# Patient Record
Sex: Female | Born: 1935 | Race: White | Hispanic: No | Smoking: Never smoker
Health system: Southern US, Community
[De-identification: ages and names within clinical notes are randomized; demographics above are authoritative.]

## PROBLEM LIST (undated history)

## (undated) DIAGNOSIS — F329 Major depressive disorder, single episode, unspecified: Secondary | ICD-10-CM

## (undated) DIAGNOSIS — I5032 Chronic diastolic (congestive) heart failure: Secondary | ICD-10-CM

## (undated) DIAGNOSIS — F32A Depression, unspecified: Secondary | ICD-10-CM

## (undated) DIAGNOSIS — K8020 Calculus of gallbladder without cholecystitis without obstruction: Secondary | ICD-10-CM

## (undated) DIAGNOSIS — G039 Meningitis, unspecified: Secondary | ICD-10-CM

## (undated) DIAGNOSIS — E785 Hyperlipidemia, unspecified: Secondary | ICD-10-CM

## (undated) DIAGNOSIS — I482 Chronic atrial fibrillation, unspecified: Secondary | ICD-10-CM

## (undated) DIAGNOSIS — G4733 Obstructive sleep apnea (adult) (pediatric): Secondary | ICD-10-CM

## (undated) DIAGNOSIS — I10 Essential (primary) hypertension: Secondary | ICD-10-CM

## (undated) DIAGNOSIS — K219 Gastro-esophageal reflux disease without esophagitis: Secondary | ICD-10-CM

## (undated) DIAGNOSIS — D509 Iron deficiency anemia, unspecified: Secondary | ICD-10-CM

## (undated) DIAGNOSIS — I2510 Atherosclerotic heart disease of native coronary artery without angina pectoris: Secondary | ICD-10-CM

## (undated) DIAGNOSIS — Z9289 Personal history of other medical treatment: Secondary | ICD-10-CM

## (undated) DIAGNOSIS — J449 Chronic obstructive pulmonary disease, unspecified: Secondary | ICD-10-CM

## (undated) DIAGNOSIS — R0902 Hypoxemia: Secondary | ICD-10-CM

## (undated) DIAGNOSIS — M109 Gout, unspecified: Secondary | ICD-10-CM

## (undated) DIAGNOSIS — B029 Zoster without complications: Secondary | ICD-10-CM

## (undated) DIAGNOSIS — Z9981 Dependence on supplemental oxygen: Secondary | ICD-10-CM

## (undated) DIAGNOSIS — I714 Abdominal aortic aneurysm, without rupture, unspecified: Secondary | ICD-10-CM

## (undated) DIAGNOSIS — Z86718 Personal history of other venous thrombosis and embolism: Secondary | ICD-10-CM

## (undated) DIAGNOSIS — N182 Chronic kidney disease, stage 2 (mild): Secondary | ICD-10-CM

## (undated) DIAGNOSIS — I2699 Other pulmonary embolism without acute cor pulmonale: Secondary | ICD-10-CM

## (undated) DIAGNOSIS — J849 Interstitial pulmonary disease, unspecified: Secondary | ICD-10-CM

## (undated) HISTORY — DX: Personal history of other venous thrombosis and embolism: Z86.718

## (undated) HISTORY — DX: Meningitis, unspecified: G03.9

## (undated) HISTORY — DX: Other pulmonary embolism without acute cor pulmonale: I26.99

## (undated) HISTORY — DX: Abdominal aortic aneurysm, without rupture: I71.4

## (undated) HISTORY — DX: Calculus of gallbladder without cholecystitis without obstruction: K80.20

## (undated) HISTORY — DX: Chronic atrial fibrillation, unspecified: I48.20

## (undated) HISTORY — DX: Gastro-esophageal reflux disease without esophagitis: K21.9

## (undated) HISTORY — DX: Essential (primary) hypertension: I10

## (undated) HISTORY — DX: Abdominal aortic aneurysm, without rupture, unspecified: I71.40

## (undated) HISTORY — DX: Morbid (severe) obesity due to excess calories: E66.01

## (undated) HISTORY — DX: Atherosclerotic heart disease of native coronary artery without angina pectoris: I25.10

## (undated) HISTORY — DX: Zoster without complications: B02.9

## (undated) HISTORY — DX: Hyperlipidemia, unspecified: E78.5

## (undated) HISTORY — PX: CATARACT EXTRACTION W/ INTRAOCULAR LENS  IMPLANT, BILATERAL: SHX1307

---
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
## 2006-05-13 DIAGNOSIS — Z86718 Personal history of other venous thrombosis and embolism: Secondary | ICD-10-CM

## 2006-05-13 DIAGNOSIS — I2699 Other pulmonary embolism without acute cor pulmonale: Secondary | ICD-10-CM

## 2006-05-13 HISTORY — DX: Other pulmonary embolism without acute cor pulmonale: I26.99

## 2006-05-13 HISTORY — DX: Personal history of other venous thrombosis and embolism: Z86.718

## 2006-07-28 ENCOUNTER — Ambulatory Visit: Payer: Self-pay | Admitting: Internal Medicine

## 2006-08-07 ENCOUNTER — Encounter: Payer: Self-pay | Admitting: Pulmonary Disease

## 2006-08-07 ENCOUNTER — Ambulatory Visit: Payer: Self-pay | Admitting: Internal Medicine

## 2006-11-01 ENCOUNTER — Ambulatory Visit: Payer: Self-pay | Admitting: *Deleted

## 2006-11-01 ENCOUNTER — Emergency Department (HOSPITAL_COMMUNITY): Admission: EM | Admit: 2006-11-01 | Discharge: 2006-11-01 | Payer: Self-pay | Admitting: Emergency Medicine

## 2006-11-02 ENCOUNTER — Ambulatory Visit: Payer: Self-pay | Admitting: Internal Medicine

## 2006-11-02 ENCOUNTER — Ambulatory Visit: Payer: Self-pay | Admitting: Cardiology

## 2006-11-02 ENCOUNTER — Inpatient Hospital Stay (HOSPITAL_COMMUNITY): Admission: EM | Admit: 2006-11-02 | Discharge: 2006-11-13 | Payer: Self-pay | Admitting: Emergency Medicine

## 2006-11-03 ENCOUNTER — Ambulatory Visit: Payer: Self-pay | Admitting: Vascular Surgery

## 2006-11-03 ENCOUNTER — Encounter (INDEPENDENT_AMBULATORY_CARE_PROVIDER_SITE_OTHER): Payer: Self-pay | Admitting: Internal Medicine

## 2006-11-04 ENCOUNTER — Encounter: Payer: Self-pay | Admitting: Cardiology

## 2006-11-17 ENCOUNTER — Ambulatory Visit: Payer: Self-pay | Admitting: Cardiovascular Disease

## 2006-11-21 ENCOUNTER — Telehealth (INDEPENDENT_AMBULATORY_CARE_PROVIDER_SITE_OTHER): Payer: Self-pay | Admitting: *Deleted

## 2006-11-21 ENCOUNTER — Ambulatory Visit: Payer: Self-pay | Admitting: Cardiology

## 2006-11-25 ENCOUNTER — Ambulatory Visit: Payer: Self-pay | Admitting: Internal Medicine

## 2006-11-25 DIAGNOSIS — G4733 Obstructive sleep apnea (adult) (pediatric): Secondary | ICD-10-CM

## 2006-11-25 DIAGNOSIS — I2699 Other pulmonary embolism without acute cor pulmonale: Secondary | ICD-10-CM

## 2006-11-25 DIAGNOSIS — I10 Essential (primary) hypertension: Secondary | ICD-10-CM

## 2006-11-25 DIAGNOSIS — I480 Paroxysmal atrial fibrillation: Secondary | ICD-10-CM

## 2006-11-25 DIAGNOSIS — T81718A Complication of other artery following a procedure, not elsewhere classified, initial encounter: Secondary | ICD-10-CM

## 2006-11-25 DIAGNOSIS — I749 Embolism and thrombosis of unspecified artery: Secondary | ICD-10-CM | POA: Insufficient documentation

## 2006-11-25 LAB — CONVERTED CEMR LAB: Prothrombin Time: 21.1 s

## 2006-11-26 DIAGNOSIS — Z87898 Personal history of other specified conditions: Secondary | ICD-10-CM

## 2006-11-26 DIAGNOSIS — Z8669 Personal history of other diseases of the nervous system and sense organs: Secondary | ICD-10-CM | POA: Insufficient documentation

## 2006-11-27 ENCOUNTER — Ambulatory Visit: Payer: Self-pay | Admitting: Cardiology

## 2006-12-03 ENCOUNTER — Ambulatory Visit: Payer: Self-pay | Admitting: Cardiology

## 2006-12-03 ENCOUNTER — Ambulatory Visit: Payer: Self-pay | Admitting: *Deleted

## 2006-12-03 LAB — CONVERTED CEMR LAB: INR: 2.2

## 2006-12-09 ENCOUNTER — Ambulatory Visit: Payer: Self-pay | Admitting: *Deleted

## 2006-12-09 LAB — CONVERTED CEMR LAB
INR: 1.8
Prothrombin Time: 16.7 s

## 2006-12-16 ENCOUNTER — Ambulatory Visit: Payer: Self-pay | Admitting: Internal Medicine

## 2006-12-16 LAB — CONVERTED CEMR LAB: Prothrombin Time: 15.1 s

## 2006-12-22 ENCOUNTER — Ambulatory Visit: Payer: Self-pay | Admitting: Cardiology

## 2006-12-23 ENCOUNTER — Ambulatory Visit: Payer: Self-pay | Admitting: Internal Medicine

## 2006-12-24 LAB — CONVERTED CEMR LAB: INR: 2.8

## 2006-12-26 LAB — CONVERTED CEMR LAB
Bilirubin Urine: NEGATIVE
Creatinine, Ser: 1.1 mg/dL (ref 0.4–1.2)
Crystals: NEGATIVE
GFR calc Af Amer: 63 mL/min
GFR calc non Af Amer: 52 mL/min
Hemoglobin, Urine: NEGATIVE
Ketones, ur: NEGATIVE mg/dL
pH: 6 (ref 5.0–8.0)

## 2006-12-30 ENCOUNTER — Ambulatory Visit: Payer: Self-pay | Admitting: *Deleted

## 2006-12-30 ENCOUNTER — Ambulatory Visit: Payer: Self-pay | Admitting: Family Medicine

## 2006-12-30 LAB — CONVERTED CEMR LAB: Prothrombin Time: 19.9 s

## 2007-01-06 ENCOUNTER — Ambulatory Visit: Payer: Self-pay | Admitting: *Deleted

## 2007-01-06 LAB — CONVERTED CEMR LAB: Prothrombin Time: 20.4 s

## 2007-01-07 ENCOUNTER — Telehealth (INDEPENDENT_AMBULATORY_CARE_PROVIDER_SITE_OTHER): Payer: Self-pay | Admitting: *Deleted

## 2007-01-12 HISTORY — PX: CARDIOVERSION: SHX1299

## 2007-01-19 ENCOUNTER — Ambulatory Visit: Payer: Self-pay | Admitting: *Deleted

## 2007-01-19 LAB — CONVERTED CEMR LAB
INR: 2.3
Prothrombin Time: 18.5 s

## 2007-01-26 ENCOUNTER — Ambulatory Visit: Payer: Self-pay | Admitting: *Deleted

## 2007-01-26 LAB — CONVERTED CEMR LAB: Prothrombin Time: 20.9 s

## 2007-02-02 ENCOUNTER — Ambulatory Visit: Payer: Self-pay | Admitting: *Deleted

## 2007-02-02 LAB — CONVERTED CEMR LAB
INR: 2.4
Prothrombin Time: 18.7 s

## 2007-02-04 ENCOUNTER — Ambulatory Visit (HOSPITAL_COMMUNITY): Admission: RE | Admit: 2007-02-04 | Discharge: 2007-02-04 | Payer: Self-pay | Admitting: Cardiology

## 2007-02-04 ENCOUNTER — Ambulatory Visit: Payer: Self-pay | Admitting: Cardiology

## 2007-02-09 ENCOUNTER — Ambulatory Visit: Payer: Self-pay | Admitting: *Deleted

## 2007-02-09 LAB — CONVERTED CEMR LAB: INR: 2.4

## 2007-02-20 ENCOUNTER — Ambulatory Visit: Payer: Self-pay | Admitting: Cardiology

## 2007-02-23 ENCOUNTER — Ambulatory Visit: Payer: Self-pay | Admitting: Internal Medicine

## 2007-03-03 ENCOUNTER — Ambulatory Visit: Payer: Self-pay | Admitting: Internal Medicine

## 2007-03-23 ENCOUNTER — Ambulatory Visit: Payer: Self-pay | Admitting: *Deleted

## 2007-03-23 LAB — CONVERTED CEMR LAB: Prothrombin Time: 17.5 s

## 2007-04-06 ENCOUNTER — Ambulatory Visit: Payer: Self-pay | Admitting: *Deleted

## 2007-04-06 LAB — CONVERTED CEMR LAB: INR: 3.9

## 2007-04-20 ENCOUNTER — Ambulatory Visit: Payer: Self-pay | Admitting: *Deleted

## 2007-04-20 LAB — CONVERTED CEMR LAB
INR: 2.5
Prothrombin Time: 19.3 s

## 2007-04-29 ENCOUNTER — Ambulatory Visit: Payer: Self-pay | Admitting: Internal Medicine

## 2007-05-18 ENCOUNTER — Ambulatory Visit: Payer: Self-pay | Admitting: *Deleted

## 2007-05-18 LAB — CONVERTED CEMR LAB
INR: 4.3
Prothrombin Time: 25 s

## 2007-05-27 ENCOUNTER — Ambulatory Visit: Payer: Self-pay | Admitting: Internal Medicine

## 2007-05-28 LAB — CONVERTED CEMR LAB: Prothrombin Time: 18.7 s — ABNORMAL HIGH (ref 10.9–13.3)

## 2007-06-01 ENCOUNTER — Telehealth (INDEPENDENT_AMBULATORY_CARE_PROVIDER_SITE_OTHER): Payer: Self-pay | Admitting: *Deleted

## 2007-06-01 ENCOUNTER — Ambulatory Visit: Payer: Self-pay | Admitting: Cardiology

## 2007-06-03 ENCOUNTER — Ambulatory Visit: Payer: Self-pay | Admitting: Cardiology

## 2007-06-03 ENCOUNTER — Ambulatory Visit: Payer: Self-pay | Admitting: Internal Medicine

## 2007-06-03 ENCOUNTER — Ambulatory Visit: Payer: Self-pay

## 2007-06-03 DIAGNOSIS — F322 Major depressive disorder, single episode, severe without psychotic features: Secondary | ICD-10-CM | POA: Insufficient documentation

## 2007-06-11 ENCOUNTER — Ambulatory Visit: Payer: Self-pay | Admitting: Internal Medicine

## 2007-06-11 LAB — CONVERTED CEMR LAB: INR: 5.1

## 2007-06-15 ENCOUNTER — Ambulatory Visit: Payer: Self-pay | Admitting: Ophthalmology

## 2007-06-24 ENCOUNTER — Ambulatory Visit: Payer: Self-pay | Admitting: Internal Medicine

## 2007-06-26 ENCOUNTER — Ambulatory Visit: Payer: Self-pay | Admitting: Internal Medicine

## 2007-06-26 LAB — CONVERTED CEMR LAB: Prothrombin Time: 31.4 s

## 2007-06-29 ENCOUNTER — Ambulatory Visit: Payer: Self-pay | Admitting: Internal Medicine

## 2007-06-29 LAB — CONVERTED CEMR LAB
INR: 4.6 — ABNORMAL HIGH (ref 0.8–1.0)
Prothrombin Time: 27.5 s — ABNORMAL HIGH (ref 10.9–13.3)
Prothrombin Time: 17.5 s

## 2007-06-30 ENCOUNTER — Ambulatory Visit: Payer: Self-pay | Admitting: Ophthalmology

## 2007-07-06 ENCOUNTER — Ambulatory Visit: Payer: Self-pay | Admitting: Internal Medicine

## 2007-07-06 LAB — CONVERTED CEMR LAB
INR: 2.5
Prothrombin Time: 19.2 s

## 2007-07-17 ENCOUNTER — Encounter (INDEPENDENT_AMBULATORY_CARE_PROVIDER_SITE_OTHER): Payer: Self-pay | Admitting: *Deleted

## 2007-07-29 ENCOUNTER — Ambulatory Visit: Payer: Self-pay | Admitting: *Deleted

## 2007-08-05 ENCOUNTER — Ambulatory Visit: Payer: Self-pay | Admitting: Internal Medicine

## 2007-08-12 ENCOUNTER — Ambulatory Visit: Payer: Self-pay | Admitting: Internal Medicine

## 2007-08-12 LAB — CONVERTED CEMR LAB: Prothrombin Time: 24.1 s

## 2007-08-19 ENCOUNTER — Ambulatory Visit: Payer: Self-pay | Admitting: Internal Medicine

## 2007-08-19 LAB — CONVERTED CEMR LAB: INR: 3.3

## 2007-08-27 ENCOUNTER — Ambulatory Visit: Payer: Self-pay | Admitting: Cardiology

## 2007-09-02 ENCOUNTER — Ambulatory Visit: Payer: Self-pay | Admitting: Internal Medicine

## 2007-09-02 LAB — CONVERTED CEMR LAB: INR: 2.9

## 2007-10-01 ENCOUNTER — Ambulatory Visit: Payer: Self-pay | Admitting: Internal Medicine

## 2007-10-01 LAB — CONVERTED CEMR LAB
INR: 4.5
Prothrombin Time: 25.6 s

## 2007-10-07 ENCOUNTER — Ambulatory Visit: Payer: Self-pay | Admitting: Internal Medicine

## 2007-10-07 LAB — CONVERTED CEMR LAB: Prothrombin Time: 21.6 s

## 2007-10-21 ENCOUNTER — Ambulatory Visit: Payer: Self-pay | Admitting: *Deleted

## 2007-10-28 ENCOUNTER — Ambulatory Visit: Payer: Self-pay | Admitting: Internal Medicine

## 2007-10-30 LAB — CONVERTED CEMR LAB

## 2007-11-04 ENCOUNTER — Ambulatory Visit: Payer: Self-pay | Admitting: Internal Medicine

## 2007-11-04 LAB — CONVERTED CEMR LAB
INR: 3.1
Prothrombin Time: 21.3 s

## 2007-12-02 ENCOUNTER — Ambulatory Visit: Payer: Self-pay | Admitting: Family Medicine

## 2007-12-02 DIAGNOSIS — J309 Allergic rhinitis, unspecified: Secondary | ICD-10-CM

## 2007-12-04 ENCOUNTER — Ambulatory Visit: Payer: Self-pay | Admitting: Family Medicine

## 2007-12-04 LAB — CONVERTED CEMR LAB: Prothrombin Time: 24 s

## 2007-12-18 ENCOUNTER — Ambulatory Visit: Payer: Self-pay | Admitting: Family Medicine

## 2007-12-18 LAB — CONVERTED CEMR LAB
INR: 2.5
Prothrombin Time: 19.3 s

## 2008-01-11 ENCOUNTER — Encounter: Payer: Self-pay | Admitting: Family Medicine

## 2008-01-13 ENCOUNTER — Ambulatory Visit: Payer: Self-pay | Admitting: Family Medicine

## 2008-02-08 ENCOUNTER — Telehealth: Payer: Self-pay | Admitting: Internal Medicine

## 2008-02-10 ENCOUNTER — Ambulatory Visit: Payer: Self-pay | Admitting: Family Medicine

## 2008-02-10 LAB — CONVERTED CEMR LAB
INR: 1.8
Prothrombin Time: 16.6 s

## 2008-02-17 ENCOUNTER — Telehealth: Payer: Self-pay | Admitting: Internal Medicine

## 2008-03-02 ENCOUNTER — Ambulatory Visit: Payer: Self-pay | Admitting: Family Medicine

## 2008-03-02 LAB — CONVERTED CEMR LAB: INR: 3.2

## 2008-03-09 ENCOUNTER — Ambulatory Visit: Payer: Self-pay | Admitting: Family Medicine

## 2008-03-09 DIAGNOSIS — B0229 Other postherpetic nervous system involvement: Secondary | ICD-10-CM

## 2008-03-09 LAB — CONVERTED CEMR LAB
ALT: 32 units/L (ref 0–35)
AST: 26 units/L (ref 0–37)
Albumin: 3.5 g/dL (ref 3.5–5.2)
Alkaline Phosphatase: 80 units/L (ref 39–117)
BUN: 19 mg/dL (ref 6–23)
Chloride: 104 meq/L (ref 96–112)
Cholesterol: 137 mg/dL (ref 0–200)
Glucose, Bld: 90 mg/dL (ref 70–99)
Potassium: 4.3 meq/L (ref 3.5–5.1)
Total Protein: 7.1 g/dL (ref 6.0–8.3)
Triglycerides: 95 mg/dL (ref 0–149)
VLDL: 19 mg/dL (ref 0–40)

## 2008-03-22 ENCOUNTER — Ambulatory Visit: Payer: Self-pay | Admitting: Family Medicine

## 2008-03-25 ENCOUNTER — Encounter (INDEPENDENT_AMBULATORY_CARE_PROVIDER_SITE_OTHER): Payer: Self-pay | Admitting: *Deleted

## 2008-04-01 ENCOUNTER — Ambulatory Visit: Payer: Self-pay | Admitting: Family Medicine

## 2008-04-04 ENCOUNTER — Encounter: Payer: Self-pay | Admitting: Internal Medicine

## 2008-04-14 ENCOUNTER — Ambulatory Visit: Payer: Self-pay | Admitting: Family Medicine

## 2008-04-29 ENCOUNTER — Ambulatory Visit: Payer: Self-pay | Admitting: Family Medicine

## 2008-05-04 ENCOUNTER — Ambulatory Visit: Payer: Self-pay | Admitting: Family Medicine

## 2008-05-09 ENCOUNTER — Telehealth: Payer: Self-pay | Admitting: Family Medicine

## 2008-05-10 ENCOUNTER — Telehealth (INDEPENDENT_AMBULATORY_CARE_PROVIDER_SITE_OTHER): Payer: Self-pay | Admitting: Internal Medicine

## 2008-05-11 ENCOUNTER — Telehealth: Payer: Self-pay | Admitting: Family Medicine

## 2008-05-17 ENCOUNTER — Encounter: Payer: Self-pay | Admitting: Family Medicine

## 2008-05-18 ENCOUNTER — Ambulatory Visit: Payer: Self-pay | Admitting: Family Medicine

## 2008-05-27 ENCOUNTER — Ambulatory Visit: Payer: Self-pay | Admitting: Family Medicine

## 2008-05-27 LAB — CONVERTED CEMR LAB
INR: 4.3
Prothrombin Time: 25.1 s

## 2008-06-10 ENCOUNTER — Ambulatory Visit: Payer: Self-pay | Admitting: Family Medicine

## 2008-06-10 LAB — CONVERTED CEMR LAB
INR: 3.6
Prothrombin Time: 23 s

## 2008-06-24 ENCOUNTER — Ambulatory Visit: Payer: Self-pay | Admitting: Family Medicine

## 2008-06-24 LAB — CONVERTED CEMR LAB: Prothrombin Time: 22.8 s

## 2008-07-04 ENCOUNTER — Ambulatory Visit: Payer: Self-pay | Admitting: Family Medicine

## 2008-07-20 ENCOUNTER — Ambulatory Visit: Payer: Self-pay | Admitting: Internal Medicine

## 2008-07-22 ENCOUNTER — Encounter: Payer: Self-pay | Admitting: Internal Medicine

## 2008-07-22 ENCOUNTER — Ambulatory Visit: Payer: Self-pay | Admitting: Cardiology

## 2008-07-22 LAB — CONVERTED CEMR LAB
BUN: 20 mg/dL (ref 6–23)
Calcium: 9.2 mg/dL (ref 8.4–10.5)
Chloride: 103 meq/L (ref 96–112)
Creatinine, Ser: 1.07 mg/dL (ref 0.40–1.20)
Pro B Natriuretic peptide (BNP): 120.9 pg/mL — ABNORMAL HIGH (ref 0.0–100.0)

## 2008-07-25 ENCOUNTER — Telehealth: Payer: Self-pay | Admitting: Family Medicine

## 2008-07-27 ENCOUNTER — Ambulatory Visit: Payer: Self-pay | Admitting: Internal Medicine

## 2008-07-27 LAB — CONVERTED CEMR LAB
Calcium: 9.3 mg/dL (ref 8.4–10.5)
Glucose, Bld: 98 mg/dL (ref 70–99)
Pro B Natriuretic peptide (BNP): 89.6 pg/mL (ref 0.0–100.0)
Sodium: 141 meq/L (ref 135–145)

## 2008-07-28 ENCOUNTER — Ambulatory Visit: Payer: Self-pay | Admitting: Internal Medicine

## 2008-07-28 ENCOUNTER — Encounter: Payer: Self-pay | Admitting: Internal Medicine

## 2008-08-03 ENCOUNTER — Ambulatory Visit: Payer: Self-pay | Admitting: Cardiology

## 2008-08-03 LAB — CONVERTED CEMR LAB
Chloride: 101 meq/L (ref 96–112)
Creatinine, Ser: 1.22 mg/dL — ABNORMAL HIGH (ref 0.40–1.20)

## 2008-08-05 ENCOUNTER — Ambulatory Visit: Payer: Self-pay | Admitting: Family Medicine

## 2008-08-05 ENCOUNTER — Telehealth: Payer: Self-pay | Admitting: Family Medicine

## 2008-08-05 LAB — CONVERTED CEMR LAB
INR: 3.1
Prothrombin Time: 21.3 s

## 2008-08-24 ENCOUNTER — Ambulatory Visit: Payer: Self-pay | Admitting: Internal Medicine

## 2008-08-24 ENCOUNTER — Encounter: Payer: Self-pay | Admitting: Internal Medicine

## 2008-09-02 ENCOUNTER — Ambulatory Visit: Payer: Self-pay | Admitting: Family Medicine

## 2008-09-02 LAB — CONVERTED CEMR LAB
INR: 4.6
Prothrombin Time: 26.1 s

## 2008-09-08 ENCOUNTER — Encounter: Payer: Self-pay | Admitting: Internal Medicine

## 2008-09-08 ENCOUNTER — Ambulatory Visit: Payer: Self-pay | Admitting: Cardiology

## 2008-09-08 LAB — CONVERTED CEMR LAB
BUN: 18 mg/dL (ref 6–23)
Calcium: 9.2 mg/dL (ref 8.4–10.5)
Glucose, Bld: 93 mg/dL (ref 70–99)
Pro B Natriuretic peptide (BNP): 155 pg/mL — ABNORMAL HIGH (ref 0.0–100.0)
Sodium: 141 meq/L (ref 135–145)

## 2008-09-16 ENCOUNTER — Ambulatory Visit: Payer: Self-pay | Admitting: Family Medicine

## 2008-09-30 ENCOUNTER — Ambulatory Visit: Payer: Self-pay | Admitting: Family Medicine

## 2008-10-03 LAB — CONVERTED CEMR LAB: Prothrombin Time: 22.8 s

## 2008-10-14 ENCOUNTER — Ambulatory Visit: Payer: Self-pay | Admitting: Family Medicine

## 2008-11-18 ENCOUNTER — Ambulatory Visit: Payer: Self-pay | Admitting: Family Medicine

## 2008-11-18 LAB — CONVERTED CEMR LAB
INR: 2.1
Prothrombin Time: 17.8 s

## 2008-12-01 ENCOUNTER — Ambulatory Visit: Payer: Self-pay | Admitting: Internal Medicine

## 2008-12-01 ENCOUNTER — Encounter (INDEPENDENT_AMBULATORY_CARE_PROVIDER_SITE_OTHER): Payer: Self-pay | Admitting: *Deleted

## 2008-12-05 ENCOUNTER — Ambulatory Visit: Payer: Self-pay | Admitting: Internal Medicine

## 2008-12-05 ENCOUNTER — Inpatient Hospital Stay (HOSPITAL_BASED_OUTPATIENT_CLINIC_OR_DEPARTMENT_OTHER): Admission: RE | Admit: 2008-12-05 | Discharge: 2008-12-05 | Payer: Self-pay | Admitting: Internal Medicine

## 2008-12-07 LAB — CONVERTED CEMR LAB
BUN: 20 mg/dL (ref 6–23)
Chloride: 103 meq/L (ref 96–112)
Glucose, Bld: 90 mg/dL (ref 70–99)
Hemoglobin: 15.9 g/dL — ABNORMAL HIGH (ref 12.0–15.0)
Potassium: 4.7 meq/L (ref 3.5–5.3)
RBC: 4.75 M/uL (ref 3.87–5.11)
WBC: 12.3 10*3/uL — ABNORMAL HIGH (ref 4.0–10.5)

## 2008-12-23 ENCOUNTER — Ambulatory Visit: Payer: Self-pay | Admitting: Internal Medicine

## 2008-12-23 ENCOUNTER — Telehealth (INDEPENDENT_AMBULATORY_CARE_PROVIDER_SITE_OTHER): Payer: Self-pay | Admitting: *Deleted

## 2008-12-23 DIAGNOSIS — I714 Abdominal aortic aneurysm, without rupture: Secondary | ICD-10-CM

## 2008-12-28 ENCOUNTER — Ambulatory Visit: Payer: Self-pay | Admitting: Family Medicine

## 2009-01-04 ENCOUNTER — Ambulatory Visit: Payer: Self-pay | Admitting: Family Medicine

## 2009-01-04 ENCOUNTER — Telehealth: Payer: Self-pay | Admitting: Internal Medicine

## 2009-01-04 LAB — CONVERTED CEMR LAB
INR: 3.9
Prothrombin Time: 24 s

## 2009-01-10 ENCOUNTER — Encounter: Payer: Self-pay | Admitting: Internal Medicine

## 2009-01-10 ENCOUNTER — Ambulatory Visit: Payer: Self-pay

## 2009-01-10 ENCOUNTER — Ambulatory Visit: Payer: Self-pay | Admitting: Cardiology

## 2009-01-11 ENCOUNTER — Ambulatory Visit: Payer: Self-pay | Admitting: Family Medicine

## 2009-01-11 LAB — CONVERTED CEMR LAB: INR: 2.6

## 2009-01-18 ENCOUNTER — Ambulatory Visit: Payer: Self-pay | Admitting: Family Medicine

## 2009-01-18 LAB — CONVERTED CEMR LAB: Prothrombin Time: 18.5 s

## 2009-01-25 ENCOUNTER — Ambulatory Visit: Payer: Self-pay | Admitting: Family Medicine

## 2009-01-25 LAB — CONVERTED CEMR LAB: INR: 2.6

## 2009-02-02 ENCOUNTER — Ambulatory Visit: Payer: Self-pay | Admitting: Internal Medicine

## 2009-02-02 ENCOUNTER — Encounter: Payer: Self-pay | Admitting: Internal Medicine

## 2009-02-02 DIAGNOSIS — I5032 Chronic diastolic (congestive) heart failure: Secondary | ICD-10-CM

## 2009-02-06 LAB — CONVERTED CEMR LAB
CO2: 22 meq/L (ref 19–32)
Chloride: 106 meq/L (ref 96–112)
Creatinine, Ser: 0.97 mg/dL (ref 0.40–1.20)
HCT: 45.7 % (ref 36.0–46.0)
Hemoglobin: 15.3 g/dL — ABNORMAL HIGH (ref 12.0–15.0)
MCHC: 33.5 g/dL (ref 30.0–36.0)
Potassium: 4.5 meq/L (ref 3.5–5.3)
RBC: 4.53 M/uL (ref 3.87–5.11)
RDW: 14.8 % (ref 11.5–15.5)
aPTT: 38 s — ABNORMAL HIGH (ref 24–37)

## 2009-02-08 ENCOUNTER — Encounter: Payer: Self-pay | Admitting: Internal Medicine

## 2009-02-08 ENCOUNTER — Telehealth: Payer: Self-pay | Admitting: Internal Medicine

## 2009-02-08 ENCOUNTER — Ambulatory Visit: Payer: Self-pay | Admitting: Internal Medicine

## 2009-02-10 ENCOUNTER — Ambulatory Visit: Payer: Self-pay | Admitting: Internal Medicine

## 2009-02-10 ENCOUNTER — Inpatient Hospital Stay (HOSPITAL_COMMUNITY): Admission: AD | Admit: 2009-02-10 | Discharge: 2009-02-15 | Payer: Self-pay | Admitting: Internal Medicine

## 2009-02-10 ENCOUNTER — Encounter: Payer: Self-pay | Admitting: Internal Medicine

## 2009-02-14 ENCOUNTER — Encounter: Payer: Self-pay | Admitting: Internal Medicine

## 2009-02-17 ENCOUNTER — Ambulatory Visit: Payer: Self-pay | Admitting: Cardiovascular Disease

## 2009-02-17 ENCOUNTER — Encounter: Payer: Self-pay | Admitting: Internal Medicine

## 2009-02-24 ENCOUNTER — Ambulatory Visit: Payer: Self-pay | Admitting: Internal Medicine

## 2009-02-24 ENCOUNTER — Telehealth: Payer: Self-pay | Admitting: Family Medicine

## 2009-03-01 LAB — CONVERTED CEMR LAB
CO2: 20 meq/L (ref 19–32)
Calcium: 9.5 mg/dL (ref 8.4–10.5)
Chloride: 105 meq/L (ref 96–112)
Glucose, Bld: 107 mg/dL — ABNORMAL HIGH (ref 70–99)
Sodium: 141 meq/L (ref 135–145)

## 2009-03-02 ENCOUNTER — Telehealth: Payer: Self-pay | Admitting: Family Medicine

## 2009-03-02 ENCOUNTER — Ambulatory Visit: Payer: Self-pay | Admitting: Internal Medicine

## 2009-03-10 ENCOUNTER — Ambulatory Visit: Payer: Self-pay | Admitting: Family Medicine

## 2009-03-10 LAB — CONVERTED CEMR LAB: INR: 1.8

## 2009-03-13 ENCOUNTER — Encounter (INDEPENDENT_AMBULATORY_CARE_PROVIDER_SITE_OTHER): Payer: Self-pay | Admitting: *Deleted

## 2009-03-13 LAB — CONVERTED CEMR LAB
CO2: 29 meq/L (ref 19–32)
Calcium: 9.4 mg/dL (ref 8.4–10.5)
Chloride: 99 meq/L (ref 96–112)
Glucose, Bld: 77 mg/dL (ref 70–99)
Potassium: 5.2 meq/L — ABNORMAL HIGH (ref 3.5–5.1)
Sodium: 141 meq/L (ref 135–145)

## 2009-03-24 ENCOUNTER — Ambulatory Visit: Payer: Self-pay | Admitting: Family Medicine

## 2009-03-24 LAB — CONVERTED CEMR LAB
INR: 2.4
Prothrombin Time: 18.8 s

## 2009-03-27 LAB — CONVERTED CEMR LAB
CO2: 32 meq/L (ref 19–32)
Chloride: 103 meq/L (ref 96–112)
Potassium: 5.1 meq/L (ref 3.5–5.1)
Sodium: 144 meq/L (ref 135–145)

## 2009-04-04 ENCOUNTER — Ambulatory Visit: Payer: Self-pay | Admitting: Internal Medicine

## 2009-04-10 LAB — CONVERTED CEMR LAB
Calcium: 9.6 mg/dL (ref 8.4–10.5)
Chloride: 94 meq/L — ABNORMAL LOW (ref 96–112)
Creatinine, Ser: 1.3 mg/dL — ABNORMAL HIGH (ref 0.40–1.20)
Potassium: 3.3 meq/L — ABNORMAL LOW (ref 3.5–5.3)
Sodium: 139 meq/L (ref 135–145)

## 2009-04-21 ENCOUNTER — Ambulatory Visit: Payer: Self-pay | Admitting: Family Medicine

## 2009-04-21 LAB — CONVERTED CEMR LAB
INR: 2.2
Prothrombin Time: 18.2 s

## 2009-04-24 LAB — CONVERTED CEMR LAB
CO2: 32 meq/L (ref 19–32)
Calcium: 9.3 mg/dL (ref 8.4–10.5)
Creatinine, Ser: 1.1 mg/dL (ref 0.4–1.2)

## 2009-05-19 ENCOUNTER — Ambulatory Visit: Payer: Self-pay | Admitting: Family Medicine

## 2009-06-16 ENCOUNTER — Ambulatory Visit: Payer: Self-pay | Admitting: Family Medicine

## 2009-06-16 LAB — CONVERTED CEMR LAB

## 2009-06-28 ENCOUNTER — Observation Stay (HOSPITAL_COMMUNITY): Admission: EM | Admit: 2009-06-28 | Discharge: 2009-06-29 | Payer: Self-pay | Admitting: Emergency Medicine

## 2009-06-28 ENCOUNTER — Telehealth: Payer: Self-pay | Admitting: Family Medicine

## 2009-06-28 ENCOUNTER — Ambulatory Visit: Payer: Self-pay | Admitting: Internal Medicine

## 2009-07-21 ENCOUNTER — Ambulatory Visit: Payer: Self-pay | Admitting: Internal Medicine

## 2009-07-21 ENCOUNTER — Encounter: Payer: Self-pay | Admitting: Internal Medicine

## 2009-08-18 ENCOUNTER — Ambulatory Visit: Payer: Self-pay | Admitting: Family Medicine

## 2009-08-18 LAB — CONVERTED CEMR LAB: INR: 3.5

## 2009-08-31 ENCOUNTER — Ambulatory Visit: Payer: Self-pay | Admitting: Family Medicine

## 2009-08-31 LAB — CONVERTED CEMR LAB
INR: 3.3
Prothrombin Time: 39.6 s

## 2009-09-27 ENCOUNTER — Ambulatory Visit: Payer: Self-pay | Admitting: Family Medicine

## 2009-09-27 LAB — CONVERTED CEMR LAB
INR: 6.2
Prothrombin Time: 74.7 s

## 2009-10-03 ENCOUNTER — Encounter: Payer: Self-pay | Admitting: Family Medicine

## 2009-10-17 ENCOUNTER — Encounter: Payer: Self-pay | Admitting: Family Medicine

## 2009-10-31 ENCOUNTER — Encounter: Payer: Self-pay | Admitting: Family Medicine

## 2009-10-31 ENCOUNTER — Encounter (INDEPENDENT_AMBULATORY_CARE_PROVIDER_SITE_OTHER): Payer: Self-pay | Admitting: *Deleted

## 2009-11-15 ENCOUNTER — Encounter: Payer: Self-pay | Admitting: Family Medicine

## 2009-11-15 ENCOUNTER — Encounter (INDEPENDENT_AMBULATORY_CARE_PROVIDER_SITE_OTHER): Payer: Self-pay | Admitting: *Deleted

## 2009-11-29 ENCOUNTER — Encounter (INDEPENDENT_AMBULATORY_CARE_PROVIDER_SITE_OTHER): Payer: Self-pay | Admitting: *Deleted

## 2009-11-30 ENCOUNTER — Encounter: Payer: Self-pay | Admitting: Family Medicine

## 2010-01-05 ENCOUNTER — Encounter: Payer: Self-pay | Admitting: Cardiovascular Disease

## 2010-01-05 ENCOUNTER — Ambulatory Visit: Payer: Self-pay | Admitting: Cardiovascular Disease

## 2010-01-19 ENCOUNTER — Ambulatory Visit: Payer: Self-pay | Admitting: Family Medicine

## 2010-01-22 LAB — CONVERTED CEMR LAB
BUN: 33 mg/dL — ABNORMAL HIGH (ref 6–23)
Calcium: 9.5 mg/dL (ref 8.4–10.5)
Creatinine, Ser: 1.37 mg/dL — ABNORMAL HIGH (ref 0.40–1.20)
Potassium: 3.6 meq/L (ref 3.5–5.3)

## 2010-01-24 ENCOUNTER — Encounter: Payer: Self-pay | Admitting: Family Medicine

## 2010-01-24 ENCOUNTER — Telehealth: Payer: Self-pay | Admitting: Family Medicine

## 2010-01-24 ENCOUNTER — Inpatient Hospital Stay: Payer: Self-pay | Admitting: Surgery

## 2010-01-24 ENCOUNTER — Encounter: Payer: Self-pay | Admitting: Internal Medicine

## 2010-01-30 ENCOUNTER — Encounter: Payer: Self-pay | Admitting: Family Medicine

## 2010-01-30 ENCOUNTER — Encounter: Payer: Self-pay | Admitting: Cardiovascular Disease

## 2010-02-01 ENCOUNTER — Encounter: Payer: Self-pay | Admitting: Family Medicine

## 2010-02-01 ENCOUNTER — Observation Stay: Payer: Self-pay | Admitting: Internal Medicine

## 2010-02-02 ENCOUNTER — Encounter: Payer: Self-pay | Admitting: Family Medicine

## 2010-02-03 ENCOUNTER — Encounter: Payer: Self-pay | Admitting: Family Medicine

## 2010-02-05 ENCOUNTER — Encounter: Payer: Self-pay | Admitting: Family Medicine

## 2010-02-05 ENCOUNTER — Encounter: Payer: Self-pay | Admitting: Internal Medicine

## 2010-02-06 ENCOUNTER — Ambulatory Visit: Payer: Self-pay | Admitting: Internal Medicine

## 2010-02-06 ENCOUNTER — Encounter: Payer: Self-pay | Admitting: Family Medicine

## 2010-02-10 ENCOUNTER — Encounter: Payer: Self-pay | Admitting: Internal Medicine

## 2010-02-13 ENCOUNTER — Ambulatory Visit: Payer: Self-pay | Admitting: Internal Medicine

## 2010-02-16 ENCOUNTER — Telehealth: Payer: Self-pay | Admitting: Family Medicine

## 2010-02-20 ENCOUNTER — Telehealth: Payer: Self-pay | Admitting: Family Medicine

## 2010-02-23 ENCOUNTER — Telehealth: Payer: Self-pay | Admitting: Family Medicine

## 2010-02-23 ENCOUNTER — Encounter: Payer: Self-pay | Admitting: Family Medicine

## 2010-02-23 ENCOUNTER — Ambulatory Visit: Payer: Self-pay | Admitting: Surgery

## 2010-03-02 ENCOUNTER — Ambulatory Visit: Payer: Self-pay | Admitting: Family Medicine

## 2010-03-02 LAB — CONVERTED CEMR LAB
INR: 1.2
Prothrombin Time: 14.6 s

## 2010-03-13 ENCOUNTER — Ambulatory Visit: Payer: Self-pay | Admitting: Family Medicine

## 2010-03-13 ENCOUNTER — Ambulatory Visit: Payer: Self-pay | Admitting: Internal Medicine

## 2010-03-13 LAB — CONVERTED CEMR LAB: Prothrombin Time: 31.2 s

## 2010-03-16 ENCOUNTER — Encounter: Payer: Self-pay | Admitting: Family Medicine

## 2010-03-26 ENCOUNTER — Encounter: Payer: Self-pay | Admitting: Family Medicine

## 2010-03-26 ENCOUNTER — Telehealth (INDEPENDENT_AMBULATORY_CARE_PROVIDER_SITE_OTHER): Payer: Self-pay | Admitting: *Deleted

## 2010-03-29 ENCOUNTER — Ambulatory Visit: Payer: Self-pay | Admitting: Surgery

## 2010-04-04 ENCOUNTER — Ambulatory Visit: Payer: Self-pay | Admitting: Surgery

## 2010-04-06 ENCOUNTER — Ambulatory Visit: Payer: Self-pay | Admitting: Cardiology

## 2010-04-06 ENCOUNTER — Inpatient Hospital Stay: Payer: Self-pay | Admitting: Internal Medicine

## 2010-04-07 ENCOUNTER — Encounter: Payer: Self-pay | Admitting: Cardiovascular Disease

## 2010-04-12 ENCOUNTER — Ambulatory Visit: Payer: Self-pay | Admitting: Internal Medicine

## 2010-04-12 ENCOUNTER — Encounter: Payer: Self-pay | Admitting: Family Medicine

## 2010-04-17 ENCOUNTER — Encounter: Payer: Self-pay | Admitting: Family Medicine

## 2010-04-18 ENCOUNTER — Encounter: Payer: Self-pay | Admitting: Family Medicine

## 2010-04-18 ENCOUNTER — Telehealth: Payer: Self-pay | Admitting: Family Medicine

## 2010-04-22 ENCOUNTER — Encounter: Payer: Self-pay | Admitting: Family Medicine

## 2010-04-23 ENCOUNTER — Telehealth: Payer: Self-pay | Admitting: Family Medicine

## 2010-04-30 ENCOUNTER — Encounter: Payer: Self-pay | Admitting: Family Medicine

## 2010-04-30 ENCOUNTER — Telehealth (INDEPENDENT_AMBULATORY_CARE_PROVIDER_SITE_OTHER): Payer: Self-pay | Admitting: *Deleted

## 2010-05-02 ENCOUNTER — Encounter: Payer: Self-pay | Admitting: Family Medicine

## 2010-06-08 ENCOUNTER — Telehealth: Payer: Self-pay | Admitting: Family Medicine

## 2010-06-13 NOTE — Letter (Signed)
Summary: Generic Letter  [facility]  [address] [place], [place] [address]   Phone: [phone]  Fax: [phone]    11/15/2009  [facility] [place] [address] [place], [place]  [address]  Dear Ms. Guiffre,      Please draw Protime on patient today 11/15/09 (dx code 427.31 Atrial Fib) Fax results to [phone].     Thank You,    Kerby Nora MD    Liane Comber CMA (AAMA)

## 2010-06-13 NOTE — Progress Notes (Signed)
Summary: PT results  Phone Note From Other Clinic   Caller: Larita Fife with [facility] [phone] Summary of Call: Nurse called with INR/PT results from yesterday.  INR 1.1, PT 13.8. Initial call taken by: Lowella Petties CMA,  February 23, 2010 9:42 AM  Follow-up for Phone Call        Please ask pt below questions and put answers beside questions.  Also verify below regimen that this is what she is taking.  Coumadin dose missed/changed:  Patient was off for operation Abnormal Bleeding Symptoms: no Any diet changes including alcohol intake, vegetables or greens since the last visit:  yes no salt Any illnesses or hospitalizations since the last visit: yes  Any signs of clotting since the last visit:no  Previous INR:5mg  sat,sunday 2.5 everyother day Previous Regimen:  Follow-up by: Kerby Nora MD,  February 23, 2010 9:49 AM  Additional Follow-up for Phone Call Additional follow up Details #1::        Left message for nurse to return my call  havent heard from nurse but i do know that patient has been recently hospitalized and also was in [facility] for rehab Additional Follow-up by: Benny Lennert CMA Duncan Dull),  February 26, 2010 11:43 AM    Additional Follow-up for Phone Call Additional follow up Details #2::    That is exactly why I need more info.Marland KitchenMarland KitchenI don't know what dose she is on, reent changes etc. Keep calling or call pt directly. Follow-up by: Kerby Nora MD,  February 27, 2010 1:52 PM  Additional Follow-up for Phone Call Additional follow up Details #3:: Details for Additional Follow-up Action Taken: Patient says that she wants to get her pt/inr checked when she comes in on friday to see you Additional Follow-up by: Benny Lennert CMA Duncan Dull),  February 27, 2010 1:58 PM

## 2010-06-13 NOTE — Progress Notes (Signed)
Summary: prior auth needed for lyrica  Phone Note From Pharmacy   Caller: [facility] Summary of Call: Prior Berkley Harvey is needed for [employer], form is on your desk. Initial call taken by: Lowella Petties CMA,  January 24, 2010 9:13 AM

## 2010-06-13 NOTE — Letter (Signed)
Summary: [facility]   Imported By: Lester Bath 04/19/2010 09:10:53  _____________________________________________________________________  External Attachment:    Type:   Image     Comment:   External Document

## 2010-06-13 NOTE — Medication Information (Signed)
Summary: Coumadin Clinic  Anticoagulant Therapy  Managed by: Cloyde Reams, RN, BSN PCP: Kerby Nora MD Supervising MD: Gala Romney MD, Reuel Boom Indication 1: Deep venous thrombosis INR POC 2.7 INR RANGE 2.5-3.5  Dietary changes: no     Bleeding/hemorrhagic complications: no     Any changes in medication regimen? no     Any missed doses?: no       Is patient compliant with meds? yes      Comments: Pt normally has coumadin check at [facility]. Pt was in office and requested that we check it while she was here.   Allergies: 1)  ! Amoxicillin  Anticoagulation Management History:      The patient is taking warfarin and comes in today for a routine follow up visit.  Positive risk factors for bleeding include an age of 75 years or older.  The bleeding index is 'intermediate risk'.  Positive CHADS2 values include History of CHF, History of HTN, and Age > 73 years old.  Her last INR was 6.2.  Anticoagulation responsible provider: Bensimhon MD, Reuel Boom.  INR POC: 2.7.  Cuvette Lot#: [id number].  Exp: 02/2011.    Anticoagulation Management Assessment/Plan:      The patient's current anticoagulation dose is Warfarin sodium 5 mg  tabs: Per instructions: 1/2 tab alternating per the sheet with 1 whole tab..  The target INR is 2.5-3.5.  The next INR is due giving patient written rx for INR, will be out of town for 7 weeks.  Anticoagulation instructions were given to patient.  Results were reviewed/authorized by Cloyde Reams, RN, BSN.  She was notified by Benedict Needy, RN.         Prior Anticoagulation Instructions: coumadin 5 mg daily  Current Anticoagulation Instructions: INR 2.7  Continue taking 1 tab daily except for 1/2 tab on Monday, Wednesday and Friday.

## 2010-06-13 NOTE — Medication Information (Signed)
Summary: Lyrica Approved/[facility]  Lyrica Approved/[facility]   Imported By: Sherian Rein 01/31/2010 10:57:21  _____________________________________________________________________  External Attachment:    Type:   Image     Comment:   External Document

## 2010-06-13 NOTE — Medication Information (Signed)
Summary: Coumadin Clinic  Anticoagulant Therapy  Managed by: Charlena Cross, RN, BSN PCP: Kerby Nora MD Supervising MD: Gala Romney MD, Reuel Boom Indication 1: Deep venous thrombosis INR POC 3.4 INR RANGE 2.5-3.5  Dietary changes: no    Health status changes: no    Bleeding/hemorrhagic complications: no    Recent/future hospitalizations: no    Any changes in medication regimen? no    Recent/future dental: no  Any missed doses?: no       Is patient compliant with meds? yes       Allergies: 1)  ! Amoxicillin  Anticoagulation Management History:      The patient is taking warfarin and comes in today for a routine follow up visit.  Positive risk factors for bleeding include an age of 75 years or older.  The bleeding index is 'intermediate risk'.  Positive CHADS2 values include History of CHF, History of HTN, and Age > 6 years old.  Her last INR was 2.1.  Anticoagulation responsible provider: Fox Salminen MD, Reuel Boom.  INR POC: 3.4.    Anticoagulation Management Assessment/Plan:      The patient's current anticoagulation dose is Warfarin sodium 5 mg  tabs: Per instructions: 1/2 tab alternating per the sheet with 1 whole tab..  The target INR is 2.5-3.5.  The next INR is due 08/18/2009.  Anticoagulation instructions were given to patient.  Results were reviewed/authorized by Charlena Cross, RN, BSN.  She was notified by Charlena Cross, RN, BSN.         Prior Anticoagulation Instructions: instructed pt to maintain on current dose and see Aurther Loft @ [facility] Monday or Tues for proper dosing as we do not have her records.  Current Anticoagulation Instructions: coumadin 5 mg daily

## 2010-06-13 NOTE — Progress Notes (Signed)
Summary: regarding PT, INR  Phone Note From Other Clinic   Caller: Larita Fife, nurse with [facility] [phone] Summary of Call: Nurse called wth PT of 1.4 and INR of 16.7.  Please let her know when to recheck. Initial call taken by: Lowella Petties CMA, AAMA,  April 18, 2010 12:03 PM  Follow-up for Phone Call        Please ask pt below questions and put answers beside questions.  Current medication dosing:5mg  daily Tablet strength: 5 mg   Coumadin dose missed/changed:  no Abnormal Bleeding Symptoms: no Any diet changes including alcohol intake, vegetables or greens since the last visit:  no Any illnesses or hospitalizations since the last visit: yes  Any signs of clotting since the last visit:no  Previous INR:1.1 on 12/1... coumadin initially held in hospital due to low level DIC.Marland Kitchenresolving at discharge. Restarted on previous dosing.   Follow-up by: Kerby Nora MD,  April 19, 2010 11:37 AM  Additional Follow-up for Phone Call Additional follow up Details #1::        Have pt increase to 2 tabs today then return to 5 mg daily...recheck on Monday.  Additional Follow-up by: Kerby Nora MD,  April 19, 2010 11:45 AM    Additional Follow-up for Phone Call Additional follow up Details #2::    Patient home nurse advised.Consuello Masse CMA   Follow-up by: Benny Lennert CMA Duncan Dull),  April 19, 2010 11:46 AM

## 2010-06-13 NOTE — Assessment & Plan Note (Signed)
Summary: "COURTESY CALL"BEFORE APPT W/ DR ZOXWR   Vital Signs:  Patient profile:   77 year old female Height:      64 inches Weight:      293 pounds BMI:     50.48 Temp:     97.7 degrees F oral Pulse rate:   84 / minute Pulse rhythm:   regular BP sitting:   126 / 72  (left arm) Cuff size:   large  Vitals Entered By: Delilah Shan CMA Duncan Dull) (March 02, 2010 12:00 PM) CC: Hospital follow up [facility].     History of Present Illness:  Angelise is a 77 y/o woman with h/o morbid obesity, atrial fib maintaining SR on flecainide, DVT on the left and PE in 2007, diastolic HF, borderline OSA by sleep study, nonobstructive CAD by cath 8/10 and small AAA.  Recent hospitalization for leg wound , very large 9/13-9/20  Went into rehabd. Had to return to hospital due to anemia from blood loss...3 Units given. Then back to rehab for 21 days...discharged about 2 weeks ago.  S/P surgical debridement 1 week ago.  Has wound vac in place.  Occured after accidental fall. Dr. Egbert Garibaldi is Careers adviser..follows up in few weeks.  Not requireing percocet.   Since surgery..has been on oxygen...dropping with walking. Has appt next week with Pulm. Had 2 CXR.. first one in 01/2010..showed pulmonary edema. Has history of diastolic heart failure, afib, pulmonary embolus on coumadin.  HAS lost 26 lbs in last month..she states edema in feet is dramatically better.   On torsemide 20 mg 2 tab daily..using metalozone as needed. HAs not used in a while..Weight stable per pt in last 3 days.  Saw Dr. Derl Barrow in 10/4..felt heart not cause of current SOB, hypoxia per 10/14 OV.   O2 sats have been increasing gradually over time.  Shingles resolved with lyrica daily.  Allergies well controlled off singulair.   Problems Prior to Update: 1)  Hypoxemia  (ICD-799.02) 2)  Obesity-morbid (>100')  (ICD-278.01) 3)  Diastolic Heart Failure, Chronic  (ICD-428.32) 4)  Abdominal Aneurysm Without Mention of Rupture   (ICD-441.4) 5)  Chest Tightness-pressure-other  ([id number]) 6)  Chest Pain, Precordial  (ICD-786.51) 7)  Diastolic Heart Failure, Chronic  (ICD-428.32) 8)  Postherpetic Neuralgia  (ICD-053.19) 9)  Screening For Lipoid Disorders  (ICD-V77.91) 10)  Allergic Rhinitis  (ICD-477.9) 11)  Depression, Acute  (ICD-296.23) 12)  Cerumen Impaction, Bilateral  (ICD-380.4) 13)  Sob  (ICD-786.05) 14)  Atrial Fibrillation  (ICD-427.31) 15)  Meningitis, Hx of  (ICD-V12.49) 16)  Shingles, Hx of  (ICD-V13.8) 17)  Sleep Apnea  (ICD-780.57) 18)  Hypertension  (ICD-401.9) 19)  Fibrillation, Atrial  (ICD-427.31) 20)  Thrombosis, Venous Nos  (ICD-453.9) 21)  Embolism & Infarction, Iatrogenic Pulmonary  (ICD-415.11) 22)  Aftercare, Long-term Use, Anticoagulants  (ICD-V58.61) 23)  Encounter For Therapeutic Drug Monitoring  (ICD-V58.83)  Current Medications (verified): 1)  Cardizem Cd 120 Mg Xr24h-Cap (Diltiazem Hcl Coated Beads) .[name].. 1 Tab By Mouth Daily 2)  Warfarin Sodium 5 Mg  Tabs (Warfarin Sodium) .... Per Instructions: 1/2 Tab Alternating Per The Sheet With 1 Whole Tab. 3)  Metolazone 2.5 Mg Tabs (Metolazone) .[name].. 1 By Mouth Daily If Needed Only To Take If Furosemide Is Not Working. 4)  Lipitor 40 Mg Tabs (Atorvastatin Calcium) .[name].. 1 By Mouth Daily 5)  Zyrtec Allergy 10 Mg Tabs (Cetirizine Hcl) .[name].. 1 By Mouth Daily As Needed 6)  Torsemide 20 Mg Tabs (Torsemide) .... Take Two Tablets Daily 7)  Effexor Xr 75 Mg Xr24h-Cap (Venlafaxine Hcl) .[name].. 1 Tab By Mouth Daily 8)  Flecainide Acetate 100 Mg Tabs (Flecainide Acetate) .... Take One Tablet By Mouth Every 12 Hours 9)  Aspirin 81 Mg Tbec (Aspirin) .... Take One Tablet By Mouth Daily 10)  Klor-Con M20 20 Meq Cr-Tabs (Potassium Chloride Crys Cr) .[name].. 1 Tab By Mouth Daily 11)  Lidoderm 5 % Ptch (Lidocaine) .... Apply Daily, 12 Hours On and 12 Hours Off. 12)  Lyrica 75 Mg Caps (Pregabalin) .[name].. 1 Tab By Mouth Two Times Daily  3-4 Days Then Increase To 2 Tabs  By Mouth Two Times A Day 13)  Oxygen  2 Liters .... Daily 14)  Hydrocodone-Acetaminophen 5-325 Mg Tabs (Hydrocodone-Acetaminophen) .[name].. 1-2 Tablet Every Four Hours As Needed 15)  Venlafaxine Hcl 75 Mg Tabs (Venlafaxine Hcl) .... One Tablet As Needed 16)  Advair Diskus 250-50 Mcg/dose Aepb (Fluticasone-Salmeterol) .... Inhale One Puff Two Times A Day  Allergies: 1)  ! Amoxicillin  Review of Systems General:  Denies fatigue and fever. CV:  Complains of difficulty breathing at night and difficulty breathing while lying down; denies chest pain or discomfort. Resp:  Complains of shortness of breath; denies cough, sputum productive, and wheezing. GI:  Denies abdominal pain. GU:  Denies dysuria.  Physical Exam  General:  NAD, morbidly obese Ears:  External ear exam shows no significant lesions or deformities.  Otoscopic examination reveals clear canals, tympanic membranes are intact bilaterally without bulging, retraction, inflammation or discharge. Hearing is grossly normal bilaterally. Nose:  External nasal examination shows no deformity or inflammation. Nasal mucosa are pink and moist without lesions or exudates. Mouth:  Oral mucosa and oropharynx without lesions or exudates.  Teeth in good repair. Neck:  no carotid bruit or thyromegaly no cervical or supraclavicular lymphadenopathy  Lungs:  Normal respiratory effort, chest expands symmetrically.Rales in B bases 1/4 way up..not noted on cards exam 10/14 Heart:  Normal rate and regular rhythm. S1 and S2 normal without gallop, murmur, click, rub or other extra sounds. Abdomen:  Bowel sounds positive,abdomen soft and non-tender without masses, organomegaly or hernias noted. Pulses:  B 1 plus edema and chronic venous changes Extremities:  1+ left pedal edema and 1+ right pedal edema.   Skin:  chronic venous stasis changes large wound left leg..with wound vac in place.   Impression & Recommendations:  Problem # 1:  HYPOXEMIA  (ICD-799.02) Since hospitalization unable to get off O2....but complaining of some SOB prior.  She has had several hospitalizations for pulmonary edema in past year..including noted on CXR at 01/2010 hospitalization for leg injury/ YToday..there is some evidence of ? fluid in lungs mild...may be contributiing to SOB and hypoxia.Marland Kitchenwill have her take 2 extra doses on metalozone this weekend. This may be contributing to oxygen dependance.  Have home health follow up with lung exam on Monday. Continue PT as O2 status is imptroving some per pt with rehabilitation.   She already has appt with pulm for eval...reasonable given past PE and chronic SOB.   Problem # 2:  SOB (ICD-786.05)  Problem # 3:  POSTHERPETIC NEURALGIA (ICD-053.19) Resolved/well controlled  on lyrica!  Problem # 4:  DEPRESSION, ACUTE (ICD-296.23) Well controlled.   Problem # 5:  WOUND, OPEN, LEG, WITH COMPLICATION (ICD-891.1) Doing well per Dr. Egbert Garibaldi. On wound vac.   Complete Medication List: 1)  Cardizem Cd 120 Mg Xr24h-cap (Diltiazem hcl coated beads) .[name].. 1 tab by mouth daily 2)  Warfarin Sodium 5 Mg Tabs (Warfarin sodium) .Marland KitchenMarland KitchenMarland Kitchen  Per instructions: 1/2 tab alternating per the sheet with 1 whole tab. 3)  Metolazone 2.5 Mg Tabs (Metolazone) .[name].. 1 by mouth daily if needed only to take if furosemide is not working. 4)  Lipitor 40 Mg Tabs (Atorvastatin calcium) .[name].. 1 by mouth daily 5)  Zyrtec Allergy 10 Mg Tabs (Cetirizine hcl) .[name].. 1 by mouth daily as needed 6)  Torsemide 20 Mg Tabs (Torsemide) .... Take two tablets daily 7)  Effexor Xr 75 Mg Xr24h-cap (Venlafaxine hcl) .[name].. 1 tab by mouth daily 8)  Flecainide Acetate 100 Mg Tabs (Flecainide acetate) .... Take one tablet by mouth every 12 hours 9)  Aspirin 81 Mg Tbec (Aspirin) .... Take one tablet by mouth daily 10)  Klor-con M20 20 Meq Cr-tabs (Potassium chloride crys cr) .[name].. 1 tab by mouth daily 11)  Lidoderm 5 % Ptch (Lidocaine) .... Apply daily, 12 hours on and 12 hours  off. 12)  Lyrica 75 Mg Caps (Pregabalin) .[name].. 1 tab by mouth two times daily  3-4 days then increase to 2 tabs by mouth two times a day 13)  Oxygen 2 Liters  .... Daily 14)  Hydrocodone-acetaminophen 5-325 Mg Tabs (Hydrocodone-acetaminophen) .[name].. 1-2 tablet every four hours as needed 15)  Venlafaxine Hcl 75 Mg Tabs (Venlafaxine hcl) .... One tablet as needed 16)  Advair Diskus 250-50 Mcg/dose Aepb (Fluticasone-salmeterol) .... Inhale one puff two times a day  Patient Instructions: 1)  Have nurse follow breathing to see if crackles ilungs improving...have her call for report on mOnday. 2)  Take 2 extra doses of metalozone. 3)  Follow weights carefully. 4)  Keep appt next week with Pulmonology.    Orders Added: 1)  Est. Patient Level IV [04540]    Current Allergies (reviewed today): ! AMOXICILLIN

## 2010-06-13 NOTE — Progress Notes (Signed)
  Phone Note Call from Patient   Caller: Patient Summary of Call: Called patient to give her the Pulm appt and to set her up a FU with you before the Pul appt which isn 03/06/2010 with Dr Delton Coombes. She said she is having more leg surgery next Friday and she will call the office to set up a 30 minute Hosp FU with you after she has the second surgery done. Initial call taken by: Carlton Adam,  February 16, 2010 4:20 PM

## 2010-06-13 NOTE — Consult Note (Signed)
[facility]   Imported By: Harlon Flor 01/29/2010 12:17:17  _____________________________________________________________________  External Attachment:    Type:   Image     Comment:   External Document

## 2010-06-13 NOTE — Letter (Signed)
Summary: Generic Letter  [place] [facility]  [address] [place], [place] [address]   Phone: [phone]  Fax: [phone]    11/29/2009  [facility] [address] [place], [place]  [address]   Dear Ms. Welle,    Please draw Protime on patient today 11/29/09 (dx code 427.31 Atrial Fib) Fax results to [phone].     Thank You,    Kerby Nora MD    Liane Comber CMA (AAMA)

## 2010-06-13 NOTE — Assessment & Plan Note (Signed)
Summary: CHECK KIDNEYS,SHINGLES/CLE   Vital Signs:  Patient profile:   77 year old female Weight:      310 pounds Temp:     97.8 degrees F oral Pulse rate:   84 / minute Pulse rhythm:   regular BP sitting:   130 / 78  (right arm) Cuff size:   large  Vitals Entered By: Lowella Petties CMA (January 19, 2010 2:30 PM) CC: Follow up from Dr. Lewie Loron- check kidney functions, pt has chronic pain from shingles, Hypertension Management   History of Present Illness: Here for follow up for post herpetic neuralgia. She is over due for labs and CPX.   In last month...she has been having increase in peripheral edema and SOB...saw Dr. Mariah Milling  8/26 for diastolic dysfunction...had had 40 lb weight gain and poor diet ...was on torsemide. He suggested that she take metolazone with torsemide b.i.d. for the next 2 days and then returned to torsemide b.i.d.  He has requested that we reeval creatinine today and K.  Wearing compression hose.  She has had significant imrpovement in breathing and swelling since then.  Since that OV weight has dropped 9 lbs.  HTN, improved control from [place].   Post herpetic neuralgia...on lidocaine..pain is very bothersome.Only relief is sitting. Wearing brace which helps some.  Has tried neurontin., cymbaltas, capsacin. This limits her moving and makes her mood poor.  Depression, inadequate control on 75 mg effexor.  Decrease motivation.Marland Kitchenandhedonia.   Hypertension History:      Positive major cardiovascular risk factors include female age 82 years old or older and hypertension.  Negative major cardiovascular risk factors include non-tobacco-user status.        Positive history for target organ damage include cardiac end organ damage (either CHF or LVH) and peripheral vascular disease.     Problems Prior to Update: 1)  Obesity-morbid (>100')  (ICD-278.01) 2)  Diastolic Heart Failure, Chronic  (ICD-428.32) 3)  Abdominal Aneurysm Without Mention of Rupture   (ICD-441.4) 4)  Chest Tightness-pressure-other  ([id number]) 5)  Chest Pain, Precordial  (ICD-786.51) 6)  Diastolic Heart Failure, Chronic  (ICD-428.32) 7)  Postherpetic Neuralgia  (ICD-053.19) 8)  Screening For Lipoid Disorders  (ICD-V77.91) 9)  Allergic Rhinitis  (ICD-477.9) 10)  Depression, Acute  (ICD-296.23) 11)  Cerumen Impaction, Bilateral  (ICD-380.4) 12)  Sob  (ICD-786.05) 13)  Atrial Fibrillation  (ICD-427.31) 14)  Meningitis, Hx of  (ICD-V12.49) 15)  Shingles, Hx of  (ICD-V13.8) 16)  Sleep Apnea  (ICD-780.57) 17)  Hypertension  (ICD-401.9) 18)  Fibrillation, Atrial  (ICD-427.31) 19)  Thrombosis, Venous Nos  (ICD-453.9) 20)  Embolism & Infarction, Iatrogenic Pulmonary  (ICD-415.11) 21)  Aftercare, Long-term Use, Anticoagulants  (ICD-V58.61) 22)  Encounter For Therapeutic Drug Monitoring  (ICD-V58.83)  Current Medications (verified): 1)  Cardizem Cd 120 Mg Xr24h-Cap (Diltiazem Hcl Coated Beads) .[name].. 1 Tab By Mouth Daily 2)  Warfarin Sodium 5 Mg  Tabs (Warfarin Sodium) .... Per Instructions: 1/2 Tab Alternating Per The Sheet With 1 Whole Tab. 3)  Metolazone 2.5 Mg Tabs (Metolazone) .[name].. 1 By Mouth Daily If Needed Only To Take If Furosemide Is Not Working. 4)  Lipitor 40 Mg Tabs (Atorvastatin Calcium) .[name].. 1 By Mouth Daily 5)  Zyrtec Allergy 10 Mg Tabs (Cetirizine Hcl) .[name].. 1 By Mouth Daily As Needed 6)  Torsemide 20 Mg Tabs (Torsemide) .... Take 1 Tablet By Mouth Two Times A Day 7)  Effexor Xr 75 Mg Xr24h-Cap (Venlafaxine Hcl) .[name].. 1 Tab By Mouth Daily 8)  Flecainide  Acetate 100 Mg Tabs (Flecainide Acetate) .... Take One Tablet By Mouth Every 12 Hours 9)  Aspirin 81 Mg Tbec (Aspirin) .... Take One Tablet By Mouth Daily 10)  Klor-Con M20 20 Meq Cr-Tabs (Potassium Chloride Crys Cr) .[name].. 1 Tab By Mouth Daily  Allergies (verified): 1)  ! Amoxicillin  Past History:  Past medical, surgical, family and social histories (including risk factors) reviewed, and no changes noted  (except as noted below).  Past Medical History: Reviewed history from 03/02/2009 and no changes required.  1. Chronic diastolic congestive heart failure with mild cor pulmonale          a. ECHO 6/08:  EF 55-60%, mild LVH,  mildly dilated RV w. mildly dec fx, RVSP 67  2. Chronic atrial fibrillation.  Failed cardioversion in past       --repeat DC-CV on october 5th, 2010 on flecainide  3. History of PE/DVT 2007  4. Obstructive sleep apnea.  The patient is not using CPAP.   5. Shingles with postherpetic neuralgia.   6. Hypertension.   7. Morbid obesity.   8. GERD  9. h/o shingles c/b postherpetic neuralgia 10.Hyperlipidemia 11.h/o meningitis 12.Gallstones s/p cholecystectomy 13. Nonobstructive CAD by cath 8/10     --LAD 40-50%. with mild PAH  mean 29 with PVR 3.2 [place] 14. Small AAA     Past Surgical History: Reviewed history from 03/03/2007 and no changes required. Appendectomy- 1947 Tonsillectomy- 1946 Hospitalized with meningitis at [facility]- 1960 Cardioversion- 01/2007  Family History: Reviewed history from 11/25/2006 and no changes required. Mother died age 25.5 of CHF and depression Father died at age 4 of a cerebral hemorrhage, also had a heart defect from birth. Paternal grandmother died of ovarian cancer. Family history of heart disease and obesity.  Social History: Reviewed history from 11/25/2006 and no changes required. 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 CV:  Denies chest pain or discomfort and palpitations. Resp:  Complains of cough; denies excessive snoring; some productive cough...x several months  No fever  some post nassal drip.[name] GI:  Denies abdominal pain. GU:  Denies dysuria.  Physical Exam  General:  NAD, morbidly obese Head:  no maxillary sinus ttp Eyes:  No corneal or  conjunctival inflammation noted. EOMI. Perrla. Funduscopic exam benign, without hemorrhages, exudates or papilledema. Vision grossly normal. Ears:  External ear exam shows no significant lesions or deformities.  Otoscopic examination reveals clear canals, tympanic membranes are intact bilaterally without bulging, retraction, inflammation or discharge. Hearing is grossly normal bilaterally. Nose:  nasal dischargemucosal pallor.   Mouth:  Oral mucosa and oropharynx without lesions or exudates.  Teeth in good repair. Neck:  no carotid bruit or thyromegaly no cervical or supraclavicular lymphadenopathy  Lungs:  Normal respiratory effort, chest expands symmetrically. Lungs are clear to auscultation, no crackles or wheezes. Heart:  Normal rate and regular rhythm. S1 and S2 normal without gallop, murmur, click, rub or other extra sounds. Pulses:  B 1 plus edema and chronic venous changes Skin:  chronic venous stasis changes Psych:  Oriented X3, good eye contact, and slightly anxious.     Impression & Recommendations:  Problem # 1:  DIASTOLIC HEART FAILURE, CHRONIC (ICD-428.32) Significanlty improvemed fluid overload s/p metalazone. Continue on toresemide two times a day...will check K and creatinine today.  Her updated medication list for this problem includes:  Warfarin Sodium 5 Mg Tabs (Warfarin sodium) .[name]... Per instructions: 1/2 tab alternating per the sheet with 1 whole tab.    Metolazone 2.5 Mg Tabs (Metolazone) .[name]... 1 by mouth daily if needed only to take if furosemide is not working.    Torsemide 20 Mg Tabs (Torsemide) .[name]... Take 1 tablet by mouth two times a day    Aspirin 81 Mg Tbec (Aspirin) .[name]... Take one tablet by mouth daily  Orders: TLB-BMP (Basic Metabolic Panel-BMET) (80048-METABOL)  Problem # 2:  POSTHERPETIC NEURALGIA (ICD-053.19)  Has tried Cymbalta, neuronitin, capsacin..SE or minimal improvement.  Feels like mood would be better if pain improved. Start lyrica  ...titrate up over next few weeks.  Follow up in next 1 month.  Hesitant about narcotic pain meds.   Orders: Prescription Created Electronically [phone])  Problem # 3:  DEPRESSION, ACUTE (ICD-296.23) Poor control. Continue effexor...may bneed to change back to wellbutrin or increase if not improving with PHN pain.   Problem # 4:  HYPERTENSION (ICD-401.9)  Improved control on current meds.  Her updated medication list for this problem includes:    Cardizem Cd 120 Mg Xr24h-cap (Diltiazem hcl coated beads) .[name]... 1 tab by mouth daily    Metolazone 2.5 Mg Tabs (Metolazone) .[name]... 1 by mouth daily if needed only to take if furosemide is not working.    Torsemide 20 Mg Tabs (Torsemide) .[name]... Take 1 tablet by mouth two times a day  BP today: 130/78 Prior BP: 180/91 (01/05/2010)  Prior 10 Yr Risk Heart Disease: 7 % (04/14/2008)  Labs Reviewed: K+: 4.6 (04/21/2009) Creat: : 1.1 (04/21/2009)   Chol: 137 (03/02/2008)   HDL: 52.1 (03/02/2008)   LDL: 66 (03/02/2008)   TG: 95 (03/02/2008)  Problem # 5:  ALLERGIC RHINITIS (ICD-477.9) Likely cause of cough and post nasal drip. Restart Zyrtec at bedtime.  Her updated medication list for this problem includes:    Zyrtec Allergy 10 Mg Tabs (Cetirizine hcl) .[name]... 1 by mouth daily as needed  Complete Medication List: 1)  Cardizem Cd 120 Mg Xr24h-cap (Diltiazem hcl coated beads) .[name].. 1 tab by mouth daily 2)  Warfarin Sodium 5 Mg Tabs (Warfarin sodium) .... Per instructions: 1/2 tab alternating per the sheet with 1 whole tab. 3)  Metolazone 2.5 Mg Tabs (Metolazone) .[name].. 1 by mouth daily if needed only to take if furosemide is not working. 4)  Lipitor 40 Mg Tabs (Atorvastatin calcium) .[name].. 1 by mouth daily 5)  Zyrtec Allergy 10 Mg Tabs (Cetirizine hcl) .[name].. 1 by mouth daily as needed 6)  Torsemide 20 Mg Tabs (Torsemide) .... Take 1 tablet by mouth two times a day 7)  Effexor Xr 75 Mg Xr24h-cap (Venlafaxine hcl) .[name].. 1 tab by mouth daily 8)  Flecainide  Acetate 100 Mg Tabs (Flecainide acetate) .... Take one tablet by mouth every 12 hours 9)  Aspirin 81 Mg Tbec (Aspirin) .... Take one tablet by mouth daily 10)  Klor-con M20 20 Meq Cr-tabs (Potassium chloride crys cr) .[name].. 1 tab by mouth daily 11)  Lidoderm 5 % Ptch (Lidocaine) .... Apply daily, 12 hours on and 12 hours off. 12)  Lyrica 75 Mg Caps (Pregabalin) .[name].. 1 tab by mouth two times daily  3-4 days then increase to 2 tabs by mouth two times a day  Other Orders: Flu Vaccine 29yrs + MEDICARE PATIENTS (U0454) Administration Flu vaccine - MCR (U9811)  Hypertension Assessment/Plan:      The patient's hypertensive risk group is category C: Target organ damage and/or diabetes.  Her calculated 10 year risk of coronary heart disease is 7 %.  Today's blood pressure is 130/78.  Her blood pressure goal is < 140/90.  Patient Instructions: 1)  Schedule CPX in next 1-2 months wtih fasting labs prior LIPIDIS. CMET Dx 272.0, 401.1, TSH, cbc, B12 780.79 2)  Start back zyrtec at bedtime. 3)  Start lyrica two times a day, taper up as tolerated. 4)   Continue all other medicines including effexor. Prescriptions: LYRICA 75 MG CAPS (PREGABALIN) 1 tab by mouth two times daily  3-4 days then increase to 2 tabs by mouth two times a day  #60 x 3   Entered and Authorized by:   Kerby Nora MD   Signed by:   Kerby Nora MD on 01/19/2010   Method used:   Print then Give to Patient   RxID:   [id number] LIDODERM 5 % PTCH (LIDOCAINE) Apply daily, 12 hours on and 12 hours off.  #1 box x 11   Entered and Authorized by:   Kerby Nora MD   Signed by:   Kerby Nora MD on 01/19/2010   Method used:   Faxed to ...       [facility] (mail-order)       [address] [place], [place]  [address]       Ph: [phone]       Fax: [phone]   RxID:   [id number]   Prior Medications (reviewed today): CARDIZEM CD 120 MG XR24H-CAP (DILTIAZEM HCL COATED BEADS) 1 tab by mouth  daily WARFARIN SODIUM 5 MG  TABS (WARFARIN SODIUM) Per instructions: 1/2 tab alternating per the sheet with 1 whole tab. METOLAZONE 2.5 MG TABS (METOLAZONE) 1 by mouth daily if needed only to take if furosemide is not working. LIPITOR 40 MG TABS (ATORVASTATIN CALCIUM) 1 by mouth daily ZYRTEC ALLERGY 10 MG TABS (CETIRIZINE HCL) 1 by mouth daily as needed TORSEMIDE 20 MG TABS (TORSEMIDE) Take 1 tablet by mouth two times a day EFFEXOR XR 75 MG XR24H-CAP (VENLAFAXINE HCL) 1 tab by mouth daily FLECAINIDE ACETATE 100 MG TABS (FLECAINIDE ACETATE) Take one tablet by mouth every 12 hours ASPIRIN 81 MG TBEC (ASPIRIN) Take one tablet by mouth daily KLOR-CON M20 20 MEQ CR-TABS (POTASSIUM CHLORIDE CRYS CR) 1 tab by mouth daily Current Allergies (reviewed today): ! AMOXICILLIN   Flu Vaccine Consent Questions     Do you have a history of severe allergic reactions to this vaccine? no    Any prior history of allergic reactions to egg and/or gelatin? no    Do you have a sensitivity to the preservative Thimersol? no    Do you have a past history of Guillan-Barre Syndrome? no    Do you currently have an acute febrile illness? no    Have you ever had a severe reaction to latex? no    Vaccine information given and explained to patient? yes    Are you currently pregnant? no    Lot Number:[id number]   Exp Date:11/10/2010   Site Given  Left Deltoid IM ASPIRIN 81 MG TBEC (ASPIRIN) Take one tablet by mouth daily KLOR-CON M20 20 MEQ CR-TABS (POTASSIUM CHLORIDE CRYS CR) 1 tab by mouth daily Current Allergies (reviewed today): ! AMOXICILLIN   .lbmedflu  Appended Document: CHECK KIDNEYS,SHINGLES/CLE

## 2010-06-13 NOTE — Progress Notes (Signed)
  Phone Note Call from Patient Call back at [phone]   Caller: Patient Call For: Kerby Nora MD Summary of Call: Patient calling will be leaving rehab on monday and wants a referral to pulmonology b/c put on oxygen in hospital after leg surgery and even on oxygen patient stats are low. Patient is currently on 2 liters of oxygen. Patient next appt with you not until december.Consuello Masse CMA   Please refer to pulmonlogy and call patient on cell with appt.   Initial call taken by: Benny Lennert CMA Duncan Dull),  February 16, 2010 2:24 PM  Follow-up for Phone Call        Referral sent. Follow-up by: Kerby Nora MD,  February 16, 2010 2:34 PM  Additional Follow-up for Phone Call Additional follow up Details #1::        patietn advised.Consuello Masse CMA   Additional Follow-up by: Benny Lennert CMA Duncan Dull),  February 16, 2010 2:36 PM

## 2010-06-13 NOTE — Letter (Signed)
Summary: [facility] - Discharge Summary  [facility] - Discharge Summary   Imported By: Marylou Mccoy 02/14/2010 12:03:27  _____________________________________________________________________  External Attachment:    Type:   Image     Comment:   External Document

## 2010-06-13 NOTE — Letter (Signed)
Summary: [facility]   Imported By: Sherian Rein 02/22/2010 08:54:09  _____________________________________________________________________  External Attachment:    Type:   Image     Comment:   External Document

## 2010-06-13 NOTE — Miscellaneous (Signed)
Summary: Orders/[facility]  Orders/[facility]   Imported By: Lester  04/19/2010 13:38:11  _____________________________________________________________________  External Attachment:    Type:   Image     Comment:   External Document

## 2010-06-13 NOTE — Progress Notes (Signed)
Summary: PT/INR results  Phone Note From Other Clinic Call back at [phone]   Caller: Lynn/[facility] Call For: Dr. Ermalene Searing Summary of Call: Calling with lab results; PT 54.4, INR 4.5 Initial call taken by: Sydell Axon LPN,  March 26, 2010 3:16 PM  Follow-up for Phone Call        Hold x 1 day (hold next dose)  Then restart Coumadin 3 mg, 1 by mouth daily.   (decrease from 22.5 mg total weekly dose to 21 mg a week total dose)  recheck 1 week Hannah Beat MD  March 26, 2010 3:42 PM   Additional Follow-up for Phone Call Additional follow up Details #1::        Notified patient with instructions, she needs 1 mg Warfarin called in to [facility], [phone]. She will have the home health nurse check her next INR Monday, Nov 21,2011. Additional Follow-up by: Mills Koller,  March 26, 2010 3:49 PM    Additional Follow-up for Phone Call Additional follow up Details #2::    rx snet to pharmacy.Consuello Masse CMA   Follow-up by: Benny Lennert CMA Duncan Dull),  March 26, 2010 4:09 PM  New/Updated Medications: WARFARIN SODIUM 1 MG TABS (WARFARIN SODIUM) take as directed Prescriptions: WARFARIN SODIUM 1 MG TABS (WARFARIN SODIUM) take as directed  #30 x 3   Entered by:   Benny Lennert CMA (AAMA)   Authorized by:   Hannah Beat MD   Signed by:   Benny Lennert CMA (AAMA) on 03/26/2010   Method used:   Faxed to ...       [facility] (mail-order)       [address] [place], [place]  [address]       Ph: [phone]       Fax: [phone]   RxID:   [id number]

## 2010-06-13 NOTE — Progress Notes (Signed)
Summary: Fyi  Phone Note Call from Patient   Caller: daughter in law Call For: Kerby Nora MD Summary of Call: Pt  has gone to hosp for chest pain, likely to be admitted. Initial call taken by: Liane Comber CMA (AAMA),  June 28, 2009 1:11 PM

## 2010-06-13 NOTE — Miscellaneous (Signed)
Summary: Order PT/INR/[facility]  Order PT/INR/[facility]   Imported By: Lester Quincy 04/04/2010 13:57:19  _____________________________________________________________________  External Attachment:    Type:   Image     Comment:   External Document

## 2010-06-13 NOTE — Letter (Signed)
Summary: [facility]   Imported By: Maryln Gottron 02/09/2010 14:24:13  _____________________________________________________________________  External Attachment:    Type:   Image     Comment:   External Document

## 2010-06-13 NOTE — Progress Notes (Signed)
Summary: regarding meds  Phone Note From Other Clinic Call back at [phone]   Caller: Liborio Nixon with [facility]   Summary of Call: Home health nurse is asking if pt is to continue cardizem, it is not on discharge summary from [facility].  Also, she is supposed to be taking singulair,per d/c summary, 10 mg daily, but they didnt give her a script.  Uses [facility]. Initial call taken by: Lowella Petties CMA,  February 20, 2010 2:16 PM  Follow-up for Phone Call        Please get discharge summary from most recent hospitalization.  Does singulair help with allergies?  If so continue and refill..if not stop. Follow-up by: Kerby Nora MD,  February 20, 2010 2:25 PM  Additional Follow-up for Phone Call Additional follow up Details #1::        I did not see any reason in hospital discharge summary that she needs to stop cardiazem...continue...she can also call cardiologist office to verify.  Additional Follow-up by: Kerby Nora MD,  February 20, 2010 3:31 PM    Prescriptions: SINGULAIR 10 MG TABS (MONTELUKAST SODIUM) one tablet once daily  #30 x 3   Entered by:   Benny Lennert CMA (AAMA)   Authorized by:   Kerby Nora MD   Signed by:   Benny Lennert CMA (AAMA) on 02/21/2010   Method used:   Faxed to ...       [facility] (mail-order)       [address] [place], [place]  [address]       Ph: [phone]       Fax: [phone]   RxID:   [id number] CARDIZEM CD 120 MG XR24H-CAP (DILTIAZEM HCL COATED BEADS) 1 tab by mouth daily  #30 x 6   Entered by:   Benny Lennert CMA (AAMA)   Authorized by:   Kerby Nora MD   Signed by:   Benny Lennert CMA (AAMA) on 02/21/2010   Method used:   Faxed to ...       [facility] (mail-order)       [address] [place], [place]  [address]       Ph: [phone]       Fax: [phone]   RxID:   [id number]

## 2010-06-13 NOTE — Medication Information (Signed)
Summary: Prior Authorization Request for Lyrica  Prior Authorization Request for Lyrica   Imported By: Maryln Gottron 02/09/2010 11:12:05  _____________________________________________________________________  External Attachment:    Type:   Image     Comment:   External Document

## 2010-06-13 NOTE — Miscellaneous (Signed)
Summary: PT INR Draw Orders/[facility]  PT INR Draw Orders/[facility]   Imported By: Lanelle Bal 03/23/2010 15:13:42  _____________________________________________________________________  External Attachment:    Type:   Image     Comment:   External Document

## 2010-06-13 NOTE — Op Note (Signed)
Summary: [facility]-Debridement of wound  [facility]De[facility]ement [facility]   Imported By: Maryln Gottron 03/02/2010 09:01:33  _____________________________________________________________________  External Attachment:    Type:   Image     Comment:   External Document

## 2010-06-13 NOTE — Letter (Signed)
Summary: [facility]   Imported By: Lanelle Bal 02/07/2010 12:27:58  _____________________________________________________________________  External Attachment:    Type:   Image     Comment:   External Document

## 2010-06-13 NOTE — Assessment & Plan Note (Signed)
Summary: Edema/SOB   Visit Type:  Follow-up Primary [name] [name]:  Kerby Nora MD  CC:  c/o swelling in legs and feet and ankles and shortness of breath. [place] the beach since [place] and just got back home yesterday.[name] [name]  History of Present Illness: 77 y/o woman with h/o morbid obesity, atrial fib maintaining SR on flecainide, DVT on the left and PE in 2007, diastolic HF, borderline OSA by sleep study, nonobstructive CAD by cath 8/10 and small AAA, she sense for evaluation after significant weight gain, increasing shortness of breath, edema.   she reports that she [place] the beach since [place]. She has not been taking care of herself. Her weight is up 40 pounds, she eats out at restaurants frequently, but watch her salt intake, has not been exercising. She has more shortness of breath with exertion, more edema. His son is very concerned. She reports that she has been taking her medications as directed. She denies any chest pain.  Admittted for overnight observation due to CP and mild diastolic HF in 06/2008. Ruled out for MI and discharged after mild diuresis.  EKG shows normal sinus rhythm with rate of 82 beats per minute, sinus arrhythmia, poor R-wave progression through the precordial leads, left axis deviation.   Current Medications (verified): 1)  Cardizem Cd 120 Mg Xr24h-Cap (Diltiazem Hcl Coated Beads) .[name] [name].. 1 Tab By Mouth Daily 2)  Warfarin Sodium 5 Mg  Tabs (Warfarin Sodium) .... Per Instructions: 1/2 Tab Alternating Per The Sheet With 1 Whole Tab. 3)  Metolazone 2.5 Mg Tabs (Metolazone) .[name] [name].. 1 By Mouth Daily If Needed Only To Take If Furosemide Is Not Working. 4)  Lipitor 40 Mg Tabs (Atorvastatin Calcium) .[name] [name].. 1 By Mouth Daily 5)  Zyrtec Allergy 10 Mg Tabs (Cetirizine Hcl) .[name] [name].. 1 By Mouth Daily As Needed 6)  Torsemide 20 Mg Tabs (Torsemide) .... Take 1 Tablet By Mouth Two Times A Day 7)  Effexor Xr 75 Mg Xr24h-Cap (Venlafaxine Hcl) .[name] [name].. 1 Tab By Mouth Daily 8)  Flecainide  Acetate 100 Mg Tabs (Flecainide Acetate) .... Take One Tablet By Mouth Every 12 Hours 9)  Aspirin 81 Mg Tbec (Aspirin) .... Take One Tablet By Mouth Daily 10)  Klor-Con M20 20 Meq Cr-Tabs (Potassium Chloride Crys Cr) .[name] [name].. 1 Tab By Mouth Daily 11)  Effexor Xr 75 Mg Xr24h-Cap (Venlafaxine Hcl) .... One Tablet Once Daily  Allergies (verified): 1)  ! Amoxicillin  Past History:  Past Medical History: Last updated: 03/02/2009  1. Chronic diastolic congestive heart failure with mild cor pulmonale          a. ECHO 6/08:  EF 55-60%, mild LVH,  mildly dilated RV w. mildly dec fx, RVSP 67  2. Chronic atrial fibrillation.  Failed cardioversion in past       --repeat DC-CV on october 5th, 2010 on flecainide  3. History of PE/DVT 2007  4. Obstructive sleep apnea.  The patient is not using CPAP.   5. Shingles with postherpetic neuralgia.   6. Hypertension.   7. Morbid obesity.   8. GERD  9. h/o shingles c/b postherpetic neuralgia 10.Hyperlipidemia 11.h/o meningitis 12.Gallstones s/p cholecystectomy 13. Nonobstructive CAD by cath 8/10     --LAD 40-50%. with mild PAH  mean 29 with PVR 3.2 Woods 14. Small AAA     Past Surgical History: Last updated: 03/03/2007 Appendectomy- 1947 Tonsillectomy- 1946 Hospitalized with meningitis at [facility]- 1960 Cardioversion- 01/2007  Family History: Last updated: 12/04/2006 Mother died age 78.5 of  CHF and depression Father died at age 56 of a cerebral hemorrhage, also had a heart defect from birth. Paternal grandmother died of ovarian cancer. Family history of heart disease and obesity.  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  The  patient denies fever, weight loss, vision loss, decreased hearing, hoarseness, chest pain, syncope, prolonged cough, abdominal pain, incontinence, muscle weakness, depression, and enlarged lymph nodes.    Vital Signs:  Patient profile:   77 year old female Height:      64 inches Weight:      319 pounds BMI:     54.95 Pulse rate:   86 / minute Pulse (ortho):   92 / minute BP sitting:   180 / 91  (left arm) Cuff size:   large  Vitals Entered By: Bishop Dublin, CMA (January 05, 2010 11:29 AM)  Physical Exam  General:  Well developed, well nourished, in no acute distress. Morbidly obese Head:  normocephalic and atraumatic Neck:  Neck supple, no JVD. No masses, thyromegaly or abnormal cervical nodes. Lungs:  Clear bilaterally to auscultation and percussion. Heart:  Non-displaced PMI, chest non-tender; regular rate and rhythm, S1, S2 without murmurs, rubs or gallops. Carotid upstroke normal, no bruit.  Pedals normal pulses. 1+ edema, no varicosities. Abdomen:   abdomen soft and non-tender without masses, obese Msk:  Back normal, normal gait. Muscle strength and tone normal. Pulses:  pulses normal in all 4 extremities Extremities:  No clubbing or cyanosis. Neurologic:  Alert and oriented x 3. Skin:  Intact without lesions or rashes. Psych:  Normal affect.   Impression & Recommendations:  Problem # 1:  DIASTOLIC HEART FAILURE, CHRONIC (ICD-428.32) I suspect that she does have some component of diastolic heart failure on today's visit. It is difficult to determine if her edema is from fluid overload or venous insufficiency.  shortness of breath 40 pound weight gain over the past year or mild fluid overload. We have suggested that she take metolazone with torsemide b.i.d. for the next 2 days and then returned to torsemide b.i.d. Her diet should improve now that she is back from the beach.  If she has worsening shortness of breath, no significant improvement, I have asked her to contact  me. She is due to followup with Dr. Ermalene Searing in the next week or 2 for her herpetic neuralgia. Perhaps at that time, we could perform a basic metabolic panel. If her creatinine is climbing, we would have to cut back on her torsemide.  Her updated medication list for this problem includes:    Cardizem Cd 120 Mg Xr24h-cap (Diltiazem hcl coated beads) .[name] [name]... 1 tab by mouth daily    Warfarin Sodium 5 Mg Tabs (Warfarin sodium) .[name] [name]... Per instructions: 1/2 tab alternating per the sheet with 1 whole tab.    Metolazone 2.5 Mg Tabs (Metolazone) .[name] [name]... 1 by mouth daily if needed only to take if furosemide is not working.    Torsemide 20 Mg Tabs (Torsemide) .[name] [name]... Take 1 tablet by mouth two times a day    Flecainide Acetate 100 Mg Tabs (Flecainide acetate) .[name] [name]... Take one tablet by mouth every 12 hours    Aspirin 81 Mg Tbec (Aspirin) .[name] [name]... Take one tablet by mouth daily  Problem #  2:  OBESITY-MORBID (>100') (ICD-278.01) Her weight is up to 40 pounds over the past 6 months. I have stressed to her the importance of weight loss.  Problem # 3:  ATRIAL FIBRILLATION (ICD-427.31) her rhythm is holding normal sinus. We've made no medication changes at this time apart from encouraging diuresis and monitoring her fluid intake.  Her updated medication list for this problem includes:    Warfarin Sodium 5 Mg Tabs (Warfarin sodium) .[name] [name]... Per instructions: 1/2 tab alternating per the sheet with 1 whole tab.    Flecainide Acetate 100 Mg Tabs (Flecainide acetate) .[name] [name]... Take one tablet by mouth every 12 hours    Aspirin 81 Mg Tbec (Aspirin) .[name] [name]... Take one tablet by mouth daily  Orders: EKG w/ Interpretation (93000)

## 2010-06-13 NOTE — Consult Note (Signed)
[facility]   [facility]   Imported By: Roderic Ovens 04/17/2010 16:04:35  _____________________________________________________________________  External Attachment:    Type:   Image     Comment:   External Document

## 2010-06-13 NOTE — Letter (Signed)
Summary: Generic Letter  [facility] at [facility]  [address] [place], [place] [address]   Phone: [phone]  Fax: [phone]    10/31/2009  [name] [name] DOB 02-Nov-1931 [address] [place], [place]  [address]    Please draw Protime on patient today 10/31/09 (dx code 427.31 Atrial Fib) Fax results to [phone].          Thank You,    Kerby Nora MD  Liane Comber CMA (AAMA)

## 2010-06-13 NOTE — Letter (Signed)
Summary: Clearance Letter  Architectural technologist at [facility] [address]   [place], [place] [address]   Phone: [phone]  Fax: [phone]    February 13, 2010  Re:     [facility] Address:   [address]     [place], [place]  [address] DOB:     1931-09-09 MRN:     [id number]   Dear Dr. Colette Ribas  The above named patient may stop coumadin for 5 days prior to surgery.            Sincerely,  Arvilla Meres, MD

## 2010-06-13 NOTE — Assessment & Plan Note (Signed)
Summary: EPH/AMD   Visit Type:  Follow-up Primary Provider:  Kerby Nora MD  CC:  feeling good, no cp, no sob, and no edema.  History of Present Illness: 77 y/o woman with h/o morbid obesity, atrial fib maintaining SR on flecainide, PE 2007, diastolic HF, borderline OSA by sleep study, nonobstructive CAD by cath 8/10 and small AAA.   Admittted for overnight observation due to CP and mild diastolic HF in 06/2008. Ruled out for MI and discharged after mild diuresis.  Returns for f/u. Doing well. No further CP or SOB. No edema. Continues to watch her diet closely. No palpitations.   Current Medications (verified): 1)  Cardizem Cd 120 Mg Xr24h-Cap (Diltiazem Hcl Coated Beads) .[name].. 1 Tab By Mouth Daily 2)  Warfarin Sodium 5 Mg  Tabs (Warfarin Sodium) .... Per Instructions: 1/2 Tab Alternating Per The Sheet With 1 Whole Tab. 3)  Metolazone 2.5 Mg Tabs (Metolazone) .[name].. 1 By Mouth Daily If Needed Only To Take If Furosemide Is Not Working. 4)  Lipitor 40 Mg Tabs (Atorvastatin Calcium) .[name].. 1 By Mouth Daily 5)  Zyrtec Allergy 10 Mg Tabs (Cetirizine Hcl) .[name].. 1 By Mouth Daily As Needed 6)  Torsemide 20 Mg Tabs (Torsemide) .... Take 1 Tablet By Mouth Two Times A Day 7)  Effexor Xr 75 Mg Xr24h-Cap (Venlafaxine Hcl) .[name].. 1 Tab By Mouth Daily 8)  Flecainide Acetate 100 Mg Tabs (Flecainide Acetate) .... Take One Tablet By Mouth Every 12 Hours 9)  Aspirin 81 Mg Tbec (Aspirin) .... Take One Tablet By Mouth Daily 10)  Klor-Con M20 20 Meq Cr-Tabs (Potassium Chloride Crys Cr) .[name].. 1 Tab By Mouth Daily  Allergies (verified): 1)  ! Amoxicillin  Past History:  Past Medical History: Last updated: 03/02/2009  1. Chronic diastolic congestive heart failure with mild cor pulmonale          a. ECHO 6/08:  EF 55-60%, mild LVH,  mildly dilated RV w. mildly dec fx, RVSP 67  2. Chronic atrial fibrillation.  Failed cardioversion in past       --repeat DC-CV on october 5th, 2010 on flecainide  3. History of  PE/DVT 2007  4. Obstructive sleep apnea.  The patient is not using CPAP.   5. Shingles with postherpetic neuralgia.   6. Hypertension.   7. Morbid obesity.   8. GERD  9. h/o shingles c/b postherpetic neuralgia 10.Hyperlipidemia 11.h/o meningitis 12.Gallstones s/p cholecystectomy 13. Nonobstructive CAD by cath 8/10     --LAD 40-50%. with mild PAH  mean 29 with PVR 3.2 Woods 14. Small AAA     Review of Systems       As per HPI and past medical history; otherwise all systems negative.   Vital Signs:  Patient profile:   77 year old female Height:      64 inches Weight:      281.25 pounds BMI:     48.45 Pulse rate:   80 / minute Pulse rhythm:   regular BP sitting:   110 / 66  (left arm) Cuff size:   large  Vitals Entered By: Mercer Pod (July 21, 2009 2:57 PM)  Physical Exam  General:  General:  Gen: well appearing. no resp difficulty HEENT: normal Neck: supple. no JVD. Carotids 2+ bilat; no bruits.  Cor: PMI nonpalpable. Regular rate & rhythm. No rubs, gallops. soft systolic murmur LUSB Lungs: clear Abdomen: morbidly obese. soft, nontender, nondistended.  No bruits or masses. Good bowel sounds. Extremities: no cyanosis, clubbing, rash, brawny chronic venous  statsis changes.  no edema Neuro: alert and oriented. cranial nerves grossly intact. no focal weakness. affect is very pleasant    Impression & Recommendations:  Problem # 1:  DIASTOLIC HEART FAILURE, CHRONIC (ICD-428.32) Doing great. Volume status looks good. Continue current regimen.  Problem # 2:  ATRIAL FIBRILLATION (ICD-427.31) Maintaining SR on flecainide. No problems with coumadin.  Problem # 3:  OBESITY-MORBID (>100') (ICD-278.01) Encouraged her to get more exercise.   Patient Instructions: 1)  Your physician recommends that you schedule a follow-up appointment in: 4-5 months 2)  Your physician recommends that you continue on your current medications as directed. Please refer to the Current  Medication list given to you today.

## 2010-06-13 NOTE — Consult Note (Signed)
Summary: Dr.John Johnston,[facility]  Dr.John Johnston,[facility]   Imported By: Beau Fanny 01/24/2010 13:22:46  _____________________________________________________________________  External Attachment:    Type:   Image     Comment:   External Document

## 2010-06-13 NOTE — Assessment & Plan Note (Signed)
Summary: rov   Visit Type:  Follow-up Primary [name]:  Kerby Nora MD  CC:  c/o shortness of breath and swelling in legs and feet and ankles. .  History of Present Illness: [name] [name] is a 77 y/o woman with h/o morbid obesity, atrial fib maintaining SR on flecainide, DVT on the left and PE in 2007, diastolic HF, borderline OSA by sleep study, nonobstructive CAD by cath 8/10 and small AAA.  Recently in the hospital for a fall with severe wound on L knee. Requiring 3u RBCs and open debridement. Schedule for repeat debridement tomorrow.   Heart failure has been well controlled. No palpitations. Still on coumadin. Now on home O2 due to exertional desaturations. No fevers or chills.   Current Medications (verified): 1)  Cardizem Cd 120 Mg Xr24h-Cap (Diltiazem Hcl Coated Beads) .[name].. 1 Tab By Mouth Daily 2)  Warfarin Sodium 5 Mg  Tabs (Warfarin Sodium) .... Per Instructions: 1/2 Tab Alternating Per The Sheet With 1 Whole Tab. 3)  Metolazone 2.5 Mg Tabs (Metolazone) .[name].. 1 By Mouth Daily If Needed Only To Take If Furosemide Is Not Working. 4)  Lipitor 40 Mg Tabs (Atorvastatin Calcium) .[name].. 1 By Mouth Daily 5)  Zyrtec Allergy 10 Mg Tabs (Cetirizine Hcl) .[name].. 1 By Mouth Daily As Needed 6)  Torsemide 20 Mg Tabs (Torsemide) .... Take Two Tablets Daily 7)  Effexor Xr 75 Mg Xr24h-Cap (Venlafaxine Hcl) .[name].. 1 Tab By Mouth Daily 8)  Flecainide Acetate 100 Mg Tabs (Flecainide Acetate) .... Take One Tablet By Mouth Every 12 Hours 9)  Aspirin 81 Mg Tbec (Aspirin) .... Take One Tablet By Mouth Daily 10)  Klor-Con M20 20 Meq Cr-Tabs (Potassium Chloride Crys Cr) .[name].. 1 Tab By Mouth Daily 11)  Lidoderm 5 % Ptch (Lidocaine) .... Apply Daily, 12 Hours On and 12 Hours Off. 12)  Lyrica 75 Mg Caps (Pregabalin) .[name].. 1 Tab By Mouth Two Times Daily  3-4 Days Then Increase To 2 Tabs By Mouth Two Times A Day 13)  Oxygen  2 Liters .... Daily 14)  Hydrocodone-Acetaminophen 5-325 Mg Tabs (Hydrocodone-Acetaminophen) .[name].. 1-2  Tablet Every Four Hours As Needed 15)  Singulair 10 Mg Tabs (Montelukast Sodium) .... One Tablet Once Daily 16)  Senna-Plus 8.6-50 Mg Tabs (Sennosides-Docusate Sodium) .... Two Tablets Two Times A Day 17)  Venlafaxine Hcl 75 Mg Tabs (Venlafaxine Hcl) .... One Tablet As Needed 18)  Advair Diskus 250-50 Mcg/dose Aepb (Fluticasone-Salmeterol) .... Inhale One Puff Two Times A Day  Allergies (verified): 1)  ! Amoxicillin  Past History:  Past Medical History: Last updated: 03/02/2009  1. Chronic diastolic congestive heart failure with mild cor pulmonale          a. ECHO 6/08:  EF 55-60%, mild LVH,  mildly dilated RV w. mildly dec fx, RVSP 67  2. Chronic atrial fibrillation.  Failed cardioversion in past       --repeat DC-CV on october 5th, 2010 on flecainide  3. History of PE/DVT 2007  4. Obstructive sleep apnea.  The patient is not using CPAP.   5. Shingles with postherpetic neuralgia.   6. Hypertension.   7. Morbid obesity.   8. GERD  9. h/o shingles c/b postherpetic neuralgia 10.Hyperlipidemia 11.h/o meningitis 12.Gallstones s/p cholecystectomy 13. Nonobstructive CAD by cath 8/10     --LAD 40-50%. with mild PAH  mean 29 with PVR 3.2 Woods 14. Small AAA     Past Surgical History: Last updated: 03/03/2007 Appendectomy- 1947 Tonsillectomy- 1946 Hospitalized with mening[facility] at [facility]- 1960  Cardioversion- 01/2007  Family History: Last updated: 12/04/2006 Mother died age 58.5 of CHF and depression Father died at age 72 of a cerebral hemorrhage, also had a heart defect from birth. Paternal grandmother died of ovarian cancer. Family history of heart disease and obesity.  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   Vital Signs:  Patient profile:   77 year old female Height:      64 inches Weight:      310 pounds BMI:     53.40 Pulse rate:   72 / minute BP sitting:   110 / 68  (left arm) Cuff size:   large  Vitals Entered By: Bishop Dublin, CMA (February 13, 2010 1:58 PM)  Physical Exam  General:  sitting in wheellchair. no resp difficulty HEENT: normal. ecchymosis on chin Neck: supple. no JVD. Carotids 2+ bilat; no bruits.  Cor: PMI nonpalpable. Regular rate & rhythm. No rubs, gallops. soft systolic murmur LUSB Lungs: clear Abdomen: morbidly obese. soft, nontender, nondistended.  No bruits or masses. Good bowel sounds. Extremities: no cyanosis, clubbing, rash, brawny chronic venous statsis changes.  no edema.R knee bandaged Neuro: alert and oriented. cranial nerves grossly intact. no focal weakness. affect is very pleasant    Impression & Recommendations:  Problem # 1:  DIASTOLIC HEART FAILURE, CHRONIC (ICD-428.32) Volume status looks good. Continue on toresemide  Problem # 2:  ATRIAL FIBRILLATION (ICD-427.31) Maintaining SR on flecainide. Ok to stop coumadin for wound debridement.   Patient Instructions: 1)  Your physician recommends that you continue on your current medications as directed. Please refer to the Current Medication list given to you today. 2)  Your physician wants you to follow-up in:   3 months You will receive a reminder letter in the mail two months in advance. If you don't receive a letter, please call our office to schedule the follow-up appointment.

## 2010-06-14 ENCOUNTER — Encounter: Payer: Self-pay | Admitting: Family Medicine

## 2010-06-14 ENCOUNTER — Ambulatory Visit (INDEPENDENT_AMBULATORY_CARE_PROVIDER_SITE_OTHER): Payer: Medicare Other

## 2010-06-14 DIAGNOSIS — Z5181 Encounter for therapeutic drug level monitoring: Secondary | ICD-10-CM

## 2010-06-14 DIAGNOSIS — I2699 Other pulmonary embolism without acute cor pulmonale: Secondary | ICD-10-CM

## 2010-06-14 DIAGNOSIS — Z7901 Long term (current) use of anticoagulants: Secondary | ICD-10-CM

## 2010-06-14 NOTE — Miscellaneous (Signed)
Summary: SN Orders/[facility]  SN Orders/[facility]   Imported By: Lanelle Bal 05/15/2010 09:27:26  _____________________________________________________________________  External Attachment:    Type:   Image     Comment:   External Document

## 2010-06-14 NOTE — Progress Notes (Signed)
Summary: reporting PT, INR  Phone Note From Other Clinic   Caller: nurse with [facility]  [phone] Summary of Call: Nurse reports PT of 52.7, INR of 4.4.  Pt is taking 5 mg's of warfarin daily.  Please advise on when to recheck. Initial call taken by: Lowella Petties CMA, AAMA,  April 30, 2010 2:54 PM  Follow-up for Phone Call        hold next dose  change dosing to 2.5 mg by mouth q wednesday then 5 mg every other day  recheck 2 weeks. Hannah Beat MD  April 30, 2010 3:06 PM   Additional Follow-up for Phone Call Additional follow up Details #1::        Spoke with [facility]  [phone], Gave her dose instructions, she will collecte next INR Jan 2,2012. She will also notify patient.  Additional Follow-up by: Mills Koller,  April 30, 2010 3:38 PM

## 2010-06-14 NOTE — Miscellaneous (Signed)
Summary: PT INR Orders/[facility]  PT INR Orders/[facility]   Imported By: Lanelle Bal 05/08/2010 14:56:41  _____________________________________________________________________  External Attachment:    Type:   Image     Comment:   External Document

## 2010-06-14 NOTE — Progress Notes (Signed)
Summary: PT/ INR results  Phone Note From Other Clinic   Caller: Veto Kemps with [facility] [phone] Summary of Call: Nurse called with Pt/ INR readings- PT30.1, INR 2.5.  Pt is taking 5 mg's of coumadin daily.  Nurse is asking when to recheck. Initial call taken by: Lowella Petties CMA, AAMA,  April 23, 2010 3:11 PM  Follow-up for Phone Call        2 weeks keep doing current dose Follow-up by: Hannah Beat MD,  April 23, 2010 3:20 PM  Additional Follow-up for Phone Call Additional follow up Details #1::        Patient advised.Consuello Masse CMA   Additional Follow-up by: Benny Lennert CMA Duncan Dull),  April 23, 2010 3:37 PM     Appended Document: PT/ INR results Advised [name] [name] with [facility] to recheck on 12/26, continue current dose of 5 mg's daily.

## 2010-06-14 NOTE — Miscellaneous (Signed)
Summary: Physician's Orders/[facility]  Physician's Orders/[facility]   Imported By: Maryln Gottron 04/27/2010 15:30:27  _____________________________________________________________________  External Attachment:    Type:   Image     Comment:   External Document

## 2010-06-20 NOTE — Medication Information (Signed)
Summary: [facility]  Anticoagulant Therapy  Managed by: Cloyde Reams, RN, BSN PCP: Kerby Nora MD Supervising MD: Gala Romney MD, Reuel Boom Indication 1: Deep venous thrombosis PT 34.6 INR RANGE 2.5-3.5           Allergies: 1)  ! Amoxicillin  Anticoagulation Management History:      Positive risk factors for bleeding include an age of 62 years or older.  The bleeding index is 'intermediate risk'.  Positive CHADS2 values include History of CHF, History of HTN, and Age > 45 years old.  Her last INR was 2.6 and today's INR is 2.9.  Prothrombin time is 34.6.  Anticoagulation responsible provider: Bensimhon MD, Reuel Boom.  Exp: 02/2011.    Anticoagulation Management Assessment/Plan:      The patient's current anticoagulation dose is Warfarin sodium 5 mg  tabs: Per instructions: 1/2 tab alternating per the sheet with 1 whole tab., Warfarin sodium 1 mg tabs: take as directed.  The target INR is 2.5-3.5.  The next INR is due 4 weeks.  Anticoagulation instructions were given to patient.  Results were reviewed/authorized by Cloyde Reams, RN, BSN.         Prior Anticoagulation Instructions: INR 2.7  Continue taking 1 tab daily except for 1/2 tab on Monday, Wednesday and Friday.   Laboratory Results   Blood Tests   Date/Time Recieved: June 14, 2010 2:37 PM  Date/Time Reported: June 14, 2010 2:37 PM   PT: 34.6 s   (Normal Range: 10.6-13.4)  INR: 2.9   (Normal Range: 0.88-1.12   Therap INR: 2.0-3.5)      ANTICOAGULATION RECORD PREVIOUS REGIMEN & LAB RESULTS Anticoagulation Diagnosis:  Deep venous thrombosis on  06/16/2009 Previous INR Goal Range:  2.5-3.5 on  06/16/2009 Previous INR:  2.6 on  03/13/2010 Previous Coumadin Dose(mg):  5mg  sat,sunday 2.5 everyother day on  03/02/2010 Previous Regimen:  5mg  daily on  03/02/2010 Previous Coagulation Comments:  . on  03/10/2009  NEW REGIMEN & LAB RESULTS Current INR: 2.9 Regimen: 5mg  daily  (no change)  Provider:  bedsole      Repeat testing in: 4 weeks MEDICATIONS CARDIZEM CD 120 MG XR24H-CAP (DILTIAZEM HCL COATED BEADS) 1 tab by mouth daily WARFARIN SODIUM 5 MG  TABS (WARFARIN SODIUM) Per instructions: 1/2 tab alternating per the sheet with 1 whole tab. METOLAZONE 2.5 MG TABS (METOLAZONE) 1 by mouth daily if needed only to take if furosemide is not working. LIPITOR 40 MG TABS (ATORVASTATIN CALCIUM) 1 by mouth daily ZYRTEC ALLERGY 10 MG TABS (CETIRIZINE HCL) 1 by mouth daily as needed TORSEMIDE 20 MG TABS (TORSEMIDE) take two tablets daily EFFEXOR XR 75 MG XR24H-CAP (VENLAFAXINE HCL) 1 tab by mouth daily FLECAINIDE ACETATE 100 MG TABS (FLECAINIDE ACETATE) Take one tablet by mouth every 12 hours ASPIRIN 81 MG TBEC (ASPIRIN) Take one tablet by mouth daily KLOR-CON M20 20 MEQ CR-TABS (POTASSIUM CHLORIDE CRYS CR) 1 tab by mouth daily LIDODERM 5 % PTCH (LIDOCAINE) Apply daily, 12 hours on and 12 hours off. LYRICA 75 MG CAPS (PREGABALIN) 1 tab by mouth two times daily  3-4 days then increase to 2 tabs by mouth two times a day * OXYGEN  2 LITERS daily HYDROCODONE-ACETAMINOPHEN 5-325 MG TABS (HYDROCODONE-ACETAMINOPHEN) 1-2 tablet every four hours as needed VENLAFAXINE HCL 75 MG TABS (VENLAFAXINE HCL) one tablet as needed ADVAIR DISKUS 250-50 MCG/DOSE AEPB (FLUTICASONE-SALMETEROL) Inhale one puff two times a day WARFARIN SODIUM 1 MG TABS (WARFARIN SODIUM) take as directed  Dose has been reviewed with  patient or caretaker during this visit.  Reviewed by: Allison Quarry  Anticoagulation Visit Questionnaire      Coumadin dose missed/changed:  No      Abnormal Bleeding Symptoms:  No Any diet changes including alcohol intake, vegetables or greens since the last visit:  No Any illnesses or hospitalizations since the last visit:  No Any signs of clotting since the last visit (including chest discomfort, dizziness, shortness of breath, arm tingling, slurred speech, swelling or redness in leg):  No

## 2010-06-20 NOTE — Progress Notes (Signed)
Summary: wants referral for sleep study  Phone Note Call from Patient Call back at Home Phone [phone] Call back at [phone]   Caller: Patient Call For: Kerby Nora MD Summary of Call: Pt states she has sleep apnea and has CPAP machine, according to her son who is staying with her she is holding her breath longer.  She is asking for a referral to have another sleep study so that proper adjustments can be made to her machine.    She does not want to go to "the building behind the hospital" Initial call taken by: Lowella Petties CMA, AAMA,  June 08, 2010 4:33 PM  Follow-up for Phone Call        Typically do sleep testing through a pulmonologist so that titrationscan be made.. did she ever see a pulm in October as referred at that time?  I will send in referral but will also forward this note to [facility].  Follow-up by: Kerby Nora MD,  June 08, 2010 4:43 PM  Additional Follow-up for Phone Call Additional follow up Details #1::        Pulm Sleep Dr Craige Cotta on 07/03/2010 at 2:45pm. Carlton Adam  June 12, 2010 10:12 AM  Additional Follow-up by: Carlton Adam,  June 12, 2010 10:13 AM

## 2010-07-03 ENCOUNTER — Encounter: Payer: Self-pay | Admitting: Pulmonary Disease

## 2010-07-03 ENCOUNTER — Institutional Professional Consult (permissible substitution) (INDEPENDENT_AMBULATORY_CARE_PROVIDER_SITE_OTHER): Payer: Medicare Other | Admitting: Pulmonary Disease

## 2010-07-03 DIAGNOSIS — G4733 Obstructive sleep apnea (adult) (pediatric): Secondary | ICD-10-CM

## 2010-07-03 DIAGNOSIS — G4736 Sleep related hypoventilation in conditions classified elsewhere: Secondary | ICD-10-CM

## 2010-07-06 ENCOUNTER — Encounter: Payer: Self-pay | Admitting: Pulmonary Disease

## 2010-07-08 ENCOUNTER — Encounter: Payer: Self-pay | Admitting: Pulmonary Disease

## 2010-07-09 ENCOUNTER — Other Ambulatory Visit (INDEPENDENT_AMBULATORY_CARE_PROVIDER_SITE_OTHER): Payer: Medicare Other

## 2010-07-09 ENCOUNTER — Other Ambulatory Visit: Payer: Self-pay | Admitting: Family Medicine

## 2010-07-09 ENCOUNTER — Ambulatory Visit (INDEPENDENT_AMBULATORY_CARE_PROVIDER_SITE_OTHER): Payer: Medicare Other

## 2010-07-09 ENCOUNTER — Encounter (INDEPENDENT_AMBULATORY_CARE_PROVIDER_SITE_OTHER): Payer: Self-pay | Admitting: *Deleted

## 2010-07-09 ENCOUNTER — Encounter: Payer: Self-pay | Admitting: Family Medicine

## 2010-07-09 DIAGNOSIS — Z5181 Encounter for therapeutic drug level monitoring: Secondary | ICD-10-CM

## 2010-07-09 DIAGNOSIS — E7800 Pure hypercholesterolemia, unspecified: Secondary | ICD-10-CM

## 2010-07-09 DIAGNOSIS — I10 Essential (primary) hypertension: Secondary | ICD-10-CM

## 2010-07-09 DIAGNOSIS — Z7901 Long term (current) use of anticoagulants: Secondary | ICD-10-CM

## 2010-07-09 DIAGNOSIS — Z79899 Other long term (current) drug therapy: Secondary | ICD-10-CM

## 2010-07-09 DIAGNOSIS — T81718A Complication of other artery following a procedure, not elsewhere classified, initial encounter: Secondary | ICD-10-CM

## 2010-07-09 LAB — HEPATIC FUNCTION PANEL
Albumin: 3.3 g/dL — ABNORMAL LOW (ref 3.5–5.2)
Alkaline Phosphatase: 92 U/L (ref 39–117)
Bilirubin, Direct: 0.1 mg/dL (ref 0.0–0.3)
Total Bilirubin: 0.6 mg/dL (ref 0.3–1.2)
Total Protein: 7.1 g/dL (ref 6.0–8.3)

## 2010-07-09 LAB — CBC WITH DIFFERENTIAL/PLATELET
Basophils Absolute: 0 10*3/uL (ref 0.0–0.1)
Hemoglobin: 15.1 g/dL — ABNORMAL HIGH (ref 12.0–15.0)
Lymphocytes Relative: 12.3 % (ref 12.0–46.0)
Monocytes Relative: 5.9 % (ref 3.0–12.0)
Neutro Abs: 8.2 10*3/uL — ABNORMAL HIGH (ref 1.4–7.7)
Platelets: 275 10*3/uL (ref 150.0–400.0)
RDW: 17.9 % — ABNORMAL HIGH (ref 11.5–14.6)

## 2010-07-09 LAB — BASIC METABOLIC PANEL
BUN: 21 mg/dL (ref 6–23)
Chloride: 95 mEq/L — ABNORMAL LOW (ref 96–112)
Creatinine, Ser: 1 mg/dL (ref 0.4–1.2)
GFR: 57.18 mL/min — ABNORMAL LOW (ref >60.00)
Potassium: 3.4 mEq/L — ABNORMAL LOW (ref 3.5–5.1)

## 2010-07-09 LAB — LIPID PANEL
LDL Cholesterol: 127 mg/dL — ABNORMAL HIGH (ref 0–99)
Total CHOL/HDL Ratio: 4
VLDL: 24.6 mg/dL (ref 0.0–40.0)

## 2010-07-09 LAB — TSH: TSH: 3.11 u[IU]/mL (ref 0.35–5.50)

## 2010-07-09 LAB — CONVERTED CEMR LAB
INR: 3.1
Prothrombin Time: 37.2 s

## 2010-07-13 ENCOUNTER — Encounter (INDEPENDENT_AMBULATORY_CARE_PROVIDER_SITE_OTHER): Payer: Medicare Other | Admitting: Family Medicine

## 2010-07-13 ENCOUNTER — Encounter: Payer: Self-pay | Admitting: Family Medicine

## 2010-07-13 DIAGNOSIS — I10 Essential (primary) hypertension: Secondary | ICD-10-CM

## 2010-07-13 DIAGNOSIS — Z23 Encounter for immunization: Secondary | ICD-10-CM

## 2010-07-13 DIAGNOSIS — Z0000 Encounter for general adult medical examination without abnormal findings: Secondary | ICD-10-CM

## 2010-07-13 DIAGNOSIS — E7800 Pure hypercholesterolemia, unspecified: Secondary | ICD-10-CM

## 2010-07-13 LAB — CONVERTED CEMR LAB: Cholesterol, target level: 200 mg/dL

## 2010-07-19 NOTE — Letter (Signed)
Summary: [facility]: Immunoassay Fecal Occult Blood (iFOB) Order Form  [place] [facility]  [address] [place], [place] [address]   Phone: [phone]  Fax: [phone]      [facility]: Immunoassay Fecal Occult Blood (iFOB) Order Form   July 13, 2010 MRN: [id number]   [facility] [place] 16-Apr-1932   Physicican Name:__________bedsole_______________  Diagnosis Code:____________v76.41______________      Kerby Nora MD

## 2010-07-19 NOTE — Assessment & Plan Note (Signed)
Summary: CPX/JRR   Vital Signs:  Patient profile:   77 year old female Height:      64 inches Weight:      295.50 pounds BMI:     50.91 Temp:     97.7 degrees F oral Pulse rate:   84 / minute Pulse rhythm:   regular BP sitting:   110 / 76  (left arm) Cuff size:   large  Vitals Entered By: Benny Lennert CMA Duncan Dull) (July 13, 2010 2:42 PM) CC: Lipid Management Is Patient Diabetic? Yes  Vision Screening:      Vision Comments: patient does wear glasses  Vision Entered By: Benny Lennert CMA Duncan Dull) (July 13, 2010 2:43 PM)  Hearing Screen 40db HL: Left  Right  Audiometry Comment: patient can hear whisper from cross the room    History of Present Illness: Chief complaint cpx ([employer]) I have personally reviewed the Medicare Annual Wellness questionnaire and have noted 1.   The patient's medical and social history 2.   Their use of alcohol, tobacco or illicit drugs 3.   Their current medications and supplements 4.   The patient's functional ability including ADL's, fall risks, home safety risks and hearing or visual             impairment. 5.   Diet and physical activities 6.   Evidence for depression or mood disorders The patients weight, height, BMI and visual acuity have been recorded in the chart I have made referrals, counseling and provided education to the patient based review of the above and I have provided the pt with a written personalized care plan for preventive services.   Multiple hospitalizations in last 6 months  01/2010:Hospitalized after fall. Leg wound is healed, s/p wound vac.  03/2010.. hospitalized for for overmedication with pain pill. Went into coma.. in ICU...for several days.  Stopped lipitor and lyrica... given liver irritation.  Of note liver function is back in nml range on labs today.  CHF.[name] stable euvolemic on 2 tabs by mouth daily of torsemide. SOB improved. Minimal exercise but trying to increase.  Sleep apnea on CPAP...  followed by Pulm.. Dr Craige Cotta.  Potassium slightly low this check.   HTN, well controlled on current meds.  Depression, well controlled on venlafaxine.   Lipid Management History:      Positive NCEP/ATP III risk factors include female age 34 years old or older, hypertension, peripheral vascular disease, and aortic aneurysm.  Negative NCEP/ATP III risk factors include non-tobacco-user status.        The patient expresses understanding of adjunctive measures for cholesterol lowering.  Adjunctive measures started by the patient include aerobic exercise, limit alcohol consumpton, and weight reduction.  Comments include: Off statin due to liver failure in fall with coma.     Preventive Screening-Counseling & Management  Alcohol-Tobacco     Alcohol drinks/day: 0     Smoking Status: never  Caffeine-Diet-Exercise     Diet Comments: moderate     Nutrition Referrals: no     Does Patient Exercise: no     Exercise Counseling: to improve exercise regimen  Problems Prior to Update: 1)  Pure Hypercholesterolemia  (ICD-272.0) 2)  Other Malaise and Fatigue  (ICD-780.79) 3)  Sleep Related Hypoventilation/hypoxemia Cce  (ICD-327.26) 4)  Wound, Open, Leg, With Complication  (ICD-891.1) 5)  Hypoxemia  (ICD-799.02) 6)  Obesity-morbid (>100')  (ICD-278.01) 7)  Diastolic Heart Failure, Chronic  (ICD-428.32) 8)  Abdominal Aneurysm Without Mention of Rupture  (ICD-441.4) 9)  Postherpetic Neuralgia  (ICD-053.19) 10)  Screening For Lipoid Disorders  (ICD-V77.91) 11)  Allergic Rhinitis  (ICD-477.9) 12)  Depression, Acute  (ICD-296.23) 13)  Cerumen Impaction, Bilateral  (ICD-380.4) 14)  Sob  (ICD-786.05) 15)  Atrial Fibrillation  (ICD-427.31) 16)  Meningitis, Hx of  (ICD-V12.49) 17)  Shingles, Hx of  (ICD-V13.8) 18)  Obstructive Sleep Apnea  (ICD-327.23) 19)  Hypertension  (ICD-401.9) 20)  Fibrillation, Atrial  (ICD-427.31) 21)  Thrombosis, Venous Nos  (ICD-453.9) 22)  Embolism & Infarction, Iatrogenic  Pulmonary  (ICD-415.11) 23)  Aftercare, Long-term Use, Anticoagulants  (ICD-V58.61) 24)  Encounter For Therapeutic Drug Monitoring  (ICD-V58.83)  Current Medications (verified): 1)  Cardizem Cd 120 Mg Xr24h-Cap (Diltiazem Hcl Coated Beads) .[name].. 1 Tab By Mouth Daily 2)  Warfarin Sodium 5 Mg  Tabs (Warfarin Sodium) .... Per Instructions: 1/2 Tab Alternating Per The Sheet With 1 Whole Tab. 3)  Zyrtec Allergy 10 Mg Tabs (Cetirizine Hcl) .[name].. 1 By Mouth Daily As Needed 4)  Torsemide 20 Mg Tabs (Torsemide) .... Take Two Tablets Daily 5)  Aspirin 81 Mg Tbec (Aspirin) .... Take One Tablet By Mouth Daily 6)  Klor-Con M20 20 Meq Cr-Tabs (Potassium Chloride Crys Cr) .... 2 Tab By Mouth Daily 7)  Oxygen  2 Liters .... Daily As Needed and Uses At Night With Cpap 8)  Venlafaxine Hcl 75 Mg Tabs (Venlafaxine Hcl) .... One Tablet As Needed 9)  Cpap .... At Bedtime  Allergies: 1)  ! Amoxicillin  Past History:  Past medical, surgical, family and social histories (including risk factors) reviewed, and no changes noted (except as noted below).  Past Medical History: Reviewed history from 03/02/2009 and no changes required.  1. Chronic diastolic congestive heart failure with mild cor pulmonale          a. ECHO 6/08:  EF 55-60%, mild LVH,  mildly dilated RV w. mildly dec fx, RVSP 67  2. Chronic atrial fibrillation.  Failed cardioversion in past       --repeat DC-CV on october 5th, 2010 on flecainide  3. History of PE/DVT 2007  4. Obstructive sleep apnea.  The patient is not using CPAP.   5. Shingles with postherpetic neuralgia.   6. Hypertension.   7. Morbid obesity.   8. GERD  9. h/o shingles c/b postherpetic neuralgia 10.Hyperlipidemia 11.h/o meningitis 12.Gallstones s/p cholecystectomy 13. Nonobstructive CAD by cath 8/10     --LAD 40-50%. with mild PAH  mean 29 with PVR 3.2 [place] 14. Small AAA     Past Surgical History: Reviewed history from 03/03/2007 and no changes required. Appendectomy-  1947 Tonsillectomy- 1946 Hospitalized with meningitis at [facility]- 1960 Cardioversion- 01/2007  Family History: Reviewed history from 11/25/2006 and no changes required. Mother died age 61.5 of CHF and depression Father died at age 5 of a cerebral hemorrhage, also had a heart defect from birth. Paternal grandmother died of ovarian cancer. Family history of heart disease and obesity.  Social History: Reviewed history from 11/25/2006 and no changes required. Occupation:[employer]er[employer] shop for many years Married: 56 years Never Smoked Alcohol use-yes Regular exercise-no  Review of Systems General:  Complains of fatigue; denies fever. CV:  Denies chest pain or discomfort. Resp:  Denies shortness of breath. GI:  Denies abdominal pain and bloody stools. GU:  Denies dysuria.  Physical Exam  General:  NAD, morbidly obese Mouth:  Oral mucosa and oropharynx without lesions or exudates.  Teeth in good repair. Neck:  no carotid bruit or thyromegaly no cervical or supraclavicular lymphadenopathy  Breasts:  refused  Lungs:  Normal respiratory effort, chest expands symmetrically. No rales. Heart:  Normal rate and regular rhythm. S1 and S2 normal without gallop, murmur, click, rub or other extra sounds. Abdomen:  Bowel sounds positive,abdomen soft and non-tender without masses, organomegaly or hernias noted. Genitalia:  not indicated Pulses:  No edemabut  chronic venous changes Extremities:  R and L posterior tibial pulses are full and equal bilaterally  healing scar oin left leg Skin:  Intact without suspicious lesions or rashes Psych:  Cognition and judgment appear intact. Alert and cooperative with normal attention span and concentration. No apparent delusions, illusions, hallucinations   Impression & Recommendations:  Problem # 1:  Preventive Health Care (ICD-V70.0)  The patient's preventative maintenance and recommended screening tests for an annual wellness exam were reviewed in  full today. Brought up to date unless services declined.  Counselled on the importance of diet, exercise, and its role in overall health and mortality. The patient's FH and SH was reviewed, including their home life, tobacco status, and drug and alcohol status.     Orders: Medicare -1st Annual Wellness Visit [phone])  Problem # 2:  HYPERTENSION (ICD-401.9) Well controlled. Continue current medication.  Potassium low.. increase potassium supplement. Recheck next lab visit.  The following medications were removed from the medication list:    Metolazone 2.5 Mg Tabs (Metolazone) .[name]... 1 by mouth daily if needed only to take if furosemide is not working. Her updated medication list for this problem includes:    Cardizem Cd 120 Mg Xr24h-cap (Diltiazem hcl coated beads) .[name]... 1 tab by mouth daily    Torsemide 20 Mg Tabs (Torsemide) .[name]... Take two tablets daily  Problem # 3:  PURE HYPERCHOLESTEROLEMIA (ICD-272.0) Info given. Encouraged exercise, weight loss, healthy eating habits. Recheck in 3 months.   Problem # 4:  ATRIAL FIBRILLATION (ICD-427.31) Stable per cards.  The following medications were removed from the medication list:    Flecainide Acetate 100 Mg Tabs (Flecainide acetate) .[name]... Take one tablet by mouth every 12 hours Her updated medication list for this problem includes:    Cardizem Cd 120 Mg Xr24h-cap (Diltiazem hcl coated beads) .[name]... 1 tab by mouth daily    Warfarin Sodium 5 Mg Tabs (Warfarin sodium) .[name]... Per instructions: 1/2 tab alternating per the sheet with 1 whole tab.    Aspirin 81 Mg Tbec (Aspirin) .[name]... Take one tablet by mouth daily  Problem # 5:  DIASTOLIC HEART FAILURE, CHRONIC (ICD-428.32)  The following medications were removed from the medication list:    Metolazone 2.5 Mg Tabs (Metolazone) .[name]... 1 by mouth daily if needed only to take if furosemide is not working. Her updated medication list for this problem includes:    Warfarin Sodium 5 Mg Tabs (Warfarin  sodium) .[name]... Per instructions: 1/2 tab alternating per the sheet with 1 whole tab.    Torsemide 20 Mg Tabs (Torsemide) .[name]... Take two tablets daily    Aspirin 81 Mg Tbec (Aspirin) .[name]... Take one tablet by mouth daily  Problem # 6:  EMBOLISM & INFARCTION, IATROGENIC PULMONARY (ICD-415.11) On warfarin.  Her updated medication list for this problem includes:    Warfarin Sodium 5 Mg Tabs (Warfarin sodium) .[name]... Per instructions: 1/2 tab alternating per the sheet with 1 whole tab.    Aspirin 81 Mg Tbec (Aspirin) .[name]... Take one tablet by mouth daily  Complete Medication List: 1)  Cardizem Cd 120 Mg Xr24h-cap (Diltiazem hcl coated beads) .[name].. 1 tab by mouth daily 2)  Warfarin Sodium 5 Mg Tabs (Warfarin sodium) .... Per instructions: 1/2 tab alternating per the sheet with 1 whole tab. 3)  Zyrtec Allergy 10 Mg Tabs (Cetirizine hcl) .[name].. 1 by mouth daily as needed 4)  Torsemide 20 Mg Tabs (Torsemide) .... Take two tablets daily 5)  Aspirin 81 Mg Tbec (Aspirin) .... Take one tablet by mouth daily 6)  Klor-con M20 20 Meq Cr-tabs (Potassium chloride crys cr) .... 2 tab by mouth daily 7)  Oxygen 2 Liters  .... Daily as needed and uses at night with cpap 8)  Venlafaxine Hcl 75 Mg Tabs (Venlafaxine hcl) .... One tablet as needed 9)  Cpap  .... At bedtime  Lipid Assessment/Plan:      Based on NCEP/ATP III, the patient's risk factor category is "history of coronary disease, peripheral vascular disease, cerebrovascular disease, or aortic aneurysm".  The patient's lipid goals are as follows: Total cholesterol goal is 200; LDL cholesterol goal is 100; HDL cholesterol goal is 40; Triglyceride goal is 150.  Her LDL cholesterol goal has been met.    Patient Instructions: 1)  Increase to 2 tab by mouth daily of potassium. 2)  Work on exercise and weight loss. Low fat diet. 3)   Review chol diet packet. 4)  Recheck fasting LIPIDS, AST, ALT, cbc  in 3 months Dx 272.0 , 789.09   5)  Please schedule a follow-up  appointment in 3 months .    Orders Added: 1)  Medicare -1st Annual Wellness Visit [G0438] 2)  Est. Patient Level III [42595]   Zostavax (to be given today)   Current Allergies (reviewed today): ! AMOXICILLIN  Last Flu Vaccine:  Fluvax 3+ (01/19/2010 2:20:07 PM) Flu Vaccine Next Due:  1 yr Colonoscopy Next Due:  Refused Mammogram Next Due:  Refused Bone Density Next Due: Refused  Appended Document: CPX/JRR    Clinical Lists Changes  Orders: Added new Service order of Zoster (Shingles) Vaccine Live [phone]) - Signed Added new Service order of Admin 1st Vaccine (64332) - Signed Observations: Added new observation of ZOSTAVAX VIS: 02/22/05 given July 13, 2010. (07/13/2010 15:59) Added new observation of ZOSTAVAX LOT: [id number] (07/13/2010 15:59) Added new observation of ZOSTAVAX EXP: 12/29/2010 (07/13/2010 15:59) Added new observation of ZOSTAVAX BY: Benny Lennert CMA (AAMA) (07/13/2010 15:59) Added new observation of ZOSTAVAX RTE: IM (07/13/2010 15:59) Added new observation of ZOSTAVAXDOSE: 0.5 ml (07/13/2010 15:59) Added new observation of ZOSTAVAX MFR: [employer]ck (07/13/2010 15:59) Added new observation of ZOSTAVAXSITE: right deltoid (07/13/2010 15:59) Added new observation of ZOSTAVAX: Zostavax (07/13/2010 15:59)       Immunizations Administered:  Zostavax # 1:    Vaccine Type: Zostavax    Site: right deltoid    Mfr: Merck    Dose: 0.5 ml    Route: IM    Given by: Benny Lennert CMA (AAMA)    Exp. Date: 12/29/2010    Lot #: [id number]    VIS given: 02/22/05 given July 13, 2010.

## 2010-07-19 NOTE — Medication Information (Signed)
Summary: protime/tw  Anticoagulant Therapy  Managed by: Cloyde Reams, RN, BSN PCP: Kerby Nora MD Supervising MD: Gala Romney MD, Reuel Boom Indication 1: Deep venous thrombosis PT 37.2 INR RANGE 2.5-3.5           Allergies: 1)  ! Amoxicillin  Anticoagulation Management History:      Positive risk factors for bleeding include an age of 75 years or older.  The bleeding index is 'intermediate risk'.  Positive CHADS2 values include History of CHF, History of HTN, and Age > 75 years old.  Her last INR was 2.9 and today's INR is 3.1.  Prothrombin time is 37.2.  Anticoagulation responsible provider: Bensimhon MD, Reuel Boom.  Exp: 02/2011.    Anticoagulation Management Assessment/Plan:      The patient's current anticoagulation dose is Warfarin sodium 5 mg  tabs: Per instructions: 1/2 tab alternating per the sheet with 1 whole tab..  The target INR is 2.5-3.5.  The next INR is due 4 weeks.  Anticoagulation instructions were given to patient.  Results were reviewed/authorized by Cloyde Reams, RN, BSN.         Prior Anticoagulation Instructions: INR 2.7  Continue taking 1 tab daily except for 1/2 tab on Monday, Wednesday and Friday.    ANTICOAGULATION RECORD PREVIOUS REGIMEN & LAB RESULTS Anticoagulation Diagnosis:  Deep venous thrombosis on  06/16/2009 Previous INR Goal Range:  2.5-3.5 on  06/16/2009 Previous INR:  2.9 on  06/14/2010 Previous Coumadin Dose(mg):  5mg  sat,sunday 2.5 everyother day on  03/02/2010 Previous Regimen:  5mg  daily on  03/02/2010 Previous Coagulation Comments:  . on  03/10/2009  NEW REGIMEN & LAB RESULTS Current INR: 3.1 Current Coumadin Dose(mg): 5mg  daily 2.5 mg wed Regimen: 5 mg daily, 2.5 mg wed  Provider: bedsole Repeat testing in: 4 weeks Dose has been reviewed with patient or caretaker during this visit. Reviewed by: Celso Sickle  Anticoagulation Visit Questionnaire Coumadin dose missed/changed:  No Abnormal Bleeding Symptoms:  No  Any diet  changes including alcohol intake, vegetables or greens since the last visit:  No Any illnesses or hospitalizations since the last visit:  No Any signs of clotting since the last visit (including chest discomfort, dizziness, shortness of breath, arm tingling, slurred speech, swelling or redness in leg):  No  MEDICATIONS CARDIZEM CD 120 MG XR24H-CAP (DILTIAZEM HCL COATED BEADS) 1 tab by mouth daily WARFARIN SODIUM 5 MG  TABS (WARFARIN SODIUM) Per instructions: 1/2 tab alternating per the sheet with 1 whole tab. METOLAZONE 2.5 MG TABS (METOLAZONE) 1 by mouth daily if needed only to take if furosemide is not working. ZYRTEC ALLERGY 10 MG TABS (CETIRIZINE HCL) 1 by mouth daily as needed TORSEMIDE 20 MG TABS (TORSEMIDE) take two tablets daily FLECAINIDE ACETATE 100 MG TABS (FLECAINIDE ACETATE) Take one tablet by mouth every 12 hours ASPIRIN 81 MG TBEC (ASPIRIN) Take one tablet by mouth daily KLOR-CON M20 20 MEQ CR-TABS (POTASSIUM CHLORIDE CRYS CR) 1 tab by mouth daily * OXYGEN  2 LITERS daily as needed and uses at night with cpap VENLAFAXINE HCL 75 MG TABS (VENLAFAXINE HCL) one tablet as needed * CPAP at bedtime    Laboratory Results   Blood Tests   Date/Time Received: July 09, 2010 11:10 AM Date/Time Reported: July 09, 2010 11:10 AM  PT: 37.2 s   (Normal Range: 10.6-13.4)  INR: 3.1   (Normal Range: 0.88-1.12   Therap INR: 2.0-3.5)

## 2010-07-24 NOTE — Letter (Signed)
Summary: Nature conservation officer [employer] Wellness Visit Questionnaire   [employer] Annual Wellness Visit Questionnaire   Imported By: Beau Fanny 07/16/2010 10:11:24  _____________________________________________________________________  External Attachment:    Type:   Image     Comment:   External Document

## 2010-07-24 NOTE — Assessment & Plan Note (Signed)
Summary: sleep apnea- pt has a cpap ///kp   Visit Type:  Initial Consult Copy to:  Kerby Nora MD Primary Provider/Referring Provider:  Kerby Nora MD  CC:  Sleep Consult. Pt already on cpa machine and uses it everynight. pt just wants to make sure she is set up right on cpap and pressure is fine.  History of Present Illness: 77 yo female for evaluation of sleep apnea.  She had a sleep test several years ago, and was told she had sleep apnea. She was set up with CPAP, but was unable to tolerate this.  She has been in the hospital several times since Sept. 2011 with knee problems.  She was told that she has trouble with her breathing, and was advised to reconsider using CPAP.  She has since bought her own machine.  She was also noted to have low oxygen level and started on 2 liters with exertion and sleep.  Since starting CPAP she is sleeping better.  She can take deeper breaths, and feels like she has more endurance.  She is using a full face mask.  She is not sure what her CPAP pressure is set at.  She goes to bed at midnight, and falls asleep quickly.  She does not use anything to help sleep.  She wakes up once or twice to use the bathroom.  She gets out of bed at 9am.  She denies morning headache.  She does not snore since using CPAP.  She is not using anything to help her stay awake.  She denies sleep walking, sleep talking, bruxism, or nightmares.  There is no history of restless legs.  She denies sleep hallucinations, sleep paralysis, or cataplexy.  She does not have any trouble with her mood currently.  She denies thyroid disease.  Her weight fluctuates, depending on how much edema she has.  Current Medications (verified): 1)  Cardizem Cd 120 Mg Xr24h-Cap (Diltiazem Hcl Coated Beads) .[name].. 1 Tab By Mouth Daily 2)  Warfarin Sodium 5 Mg  Tabs (Warfarin Sodium) .... Per Instructions: 1/2 Tab Alternating Per The Sheet With 1 Whole Tab. 3)  Metolazone 2.5 Mg Tabs (Metolazone) .[name].. 1 By Mouth  Daily If Needed Only To Take If Furosemide Is Not Working. 4)  Zyrtec Allergy 10 Mg Tabs (Cetirizine Hcl) .[name].. 1 By Mouth Daily As Needed 5)  Torsemide 20 Mg Tabs (Torsemide) .... Take Two Tablets Daily 6)  Flecainide Acetate 100 Mg Tabs (Flecainide Acetate) .... Take One Tablet By Mouth Every 12 Hours 7)  Aspirin 81 Mg Tbec (Aspirin) .... Take One Tablet By Mouth Daily 8)  Klor-Con M20 20 Meq Cr-Tabs (Potassium Chloride Crys Cr) .[name].. 1 Tab By Mouth Daily 9)  Oxygen  2 Liters .... Daily As Needed and Uses At Night With Cpap 10)  Venlafaxine Hcl 75 Mg Tabs (Venlafaxine Hcl) .... One Tablet As Needed 11)  Cpap .... At Bedtime  Allergies (verified): 1)  ! Amoxicillin  Past History:  Past Medical History:  1. Chronic diastolic congestive heart failure with mild cor pulmonale          a. ECHO 6/08:  EF 55-60%, mild LVH,  mildly dilated RV w. mildly dec fx, RVSP 67  2. Chronic atrial fibrillation.  Failed cardioversion in past       --repeat DC-CV on october 5th, 2010 on flecainide  3. History of PE/DVT 2008  4. Obstructive sleep apnea.   5. Shingles with postherpetic neuralgia.   6. Hypertension.   7. Morbid  obesity.   8. GERD  9.Hyperlipidemia 10.h/o meningitis 1950's 11.Gallstones s/p cholecystectomy 12. Nonobstructive CAD by cath 8/10     --LAD 40-50%. with mild PAH  mean 29 with PVR 3.2 [place] 13. Small AAA  Past Surgical History: Appendectomy- 1947 Tonsillectomy- 1946 Hospitalized with meningitis at [facility]- 1960 Cholecystectomy Cardioversion x 2- 01/2007  Family History: Reviewed history from 11/25/2006 and no changes required. Mother died age 78.5 of CHF and depression Father died at age 10 of a cerebral hemorrhage, also had a heart defect from birth. Paternal grandmother died of ovarian cancer. Family history of heart disease and obesity.  Social History: Reviewed history from 11/25/2006 and no changes required. Widowed.  Retired from Teacher, English as a foreign language.  Never  smoked.  Has occasional alcohol use.  Review of Systems       The patient complains of shortness of breath with activity, non-productive cough, chest pain, irregular heartbeats, weight change, itching, anxiety, depression, and hand/feet swelling.  The patient denies shortness of breath at rest, productive cough, coughing up blood, acid heartburn, indigestion, loss of appetite, abdominal pain, difficulty swallowing, sore throat, tooth/dental problems, headaches, nasal congestion/difficulty breathing through nose, sneezing, ear ache, joint stiffness or pain, rash, change in color of mucus, and fever.    Vital Signs:  Patient profile:   77 year old female Height:      64 inches Weight:      296 pounds BMI:     50.99 O2 Sat:      93 % on Room air Temp:     97.6 degrees F oral Pulse rate:   77 / minute BP sitting:   124 / 82  (right arm) Cuff size:   large  Vitals Entered By: Carver Fila (July 03, 2010 2:55 PM)  O2 Flow:  Room air CC: Sleep Consult. Pt already on cpa machine and uses it everynight. pt just wants to make sure she is set up right on cpap and pressure is fine Comments meds and allergies updated Phone number updated Mindy Silva  July 03, 2010 3:00 PM     Physical Exam  General:  normal appearance, healthy appearing, and obese.   Eyes:  PERRLA and EOMI.   Nose:  no deformity, discharge, inflammation, or lesions Mouth:  MP 3, no exudate Neck:  no JVD.   Lungs:  clear bilaterally to auscultation and percussion Heart:  regular rate and rhythm, S1, S2 without murmurs, rubs, gallops, or clicks Abdomen:  bowel sounds positive; abdomen soft and non-tender without masses, or organomegaly Extremities:  2+ edema with chronic venous stasis changes Neurologic:  normal CN II-XII.   Cervical Nodes:  no significant adenopathy Psych:  alert and cooperative; normal mood and affect; normal attention span and concentration   Impression & Recommendations:  Problem # 1:   OBSTRUCTIVE SLEEP APNEA (ICD-327.23) She has a history of sleep apnea.  She has cardiovascular disease.  She has purchased her own CPAP machine.  I reviewed how sleep apnea can affect her health.  Driving precautions were discussed.  Will get a download from her CPAP machine, and arrange for overnight oximetry while using CPAP and 2 liters oxygen.  Problem # 2:  SLEEP RELATED HYPOVENTILATION/HYPOXEMIA CCE (ICD-327.26) She is to continue on supplemental oxygen with CPAP.  Depending on her CPAP download will determine if she needs an in lab titration.  Problem # 3:  OBESITY-MORBID (>100') (ICD-278.01) Explained to her how her weight is affecting her sleep and health.  Medications Added to Medication  List This Visit: 1)  Oxygen 2 Liters  .... Daily as needed and uses at night with cpap 2)  Cpap  .... At bedtime  Complete Medication List: 1)  Cardizem Cd 120 Mg Xr24h-cap (Diltiazem hcl coated beads) .[name].. 1 tab by mouth daily 2)  Warfarin Sodium 5 Mg Tabs (Warfarin sodium) .... Per instructions: 1/2 tab alternating per the sheet with 1 whole tab. 3)  Aspirin 81 Mg Tbec (Aspirin) .... Take one tablet by mouth daily 4)  Torsemide 20 Mg Tabs (Torsemide) .... Take two tablets daily 5)  Klor-con M20 20 Meq Cr-tabs (Potassium chloride crys cr) .... 2 tab by mouth daily 6)  Venlafaxine Hcl 75 Mg Tabs (Venlafaxine hcl) .... One tablet as needed 7)  Zyrtec Allergy 10 Mg Tabs (Cetirizine hcl) .[name].. 1 by mouth daily as needed  Other Orders: Consultation Level IV (16109) DME Referral (DME)  Patient Instructions: 1)  Will get report from CPAP machine 2)  Will arrange for overnight oxygen test while using CPAP and oxygen 3)  Follow up in 3 to 4 months

## 2010-07-24 NOTE — Procedures (Signed)
Summary: Oximetry/[facility]  Oximetry/[facility]   Imported By: Sherian Rein 07/20/2010 14:48:18  _____________________________________________________________________  External Attachment:    Type:   Image     Comment:   External Document

## 2010-07-24 NOTE — Miscellaneous (Signed)
Summary: Waiver of Liability for [employer]  Waiver of Liability for [employer]   Imported By: Maryln Gottron 07/18/2010 12:14:21  _____________________________________________________________________  External Attachment:    Type:   Image     Comment:   External Document

## 2010-07-24 NOTE — Miscellaneous (Signed)
Summary: Overnight oximetry with CPAP and 2 liters oxygen  Clinical Lists Changes Test time 7hrs 26 min.  Mean SpO2 90.9%, low 73%.  Spent 20 sec (4.1%) with SpO2 < 88%.  Will have my nurse call to inform pt that oxygen test looked okay with current set up.  Appended Document: Overnight oximetry with CPAP and 2 liters oxygen called and spoke with pt and she verbalized understanding of the results and had no questions.

## 2010-07-27 ENCOUNTER — Encounter (INDEPENDENT_AMBULATORY_CARE_PROVIDER_SITE_OTHER): Payer: Self-pay | Admitting: *Deleted

## 2010-07-27 ENCOUNTER — Other Ambulatory Visit: Payer: Medicare Other

## 2010-07-27 ENCOUNTER — Other Ambulatory Visit: Payer: Self-pay | Admitting: Family Medicine

## 2010-07-27 DIAGNOSIS — Z1212 Encounter for screening for malignant neoplasm of rectum: Secondary | ICD-10-CM

## 2010-07-30 ENCOUNTER — Encounter (INDEPENDENT_AMBULATORY_CARE_PROVIDER_SITE_OTHER): Payer: Self-pay | Admitting: *Deleted

## 2010-07-30 LAB — FECAL OCCULT BLOOD, IMMUNOCHEMICAL: Fecal Occult Bld: NEGATIVE

## 2010-07-31 NOTE — Miscellaneous (Signed)
Summary: AutoCPAP download 06/09/10 to 07/08/10  Clinical Lists Changes Used on 30 of 30 nights with average 7hr .  Optimal pressure 8 cm H2O with average AHI 1.4.  Will have my nurse inform patient that CPAP report looked good.  No change to current set up needed.  Appended Document: AutoCPAP download 06/09/10 to 07/08/10 pt verbalized understanding of the results

## 2010-08-01 LAB — CARDIAC PANEL(CRET KIN+CKTOT+MB+TROPI)
CK, MB: 0.8 ng/mL (ref 0.3–4.0)
CK, MB: 1 ng/mL (ref 0.3–4.0)
Relative Index: INVALID (ref 0.0–2.5)
Relative Index: INVALID (ref 0.0–2.5)
Relative Index: INVALID (ref 0.0–2.5)
Total CK: 45 U/L (ref 7–177)
Total CK: 50 U/L (ref 7–177)
Troponin I: 0.01 ng/mL (ref 0.00–0.06)

## 2010-08-01 LAB — BASIC METABOLIC PANEL
CO2: 30 mEq/L (ref 19–32)
Calcium: 8.8 mg/dL (ref 8.4–10.5)
Chloride: 99 mEq/L (ref 96–112)
GFR calc Af Amer: >60 mL/min (ref >60)
Glucose, Bld: 105 mg/dL — ABNORMAL HIGH (ref 70–99)
Potassium: 3.1 mEq/L — ABNORMAL LOW (ref 3.5–5.1)
Sodium: 138 mEq/L (ref 135–145)

## 2010-08-01 LAB — POCT I-STAT, CHEM 8
Creatinine, Ser: 1.1 mg/dL (ref 0.4–1.2)
Hemoglobin: 15 g/dL (ref 12.0–15.0)
Potassium: 4.2 mEq/L (ref 3.5–5.1)
Sodium: 138 mEq/L (ref 135–145)

## 2010-08-01 LAB — CBC
HCT: 44.4 % (ref 36.0–46.0)
Hemoglobin: 13.4 g/dL (ref 12.0–15.0)
Hemoglobin: 15.2 g/dL — ABNORMAL HIGH (ref 12.0–15.0)
MCHC: 34.2 g/dL (ref 30.0–36.0)
MCV: 98.5 fL (ref 78.0–100.0)
RBC: 3.96 MIL/uL (ref 3.87–5.11)
RBC: 4.5 MIL/uL (ref 3.87–5.11)
RDW: 14.4 % (ref 11.5–15.5)
WBC: 9.5 10*3/uL (ref 4.0–10.5)

## 2010-08-01 LAB — DIFFERENTIAL
Basophils Absolute: 0.1 10*3/uL (ref 0.0–0.1)
Basophils Relative: 1 % (ref 0–1)
Eosinophils Absolute: 0.2 10*3/uL (ref 0.0–0.7)
Eosinophils Relative: 2 % (ref 0–5)
Monocytes Absolute: 1.1 10*3/uL — ABNORMAL HIGH (ref 0.1–1.0)
Monocytes Relative: 8 % (ref 3–12)
Neutro Abs: 11.2 10*3/uL — ABNORMAL HIGH (ref 1.7–7.7)

## 2010-08-01 LAB — PROTIME-INR
INR: 2.37 — ABNORMAL HIGH (ref 0.00–1.49)
Prothrombin Time: 25.7 seconds — ABNORMAL HIGH (ref 11.6–15.2)
Prothrombin Time: 28.5 seconds — ABNORMAL HIGH (ref 11.6–15.2)

## 2010-08-01 LAB — POCT CARDIAC MARKERS
CKMB, poc: <1 ng/mL — ABNORMAL LOW (ref 1.0–8.0)
Myoglobin, poc: 99.8 ng/mL (ref 12–200)

## 2010-08-01 LAB — APTT: aPTT: 42 seconds — ABNORMAL HIGH (ref 24–37)

## 2010-08-06 ENCOUNTER — Other Ambulatory Visit (INDEPENDENT_AMBULATORY_CARE_PROVIDER_SITE_OTHER): Payer: Medicare Other

## 2010-08-06 DIAGNOSIS — I82409 Acute embolism and thrombosis of unspecified deep veins of unspecified lower extremity: Secondary | ICD-10-CM

## 2010-08-06 DIAGNOSIS — Z7901 Long term (current) use of anticoagulants: Secondary | ICD-10-CM

## 2010-08-06 DIAGNOSIS — Z5181 Encounter for therapeutic drug level monitoring: Secondary | ICD-10-CM

## 2010-08-09 NOTE — Letter (Signed)
Summary: Results Follow up Letter  [place] [facility]  [address] [place], [place] [address]   Phone: [phone]  Fax: [phone]    07/30/2010 MRN: [id number]     [facility] [name] [name] [address] [place], [place]  [address]  [place]    Dear [name] [name],  The following are the results of your recent test(s):  Test         Result    Pap Smear:        Normal _____  Not Normal _____ Comments: ______________________________________________________ Cholesterol: LDL(Bad cholesterol):         Your goal is less than:         HDL (Good cholesterol):       Your goal is more than: Comments:  ______________________________________________________ Mammogram:        Normal _____  Not Normal _____ Comments:  ___________________________________________________________________ Hemoccult:        Normal __x___  Not normal _______ Comments: Repeat in 1 year  _____________________________________________________________________ Other Tests:    We routinely do not discuss normal results over the telephone.  If you desire a copy of the results, or you have any questions about this information we can discuss them at your next office visit.   Sincerely,   Kerby Nora MD

## 2010-08-16 LAB — PROTIME-INR
INR: 1.8 — ABNORMAL HIGH (ref 0.00–1.49)
INR: 1.9 — ABNORMAL HIGH (ref 0.00–1.49)
INR: 2.38 — ABNORMAL HIGH (ref 0.00–1.49)

## 2010-08-16 LAB — DIGOXIN LEVEL: Digoxin Level: 1.4 ng/mL (ref 0.8–2.0)

## 2010-08-16 LAB — COMPREHENSIVE METABOLIC PANEL
AST: 23 U/L (ref 0–37)
Albumin: 3.2 g/dL — ABNORMAL LOW (ref 3.5–5.2)
Alkaline Phosphatase: 86 U/L (ref 39–117)
BUN: 14 mg/dL (ref 6–23)
CO2: 26 mEq/L (ref 19–32)
Chloride: 105 mEq/L (ref 96–112)
Creatinine, Ser: 1.14 mg/dL (ref 0.4–1.2)
GFR calc Af Amer: 56 mL/min — ABNORMAL LOW (ref >60)
GFR calc non Af Amer: 46 mL/min — ABNORMAL LOW (ref >60)
Potassium: 4.9 mEq/L (ref 3.5–5.1)
Total Bilirubin: 0.8 mg/dL (ref 0.3–1.2)

## 2010-08-16 LAB — BASIC METABOLIC PANEL
BUN: 15 mg/dL (ref 6–23)
BUN: 20 mg/dL (ref 6–23)
BUN: 21 mg/dL (ref 6–23)
CO2: 32 mEq/L (ref 19–32)
Chloride: 106 mEq/L (ref 96–112)
Chloride: 92 mEq/L — ABNORMAL LOW (ref 96–112)
Chloride: 93 mEq/L — ABNORMAL LOW (ref 96–112)
Chloride: 95 mEq/L — ABNORMAL LOW (ref 96–112)
Creatinine, Ser: 1.17 mg/dL (ref 0.4–1.2)
GFR calc Af Amer: 48 mL/min — ABNORMAL LOW (ref >60)
GFR calc Af Amer: 53 mL/min — ABNORMAL LOW (ref >60)
GFR calc non Af Amer: 40 mL/min — ABNORMAL LOW (ref >60)
Glucose, Bld: 103 mg/dL — ABNORMAL HIGH (ref 70–99)
Glucose, Bld: 96 mg/dL (ref 70–99)
Glucose, Bld: 96 mg/dL (ref 70–99)
Potassium: 3 mEq/L — ABNORMAL LOW (ref 3.5–5.1)
Potassium: 3.6 mEq/L (ref 3.5–5.1)
Potassium: 4 mEq/L (ref 3.5–5.1)
Potassium: 4.5 mEq/L (ref 3.5–5.1)
Sodium: 136 mEq/L (ref 135–145)
Sodium: 138 mEq/L (ref 135–145)

## 2010-08-16 LAB — CARDIAC PANEL(CRET KIN+CKTOT+MB+TROPI)
CK, MB: 0.9 ng/mL (ref 0.3–4.0)
Troponin I: 0.01 ng/mL (ref 0.00–0.06)

## 2010-08-16 LAB — CBC
HCT: 43.9 % (ref 36.0–46.0)
MCV: 101.8 fL — ABNORMAL HIGH (ref 78.0–100.0)
Platelets: 244 10*3/uL (ref 150–400)
RBC: 4.32 MIL/uL (ref 3.87–5.11)
WBC: 11.5 10*3/uL — ABNORMAL HIGH (ref 4.0–10.5)

## 2010-08-16 LAB — BRAIN NATRIURETIC PEPTIDE: Pro B Natriuretic peptide (BNP): 283 pg/mL — ABNORMAL HIGH (ref 0.0–100.0)

## 2010-08-19 LAB — POCT I-STAT 3, ART BLOOD GAS (G3+)
pCO2 arterial: 40.8 mmHg (ref 35.0–45.0)
pH, Arterial: 7.399 (ref 7.350–7.400)

## 2010-08-19 LAB — POCT I-STAT 3, VENOUS BLOOD GAS (G3P V)
Bicarbonate: 24 mEq/L (ref 20.0–24.0)
Bicarbonate: 26 mEq/L — ABNORMAL HIGH (ref 20.0–24.0)
O2 Saturation: 60 %
TCO2: 25 mmol/L (ref 0–100)
TCO2: 27 mmol/L (ref 0–100)
pCO2, Ven: 43.8 mmHg — ABNORMAL LOW (ref 45.0–50.0)
pH, Ven: 7.37 — ABNORMAL HIGH (ref 7.250–7.300)
pO2, Ven: 33 mmHg (ref 30.0–45.0)

## 2010-08-19 LAB — PROTIME-INR
INR: 1.2 (ref 0.00–1.49)
Prothrombin Time: 15.1 seconds (ref 11.6–15.2)

## 2010-08-20 ENCOUNTER — Telehealth: Payer: Self-pay | Admitting: Radiology

## 2010-08-20 DIAGNOSIS — Z7901 Long term (current) use of anticoagulants: Secondary | ICD-10-CM

## 2010-08-20 DIAGNOSIS — Z5181 Encounter for therapeutic drug level monitoring: Secondary | ICD-10-CM

## 2010-08-20 DIAGNOSIS — I749 Embolism and thrombosis of unspecified artery: Secondary | ICD-10-CM

## 2010-08-20 DIAGNOSIS — I4891 Unspecified atrial fibrillation: Secondary | ICD-10-CM

## 2010-08-20 DIAGNOSIS — T81718A Complication of other artery following a procedure, not elsewhere classified, initial encounter: Secondary | ICD-10-CM

## 2010-08-20 NOTE — Telephone Encounter (Signed)
Order for INR

## 2010-09-05 ENCOUNTER — Telehealth: Payer: Self-pay | Admitting: *Deleted

## 2010-09-05 NOTE — Telephone Encounter (Signed)
Refill request from [facility] for effexor ER, what is listed in centricity, on the med list, is the plain.  Please clarifiy if pt is to have XR or plain.

## 2010-09-05 NOTE — Telephone Encounter (Signed)
As far as I can tell should be the generic.[name] Venlafaxine, but XR. She should take that once a day.  To make sure call pt to make sure she is not taking the short acting just once a day for some reason.

## 2010-09-06 ENCOUNTER — Ambulatory Visit (INDEPENDENT_AMBULATORY_CARE_PROVIDER_SITE_OTHER): Payer: Medicare Other | Admitting: Family Medicine

## 2010-09-06 ENCOUNTER — Other Ambulatory Visit: Payer: Self-pay | Admitting: Family Medicine

## 2010-09-06 DIAGNOSIS — I4891 Unspecified atrial fibrillation: Secondary | ICD-10-CM

## 2010-09-06 DIAGNOSIS — Z5181 Encounter for therapeutic drug level monitoring: Secondary | ICD-10-CM

## 2010-09-06 DIAGNOSIS — Z7901 Long term (current) use of anticoagulants: Secondary | ICD-10-CM

## 2010-09-07 NOTE — Telephone Encounter (Signed)
Spoke with pt who is on her way out of town.  She doesn't know want strength of effexor she takes but she will call back Monday with that information.  She also said she doesn't know why pharmacy would request a refill, she has plenty right now.

## 2010-09-25 NOTE — H&P (Signed)
[name] [name], [name] [name] NO.:  [id number]   MEDICAL RECORD NO.:  [id number]          PATIENT TYPE:  INP   LOCATION:  2901                         FACILITY:  [facility]   PHYSICIAN:  Isidor Holts, M.D.  DATE OF BIRTH:  [birth year]   DATE OF ADMISSION:  11/01/2006  DATE OF DISCHARGE:                              HISTORY & PHYSICAL   PRIMARY CARE PHYSICIAN:  Dr. Lyndal Rainbow, [facility]; [facility], [facility], [place].   PRIMARY CARDIOLOGIST:  Dr. Alan Ripper, [place]   The patient is unassigned to [place].   CHIEF COMPLAINT:  Syncopal episode, UTI.   HISTORY OF PRESENT ILLNESS:  This is a 77 year old female. For past  medical history, see below.  According to the patient, over the past  week or so she has been feeling unwell with cough and sinus congestion.  She eventually saw her primary care physician on October 29, 2006. He  diagnosed a viral syndrome and started her on Mucinex and Coricidin.  However, she still felt unwell, and on October 30, 2006, she actually  vomited after breakfast. Denies abdominal pain. Had a single episode of  diarrhea on October 31, 2006.  She on November 01, 2006, came to [facility] to visit her spouse who was on admission there, after lunch. On  arrival at the hospital, she felt dizzy, was placed in a wheelchair by  her son, went upstairs to see her spouse but continued feeling worse.  The nursing staff checked her vital signs and thought she looked  somewhat pale and sent her down to the emergency department, where she  was diagnosed with UTI and commenced on intravenous fluids, given some  antiemetics for nausea, and eventually was allowed home with a  prescription for Macrodantin..  She felt a lot better, went upstairs  again to see her spouse, and then she and the rest of her family went  home. On arrival at home, she walked about 50 feet, thought she was  about to pass out, became sweaty and felt short of breath, and sat  down  on a bench. Denies any associated chest pain.  Her son called emergency  medical services who arrived and evaluated her, found she had pulse rate  in the 120s to 135.  On suspicion of atrial fibrillation, she was  brought to back to the emergency department.   PAST MEDICAL HISTORY:  1. Shingles, September 2007, affecting the right T10 dermatome,      complicated by postherpetic neuralgia.  2. Hypertension.  3. History of mumps and meningitis approximately 50 years ago.  4. Status post appendectomy in the fourth grade.   MEDICATION HISTORY.:  1. A nerve pill for pain from shingles.  2. Blood pressure medications.   The patient does not know the names or doses of these medications.   ALLERGIES:  No known drug allergies.   REVIEW OF SYSTEMS:  As per HPI and Chief Complaint.  The patient denies  fever or chills.   SOCIAL HISTORY:  The patient is married, has offspring.  Nonsmoker,  drinks alcohol only occasionally,  has a history of drug abuse.   FAMILY HISTORY:  Her mother died at age 37-1/2 years with CHF.  Her  father died at age 78 years status post MI.  He was hypertensive.   PHYSICAL EXAMINATION:  GENERAL:  The patient is alert, oriented, not  short of breath at rest, is not in acute distress, communicative.  VITAL SIGNS: Temperature 97.7, pulse 77 per minute and regular,  respiratory rate 18, blood pressure 91/56 mmHg, pulse oximeter 98% on 5  liters of oxygen.  HEENT: No clinical pallor or jaundice.  No conjunctival injection.  THROAT: Visible mucous membranes appear somewhat dry.  NECK:  Supple.  JVP not seen.  No palpable lymphadenopathy.  No palpable  goiter.  No carotid bruits.  CHEST:  Clinically clear to auscultation.  No wheezes or crackles.  HEART:  Sounds 1 and 2 heard, normal, regular.  No murmurs.  ABDOMEN:  Morbidly obese, soft and nontender.  No palpable organomegaly  or palpable masses.  Normal bowel sounds.  EXTREMITIES:  On lower extremity  examination, the patient's lower  extremities appear somewhat elephantoid and obvious pitting edema.  Peripheral pulses are palpable.   INVESTIGATIONS:  CBC: WBC 13.3, hemoglobin 13.6, hematocrit 39.7,  neutrophils 86%, platelets 178.  Electrolytes:  Sodium 132, potassium  3.6, chloride 100, CO2 22, BUN 25, creatinine 1.2, glucose 156.  Troponin I at point of care 0.15, myoglobin 227. AST 24, ALT 26,  alkaline phosphatase 76.   EKG dated November 01, 2006, shows sinus rhythm, regular, 80 per minute,  normal axis.  No acute ischemic changes.  The patient has a partial  right bundle branch block pattern.  EKG obtained from EMS shows lots of  baseline artifact and tachycardia.   ASSESSMENT AND PLAN:  1. Urinary tract infection.  The patient has filled a prescription      which was given to her in the emergency department but has not had      any time to take the medication.  We shall, therefore, start her on      Rocephin, do urine culture and blood cultures for completeness, and      commence the patient on IV fluids.   1. Dehydration.  As evidenced by high BUN-creatinine ratio. This is      likely secondary to #1 above.  We shall manage with intravenous      fluids as noted above.   1. Syncopal episode.  Likely secondary to #2 above, with orthostasis.      Blood pressure is on low side at 91/56 at present.  This should      respond to intravenous fluids.   1. Query tachyarrhythmia.  This is not substantiated by EKGs.      However, cardiac enzymes are elevated.  The patient has been seen      by cardiologist; i.e., Dr. Samuel Bouche of [place].  We shall place her on      telemetry, cycle cardiac enzymes, do TSH, do 2-D echocardiogram.      Per cardiology recommendation, commence her on intravenous Heparin.   1. History of postherpetic neuralgia.  Utilize p.r.n. analgesics.   Further management will depend on clinical course.      Isidor Holts, M.D. Electronically Signed      CO/MEDQ  D:  11/02/2006  T:  11/02/2006  Job:  [id number]

## 2010-09-25 NOTE — Discharge Summary (Signed)
[name], [name]                ACCOUNT NO.:  [id number]   MEDICAL RECORD NO.:  [id number]          PATIENT TYPE:  INP   LOCATION:  3715                         FACILITY:  [facility]   PHYSICIAN:  [name] I Elsaid, MD      DATE OF BIRTH:  1931-11-26   DATE OF ADMISSION:  11/01/2006  DATE OF DISCHARGE:  11/11/2006                               DISCHARGE SUMMARY   PRIMARY PHYSICIANS:  Primary care physician, Dr. Lyndal Rainbow at [facility]; [facility],  [place], [place].  Primary  cardiologist Dr. Alan Ripper, [place].  The patient is unassigned to [place].   DISCHARGE DIAGNOSES:  1. Acute deep vein thrombosis of the popliteal vein extending into the      calf vein.  2. Acute pulmonary embolism.  3. New onset atrial fibrillation with rapid ventricular rate, rate      under control.  4. Hypokalemia, secondary to pulmonary embolism and possibility of      underlying pulmonary hypertension.  5. Hypoxemia, secondary to pulmonary embolism and possibility of      underlying pulmonary hypertension.  6. Diastolic heart failure with ejection fraction of 55 to 65%.  7. A history of shingles on the right.  8. Dermatome, complicated with postherpetic neuralgia.  9. Hypertension.   DISCHARGE MEDICATIONS:  1. Lipitor 40 mg p.o. q.h.s.  2. Digoxin 0.25 p.o. daily.  3. Cardizem 240 mg p.o. daily.  4. Lasix 40 mg p.o. in the morning and 20 mg at night.  5. Lopressor 25 mg p.o. b.i.d.  6. Protonix 40 mg p.o. daily.  7. Potassium chloride 20 mEq p.o. daily.  8. Coumadin as per pharmacy.  9. Acute renal failure.   PROCEDURE:  1. Chest x-ray on June 23rd:  Bronchitic changes, cardiomegaly with      pulmonary vascular congestion.  2. VQ scan on June 23rd was high probability for pulmonary embolus.  3. Chest x-ray on June 22nd:  Cardiomegaly with chronic bronchial      thickening and prominent central vascular marking with pulmonary      hypertension.  4. CT chest without contrast done on  June 29th:  Patchy opacifications      upper lung, concern for infectious inflammatory process, mild      adenopathy.  5. 2D echo done on June 23rd:  Left ventricle was normal, ejection      fraction 55 to 65%.  Study was in adequate for evaluation in the      ventricular region and wall motion abnormality.  Left ventricular      wall thickening was mildly increased and the right ventricle was      mildly dilated.  The right ventricular systolic function was mildly      reduced.  Estimated peak systolic blood  pressure was moderately to      markedly increased.  The right atrium was mildly dilated.   CONSULTING PHYSICIAN:  Cardiology consulted for the shortness of breath  and chest pain, and pulmonary consulted for continuous desaturation.  If  you can review the history done on November 01, 2006, by  Dr. Isidor Holts.   HOSPITAL COURSE:  Problem #1 - Syncopal episode, rule out causes for:  Patient was admitted with syncopal episode with chest pain.  At that  time, no palpitations.  Patient was admitted to telemetry, serial  cardiac enzymes and echo was done.  There was high probability of a  pulmonary embolism.  VQ scan was done which showed PE.  Patient was  started on heparin drip.  Cardiology was seen on the case.  They think  the episode was maybe secondary to pulmonary embolism.  Patient was  during hospitalization started on heparin and Coumadin.  Problem #2 - Patient developed new onset AFib with RVR.  Cardiology was  on the case.  The patient was started on Cardizem drip which was  titrated to Cardizem and digoxin.  Patient has _episode of asystole for  which cardiazem was decreased to240 mg po daily .  Problem #3 -  Hypoxemia:  Patient continued to desaturate overall with  maximum diuresis.  CT scan does not show any underlying pathology,  pulmonaryconsulted where they think the hypoxemia secondarymost likely  related to pulmonary embolism and decreased perfusion.  Will  continue  oxygen and will titrate oxygen to keep above 92% on room air and will  discharge the patient on home oxygen.  Problem #4 - Obstructive sleep apnea:  Patient continues to be oncpap.  Problem #5 - Hypertension:  Has been under control.   DISPOSITION:  The patient to be discharged home with Coumadin and home  oxygen and will haveher  INR chech as outpatient.  During  hospitalization will try further adjustment beta blocker and calcium  channel blocker.      [name] Bosie Helper, MD  Electronically Signed     HIE/MEDQ  D:  11/11/2006  T:  11/12/2006  Job:  [id number]

## 2010-09-25 NOTE — Assessment & Plan Note (Signed)
[facility] OFFICE NOTE   [name], [name]                         MRN:          [id number]  DATE:12/22/2006                            DOB:          10/10/1931    PRIMARY CARE PHYSICIAN:  Dr. Tillman Abide.   REASON FOR PRESENTATION:  Evaluate patient with atrial fibrillation and  dyspnea.   HISTORY OF PRESENT ILLNESS:  Patient returns for followup of the above.  She has been having her Coumadin followed by Dr. Alphonsus Sias.  She has had  some subtherapeutic values.  She has had no new shortness of breath  though she is chronically dyspneic.  She wears home oxygen at night.  She does not wear it when she is out and about.  She has had fairly  stable weights.  She has had fairly stable lower extremity swelling.  She has had no new chest discomfort, neck or arm discomfort.  She has  had no new palpitations but occasionally does feel her heart beating  quickly.  She has had no presyncope or syncope.  She has been weighing  herself daily and taking her blood pressures routinely.  Her heart rate  seems to be under better control with rates in the 90s to 100 range.   PAST MEDICAL HISTORY:  1. Atrial fibrillation.  2. Pulmonary embolism and deep venous thrombosis.  3. Diastolic heart failure.  4. Sleep apnea.  5. Shingles with postherpetic neuralgia.  6. Hypertension.  7. Morbid obesity.  8. Meningitis.  9. Dyslipidemia.  10.Cholecystectomy.  11.Tonsillectomy.   ALLERGIES:  NONE.   MEDICATIONS:  1. Lipitor 40 mg nightly.  2. Cardizem CD 240 mg daily.  3. Lasix 40 mg b.i.d.  4. Protonix 40 mg daily.  5. Potassium 20 mEq daily.  6. Coumadin.  7. Lanoxin 0.25 mg daily.  8. Metoprolol 25 mg q.i.d.   REVIEW OF SYSTEMS:  As stated in the HPI and otherwise negative for  other systems except for some dysuria.   PHYSICAL EXAMINATION:  The patient is in no acute distress.  Blood  pressure 124/78, heart rate 68  and irregular, weight 274 pounds.  HEENT:  Eyelids unremarkable, pupils equally round and reactive to  light, fundi not visualized, oral mucosa unremarkable.  NECK:  No jugular venous distention at 45 degrees, carotid upstroke  brisk and symmetrical, no bruits, no thyromegaly.  LYMPHATICS:  No cervical, axillary or inguinal adenopathy.  LUNGS:  Clear to auscultation bilaterally.  BACK:  No costovertebral angle tenderness.  CHEST:  Unremarkable.  HEART:  PMI not displaced or sustained, S1 and S2 within normal limits,  no S3, no murmurs.  ABDOMEN:  Morbidly obese, positive bowel sounds normal in frequency and  pitch, no bruits, no rebound, no guarding, no midline pulsatile mass, no  hepatomegaly, splenomegaly.  SKIN:  No rashes, no nodules.  EXTREMITIES:  With 2+ pulses throughout, no edema, no cyanosis, no  clubbing.  NEURO:  Oriented to person, place, and time; cranial nerves II-XII  grossly intact, motor grossly intact.   ASSESSMENT/PLAN:  1. Dyspnea.  I  do not think the patient's dyspnea is necessarily      related to volume overload at this point.  I think there could be a      small component however related to her atrial fibrillation.  I      think it is reasonable to try to convert her to normal sinus rhythm      after she has been on therapeutic INR.  She is having this followed      weekly by Dr. Alphonsus Sias and we will get these results.  I will arrange      elective cardioversion when these have been therapeutic.  For now      she seems to have reasonable rate control and will continue with      the medicines as listed.  I will consolidate her to Toprol XL.  2. Diastolic heart failure.  Patient seems to be euvolemic at this      point.  Will make no change to her medical regimen.  She will      continue her daily weights and salt and fluid restriction.  She      understands p.r.n. dosing of diuretics based on her weights.  3. Pulmonary embolism/deep venous thrombosis.  The  patient will remain      on Coumadin indefinitely for this and the atrial fibrillation.  4. Followup.  Will see her back in about 6 weeks but will plan the      cardioversion before then based on the INRs.     Rollene Rotunda, MD, [facility]  Electronically Signed    JH/MedQ  DD: 12/22/2006  DT: 12/23/2006  Job #: [id number]   cc:   Karie Schwalbe, MD

## 2010-09-25 NOTE — Consult Note (Signed)
NAMEANANYA, MCCLEESE                ACCOUNT NO.:  [id number]   MEDICAL RECORD NO.:  [id number]          PATIENT TYPE:  INP   LOCATION:  1829                         FACILITY:  [facility]   PHYSICIAN:  Rod Holler, MD     DATE OF BIRTH:  [birth year]   DATE OF CONSULTATION:  11/01/2006  DATE OF DISCHARGE:                                 CONSULTATION   REFERRING PHYSICIAN:  Isidor Holts, M.D.   CHIEF COMPLAINT:  Passed out.   HISTORY OF PRESENT ILLNESS:  Mrs. Rizo is a 77 year old female who  presented to the emergency department for an episode of syncope.  She  saw her husband earlier today in the hospital.  He is in the hospital  status post back surgery.  Then had an episode of generally not feeling  well.  She felt flushed, felt lightheaded, and had nausea and one  episode of emesis.  She was seen in the emergency department and was  diagnosed with a urinary tract infection and was given an antibiotic.  She had not taken to antibiotic yet, and tonight while walking into a  restaurant, she had another episode of feeling flushed.  She also felt  lightheaded and sat down and had loss of consciousness.  She had no  preceding chest pain, no preceding palpitations.  She had loss of  consciousness for a couple of seconds and then was then awake and alert.  At this time she again had no episodes of palpitations, no chest pain.  EMS was notified, and upon arrival did an EKG which was of poor quality,  was read out as atrial fibrillation, had a heart rate in the 150s, was  narrow and was fairly regular.  Of note, she had an episode of a fall  with no loss of consciousness earlier this year.  Was seen by a  cardiologist and had a negative nuclear stress test at that time.  At  current, she denies any chest pain or shortness of breath.   PAST MEDICAL HISTORY:  Hypertension.   MEDICATIONS:  Cozaar, hypertension medicines, anxiety medicine.   ALLERGIES:  No known drug allergies.   SOCIAL  HISTORY:  The patient is a nonsmoker and lives with her husband.   FAMILY HISTORY:  Father died of a CVA.  Mother died with CHF.   REVIEW OF SYSTEMS:  All systems reviewed in detail and are negative  except as noted in the history of present illness.   PHYSICAL EXAMINATION:  VITAL SIGNS:  Temperature 97.7, oxygen saturation  98%, respiratory rate 18, heart rate 77, blood pressure 91/56.  GENERAL:  Obese female, alert and oriented x3.  In no apparent distress.  HEENT:  Atraumatic, normocephalic.  Pupils are equal, round and reactive  to light.  Extraocular movements intact.  NECK:  No adenopathy.  No JVD. No carotid bruits.  CHEST:  Lungs clear to auscultation bilaterally.  Equal bilateral breath  sounds.  HEART:  Regular rhythm, normal rate.  Normal S1 and S2.  No murmurs,  rubs or gallops  1+ peripheral pulses.  ABDOMEN:  Soft, nontender,  nondistended.  Active bowel sounds.  EXTREMITIES:  No clubbing or cyanosis.  No pitting edema.  NEUROLOGIC:  No focal deficits.   LABORATORY DATA:  CK-MB 2.9, troponin 0.1, myoglobin 228. Hematocrit 43.  Sodium 132, potassium 3.6, chloride 100, bicarb 22, BUN 25, creatinine  1.2, glucose 156.  EKG shows normal sinus rhythm with incomplete right  bundle branch block, nonspecific T wave changes.   IMPRESSION:  Mrs. Rockford is a 77 year old female with history of  hypertension with an episode of presyncope earlier today, tonight with  an episode of syncope.  She had no preceding chest pain or palpitations.  EMS strip was read out as atrial fibrillation but was of poor quality,  was fairly regular with heart rates in the 150s.  I doubt this was  actually atrial fibrillation, but I am unsure exactly what the rhythm  is.  It could represent either a sinus tachycardia or an atrial  tachycardia.   In the emergency department, the patient also had an component of 0.15  with a negative CK-MB.   RECOMMENDATIONS:  1. Monitor heart rhythm on telemetry.   2. Serial cardiac enzymes.  3. Transthoracic echocardiography.  4. Daily aspirin, statin and check lipid panel.  5. Beta blocker, ACE inhibitor if blood pressure tolerates.  6. Heparin bolus and drip for now.  7. Keep potassium 4 to 5, magnesium level greater than 2.  8. Thyroid function tests.  9. Will continue to follow the patient.      Rod Holler, MD  Electronically Signed     TRK/MEDQ  D:  11/02/2006  T:  11/02/2006  Job:  [id number]

## 2010-09-25 NOTE — Assessment & Plan Note (Signed)
[facility] OFFICE NOTE   [name] [name], [name] [name]                         MRN:          [id number]  DATE:08/27/2007                            DOB:          08-Jun-1931    PRIMARY:  Dr. Alphonsus Sias.   REASON FOR PRESENTATION:  Evaluate the patient with atrial fibrillation.   HISTORY OF PRESENT ILLNESS:  The patient is 77 years old.  She presents  for followup of the above.  She has failed cardioversion and has now  been maintained with rate control and anticoagulation.  She does not  notice palpitations.  She has had her depression treated and feels much  better.  She has lost 16 pounds and is extremely excited about this.  She has much less lower extremity swelling and currently denies any  dyspnea.  She says she is breathing better.  She is not having any PND  or orthopnea.  She had no palpitations, presyncope or syncope.   PAST MEDICAL HISTORY:  Atrial fibrillation (recurrent status post  cardioversion), pulmonary embolism and deep venous thrombosis, diastolic  heart failure, sleep apnea, shingles, postherpetic neuralgia,  hypertension, morbid obese, meningitis, dyslipidemia, cholecystectomy,  tonsillectomy.   ALLERGIES:  None.   MEDICATIONS:  Lipitor 40 mg nightly, Cardizem 240 mg daily, Lasix 40 mg  b.i.d., potassium, Coumadin as directed, Lanoxin 0.25 mg daily,  metoprolol 100 mg daily, Wellbutrin 50 mg b.i.d.   REVIEW OF SYSTEMS:  As stated in HPI, otherwise negative for other  systems.   PHYSICAL EXAMINATION:  The patient is in no distress.  Blood pressure 126/84, heart 64 and regular, weight 279 pounds, body  mass index 45.  NECK:  No jugular venous distention at 45 degrees.  Carotid upstroke  brisk and symmetrical.  No bruits, thyromegaly.  LYMPHATICS:  No lymphadenopathy.  LUNGS:  Clear to auscultation bilaterally.  HEART:  PMI not displaced or sustained, S1-S2 within normal.  No S3, no  murmurs.  ABDOMEN:  Morbidly obese, positive bowel sounds normal in frequency and  pitch no bruits, rebound, guarding or midline pulsatile mass,  organomegaly.  (The exam is compromised by her weight).  EXTREMITIES:  Much less lower extremity edema, chronic venous stasis  changes, 2+ pulses throughout.  NEURO:  Grossly intact.   EKG atrial fibrillation with controlled ventricular rate.  Leftward  axis, poor anterior R-wave progression, no acute ST wave changes.   ASSESSMENT/PLAN:  1. Atrial fibrillation.  The patient is tolerating Coumadin.  She did      wear a Holter monitor.  There was some rapid rate particularly in      the morning hours and a few 2-second pauses.  However, she denies      any symptoms related to this.  I think overall this is reasonable      rate control and we should continue the medications as listed.  2. Diastolic heart failure.  The patient is losing weight and may over      time be able to tolerate a little less diuretic.  Encouraged more  weight loss, continued salt and fluid restriction.  She will remain      for now on the medicines as listed.  3. Obesity.  I applaud her weight loss.  Encouraged more the same.  4. Followup.  Will see her back in 6 months and probably yearly      thereafter.     Rollene Rotunda, MD, [facility]  Electronically Signed    JH/MedQ  DD: 08/27/2007  DT: 08/27/2007  Job #: [id number]   cc:   Karie Schwalbe, MD

## 2010-09-25 NOTE — Assessment & Plan Note (Signed)
[facility] OFFICE NOTE   [name] [name], [name] [name]                         MRN:          [id number]  DATE:11/27/2006                            DOB:          08-17-1931    PRIMARY CARE PHYSICIAN:  Karie Schwalbe, M.D.   REASON FOR PRESENTATION:  This is a patient with atrial fibrillation and  dyspnea.   HISTORY OF THE PRESENT ILLNESS:  The patient is a very pleasant 77-year-  old white female whom I met in the hospital in late June.  She had a  syncopal episode.  She subsequently was found to have acute deep venous  thrombosis and acute pulmonary embolism.  She also had atrial  fibrillation with a right ventricular rate.  This was on top of her  chronic history of diastolic heart failure, chronic lung disease and  morbid obesity.  The patient was adequately anticoagulated.  She had  rate control of her atrial fibrillation.  She did have a small cardiac  enzyme bump, but it was consistent with her pulmonary embolism.  She had  no symptoms consistent otherwise with a myocardial infarction and she  did not have an ischemia workup.  She was seen by the pulmonologist  because she was quite hypoxemic.  It turned out this has been a  longstanding issue.  She was sent home on oxygen.  It was felt that she  had multifactorial respiratory failure.  At the time of discharge she  was feeling better, but still in my mind tenuous.  She was on continuous  O2 and had reasonable rate control.   Since going home she has had a slow deterioration.  She has been more  fatigued; part of this may be related to coming off chronic steroids to  a steroid taper that she was taking.  She is ambulating in the house.  She has had a slow weight gain of about a pound a day.  She just started  keeping her feet elevated as she was not doing this previously.  She has  known her heart rate to be elevated when she takes her blood pressure at  home.   She is slightly more short of breath, though she is not  describing PND or orthopnea.   Yesterday she developed sharp chest discomfort.  This persisted from the  time she woke up until later in the afternoon.  It hurt with a deep  breath.  It went away spontaneously.  She still notices it a little bit  today when I had her take a deep breath with the exam.  She has not had  any presyncopal or syncopal episodes.   PAST MEDICAL HISTORY:  1. New onset atrial fibrillation as described.  2. Sleep apnea (she is undergoing evaluation for CPAP).  3. Pulmonary embolism and deep venous thrombosis in June 2008.  4. Diastolic heart failure.  5. Shingles with postherpetic neuralgia.  6. Hypertension.  7. Morbid obesity.  8. Meningitis.  9. Dyslipidemia.  10.Cholecystectomy.  11.Tonsillectomy.   ALLERGIES:  None.   MEDICATIONS:  1. Lipitor 400 mg at bedtime.  2. Cardizem 240 mg daily.  3. Lasix 40 mg twice a day.  4. Metoprolol 25 mg twice a day.  5. Protonix 40 mg daily.  6. Potassium 20 mEq daily.  7. Coumadin per [facility].   REVIEW OF SYSTEMS:  The review of systems is as stated in the HPI and is  otherwise negative for other systems.   PHYSICAL EXAMINATION:  GENERAL APPEARANCE:  The patient is in no acute  distress.  VITAL SIGNS:  Blood pressure 100/60, and heart rate 110-140 and  irregular.  HEENT:  Eyes and unremarkable.  Pupils are equal and react to light.  Fundi not visualized.  Oral mucosa is unremarkable.  NECK:  The patient is examined seated she cannot get up on the examining  table.  There is no jugular venous distention at 90 degrees.  Carotid  upstroke is risk and symmetrical with no bruits.  No thyromegaly.  LYMPHATICS:  No cervical, axillary or inguinal adenopathy.  LUNGS:  The lungs have decreased breath sounds, but no crackles or  wheezing.  BACK:  The back has no costovertebral angle tenderness.  CHEST:  The chest is unremarkable.  HEART:  I cannot  appreciate her PMI.  She has distant heart sounds.  S1  and S2 are within normal limits.  No S3.  There is no rub.  ABDOMEN:  The patient's abdomen is obese compromising the exam.  She has  no rebound or guarding.  She has positive bowel sounds.  I cannot  appreciate any organomegaly or midline pulsatile mass.  SKIN:  The skin has no rashes.  Chronic venous stasis changes.  EXTREMITIES:  The extremities have 2+ pulses.  Mild bilateral lower  extremity edema with also probably some lymphedema and chronic venous  stasis changes.  NEUROLOGIC EXAMINATION:  Neurologically she is oriented to person, place  and time.  Cranial nerves are grossly intact.  Motor is grossly intact.   LABORATORY DATA:  Electrocardiogram showed atrial fibrillation with a  rapid ventricular rate.   ASSESSMENT AND PLAN:  1. Fatigue.  This is probably multifactorial and her biggest      complaint.  She is coming off the steroids.  She has multiple      problems.  She is not in acute distress.  At this point I will      treat this as I am treating the issues below.  2. Atrial fibrillation.  She was sent home on digoxin, but has not      been taking this.  I will renew this.  I am also increasing her      metoprolol to 25 mg four times a day this should avoid any      hypotensive episodes.  Ultimately I may need to try cardioversion.      She is on therapeutic Coumadin.  I would very much like her to be      on this for four weeks before trying the cardioversion.  I think      that transesophageal echocardiogram would be problematic I this      obese woman with sleep apnea and other lung disease.  I have      discussed this with her family.  If she has any presyncope or      syncope, or symptoms related to tachy palpitations she needs to      simply present to the emergency room.  3. Chest discomfort.  Today she describes  a pleuritic discomfort.  She      may need a stress perfusion study in the future; however, there  is      no evidence of ischemia on her electrocardiogram, though she has      some subtle T wave changes; again, the pain is with deep breathing.      She is on therapeutic Coumadin, so I would not suspect a new      pulmonary emboli; it has gone away.  She is to present to the      emergency room if it increases.  4. Dyspnea.  I am going to see if slowing her heart rate down improves      her dyspnea.  At this point she is on Lasix, which is new to her,      and I would like to simply continue this dose.  I did instruct her      on keeping her feet elevated.  We have had long discussions about      salt restrictions.  She will remain on the continuous oxygen.  If      she gets worse rather than better she needs to come to the      emergency room.  5. Obesity.  We will need to be tackling this aggressively long term      as this is the root of many evils.  6. Deep venous thrombosis.  She is managed with Coumadin per [facility].   FOLLOW UP:  I will see her back in one week.  Again, she is encouraged  with any deterioration or change in her status to simple present to the  emergency room.     Rollene Rotunda, MD, [facility]  Electronically Signed    JH/MedQ  DD: 11/27/2006  DT: 11/28/2006  Job #: [id number]   cc:   Concha Pyo, NP

## 2010-09-25 NOTE — Op Note (Signed)
[name] [name], [name] [name]                ACCOUNT NO.:  [id number]   MEDICAL RECORD NO.:  [id number]          PATIENT TYPE:  OIB   LOCATION:  2864                         FACILITY:  [facility]   PHYSICIAN:  Rollene Rotunda, MD, FACCDATE OF BIRTH:  [birth year]   DATE OF PROCEDURE:  02/04/2007  DATE OF DISCHARGE:  02/04/2007                               OPERATIVE REPORT   PROCEDURE:  Direct current cardioversion.   INDICATIONS:  Symptomatic atrial fibrillation.   PROCEDURE NOTE:  The patient signed informed consent.  She had  therapeutic Coumadin levels documented four weeks in a row.  Her  Coumadin level today was 2.00.  Anesthesia was provided per the  anesthesia service with Pentothal 125 mg.  She had her airway maintained  with bag ventilation and she was hemodynamically monitored.  She was  cardioverted times one with 150 joules of biphasic energy converting  easily to normal sinus rhythm.  There are no apparent complications.  EKG is pending.   The patient will remain on Coumadin.  She will get an extra 2.5 mg  tonight.  We will coordinate with the Coumadin clinic in [facility]: Lambert Rhonda W,  who follows her for Coumadin check in the next few days.  She is  scheduled to see me October 10 at 11:00 a.m. in our [place] office.      Rollene Rotunda, MD, [facility]  Electronically Signed     JH/MEDQ  D:  02/04/2007  T:  02/05/2007  Job:  [id number]   cc:   Karie Schwalbe, MD

## 2010-09-25 NOTE — Discharge Summary (Signed)
[name] [name], [name] [name]                ACCOUNT NO.:  [id number]   MEDICAL RECORD NO.:  [id number]          PATIENT TYPE:  INP   LOCATION:  3715                         FACILITY:  [facility]   PHYSICIAN:  Madaline Savage, MD        DATE OF BIRTH:  05/02/1932   DATE OF ADMISSION:  11/01/2006  DATE OF DISCHARGE:  11/13/2006                               DISCHARGE SUMMARY   ADDENDUM   Addendum to discharge summary dictated by Dr. Eda Paschal on 11/11/2006.   [name] has been doing pretty well.  Has dyspnea has improved.  She  is still hypoxic and when she was taken off the oxygen she desaturated  to 80% while she was active.  So we have arranged from home oxygen for  her.  She will follow up with the cardiologist she saw in the hospital  and she says she wants to change her primary care doctor and she will be  planning to follow up with one of the [place] primary care doctors on  discharge.  At this time she feels close to her baseline.  Her INR was  elevated at 4.5 and her Coumadin was held yesterday.  Today it has come  down to 3.4 and she will be given a small dose of Coumadin.  I will put  her on 5 mg of Coumadin for the next 3 days and she will have her INR  checked on Monday at the Coumadin clinic.  She will have a followup  appointment with the cardiologist in one week.  She was also put on  steroids while she was in the hospital, which we will taper off in the  next few days.   DISCHARGE MEDICATIONS:  1. Prednisone 30 mg once daily for 2 days and then 20 mg once daily      for 2 days and then 10 mg once daily for 2 days, and stop.  2. Coumadin 5 mg once daily.  3. Lipitor 40 mg daily.  4. Cardizem 240 mg once daily.  5. Lasix 40 mg in the morning and 20 mg at night.  6. Lopressor 25 mg twice daily.  7. Protonix 40 mg daily.  8. Potassium chloride 20 mEq daily.   DISPOSITION:  She is now being discharged home in a stable condition.  We will be arranging for home oxygen at 2 liters for  her.   FOLLOW UP:  She will follow up with the [facility] cardiology on 11/25/2006  at 2:15 p.m.  She also has a followup appointment at the Coumadin clinic  with [place] on 11/27/2006 at 3:45 p.m.      Madaline Savage, MD  Electronically Signed     PKN/MEDQ  D:  11/13/2006  T:  11/13/2006  Job:  [id number]

## 2010-09-25 NOTE — Cardiovascular Report (Signed)
NAMEFARRAH, SKODA                ACCOUNT NO.:  [id number]   MEDICAL RECORD NO.:  [id number]          PATIENT TYPE:  OIB   LOCATION:  1961                         FACILITY:  [facility]   PHYSICIAN:  Bevelyn Buckles. Bensimhon, MDDATE OF BIRTH:  November 12, 1931   DATE OF PROCEDURE:  12/05/2008  DATE OF DISCHARGE:  12/05/2008                            CARDIAC CATHETERIZATION   PRIMARY CARE PHYSICIAN:  Kerby Nora, MD   INDICATIONS:  Chest pain and pulmonary hypertension.   PROCEDURES PERFORMED:  1. Right heart catheterization.  2. Selective coronary angiography.  3. Left heart catheterization.  4. Left ventriculogram.  5. Abdominal aortogram.   DESCRIPTION OF PROCEDURE:  The risks and indication of the  catheterization were explained, consent was signed and placed on the  chart.  A 4-French arterial sheath was placed in the right femoral  artery using a modified Seldinger technique.  Standard catheters  including JL-4, 3DRC and angled pigtail were used for procedure.  We did  have some trouble advancing the wire through the abdominal aorta as I  continually wanted to go up the right iliac and down the left.  We  subsequently did all catheter exchanges over a high-wire approach.   A 7-French venous sheath was placed in the right femoral vein and a  standard Swan-Ganz catheter was used for the right heart cath.  There  was no apparent complications.   HEMODYNAMICS:  1. Right atrial pressure mean of 8, RV pressure 39/1 with an EDP of 8.      PA pressure 40/90 with a mean of 29.  Pulmonary capillary wedge      pressure was mean of 14 with occasional V-waves to 25 suggestive of      mitral regurgitation.  2. Fick cardiac output was 4.7 liters per minute.  Cardiac index 2.0      liters per minute per meter squared.  Pulmonary vascular resistance      was 3.2 Woods units.   Left main was normal.   LAD was a long vessel wrapping the apex.  It gave off a diagonal branch  of the midsection.  There  was a 30% lesion in the proximal portion of  the LAD followed by a 40-50% lesion at the takeoff of the large diagonal  branch.  The diagonal branch was a 40% stenosis.   Left circumflex was a large codominant vessel gave off 2 marginal  branches, posterolateral and the PDA, it was angiographically normal.   Right coronary was a small codominant vessel gave off a PDA and  posterolateral, it was angiographically normal.   Left ventriculogram done in the RAO position showed an EF of 65% with no  regional wall motion abnormalities.  Abdominal aortogram showed patent  renal arteries bilaterally.  There was no obstruction in abdominal aorta  despite our difficulty manipulating the wire.  She did have a very small  infrarenal abdominal aortic aneurysm.   ASSESSMENT:  1. Mild nonobstructive coronary artery disease in the left anterior      descending as described above.  2. Normal left ventricular function.  3. Mild pulmonary  arterial hypertension.  4. Small abdominal aortic aneurysm.   PLAN/DISCUSSION:  At this point, we will continue to treat her  medically.  Her atrial fibrillation has been quite symptomatic.  Given  her lack of coronary artery disease in normal structural heart, I think  we can try flecainide as a first line agent.  I will discuss this with  her and follow.      Bevelyn Buckles. Bensimhon, MD  Electronically Signed     DRB/MEDQ  D:  12/05/2008  T:  12/05/2008  Job:  [id number]   cc:   Kerby Nora, MD

## 2010-09-25 NOTE — Assessment & Plan Note (Signed)
[facility] OFFICE NOTE   HOLDEN, DRAUGHON                         MRN:          [id number]  DATE:06/01/2007                            DOB:          1931/08/14    PRIMARY CARE PHYSICIAN:  Karie Schwalbe, M.D.   REASON FOR VISIT:  Evaluate patient with atrial fibrillation and  dyspnea.   HISTORY OF PRESENT ILLNESS:  The patient is 77 years old.  She returns  for follow-up of the above.  She was in sinus rhythm following DC  cardioversion.  However, she is now back in atrial fibrillation by EKG.  She says she may have noticed her heart starting to race about a month  ago.  However, it does not seem to have made big difference in her  functional level.  She still has less dyspnea and more energy than she  did prior to her hospitalization last year.  She has not noticed a big  drop off from the improvement that she has had.  However, she is clearly  depressed and she admits to this.  She has been eating a lot and has  gained back some of the weight that she lost.  She is not being active  and sits around reading.  She is not having any PND or orthopnea.  She  is not having any presyncope or syncope.  Although she may feel her  heart go a little bit fast, she is not bothered by any significant  tachypalpitations.  She is not having any chest discomfort, neck or arm  discomfort.   PAST MEDICAL HISTORY:  Atrial fibrillation (recurrent status post DC  cardioversion), pulmonary embolism, and deep venous thrombosis,  diastolic heart failure, sleep apnea (apparently mild), shingles,  postherpetic neuralgia, hypertension, morbid obesity, meningitis,  dyslipidemia, cholecystectomy, tonsillectomy.   ALLERGIES:  No known drug allergies.   MEDICATIONS:  1. Lipitor 40 mg q.h.s.  2. Cardizem 240 mg daily.  3. Lasix 40 mg b.i.d.  4. Potassium 20 mEq daily.  5. Coumadin per Dr. Karle Starch office.  6. Lanoxin 0.25 mg  daily.  7. Metoprolol 100 mg daily.   REVIEW OF SYSTEMS:  As stated in the HPI and otherwise negative for  other systems.   PHYSICAL EXAMINATION:  GENERAL:  The patient is in no distress.  VITAL SIGNS:  Blood pressure 130/70, heart rate 86 and irregular, weight  289 pounds (up 15 pounds).  HEENT:  Eye lids unremarkable, pupils equal, round, and reactive to  light.  Fundi not visualized.  Oral mucosa unremarkable.  NECK:  No jugular venous distention to 45 degrees.  Carotid upstroke  brisk and symmetric.  No bruits.  No thyromegaly.  LYMPH:  No cervical, axillary, or inguinal adenopathy.  LUNGS:  Clear to auscultation bilaterally.  BACK:  No costovertebral angle tenderness.  CHEST:  Unremarkable.  HEART:  PMI not displaced or sustained.  S1 and S2 within normal limits.  No S3, no murmurs.  ABDOMEN:  Morbidly obese, positive bowel sounds normal in frequency and  pitch.  No bruits, no rebound,  no guarding, no midline pulsatile mass,  no hepatosplenomegaly.  SKIN:  No rashes, no nodules.  EXTREMITIES:  Chronic nonpitting lower extremity edema with chronic  venous stasis changes, 2+ pulses throughout.  No cyanosis or clubbing.  NEUROLOGY:  Oriented to person, place, and time.  Cranial nerves II-XII  grossly intact.  Motor grossly intact.   EKG; atrial fibrillation, axis within normal limits, intervals within  normal limits.  No acute ST-T wave changes.   ASSESSMENT:  1. Atrial fibrillation.  The patient is back in atrial fibrillation.      At this point I do not think it is making a big clinical      difference.  She is on Coumadin.  I am going to apply a 24-hour      Holter monitor to make sure she has adequate rate control.  I would      pursue rate control and anticoagulation unless there is more      convincing evidence that rhythm management will make a difference      symptomatically.  2. Depression.  I think this is a significant problem for the patient.      She admits to  this.  I have sent an email to Dr. Alphonsus Sias asking if      his office with address and manage this.  3. Morbid obesity.  I think this is tied to the depression.  She is      encouraged to lose weight with diet and exercise.  4. Diastolic heart failure.  The patient seems to be euvolemic at this      point.  She will continue with salt and fluid restriction and blood      pressure management.  5. Hypertension.  Blood pressure is well controlled and she will      continue her medications as listed.  6. Follow-up.  I will see her back in about three months or sooner if      needed.     Rollene Rotunda, MD, [facility]  Electronically Signed    JH/MedQ  DD: 06/01/2007  DT: 06/01/2007  Job #: [id number]   cc:   Karie Schwalbe, MD

## 2010-09-25 NOTE — Assessment & Plan Note (Signed)
[facility]                            CARDIOLOGY OFFICE NOTE   PERLE, BRICKHOUSE                         MRN:          [id number]  DATE:02/20/2007                            DOB:          29-Oct-1931    PRIMARY:  Dr. Tillman Abide.   REASON FOR PRESENTATION:  Evaluate patient with atrial fibrillation and  dyspnea.   HISTORY OF PRESENT ILLNESS:  The patient presents for followup after  cardioversion.  She is 77 years old.  She had elective DC cardioversion,  which resulted in sinus rhythm from atrial fibrillation.  She says she  feels wonderful.  She says she has much more energy.  She has had  decreased lower extremity swelling.  She has had improved exercise  tolerance and decreased dyspnea.  She is able to walk through the  grocery store.  She has gone back to work in her [employer].  She is  walking around a little bit more and going places by herself.  She  denies any PND or orthopnea.  She has had no palpitations, pre-syncope,  or syncope.  She denies any chest pain.  She is not wearing her oxygen  as much as she used to.  She feels she is not as breathless.   PAST MEDICAL HISTORY:  Atrial fibrillation.  Pulmonary embolism and deep  vein thrombosis.  Diastolic heart failure.  Sleep apnea (apparently  mild).  Shingles.  Post-herpetic neuralgia.  Hypertension.  Morbid  obesity.  Meningitis.  Dyslipidemia.  Cholecystectomy.  Tonsillectomy.   ALLERGIES:  None.   CURRENT MEDICATIONS:  1. Lipitor 40 mg nightly.  2. Cardizem 240 mg daily.  3. Lasix 40 mg b.i.d.  4. Potassium 20 mEq daily.  5. Warfarin.  6. Lanoxin 0.25 mg daily.  7. Metoprolol 100 mg daily.   REVIEW OF SYSTEMS:  As stated in the HPI and otherwise negative for  other systems.   PHYSICAL EXAMINATION:  The patient is in no distress.  Blood pressure 130/94, heart rate is 68 and regular, body mass index 46,  weight 274 pounds.  HEENT:  Eyelids unremarkable.  Pupils are  equal, round, and reactive to  light and accommodation.  Fundi are not visualized.  Oral mucosa  unremarkable.  NECK:  No jugular venous distension, wave form within normal limits,  carotid upstroke brisk and symmetric, no bruits, thyromegaly.  LYMPHATICS:  No cervical, axillary, or inguinal adenopathy.  LUNGS:  Clear to auscultation bilaterally.  BACK:  No costovertebral angle tenderness.  CHEST:  Unremarkable.  HEART:  PMI not displaced or sustained, S1 and S2 within normal limits,  no S3, no S4, no clicks, rubs, murmurs.  ABDOMEN:  Obese, positive bowel sounds, normal in frequency and pitch,  no bruits, rebound, guarding.  No hepatomegaly, splenomegaly.  SKIN:  No rashes, no nodules.  EXTREMITIES:  With 2+, moderate but decreased lower extremity edema with  chronic probably lymphangitis.  NEURO:  Grossly intact.   EKG:  Sinus rhythm, rate 68, left axis deviation, no acute ST-T wave  changes.   ASSESSMENT AND PLAN:  1. Atrial  fibrillation.  The patient is maintaining sinus rhythm.  She      will continue on her Coumadin.  She will continue on medications as      listed.  She does feel much better.  2. Diastolic heart failure.  The patient seems to be euvolemic.  She      will continue on the medicine regimen as listed.  3. Obesity.  I told her she cannot eat bread as a first step towards      losing weight.  Despite the fact that she feels better and she is      walking more, she has not lost any weight.  Morbid obesity is her      most significant health problem.  She was given instructions on a      walking regimen to slowly increase.  4. Hypertension.  Blood pressure is well controlled.  She will      continue medications as listed.  5. Status post pulmonary embolism.  The patient on chronic Coumadin      therapy with her pulmonary embolism and her atrial fibrillation.  6. Followup.  Will see her back in about 3 months or sooner if needed.     Rollene Rotunda, MD, [facility]   Electronically Signed    JH/MedQ  DD: 02/20/2007  DT: 02/20/2007  Job #: [id number]   cc:   Karie Schwalbe, MD

## 2010-09-25 NOTE — Assessment & Plan Note (Signed)
[facility] OFFICE NOTE   [name] [name], [name] [name]                         MRN:          [id number]  DATE:07/20/2008                            DOB:          08-11-1931    PRIMARY CARE PHYSICIAN:  Karie Schwalbe, MD   CARDIOLOGIST:  Rollene Rotunda, MD, [facility]   INTERVAL HISTORY:  [name] [name] is a very pleasant 77 year old woman with  a history of chronic atrial fibrillation, previous pulmonary embolus  maintained on Coumadin, morbid obesity, diastolic heart failure,  hypertension and sleep apnea, who presents today for an unscheduled  followup.   Her husband apparently died recently.  After the funeral, it appears  that she developed stress-induced urticaria and was placed on a  prednisone taper for several weeks.  This just stopped yesterday.   Over the past several days, she has been experiencing a marked increase  in her baseline dyspnea.  It got acutely worse this morning.  She does  note that her weight has gone up about 15 or 20 pounds and in fact here  in clinic, it is up 32 pounds since April.  She has had worsening  orthopnea, but no PND.  She has not had any wheezing, no chest pain, and  no significant palpitations.   CURRENT MEDICATIONS:  1. Lipitor 40 mg a day.  2. Cardizem 240 a day.  3. Lasix 40 b.i.d.  4. Potassium 20 a day.  5. Warfarin 5 a day.  6. Lanoxin 0.25.  7. Metoprolol 100 a day.  8. Wellbutrin 150 a day.[name]   PHYSICAL EXAMINATION:  GENERAL:  She is a morbidly obese woman, who is  short of breath on any activity.  VITAL SIGNS:  Blood pressure was 140/90, heart rate was 110, weight was  311, and initial saturations were 92% on room air with respiratory rate  between 25 and 28.  HEENT:  Normal.  NECK:  Supple and thick.  Unable to evaluate JVD.  Carotids are 2+  bilaterally.  No obvious bruits.  There is no lymphadenopathy or  thyromegaly appreciated.  CARDIAC:  PMI was nonpalpable,  she was irregular.  No obvious murmurs.  LUNGS:  Clear.  ABDOMEN:  Morbidly obese, appears distended.  Hypoactive bowel sounds.  Unable to evaluate for hepatosplenomegaly.  No bruits.  No masses.  EXTREMITIES:  Edematous, probably 2 to 3+ edema with thick woody edema.  There are chronic venous stasis changes.  No rash.  NEURO:  She is alert  and oriented x3.  Cranial nerves II-XII are intact.  Moves all 4  extremities without difficulty.  Affect is pleasant.   EKG shows atrial fibrillation with rapid ventricular response at 102.  Nonspecific ST-T wave abnormalities.   ASSESSMENT AND PLAN:  1. [name] [name] clearly has acute on chronic diastolic heart failure,      likely secondary to recent prednisone taper and a massive volume      retention.  We placed on IV here in clinic and gave her 80 mg of IV      Lasix.  She  had a net of about 3 L out and is feeling much much      better.  We will go ahead and increase her Lasix to 80 mg b.i.d. to      continue diuresis.  If this does not work, I have also given her a      2.5 mg of metolazone to take as needed to help with diuresis.  We      will see her back on Friday to see how she is doing.  I have also      asked her to double up on her potassium and we will recheck a BNP      and BMET on Friday.  Of note, I did give her the option to be      admitted to the hospital, which she strongly wanted to avoid and      after diuresis, she clearly was much better and did not need      hospitalization.  However, if she does get worse, we will bring her      into the hospital as needed.  2. Atrial fibrillation.  Her rate is mildly elevated in the setting of      significant volume overload.  I suspect this will straighten itself      out as her volume status improves.   A total time with the encounter I spent over the course of the morning  about an hour with her.  She was here in the office, the better part of  the day for 6 or 7 hours.  Also, of  note, her INR was 3.3 today.     Bevelyn Buckles. Bensimhon, MD  Electronically Signed    DRB/MedQ  DD: 07/20/2008  DT: 07/21/2008  Job #: [id number]

## 2010-09-25 NOTE — Assessment & Plan Note (Signed)
[name] [name] OFFICE NOTE   [name] [name], [name] [name]                         MRN:          [id number]  DATE:07/22/2008                            DOB:          1931-07-12    PRIMARY CARE PHYSICIAN:  Karie Schwalbe, MD   PRIMARY CARDIOLOGIST:  Bevelyn Buckles. Bensimhon, MD   HISTORY OF PRESENT ILLNESS:  This is a 77 year old with history of  chronic atrial fibrillation and previous pulmonary embolus maintained on  Coumadin, morbid obesity, diastolic congestive heart failure,  hypertension, and sleep apnea who presents in followup for congestive  heart failure exacerbation.  The patient was last seen on July 20, 2008  when she came in with markedly increased shortness of breath and 15- to  20-pound weight gain.  This occurred after she took a prednisone taper  for urticaria.  CHF exacerbation was thought to be possibly related to  volume retention from her use of prednisone.  The patient was kept in  the office and given IV Lasix and her Lasix was increased 80 mg twice a  day at home.  She is down 5 pounds compared to her pretreatment weight  on July 20, 2008.  She does state that she still feels quite tired.  Her family is with her today and said she is short of breath walking  short distances.  She cannot talk and walk at the same time when walking  around her house.  She can get from room to room though she still seems  to be mildly short of breath.  She does have 3-pillow orthopnea.  She  says that she is not urinating as much as she was in when she was in the  office and received IV Lasix.  Today's blood pressure is 157/92, today's  heart rate is 96.   PAST MEDICAL HISTORY:  1. Chronic diastolic congestive heart failure.  The patient did have      an echocardiogram done in June 2008 showing mild LVH, EF 55-60%, a      mildly dilated right ventricle with mildly decreased systolic      function, pulmonary systolic  pressure 67 mmHg.  2. Chronic atrial fibrillation.  The patient has failed cardioversion      in the past.  3. History of PE/DVT.  4. Obstructive sleep apnea.  The patient is not using CPAP.  5. Shingles with postherpetic neuralgia.  6. Hypertension.  7. Obesity.   MEDICATIONS:  1. Lipitor 40 mg daily.  2. Cardizem CD 240 mg daily.  3. Warfarin.  4. Digoxin 0.25 mg daily.  5. Toprol-XL 100 mg daily.  6. Lasix 80 mg b.i.d.  7. Potassium chloride 40 mEq b.i.d.  8. Metolazone 2.5 mg p.r.n., the patient has actually not used her      metolazone.   Her most recent labs back in October 2009, creatinine is 1.1.   PHYSICAL EXAMINATION:  VITAL SIGNS:  Blood pressure is 157/92, heart  rate is 96 and irregular, O2 saturation 93% on room air.  The patient is  306 pounds  down from 311 pounds, last time she was here.  GENERAL:  This is an obese female in no apparent distress.  NEUROLOGIC:  Alert and oriented x3.  Normal affect.  NECK:  JVP is difficult to assess.  There is no thyromegaly or thyroid  nodule.  LUNGS:  Clear to auscultation bilaterally.  CARDIOVASCULAR:  Heart irregular S1 and S2.  No S3, no S4.  There is a  soft 1/6 systolic ejection murmur at the right upper sternal border.  There is 3+ woody chronic peripheral edema that rises to above the knees  bilaterally.  ABDOMEN:  Obese, soft, nontender.  No hepatosplenomegaly.   ASSESSMENT AND PLAN:  This is a 77 year old with a history of chronic  diastolic congestive heart failure and chronic atrial fibrillation who  presents to Cardiology Clinic with significant volume overload and a CHF  exacerbation.  1. Congestive heart failure.  The patient is still significantly      volume overloaded today.  She did respond well to IV Lasix in the      office 2 days ago.  However, she says that she has not had a      significantly increased amount of urine on the increased p.o.      Lasix.  She has not taken any of the metolazone.  She  does feel      better compared to prior to the last appointment; however, she is      still quite short of breath as confirmed by her family.  Today, I      will plan on having her start on her metolazone.  She will take      metolazone 2.5 mg twice a week, half an hour before her morning      Lasix.  She will take the first dose tomorrow.  She will take an      extra 40 mEq of potassium on days that she takes metolazone.      Hopefully, the combination of a couple of days a week of metolazone      as well as the increased dose of Lasix will allow her to slowly      diurese.  We will draw her labs today, Chem-7 and a BNP.  We will      have her back next week on Wednesday for another Chem-7 and then      she will follow up with Dr. Gala Romney on Thursday to see how she is      doing.  If her creatinine has tolerated this diuresis, it may be      reasonable to increase her metolazone frequency in order to get      significant volume off of her.  2. Atrial fibrillation.  The patient's heart rate is still in the 90s      to low 100s today.  Elevated heart rate is not helping her      diastolic congestive heart failure.  Her blood pressure is also up.      We will go ahead and increase her Toprol-XL to 150 mg a day.  Lower      heart rate should help her hemodynamics and lower blood pressure      will do the same.     Marca Ancona, MD  Electronically Signed    DM/MedQ  DD: 07/22/2008  DT: 07/23/2008  Job #: [id number]   cc:   Karie Schwalbe, MD

## 2010-09-25 NOTE — Assessment & Plan Note (Signed)
[facility] OFFICE NOTE   [name] [name], [name] [name]                         MRN:          [id number]  DATE:12/03/2006                            DOB:          03-27-1932    PRIMARY CARDIOLOGIST:  Rollene Rotunda, M.D.   PRIMARY CARE PHYSICIAN:  Karie Schwalbe, M.D.   PATIENT PROFILE:  This is a 77 year old Caucasian female with  recent  diagnosis of lower extremity DVT, pulmonary embolism, and atrial  fibrillation who presents for clinic follow-up.   PROBLEM LIST:  1. Atrial fibrillation.      a.     Diagnosed June 2008.      b.     November 03, 2006, 2-D echocardiogram, EF 55-65%, deep vein       thrombosis of the mildly dilated right ventricle and right atrium.  2. Lower extremity deep venous thrombosis diagnosed June 2008.  3. Pulmonary embolism diagnosed June 2008.  4. Diastolic heart failure with normal LV function as above.  5. History of shingles.  6. History of postherpetic neuralgia.  7. Hypertension.  8. Morbid obesity.  9. Hyperlipidemia.  10.History of meningitis.  11.Status post cholecystectomy.  12.Status post tonsillectomy.   ALLERGIES:  No known drug allergies.   HISTORY OF PRESENT ILLNESS:  This is a 77 year old Caucasian female with  the above problem list who was last seen in clinic November 27, 2006.  At  that time she was noted to be in atrial fibrillation with heart rates in  the 140s.  She was initiated on digoxin therapy and her Lopressor was  also increased to 25 mg q.i.d.  Since this change in her medications,  she has noted improvement in heart rate generally in the mid 90s as well  as significant improvement in her symptoms.  She did not experience  significant dyspnea on exertion or fatigue, although she notes that in  general since her hospitalization she has had decrease in energy.  She  thinks that overall this has improved though since over the past week.  She brought her blood  pressures and heart rates with her over the past  week and although her heart rates are improved into the 90s and high 80s  she also has had reduction in blood pressure to the 90s and low 100s.  She denies any presyncope or orthostasis.   She has been walking around her house which she says is not much  activity, but has been able to do this without any significant  limitations.  She is eager to become more active.   HOME MEDICATIONS:  1. Lipitor 40 mg q.h.s.  2. Cardizem 240 mg daily.  3. Lasix 40 mg b.i.d.  4. Protonix 40 mg daily.  5. Potassium chloride 20 mEq daily.  6. Warfarin 5 mg as directed.  7. Lanoxin 0.25 mg daily.  8. Metoprolol 25 mg q.i.d.   PHYSICAL EXAMINATION:  Blood pressure 98/58, heart rate is 102, but 92  on repeat.  Respirations 18.  She is afebrile.  Pleasant, obese white female in no acute distress.  Awake, alert, and  oriented x3.  NECK:  Obese, difficult to assess JVP.  LUNGS:  Respirations regular and labored.  Clear to auscultation.  CARDIAC:  Irregularly irregular, S1/S2.  No S3 or S4, murmurs.  ABDOMEN:  Obese, soft, nontender, nondistended.  Bowel sounds present  EXTREMITIES:  Warm, dry, pink.  No clubbing, cyanosis.  She does have  chronic venous stasis, discoloration of the lower extremities with  tenderness.  She also has 1+ ankle edema and 2+ pedal edema.   CLINICAL FINDINGS:  PT/INR is pending.   ASSESSMENT/PLAN:  1. Atrial fibrillation.  Rate is better controlled at the higher dose      of metoprolol and with the addition of digoxin.  There is room for      improvement in her rate control; however, her blood pressure will      be prohibitive as far as titrating her beta blocker or calcium      channel blocker.  Because she is symptomatically improved and her      heart rates are in the 90s, we will plan to stick with her current      doses of medications.  It appears unlikely that we would be able to      adequately rate control her  without further dropping her blood      pressure and therefore she is likely headed down the path to      cardioversion.  She had therapeutic INR last week which was checked      at [facility] and we will check her this week, next week, and the      following week as well, so that hopefully we can document four      therapeutic INRs and then subsequently plan cardioversion.  Will      have her seen back by Dr. Antoine Poche in the first week of August or      so.  2. Dyspnea.  Likely multifactorial, although seems to have improved      with better rate control.  3. Hypertension.  This is well-controlled and if anything she is on      the somewhat hypotensive side as above.  4. History of deep venous thrombosis and pedal edema.  She remains off      Coumadin therapy with a therapeutic INR last week and this is      followed at [facility].  5. Chest discomfort.  She has not had any additional chest discomfort      since achieving improved rate control.  Will consider ischemic      evaluation down the road.  6. Obesity.  This is a chronic issue that will need attention as she      is able to be more active.   DISPOSITION:  The patient will have INR checks over the next two weeks  and have follow-up with Dr. Antoine Poche following this with probable plans  for cardioversion.      Nicolasa Ducking, ANP  Electronically Signed      Rollene Rotunda, MD, [facility]  Electronically Signed   CB/MedQ  DD: 12/03/2006  DT: 12/03/2006  Job #: [id number]

## 2010-10-03 ENCOUNTER — Other Ambulatory Visit: Payer: Self-pay | Admitting: *Deleted

## 2010-10-03 MED ORDER — TORSEMIDE 20 MG PO TABS: 20.0000 mg | ORAL_TABLET | Freq: Every day | ORAL | Status: DC

## 2010-10-03 MED ORDER — VENLAFAXINE HCL ER 75 MG PO CP24: 75.0000 mg | ORAL_CAPSULE | Freq: Every day | ORAL | Status: DC

## 2010-10-04 ENCOUNTER — Ambulatory Visit: Payer: Medicare Other

## 2010-10-05 ENCOUNTER — Ambulatory Visit (INDEPENDENT_AMBULATORY_CARE_PROVIDER_SITE_OTHER): Payer: Medicare Other | Admitting: Family Medicine

## 2010-10-05 DIAGNOSIS — Z7901 Long term (current) use of anticoagulants: Secondary | ICD-10-CM

## 2010-10-05 DIAGNOSIS — Z5181 Encounter for therapeutic drug level monitoring: Secondary | ICD-10-CM

## 2010-10-05 DIAGNOSIS — I4891 Unspecified atrial fibrillation: Secondary | ICD-10-CM

## 2010-11-01 ENCOUNTER — Ambulatory Visit (INDEPENDENT_AMBULATORY_CARE_PROVIDER_SITE_OTHER): Payer: Medicare Other | Admitting: Family Medicine

## 2010-11-01 DIAGNOSIS — Z5181 Encounter for therapeutic drug level monitoring: Secondary | ICD-10-CM

## 2010-11-01 DIAGNOSIS — I4891 Unspecified atrial fibrillation: Secondary | ICD-10-CM

## 2010-11-01 DIAGNOSIS — Z7901 Long term (current) use of anticoagulants: Secondary | ICD-10-CM

## 2010-11-01 NOTE — Patient Instructions (Signed)
Hold dose today then 5 mg daily, except Tu,TH take 2.5 mg recheck in 1 week

## 2010-11-02 ENCOUNTER — Encounter: Payer: Self-pay | Admitting: Cardiovascular Disease

## 2010-11-04 ENCOUNTER — Other Ambulatory Visit: Payer: Self-pay | Admitting: *Deleted

## 2010-11-04 MED ORDER — METOLAZONE 2.5 MG PO TABS: 2.5000 mg | ORAL_TABLET | Freq: Every day | ORAL | Status: DC

## 2010-11-08 ENCOUNTER — Ambulatory Visit (INDEPENDENT_AMBULATORY_CARE_PROVIDER_SITE_OTHER): Payer: Medicare Other | Admitting: Family Medicine

## 2010-11-08 DIAGNOSIS — Z7901 Long term (current) use of anticoagulants: Secondary | ICD-10-CM

## 2010-11-08 DIAGNOSIS — Z5181 Encounter for therapeutic drug level monitoring: Secondary | ICD-10-CM

## 2010-11-08 DIAGNOSIS — I4891 Unspecified atrial fibrillation: Secondary | ICD-10-CM

## 2010-11-08 LAB — POCT INR: INR: 2.1

## 2010-11-08 NOTE — Patient Instructions (Signed)
Continue current dose, check in 2 weeks  

## 2010-11-22 ENCOUNTER — Ambulatory Visit (INDEPENDENT_AMBULATORY_CARE_PROVIDER_SITE_OTHER): Payer: Medicare Other | Admitting: Family Medicine

## 2010-11-22 DIAGNOSIS — Z5181 Encounter for therapeutic drug level monitoring: Secondary | ICD-10-CM

## 2010-11-22 DIAGNOSIS — I4891 Unspecified atrial fibrillation: Secondary | ICD-10-CM

## 2010-11-22 DIAGNOSIS — Z7901 Long term (current) use of anticoagulants: Secondary | ICD-10-CM

## 2010-11-22 NOTE — Patient Instructions (Signed)
Continue current dose, check in 4 weeks  

## 2010-12-06 ENCOUNTER — Other Ambulatory Visit: Payer: Self-pay | Admitting: Cardiovascular Disease

## 2010-12-06 MED ORDER — TORSEMIDE 20 MG PO TABS: 20.0000 mg | ORAL_TABLET | Freq: Two times a day (BID) | ORAL | Status: DC

## 2010-12-06 NOTE — Telephone Encounter (Signed)
Patient states that she is supposed to take 2 tablets a day instead of 1, which the Rx states.  Please call the patient once this has been completed.

## 2010-12-06 NOTE — Telephone Encounter (Signed)
Notified patient regarding torsemide.  She has been taking torsemide 20 mg twice a day.  After reviewing notes, she needs to be seen in the office by Dr. Mariah Milling.  Made a follow up appointment for January 03, 2011.

## 2010-12-13 ENCOUNTER — Ambulatory Visit: Payer: Medicare Other | Admitting: Cardiovascular Disease

## 2010-12-20 ENCOUNTER — Ambulatory Visit (INDEPENDENT_AMBULATORY_CARE_PROVIDER_SITE_OTHER): Payer: Medicare Other | Admitting: Internal Medicine

## 2010-12-20 DIAGNOSIS — I4891 Unspecified atrial fibrillation: Secondary | ICD-10-CM

## 2010-12-20 DIAGNOSIS — Z5181 Encounter for therapeutic drug level monitoring: Secondary | ICD-10-CM

## 2010-12-20 DIAGNOSIS — Z7901 Long term (current) use of anticoagulants: Secondary | ICD-10-CM

## 2010-12-20 NOTE — Patient Instructions (Signed)
Continue current dose, check in 4 weeks  

## 2011-01-03 ENCOUNTER — Ambulatory Visit: Payer: Medicare Other | Admitting: Cardiovascular Disease

## 2011-01-17 ENCOUNTER — Ambulatory Visit (INDEPENDENT_AMBULATORY_CARE_PROVIDER_SITE_OTHER): Payer: Medicare Other | Admitting: Family Medicine

## 2011-01-17 DIAGNOSIS — Z5181 Encounter for therapeutic drug level monitoring: Secondary | ICD-10-CM

## 2011-01-17 DIAGNOSIS — I4891 Unspecified atrial fibrillation: Secondary | ICD-10-CM

## 2011-01-17 DIAGNOSIS — Z7901 Long term (current) use of anticoagulants: Secondary | ICD-10-CM

## 2011-01-17 LAB — POCT INR: INR: 1.6

## 2011-01-17 NOTE — Patient Instructions (Signed)
Increase to 7.5 mg today, then 5 mg daily except 2.5 mg Tues, recheck 2 weeks at Dr Appt

## 2011-01-28 ENCOUNTER — Encounter: Payer: Self-pay | Admitting: Family Medicine

## 2011-01-28 ENCOUNTER — Ambulatory Visit (INDEPENDENT_AMBULATORY_CARE_PROVIDER_SITE_OTHER): Payer: Medicare Other | Admitting: Family Medicine

## 2011-01-28 VITALS — BP 140/80 | HR 90 | Temp 98.2°F

## 2011-01-28 DIAGNOSIS — Z136 Encounter for screening for cardiovascular disorders: Secondary | ICD-10-CM

## 2011-01-28 DIAGNOSIS — I10 Essential (primary) hypertension: Secondary | ICD-10-CM

## 2011-01-28 DIAGNOSIS — E7800 Pure hypercholesterolemia, unspecified: Secondary | ICD-10-CM

## 2011-01-28 DIAGNOSIS — I4891 Unspecified atrial fibrillation: Secondary | ICD-10-CM

## 2011-01-28 DIAGNOSIS — M2550 Pain in unspecified joint: Secondary | ICD-10-CM

## 2011-01-28 LAB — LIPID PANEL
HDL: 42.1 mg/dL (ref >39.00)
Triglycerides: 87 mg/dL (ref 0.0–149.0)

## 2011-01-28 LAB — BASIC METABOLIC PANEL
Calcium: 9.2 mg/dL (ref 8.4–10.5)
GFR: 57.76 mL/min — ABNORMAL LOW (ref >60.00)
Sodium: 137 mEq/L (ref 135–145)

## 2011-01-28 LAB — CBC WITH DIFFERENTIAL/PLATELET
Basophils Absolute: 0 10*3/uL (ref 0.0–0.1)
Hemoglobin: 14.7 g/dL (ref 12.0–15.0)
Lymphocytes Relative: 7.3 % — ABNORMAL LOW (ref 12.0–46.0)
Monocytes Relative: 8.2 % (ref 3.0–12.0)
Neutro Abs: 11.6 10*3/uL — ABNORMAL HIGH (ref 1.4–7.7)
RDW: 14.4 % (ref 11.5–14.6)

## 2011-01-28 LAB — URIC ACID: Uric Acid, Serum: 8.7 mg/dL — ABNORMAL HIGH (ref 2.4–7.0)

## 2011-01-28 MED ORDER — OXYCODONE HCL 10 MG PO TB12: 10.0000 mg | ORAL_TABLET | Freq: Three times a day (TID) | ORAL | Status: DC | PRN

## 2011-01-28 NOTE — Progress Notes (Signed)
Subjective:    Patient ID: [name] [name], female    DOB: Feb 17, 1932, 77 y.o.   MRN: [id number]  HPI  67 with complicated medical history, new to me here for acute onset of joint pain and swelling. Over the weekend, had sharp pain in her right toes.  That resolved. This morning, has severe pain in left toes with erythema and warmth.  Cannot put shoes on because it is so painful.  Never had anything like this before. Has chronic edema with h/o CHF, Afib, PE and DVT. On long term diuretics.  No SOB or CP.  No fevers but had chills last night.  Patient Active Problem List  Diagnoses  . POSTHERPETIC NEURALGIA  . OBESITY-MORBID (>100')  . DEPRESSION, ACUTE  . HYPERTENSION  . EMBOLISM & INFARCTION, IATROGENIC PULMONARY  . Atrial Fibrillation  . DIASTOLIC HEART FAILURE, CHRONIC  . ABDOMINAL ANEURYSM WITHOUT MENTION OF RUPTURE  . THROMBOSIS, VENOUS NOS  . ALLERGIC RHINITIS  . MENINGITIS, HX OF  . SHINGLES, HX OF  . OBSTRUCTIVE SLEEP APNEA  . SLEEP RELATED HYPOVENTILATION/HYPOXEMIA CCE  . PURE HYPERCHOLESTEROLEMIA  . Joint pain   Past Medical History  Diagnosis Date  . Congestive heart failure, unspecified     a. ECHO 6/0: EF 55-60%, mild LVH, mildly dilated RV w. mildly dec fx, RVSP 67  . Chronic atrial fibrillation     failed cardioversion in past  -repeat DC-CV on October 5th, 2010  . Personal history of DVT (deep vein thrombosis) 2008  . Obstructive sleep apnea   . HTN (hypertension)   . Morbid obesity   . Shingles     with postherpetic neuraigia  . GERD (gastroesophageal reflux disease)   . Hyperlipidemia   . Meningitis 1950s  . Gallstones     s/p cholecystectomy  . CAD (coronary artery disease)     nonobstructive by cath 8/10 - LAD 40-50% w mild PAH mean 29 w PVR 3.2 [place]  . AAA (abdominal aortic aneurysm)     small   Past Surgical History  Procedure Date  . Appendectomy 1947  . Tonsillectomy 1946  . Hospitalized with meningitis at [facility] 1960  .  Cholecystectomy   . Cardioversion 01/2007    x2   History  Substance Use Topics  . Smoking status: Never Smoker   . Smokeless tobacco: Not on file  . Alcohol Use: Yes     occasional   Family History  Problem Relation Age of Onset  . Heart failure Mother   . Depression Mother   . Ovarian cancer Paternal Grandmother   . Heart disease Paternal Grandfather   . Obesity Other    Allergies  Allergen Reactions  . Amoxicillin    Current Outpatient Prescriptions on File Prior to Visit  Medication Sig Dispense Refill  . aspirin (ASPIR-81) 81 MG EC tablet Take 81 mg by mouth daily.        . cetirizine (ZYRTEC ALLERGY) 10 MG tablet Take 10 mg by mouth daily.        [name] diltiazem (CARDIZEM CD) 120 MG 24 hr capsule Take 120 mg by mouth daily.        . metolazone (ZAROXOLYN) 2.5 MG tablet Take 1 tablet (2.5 mg total) by mouth daily.  30 tablet  1  . potassium chloride SA (KLOR-CON M20) 20 MEQ tablet Take 20 mEq by mouth daily. Take 2 tabs       . torsemide (DEMADEX) 20 MG tablet Take 1 tablet (20 mg total)  by mouth 2 (two) times daily.  30 tablet  6  . venlafaxine (EFFEXOR XR) 75 MG 24 hr capsule Take 1 capsule (75 mg total) by mouth daily.  30 capsule  11  . warfarin (COUMADIN) 5 MG tablet Take 5 mg by mouth. Per instructions: 1/2 tab alternating per the sheet with 1 whole tab        The PMH, PSH, Social History, Family History, Medications, and allergies have been reviewed in [facility], and have been updated if relevant.    Review of Systems    See HPI Objective:   Physical Exam  Physical Exam  BP 140/80  Pulse 90  Temp(Src) 98.2 F (36.8 C) (Oral)  SpO2 91% General: NAD, morbidly obese  Mouth: Oral mucosa and oropharynx without lesions or exudates. Teeth in good repair.  Pulses: chronic edema and  venous changes  Extremities: R and L posterior tibial pulses are full and equal bilaterally  healing scar oin left leg, right 2-4 toes erythematous, warm, very tender to touch. Skin: Intact  without suspicious lesions or rashes  Psych: Cognition and judgment appear intact. Alert and cooperative with normal attention span and concentration. No apparent delusions, illusions, hallucinations  Impression & Recommendations:      Assessment & Plan:   1. Joint pain  Uric acid, CBC w/Diff, Basic Metabolic Panel (BMET)  New.  Has chronic venous stasis but current presentation is consistent with gout. Will check uric acid, CBC and BMET today.  No adequate joint effusion to aspirate. Pt has CPX scheduled this week with primary MD. Advised need to keep that appointment for close follow up. Will give oxycodone for pain (cannot take hepatotoxic medications). The patient indicates understanding of these issues and agrees with the plan.

## 2011-01-28 NOTE — Patient Instructions (Signed)
Nice to meet you. Please keep your appointment with Dr. Ermalene Searing. We will call you with your lab results when we get them.

## 2011-01-29 ENCOUNTER — Other Ambulatory Visit: Payer: Medicare Other

## 2011-01-29 ENCOUNTER — Other Ambulatory Visit: Payer: Self-pay | Admitting: *Deleted

## 2011-01-29 MED ORDER — COLCHICINE 0.6 MG PO TABS: ORAL_TABLET | ORAL | Status: DC

## 2011-02-01 ENCOUNTER — Ambulatory Visit (INDEPENDENT_AMBULATORY_CARE_PROVIDER_SITE_OTHER): Payer: Medicare Other | Admitting: Family Medicine

## 2011-02-01 ENCOUNTER — Encounter: Payer: Self-pay | Admitting: Family Medicine

## 2011-02-01 ENCOUNTER — Ambulatory Visit: Payer: Medicare Other

## 2011-02-01 ENCOUNTER — Ambulatory Visit: Payer: Medicare Other | Admitting: Family Medicine

## 2011-02-01 DIAGNOSIS — F322 Major depressive disorder, single episode, severe without psychotic features: Secondary | ICD-10-CM

## 2011-02-01 DIAGNOSIS — B0229 Other postherpetic nervous system involvement: Secondary | ICD-10-CM

## 2011-02-01 DIAGNOSIS — Z23 Encounter for immunization: Secondary | ICD-10-CM

## 2011-02-01 DIAGNOSIS — E7800 Pure hypercholesterolemia, unspecified: Secondary | ICD-10-CM

## 2011-02-01 DIAGNOSIS — D72829 Elevated white blood cell count, unspecified: Secondary | ICD-10-CM

## 2011-02-01 DIAGNOSIS — I10 Essential (primary) hypertension: Secondary | ICD-10-CM

## 2011-02-01 DIAGNOSIS — I4891 Unspecified atrial fibrillation: Secondary | ICD-10-CM

## 2011-02-01 MED ORDER — VENLAFAXINE HCL ER 150 MG PO CP24: 150.0000 mg | ORAL_CAPSULE | Freq: Every day | ORAL | Status: DC

## 2011-02-01 NOTE — Assessment & Plan Note (Signed)
Toe and foot pain improved. ? High wbc due to inflammation from gout. Not clearly infection in foot. No other clear infectious symptoms, no fever. Will recehck wbc now that flare is improved.. Should be lower now.

## 2011-02-01 NOTE — Patient Instructions (Addendum)
Continue colchicine daily for the next few weeks, then stop. Use oxycodone only as needed for gout or shingles pain. For low uric acid diet: avoid red meat, alcohol, small fish, shellfish, shrimp, dry beans. If another gout flare occurs... Call. Increase effexor to 150 mg daily. We will call you with cbc report.. Call us if fever, cold symptoms etc.

## 2011-02-01 NOTE — Assessment & Plan Note (Signed)
Improved with oxycodone... Will use prn.

## 2011-02-01 NOTE — Assessment & Plan Note (Signed)
Inadequate control...increase effexor to 150 mg daily.  Follow up in next 1-2 months.

## 2011-02-01 NOTE — Progress Notes (Signed)
  Subjective:    Patient ID: [name] [name], female    DOB: July 25, 1931, 77 y.o.   MRN: [id number]  HPI  Hypertension:  Well controlled on  cardiazem  Using medication without problems or lightheadedness:  Chest pain with exertion:None Edema:stable Short of breath:None Average home [id number] Other issues:  Elevated Cholesterol: Improved with diet changes. Has gained some weight lately, minimal exercsie.  Gout, recent new diagnosis by Dr. Dayton Martes 9/17: Pain in toes, feet and ankles. Treated with colcrys daily Oxycodone helped with pain twice a day. Redness and swelling improved. Pain is much improved.. But almost gone.  Depression: Moderate control on effexor 75 mg daily. She remains frustrated with health. Still some decrease in motivation and desire.  Continues to have chronic pain from shingles: oxycodone helps this  A lot. She has stopped lyrica.  Atrial fibrillation: on flecanide Followed by Cardiology. On zaroxylyn and demadex for fluid overload.  Appears to have no excess fluid at this time.   Review of Systems  Constitutional: Negative for fever.  HENT: Negative for ear pain.   Eyes: Negative for pain.  Respiratory: Negative for chest tightness and shortness of breath.   Cardiovascular: Negative for chest pain, palpitations and leg swelling.  Gastrointestinal: Negative for abdominal pain.  Genitourinary: Negative for dysuria.  Psychiatric/Behavioral: Positive for dysphoric mood.       Objective:   Physical Exam  Constitutional: Vital signs are normal. She appears well-developed and well-nourished. She is cooperative.  Non-toxic appearance. She does not appear ill. No distress.  HENT:  Head: Normocephalic.  Right Ear: Hearing, tympanic membrane, external ear and ear canal normal. Tympanic membrane is not erythematous, not retracted and not bulging.  Left Ear: Hearing, tympanic membrane, external ear and ear canal normal. Tympanic membrane is not erythematous, not  retracted and not bulging.  Nose: No mucosal edema or rhinorrhea. Right sinus exhibits no maxillary sinus tenderness and no frontal sinus tenderness. Left sinus exhibits no maxillary sinus tenderness and no frontal sinus tenderness.  Mouth/Throat: Uvula is midline, oropharynx is clear and moist and mucous membranes are normal.  Eyes: Conjunctivae, EOM and lids are normal. Pupils are equal, round, and reactive to light. No foreign bodies found.  Neck: Trachea normal and normal range of motion. Neck supple. Carotid bruit is not present. No mass and no thyromegaly present.  Cardiovascular: Normal rate, regular rhythm, S1 normal, S2 normal, normal heart sounds, intact distal pulses and normal pulses.  Exam reveals no gallop and no friction rub.   No murmur heard. Pulmonary/Chest: Effort normal and breath sounds normal. Not tachypneic. No respiratory distress. She has no decreased breath sounds. She has no wheezes. She has no rhonchi. She has no rales.  Abdominal: Soft. Normal appearance and bowel sounds are normal. There is no tenderness.  Musculoskeletal:       No pain or redness in ankle or foot B, still slightly red in  Left 3rd and 4th toes, minimal pain  Full ROM at ankle.  Neurological: She is alert.  Skin: Skin is warm, dry and intact. No rash noted.  Psychiatric: Her speech is normal and behavior is normal. Judgment and thought content normal. Her mood appears not anxious. Cognition and memory are normal. She does not exhibit a depressed mood.          Assessment & Plan:

## 2011-02-01 NOTE — Assessment & Plan Note (Signed)
Improved with colchicine and oxycodone. Uric acid was high. Continue colchicine for next few weeks, low uric acid diet. Make diet changes to lower uric acid. We will recehck priro to next appt. If further flares or remaining high.. We can start allopurinol for prevention.

## 2011-02-01 NOTE — Assessment & Plan Note (Signed)
Stable, no fluid overload. Today INR 1.7, previous 1.6 ( got rid of one 2.5 mg dose)... Currently taking 5 mg daily except 2.5 on Tues.  no recent change in diet but on recent meds.

## 2011-02-01 NOTE — Assessment & Plan Note (Signed)
Well controlled. Continue current medication.  

## 2011-02-02 LAB — CBC WITH DIFFERENTIAL/PLATELET
Basophils Absolute: 0 10*3/uL (ref 0.0–0.1)
Basophils Relative: 0 % (ref 0–1)
Eosinophils Relative: 7 % — ABNORMAL HIGH (ref 0–5)
HCT: 45.7 % (ref 36.0–46.0)
Lymphocytes Relative: 14 % (ref 12–46)
MCHC: 33 g/dL (ref 30.0–36.0)
MCV: 102.5 fL — ABNORMAL HIGH (ref 78.0–100.0)
Monocytes Absolute: 1.2 10*3/uL — ABNORMAL HIGH (ref 0.1–1.0)
RDW: 13.7 % (ref 11.5–15.5)

## 2011-02-13 ENCOUNTER — Other Ambulatory Visit: Payer: Self-pay | Admitting: *Deleted

## 2011-02-13 MED ORDER — WARFARIN SODIUM 5 MG PO TABS: 5.0000 mg | ORAL_TABLET | Freq: Every day | ORAL | Status: DC

## 2011-02-13 MED ORDER — DILTIAZEM HCL ER COATED BEADS 120 MG PO CP24: 120.0000 mg | ORAL_CAPSULE | Freq: Every day | ORAL | Status: DC

## 2011-02-14 ENCOUNTER — Ambulatory Visit: Payer: Medicare Other

## 2011-02-15 ENCOUNTER — Ambulatory Visit (INDEPENDENT_AMBULATORY_CARE_PROVIDER_SITE_OTHER): Payer: Medicare Other | Admitting: Family Medicine

## 2011-02-15 DIAGNOSIS — I4891 Unspecified atrial fibrillation: Secondary | ICD-10-CM

## 2011-02-15 DIAGNOSIS — Z5181 Encounter for therapeutic drug level monitoring: Secondary | ICD-10-CM

## 2011-02-15 DIAGNOSIS — Z7901 Long term (current) use of anticoagulants: Secondary | ICD-10-CM

## 2011-02-15 NOTE — Patient Instructions (Signed)
5 mg daily except 2.5 mg Fridays( decreased 2.5 mg) check in 2 weeks ( patient has started pain medication that she will continue to be on)

## 2011-02-21 LAB — DIFFERENTIAL
Basophils Absolute: 0
Basophils Relative: 0
Eosinophils Absolute: 0.1
Eosinophils Relative: 1
Lymphocytes Relative: 16
Lymphs Abs: 1.2
Monocytes Absolute: 0.7
Monocytes Relative: 9
Neutro Abs: 5.9
Neutrophils Relative %: 74

## 2011-02-21 LAB — BASIC METABOLIC PANEL
BUN: 15
CO2: 29
Calcium: 9.1
Chloride: 107
Creatinine, Ser: 1.01
GFR calc Af Amer: >60
Glucose, Bld: 104 — ABNORMAL HIGH

## 2011-02-21 LAB — PROTIME-INR
INR: 2 — ABNORMAL HIGH
Prothrombin Time: 23.6 — ABNORMAL HIGH

## 2011-02-21 LAB — CBC
HCT: 42.1
Hemoglobin: 14.1
MCHC: 33.5
MCV: 93.7
Platelets: 260
RBC: 4.49
RDW: 15.6 — ABNORMAL HIGH
WBC: 7.9

## 2011-02-21 LAB — MAGNESIUM: Magnesium: 2.2

## 2011-02-26 LAB — CBC
Platelets: 249
Platelets: 272
RDW: 13.1
WBC: 15.9 — ABNORMAL HIGH

## 2011-02-26 LAB — PROTIME-INR
INR: 3.4 — ABNORMAL HIGH
INR: 3.4 — ABNORMAL HIGH
INR: 4.5 — ABNORMAL HIGH
Prothrombin Time: 36.9 — ABNORMAL HIGH
Prothrombin Time: 45.9 — ABNORMAL HIGH

## 2011-02-26 LAB — HEPARIN LEVEL (UNFRACTIONATED): Heparin Unfractionated: 0.82 — ABNORMAL HIGH

## 2011-02-26 LAB — BASIC METABOLIC PANEL
BUN: 44 — ABNORMAL HIGH
BUN: 45 — ABNORMAL HIGH
Calcium: 9.3
Chloride: 100
Creatinine, Ser: 1.3 — ABNORMAL HIGH
Creatinine, Ser: 1.36 — ABNORMAL HIGH
GFR calc non Af Amer: 38 — ABNORMAL LOW
Glucose, Bld: 102 — ABNORMAL HIGH

## 2011-02-26 LAB — B-NATRIURETIC PEPTIDE (CONVERTED LAB): Pro B Natriuretic peptide (BNP): 277 — ABNORMAL HIGH

## 2011-02-26 LAB — DIGOXIN LEVEL: Digoxin Level: 1.4

## 2011-02-27 LAB — BASIC METABOLIC PANEL
BUN: 15
BUN: 16
BUN: 16
BUN: 18
BUN: 19
BUN: 27 — ABNORMAL HIGH
CO2: 23
CO2: 25
CO2: 26
CO2: 29
CO2: 30
CO2: 32
Calcium: 8.9
Calcium: 9
Calcium: 9.2
Calcium: 9.4
Calcium: 9.5
Calcium: 9.7
Chloride: 100
Chloride: 100
Chloride: 101
Chloride: 101
Chloride: 102
Chloride: 103
Chloride: 103
Chloride: 105
Creatinine, Ser: 1.01
Creatinine, Ser: 1.07
Creatinine, Ser: 1.09
Creatinine, Ser: 1.1
Creatinine, Ser: 1.16
Creatinine, Ser: 1.31 — ABNORMAL HIGH
GFR calc Af Amer: 59 — ABNORMAL LOW
GFR calc Af Amer: >60
GFR calc non Af Amer: 43 — ABNORMAL LOW
GFR calc non Af Amer: 49 — ABNORMAL LOW
GFR calc non Af Amer: 49 — ABNORMAL LOW
Glucose, Bld: 103 — ABNORMAL HIGH
Glucose, Bld: 103 — ABNORMAL HIGH
Glucose, Bld: 112 — ABNORMAL HIGH
Glucose, Bld: 115 — ABNORMAL HIGH
Glucose, Bld: 118 — ABNORMAL HIGH
Glucose, Bld: 133 — ABNORMAL HIGH
Glucose, Bld: 80
Glucose, Bld: 89
Potassium: 3.5
Potassium: 4.3
Sodium: 135
Sodium: 138
Sodium: 140

## 2011-02-27 LAB — I-STAT 8, (EC8 V) (CONVERTED LAB)
Acid-base deficit: 4 — ABNORMAL HIGH
Chloride: 100
Potassium: 3.6
pH, Ven: 7.318 — ABNORMAL HIGH

## 2011-02-27 LAB — DIFFERENTIAL
Basophils Relative: 1
Eosinophils Absolute: 0.2
Eosinophils Relative: 2
Monocytes Absolute: 0.6
Monocytes Relative: 5

## 2011-02-27 LAB — CBC
HCT: 34.7 — ABNORMAL LOW
HCT: 35 — ABNORMAL LOW
HCT: 35.8 — ABNORMAL LOW
HCT: 38.1
Hemoglobin: 11.8 — ABNORMAL LOW
Hemoglobin: 12.1
Hemoglobin: 12.8
Hemoglobin: 12.9
Hemoglobin: 13.6
MCHC: 33.7
MCHC: 33.7
MCHC: 33.8
MCHC: 33.9
MCHC: 33.9
MCHC: 34.3
MCV: 95
MCV: 95.2
MCV: 95.4
MCV: 95.5
MCV: 95.8
MCV: 95.8
MCV: 95.9
Platelets: 154
Platelets: 174
Platelets: 178
Platelets: 187
Platelets: 220
Platelets: 233
Platelets: 274
RBC: 3.65 — ABNORMAL LOW
RBC: 3.77 — ABNORMAL LOW
RBC: 3.94
RDW: 13.3
RDW: 13.3
RDW: 13.4
RDW: 13.4
RDW: 13.5
RDW: 13.6
WBC: 12.3 — ABNORMAL HIGH
WBC: 13.3 — ABNORMAL HIGH
WBC: 9.1
WBC: 9.5

## 2011-02-27 LAB — COMPREHENSIVE METABOLIC PANEL
ALT: 26
Albumin: 3.5
Alkaline Phosphatase: 76
Chloride: 98
Glucose, Bld: 160 — ABNORMAL HIGH
Potassium: 3.7
Sodium: 133 — ABNORMAL LOW
Total Protein: 7.3

## 2011-02-27 LAB — URINE CULTURE
Colony Count: NO GROWTH
Culture: NO GROWTH
Special Requests: NEGATIVE

## 2011-02-27 LAB — URINE MICROSCOPIC-ADD ON

## 2011-02-27 LAB — URINALYSIS, ROUTINE W REFLEX MICROSCOPIC
Bilirubin Urine: NEGATIVE
Glucose, UA: NEGATIVE
Glucose, UA: NEGATIVE
Hgb urine dipstick: NEGATIVE
Hgb urine dipstick: NEGATIVE
Specific Gravity, Urine: 1.006
Specific Gravity, Urine: 1.019
Urobilinogen, UA: 0.2
pH: 5.5
pH: 6.5

## 2011-02-27 LAB — PROTIME-INR
INR: 1.9 — ABNORMAL HIGH
INR: 2.2 — ABNORMAL HIGH
Prothrombin Time: 14.6
Prothrombin Time: 19.9 — ABNORMAL HIGH
Prothrombin Time: 22.8 — ABNORMAL HIGH
Prothrombin Time: 23.3 — ABNORMAL HIGH
Prothrombin Time: 25.1 — ABNORMAL HIGH

## 2011-02-27 LAB — CARDIAC PANEL(CRET KIN+CKTOT+MB+TROPI)
Relative Index: 7.5 — ABNORMAL HIGH
Total CK: 100
Troponin I: 0.4 — ABNORMAL HIGH
Troponin I: 0.46 — ABNORMAL HIGH

## 2011-02-27 LAB — D-DIMER, QUANTITATIVE: D-Dimer, Quant: 8.96 — ABNORMAL HIGH

## 2011-02-27 LAB — LIPID PANEL
HDL: 40
LDL Cholesterol: 92
Total CHOL/HDL Ratio: 3.6
Triglycerides: 53
VLDL: 11

## 2011-02-27 LAB — HEPARIN LEVEL (UNFRACTIONATED)
Heparin Unfractionated: 0.43
Heparin Unfractionated: 0.52
Heparin Unfractionated: 0.7
Heparin Unfractionated: 0.72 — ABNORMAL HIGH

## 2011-02-27 LAB — CK TOTAL AND CKMB (NOT AT ARMC): CK, MB: 5.9 — ABNORMAL HIGH

## 2011-02-27 LAB — POCT CARDIAC MARKERS
CKMB, poc: 1.1
Myoglobin, poc: 102
Myoglobin, poc: 228
Operator id: 189501
Operator id: 196461
Troponin i, poc: <0.05

## 2011-02-27 LAB — B-NATRIURETIC PEPTIDE (CONVERTED LAB)
Pro B Natriuretic peptide (BNP): 432 — ABNORMAL HIGH
Pro B Natriuretic peptide (BNP): 466 — ABNORMAL HIGH

## 2011-02-27 LAB — POCT I-STAT CREATININE: Creatinine, Ser: 1.2

## 2011-02-28 ENCOUNTER — Ambulatory Visit (INDEPENDENT_AMBULATORY_CARE_PROVIDER_SITE_OTHER): Payer: Medicare Other | Admitting: Family Medicine

## 2011-02-28 DIAGNOSIS — Z7901 Long term (current) use of anticoagulants: Secondary | ICD-10-CM

## 2011-02-28 DIAGNOSIS — I4891 Unspecified atrial fibrillation: Secondary | ICD-10-CM

## 2011-02-28 DIAGNOSIS — Z5181 Encounter for therapeutic drug level monitoring: Secondary | ICD-10-CM

## 2011-02-28 LAB — POCT INR: INR: 3.6

## 2011-02-28 NOTE — Patient Instructions (Addendum)
  5 mg daily except 2.5 mg Mondays and Fridays( decreased another  2.5 mg) check in 2 weeks ( patient has started pain medication that she will continue to be on)

## 2011-03-18 ENCOUNTER — Ambulatory Visit: Payer: Medicare Other

## 2011-03-21 ENCOUNTER — Ambulatory Visit (INDEPENDENT_AMBULATORY_CARE_PROVIDER_SITE_OTHER): Payer: Medicare Other | Admitting: Family Medicine

## 2011-03-21 DIAGNOSIS — Z5181 Encounter for therapeutic drug level monitoring: Secondary | ICD-10-CM

## 2011-03-21 DIAGNOSIS — I4891 Unspecified atrial fibrillation: Secondary | ICD-10-CM

## 2011-03-21 DIAGNOSIS — Z7901 Long term (current) use of anticoagulants: Secondary | ICD-10-CM

## 2011-03-21 NOTE — Patient Instructions (Signed)
5 mg daily except 2.5 mg MWF( decreased another  2.5 mg) check in 2 weeks. Hold today's dose 1 1/2 weeks for recheck

## 2011-03-28 ENCOUNTER — Other Ambulatory Visit: Payer: Self-pay | Admitting: Internal Medicine

## 2011-03-28 MED ORDER — FLECAINIDE ACETATE 100 MG PO TABS: 100.0000 mg | ORAL_TABLET | Freq: Two times a day (BID) | ORAL | Status: DC

## 2011-04-03 ENCOUNTER — Ambulatory Visit (INDEPENDENT_AMBULATORY_CARE_PROVIDER_SITE_OTHER): Payer: Medicare Other | Admitting: Family Medicine

## 2011-04-03 DIAGNOSIS — Z5181 Encounter for therapeutic drug level monitoring: Secondary | ICD-10-CM

## 2011-04-03 DIAGNOSIS — Z7901 Long term (current) use of anticoagulants: Secondary | ICD-10-CM

## 2011-04-03 DIAGNOSIS — I4891 Unspecified atrial fibrillation: Secondary | ICD-10-CM

## 2011-04-03 NOTE — Patient Instructions (Signed)
5 mg daily except 2.5 mg MWF, check in 2 weeks

## 2011-04-11 ENCOUNTER — Ambulatory Visit (INDEPENDENT_AMBULATORY_CARE_PROVIDER_SITE_OTHER): Payer: Medicare Other | Admitting: Family Medicine

## 2011-04-11 ENCOUNTER — Encounter: Payer: Self-pay | Admitting: Family Medicine

## 2011-04-11 DIAGNOSIS — I5032 Chronic diastolic (congestive) heart failure: Secondary | ICD-10-CM

## 2011-04-11 DIAGNOSIS — I10 Essential (primary) hypertension: Secondary | ICD-10-CM

## 2011-04-11 DIAGNOSIS — B0229 Other postherpetic nervous system involvement: Secondary | ICD-10-CM

## 2011-04-11 DIAGNOSIS — F322 Major depressive disorder, single episode, severe without psychotic features: Secondary | ICD-10-CM

## 2011-04-11 DIAGNOSIS — I509 Heart failure, unspecified: Secondary | ICD-10-CM

## 2011-04-11 DIAGNOSIS — I4891 Unspecified atrial fibrillation: Secondary | ICD-10-CM

## 2011-04-11 NOTE — Assessment & Plan Note (Signed)
No current fluid overload. 

## 2011-04-11 NOTE — Progress Notes (Signed)
  Subjective:    Patient ID: [name] [name], female    DOB: [birth year], 77 y.o.   MRN: [id number]  HPI  Hypertension: Well controlled on cardiazem  Using medication without problems or lightheadedness:  Chest pain with exertion:None  Edema:stable  Short of breath:None  Average home WGN:[id number]  Other issues:   Gout, recent new diagnosis by Dr. Dayton Martes 9/17: Pain in toes, feet and ankles resolved.   Depression: Better control on effexor 75 mg daily.  Still some decrease in motivation and desire.  Continues to have chronic pain from shingles: oxycodone helps this  A lot.. Taking daily now because she felt like using prn was not hleping as much. She does not feel sleepy when she takes it. No current constipation. She has stopped lyrica.  No relief  neurontin.   Atrial fibrillation: on flecanide Followed by Cardiology.  On zaroxylyn and demadex for fluid overload.  Appears to have no excess fluid at this time.       Review of Systems  Constitutional: Negative for fever and fatigue.  HENT: Negative for ear pain.   Eyes: Negative for pain.  Respiratory: Positive for shortness of breath. Negative for chest tightness.        Stable  Cardiovascular: Negative for chest pain, palpitations and leg swelling.  Gastrointestinal: Negative for abdominal pain.  Genitourinary: Negative for dysuria.       Objective:   Physical Exam  Constitutional: Vital signs are normal. She appears well-developed and well-nourished. She is cooperative.  Non-toxic appearance. She does not appear ill. No distress.  HENT:  Head: Normocephalic.  Right Ear: Hearing, tympanic membrane, external ear and ear canal normal. Tympanic membrane is not erythematous, not retracted and not bulging.  Left Ear: Hearing, tympanic membrane, external ear and ear canal normal. Tympanic membrane is not erythematous, not retracted and not bulging.  Nose: No mucosal edema or rhinorrhea. Right sinus exhibits no maxillary sinus  tenderness and no frontal sinus tenderness. Left sinus exhibits no maxillary sinus tenderness and no frontal sinus tenderness.  Mouth/Throat: Uvula is midline, oropharynx is clear and moist and mucous membranes are normal.  Eyes: Conjunctivae, EOM and lids are normal. Pupils are equal, round, and reactive to light. No foreign bodies found.  Neck: Trachea normal and normal range of motion. Neck supple. Carotid bruit is not present. No mass and no thyromegaly present.  Cardiovascular: Normal rate, regular rhythm, S1 normal, S2 normal, normal heart sounds, intact distal pulses and normal pulses.  Exam reveals no gallop and no friction rub.   No murmur heard. Pulmonary/Chest: Effort normal and breath sounds normal. Not tachypneic. No respiratory distress. She has no decreased breath sounds. She has no wheezes. She has no rhonchi. She has no rales.  Abdominal: Soft. Normal appearance and bowel sounds are normal. There is no tenderness.  Neurological: She is alert.  Skin: Skin is warm, dry and intact. No rash noted.  Psychiatric: She has a normal mood and affect. Her speech is normal and behavior is normal. Judgment and thought content normal. Her mood appears not anxious. Cognition and memory are normal. She does not exhibit a depressed mood.          Assessment & Plan:

## 2011-04-11 NOTE — Assessment & Plan Note (Signed)
Stable rate control, on anticoag with coumadin.

## 2011-04-11 NOTE — Patient Instructions (Addendum)
Work on regular exercise, healthy eating and weight loss.  Continue oxycontin, but try trial of every other day to see if pain still controlled without SE. To avoid constipation.. Increase fiber, use metamucil. Can use mirilax to treat if needed.  Follow up in 09/2010 for Annual Medicare Wellness with labs prior.

## 2011-04-11 NOTE — Assessment & Plan Note (Signed)
Well controlled. Continue current medication.  

## 2011-04-11 NOTE — Assessment & Plan Note (Signed)
Pain control dramatically inproved on oxycontin daily.. Will try to decrease to every other day. DIscussed SE of constipation, obstruction and sedation. She is not currently having any of these.  We will plan long term treatment with oxycontin given this has given her more relief than any other med tried in the past. She understands risks associated.

## 2011-04-11 NOTE — Assessment & Plan Note (Signed)
Improved control with improved pain.

## 2011-04-17 ENCOUNTER — Ambulatory Visit: Payer: Medicare Other

## 2011-04-18 ENCOUNTER — Ambulatory Visit (INDEPENDENT_AMBULATORY_CARE_PROVIDER_SITE_OTHER): Payer: Medicare Other | Admitting: Family Medicine

## 2011-04-18 DIAGNOSIS — I4891 Unspecified atrial fibrillation: Secondary | ICD-10-CM

## 2011-04-18 DIAGNOSIS — Z5181 Encounter for therapeutic drug level monitoring: Secondary | ICD-10-CM

## 2011-04-18 DIAGNOSIS — Z7901 Long term (current) use of anticoagulants: Secondary | ICD-10-CM

## 2011-04-18 LAB — POCT INR: INR: 2.4

## 2011-04-18 NOTE — Patient Instructions (Signed)
Continue current dose, check in 4 weeks  

## 2011-04-23 ENCOUNTER — Other Ambulatory Visit: Payer: Self-pay | Admitting: *Deleted

## 2011-04-23 MED ORDER — COLCHICINE 0.6 MG PO TABS: ORAL_TABLET | ORAL | Status: DC

## 2011-04-23 NOTE — Telephone Encounter (Signed)
Pt requesting refill for gout flare up.

## 2011-05-01 ENCOUNTER — Other Ambulatory Visit: Payer: Self-pay | Admitting: Internal Medicine

## 2011-05-01 NOTE — Telephone Encounter (Signed)
Patient request refill on Oxycodone

## 2011-05-02 MED ORDER — OXYCODONE HCL 10 MG PO TB12: 10.0000 mg | ORAL_TABLET | Freq: Two times a day (BID) | ORAL | Status: DC | PRN

## 2011-05-02 NOTE — Telephone Encounter (Signed)
Dosing should be twice a day, not three times a day. Call pt to clarify. Refill only 60 at a time.

## 2011-05-02 NOTE — Telephone Encounter (Signed)
Patient notified as instructed by telephone. Prescription left at front desk.  

## 2011-05-03 ENCOUNTER — Other Ambulatory Visit: Payer: Self-pay | Admitting: *Deleted

## 2011-05-03 MED ORDER — METOLAZONE 2.5 MG PO TABS: 2.5000 mg | ORAL_TABLET | Freq: Every day | ORAL | Status: DC

## 2011-05-16 ENCOUNTER — Ambulatory Visit (INDEPENDENT_AMBULATORY_CARE_PROVIDER_SITE_OTHER): Payer: Medicare Other | Admitting: Family Medicine

## 2011-05-16 DIAGNOSIS — I4891 Unspecified atrial fibrillation: Secondary | ICD-10-CM

## 2011-05-16 DIAGNOSIS — Z7901 Long term (current) use of anticoagulants: Secondary | ICD-10-CM

## 2011-05-16 DIAGNOSIS — Z5181 Encounter for therapeutic drug level monitoring: Secondary | ICD-10-CM

## 2011-05-16 NOTE — Patient Instructions (Signed)
Continue current dose, check in 4 weeks  

## 2011-06-13 ENCOUNTER — Ambulatory Visit (INDEPENDENT_AMBULATORY_CARE_PROVIDER_SITE_OTHER): Payer: Medicare Other | Admitting: Family Medicine

## 2011-06-13 DIAGNOSIS — Z5181 Encounter for therapeutic drug level monitoring: Secondary | ICD-10-CM

## 2011-06-13 DIAGNOSIS — I4891 Unspecified atrial fibrillation: Secondary | ICD-10-CM

## 2011-06-13 DIAGNOSIS — Z7901 Long term (current) use of anticoagulants: Secondary | ICD-10-CM

## 2011-06-13 NOTE — Patient Instructions (Addendum)
5 mg daily except 2.5 mg MWF,( take  a 5 mg this Friday only)check in 2 weeks

## 2011-06-17 ENCOUNTER — Telehealth: Payer: Self-pay | Admitting: *Deleted

## 2011-06-17 MED ORDER — COLCHICINE 0.6 MG PO TABS: ORAL_TABLET | ORAL | Status: DC

## 2011-06-17 NOTE — Telephone Encounter (Signed)
Ok to refill, colcrys, 1 po bid. #60, 3 refills  If not improving in a few days, will need eval

## 2011-06-17 NOTE — Telephone Encounter (Signed)
rx sent advised patient as instructed

## 2011-06-17 NOTE — Telephone Encounter (Signed)
Patient called and stated that she is having a gout flare in her feet and wanted to know if she can have a refill on Colcrys or if she needed to schedule an appt to be seen.  She stated that she was given this before but she has not refills.  Uses [facility] in [place].  Please advise.

## 2011-06-18 ENCOUNTER — Other Ambulatory Visit: Payer: Self-pay | Admitting: *Deleted

## 2011-06-18 MED ORDER — COLCHICINE 0.6 MG PO TABS: ORAL_TABLET | ORAL | Status: DC

## 2011-06-18 NOTE — Telephone Encounter (Signed)
rx sent to right pharmacy

## 2011-06-24 ENCOUNTER — Other Ambulatory Visit: Payer: Self-pay | Admitting: *Deleted

## 2011-06-27 ENCOUNTER — Ambulatory Visit (INDEPENDENT_AMBULATORY_CARE_PROVIDER_SITE_OTHER): Payer: Medicare Other | Admitting: Family Medicine

## 2011-06-27 DIAGNOSIS — Z7901 Long term (current) use of anticoagulants: Secondary | ICD-10-CM

## 2011-06-27 DIAGNOSIS — I4891 Unspecified atrial fibrillation: Secondary | ICD-10-CM

## 2011-06-27 DIAGNOSIS — Z5181 Encounter for therapeutic drug level monitoring: Secondary | ICD-10-CM

## 2011-06-27 LAB — POCT INR: INR: 2.2

## 2011-06-27 NOTE — Patient Instructions (Signed)
5 mg daily except 2.5 mg MW, increased by 2.5 mg weekly 2 weeks

## 2011-07-08 ENCOUNTER — Other Ambulatory Visit: Payer: Self-pay | Admitting: *Deleted

## 2011-07-08 MED ORDER — WARFARIN SODIUM 5 MG PO TABS: 5.0000 mg | ORAL_TABLET | Freq: Every day | ORAL | Status: DC

## 2011-07-11 ENCOUNTER — Ambulatory Visit: Payer: Medicare Other | Admitting: Family Medicine

## 2011-07-11 ENCOUNTER — Ambulatory Visit: Payer: Medicare Other

## 2011-07-12 ENCOUNTER — Ambulatory Visit: Payer: Medicare Other

## 2011-07-12 ENCOUNTER — Other Ambulatory Visit: Payer: Self-pay | Admitting: Family Medicine

## 2011-07-12 ENCOUNTER — Ambulatory Visit: Payer: Medicare Other | Admitting: Family Medicine

## 2011-07-12 MED ORDER — OXYCODONE HCL 10 MG PO TB12: 10.0000 mg | ORAL_TABLET | Freq: Two times a day (BID) | ORAL | Status: DC | PRN

## 2011-07-12 NOTE — Telephone Encounter (Signed)
Pt needs refill on her pain meds Oxycontin 10 mg.

## 2011-07-12 NOTE — Telephone Encounter (Signed)
Herbert Seta can you enter as refill request so I can see when it was last refilled? Thanks

## 2011-07-12 NOTE — Telephone Encounter (Signed)
Was refilled 05-01-2011.

## 2011-07-12 NOTE — Telephone Encounter (Signed)
Patient advised rx ready for pick up 

## 2011-07-15 ENCOUNTER — Ambulatory Visit (INDEPENDENT_AMBULATORY_CARE_PROVIDER_SITE_OTHER): Payer: Medicare Other | Admitting: Pulmonary Disease

## 2011-07-15 ENCOUNTER — Encounter: Payer: Self-pay | Admitting: Pulmonary Disease

## 2011-07-15 VITALS — BP 126/76 | HR 74 | Temp 97.7°F | Ht 64.0 in | Wt 315.6 lb

## 2011-07-15 DIAGNOSIS — G4733 Obstructive sleep apnea (adult) (pediatric): Secondary | ICD-10-CM

## 2011-07-15 DIAGNOSIS — G4736 Sleep related hypoventilation in conditions classified elsewhere: Secondary | ICD-10-CM

## 2011-07-15 DIAGNOSIS — G4730 Sleep apnea, unspecified: Secondary | ICD-10-CM

## 2011-07-15 NOTE — Assessment & Plan Note (Signed)
She is to continue supplemental oxygen 24/7. 

## 2011-07-15 NOTE — Patient Instructions (Signed)
Will get copy of CPAP report and call with results Follow up in one year 

## 2011-07-15 NOTE — Assessment & Plan Note (Signed)
She is compliant with therapy, and reports benefit from therapy.  Will get her CPAP download and call with results.

## 2011-07-15 NOTE — Progress Notes (Signed)
Chief Complaint  Patient presents with  . Follow-up    Pt coing well on CPAP. Wearing an avg of 7-8 hrs a night. No pressure complaints.    History of Present Illness: [name] [name] is a 78 y.o. female with OSA/OHS.  She has been doing well with CPAP and supplemental oxygen.  She goes to bed at midnight.  She sleeps through the night.  She wakes up at 8 am.    Past Medical History  Diagnosis Date  . Congestive heart failure, unspecified     a. ECHO 6/0: EF 55-60%, mild LVH, mildly dilated RV w. mildly dec fx, RVSP 67  . Chronic atrial fibrillation     failed cardioversion in past  -repeat DC-CV on October 5th, 2010  . Personal history of DVT (deep vein thrombosis) 2008  . Obstructive sleep apnea   . HTN (hypertension)   . Morbid obesity   . Shingles     with postherpetic neuraigia  . GERD (gastroesophageal reflux disease)   . Hyperlipidemia   . Meningitis 1950s  . Gallstones     s/p cholecystectomy  . CAD (coronary artery disease)     nonobstructive by cath 8/10 - LAD 40-50% w mild PAH mean 29 w PVR 3.2 [place]  . AAA (abdominal aortic aneurysm)     small    Past Surgical History  Procedure Date  . Appendectomy 1947  . Tonsillectomy 1946  . Hospitalized with meningitis at [facility] 1960  . Cholecystectomy   . Cardioversion 01/2007    x2    Allergies  Allergen Reactions  . Amoxicillin     Physical Exam:  Blood pressure 126/76, pulse 74, temperature 97.7 F (36.5 C), temperature source Oral, height 5\' 4"  (1.626 m), weight 315 lb 9.6 oz (143.155 kg), SpO2 93.00%. Body mass index is 54.17 kg/(m^2). Wt Readings from Last 2 Encounters:  07/15/11 315 lb 9.6 oz (143.155 kg)  04/11/11 312 lb 6.4 oz (141.704 kg)    General - Obese, using supplemental oxygen HEENT - no sinus tenderness, no oral exudate Cardiac - s1s2 regular Chest - no wheeze Abdomen - obese Extremities - 1+ leg edema Skin - chronic venous stasis changes Neurologic - normal  strength Psychiatric - normal mood, behavior   Assessment/Plan:  Outpatient Encounter Prescriptions as of 07/15/2011  Medication Sig Dispense Refill  . aspirin (ASPIR-81) 81 MG EC tablet Take 81 mg by mouth daily.        . cetirizine (ZYRTEC ALLERGY) 10 MG tablet Take 10 mg by mouth daily.        . colchicine (COLCRYS) 0.6 MG tablet Take two tablets by mouth now, in one hour take one tablet by mouth, then one tablet by mouth daily  60 tablet  3  . diltiazem (CARDIZEM CD) 120 MG 24 hr capsule Take 1 capsule (120 mg total) by mouth daily.  30 capsule  6  . flecainide (TAMBOCOR) 100 MG tablet Take 1 tablet (100 mg total) by mouth every 12 (twelve) hours.  60 tablet  1  . metolazone (ZAROXOLYN) 2.5 MG tablet Take 1 tablet (2.5 mg total) by mouth daily.  30 tablet  2  . oxyCODONE (OXYCONTIN) 10 MG 12 hr tablet Take 1 tablet (10 mg total) by mouth every 12 (twelve) hours as needed for pain.  60 tablet  0  . potassium chloride SA (KLOR-CON M20) 20 MEQ tablet Take 20 mEq by mouth daily. Take 2 tabs       . torsemide (  DEMADEX) 20 MG tablet Take 20 mg by mouth daily.      [name] venlafaxine (EFFEXOR XR) 150 MG 24 hr capsule Take 1 capsule (150 mg total) by mouth daily.  30 capsule  11  . warfarin (COUMADIN) 5 MG tablet Take 1 tablet (5 mg total) by mouth daily. Per instructions: 1/2 tab alternating per the sheet with 1 whole tab  30 tablet  6  . DISCONTD: torsemide (DEMADEX) 20 MG tablet Take 1 tablet (20 mg total) by mouth 2 (two) times daily.  30 tablet  6    Eliya Bubar Pager:  [phone] 07/15/2011, 4:55 PM

## 2011-07-16 ENCOUNTER — Encounter: Payer: Self-pay | Admitting: *Deleted

## 2011-07-17 ENCOUNTER — Ambulatory Visit (INDEPENDENT_AMBULATORY_CARE_PROVIDER_SITE_OTHER): Payer: Medicare Other | Admitting: Cardiovascular Disease

## 2011-07-17 ENCOUNTER — Encounter: Payer: Self-pay | Admitting: Cardiovascular Disease

## 2011-07-17 VITALS — BP 142/76 | HR 78 | Ht 64.0 in | Wt 313.0 lb

## 2011-07-17 DIAGNOSIS — I10 Essential (primary) hypertension: Secondary | ICD-10-CM

## 2011-07-17 DIAGNOSIS — I509 Heart failure, unspecified: Secondary | ICD-10-CM

## 2011-07-17 DIAGNOSIS — I4891 Unspecified atrial fibrillation: Secondary | ICD-10-CM

## 2011-07-17 DIAGNOSIS — I5032 Chronic diastolic (congestive) heart failure: Secondary | ICD-10-CM

## 2011-07-17 MED ORDER — DILTIAZEM HCL ER COATED BEADS 120 MG PO CP24: 120.0000 mg | ORAL_CAPSULE | Freq: Every day | ORAL | Status: DC

## 2011-07-17 MED ORDER — FLECAINIDE ACETATE 100 MG PO TABS: 100.0000 mg | ORAL_TABLET | Freq: Two times a day (BID) | ORAL | Status: DC

## 2011-07-17 MED ORDER — METOLAZONE 2.5 MG PO TABS: 2.5000 mg | ORAL_TABLET | Freq: Every day | ORAL | Status: DC

## 2011-07-17 MED ORDER — TORSEMIDE 20 MG PO TABS: 20.0000 mg | ORAL_TABLET | Freq: Every day | ORAL | Status: DC

## 2011-07-17 MED ORDER — POTASSIUM CHLORIDE CRYS ER 20 MEQ PO TBCR: 20.0000 meq | EXTENDED_RELEASE_TABLET | Freq: Every day | ORAL | Status: DC

## 2011-07-17 NOTE — Assessment & Plan Note (Signed)
Blood pressure is well controlled on today's visit. No changes made to the medications. 

## 2011-07-17 NOTE — Patient Instructions (Signed)
You are doing well. No medication changes were made.  Please call us if you have new issues that need to be addressed before your next appt.  Your physician wants you to follow-up in: 6 months.  You will receive a reminder letter in the mail two months in advance. If you don't receive a letter, please call our office to schedule the follow-up appointment.   

## 2011-07-17 NOTE — Assessment & Plan Note (Signed)
We have encouraged continued exercise, careful diet management in an effort to lose weight. 

## 2011-07-17 NOTE — Progress Notes (Signed)
Patient ID: [name] [name], female    DOB: 08-08-1931, 78 y.o.   MRN: [id number]  HPI Comments: 78 y/o woman with h/o morbid obesity, sleep apnea and obesity hypoventilation syndrome, atrial fib maintaining NSR on flecainide, DVT on the left and PE in 2007, diastolic HF on chronic diuretics, nonobstructive CAD by cath 8/10 and small AAA, chronic shortness of breath, edema.     In the past, she would eat out at restaurants frequently and had significant weight gain. She reports that her weight has been relatively stable. She takes metolazone and torsemide daily. She feels her leg edema and breathing is the best now that it has been in some time. She denies any tachycardia or palpitations and feels she is maintaining normal rhythm. No chest pain, lightheadedness.   She has had problems with shingles pain around her right flank. She is on pain medications for this. 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    Outpatient Encounter Prescriptions as of 07/17/2011  Medication Sig Dispense Refill  . aspirin (ASPIR-81) 81 MG EC tablet Take 81 mg by mouth daily.        . cetirizine (ZYRTEC ALLERGY) 10 MG tablet Take 10 mg by mouth daily.        . colchicine (COLCRYS) 0.6 MG tablet Take two tablets by mouth now, in one hour take one tablet by mouth, then one tablet by mouth daily  60 tablet  3  . diltiazem (CARDIZEM CD) 120 MG 24 hr capsule Take 1 capsule (120 mg total) by mouth daily.  30 capsule  6  . flecainide (TAMBOCOR) 100 MG tablet Take 1 tablet (100 mg total) by mouth every 12 (twelve) hours.  60 tablet  6  . metolazone (ZAROXOLYN) 2.5 MG tablet Take 1 tablet (2.5 mg total) by mouth daily.  30 tablet  6  . NON FORMULARY Oxygen 2.5 liters daily      . oxyCODONE (OXYCONTIN) 10 MG 12 hr tablet Take 1 tablet (10 mg total) by mouth  every 12 (twelve) hours as needed for pain.  60 tablet  0  . potassium chloride SA (KLOR-CON M20) 20 MEQ tablet Take 1 tablet (20 mEq total) by mouth daily. Take 2 tabs  30 tablet  6  . torsemide (DEMADEX) 20 MG tablet Take 1 tablet (20 mg total) by mouth daily.  30 tablet  6  . venlafaxine (EFFEXOR XR) 150 MG 24 hr capsule Take 1 capsule (150 mg total) by mouth daily.  30 capsule  11  . warfarin (COUMADIN) 5 MG tablet Take 1 tablet (5 mg total) by mouth daily. Per instructions: 1/2 tab alternating per the sheet with 1 whole tab  30 tablet  6    Review of Systems  Constitutional: Negative.   HENT: Negative.   Eyes: Negative.   Respiratory: Positive for shortness of breath.   Cardiovascular: Positive for leg swelling.  Gastrointestinal: Negative.   Musculoskeletal: Positive for back pain.  Skin: Negative.   Neurological: Negative.   Hematological: Negative.   Psychiatric/Behavioral: Negative.   All other systems reviewed and are negative.    BP 142/76  Pulse 78  Ht 5\' 4"  (1.626 m)  Wt 313 lb (141.976 kg)  BMI 53.73 kg/m2  Physical Exam  Nursing note and vitals reviewed. Constitutional: She is oriented  to person, place, and time. She appears well-developed and well-nourished.       Obese  HENT:  Head: Normocephalic.  Nose: Nose normal.  Mouth/Throat: Oropharynx is clear and moist.  Eyes: Conjunctivae are normal. Pupils are equal, round, and reactive to light.  Neck: Normal range of motion. Neck supple. No JVD present.  Cardiovascular: Normal rate, regular rhythm, S1 normal, S2 normal, normal heart sounds and intact distal pulses.  Exam reveals no gallop and no friction rub.   No murmur heard.      Trace edema around her ankles  Pulmonary/Chest: Effort normal and breath sounds normal. No respiratory distress. She has no wheezes. She has no rales. She exhibits no tenderness.  Abdominal: Soft. Bowel sounds are normal. She exhibits no distension. There is no tenderness.    Musculoskeletal: Normal range of motion. She exhibits no edema and no tenderness.  Lymphadenopathy:    She has no cervical adenopathy.  Neurological: She is alert and oriented to person, place, and time. Coordination normal.  Skin: Skin is warm and dry. No rash noted. No erythema.  Psychiatric: She has a normal mood and affect. Her behavior is normal. Judgment and thought content normal.         Assessment and Plan

## 2011-07-17 NOTE — Assessment & Plan Note (Signed)
Maintaining normal sinus rhythm. Continue current medication regimen

## 2011-07-17 NOTE — Assessment & Plan Note (Signed)
We have suggested she stay on her current diuretic regimen. He also suggested if she feels hydrated with dry skin, dry mouth, she hold her metolazone. We will need to periodically check her renal function. We have encouraged her to increase her potassium intake as her potassium is always borderline low.

## 2011-07-18 ENCOUNTER — Ambulatory Visit (INDEPENDENT_AMBULATORY_CARE_PROVIDER_SITE_OTHER): Payer: Medicare Other | Admitting: Family Medicine

## 2011-07-18 ENCOUNTER — Ambulatory Visit: Payer: Medicare Other | Admitting: Cardiovascular Disease

## 2011-07-18 DIAGNOSIS — I4891 Unspecified atrial fibrillation: Secondary | ICD-10-CM

## 2011-07-18 DIAGNOSIS — Z5181 Encounter for therapeutic drug level monitoring: Secondary | ICD-10-CM

## 2011-07-18 DIAGNOSIS — Z7901 Long term (current) use of anticoagulants: Secondary | ICD-10-CM

## 2011-07-18 NOTE — Patient Instructions (Signed)
Continue current dose, check in 4 weeks  

## 2011-07-29 ENCOUNTER — Other Ambulatory Visit: Payer: Self-pay | Admitting: *Deleted

## 2011-07-29 MED ORDER — POTASSIUM CHLORIDE CRYS ER 20 MEQ PO TBCR: 20.0000 meq | EXTENDED_RELEASE_TABLET | Freq: Two times a day (BID) | ORAL | Status: DC

## 2011-08-08 ENCOUNTER — Ambulatory Visit (INDEPENDENT_AMBULATORY_CARE_PROVIDER_SITE_OTHER): Payer: Medicare Other | Admitting: Internal Medicine

## 2011-08-08 DIAGNOSIS — Z7901 Long term (current) use of anticoagulants: Secondary | ICD-10-CM

## 2011-08-08 DIAGNOSIS — Z5181 Encounter for therapeutic drug level monitoring: Secondary | ICD-10-CM

## 2011-08-08 DIAGNOSIS — I4891 Unspecified atrial fibrillation: Secondary | ICD-10-CM

## 2011-08-08 LAB — POCT INR: INR: 2.5

## 2011-08-08 NOTE — Patient Instructions (Signed)
Continue current dose, check in 4 weeks  

## 2011-08-15 ENCOUNTER — Ambulatory Visit: Payer: Medicare Other

## 2011-08-28 ENCOUNTER — Other Ambulatory Visit: Payer: Self-pay | Admitting: Family Medicine

## 2011-08-28 NOTE — Telephone Encounter (Signed)
Pt is needing refill on Oxycoton and has a question about how she can get it refilled while [place] the beach [place].

## 2011-08-29 ENCOUNTER — Other Ambulatory Visit: Payer: Self-pay

## 2011-08-29 NOTE — Telephone Encounter (Signed)
Pt request written rx Oxycontin 10 mg. Pt last seen 04/11/11 and last refill 07/12/11. When rx is ready for pick up call [phone] or [phone].

## 2011-08-29 NOTE — Telephone Encounter (Signed)
Prior to refill for Oxycontin, would like PCP input.

## 2011-08-30 MED ORDER — OXYCODONE HCL 10 MG PO TB12: 10.0000 mg | ORAL_TABLET | Freq: Two times a day (BID) | ORAL | Status: DC | PRN

## 2011-08-30 NOTE — Telephone Encounter (Signed)
In your inbox.

## 2011-08-30 NOTE — Telephone Encounter (Signed)
Okay for refill... Last refill 3/20111. Please print and have copland sign. Can we mail the may prescription to her? How can we do the MAy refill?

## 2011-08-30 NOTE — Telephone Encounter (Signed)
Please place in my box and I will sign.

## 2011-08-30 NOTE — Telephone Encounter (Signed)
Script placed up front for pick up, patient advised.

## 2011-08-30 NOTE — Telephone Encounter (Signed)
We can not mail rx b/c its controlled. I will get dr. Dayton Martes to refill?

## 2011-09-05 ENCOUNTER — Ambulatory Visit (INDEPENDENT_AMBULATORY_CARE_PROVIDER_SITE_OTHER): Payer: Medicare Other | Admitting: Family Medicine

## 2011-09-05 DIAGNOSIS — I4891 Unspecified atrial fibrillation: Secondary | ICD-10-CM

## 2011-09-05 DIAGNOSIS — Z7901 Long term (current) use of anticoagulants: Secondary | ICD-10-CM

## 2011-09-05 DIAGNOSIS — Z5181 Encounter for therapeutic drug level monitoring: Secondary | ICD-10-CM

## 2011-09-05 LAB — POCT INR: INR: 3.2

## 2011-09-05 NOTE — Patient Instructions (Signed)
Continue current dose, check in 4 weeks  

## 2011-09-20 ENCOUNTER — Other Ambulatory Visit: Payer: Self-pay

## 2011-09-20 NOTE — Telephone Encounter (Signed)
Can patient have refill on Friday or maybe change her to any other pain medication that can be called in

## 2011-09-20 NOTE — Telephone Encounter (Signed)
Pt leaving next week for beach home and will not return until after labor day. Pt wants to know how she will be able to get [place] Oxycodone [place] the beach because pt will not be able to pick up rx every month. Pt states she takes 1 pill twice a day. Pt can be reached at [phone] or cell [phone].Please advise.Pt last seen 04/11/11.

## 2011-09-20 NOTE — Telephone Encounter (Signed)
WE can print out prescriptions monthly and she can have someone pick up and mail it to her.

## 2011-09-20 NOTE — Telephone Encounter (Signed)
Discussed with  P. Engineer, agricultural on how to provide suply for 4 months. She will contact [facility] and let us know what we can legally do. I don't know of any pain med that we could substitue that would not be a controlled substance, possibly tramadol or vicodin. Have  she has tried these in past? We will get back to her on Tuesday with plan.

## 2011-09-20 NOTE — Telephone Encounter (Signed)
Patient wants to know if you can give her more then one month at a time. I told her that we normally only give one month supply. Please advise

## 2011-09-20 NOTE — Telephone Encounter (Signed)
Cannot increase amount per month (pharmacy would balk), controlled substance cannot have refills. I believe it is illegal to post date things... Can only have someopne pick up each month.  let her know I am sorry, but that is what we need to do.

## 2011-09-24 ENCOUNTER — Other Ambulatory Visit: Payer: Self-pay | Admitting: Family Medicine

## 2011-09-24 MED ORDER — OXYCODONE HCL 10 MG PO TB12: 10.0000 mg | ORAL_TABLET | Freq: Two times a day (BID) | ORAL | Status: DC | PRN

## 2011-09-24 NOTE — Telephone Encounter (Signed)
[name] [name] is contacting pharmacist to determine if any other options.

## 2011-09-24 NOTE — Telephone Encounter (Signed)
Have you heard anything else?

## 2011-09-25 NOTE — Telephone Encounter (Signed)
Patient advised rx ready for pick up 

## 2011-09-26 ENCOUNTER — Ambulatory Visit (INDEPENDENT_AMBULATORY_CARE_PROVIDER_SITE_OTHER): Payer: Medicare Other | Admitting: Family Medicine

## 2011-09-26 DIAGNOSIS — Z5181 Encounter for therapeutic drug level monitoring: Secondary | ICD-10-CM

## 2011-09-26 DIAGNOSIS — Z7901 Long term (current) use of anticoagulants: Secondary | ICD-10-CM

## 2011-09-26 DIAGNOSIS — I4891 Unspecified atrial fibrillation: Secondary | ICD-10-CM

## 2011-09-26 LAB — POCT INR: INR: 3.1

## 2011-09-26 NOTE — Patient Instructions (Signed)
Will be out of town for the summer, she will have her INR's checked while out of town. Heather to fax order when Dr Ermalene Searing comes back tomorrow

## 2011-10-02 ENCOUNTER — Telehealth: Payer: Self-pay | Admitting: *Deleted

## 2011-10-02 NOTE — Telephone Encounter (Signed)
We contacted [facility] and they advised ok to give rx with the days date and not to fill until June July aug But, rx could not be post dated

## 2011-10-02 NOTE — Telephone Encounter (Signed)
Received faxed paperwork from pharmacy to be completed for prior authorization on Oxycontin. Paperwork is in your in box to be completed and faxed back.

## 2011-10-03 ENCOUNTER — Ambulatory Visit: Payer: Medicare Other

## 2011-10-03 ENCOUNTER — Other Ambulatory Visit: Payer: Medicare Other

## 2011-10-04 ENCOUNTER — Encounter: Payer: Medicare Other | Admitting: Family Medicine

## 2011-10-08 NOTE — Telephone Encounter (Signed)
Waiting patient to return my call

## 2011-10-08 NOTE — Telephone Encounter (Signed)
In the nurse outbox

## 2011-10-08 NOTE — Telephone Encounter (Signed)
Patient will just pay for for medication

## 2011-10-08 NOTE — Telephone Encounter (Signed)
Have u done this?

## 2011-10-24 LAB — PROTIME-INR

## 2011-11-11 ENCOUNTER — Encounter: Payer: Self-pay | Admitting: Family Medicine

## 2011-11-22 ENCOUNTER — Encounter: Payer: Self-pay | Admitting: Family Medicine

## 2011-11-25 ENCOUNTER — Telehealth: Payer: Self-pay | Admitting: Family Medicine

## 2011-11-25 NOTE — Telephone Encounter (Signed)
Pt has decided to stay at [facility] until the end of October and will need a refill on her Oxycotton to last her until then. Her son will be coming back into town and she wanted him to pick it up for her. She asks that we call her on her cell since she is out of town at [phone].

## 2011-11-26 MED ORDER — OXYCODONE HCL 10 MG PO TB12: 10.0000 mg | ORAL_TABLET | Freq: Two times a day (BID) | ORAL | Status: DC | PRN

## 2011-11-26 NOTE — Telephone Encounter (Signed)
Printed.. To be signed.

## 2011-11-26 NOTE — Telephone Encounter (Signed)
Patient advised rx ready for pick up 

## 2011-12-04 ENCOUNTER — Encounter: Payer: Self-pay | Admitting: Family Medicine

## 2011-12-30 ENCOUNTER — Encounter: Payer: Self-pay | Admitting: Family Medicine

## 2012-01-20 ENCOUNTER — Encounter: Payer: Self-pay | Admitting: Family Medicine

## 2012-01-28 ENCOUNTER — Other Ambulatory Visit: Payer: Medicare Other

## 2012-01-30 ENCOUNTER — Other Ambulatory Visit: Payer: Self-pay

## 2012-01-30 NOTE — Telephone Encounter (Signed)
Refill request from [place] for Torsemide 20 mg. Ok to refill?

## 2012-01-31 ENCOUNTER — Encounter: Payer: Medicare Other | Admitting: Family Medicine

## 2012-01-31 ENCOUNTER — Other Ambulatory Visit: Payer: Self-pay

## 2012-01-31 MED ORDER — TORSEMIDE 20 MG PO TABS: 20.0000 mg | ORAL_TABLET | Freq: Every day | ORAL | Status: DC

## 2012-01-31 MED ORDER — VENLAFAXINE HCL ER 150 MG PO CP24: 150.0000 mg | ORAL_CAPSULE | Freq: Every day | ORAL | Status: DC

## 2012-01-31 NOTE — Telephone Encounter (Signed)
Rx sent to [facility].

## 2012-01-31 NOTE — Telephone Encounter (Signed)
Rx Torsemide 20 mg 11R and Venlafaxine XR 150 mg 11 R sent electronic to [facility].

## 2012-01-31 NOTE — Telephone Encounter (Signed)
Okay to fill torsemide and venlafaxine for 1 year.

## 2012-02-12 ENCOUNTER — Telehealth: Payer: Self-pay | Admitting: Family Medicine

## 2012-02-12 DIAGNOSIS — E7800 Pure hypercholesterolemia, unspecified: Secondary | ICD-10-CM

## 2012-02-12 DIAGNOSIS — M1A9XX0 Chronic gout, unspecified, without tophus (tophi): Secondary | ICD-10-CM | POA: Insufficient documentation

## 2012-02-12 DIAGNOSIS — D72829 Elevated white blood cell count, unspecified: Secondary | ICD-10-CM

## 2012-02-12 DIAGNOSIS — M109 Gout, unspecified: Secondary | ICD-10-CM

## 2012-02-12 NOTE — Telephone Encounter (Signed)
Message copied by Excell Seltzer on Wed Feb 12, 2012 12:50 AM ------      Message from: Alvina Chou      Created: Mon Feb 10, 2012 11:58 AM      Regarding: lab orders for Thursday 10.3.13       Patient is scheduled for CPX labs, please order future labs, Thanks , Camelia Eng

## 2012-02-13 ENCOUNTER — Ambulatory Visit (INDEPENDENT_AMBULATORY_CARE_PROVIDER_SITE_OTHER): Payer: Medicare Other

## 2012-02-13 ENCOUNTER — Ambulatory Visit (INDEPENDENT_AMBULATORY_CARE_PROVIDER_SITE_OTHER): Payer: Medicare Other | Admitting: Family Medicine

## 2012-02-13 DIAGNOSIS — Z23 Encounter for immunization: Secondary | ICD-10-CM

## 2012-02-13 DIAGNOSIS — I4891 Unspecified atrial fibrillation: Secondary | ICD-10-CM

## 2012-02-13 DIAGNOSIS — D72829 Elevated white blood cell count, unspecified: Secondary | ICD-10-CM

## 2012-02-13 DIAGNOSIS — M109 Gout, unspecified: Secondary | ICD-10-CM

## 2012-02-13 DIAGNOSIS — Z7901 Long term (current) use of anticoagulants: Secondary | ICD-10-CM

## 2012-02-13 DIAGNOSIS — E7800 Pure hypercholesterolemia, unspecified: Secondary | ICD-10-CM

## 2012-02-13 DIAGNOSIS — Z5181 Encounter for therapeutic drug level monitoring: Secondary | ICD-10-CM

## 2012-02-13 LAB — CBC WITH DIFFERENTIAL/PLATELET
Basophils Relative: 0.3 % (ref 0.0–3.0)
Eosinophils Absolute: 0.2 10*3/uL (ref 0.0–0.7)
Eosinophils Relative: 1.7 % (ref 0.0–5.0)
Hemoglobin: 15.4 g/dL — ABNORMAL HIGH (ref 12.0–15.0)
Lymphocytes Relative: 11.3 % — ABNORMAL LOW (ref 12.0–46.0)
MCHC: 33.2 g/dL (ref 30.0–36.0)
MCV: 98.8 fl (ref 78.0–100.0)
Neutro Abs: 9.2 10*3/uL — ABNORMAL HIGH (ref 1.4–7.7)
RBC: 4.67 Mil/uL (ref 3.87–5.11)
WBC: 11.3 10*3/uL — ABNORMAL HIGH (ref 4.5–10.5)

## 2012-02-13 LAB — URIC ACID: Uric Acid, Serum: 11.2 mg/dL — ABNORMAL HIGH (ref 2.4–7.0)

## 2012-02-13 LAB — COMPREHENSIVE METABOLIC PANEL
AST: 25 U/L (ref 0–37)
Alkaline Phosphatase: 92 U/L (ref 39–117)
BUN: 22 mg/dL (ref 6–23)
Creatinine, Ser: 1.1 mg/dL (ref 0.4–1.2)
Glucose, Bld: 102 mg/dL — ABNORMAL HIGH (ref 70–99)

## 2012-02-13 LAB — LIPID PANEL
HDL: 40.1 mg/dL (ref >39.00)
LDL Cholesterol: 124 mg/dL — ABNORMAL HIGH (ref 0–99)
Total CHOL/HDL Ratio: 5
Triglycerides: 137 mg/dL (ref 0.0–149.0)

## 2012-02-13 NOTE — Patient Instructions (Signed)
Continue current dose, check in 4 weeks  

## 2012-02-14 ENCOUNTER — Telehealth: Payer: Self-pay | Admitting: Family Medicine

## 2012-02-14 NOTE — Telephone Encounter (Signed)
Message copied by Excell Seltzer on Fri Feb 14, 2012  4:39 PM ------      Message from: Sueanne Margarita      Created: Fri Feb 14, 2012  3:43 PM       Spoke with patient and advised results      She's not taking Allopurinol and not having any gout flare up's

## 2012-02-14 NOTE — Telephone Encounter (Signed)
I would recommend  starting low uric acid diet (please send her low uric acid diet in web links section of EMR, ask me or DEE if you cannot find).   we cannot use allopruinol because she is on coumadin, if uric acid not lower at OV in 03/2012 we will need to start a different medication to prevent gout flares.

## 2012-02-17 NOTE — Telephone Encounter (Addendum)
Left message at home number with patient's husband to call back, voicemail on cell phone has not been set up.

## 2012-02-18 NOTE — Telephone Encounter (Signed)
Patient notified as instructed by telephone. Diet information mailed to patient per Dr. Ermalene Searing.

## 2012-02-20 ENCOUNTER — Telehealth: Payer: Self-pay | Admitting: *Deleted

## 2012-02-20 NOTE — Telephone Encounter (Signed)
FYI Received a call from [place] at Dr Evert Kohl office. Pt is scheduled to have a surgical procedure there on 03/20/12. Pt's INR needs to be in the 2.0-2.5 range for her procedure. Pt is coming for next inr on 03/16/12.

## 2012-02-20 NOTE — Telephone Encounter (Signed)
Noted  

## 2012-03-03 ENCOUNTER — Telehealth: Payer: Self-pay | Admitting: *Deleted

## 2012-03-03 NOTE — Telephone Encounter (Signed)
Use colchicine as directed on bottle to treat.

## 2012-03-03 NOTE — Telephone Encounter (Signed)
Pt is c/o a gout flare up, she states she does have medicine to take for it, but will not take it until you advise it is ok.

## 2012-03-03 NOTE — Telephone Encounter (Signed)
Colcrys is colchicine. Can interact with doxycycline.. When will course of antibiotics be done?

## 2012-03-03 NOTE — Telephone Encounter (Signed)
Patient notified as instructed by telephone. Pt said she has colcrys ; also Dr Orson Aloe has pt taking Doxycycline for a knot on back of pts neck. Is it OK to take Colcrys with antibiotic?

## 2012-03-04 NOTE — Telephone Encounter (Signed)
Patient notified as instructed by telephone.  Patient states that she will finish the Doxycycline Friday. Patient stated that her foot was swollen up last night and she went ahead and took the gout medication and it has helped a lot.

## 2012-03-05 NOTE — Telephone Encounter (Signed)
That is fine since she will only be on antibiotics for short time more. Let pt know.

## 2012-03-05 NOTE — Telephone Encounter (Signed)
Advised patient

## 2012-03-09 ENCOUNTER — Other Ambulatory Visit: Payer: Self-pay | Admitting: *Deleted

## 2012-03-09 MED ORDER — WARFARIN SODIUM 5 MG PO TABS: 5.0000 mg | ORAL_TABLET | Freq: Every day | ORAL | Status: DC

## 2012-03-10 ENCOUNTER — Other Ambulatory Visit: Payer: Self-pay | Admitting: *Deleted

## 2012-03-10 MED ORDER — FLECAINIDE ACETATE 100 MG PO TABS: 100.0000 mg | ORAL_TABLET | Freq: Two times a day (BID) | ORAL | Status: DC

## 2012-03-10 NOTE — Telephone Encounter (Signed)
Pt overdue for 6 month f/u needs to schedule appointment called x2 could not leave message VM not setup. Refilled Flecainide until able to be seen.

## 2012-03-16 ENCOUNTER — Ambulatory Visit: Payer: Medicare Other

## 2012-03-16 ENCOUNTER — Ambulatory Visit (INDEPENDENT_AMBULATORY_CARE_PROVIDER_SITE_OTHER): Payer: Medicare Other | Admitting: Family Medicine

## 2012-03-16 DIAGNOSIS — Z5181 Encounter for therapeutic drug level monitoring: Secondary | ICD-10-CM

## 2012-03-16 DIAGNOSIS — Z7901 Long term (current) use of anticoagulants: Secondary | ICD-10-CM

## 2012-03-16 DIAGNOSIS — I4891 Unspecified atrial fibrillation: Secondary | ICD-10-CM

## 2012-03-16 NOTE — Patient Instructions (Signed)
Hold x 2 days then 2.5 mg check on Thursdat, Nov 7th

## 2012-03-17 ENCOUNTER — Other Ambulatory Visit: Payer: Medicare Other

## 2012-03-19 ENCOUNTER — Ambulatory Visit (INDEPENDENT_AMBULATORY_CARE_PROVIDER_SITE_OTHER): Payer: Medicare Other | Admitting: Family Medicine

## 2012-03-19 ENCOUNTER — Encounter: Payer: Self-pay | Admitting: Family Medicine

## 2012-03-19 ENCOUNTER — Ambulatory Visit: Payer: Medicare Other

## 2012-03-19 VITALS — BP 120/74 | HR 82 | Temp 97.8°F | Ht 64.0 in | Wt 303.1 lb

## 2012-03-19 DIAGNOSIS — I10 Essential (primary) hypertension: Secondary | ICD-10-CM

## 2012-03-19 DIAGNOSIS — Z5181 Encounter for therapeutic drug level monitoring: Secondary | ICD-10-CM

## 2012-03-19 DIAGNOSIS — B0229 Other postherpetic nervous system involvement: Secondary | ICD-10-CM

## 2012-03-19 DIAGNOSIS — M109 Gout, unspecified: Secondary | ICD-10-CM

## 2012-03-19 DIAGNOSIS — Z7901 Long term (current) use of anticoagulants: Secondary | ICD-10-CM

## 2012-03-19 DIAGNOSIS — Z0000 Encounter for general adult medical examination without abnormal findings: Secondary | ICD-10-CM

## 2012-03-19 DIAGNOSIS — I4891 Unspecified atrial fibrillation: Secondary | ICD-10-CM

## 2012-03-19 DIAGNOSIS — E7800 Pure hypercholesterolemia, unspecified: Secondary | ICD-10-CM

## 2012-03-19 DIAGNOSIS — F322 Major depressive disorder, single episode, severe without psychotic features: Secondary | ICD-10-CM

## 2012-03-19 DIAGNOSIS — Z1211 Encounter for screening for malignant neoplasm of colon: Secondary | ICD-10-CM

## 2012-03-19 LAB — POCT INR: INR: 1.9

## 2012-03-19 MED ORDER — OXYCODONE HCL 10 MG PO TB12: 10.0000 mg | ORAL_TABLET | Freq: Two times a day (BID) | ORAL | Status: DC | PRN

## 2012-03-19 NOTE — Progress Notes (Signed)
  Subjective:    Patient ID: [name] [name], female    DOB: 1932/02/26, 78 y.o.   MRN: [id number]  HPI  78 y/o woman with h/o morbid obesity, sleep apnea and obesity hypoventilation syndrome, atrial fib maintaining NSR on flecainide, DVT on the left and PE in 2007, diastolic HF on chronic diuretics, nonobstructive CAD by cath 8/10 and small AAA, chronic shortness of breath, edema.   In the past, she would eat out at restaurants frequently and had significant weight gain. She reports that her weight has been relatively stable. She takes metolazone and torsemide daily. She feels her leg edema and breathing is the best now that it has been in some time. She denies any tachycardia or palpitations and feels she is maintaining normal rhythm. No chest pain, lightheadedness.  She has had problems with shingles pain around her right flank. She is on pain medications for this. 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  Doing well overall. She has been at the beach since 07/2011.[name] Got back 02/2012. Shew is learning to knot.  Wt Readings from Last 3 Encounters:  03/19/12 303 lb 1.9 oz (137.494 kg)  07/17/11 313 lb (141.976 kg)  07/15/11 315 lb 9.6 oz (143.155 kg)  She has lost 12 lbs.. She is more active lately. Cooking lately.   I have personally reviewed the Medicare Annual Wellness questionnaire and have noted 1. The patient's medical and social history 2. Their use of alcohol, tobacco or illicit drugs 3. Their current medications and supplements 4. The patient's functional ability including ADL's, fall risks, home safety risks and hearing or visual             impairment. 5. Diet and physical activities 6. Evidence for depression or mood disorders  The patients weight, height, BMI and visual acuity have been recorded in the chart I have made referrals, counseling and provided education to the patient based review of the above and I have provided the pt with a written personalized  care plan for preventive services.  She continues to require oxygen at night with CPAP and intermittantly during the day.  Her O2 is 85% off oxygfen in office today.   Pain from PHN is fairly well  controlled  On oxycontin BID. Due for refill.   Review of Systems     Objective:   Physical Exam        Assessment & Plan:

## 2012-03-19 NOTE — Progress Notes (Signed)
Subjective:    Patient ID: [name] [name], female    DOB: 07-22-1931, 78 y.o.   MRN: [id number]  HPI 78 y/o woman with h/o morbid obesity, sleep apnea and obesity hypoventilation syndrome, atrial fib maintaining NSR on flecainide, DVT on the left and PE in 2007, diastolic HF on chronic diuretics, nonobstructive CAD by cath 8/10 and small AAA, chronic shortness of breath, edema.   Doing well overall. She has been at the beach since 07/2011.[name] Got back 02/2012. Shew is learning to knit.  Wt Readings from Last 3 Encounters:  03/19/12 303 lb 1.9 oz (137.494 kg)  07/17/11 313 lb (141.976 kg)  07/15/11 315 lb 9.6 oz (143.155 kg)  She has lost 12 lbs.. She is more active lately. Cooking lately.   I have personally reviewed the Medicare Annual Wellness questionnaire and have noted 1. The patient's medical and social history 2. Their use of alcohol, tobacco or illicit drugs 3. Their current medications and supplements 4. The patient's functional ability including ADL's, fall risks, home safety risks and hearing or visual             impairment. 5. Diet and physical activities 6. Evidence for depression or mood disorders  The patients weight, height, BMI and visual acuity have been recorded in the chart I have made referrals, counseling and provided education to the patient based review of the above and I have provided the pt with a written personalized care plan for preventive services.  She continues to require oxygen at night with CPAP and intermittantly during the day.  Her O2 is 85% off oxygfen in office today.   Pain from PHN is fairly well  controlled  On oxycontin BID. Due for refill.   Hypertension:   Using medication without problems or lightheadedness: None Chest pain with exertion:None Edema:None Short of breath:stable Average home BPs:Not checking Other issues:She takes metolazone and torsemide daily. She feels her leg edema and breathing is the best now that it has been in  some time. She denies any tachycardia or palpitations and feels she is maintaining normal rhythm. No chest pain, lightheadedness.   Elevated Cholesterol: At goal LDL <130. Lab Results  Component Value Date   CHOL 191 02/13/2012   HDL 40.10 02/13/2012   LDLCALC 124* 02/13/2012   TRIG 137.0 02/13/2012   CHOLHDL 5 02/13/2012  Diet compliance: healthy habits now improved. Exercise Moving more Other complaints:  Gout, recent flare using colcrys once daily. Has never been on allopurinol before. Uric acid 11.  No current foot pain or redness.  Depression: Stable control on effexor XR 150 mg daily.     Review of Systems  Constitutional: Positive for fatigue. Negative for fever and unexpected weight change.  HENT: Negative for ear pain, congestion, sore throat, sneezing, trouble swallowing and sinus pressure.   Eyes: Negative for pain and itching.  Respiratory: Negative for cough, shortness of breath and wheezing.   Cardiovascular: Negative for chest pain, palpitations and leg swelling.  Gastrointestinal: Negative for nausea, abdominal pain, diarrhea, constipation and blood in stool.  Genitourinary: Negative for dysuria, hematuria, vaginal discharge, difficulty urinating and menstrual problem.  Musculoskeletal: Positive for arthralgias.  Skin: Negative for rash.  Neurological: Negative for syncope, weakness, light-headedness, numbness and headaches.  Psychiatric/Behavioral: Negative for confusion and dysphoric mood. The patient is not nervous/anxious.        Objective:   Physical Exam  Constitutional: Vital signs are normal. She appears well-developed and well-nourished. She is cooperative.  Non-toxic appearance. She  does not appear ill. No distress.       Morbidly obese, slow gait to due obesity  HENT:  Head: Normocephalic.  Right Ear: Hearing, tympanic membrane, external ear and ear canal normal. Tympanic membrane is not erythematous, not retracted and not bulging.  Left Ear: Hearing,  tympanic membrane, external ear and ear canal normal. Tympanic membrane is not erythematous, not retracted and not bulging.  Nose: No mucosal edema or rhinorrhea. Right sinus exhibits no maxillary sinus tenderness and no frontal sinus tenderness. Left sinus exhibits no maxillary sinus tenderness and no frontal sinus tenderness.  Mouth/Throat: Uvula is midline, oropharynx is clear and moist and mucous membranes are normal.  Eyes: Conjunctivae normal, EOM and lids are normal. Pupils are equal, round, and reactive to light. No foreign bodies found.  Neck: Trachea normal and normal range of motion. Neck supple. Carotid bruit is not present. No mass and no thyromegaly present.  Cardiovascular: Normal rate, S1 normal, S2 normal, normal heart sounds, intact distal pulses and normal pulses.  An irregular rhythm present. Exam reveals no gallop and no friction rub.   No murmur heard.      1 plus peripheral edema, but this is really good for her, chronic venous stasis changes in skin in lower legs  Pulmonary/Chest: Effort normal and breath sounds normal. Not tachypneic. No respiratory distress. She has no decreased breath sounds. She has no wheezes. She has no rhonchi. She has no rales.  Abdominal: Soft. Normal appearance and bowel sounds are normal. There is no tenderness.  Neurological: She is alert.  Skin: Skin is warm, dry and intact. No rash noted.  Psychiatric: Her speech is normal and behavior is normal. Judgment and thought content normal. Her mood appears not anxious. Cognition and memory are normal. She does not exhibit a depressed mood.          Assessment & Plan:  The patient's preventative maintenance and recommended screening tests for an annual wellness exam were reviewed in full today. Brought up to date unless services declined.  Counselled on the importance of diet, exercise, and its role in overall health and mortality. The patient's FH and SH was reviewed, including their home life,  tobacco status, and drug and alcohol status.    Vaccine: Uptodate Mammogram: Not interested. Colon: iFOB yearly DVE/pap: not indicated. No family history of uterine or ovarian cancer. Bone density: No interested.

## 2012-03-19 NOTE — Patient Instructions (Addendum)
Stop colcrys. If no pain returns in next few weeks.. Call to start allopurinol to lower uric acid and reduce risk of gout recurrence. If pain returns restart colcrys. Work on lower uric diet. Avoid small fish, shellfish.  Stop at lab for stool test. Follow up in 6 months 30 min OV with fasting labs prior.

## 2012-03-19 NOTE — Patient Instructions (Addendum)
Take 2.5 mg today then resume dose of 5 mg daily except 2.5 mg M/W, recheck 2 weeks

## 2012-03-20 NOTE — Assessment & Plan Note (Signed)
Stop colcrys. If no pain returns in next few weeks.. Call to start allopurinol to lower uric acid and reduce risk of gout recurrence. If pain returns restart colcrys. Work on lower uric diet. Avoid small fish, shellfish.

## 2012-03-20 NOTE — Assessment & Plan Note (Signed)
Rate controlled. Followed by Cards.  On coumadin.

## 2012-03-20 NOTE — Assessment & Plan Note (Signed)
Worsened control from last year but LDL still at goal <130. Encouraged exercise, weight loss, healthy eating habits.

## 2012-03-20 NOTE — Assessment & Plan Note (Signed)
Stable pain control on oxycontin.

## 2012-03-20 NOTE — Assessment & Plan Note (Signed)
Well controlled. Continue current medication.  

## 2012-03-20 NOTE — Assessment & Plan Note (Addendum)
Stable on effexor XR. She is more active, doing more.

## 2012-03-24 ENCOUNTER — Telehealth: Payer: Self-pay

## 2012-03-24 MED ORDER — METOLAZONE 2.5 MG PO TABS: 2.5000 mg | ORAL_TABLET | Freq: Every day | ORAL | Status: DC

## 2012-03-24 NOTE — Telephone Encounter (Signed)
Refill sent for metolazone 2.5 mg

## 2012-03-31 ENCOUNTER — Other Ambulatory Visit: Payer: Self-pay

## 2012-03-31 MED ORDER — POTASSIUM CHLORIDE CRYS ER 20 MEQ PO TBCR: 20.0000 meq | EXTENDED_RELEASE_TABLET | Freq: Two times a day (BID) | ORAL | Status: DC

## 2012-03-31 NOTE — Telephone Encounter (Signed)
Refill sent for potassium 20 meq  

## 2012-04-02 ENCOUNTER — Ambulatory Visit: Payer: Medicare Other

## 2012-04-02 ENCOUNTER — Other Ambulatory Visit (INDEPENDENT_AMBULATORY_CARE_PROVIDER_SITE_OTHER): Payer: Medicare Other

## 2012-04-02 DIAGNOSIS — Z1211 Encounter for screening for malignant neoplasm of colon: Secondary | ICD-10-CM

## 2012-04-03 ENCOUNTER — Ambulatory Visit (INDEPENDENT_AMBULATORY_CARE_PROVIDER_SITE_OTHER): Payer: Medicare Other | Admitting: Family Medicine

## 2012-04-03 DIAGNOSIS — Z5181 Encounter for therapeutic drug level monitoring: Secondary | ICD-10-CM

## 2012-04-03 DIAGNOSIS — I4891 Unspecified atrial fibrillation: Secondary | ICD-10-CM

## 2012-04-03 DIAGNOSIS — Z7901 Long term (current) use of anticoagulants: Secondary | ICD-10-CM

## 2012-04-03 LAB — POCT INR: INR: 2.3

## 2012-04-03 NOTE — Patient Instructions (Addendum)
Continue 5 mg daily except 2.5 mg M/W, recheck 4 weeks

## 2012-04-27 ENCOUNTER — Other Ambulatory Visit: Payer: Self-pay

## 2012-04-27 NOTE — Telephone Encounter (Signed)
Pt request rx oxycodone. Pt will be in office for lab work on 04/28/12 at 3:45 pm and request pick up rx then if possible. Pt also stopped colcrys at 03/19/12 visit as instructed;pt request allopurinol sent to [facility].Please advise.

## 2012-04-28 ENCOUNTER — Ambulatory Visit (INDEPENDENT_AMBULATORY_CARE_PROVIDER_SITE_OTHER): Payer: Medicare Other | Admitting: General Practice

## 2012-04-28 DIAGNOSIS — Z7901 Long term (current) use of anticoagulants: Secondary | ICD-10-CM

## 2012-04-28 DIAGNOSIS — Z5181 Encounter for therapeutic drug level monitoring: Secondary | ICD-10-CM

## 2012-04-28 DIAGNOSIS — I4891 Unspecified atrial fibrillation: Secondary | ICD-10-CM

## 2012-04-28 MED ORDER — OXYCODONE HCL 10 MG PO TB12: 10.0000 mg | ORAL_TABLET | Freq: Two times a day (BID) | ORAL | Status: DC | PRN

## 2012-04-28 MED ORDER — ALLOPURINOL 100 MG PO TABS: 100.0000 mg | ORAL_TABLET | Freq: Every day | ORAL | Status: DC

## 2012-04-28 NOTE — Telephone Encounter (Signed)
We just started it. I am not sure why it is not in EMR. Call pt to determine if she is on 100 mg daily which is most likely.

## 2012-04-28 NOTE — Telephone Encounter (Signed)
Okay to send in allopurinol prescription as well.

## 2012-04-28 NOTE — Telephone Encounter (Signed)
What doseage?

## 2012-05-04 ENCOUNTER — Other Ambulatory Visit: Payer: Self-pay

## 2012-05-04 MED ORDER — DILTIAZEM HCL ER COATED BEADS 120 MG PO CP24: 120.0000 mg | ORAL_CAPSULE | Freq: Every day | ORAL | Status: DC

## 2012-05-04 NOTE — Telephone Encounter (Signed)
Refill sent for dilitiazem

## 2012-05-26 ENCOUNTER — Other Ambulatory Visit: Payer: Self-pay

## 2012-05-26 MED ORDER — OXYCODONE HCL 10 MG PO TB12: 10.0000 mg | ORAL_TABLET | Freq: Two times a day (BID) | ORAL | Status: DC | PRN

## 2012-05-26 NOTE — Telephone Encounter (Signed)
Pt request rx oxycodone. Pt has coumadin clinic appt [facility] on 05/28/12 at 2:30 pm and pt would like to pick up rx at that time.Please advise.

## 2012-05-28 ENCOUNTER — Ambulatory Visit (INDEPENDENT_AMBULATORY_CARE_PROVIDER_SITE_OTHER): Payer: Medicare Other | Admitting: General Practice

## 2012-05-28 DIAGNOSIS — Z7901 Long term (current) use of anticoagulants: Secondary | ICD-10-CM

## 2012-05-28 DIAGNOSIS — I4891 Unspecified atrial fibrillation: Secondary | ICD-10-CM

## 2012-05-28 DIAGNOSIS — Z5181 Encounter for therapeutic drug level monitoring: Secondary | ICD-10-CM

## 2012-06-22 ENCOUNTER — Other Ambulatory Visit: Payer: Self-pay

## 2012-06-24 ENCOUNTER — Other Ambulatory Visit: Payer: Self-pay | Admitting: *Deleted

## 2012-06-24 MED ORDER — FLECAINIDE ACETATE 100 MG PO TABS: 100.0000 mg | ORAL_TABLET | Freq: Two times a day (BID) | ORAL | Status: DC

## 2012-06-24 NOTE — Telephone Encounter (Signed)
Pt overdue for 6 month f/u scheduled appointment to come in 07/09/12. Sent in Flecainide acetate #60 R#1 to [facility].

## 2012-06-25 ENCOUNTER — Ambulatory Visit: Payer: Medicare Other

## 2012-06-26 DIAGNOSIS — I4891 Unspecified atrial fibrillation: Secondary | ICD-10-CM

## 2012-06-26 LAB — CBC WITH DIFFERENTIAL/PLATELET
Eosinophil #: 0.1 10*3/uL (ref 0.0–0.7)
Eosinophil %: 0.5 %
HCT: 39.1 % (ref 35.0–47.0)
Lymphocyte #: 1.6 10*3/uL (ref 1.0–3.6)
MCH: 32.3 pg (ref 26.0–34.0)
MCHC: 33.4 g/dL (ref 32.0–36.0)
MCV: 97 fL (ref 80–100)
Monocyte #: 1 x10 3/mm — ABNORMAL HIGH (ref 0.2–0.9)
Neutrophil #: 17.6 10*3/uL — ABNORMAL HIGH (ref 1.4–6.5)
Platelet: 329 10*3/uL (ref 150–440)
RDW: 15.8 % — ABNORMAL HIGH (ref 11.5–14.5)
WBC: 20.4 10*3/uL — ABNORMAL HIGH (ref 3.6–11.0)

## 2012-06-26 LAB — URINALYSIS, COMPLETE
Bacteria: NONE SEEN
Bilirubin,UR: NEGATIVE
Blood: NEGATIVE
Hyaline Cast: 5
Ketone: NEGATIVE
Ph: 7 (ref 4.5–8.0)
Protein: 25
RBC,UR: NONE SEEN /HPF (ref 0–5)
Specific Gravity: 1.01 (ref 1.003–1.030)
WBC UR: 1 /HPF (ref 0–5)

## 2012-06-26 LAB — PROTIME-INR: Prothrombin Time: 41.8 secs — ABNORMAL HIGH (ref 11.5–14.7)

## 2012-06-26 LAB — COMPREHENSIVE METABOLIC PANEL
Alkaline Phosphatase: 124 U/L (ref 50–136)
Anion Gap: 13 (ref 7–16)
BUN: 23 mg/dL — ABNORMAL HIGH (ref 7–18)
Calcium, Total: 8.5 mg/dL (ref 8.5–10.1)
Chloride: 96 mmol/L — ABNORMAL LOW (ref 98–107)
Co2: 27 mmol/L (ref 21–32)
EGFR (Non-African Amer.): 28 — ABNORMAL LOW
Glucose: 122 mg/dL — ABNORMAL HIGH (ref 65–99)
Osmolality: 277 (ref 275–301)
Potassium: 3.1 mmol/L — ABNORMAL LOW (ref 3.5–5.1)
SGOT(AST): 22 U/L (ref 15–37)
SGPT (ALT): 21 U/L (ref 12–78)

## 2012-06-26 LAB — CK TOTAL AND CKMB (NOT AT ARMC): CK-MB: 3.8 ng/mL — ABNORMAL HIGH (ref 0.5–3.6)

## 2012-06-27 ENCOUNTER — Inpatient Hospital Stay: Payer: Self-pay | Admitting: Surgery

## 2012-06-27 LAB — CBC WITH DIFFERENTIAL/PLATELET
Basophil #: 0 10*3/uL (ref 0.0–0.1)
Basophil %: 0.4 %
Eosinophil %: 1.5 %
Lymphocyte #: 1.1 10*3/uL (ref 1.0–3.6)
Lymphocyte %: 10.4 %
MCH: 32.5 pg (ref 26.0–34.0)
MCV: 96 fL (ref 80–100)
Monocyte #: 0.7 x10 3/mm (ref 0.2–0.9)
Monocyte %: 6.7 %
Neutrophil #: 8.2 10*3/uL — ABNORMAL HIGH (ref 1.4–6.5)
Platelet: 201 10*3/uL (ref 150–440)
RBC: 2.18 10*6/uL — ABNORMAL LOW (ref 3.80–5.20)
RDW: 15.3 % — ABNORMAL HIGH (ref 11.5–14.5)

## 2012-06-27 LAB — COMPREHENSIVE METABOLIC PANEL
Albumin: 2.4 g/dL — ABNORMAL LOW (ref 3.4–5.0)
Alkaline Phosphatase: 84 U/L (ref 50–136)
Anion Gap: 9 (ref 7–16)
BUN: 31 mg/dL — ABNORMAL HIGH (ref 7–18)
Bilirubin,Total: 0.4 mg/dL (ref 0.2–1.0)
Calcium, Total: 7.8 mg/dL — ABNORMAL LOW (ref 8.5–10.1)
Chloride: 101 mmol/L (ref 98–107)
EGFR (Non-African Amer.): 29 — ABNORMAL LOW
Potassium: 3.2 mmol/L — ABNORMAL LOW (ref 3.5–5.1)
SGOT(AST): 19 U/L (ref 15–37)
Sodium: 139 mmol/L (ref 136–145)
Total Protein: 5.7 g/dL — ABNORMAL LOW (ref 6.4–8.2)

## 2012-06-27 LAB — PROTIME-INR: INR: 2.5

## 2012-06-28 LAB — URINALYSIS, COMPLETE
Bilirubin,UR: NEGATIVE
Ketone: NEGATIVE
Ph: 9 (ref 4.5–8.0)
Protein: NEGATIVE
Squamous Epithelial: NONE SEEN
WBC UR: 18 /HPF (ref 0–5)

## 2012-06-28 LAB — CBC WITH DIFFERENTIAL/PLATELET
Basophil %: 0.5 %
Eosinophil #: 0.3 10*3/uL (ref 0.0–0.7)
Eosinophil %: 2 %
HCT: 19.5 % — ABNORMAL LOW (ref 35.0–47.0)
HGB: 6.6 g/dL — ABNORMAL LOW (ref 12.0–16.0)
Lymphocyte #: 0.9 10*3/uL — ABNORMAL LOW (ref 1.0–3.6)
Lymphocyte %: 7.1 %
MCHC: 33.8 g/dL (ref 32.0–36.0)
Monocyte %: 8.9 %
Neutrophil #: 10.5 10*3/uL — ABNORMAL HIGH (ref 1.4–6.5)
Neutrophil %: 81.5 %
RBC: 2.05 10*6/uL — ABNORMAL LOW (ref 3.80–5.20)
RDW: 15.5 % — ABNORMAL HIGH (ref 11.5–14.5)
WBC: 12.9 10*3/uL — ABNORMAL HIGH (ref 3.6–11.0)

## 2012-06-28 LAB — BASIC METABOLIC PANEL
BUN: 19 mg/dL — ABNORMAL HIGH (ref 7–18)
Calcium, Total: 7.9 mg/dL — ABNORMAL LOW (ref 8.5–10.1)
Creatinine: 1.11 mg/dL (ref 0.60–1.30)
EGFR (Non-African Amer.): 48 — ABNORMAL LOW
Osmolality: 279 (ref 275–301)
Potassium: 4 mmol/L (ref 3.5–5.1)

## 2012-06-28 LAB — PROTIME-INR: INR: 2.3

## 2012-06-29 ENCOUNTER — Ambulatory Visit: Payer: Medicare Other

## 2012-06-29 DIAGNOSIS — I4891 Unspecified atrial fibrillation: Secondary | ICD-10-CM

## 2012-06-29 LAB — CBC WITH DIFFERENTIAL/PLATELET
Eosinophil: 3 %
MCHC: 33.4 g/dL (ref 32.0–36.0)
MCV: 95 fL (ref 80–100)
Monocytes: 4 %
Myelocyte: 1 %
Platelet: 235 10*3/uL (ref 150–440)
RBC: 2.26 10*6/uL — ABNORMAL LOW (ref 3.80–5.20)
RDW: 15.8 % — ABNORMAL HIGH (ref 11.5–14.5)
Segmented Neutrophils: 81 %
WBC: 18 10*3/uL — ABNORMAL HIGH (ref 3.6–11.0)

## 2012-06-29 LAB — URINALYSIS, COMPLETE
Bilirubin,UR: NEGATIVE
Ketone: NEGATIVE
Nitrite: NEGATIVE
Ph: 9 (ref 4.5–8.0)
Protein: 100
Specific Gravity: 1.021 (ref 1.003–1.030)
Squamous Epithelial: NONE SEEN
WBC UR: 14 /HPF (ref 0–5)

## 2012-06-29 LAB — PROTIME-INR: INR: 1.5

## 2012-07-04 LAB — CULTURE, BLOOD (SINGLE)

## 2012-07-08 ENCOUNTER — Telehealth: Payer: Self-pay | Admitting: General Practice

## 2012-07-08 NOTE — Telephone Encounter (Signed)
No answer

## 2012-07-09 ENCOUNTER — Ambulatory Visit: Payer: Medicare Other | Admitting: Cardiovascular Disease

## 2012-08-05 ENCOUNTER — Telehealth: Payer: Self-pay | Admitting: Family Medicine

## 2012-08-05 NOTE — Telephone Encounter (Signed)
Call from [facility]:  A Fib on Coumadin, we follow for INR. Had syncope, and had massive facial hematoma, has a wound vac and went to the burn center at [facility] for a while inpatient. Eventually transitioned out and is now in the inpatient rehab unit with wound vac.   Syncope - f/u with Dr. Mariah Milling - some of her cards medications were changed. Arrythmias  [facility] set up f/u with Dr. Mariah Milling, and Pulm.  Labs including INR set to be drawn on Friday, but you should set up with [facility]line Asp / Coumadin to assist. (Dr. C would at least)   Hannah Beat, MD 08/05/2012, 2:00 PM

## 2012-08-06 ENCOUNTER — Ambulatory Visit: Payer: Medicare Other | Admitting: Pulmonary Disease

## 2012-08-06 ENCOUNTER — Telehealth: Payer: Self-pay | Admitting: Family Medicine

## 2012-08-06 DIAGNOSIS — Z5181 Encounter for therapeutic drug level monitoring: Secondary | ICD-10-CM

## 2012-08-06 DIAGNOSIS — I4891 Unspecified atrial fibrillation: Secondary | ICD-10-CM

## 2012-08-06 NOTE — Telephone Encounter (Signed)
Message copied by Excell Seltzer on Thu Aug 06, 2012  2:03 PM ------      Message from: Consuello Masse      Created: Thu Aug 06, 2012 11:28 AM                   ----- Message -----         From: Garrison Columbus, RN         Sent: 08/06/2012  11:23 AM           To: Consuello Masse, CMA            Just add order for pt/inr to be drawn in lab and I will call her with results and dosing instruction.            Thanks,      Arline Asp ------

## 2012-08-06 NOTE — Telephone Encounter (Signed)
I agree.. Have her have INR done in [facility] please.  Does she have follow up set with me?

## 2012-08-06 NOTE — Telephone Encounter (Signed)
appt with dr b on 4-1  [facility] do you set up inr

## 2012-08-06 NOTE — Telephone Encounter (Signed)
hhrn will check inr and call us with results

## 2012-08-06 NOTE — Telephone Encounter (Signed)
I put in order.. Let pt know to make lab appt or have HHRN draw.

## 2012-08-07 ENCOUNTER — Ambulatory Visit (INDEPENDENT_AMBULATORY_CARE_PROVIDER_SITE_OTHER): Payer: Medicare Other | Admitting: General Practice

## 2012-08-07 LAB — POCT INR: INR: 2.4

## 2012-08-11 ENCOUNTER — Ambulatory Visit (INDEPENDENT_AMBULATORY_CARE_PROVIDER_SITE_OTHER): Payer: Medicare Other | Admitting: Family Medicine

## 2012-08-11 ENCOUNTER — Encounter: Payer: Self-pay | Admitting: Family Medicine

## 2012-08-11 VITALS — BP 120/72 | HR 72 | Temp 97.6°F | Ht 64.0 in | Wt 281.0 lb

## 2012-08-11 DIAGNOSIS — S81801D Unspecified open wound, right lower leg, subsequent encounter: Secondary | ICD-10-CM

## 2012-08-11 DIAGNOSIS — Z5189 Encounter for other specified aftercare: Secondary | ICD-10-CM

## 2012-08-11 DIAGNOSIS — I10 Essential (primary) hypertension: Secondary | ICD-10-CM

## 2012-08-11 DIAGNOSIS — I5032 Chronic diastolic (congestive) heart failure: Secondary | ICD-10-CM

## 2012-08-11 DIAGNOSIS — I4891 Unspecified atrial fibrillation: Secondary | ICD-10-CM

## 2012-08-11 MED ORDER — OXYCODONE HCL 10 MG PO TB12: 10.0000 mg | ORAL_TABLET | Freq: Two times a day (BID) | ORAL | Status: DC | PRN

## 2012-08-11 NOTE — Patient Instructions (Addendum)
Stay on current dose coumadin. Keep INR scheduled for next Friday.  Stop at lab on your way out.  Follow up in 3 weeks.

## 2012-08-11 NOTE — Progress Notes (Signed)
  Subjective:    Patient ID: [name] [name], female    DOB: 07/31/1931, 79 y.o.   MRN: [id number]  HPI 84 year olf female with history of afib on warfarin, CHF who had a syncopal episode on 2/14 with LOC and fall. Following her fall she went to [facility]..INR/Prothrombin Time was supratheraputic at 4.4/ She required transfusion of 2 UNits PRBC. For an extensive hematoma .  it was felt she likely had a symptomatic episode of Afib with RVR...given diltiazem with rate control.   She was transferred to [facility] on 3/17 for consideration of skin graft. She was deemed to be a poor surgical candidate for skin care graft. She had wound vac placed 3/7.  She was then transferred to rehab.   During hospitalization she also had proteus UTI secondary to indwelling foley, 3/23 started on cefuroxime 250mg   BID.  Last day of 10 day course today.  She reports no further dysuria or frequency.  She is following up at [facility] burn center for wound care. Next folow up on 4/10.  Pain controlled on oxycodone 10 mg po using  About once every other day as needed.  Coumadin was changed to 5 mg Mon-Sat, 2.5 mg on Sun... Last INR 3/26 was 2.3 INR 3/28 2.4.   Afib: continued on home meds including flecanide, cardiazem CD 120 mg daily furosemide 40 and metalazone 2.5. Repeat ECHO on 2/19 showed nml EF.  Ziopatch was placed on 3/17  She has follow up with cardiology here on 4/3.  Since home BPs stable, HR stable.  Follow up with Pulm Dr.Sood:  5/23. For yearly check  Hypoventilation/hypoxemia... On chronic  24 hour oxygen now since hospitalization.  Stable SOB per pt.  Getting HH PT now. Using walker and wheelchair.       Review of Systems  Constitutional: Negative for fever.  HENT: Negative for ear pain.   Eyes: Negative for pain.  Respiratory: Negative for chest tightness and shortness of breath.   Cardiovascular: Negative for chest pain, palpitations and leg swelling.  Gastrointestinal: Negative for abdominal pain.   Genitourinary: Negative for dysuria.       Objective:   Physical Exam  Constitutional:  Morbidly obese female wheelchair bound, on oxygen  HENT:  Head: Normocephalic.  Eyes: Conjunctivae and EOM are normal. Pupils are equal, round, and reactive to light.  Neck: Normal range of motion. Neck supple. No thyromegaly present.  Cardiovascular: Normal rate, intact distal pulses and normal pulses.  An irregular rhythm present. Exam reveals distant heart sounds.   No murmur heard. 3 plus edema in right leg, 1 plus in left.  Wound vac on right leg.  Pulmonary/Chest: Effort normal and breath sounds normal. No respiratory distress. She has no decreased breath sounds. She has no wheezes. She has no rhonchi. She has no rales.  Abdominal: Soft. Normal appearance. There is no tenderness. There is no CVA tenderness.          Assessment & Plan:

## 2012-08-12 LAB — COMPREHENSIVE METABOLIC PANEL
ALT: 24 U/L (ref 0–35)
AST: 29 U/L (ref 0–37)
Alkaline Phosphatase: 83 U/L (ref 39–117)
Calcium: 9.2 mg/dL (ref 8.4–10.5)
Chloride: 95 mEq/L — ABNORMAL LOW (ref 96–112)
Creatinine, Ser: 1.4 mg/dL — ABNORMAL HIGH (ref 0.4–1.2)
Potassium: 4.6 mEq/L (ref 3.5–5.1)

## 2012-08-13 ENCOUNTER — Ambulatory Visit: Payer: Medicare Other | Admitting: Cardiovascular Disease

## 2012-08-14 ENCOUNTER — Ambulatory Visit (INDEPENDENT_AMBULATORY_CARE_PROVIDER_SITE_OTHER): Payer: Medicare Other | Admitting: General Practice

## 2012-08-14 ENCOUNTER — Telehealth: Payer: Self-pay

## 2012-08-14 DIAGNOSIS — N390 Urinary tract infection, site not specified: Secondary | ICD-10-CM

## 2012-08-14 DIAGNOSIS — S91009A Unspecified open wound, unspecified ankle, initial encounter: Secondary | ICD-10-CM

## 2012-08-14 DIAGNOSIS — S81009A Unspecified open wound, unspecified knee, initial encounter: Secondary | ICD-10-CM

## 2012-08-14 DIAGNOSIS — I509 Heart failure, unspecified: Secondary | ICD-10-CM

## 2012-08-14 DIAGNOSIS — I5032 Chronic diastolic (congestive) heart failure: Secondary | ICD-10-CM

## 2012-08-14 DIAGNOSIS — S81809A Unspecified open wound, unspecified lower leg, initial encounter: Secondary | ICD-10-CM

## 2012-08-14 LAB — POCT INR: INR: 3.1

## 2012-08-14 NOTE — Telephone Encounter (Signed)
[name] [name] with [facility] left v/m has been doing wound changes 3 x a week and [name] [name] request order for Juven that is a protein supplement;Lynn can provide supplement for pt. Protein supplement for tissue building and helping to heal wounds.Please advise.

## 2012-08-14 NOTE — Telephone Encounter (Signed)
Okay to give protein supplement as requested.  Make sure pt has schedule recheck of BMET in 1 week form last labs in our office.

## 2012-08-14 NOTE — Telephone Encounter (Signed)
Nurse advised and she will draw lab and she to [place]

## 2012-08-16 DIAGNOSIS — S81809A Unspecified open wound, unspecified lower leg, initial encounter: Secondary | ICD-10-CM | POA: Insufficient documentation

## 2012-08-16 NOTE — Assessment & Plan Note (Addendum)
Most of peripheral edema in right leg is from immobility and obesity, venous insufficiency.  Continue lasix and metalozone. Due for eval of electrolytes. She has follow up with cards at [facility] and in [place].

## 2012-08-16 NOTE — Assessment & Plan Note (Signed)
Stable on current meds. Due for INR in 1 week.

## 2012-08-21 ENCOUNTER — Ambulatory Visit (INDEPENDENT_AMBULATORY_CARE_PROVIDER_SITE_OTHER): Payer: Medicare Other | Admitting: General Practice

## 2012-08-24 ENCOUNTER — Encounter: Payer: Self-pay | Admitting: Family Medicine

## 2012-09-01 ENCOUNTER — Other Ambulatory Visit: Payer: Self-pay | Admitting: *Deleted

## 2012-09-01 ENCOUNTER — Ambulatory Visit: Payer: Medicare Other | Admitting: Family Medicine

## 2012-09-01 MED ORDER — POTASSIUM CHLORIDE CRYS ER 10 MEQ PO TBCR: EXTENDED_RELEASE_TABLET | ORAL | Status: DC

## 2012-09-01 MED ORDER — FUROSEMIDE 40 MG PO TABS: 40.0000 mg | ORAL_TABLET | Freq: Every day | ORAL | Status: DC

## 2012-09-01 MED ORDER — ATORVASTATIN CALCIUM 10 MG PO TABS: 10.0000 mg | ORAL_TABLET | Freq: Every day | ORAL | Status: DC

## 2012-09-03 ENCOUNTER — Ambulatory Visit: Payer: Medicare Other | Admitting: Cardiovascular Disease

## 2012-09-04 ENCOUNTER — Ambulatory Visit (INDEPENDENT_AMBULATORY_CARE_PROVIDER_SITE_OTHER): Payer: Self-pay | Admitting: General Practice

## 2012-09-11 ENCOUNTER — Encounter: Payer: Self-pay | Admitting: Nurse Practitioner

## 2012-09-11 ENCOUNTER — Encounter: Payer: Self-pay | Admitting: Cardiothoracic Surgery

## 2012-09-11 ENCOUNTER — Ambulatory Visit (INDEPENDENT_AMBULATORY_CARE_PROVIDER_SITE_OTHER): Payer: Medicare Other | Admitting: General Practice

## 2012-09-15 ENCOUNTER — Ambulatory Visit: Payer: Medicare Other | Admitting: Pulmonary Disease

## 2012-09-18 ENCOUNTER — Ambulatory Visit (INDEPENDENT_AMBULATORY_CARE_PROVIDER_SITE_OTHER): Payer: Medicare Other | Admitting: Pharmacist

## 2012-09-18 DIAGNOSIS — I4891 Unspecified atrial fibrillation: Secondary | ICD-10-CM

## 2012-09-18 DIAGNOSIS — I749 Embolism and thrombosis of unspecified artery: Secondary | ICD-10-CM

## 2012-09-22 ENCOUNTER — Other Ambulatory Visit: Payer: Self-pay

## 2012-09-22 MED ORDER — OXYCODONE HCL 10 MG PO TB12: 10.0000 mg | ORAL_TABLET | Freq: Two times a day (BID) | ORAL | Status: DC | PRN

## 2012-09-22 NOTE — Telephone Encounter (Signed)
Patient advised rx ready for pick up 

## 2012-09-22 NOTE — Telephone Encounter (Signed)
Pt left v/m requesting rx oxycodone. Call when ready for pick up. Pt wants to pick up on 08/24/12.

## 2012-09-23 ENCOUNTER — Telehealth: Payer: Self-pay

## 2012-09-23 NOTE — Telephone Encounter (Signed)
Elmer Bales PT with [facility] left v/m requesting verbal order for one week extension of PT for 2 visits.Please advise.

## 2012-09-23 NOTE — Telephone Encounter (Signed)
Okay to extend PT as requested.

## 2012-09-24 NOTE — Telephone Encounter (Signed)
Left message on voice mail advising therapist.

## 2012-09-25 ENCOUNTER — Ambulatory Visit (INDEPENDENT_AMBULATORY_CARE_PROVIDER_SITE_OTHER): Payer: Medicare Other | Admitting: General Practice

## 2012-09-25 DIAGNOSIS — I4891 Unspecified atrial fibrillation: Secondary | ICD-10-CM

## 2012-09-25 DIAGNOSIS — I749 Embolism and thrombosis of unspecified artery: Secondary | ICD-10-CM

## 2012-09-25 LAB — POCT INR: INR: 4.3

## 2012-09-29 ENCOUNTER — Ambulatory Visit (INDEPENDENT_AMBULATORY_CARE_PROVIDER_SITE_OTHER): Payer: Medicare Other | Admitting: Pulmonary Disease

## 2012-09-29 ENCOUNTER — Encounter: Payer: Self-pay | Admitting: Pulmonary Disease

## 2012-09-29 VITALS — BP 92/60 | HR 80 | Temp 97.4°F | Ht 65.0 in | Wt 277.0 lb

## 2012-09-29 DIAGNOSIS — I509 Heart failure, unspecified: Secondary | ICD-10-CM

## 2012-09-29 DIAGNOSIS — R0609 Other forms of dyspnea: Secondary | ICD-10-CM

## 2012-09-29 DIAGNOSIS — R0600 Dyspnea, unspecified: Secondary | ICD-10-CM

## 2012-09-29 DIAGNOSIS — G4733 Obstructive sleep apnea (adult) (pediatric): Secondary | ICD-10-CM

## 2012-09-29 DIAGNOSIS — R0989 Other specified symptoms and signs involving the circulatory and respiratory systems: Secondary | ICD-10-CM

## 2012-09-29 DIAGNOSIS — J9611 Chronic respiratory failure with hypoxia: Secondary | ICD-10-CM | POA: Insufficient documentation

## 2012-09-29 DIAGNOSIS — R0602 Shortness of breath: Secondary | ICD-10-CM | POA: Insufficient documentation

## 2012-09-29 DIAGNOSIS — J9610 Chronic respiratory failure, unspecified whether with hypoxia or hypercapnia: Secondary | ICD-10-CM

## 2012-09-29 NOTE — Patient Instructions (Signed)
We will send you to pulmonary rehab at [facility] We will send you for a chest Xray at the [facility] office  Use 2.5 L of oxygen at rest, and ____ L with exertion  Keep using your CPAP machine  We will see you back in 4 months or sooner if needed

## 2012-09-29 NOTE — Progress Notes (Signed)
Subjective:    Patient ID: [name] [name], female    DOB: 05-13-1932, 79 y.o.   MRN: [id number]  Synopsis: The Rosemeyer is a 79 year old female who has a past medical history significant for congestive heart failure, pulmonary hypertension, pulmonary embolism, atrial fibrillation and obstructive sleep apnea. She previously been seen by Dr. Coralyn Helling for management of sleep apnea. She uses CPAP at 7 a 10 cm of water every night. At baseline she is on 2.5 L of oxygen continuously.  HPI  This is a 79 year old female comes her clinic today to establish care for shortness of breath as well as ongoing management of her obstructive sleep apnea. She was diagnosed with obstructive sleep apnea several years ago and has been using CPAP on an auto titrating device set at 7-10 cm of water. She uses the machine every night and she says that she sleeps well with that. She feels well rested during the day and has no difficulty with the machine.  However she also tells me that she's been having significant increasing shortness of breath in the last several months. Unfortunately in February of 2014 she fell and had a large hematoma develop in her right leg. She was admitted at [facility]. She had to undergo a surgical procedure under moderate sedation to evacuate the hematoma. Since that point she says that she has had lengthy rehabilitation both as an inpatient and well as at home. She continues to participate with rehabilitation in her house. However despite this she states that she feels much more weak and short of breath than prior.  She does not have significant cough and she says that her leg swelling is stable. She tries to watch her sodium and fluid intake and that she has no salt her foods and she only eats was cut to her home. She does not have significant chest pain, fevers, chills.  In the last several months she has noted that her oxygen level dropped down into the 80s while walking onto a half liters of  oxygen. She uses 2 and half liters of oxygen continuously and states that she ever takes it off she starts to feel quite poorly.  She has never smoked cigarettes.  Past Medical History  Diagnosis Date  . Congestive heart failure, unspecified     a. ECHO 6/0: EF 55-60%, mild LVH, mildly dilated RV w. mildly dec fx, RVSP 67  . Chronic atrial fibrillation     failed cardioversion in past  -repeat DC-CV on October 5th, 2010  . Personal history of DVT (deep vein thrombosis) 2008  . Obstructive sleep apnea   . HTN (hypertension)   . Morbid obesity   . Shingles     with postherpetic neuraigia  . GERD (gastroesophageal reflux disease)   . Hyperlipidemia   . Meningitis 1950s  . Gallstones     s/p cholecystectomy  . CAD (coronary artery disease)     nonobstructive by cath 8/10 - LAD 40-50% w mild PAH mean 29 w PVR 3.2 [place]  . AAA (abdominal aortic aneurysm)     small  . PE (pulmonary embolism)     history of     Family History  Problem Relation Age of Onset  . Heart failure Mother   . Depression Mother   . Ovarian cancer Paternal Grandmother   . Heart disease Paternal Grandfather   . Obesity Other      History   Social History  . Marital Status: Married    Spouse  Name: N/A    Number of Children: N/A  . Years of Education: N/A   Occupational History  . Not on file.   Social History Main Topics  . Smoking status: Never Smoker   . Smokeless tobacco: Not on file  . Alcohol Use: Yes     Comment: occasional  . Drug Use: No  . Sexually Active: Not on file   Other Topics Concern  . Not on file   Social History Narrative   Widowed. Retired from Teacher, English as a foreign language.     No Known Allergies   Outpatient Prescriptions Prior to Visit  Medication Sig Dispense Refill  . aspirin (ASPIR-81) 81 MG EC tablet Take 81 mg by mouth daily.        [name] atorvastatin (LIPITOR) 10 MG tablet Take 1 tablet (10 mg total) by mouth daily.  30 tablet  6  . ATROVENT HFA 17 MCG/ACT inhaler Inhale 2  puffs into the lungs 4 (four) times daily.       . cetirizine (ZYRTEC ALLERGY) 10 MG tablet Take 10 mg by mouth daily.        [name] diltiazem (CARDIZEM CD) 120 MG 24 hr capsule Take 1 capsule (120 mg total) by mouth daily.  30 capsule  6  . flecainide (TAMBOCOR) 100 MG tablet Take 1 tablet (100 mg total) by mouth every 12 (twelve) hours.  60 tablet  1  . furosemide (LASIX) 40 MG tablet Take 1 tablet (40 mg total) by mouth daily.  30 tablet  6  . metolazone (ZAROXOLYN) 2.5 MG tablet Take 1 tablet (2.5 mg total) by mouth daily.  30 tablet  3  . NON FORMULARY Oxygen 2.5 liters daily      . oxyCODONE (OXYCONTIN) 10 MG 12 hr tablet Take 1 tablet (10 mg total) by mouth every 12 (twelve) hours as needed for pain.  60 tablet  0  . potassium chloride (KLOR-CON M20) 10 MEQ tablet Take three tablets twice daily  180 tablet  6  . venlafaxine XR (EFFEXOR-XR) 150 MG 24 hr capsule Take 1 capsule (150 mg total) by mouth daily.  30 capsule  11  . warfarin (COUMADIN) 5 MG tablet Take 1 tablet (5 mg total) by mouth daily. Take one tablet by mouth daily or as directed  30 tablet  6  . allopurinol (ZYLOPRIM) 100 MG tablet Take 1 tablet (100 mg total) by mouth daily.  30 tablet  6  . colchicine (COLCRYS) 0.6 MG tablet Take two tablets by mouth now, in one hour take one tablet by mouth, then one tablet by mouth daily  60 tablet  3  . torsemide (DEMADEX) 20 MG tablet Take 1 tablet (20 mg total) by mouth daily.  30 tablet  11   No facility-administered medications prior to visit.      Review of Systems  Constitutional: Positive for fatigue. Negative for fever, chills, diaphoresis and appetite change.  HENT: Negative for hearing loss, nosebleeds, congestion, sore throat, rhinorrhea, trouble swallowing, neck stiffness, postnasal drip and sinus pressure.   Eyes: Negative for discharge, redness and visual disturbance.  Respiratory: Positive for shortness of breath. Negative for cough, choking, chest tightness and wheezing.    Cardiovascular: Positive for leg swelling. Negative for chest pain.  Gastrointestinal: Negative for nausea, abdominal pain, diarrhea, constipation and blood in stool.  Genitourinary: Negative for dysuria, frequency and hematuria.  Musculoskeletal: Positive for arthralgias. Negative for myalgias and joint swelling.  Skin: Negative for color change, pallor and rash.  Neurological:  Negative for dizziness, seizures, facial asymmetry, speech difficulty, light-headedness, numbness and headaches.  Hematological: Negative for adenopathy. Does not bruise/bleed easily.       Objective:   Physical Exam  Filed Vitals:   09/29/12 1518  BP: 92/60  Pulse: 80  Temp: 97.4 F (36.3 C)  TempSrc: Oral  Height: 5\' 5"  (1.651 m)  Weight: 277 lb (125.646 kg)  SpO2: 95%  2.5L [place] Oxygen saturation dropped to 87% on 2.5 L after walking 40 feet, corrected with 4L [place]  Gen: chronically ill appearing, no acute distress, obese HEENT: NCAT, PERRL, EOMi, OP clear, neck supple without masses PULM: CTA B CV: Irreg irreg, no mgr, no JVD AB: BS+, soft, nontender, no hsm Ext: warm, massive pitting edema in legs, no clubbing, no cyanosis Derm: no rash or skin breakdown Neuro: A&Ox4, CN II-XII intact, strength 5/5 in all 4 extremities     Assessment & Plan:   OBSTRUCTIVE SLEEP APNEA [name] [name] continues to use and benefit from auto CPAP set at 7-10 cm of water.  Plan: -Continue CPAP on current settings -She is going to take her machine to the vendor and asked for a download to be given to [place].  Shortness of breath The causes of shortness of breath for [name] [name] are myriad. They include but are not limited to obesity, deconditioning, CHF, restrictive lung disease due to obesity, and pulmonary hypertension.  Her functional status is very poor. At this point, the best approach is to continue close monitoring of her heart failure, keeping her sodium and fluid intake appropriate, and to increase her activity  level.  I see little utility in performing a further workup such as full pulmonary function testing or evaluation of pulmonary hypertension as there is been no therapy indicated for her pulmonary hypertension at this point. If her functional status improved dramatically and she was able to lose weight then we could consider evaluation of possible chronic thromboembolic pulmonary hypertension.  Plan: -Continue followup with the [facility] cardiology -Start pulmonary rehabilitation  Chronic hypoxemic respiratory failure Today after measuring her oxygenation with ambulation we advised her to 2.5 L at rest, 4 L with exertion.   Updated Medication List Outpatient Encounter Prescriptions as of 09/29/2012  Medication Sig Dispense Refill  . Ascorbic Acid (VITAMIN C PO) Take 1 tablet by mouth daily.      [name] aspirin (ASPIR-81) 81 MG EC tablet Take 81 mg by mouth daily.        [name] atorvastatin (LIPITOR) 10 MG tablet Take 1 tablet (10 mg total) by mouth daily.  30 tablet  6  . ATROVENT HFA 17 MCG/ACT inhaler Inhale 2 puffs into the lungs 4 (four) times daily.       . cetirizine (ZYRTEC ALLERGY) 10 MG tablet Take 10 mg by mouth daily.        [name] diltiazem (CARDIZEM CD) 120 MG 24 hr capsule Take 1 capsule (120 mg total) by mouth daily.  30 capsule  6  . flecainide (TAMBOCOR) 100 MG tablet Take 1 tablet (100 mg total) by mouth every 12 (twelve) hours.  60 tablet  1  . furosemide (LASIX) 40 MG tablet Take 1 tablet (40 mg total) by mouth daily.  30 tablet  6  . metolazone (ZAROXOLYN) 2.5 MG tablet Take 1 tablet (2.5 mg total) by mouth daily.  30 tablet  3  . Multiple Vitamin (MULTIVITAMIN) capsule Take 1 capsule by mouth daily.      . Multiple Vitamins-Minerals (ZINC PO) Take 1  tablet by mouth daily.      . NON FORMULARY Oxygen 2.5 liters daily      . oxyCODONE (OXYCONTIN) 10 MG 12 hr tablet Take 1 tablet (10 mg total) by mouth every 12 (twelve) hours as needed for pain.  60 tablet  0  . potassium  chloride (KLOR-CON M20) 10 MEQ tablet Take three tablets twice daily  180 tablet  6  . venlafaxine XR (EFFEXOR-XR) 150 MG 24 hr capsule Take 1 capsule (150 mg total) by mouth daily.  30 capsule  11  . warfarin (COUMADIN) 5 MG tablet Take 1 tablet (5 mg total) by mouth daily. Take one tablet by mouth daily or as directed  30 tablet  6  . [DISCONTINUED] allopurinol (ZYLOPRIM) 100 MG tablet Take 1 tablet (100 mg total) by mouth daily.  30 tablet  6  . [DISCONTINUED] colchicine (COLCRYS) 0.6 MG tablet Take two tablets by mouth now, in one hour take one tablet by mouth, then one tablet by mouth daily  60 tablet  3  . [DISCONTINUED] torsemide (DEMADEX) 20 MG tablet Take 1 tablet (20 mg total) by mouth daily.  30 tablet  11   No facility-administered encounter medications on file as of 09/29/2012.

## 2012-09-29 NOTE — Assessment & Plan Note (Signed)
The causes of shortness of breath for [name] [name] are myriad. They include but are not limited to obesity, deconditioning, CHF, restrictive lung disease due to obesity, and pulmonary hypertension.  Her functional status is very poor. At this point, the best approach is to continue close monitoring of her heart failure, keeping her sodium and fluid intake appropriate, and to increase her activity level.  I see little utility in performing a further workup such as full pulmonary function testing or evaluation of pulmonary hypertension as there is been no therapy indicated for her pulmonary hypertension at this point. If her functional status improved dramatically and she was able to lose weight then we could consider evaluation of possible chronic thromboembolic pulmonary hypertension.  Plan: -Continue followup with the [facility] cardiology -Start pulmonary rehabilitation

## 2012-09-29 NOTE — Assessment & Plan Note (Signed)
Today after measuring her oxygenation with ambulation we advised her to 2.5 L at rest, 4 L with exertion.

## 2012-09-29 NOTE — Assessment & Plan Note (Signed)
[name] [name] continues to use and benefit from auto CPAP set at 7-10 cm of water.  Plan: -Continue CPAP on current settings -She is going to take her machine to the vendor and asked for a download to be given to [place].

## 2012-10-01 ENCOUNTER — Ambulatory Visit: Payer: Medicare Other | Admitting: Cardiovascular Disease

## 2012-10-02 ENCOUNTER — Ambulatory Visit (INDEPENDENT_AMBULATORY_CARE_PROVIDER_SITE_OTHER): Payer: Medicare Other | Admitting: General Practice

## 2012-10-02 DIAGNOSIS — I4891 Unspecified atrial fibrillation: Secondary | ICD-10-CM

## 2012-10-02 DIAGNOSIS — I749 Embolism and thrombosis of unspecified artery: Secondary | ICD-10-CM

## 2012-10-02 LAB — POCT INR: INR: 3.2

## 2012-10-09 ENCOUNTER — Ambulatory Visit (INDEPENDENT_AMBULATORY_CARE_PROVIDER_SITE_OTHER): Payer: Medicare Other | Admitting: General Practice

## 2012-10-09 DIAGNOSIS — I4891 Unspecified atrial fibrillation: Secondary | ICD-10-CM

## 2012-10-09 DIAGNOSIS — I749 Embolism and thrombosis of unspecified artery: Secondary | ICD-10-CM

## 2012-10-09 LAB — POCT INR: INR: 2.6

## 2012-10-11 ENCOUNTER — Encounter: Payer: Self-pay | Admitting: Nurse Practitioner

## 2012-10-11 ENCOUNTER — Encounter: Payer: Self-pay | Admitting: Cardiothoracic Surgery

## 2012-10-12 DIAGNOSIS — S91009A Unspecified open wound, unspecified ankle, initial encounter: Secondary | ICD-10-CM

## 2012-10-12 DIAGNOSIS — I509 Heart failure, unspecified: Secondary | ICD-10-CM

## 2012-10-12 DIAGNOSIS — S81809A Unspecified open wound, unspecified lower leg, initial encounter: Secondary | ICD-10-CM

## 2012-10-12 DIAGNOSIS — I5032 Chronic diastolic (congestive) heart failure: Secondary | ICD-10-CM

## 2012-10-12 DIAGNOSIS — N390 Urinary tract infection, site not specified: Secondary | ICD-10-CM

## 2012-10-12 DIAGNOSIS — S81009A Unspecified open wound, unspecified knee, initial encounter: Secondary | ICD-10-CM

## 2012-10-12 DIAGNOSIS — S272XXA Traumatic hemopneumothorax, initial encounter: Secondary | ICD-10-CM

## 2012-10-19 ENCOUNTER — Ambulatory Visit (INDEPENDENT_AMBULATORY_CARE_PROVIDER_SITE_OTHER): Payer: Medicare Other | Admitting: Cardiovascular Disease

## 2012-10-19 ENCOUNTER — Encounter: Payer: Self-pay | Admitting: Cardiovascular Disease

## 2012-10-19 VITALS — BP 125/79 | HR 82 | Ht 64.0 in | Wt 277.2 lb

## 2012-10-19 DIAGNOSIS — Z5189 Encounter for other specified aftercare: Secondary | ICD-10-CM

## 2012-10-19 DIAGNOSIS — S81801D Unspecified open wound, right lower leg, subsequent encounter: Secondary | ICD-10-CM

## 2012-10-19 DIAGNOSIS — I4891 Unspecified atrial fibrillation: Secondary | ICD-10-CM

## 2012-10-19 DIAGNOSIS — I10 Essential (primary) hypertension: Secondary | ICD-10-CM

## 2012-10-19 DIAGNOSIS — R079 Chest pain, unspecified: Secondary | ICD-10-CM

## 2012-10-19 DIAGNOSIS — R0602 Shortness of breath: Secondary | ICD-10-CM

## 2012-10-19 DIAGNOSIS — I5032 Chronic diastolic (congestive) heart failure: Secondary | ICD-10-CM

## 2012-10-19 MED ORDER — FUROSEMIDE 40 MG PO TABS: 40.0000 mg | ORAL_TABLET | Freq: Two times a day (BID) | ORAL | Status: DC | PRN

## 2012-10-19 NOTE — Patient Instructions (Addendum)
You are doing well. No medication changes were made.  Please monitor your blood pressure If it runs low, call the office Hold your diuretics that day  Please call us if you have new issues that need to be addressed before your next appt.  Your physician wants you to follow-up in: 6 months.  You will receive a reminder letter in the mail two months in advance. If you don't receive a letter, please call our office to schedule the follow-up appointment

## 2012-10-19 NOTE — Assessment & Plan Note (Addendum)
Maintaining normal sinus rhythm. Given recent echocardiogram findings, normal EF, widely dilated left atrium with no valve disease, could potentially come off warfarin. We'll discuss this with her in followup and continue warfarin as she is taking for now. Any further falls, would hold warfarin.

## 2012-10-19 NOTE — Assessment & Plan Note (Signed)
Right leg and a wrap, monitored by wound clinic. Large region of debrided skin after hematoma following a fall.

## 2012-10-19 NOTE — Assessment & Plan Note (Signed)
She's currently on Lasix with metolazone. I suggested she hold her diuretics for any low weight, low blood pressure.

## 2012-10-19 NOTE — Progress Notes (Signed)
Patient ID: HANI PATNODE, female    DOB: Apr 19, 1932, 79 y.o.   MRN: [id number]  HPI Comments: 79 y/o woman with h/o morbid obesity, sleep apnea and obesity hypoventilation syndrome, atrial fib maintaining NSR on flecainide, DVT on the left and PE in 2007, diastolic HF on chronic diuretics, nonobstructive CAD by cath 8/10 and small AAA, chronic shortness of breath, edema.     Since we have last seen her, she had an episode of syncope/loss of consciousness in February 2014 on Valentine's Day. Creatinine at that time was 1.7 H. was well above her baseline of 1.1. She was given IV fluids, secondary to a huge hematoma of her right leg, she has had months of wound evaluation. Not a candidate for skin grafting. She had rehabilitation at [facility], currently living at home now. She has a leg wrap on the right. No swelling of her left lower extremity, only the right. Region of skin loss on the right is extensive. Initial INR on admission in February 2014 was 4.4. Multiple admissions at [facility] for excisional debridement of the wound. Wound VAC was placed. We have discharge 07/27/2012  Echocardiogram in the hospital 06/27/2012 was essentially normal, mildly dilated left atrium  EKG shows normal sinus rhythm with rate of 82 beats per minute, sinus arrhythmia, poor R-wave progression through the precordial leads, left axis deviation.   Outpatient Encounter Prescriptions as of 10/19/2012  Medication Sig Dispense Refill  . Ascorbic Acid (VITAMIN C PO) Take 1 tablet by mouth daily.      [name] aspirin (ASPIR-81) 81 MG EC tablet Take 81 mg by mouth daily.        [name] atorvastatin (LIPITOR) 10 MG tablet Take 1 tablet (10 mg total) by mouth daily.  30 tablet  6  . ATROVENT HFA 17 MCG/ACT inhaler Inhale 2 puffs into the lungs 4 (four) times daily.       . cetirizine (ZYRTEC ALLERGY) 10 MG tablet Take 10 mg by mouth daily.        [name] diltiazem (CARDIZEM CD) 120 MG 24 hr capsule Take 1 capsule (120 mg total) by mouth daily.  30  capsule  6  . flecainide (TAMBOCOR) 100 MG tablet Take 1 tablet (100 mg total) by mouth every 12 (twelve) hours.  60 tablet  1  . furosemide (LASIX) 40 MG tablet Take 1 tablet (40 mg total) by mouth 2 (two) times daily as needed.  60 tablet  6  . metolazone (ZAROXOLYN) 2.5 MG tablet Take 1 tablet (2.5 mg total) by mouth daily.  30 tablet  3  . Multiple Vitamin (MULTIVITAMIN) capsule Take 1 capsule by mouth daily.      . Multiple Vitamins-Minerals (ZINC PO) Take 1 tablet by mouth daily.      . NON FORMULARY Oxygen 2.5 liters daily      . oxyCODONE (OXYCONTIN) 10 MG 12 hr tablet Take 1 tablet (10 mg total) by mouth every 12 (twelve) hours as needed for pain.  60 tablet  0  . potassium chloride (KLOR-CON M20) 10 MEQ tablet Take three tablets twice daily  180 tablet  6  . venlafaxine XR (EFFEXOR-XR) 150 MG 24 hr capsule Take 1 capsule (150 mg total) by mouth daily.  30 capsule  11  . warfarin (COUMADIN) 2.5 MG tablet Take 2.5 mg by mouth as directed.      . warfarin (COUMADIN) 5 MG tablet Take 1 tablet (5 mg total) by mouth daily. Take one tablet by mouth daily  or as directed  30 tablet  6   Review of Systems  Constitutional: Negative.   HENT: Negative.   Eyes: Negative.   Cardiovascular: Positive for leg swelling.  Gastrointestinal: Negative.   Musculoskeletal: Positive for back pain.  Skin: Negative.   Neurological: Negative.   Psychiatric/Behavioral: Negative.   All other systems reviewed and are negative.    BP 125/79  Pulse 82  Ht 5\' 4"  (1.626 m)  Wt 277 lb 4 oz (125.76 kg)  BMI 47.57 kg/m2  Physical Exam  Nursing note and vitals reviewed. Constitutional: She is oriented to person, place, and time. She appears well-developed and well-nourished.  Obese  HENT:  Head: Normocephalic.  Nose: Nose normal.  Mouth/Throat: Oropharynx is clear and moist.  Eyes: Conjunctivae are normal. Pupils are equal, round, and reactive to light.  Neck: Normal range of motion. Neck supple. No JVD  present.  Cardiovascular: Normal rate, regular rhythm, S1 normal, S2 normal, normal heart sounds and intact distal pulses.  Exam reveals no gallop and no friction rub.   No murmur heard. Trace edema around her ankles  Pulmonary/Chest: Effort normal and breath sounds normal. No respiratory distress. She has no wheezes. She has no rales. She exhibits no tenderness.  Abdominal: Soft. Bowel sounds are normal. She exhibits no distension. There is no tenderness.  Musculoskeletal: Normal range of motion. She exhibits no edema and no tenderness.  Lymphadenopathy:    She has no cervical adenopathy.  Neurological: She is alert and oriented to person, place, and time. Coordination normal.  Skin: Skin is warm and dry. No rash noted. No erythema.  Leg wrap on the right lower extremity  Psychiatric: She has a normal mood and affect. Her behavior is normal. Judgment and thought content normal.    Assessment and Plan

## 2012-10-19 NOTE — Assessment & Plan Note (Signed)
Blood pressure is well controlled on today's visit. No changes made to the medications. 

## 2012-10-23 ENCOUNTER — Ambulatory Visit (INDEPENDENT_AMBULATORY_CARE_PROVIDER_SITE_OTHER): Payer: Medicare Other | Admitting: Family Medicine

## 2012-10-23 DIAGNOSIS — I749 Embolism and thrombosis of unspecified artery: Secondary | ICD-10-CM

## 2012-10-23 DIAGNOSIS — I4891 Unspecified atrial fibrillation: Secondary | ICD-10-CM

## 2012-10-23 LAB — POCT INR: INR: 2.9

## 2012-10-27 ENCOUNTER — Other Ambulatory Visit: Payer: Self-pay

## 2012-10-27 MED ORDER — OXYCODONE HCL 10 MG PO TB12: 10.0000 mg | ORAL_TABLET | Freq: Two times a day (BID) | ORAL | Status: DC | PRN

## 2012-10-27 NOTE — Telephone Encounter (Signed)
Pt left v/m requesting rx oxycodone. Call when ready for pick up. Pt wants to pick up on 10/28/12.

## 2012-10-27 NOTE — Telephone Encounter (Signed)
Advised patient script is ready for pick up, placed at front desk. 

## 2012-10-28 ENCOUNTER — Encounter: Payer: Self-pay | Admitting: Family Medicine

## 2012-11-10 ENCOUNTER — Encounter: Payer: Self-pay | Admitting: Cardiothoracic Surgery

## 2012-11-10 ENCOUNTER — Encounter: Payer: Self-pay | Admitting: Nurse Practitioner

## 2012-11-16 LAB — WOUND CULTURE

## 2012-11-17 ENCOUNTER — Encounter: Payer: Self-pay | Admitting: Pulmonary Disease

## 2012-11-17 ENCOUNTER — Telehealth: Payer: Self-pay | Admitting: *Deleted

## 2012-11-17 DIAGNOSIS — G4733 Obstructive sleep apnea (adult) (pediatric): Secondary | ICD-10-CM

## 2012-11-17 NOTE — Telephone Encounter (Addendum)
Message copied by Caryl Ada on Tue Nov 17, 2012  3:01 PM ------      Message from: Max Fickle B      Created: Tue Nov 17, 2012  1:50 PM       L,            Please let her know that her CPAP download looked good.  She is currently set on Autotitrate 5-20, but we should change that to 7-12cmH20.            Thanks,      B ------

## 2012-11-17 NOTE — Telephone Encounter (Signed)
Pt is aware of download results. Order will be placed to change the pressure.

## 2012-11-18 ENCOUNTER — Other Ambulatory Visit: Payer: Self-pay | Admitting: *Deleted

## 2012-11-18 MED ORDER — FLECAINIDE ACETATE 100 MG PO TABS: 100.0000 mg | ORAL_TABLET | Freq: Two times a day (BID) | ORAL | Status: DC

## 2012-11-18 NOTE — Telephone Encounter (Signed)
Refilled Flecainide sent to [facility].

## 2012-11-20 ENCOUNTER — Ambulatory Visit (INDEPENDENT_AMBULATORY_CARE_PROVIDER_SITE_OTHER): Payer: Medicare Other | Admitting: General Practice

## 2012-11-20 DIAGNOSIS — I749 Embolism and thrombosis of unspecified artery: Secondary | ICD-10-CM

## 2012-11-20 DIAGNOSIS — I4891 Unspecified atrial fibrillation: Secondary | ICD-10-CM

## 2012-11-24 ENCOUNTER — Ambulatory Visit (INDEPENDENT_AMBULATORY_CARE_PROVIDER_SITE_OTHER): Payer: Medicare Other | Admitting: Family Medicine

## 2012-11-24 DIAGNOSIS — I4891 Unspecified atrial fibrillation: Secondary | ICD-10-CM

## 2012-11-24 DIAGNOSIS — I749 Embolism and thrombosis of unspecified artery: Secondary | ICD-10-CM

## 2012-12-04 ENCOUNTER — Ambulatory Visit (INDEPENDENT_AMBULATORY_CARE_PROVIDER_SITE_OTHER): Payer: Medicare Other | Admitting: Family Medicine

## 2012-12-04 DIAGNOSIS — I4891 Unspecified atrial fibrillation: Secondary | ICD-10-CM

## 2012-12-04 DIAGNOSIS — I749 Embolism and thrombosis of unspecified artery: Secondary | ICD-10-CM

## 2012-12-04 LAB — POCT INR: INR: 2.3

## 2012-12-05 LAB — WOUND CULTURE

## 2012-12-11 ENCOUNTER — Encounter: Payer: Self-pay | Admitting: Cardiothoracic Surgery

## 2012-12-16 ENCOUNTER — Ambulatory Visit (INDEPENDENT_AMBULATORY_CARE_PROVIDER_SITE_OTHER): Payer: Medicare Other | Admitting: Family Medicine

## 2012-12-16 DIAGNOSIS — I4891 Unspecified atrial fibrillation: Secondary | ICD-10-CM

## 2012-12-16 DIAGNOSIS — I749 Embolism and thrombosis of unspecified artery: Secondary | ICD-10-CM

## 2012-12-30 ENCOUNTER — Telehealth: Payer: Self-pay

## 2012-12-30 NOTE — Telephone Encounter (Signed)
[id number] Operator: Aundra Millet Patient Name: [name] [name] Call Date & Time: 12/29/2012 7:28:42PM Patient Phone: [phone] PCP: Kerby Nora Patient Gender: Female PCP Fax : [phone] Patient DOB: June 21, 1931 Practice Name: [facility] Reason for Call: Today, 12/29/2012, Amy/ Daughter in law calling stating at 1900 this evening pt had Oral temp 103. Pt commented that she had 101 yesterday, but didnt tell anyone. Pt has had intermittent complaints of dysuria since 12/27/2012. pt is currently being treated for leg wound following a fall from 06/2012, but no new complaints or changes associated with the wound. RN reached See in 4 hours for any temperature elevation in a frail elderly per Urinary Symptoms - Female protocol and advised to take pt to [facility] ED now and Amy agreed. Protocol(s) Used: Urinary Symptoms - Female Recommended Outcome per Protocol: See Provider within 4 hours Override Outcome if Used in Protocol: See ED Immediately RN Reason for Override Outcome: Nursing Judgement Used. Reason for Outcome: Any temperature elevation in a frail elderly, immunocompromised or pregnant person Care Advice: ~ Another adult should drive. ~ IMMEDIATE ACTION 08/

## 2013-01-04 ENCOUNTER — Ambulatory Visit (INDEPENDENT_AMBULATORY_CARE_PROVIDER_SITE_OTHER): Payer: Medicare Other | Admitting: Family Medicine

## 2013-01-04 DIAGNOSIS — I749 Embolism and thrombosis of unspecified artery: Secondary | ICD-10-CM

## 2013-01-04 DIAGNOSIS — I4891 Unspecified atrial fibrillation: Secondary | ICD-10-CM

## 2013-01-04 LAB — POCT INR: INR: 1.9

## 2013-01-11 ENCOUNTER — Encounter: Payer: Self-pay | Admitting: Cardiothoracic Surgery

## 2013-01-12 ENCOUNTER — Ambulatory Visit (INDEPENDENT_AMBULATORY_CARE_PROVIDER_SITE_OTHER): Payer: Medicare Other | Admitting: Family Medicine

## 2013-01-12 DIAGNOSIS — I4891 Unspecified atrial fibrillation: Secondary | ICD-10-CM

## 2013-01-12 DIAGNOSIS — I749 Embolism and thrombosis of unspecified artery: Secondary | ICD-10-CM

## 2013-01-12 LAB — POCT INR: INR: 4.8

## 2013-01-14 ENCOUNTER — Ambulatory Visit (INDEPENDENT_AMBULATORY_CARE_PROVIDER_SITE_OTHER): Payer: Medicare Other | Admitting: Family Medicine

## 2013-01-14 DIAGNOSIS — I4891 Unspecified atrial fibrillation: Secondary | ICD-10-CM

## 2013-01-14 DIAGNOSIS — I749 Embolism and thrombosis of unspecified artery: Secondary | ICD-10-CM

## 2013-01-15 ENCOUNTER — Encounter: Payer: Self-pay | Admitting: Family Medicine

## 2013-01-15 ENCOUNTER — Encounter: Payer: Self-pay | Admitting: *Deleted

## 2013-01-15 ENCOUNTER — Ambulatory Visit (INDEPENDENT_AMBULATORY_CARE_PROVIDER_SITE_OTHER): Payer: Medicare Other | Admitting: Family Medicine

## 2013-01-15 ENCOUNTER — Telehealth: Payer: Self-pay | Admitting: Pulmonary Disease

## 2013-01-15 VITALS — BP 110/66 | HR 92 | Temp 97.9°F | Ht 65.0 in | Wt 274.2 lb

## 2013-01-15 DIAGNOSIS — F322 Major depressive disorder, single episode, severe without psychotic features: Secondary | ICD-10-CM

## 2013-01-15 DIAGNOSIS — I4891 Unspecified atrial fibrillation: Secondary | ICD-10-CM

## 2013-01-15 DIAGNOSIS — E7800 Pure hypercholesterolemia, unspecified: Secondary | ICD-10-CM

## 2013-01-15 DIAGNOSIS — S81801D Unspecified open wound, right lower leg, subsequent encounter: Secondary | ICD-10-CM

## 2013-01-15 DIAGNOSIS — Z23 Encounter for immunization: Secondary | ICD-10-CM

## 2013-01-15 DIAGNOSIS — N390 Urinary tract infection, site not specified: Secondary | ICD-10-CM

## 2013-01-15 DIAGNOSIS — I10 Essential (primary) hypertension: Secondary | ICD-10-CM

## 2013-01-15 DIAGNOSIS — Z5189 Encounter for other specified aftercare: Secondary | ICD-10-CM

## 2013-01-15 MED ORDER — OXYCODONE HCL 10 MG PO TB12: 10.0000 mg | ORAL_TABLET | Freq: Two times a day (BID) | ORAL | Status: DC | PRN

## 2013-01-15 NOTE — Progress Notes (Signed)
  Subjective:    Patient ID: [name] [name], female    DOB: 15-Feb-1932, 79 y.o.   MRN: [id number]  HPI 79 y/o woman with h/o morbid obesity, sleep apnea and obesity hypoventilation syndrome, atrial fib maintaining NSR on flecainide, DVT on the left and PE in 2007, diastolic HF on chronic diuretics, nonobstructive CAD by cath 8/10 and small AAA, chronic shortness of breath, edema.   Admitted for UTI at [facility]... Treated with antibiotics. Symptoms resolved now.  Followed by Dr. Mariah Milling... Last seen 10/2012 Hospitalized for hematoma after fall/syncope event, on coumadin earlier in the year. No off wound vac. Followed by wound care center.  Hypertension:  Well controlled  Using medication without problems or lightheadedness: None Chest pain with exertion: none Edema:Yes, improved Short of breath:stable Average home BPs: not checking. Other issues:  Elevated Cholesterol: Due for re-eval. Lab Results  Component Value Date   CHOL 191 02/13/2012   HDL 40.10 02/13/2012   LDLCALC 124* 02/13/2012   TRIG 137.0 02/13/2012   CHOLHDL 5 02/13/2012  Using medications without problems: Muscle aches:  Diet compliance: Exercise: Other complaints:  Depression, stable control at this point on venlafaxine.  Pain from PHN is well controlled on oxycodone.. Only using 3 tabs in last week.   Pulm... Followed by Dr. Kendrick Fries.  Last OV 09/2012 He requested repeat CXR.  Planning pulmonary rehab. She did have CXR at bladder infection hosp admission visit 8/20-8/22 CXR there IMPRESSION: -- Cardiomegaly with diffuse interstitial opacities and mild vascular indistinctness is suggestive of mild pulmonary edema.  -- Mild blunting of the bilateral costophrenic angles could be suggestive of a small amount of pleural fluid. -- Interval improvement of airspace opacity/effusion in the left lung base. Hazy opacity overlying the left lower lung persisits on the frontal projection that does not have a definite correlate on the  lateral projection likely represents atelectasis,  but cannot rule out superimposed infection.          Review of Systems  Constitutional: Negative for fever and fatigue.  HENT: Negative for ear pain.   Eyes: Negative for pain.  Respiratory: Positive for shortness of breath. Negative for chest tightness.        Stable  Cardiovascular: Negative for chest pain, palpitations and leg swelling.  Gastrointestinal: Negative for abdominal pain.  Genitourinary: Negative for dysuria.       Objective:   Physical Exam  Constitutional:  Morbidly obese female wheelchair bound, on oxygen  HENT:  Head: Normocephalic.  Eyes: Conjunctivae and EOM are normal. Pupils are equal, round, and reactive to light.  Neck: Normal range of motion. Neck supple. No thyromegaly present.  Cardiovascular: Normal rate, intact distal pulses and normal pulses.  An irregular rhythm present. Exam reveals distant heart sounds.   No murmur heard. 1+ edema bilaterally, right leg in bandage  Pulmonary/Chest: Effort normal and breath sounds normal. No respiratory distress. She has no decreased breath sounds. She has no wheezes. She has no rhonchi. She has no rales.  Abdominal: Soft. Normal appearance. There is no tenderness. There is no CVA tenderness.  Psychiatric:  cheerful          Assessment & Plan:

## 2013-01-15 NOTE — Patient Instructions (Addendum)
Return for medicare wellness in 6 months for medicare wellness with fasting labs prior. Discuss options other than coumadin at next OV with Dr. Mariah Milling such as xalrelto.

## 2013-01-15 NOTE — Telephone Encounter (Signed)
LM for Lupita Leash at [facility] to call back

## 2013-01-15 NOTE — Telephone Encounter (Signed)
Lupita Leash returned call Spoke with Lupita Leash who reported that at pt's ov today with Dr Ermalene Searing, pt had given Dr Ermalene Searing her previous AVS from 5.20.14 ov with BQ stating that she could get a cxr at the [facility] office.  Per Lupita Leash, pt has had a recent cxr at [facility] (now available to view thru [facility] Every[facility]) if this is sufficient for Dr Kendrick Fries.  Cxr report located, was done on 8.19.14.  I have copied&pasted this into the phone note for BQ to review:  Result Narrative  [id number] [facility]  12/29/12  23:39:28 [id number] [facility]) : XR CHEST PA AND LATERAL  EXAM: Radiologic examination, chest two views (frontal and lateral)  INTERPRETATION LOCATION:  [facility]  DICTATED: Wednesday, December 30, 2012 00:15:38.  CLINICAL INDICATION: 79 year-old F.    - , CHEST PAIN.  TECHNIQUE: PA and lateral views of the chest.  COMPARISON: Portable chest radiograph from 07/13/12  FINDINGS:   An ectatic aorta with aortic arch calcifications is unchanged. The lungs are hypoinflated, causing central bronchovascular crowding and accentuation of the cardiomediastinal silhouette. Cardiomegaly is unchanged. Mild diffuse reticular opacities and  pulmonary vascular indistinctness is seen. Hazy left lower lung opacity is seen on the frontal view that does not have a definite correlate on the lateral view. Mild blunting of the bilateral costophrenic angles is seen. There is no evidence of  pneumothorax. No acute osseous abnormalities are seen.  IMPRESSION:  -- Cardiomegaly with diffuse interstitial opacities and mild vascular indistinctness is suggestive of mild pulmonary edema.   -- Mild blunting of the bilateral costophrenic angles could be suggestive of a small amount of pleural fluid. -- Interval improvement of airspace opacity/effusion in the left lung base. Hazy opacity overlying the left lower lung persisits on the frontal projection that does not have a definite correlate on the lateral projection likely represents atelectasis,   but cannot rule out superimposed infection.

## 2013-01-18 NOTE — Telephone Encounter (Signed)
Please ask if she had follow up after this study since it showed a possible infection in the left lung.  If not, see if she can be seen by me, her PCP, or anyone else this week.

## 2013-01-18 NOTE — Telephone Encounter (Signed)
Pt is scheduled to see BQ tomorrow afternoon at 1:30. Nothing further needed

## 2013-01-19 ENCOUNTER — Ambulatory Visit (INDEPENDENT_AMBULATORY_CARE_PROVIDER_SITE_OTHER)
Admission: RE | Admit: 2013-01-19 | Discharge: 2013-01-19 | Disposition: A | Discharge status: Home / Self Care | Payer: Medicare Other | Source: Ambulatory Visit | Attending: Pulmonary Disease | Admitting: Pulmonary Disease

## 2013-01-19 ENCOUNTER — Ambulatory Visit: Payer: Medicare Other | Admitting: Pulmonary Disease

## 2013-01-19 ENCOUNTER — Ambulatory Visit (INDEPENDENT_AMBULATORY_CARE_PROVIDER_SITE_OTHER): Payer: Medicare Other | Admitting: Pulmonary Disease

## 2013-01-19 ENCOUNTER — Encounter: Payer: Self-pay | Admitting: Pulmonary Disease

## 2013-01-19 VITALS — BP 138/82 | HR 74 | Ht 64.0 in | Wt 278.0 lb

## 2013-01-19 DIAGNOSIS — J9610 Chronic respiratory failure, unspecified whether with hypoxia or hypercapnia: Secondary | ICD-10-CM

## 2013-01-19 DIAGNOSIS — I2699 Other pulmonary embolism without acute cor pulmonale: Secondary | ICD-10-CM

## 2013-01-19 DIAGNOSIS — J189 Pneumonia, unspecified organism: Secondary | ICD-10-CM

## 2013-01-19 DIAGNOSIS — I5032 Chronic diastolic (congestive) heart failure: Secondary | ICD-10-CM

## 2013-01-19 DIAGNOSIS — G4733 Obstructive sleep apnea (adult) (pediatric): Secondary | ICD-10-CM

## 2013-01-19 DIAGNOSIS — J9611 Chronic respiratory failure with hypoxia: Secondary | ICD-10-CM

## 2013-01-19 DIAGNOSIS — Z5189 Encounter for other specified aftercare: Secondary | ICD-10-CM

## 2013-01-19 NOTE — Assessment & Plan Note (Signed)
Continue CPAP as ordered with oxygen at night

## 2013-01-19 NOTE — Assessment & Plan Note (Signed)
This is most likely due to her CHF, obesity, prior pulmonary embolisms and pulmonary hypertension related to obstructive sleep apnea. However, the scarring versus pneumonia on the recent chest x-ray would suggest that there is perhaps another pulmonary process. I explained to her today that should she have pulmonary fibrosis it would likely be a difficult problem to treat.  Plan: -Continue 2 L of oxygen at rest and 4 L with exertion -Check a chest x-ray to followup this abnormal finding from March 2014 -If persistent scarring is noted in her lungs then she will need a chest CT and pulmonary function testing

## 2013-01-19 NOTE — Progress Notes (Signed)
Subjective:    Patient ID: [name] [name], female    DOB: 02-28-1932, 79 y.o.   MRN: [id number]  Synopsis: [name] is a 79 year old female who has a past medical history significant for congestive heart failure, pulmonary hypertension, pulmonary embolism, atrial fibrillation and obstructive sleep apnea. She previously been seen by Dr. Coralyn Helling for management of sleep apnea. She uses CPAP at 7 a 10 cm of water every night. At baseline she is on 2.5 L of oxygen continuously.  HPI   01/19/2013 ROV >> last month [name] [name] was admitted to the hospital for a bladder infection. Apparently during that time she had a chest x-ray which showed a possible pneumonia in her left lung. She tells me that she did not have increased shortness of breath or productive cough during that time. Her shortness of breath is at baseline and she says "I feel like in doing pretty good". She continues to keep a low-sodium diet and has remained as active as possible despite her multiple medical problems. She wants to participate in pulmonary rehabilitation. She continues to use and benefit from oxygen as well as a Atrovent.  She has been watching her fluid status closely.  Past Medical History  Diagnosis Date  . Congestive heart failure, unspecified     a. ECHO 6/0: EF 55-60%, mild LVH, mildly dilated RV w. mildly dec fx, RVSP 67  . Chronic atrial fibrillation     failed cardioversion in past  -repeat DC-CV on October 5th, 2010  . Personal history of DVT (deep vein thrombosis) 2008  . Obstructive sleep apnea   . HTN (hypertension)   . Morbid obesity   . Shingles     with postherpetic neuraigia  . GERD (gastroesophageal reflux disease)   . Hyperlipidemia   . Meningitis 1950s  . Gallstones     s/p cholecystectomy  . CAD (coronary artery disease)     nonobstructive by cath 8/10 - LAD 40-50% w mild PAH mean 29 w PVR 3.2 [place]  . AAA (abdominal aortic aneurysm)     small  . PE (pulmonary embolism)     history of       Review of Systems  Constitutional: Positive for fatigue. Negative for fever, chills and diaphoresis.  HENT: Negative for congestion, rhinorrhea, postnasal drip and sinus pressure.   Respiratory: Positive for shortness of breath. Negative for cough, choking, chest tightness and wheezing.   Cardiovascular: Positive for leg swelling. Negative for chest pain.       Objective:   Physical Exam   Filed Vitals:   01/19/13 1150  BP: 138/82  Pulse: 74  Height: 5\' 4"  (1.626 m)  Weight: 278 lb (126.1 kg)  SpO2: 95%  2.5L Solomon   Gen: chronically ill appearing, no acute distress, obese HEENT: NCAT,  EOMi,  PULM: CTA B CV: Irreg irreg, no mgr, no JVD AB: BS+, soft, nontender, no hsm Ext: warm, massive pitting edema in legs, no clubbing, no cyanosis     12/2012 CXR > Left lung scarring vs pneumonia   Assessment & Plan:   Chronic hypoxemic respiratory failure This is most likely due to her CHF, obesity, prior pulmonary embolisms and pulmonary hypertension related to obstructive sleep apnea. However, the scarring versus pneumonia on the recent chest x-ray would suggest that there is perhaps another pulmonary process. I explained to her today that should she have pulmonary fibrosis it would likely be a difficult problem to treat.  Plan: -Continue 2 L of oxygen at rest  and 4 L with exertion -Check a chest x-ray to followup this abnormal finding from March 2014 -If persistent scarring is noted in her lungs then she will need a chest CT and pulmonary function testing  EMBOLISM & INFARCTION, IATROGENIC PULMONARY Continue warfarin  DIASTOLIC HEART FAILURE, CHRONIC Continue followup with cardiology as well his low-sodium diet  OBSTRUCTIVE SLEEP APNEA Continue CPAP as ordered with oxygen at night    Updated Medication List Outpatient Encounter Prescriptions as of 01/19/2013  Medication Sig Dispense Refill  . Ascorbic Acid (VITAMIN C PO) Take 1 tablet by mouth daily.      [name] aspirin  (ASPIR-81) 81 MG EC tablet Take 81 mg by mouth daily.        [name] atorvastatin (LIPITOR) 10 MG tablet Take 1 tablet (10 mg total) by mouth daily.  30 tablet  6  . ATROVENT HFA 17 MCG/ACT inhaler Inhale 2 puffs into the lungs 4 (four) times daily.       . cetirizine (ZYRTEC ALLERGY) 10 MG tablet Take 10 mg by mouth daily.        [name] diltiazem (CARDIZEM CD) 120 MG 24 hr capsule Take 1 capsule (120 mg total) by mouth daily.  30 capsule  6  . flecainide (TAMBOCOR) 100 MG tablet Take 1 tablet (100 mg total) by mouth every 12 (twelve) hours.  60 tablet  3  . furosemide (LASIX) 40 MG tablet Take 1 tablet (40 mg total) by mouth 2 (two) times daily as needed.  60 tablet  6  . metolazone (ZAROXOLYN) 2.5 MG tablet Take 1 tablet (2.5 mg total) by mouth daily.  30 tablet  3  . Multiple Vitamin (MULTIVITAMIN) capsule Take 1 capsule by mouth daily.      . Multiple Vitamins-Minerals (ZINC PO) Take 1 tablet by mouth daily.      . NON FORMULARY Oxygen 2.5 liters daily      . oxyCODONE (OXYCONTIN) 10 MG 12 hr tablet Take 1 tablet (10 mg total) by mouth every 12 (twelve) hours as needed for pain.  60 tablet  0  . potassium chloride (KLOR-CON M20) 10 MEQ tablet Take three tablets twice daily  180 tablet  6  . venlafaxine XR (EFFEXOR-XR) 150 MG 24 hr capsule Take 1 capsule (150 mg total) by mouth daily.  30 capsule  11  . warfarin (COUMADIN) 2.5 MG tablet Take 2.5 mg by mouth as directed.      . warfarin (COUMADIN) 5 MG tablet Take 1 tablet (5 mg total) by mouth daily. Take one tablet by mouth daily or as directed  30 tablet  6   No facility-administered encounter medications on file as of 01/19/2013.

## 2013-01-19 NOTE — Assessment & Plan Note (Signed)
Continue followup with cardiology as well his low-sodium diet

## 2013-01-19 NOTE — Assessment & Plan Note (Signed)
Continue warfarin.

## 2013-01-19 NOTE — Patient Instructions (Signed)
Keep using your CPAP and oxygen as you are doing We will call you with the results of the Chest X-ray We will see you back in one year or sooner if needed

## 2013-01-21 ENCOUNTER — Ambulatory Visit (INDEPENDENT_AMBULATORY_CARE_PROVIDER_SITE_OTHER): Payer: Medicare Other | Admitting: Family Medicine

## 2013-01-21 DIAGNOSIS — I4891 Unspecified atrial fibrillation: Secondary | ICD-10-CM

## 2013-01-21 DIAGNOSIS — I749 Embolism and thrombosis of unspecified artery: Secondary | ICD-10-CM

## 2013-01-21 LAB — POCT INR: INR: 4.1

## 2013-01-25 DIAGNOSIS — N390 Urinary tract infection, site not specified: Secondary | ICD-10-CM | POA: Insufficient documentation

## 2013-01-25 NOTE — Assessment & Plan Note (Signed)
Well controlled. Continue current medication. Peripheral edema actually fairly good for her.

## 2013-01-25 NOTE — Assessment & Plan Note (Signed)
Pt asked about anticoagulation other than coumadin. I deferred the answer to Dr. Mariah Milling. Request his input on zarelto.  She is a high risk for falls, has been in sinus rhythm for some time and per Dr. Windell Hummingbird note he was considering stopping anticoagulation entirely at last OV if she continued to fall... She has not fallen further and I would lean toward continuing some time of anticoagulation as she is also on this for PE .

## 2013-01-25 NOTE — Assessment & Plan Note (Signed)
Improving, followed at wound care center.

## 2013-01-25 NOTE — Assessment & Plan Note (Signed)
Stable on venlafaxine. 

## 2013-01-25 NOTE — Assessment & Plan Note (Signed)
Resolved following antibiotics.

## 2013-01-25 NOTE — Assessment & Plan Note (Signed)
Due for re-eval. 

## 2013-01-28 ENCOUNTER — Ambulatory Visit (INDEPENDENT_AMBULATORY_CARE_PROVIDER_SITE_OTHER): Payer: Medicare Other | Admitting: Family Medicine

## 2013-01-28 DIAGNOSIS — I749 Embolism and thrombosis of unspecified artery: Secondary | ICD-10-CM

## 2013-01-28 DIAGNOSIS — I4891 Unspecified atrial fibrillation: Secondary | ICD-10-CM

## 2013-01-29 ENCOUNTER — Encounter: Payer: Self-pay | Admitting: Family Medicine

## 2013-02-10 ENCOUNTER — Encounter: Payer: Self-pay | Admitting: Cardiothoracic Surgery

## 2013-02-18 ENCOUNTER — Other Ambulatory Visit: Payer: Self-pay | Admitting: *Deleted

## 2013-02-18 MED ORDER — DILTIAZEM HCL ER COATED BEADS 120 MG PO CP24: 120.0000 mg | ORAL_CAPSULE | Freq: Every day | ORAL | Status: DC

## 2013-02-18 NOTE — Telephone Encounter (Signed)
Refilled Diltiazem sent to [phone] #[phone]

## 2013-03-02 ENCOUNTER — Encounter: Payer: Self-pay | Admitting: Surgery

## 2013-03-09 ENCOUNTER — Ambulatory Visit: Payer: Medicare Other | Admitting: Pulmonary Disease

## 2013-03-09 NOTE — Progress Notes (Signed)
No show

## 2013-03-11 ENCOUNTER — Other Ambulatory Visit: Payer: Self-pay | Admitting: *Deleted

## 2013-03-11 ENCOUNTER — Ambulatory Visit (INDEPENDENT_AMBULATORY_CARE_PROVIDER_SITE_OTHER): Payer: Medicare Other | Admitting: Family Medicine

## 2013-03-11 DIAGNOSIS — I4891 Unspecified atrial fibrillation: Secondary | ICD-10-CM

## 2013-03-11 DIAGNOSIS — I749 Embolism and thrombosis of unspecified artery: Secondary | ICD-10-CM

## 2013-03-11 LAB — POCT INR: INR: 3.2

## 2013-03-11 MED ORDER — METOLAZONE 2.5 MG PO TABS: 2.5000 mg | ORAL_TABLET | Freq: Every day | ORAL | Status: DC

## 2013-03-11 NOTE — Telephone Encounter (Signed)
Requested Prescriptions   Signed Prescriptions Disp Refills  . metolazone (ZAROXOLYN) 2.5 MG tablet 30 tablet 3    Sig: Take 1 tablet (2.5 mg total) by mouth daily.    Authorizing Provider: GOLLAN, TIMOTHY J    Ordering User: Kristena Wilhelmi C    

## 2013-03-13 ENCOUNTER — Encounter: Payer: Self-pay | Admitting: Surgery

## 2013-03-13 ENCOUNTER — Encounter: Payer: Self-pay | Admitting: Cardiothoracic Surgery

## 2013-03-29 ENCOUNTER — Ambulatory Visit (INDEPENDENT_AMBULATORY_CARE_PROVIDER_SITE_OTHER): Payer: Medicare Other | Admitting: Family Medicine

## 2013-03-29 DIAGNOSIS — Z7901 Long term (current) use of anticoagulants: Secondary | ICD-10-CM

## 2013-03-29 DIAGNOSIS — I4891 Unspecified atrial fibrillation: Secondary | ICD-10-CM

## 2013-03-29 DIAGNOSIS — I749 Embolism and thrombosis of unspecified artery: Secondary | ICD-10-CM

## 2013-03-29 DIAGNOSIS — Z5181 Encounter for therapeutic drug level monitoring: Secondary | ICD-10-CM

## 2013-03-29 LAB — POCT INR: INR: 4.2

## 2013-04-12 ENCOUNTER — Ambulatory Visit (INDEPENDENT_AMBULATORY_CARE_PROVIDER_SITE_OTHER): Payer: Medicare Other | Admitting: Family Medicine

## 2013-04-12 ENCOUNTER — Encounter: Payer: Self-pay | Admitting: Cardiothoracic Surgery

## 2013-04-12 ENCOUNTER — Encounter: Payer: Self-pay | Admitting: Surgery

## 2013-04-12 DIAGNOSIS — I749 Embolism and thrombosis of unspecified artery: Secondary | ICD-10-CM

## 2013-04-12 DIAGNOSIS — I4891 Unspecified atrial fibrillation: Secondary | ICD-10-CM

## 2013-04-16 ENCOUNTER — Ambulatory Visit (INDEPENDENT_AMBULATORY_CARE_PROVIDER_SITE_OTHER): Payer: Medicare Other | Admitting: Family Medicine

## 2013-04-16 DIAGNOSIS — I4891 Unspecified atrial fibrillation: Secondary | ICD-10-CM

## 2013-04-16 DIAGNOSIS — I749 Embolism and thrombosis of unspecified artery: Secondary | ICD-10-CM

## 2013-04-19 ENCOUNTER — Other Ambulatory Visit: Payer: Self-pay

## 2013-04-19 ENCOUNTER — Other Ambulatory Visit: Payer: Self-pay | Admitting: *Deleted

## 2013-04-19 MED ORDER — VENLAFAXINE HCL ER 150 MG PO CP24: 150.0000 mg | ORAL_CAPSULE | Freq: Every day | ORAL | Status: DC

## 2013-04-19 MED ORDER — WARFARIN SODIUM 5 MG PO TABS: 5.0000 mg | ORAL_TABLET | Freq: Every day | ORAL | Status: DC

## 2013-04-19 MED ORDER — FLECAINIDE ACETATE 100 MG PO TABS: 100.0000 mg | ORAL_TABLET | Freq: Two times a day (BID) | ORAL | Status: DC

## 2013-04-19 NOTE — Telephone Encounter (Signed)
Refill sent for flecinide 100 mg take one tablet twice a day.

## 2013-04-23 ENCOUNTER — Ambulatory Visit: Payer: Self-pay | Admitting: Family Medicine

## 2013-04-23 DIAGNOSIS — I4891 Unspecified atrial fibrillation: Secondary | ICD-10-CM

## 2013-04-23 DIAGNOSIS — I749 Embolism and thrombosis of unspecified artery: Secondary | ICD-10-CM

## 2013-04-23 LAB — POCT INR: INR: 2.5

## 2013-04-26 ENCOUNTER — Other Ambulatory Visit: Payer: Self-pay | Admitting: *Deleted

## 2013-04-26 MED ORDER — ATORVASTATIN CALCIUM 10 MG PO TABS: 10.0000 mg | ORAL_TABLET | Freq: Every day | ORAL | Status: DC

## 2013-05-05 ENCOUNTER — Ambulatory Visit (INDEPENDENT_AMBULATORY_CARE_PROVIDER_SITE_OTHER): Payer: Medicare Other | Admitting: Family Medicine

## 2013-05-05 DIAGNOSIS — I749 Embolism and thrombosis of unspecified artery: Secondary | ICD-10-CM

## 2013-05-05 DIAGNOSIS — I4891 Unspecified atrial fibrillation: Secondary | ICD-10-CM

## 2013-05-05 LAB — POCT INR: INR: 2.2

## 2013-05-10 ENCOUNTER — Ambulatory Visit (INDEPENDENT_AMBULATORY_CARE_PROVIDER_SITE_OTHER): Payer: Medicare Other | Admitting: Family Medicine

## 2013-05-10 DIAGNOSIS — I4891 Unspecified atrial fibrillation: Secondary | ICD-10-CM

## 2013-05-10 DIAGNOSIS — I749 Embolism and thrombosis of unspecified artery: Secondary | ICD-10-CM

## 2013-05-10 LAB — POCT INR
INR: 3.07
INR: 2.9

## 2013-05-11 ENCOUNTER — Ambulatory Visit (INDEPENDENT_AMBULATORY_CARE_PROVIDER_SITE_OTHER): Payer: Medicare Other | Admitting: Family Medicine

## 2013-05-11 ENCOUNTER — Encounter: Payer: Self-pay | Admitting: Family Medicine

## 2013-05-11 VITALS — BP 128/81 | HR 73 | Temp 97.4°F | Ht 64.0 in | Wt 282.8 lb

## 2013-05-11 DIAGNOSIS — M109 Gout, unspecified: Secondary | ICD-10-CM

## 2013-05-11 DIAGNOSIS — L02519 Cutaneous abscess of unspecified hand: Secondary | ICD-10-CM

## 2013-05-11 DIAGNOSIS — L03119 Cellulitis of unspecified part of limb: Secondary | ICD-10-CM | POA: Insufficient documentation

## 2013-05-11 NOTE — Assessment & Plan Note (Signed)
Acute flare, improving  Continue colchicine until flare gone. Work on lifestyle changes... Pt not interested in restarting allopurinol at this itme when flare over. Uric acid 11

## 2013-05-11 NOTE — Progress Notes (Signed)
   Subjective:    Patient ID: [name] [name], female    DOB: Jul 03, 1931, 79 y.o.   MRN: [id number]  HPI  79 year old female  presents for hospital follow up.  She was admitted to [facility] hospital on 12/24- 12/26 for vomtining, low grade fever, pain in right hand. She started allopurinol because she thought it was acute treatment.. Made it worse. She has been eating a lot of shellfish, meat etc. She thinks vomiting was from oxycodone/ constiptiaon.  Pertinent Test Results:  Admit Labs: Na 133 K 3.0 Cl 88 HCO3 33 BUN 50 Cr 1.40 Glc 142 Ca 8.7 AST 52 ALT 43 Alk Phos 122 TBili 0.6 PT 23.9 INR 2.1 WBC 16.4 Hgb 11.3 Hct 34.1 Plt 408  CRP 35.0  Uric Acid 11.4  Discharge Labs: Na 138 K 3.2 Cl 95 HCO3 34 BUN 54 Cr 1.20 Glc 113 Ca 8.7 Mg 2.7 Phos 3.2  PT 17.7 INR 1.6 WBC 13.1 Hgb 10.3 Hct 31.7 Plt 423 ANC 11.4  Microbiology: Blood cultures: NGTD x 1 day Urine culture: 40,000 colonies of Proteus mirabilis  Imaging: CXR (05/05/13): IMPRESSION: Unchanged cardiomegaly. Interval improvement in diffuse airspace opacities. No new airspace opacity.  X-ray R hand (05/05/13): FINDINGS: There is marked soft tissue swelling about the second ray of the right hand. On several views there appears to be some subcortical erosions which are also seen on the third finger as well. The joint space is not appear to be significantly distorted and this could represent gout although an infectious process is not totally excluded.   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   Given IVF, vancomycin and gout medicaiton. Stopped allopurinol. She was to restart this when flare resolved given uric acid 11, but pt wishes to treat with diet. Started on colchicine 0.6 mg daily and prednsione taper. Blood culture neg.  Since discharge she has had decrease in redness and pain. Redness  No further emesis.  No fever.  She has had 3 flares in last few years.   Review of Systems  Constitutional:  Negative for fever and fatigue.  HENT: Negative for ear pain.   Eyes: Negative for pain.  Respiratory: Negative for chest tightness and shortness of breath.   Cardiovascular: Negative for chest pain, palpitations and leg swelling.  Gastrointestinal: Negative for abdominal pain.  Genitourinary: Negative for dysuria.       Objective:   Physical Exam  Constitutional:  Morbidly obese female wheelchair bound, on oxygen  HENT:  Head: Normocephalic.  Eyes: Conjunctivae and EOM are normal. Pupils are equal, round, and reactive to light.  Neck: Normal range of motion. Neck supple. No thyromegaly present.  Cardiovascular: Normal rate, intact distal pulses and normal pulses.  An irregular rhythm present. Exam reveals distant heart sounds.   No murmur heard. 1+ edema bilaterally, right leg in bandage  Pulmonary/Chest: Effort normal and breath sounds normal. No respiratory distress. She has no decreased breath sounds. She has no wheezes. She has no rhonchi. She has no rales.  Abdominal: Soft. Normal appearance. There is no tenderness. There is no CVA tenderness.  Skin:  Erythema and swelling in right 2nd digit and over dorsal hand, extends no furher, decrease ROM 2nd digit  Psychiatric:  cheerful          Assessment & Plan:  Reviewed hospital notes. Total visit time 30 minutes, > 50% spent counseling and cordinating patients care.

## 2013-05-11 NOTE — Progress Notes (Signed)
Pre-visit discussion using our clinic review tool. No additional management support is needed unless otherwise documented below in the visit note.  

## 2013-05-11 NOTE — Assessment & Plan Note (Signed)
WBC decreased at discharge. ON clinda.. Complete course, Unclear if true infeciton.

## 2013-05-11 NOTE — Patient Instructions (Signed)
Remain off  Allopurinol. Continue colchicine until gout flare is completely resolved. Restart colchicine if flare returns 2 tabs x 1, then daily.

## 2013-05-13 ENCOUNTER — Encounter: Payer: Self-pay | Admitting: Cardiothoracic Surgery

## 2013-05-24 ENCOUNTER — Telehealth: Payer: Self-pay

## 2013-05-24 NOTE — Telephone Encounter (Signed)
Larita FifeLynn nurse with [facility] left v/m; Larita FifeLynn concerned pt has gained 3 lbs since yesterday; concerns of respiratory issues;has (something; could not understand wording on v/m and unable to reach LibertyLynn by phone now)  Bilaterally; has prod cough but today could not produce sputum for color. Pt coughing more than usual. Pt has taken prn Metolazone. Concerned possible onset of COPD or CHF exacerbations; request order for mobile CXR. Pt also wants clarification of directions for colcrys; will pt need to take long term or is this something pt can eventually stop? Larita FifeLynn request cb.

## 2013-05-25 NOTE — Telephone Encounter (Signed)
Okay ot have mobile CXR or proceed with apt in office. Colcrys will likely be temporary, but we may need to replace it with something to prevent gout attacks. Colcys 1 tab po daily until next appt to disucss.

## 2013-05-25 NOTE — Telephone Encounter (Signed)
Left message for Lynn to return my call

## 2013-05-25 NOTE — Telephone Encounter (Signed)
Larita [facility]ifeLynn with [facility] notified as instructed by telephone.

## 2013-05-26 ENCOUNTER — Telehealth: Payer: Self-pay

## 2013-05-26 ENCOUNTER — Ambulatory Visit (INDEPENDENT_AMBULATORY_CARE_PROVIDER_SITE_OTHER): Payer: Medicare Other | Admitting: Family Medicine

## 2013-05-26 DIAGNOSIS — I749 Embolism and thrombosis of unspecified artery: Secondary | ICD-10-CM

## 2013-05-26 DIAGNOSIS — I4891 Unspecified atrial fibrillation: Secondary | ICD-10-CM

## 2013-05-26 LAB — POCT INR: INR: 5.3

## 2013-05-26 NOTE — Telephone Encounter (Signed)
Tasha nurse with [facility] left v/m to verify Dr Ermalene SearingBedsole received portable CXR report that was faxed today at 12:00 noon. If need refaxed contact Tasha.

## 2013-05-26 NOTE — Telephone Encounter (Signed)
CXR results placed in Dr. Daphine DeutscherBedsole's in box for review.

## 2013-05-27 ENCOUNTER — Encounter: Payer: Self-pay | Admitting: Family Medicine

## 2013-05-27 NOTE — Telephone Encounter (Signed)
See note on CXR.[name]. Clear.

## 2013-05-28 ENCOUNTER — Other Ambulatory Visit: Payer: Self-pay | Admitting: *Deleted

## 2013-05-28 ENCOUNTER — Ambulatory Visit (INDEPENDENT_AMBULATORY_CARE_PROVIDER_SITE_OTHER): Payer: Medicare Other | Admitting: Family Medicine

## 2013-05-28 DIAGNOSIS — I4891 Unspecified atrial fibrillation: Secondary | ICD-10-CM

## 2013-05-28 DIAGNOSIS — I749 Embolism and thrombosis of unspecified artery: Secondary | ICD-10-CM

## 2013-05-28 LAB — POCT INR: INR: 3.4

## 2013-05-28 MED ORDER — ATORVASTATIN CALCIUM 10 MG PO TABS: 10.0000 mg | ORAL_TABLET | Freq: Every day | ORAL | Status: DC

## 2013-05-28 NOTE — Telephone Encounter (Signed)
Let pt known she is due for a preventative visit but refill for 3 months to give her time.

## 2013-05-28 NOTE — Telephone Encounter (Signed)
Patient was given notice at last RF that she should schedule CPE with labs prior.  No OV or labs scheduled.  Please advise.

## 2013-06-02 ENCOUNTER — Telehealth: Payer: Self-pay | Admitting: Family Medicine

## 2013-06-02 MED ORDER — CIPROFLOXACIN HCL 500 MG PO TABS: 500.0000 mg | ORAL_TABLET | Freq: Two times a day (BID) | ORAL | Status: DC

## 2013-06-02 NOTE — Telephone Encounter (Signed)
Pt is calling and says she has a full blown bladder infection. She says now it's so bad that when she stands she urinates. She had pain in her bladder as well. She says she drank a lot of water yesterday and now she can't hold her urine. Pt says she is not able to get up here and has no transportation. [facility] uses [facility].

## 2013-06-02 NOTE — Telephone Encounter (Signed)
Mrs. [name] [name] notified antibiotic has been sent in to her pharmacy. If symptoms do not resolve with antibiotics, she will need to be evaluated in the office.

## 2013-06-02 NOTE — Telephone Encounter (Signed)
Not ideal. No way to culture.  cipro 500 mg, 1 po bid, #14, 0 refills  Please call if she worsens and she will have to be evaluated.

## 2013-06-03 NOTE — Telephone Encounter (Signed)
Agreed -

## 2013-06-04 ENCOUNTER — Ambulatory Visit (INDEPENDENT_AMBULATORY_CARE_PROVIDER_SITE_OTHER): Payer: Medicare Other | Admitting: Family Medicine

## 2013-06-04 DIAGNOSIS — I749 Embolism and thrombosis of unspecified artery: Secondary | ICD-10-CM

## 2013-06-04 DIAGNOSIS — I4891 Unspecified atrial fibrillation: Secondary | ICD-10-CM

## 2013-06-04 LAB — POCT INR: INR: 4.4

## 2013-06-09 ENCOUNTER — Ambulatory Visit (INDEPENDENT_AMBULATORY_CARE_PROVIDER_SITE_OTHER): Payer: Medicare Other | Admitting: Family Medicine

## 2013-06-09 DIAGNOSIS — I4891 Unspecified atrial fibrillation: Secondary | ICD-10-CM

## 2013-06-09 DIAGNOSIS — I749 Embolism and thrombosis of unspecified artery: Secondary | ICD-10-CM

## 2013-06-09 LAB — POCT INR: INR: 2.9

## 2013-06-10 ENCOUNTER — Other Ambulatory Visit: Payer: Self-pay | Admitting: *Deleted

## 2013-06-10 MED ORDER — POTASSIUM CHLORIDE CRYS ER 10 MEQ PO TBCR: EXTENDED_RELEASE_TABLET | ORAL | Status: DC

## 2013-06-16 ENCOUNTER — Inpatient Hospital Stay: Payer: Self-pay | Admitting: Internal Medicine

## 2013-06-16 LAB — CBC
HCT: 38.5 % (ref 35.0–47.0)
HGB: 12.6 g/dL (ref 12.0–16.0)
MCH: 29.4 pg (ref 26.0–34.0)
MCHC: 32.7 g/dL (ref 32.0–36.0)
MCV: 90 fL (ref 80–100)
Platelet: 379 10*3/uL (ref 150–440)
RBC: 4.28 10*6/uL (ref 3.80–5.20)
RDW: 18.5 % — ABNORMAL HIGH (ref 11.5–14.5)
WBC: 14.2 10*3/uL — ABNORMAL HIGH (ref 3.6–11.0)

## 2013-06-16 LAB — COMPREHENSIVE METABOLIC PANEL
ALK PHOS: 103 U/L
ALT: 35 U/L (ref 12–78)
ANION GAP: 1 — AB (ref 7–16)
Albumin: 3.2 g/dL — ABNORMAL LOW (ref 3.4–5.0)
BUN: 60 mg/dL — ABNORMAL HIGH (ref 7–18)
Bilirubin,Total: 0.3 mg/dL (ref 0.2–1.0)
Calcium, Total: 9.7 mg/dL (ref 8.5–10.1)
Chloride: 91 mmol/L — ABNORMAL LOW (ref 98–107)
Co2: 39 mmol/L — ABNORMAL HIGH (ref 21–32)
Creatinine: 1.84 mg/dL — ABNORMAL HIGH (ref 0.60–1.30)
EGFR (African American): 30 — ABNORMAL LOW
GFR CALC NON AF AMER: 26 — AB
Glucose: 113 mg/dL — ABNORMAL HIGH (ref 65–99)
Osmolality: 280 (ref 275–301)
Potassium: 3.1 mmol/L — ABNORMAL LOW (ref 3.5–5.1)
SGOT(AST): 34 U/L (ref 15–37)
Sodium: 131 mmol/L — ABNORMAL LOW (ref 136–145)
TOTAL PROTEIN: 8.1 g/dL (ref 6.4–8.2)

## 2013-06-16 LAB — PROTIME-INR
INR: 3.6
Prothrombin Time: 34.6 secs — ABNORMAL HIGH (ref 11.5–14.7)

## 2013-06-17 ENCOUNTER — Telehealth: Payer: Self-pay | Admitting: Family Medicine

## 2013-06-17 DIAGNOSIS — I4891 Unspecified atrial fibrillation: Secondary | ICD-10-CM

## 2013-06-17 DIAGNOSIS — D689 Coagulation defect, unspecified: Secondary | ICD-10-CM

## 2013-06-17 LAB — CBC WITH DIFFERENTIAL/PLATELET
Basophil #: 0.1 10*3/uL (ref 0.0–0.1)
Basophil %: 0.5 %
Eosinophil #: 0.1 10*3/uL (ref 0.0–0.7)
Eosinophil %: 0.5 %
HCT: 31.2 % — ABNORMAL LOW (ref 35.0–47.0)
HGB: 10.3 g/dL — ABNORMAL LOW (ref 12.0–16.0)
Lymphocyte #: 2.4 10*3/uL (ref 1.0–3.6)
Lymphocyte %: 12.2 %
MCH: 29.7 pg (ref 26.0–34.0)
MCHC: 32.9 g/dL (ref 32.0–36.0)
MCV: 90 fL (ref 80–100)
Monocyte #: 1.6 x10 3/mm — ABNORMAL HIGH (ref 0.2–0.9)
Monocyte %: 8.4 %
NEUTROS ABS: 15.4 10*3/uL — AB (ref 1.4–6.5)
Neutrophil %: 78.4 %
PLATELETS: 348 10*3/uL (ref 150–440)
RBC: 3.46 10*6/uL — ABNORMAL LOW (ref 3.80–5.20)
RDW: 18.2 % — ABNORMAL HIGH (ref 11.5–14.5)
WBC: 19.6 10*3/uL — ABNORMAL HIGH (ref 3.6–11.0)

## 2013-06-17 LAB — BASIC METABOLIC PANEL
Anion Gap: 6 — ABNORMAL LOW (ref 7–16)
BUN: 99 mg/dL — ABNORMAL HIGH (ref 7–18)
CALCIUM: 9.2 mg/dL (ref 8.5–10.1)
Chloride: 91 mmol/L — ABNORMAL LOW (ref 98–107)
Co2: 36 mmol/L — ABNORMAL HIGH (ref 21–32)
Creatinine: 1.89 mg/dL — ABNORMAL HIGH (ref 0.60–1.30)
GFR CALC AF AMER: 29 — AB
GFR CALC NON AF AMER: 25 — AB
GLUCOSE: 116 mg/dL — AB (ref 65–99)
Osmolality: 298 (ref 275–301)
POTASSIUM: 2.7 mmol/L — AB (ref 3.5–5.1)
SODIUM: 133 mmol/L — AB (ref 136–145)

## 2013-06-17 LAB — HEMOGLOBIN: HGB: 9.6 g/dL — ABNORMAL LOW (ref 12.0–16.0)

## 2013-06-17 LAB — PROTIME-INR
INR: 3.1
Prothrombin Time: 31.2 secs — ABNORMAL HIGH (ref 11.5–14.7)

## 2013-06-17 LAB — POTASSIUM: POTASSIUM: 3.2 mmol/L — AB (ref 3.5–5.1)

## 2013-06-17 LAB — MAGNESIUM: MAGNESIUM: 2.5 mg/dL — AB

## 2013-06-17 NOTE — Telephone Encounter (Signed)
I am not sure who set that range for her.. Agree to change. Let Dr. Mariah MillingGollan know.

## 2013-06-17 NOTE — Telephone Encounter (Signed)
Alinda Moneyony, PA aware of change.  He also mentioned that pt is on Effexor which interacts with her Coumadin (may be the reason her INR has been up so many times).  He said that she may need to switch to an alternative medication.

## 2013-06-17 NOTE — Telephone Encounter (Signed)
Alinda Moneyony, [place] with Dr. Windell HummingbirdGollan's office called.  He is requesting that INR range be lowered to 2.0-3.0 instead of 2.5-3.5.  I'm not sure why we have her range at 2.5-3.5 since she's on Coumadin for A-fib (normally 2.0-3.0).  She's been on Coumadin since at least 2012 so I'm not sure who determined that range for her.  She is checked by home health and she is never consistent with her numbers.  If it's ok to change, please let me know and I'll get back in touch with him.  If you have questions, his pager # is [phone].

## 2013-06-18 DIAGNOSIS — I4891 Unspecified atrial fibrillation: Secondary | ICD-10-CM

## 2013-06-18 DIAGNOSIS — D689 Coagulation defect, unspecified: Secondary | ICD-10-CM

## 2013-06-18 LAB — CBC WITH DIFFERENTIAL/PLATELET
BASOS ABS: 0.1 10*3/uL (ref 0.0–0.1)
Basophil %: 0.4 %
EOS PCT: 1.9 %
Eosinophil #: 0.3 10*3/uL (ref 0.0–0.7)
HCT: 26.6 % — AB (ref 35.0–47.0)
HGB: 8.9 g/dL — AB (ref 12.0–16.0)
Lymphocyte #: 1.7 10*3/uL (ref 1.0–3.6)
Lymphocyte %: 9.5 %
MCH: 30.1 pg (ref 26.0–34.0)
MCHC: 33.3 g/dL (ref 32.0–36.0)
MCV: 90 fL (ref 80–100)
Monocyte #: 1.6 x10 3/mm — ABNORMAL HIGH (ref 0.2–0.9)
Monocyte %: 8.7 %
NEUTROS PCT: 79.5 %
Neutrophil #: 14.5 10*3/uL — ABNORMAL HIGH (ref 1.4–6.5)
Platelet: 324 10*3/uL (ref 150–440)
RBC: 2.94 10*6/uL — AB (ref 3.80–5.20)
RDW: 18.3 % — ABNORMAL HIGH (ref 11.5–14.5)
WBC: 18.3 10*3/uL — ABNORMAL HIGH (ref 3.6–11.0)

## 2013-06-18 LAB — PROTIME-INR
INR: 1.6
Prothrombin Time: 19.1 secs — ABNORMAL HIGH (ref 11.5–14.7)

## 2013-06-18 LAB — BASIC METABOLIC PANEL
Anion Gap: 3 — ABNORMAL LOW (ref 7–16)
BUN: 77 mg/dL — ABNORMAL HIGH (ref 7–18)
CO2: 38 mmol/L — AB (ref 21–32)
CREATININE: 1.79 mg/dL — AB (ref 0.60–1.30)
Calcium, Total: 9.6 mg/dL (ref 8.5–10.1)
Chloride: 95 mmol/L — ABNORMAL LOW (ref 98–107)
EGFR (Non-African Amer.): 26 — ABNORMAL LOW
GFR CALC AF AMER: 31 — AB
Glucose: 117 mg/dL — ABNORMAL HIGH (ref 65–99)
Osmolality: 296 (ref 275–301)
POTASSIUM: 3.2 mmol/L — AB (ref 3.5–5.1)
Sodium: 136 mmol/L (ref 136–145)

## 2013-06-18 LAB — HEMOGLOBIN: HGB: 9.1 g/dL — AB (ref 12.0–16.0)

## 2013-06-18 NOTE — Telephone Encounter (Signed)
Will discuss at follow up OV.

## 2013-06-19 ENCOUNTER — Encounter: Payer: Self-pay | Admitting: Cardiothoracic Surgery

## 2013-06-21 ENCOUNTER — Telehealth: Payer: Self-pay

## 2013-06-21 NOTE — Telephone Encounter (Signed)
Patient [facility].  Patient understands to follow up with provider Dr. Mariah MillingGollan on 06/24/13 at [facility]. Patient understands discharge instructions? yes Patient understands medications and regiment? yes Patient understands to bring all medications to this visit? yes

## 2013-06-22 ENCOUNTER — Ambulatory Visit: Payer: Medicare Other | Admitting: Family Medicine

## 2013-06-22 ENCOUNTER — Other Ambulatory Visit (INDEPENDENT_AMBULATORY_CARE_PROVIDER_SITE_OTHER): Payer: Medicare Other

## 2013-06-22 ENCOUNTER — Other Ambulatory Visit: Payer: Medicare Other

## 2013-06-22 DIAGNOSIS — D62 Acute posthemorrhagic anemia: Secondary | ICD-10-CM

## 2013-06-22 LAB — HEMOGLOBIN AND HEMATOCRIT, BLOOD: Hemoglobin: 8.6 g/dL — ABNORMAL LOW (ref 12.0–15.0)

## 2013-06-24 ENCOUNTER — Ambulatory Visit (INDEPENDENT_AMBULATORY_CARE_PROVIDER_SITE_OTHER): Payer: Medicare Other | Admitting: Cardiovascular Disease

## 2013-06-24 ENCOUNTER — Encounter: Payer: Self-pay | Admitting: Cardiovascular Disease

## 2013-06-24 ENCOUNTER — Encounter: Payer: Self-pay | Admitting: Family Medicine

## 2013-06-24 ENCOUNTER — Telehealth: Payer: Self-pay | Admitting: Family Medicine

## 2013-06-24 ENCOUNTER — Ambulatory Visit (INDEPENDENT_AMBULATORY_CARE_PROVIDER_SITE_OTHER): Payer: Medicare Other | Admitting: Family Medicine

## 2013-06-24 VITALS — BP 100/60 | HR 75 | Temp 97.3°F | Ht 64.0 in | Wt 281.0 lb

## 2013-06-24 VITALS — BP 100/58 | HR 76 | Ht 64.0 in | Wt 281.0 lb

## 2013-06-24 DIAGNOSIS — R040 Epistaxis: Secondary | ICD-10-CM | POA: Insufficient documentation

## 2013-06-24 DIAGNOSIS — N183 Chronic kidney disease, stage 3 unspecified: Secondary | ICD-10-CM | POA: Insufficient documentation

## 2013-06-24 DIAGNOSIS — I2699 Other pulmonary embolism without acute cor pulmonale: Secondary | ICD-10-CM

## 2013-06-24 DIAGNOSIS — I4891 Unspecified atrial fibrillation: Secondary | ICD-10-CM

## 2013-06-24 DIAGNOSIS — I10 Essential (primary) hypertension: Secondary | ICD-10-CM

## 2013-06-24 DIAGNOSIS — M109 Gout, unspecified: Secondary | ICD-10-CM

## 2013-06-24 DIAGNOSIS — T81718A Complication of other artery following a procedure, not elsewhere classified, initial encounter: Secondary | ICD-10-CM

## 2013-06-24 DIAGNOSIS — S81809A Unspecified open wound, unspecified lower leg, initial encounter: Secondary | ICD-10-CM

## 2013-06-24 DIAGNOSIS — I5032 Chronic diastolic (congestive) heart failure: Secondary | ICD-10-CM

## 2013-06-24 DIAGNOSIS — D509 Iron deficiency anemia, unspecified: Secondary | ICD-10-CM

## 2013-06-24 MED ORDER — COLCHICINE 0.6 MG PO TABS: ORAL_TABLET | ORAL | Status: DC

## 2013-06-24 MED ORDER — FERROUS SULFATE 325 (65 FE) MG PO TABS: 325.0000 mg | ORAL_TABLET | Freq: Every day | ORAL | Status: DC

## 2013-06-24 MED ORDER — ALLOPURINOL 100 MG PO TABS: 100.0000 mg | ORAL_TABLET | Freq: Every day | ORAL | Status: DC

## 2013-06-24 NOTE — Assessment & Plan Note (Addendum)
Suggested he continue low-dose warfarin. Encouraged she wear her Velcro compresses on her legs bilaterally

## 2013-06-24 NOTE — Assessment & Plan Note (Signed)
Flare resolved... Restart allopurinol and reserve colchicine for flare treatment.

## 2013-06-24 NOTE — Assessment & Plan Note (Signed)
Worsened in hospital... Recheck today.

## 2013-06-24 NOTE — Assessment & Plan Note (Signed)
No further episodes since the hospital discharge. Will restart warfarin but at a much lower dose, starting every other day for several days, then warfarin 2.5 times daily. Goal INR 2.0 or less

## 2013-06-24 NOTE — Assessment & Plan Note (Addendum)
Recent labwork suggest mild dehydration. Creatinine and BUN elevated. Weight has been stable and we will let her continue to adjust her diuretics based on her weight. Potassium has been low in the hospital, 3.1 down to 2.9 up to 3.2 on discharge. We'll recommend she skipped Lasix for a day, take 4 potassium pills that day, and back to her regular regimen. Encouraged foods high in potassium. we will call her with the details S. labs were obtained after she had left the clinic.

## 2013-06-24 NOTE — Telephone Encounter (Signed)
Spoke with Larita FifeLynn, RN with [facility] regarding pt's visit with Dr. Mariah MillingGollan today.  He changed her Coumadin to a lower dose of 2.5mg  daily and changed her range to 1.8-2.2.  He states in his note that she may not need INR checks in the future.  He did say he wants Home Health to check weekly x 1 month and fax results to their office.  Larita FifeLynn is aware and will send results to Dr. Windell HummingbirdGollan's office.

## 2013-06-24 NOTE — Assessment & Plan Note (Signed)
Reviewed Dr., Windell HummingbirdGollan's note. Agree with plan. Pt is high risk coumadin pt.

## 2013-06-24 NOTE — Assessment & Plan Note (Signed)
Well controlled. Continue current medication.  

## 2013-06-24 NOTE — Patient Instructions (Addendum)
Start ferrous sulfate 325 mg daily. If constipation begin start miralax daily. Stop colchince.. Use only for flare ups, 2 tabs x 1 with repeat dose of 1 tab 2 hours later, then daily until flare gone.   Now that flare is resolved.[name]. Restart allopurinol daily and continue longterm. Stop at lab on way out.  Follow up  appt 30 min with labs prior in 2 months.

## 2013-06-24 NOTE — Assessment & Plan Note (Signed)
Blood pressure is well controlled on today's visit. No changes made to the medications. 

## 2013-06-24 NOTE — Assessment & Plan Note (Signed)
She is maintaining normal sinus rhythm. She has done so for quite some time. Recommend she continue diltiazem, flecainide. We will decrease the warfarin dosing down to 2.5 mg daily. INR would be on are 1.8-2.2. In fact may not even need INR checks in the future. We will have the home nurse check her INR weekly for one month and fax to  our office

## 2013-06-24 NOTE — Progress Notes (Signed)
Patient ID: [name] [name], female    DOB: Nov 29, 1931, 80 y.o.   MRN: [id number]  HPI Comments: 80 y/o woman with h/o morbid obesity, sleep apnea and obesity hypoventilation syndrome, atrial fib maintaining NSR on flecainide, DVT on the left and PE in 2007, diastolic HF on chronic diuretics, nonobstructive CAD by cath 8/10 and small AAA, chronic shortness of breath, edema.      episode of syncope/loss of consciousness in February 2014 on Valentine's Day. Creatinine at that time was 1.7 .She was given IV fluids. secondary to a huge hematoma of her right leg, she has had months of wound evaluation. Not a candidate for skin grafting. She has a leg wrap on the right. No swelling of her left lower extremity, only the right. Region of skin loss on the right is extensive. discharge 07/27/2012  In followup today, she reports that her leg has made dramatic improvement over the course of the past year. She only has a small region of residual open wound. She continues to see the wound clinic, has a leg wrap in place.  Recent hospitalization 07/14/2013 for epistaxis. INR was 3.7. She had her nose packed and cauterized. Cardiology evaluated the patient on 06/17/2013. She had been maintaining normal sinus rhythm over the course of the past year as far as we know. Flecainide and diltiazem was continued. She was discharged on 06/18/2013 Lab work in the hospital 06/16/2013 showed creatinine 1.84, BUN 60, potassium 3.1 She does report having Velcro compression assist devices for her legs.  Echocardiogram in the hospital 06/27/2012 was essentially normal, mildly dilated left atrium  EKG shows normal sinus rhythm with rate of 76 beats per minute, sinus arrhythmia, no significant ST or T wave changes   Outpatient Encounter Prescriptions as of 06/24/2013  Medication Sig  . Ascorbic Acid (VITAMIN C PO) Take 1 tablet by mouth daily.  [name] aspirin (ASPIR-81) 81 MG EC tablet Take 81 mg by mouth daily.    [name] atorvastatin  (LIPITOR) 10 MG tablet Take 1 tablet (10 mg total) by mouth daily. Please schedule a physical, having fasting labs prior, for any additional refills prior.  . ATROVENT HFA 17 MCG/ACT inhaler Inhale 2 puffs into the lungs 4 (four) times daily.   . cetirizine (ZYRTEC ALLERGY) 10 MG tablet Take 10 mg by mouth daily.    . clindamycin (CLEOCIN) 150 MG capsule Take 150 mg by mouth 3 (three) times daily.  . colchicine 0.6 MG tablet Take 0.6 mg by mouth daily.  [name] diltiazem (CARDIZEM CD) 120 MG 24 hr capsule Take 1 capsule (120 mg total) by mouth daily.  . flecainide (TAMBOCOR) 100 MG tablet Take 1 tablet (100 mg total) by mouth every 12 (twelve) hours.  . furosemide (LASIX) 40 MG tablet Take 1 tablet (40 mg total) by mouth 2 (two) times daily as needed.  . metolazone (ZAROXOLYN) 2.5 MG tablet Take 1 tablet (2.5 mg total) by mouth daily.  . Multiple Vitamin (MULTIVITAMIN) capsule Take 1 capsule by mouth daily.  . Multiple Vitamins-Minerals (ZINC PO) Take 1 tablet by mouth daily.  . NON FORMULARY Oxygen 2.5 liters daily  . oxyCODONE (OXYCONTIN) 10 MG 12 hr tablet Take 1 tablet (10 mg total) by mouth every 12 (twelve) hours as needed for pain.  . potassium chloride (K-DUR,KLOR-CON) 10 MEQ tablet Take three tablets twice daily  . SENNA CO by Combination route daily.  [name] venlafaxine XR (EFFEXOR-XR) 150 MG 24 hr capsule Take 1 capsule (150 mg total) by mouth  daily.  . [DISCONTINUED] ciprofloxacin (CIPRO) 500 MG tablet Take 1 tablet (500 mg total) by mouth 2 (two) times daily.  . [DISCONTINUED] warfarin (COUMADIN) 2.5 MG tablet Take 2.5 mg by mouth as directed.  . [DISCONTINUED] warfarin (COUMADIN) 5 MG tablet Take 1 tablet (5 mg total) by mouth daily. Take one tablet by mouth daily or as directed    Review of Systems  Constitutional: Negative.   HENT: Negative.   Eyes: Negative.   Cardiovascular: Positive for leg swelling.  Gastrointestinal: Negative.   Musculoskeletal: Positive for back pain.  Skin:  Negative.   Neurological: Negative.   Psychiatric/Behavioral: Negative.   All other systems reviewed and are negative.    BP 100/58  Pulse 76  Ht 5\' 4"  (1.626 m)  Wt 281 lb (127.461 kg)  BMI 48.21 kg/m2  Physical Exam  Nursing note and vitals reviewed. Constitutional: She is oriented to person, place, and time. She appears well-developed and well-nourished.  Obese  HENT:  Head: Normocephalic.  Nose: Nose normal.  Mouth/Throat: Oropharynx is clear and moist.  Eyes: Conjunctivae are normal. Pupils are equal, round, and reactive to light.  Neck: Normal range of motion. Neck supple. No JVD present.  Cardiovascular: Normal rate, regular rhythm, S1 normal, S2 normal, normal heart sounds and intact distal pulses.  Exam reveals no gallop and no friction rub.   No murmur heard. No significant edema  Pulmonary/Chest: Effort normal and breath sounds normal. No respiratory distress. She has no wheezes. She has no rales. She exhibits no tenderness.  Abdominal: Soft. Bowel sounds are normal. She exhibits no distension. There is no tenderness.  Musculoskeletal: Normal range of motion. She exhibits no edema and no tenderness.  Lymphadenopathy:    She has no cervical adenopathy.  Neurological: She is alert and oriented to person, place, and time. Coordination normal.  Skin: Skin is warm and dry. No rash noted. No erythema.  Leg wrap on the right lower extremity  Psychiatric: She has a normal mood and affect. Her behavior is normal. Judgment and thought content normal.    Assessment and Plan

## 2013-06-24 NOTE — Assessment & Plan Note (Signed)
We have encouraged continued careful diet management in an effort to lose weight.  

## 2013-06-24 NOTE — Patient Instructions (Addendum)
You are doing well.  Please take warfarin 1/2 pill (2.5 mg) once a day Goal INR 1.8 to 2.2 We will have home nursing check your INR once a week for one month We may not need to do INR checks after one month if INR remains low  Please call us if you have new issues that need to be addressed before your next appt.  Your physician wants you to follow-up in: 6 months.  You will receive a reminder letter in the mail two months in advance. If you don't receive a letter, please call our office to schedule the follow-up appointment.

## 2013-06-24 NOTE — Assessment & Plan Note (Signed)
Significant improvement in her right lower extremity wound.

## 2013-06-24 NOTE — Assessment & Plan Note (Signed)
Start with low dose iron along with miralax to avoid constipation.

## 2013-06-24 NOTE — Progress Notes (Signed)
   Subjective:    Patient ID: [name] [name], female    DOB: 10/07/1931, 80 y.o.   MRN: [id number]  HPI   80 year old female with history of morbid obesity, HTN, afib and history of PE on chronic coumadin, diastolic heart failure,  OSA, presents for hospital follow up for hospital follow up .  She was [facility] on [facility]  for supratheraputic INR and epistaxis. CBC in hospital was nml per admission summary, at D/C 9.1.  Hemoglobin 06/21/2013 was 8.6. She is worried about iron given SE of constipation. No BM issues right know.  She has no further nose bleeding and ENT removed the packing 2/12.  She saw Dr. Mariah MillingGollan today... Plans to change to 2.5 mg daily of coumadin. HH to check daily.  Wt Readings from Last 3 Encounters:  06/24/13 281 lb (127.461 kg)  06/24/13 281 lb (127.461 kg)  05/11/13 282 lb 12 oz (128.255 kg)   Mood is stable on venlafaxine... Does not wish to change at this time.  Gout flare is completely resolved. Taking  colchicine every day.  Needs to restart allopurinol.  Review of Systems  Constitutional: Positive for fatigue. Negative for fever.  HENT: Negative for ear pain and nosebleeds.   Eyes: Negative for pain.  Respiratory: Negative for shortness of breath.   Cardiovascular: Negative for chest pain.  Gastrointestinal: Negative for abdominal pain.       Objective:   Physical Exam  Constitutional:  Morbidly obese female wheelchair bound, on oxygen  HENT:  Head: Normocephalic.  Eyes: Conjunctivae and EOM are normal. Pupils are equal, round, and reactive to light.  Neck: Normal range of motion. Neck supple. No thyromegaly present.  Cardiovascular: Normal rate, intact distal pulses and normal pulses.  An irregular rhythm present. Exam reveals distant heart sounds.   No murmur heard. 1+ edema bilaterally, right leg in bandage  Pulmonary/Chest: Effort normal and breath sounds normal. No respiratory distress. She has no decreased breath sounds. She has no  wheezes. She has no rhonchi. She has no rales.  Abdominal: Soft. Normal appearance. There is no tenderness. There is no CVA tenderness.  Psychiatric:  cheerful          Assessment & Plan:

## 2013-06-24 NOTE — Progress Notes (Signed)
Pre-visit discussion using our clinic review tool. No additional management support is needed unless otherwise documented below in the visit note.  

## 2013-06-24 NOTE — Assessment & Plan Note (Signed)
Resolved

## 2013-06-25 ENCOUNTER — Other Ambulatory Visit: Payer: Self-pay | Admitting: *Deleted

## 2013-06-25 LAB — BASIC METABOLIC PANEL
BUN: 30 mg/dL — ABNORMAL HIGH (ref 6–23)
CO2: 29 mEq/L (ref 19–32)
Calcium: 9.9 mg/dL (ref 8.4–10.5)
Chloride: 96 mEq/L (ref 96–112)
Creatinine, Ser: 1.6 mg/dL — ABNORMAL HIGH (ref 0.4–1.2)
GFR: 33.23 mL/min — AB (ref >60.00)
GLUCOSE: 94 mg/dL (ref 70–99)
POTASSIUM: 3.4 meq/L — AB (ref 3.5–5.1)
Sodium: 139 mEq/L (ref 135–145)

## 2013-06-25 MED ORDER — FUROSEMIDE 40 MG PO TABS: 40.0000 mg | ORAL_TABLET | Freq: Two times a day (BID) | ORAL | Status: DC | PRN

## 2013-06-25 NOTE — Telephone Encounter (Signed)
Requested Prescriptions   Signed Prescriptions Disp Refills  . furosemide (LASIX) 40 MG tablet 60 tablet 3    Sig: Take 1 tablet (40 mg total) by mouth 2 (two) times daily as needed.    Authorizing Provider: Antonieta IbaGOLLAN, TIMOTHY J    Ordering User: Kendrick FriesLOPEZ, Jawara Latorre C

## 2013-07-01 ENCOUNTER — Telehealth: Payer: Self-pay | Admitting: Family Medicine

## 2013-07-01 NOTE — Telephone Encounter (Signed)
[name] [name], [facility].  INR today 1.2.  Last OV note says to forward to your office.  Cb [id number].  Thanks.

## 2013-07-02 ENCOUNTER — Other Ambulatory Visit: Payer: Self-pay | Admitting: *Deleted

## 2013-07-02 ENCOUNTER — Other Ambulatory Visit: Payer: Self-pay | Admitting: Family Medicine

## 2013-07-02 MED ORDER — METOLAZONE 2.5 MG PO TABS: 2.5000 mg | ORAL_TABLET | Freq: Every day | ORAL | Status: DC

## 2013-07-02 MED ORDER — COLCHICINE 0.6 MG PO TABS: ORAL_TABLET | ORAL | Status: DC

## 2013-07-02 NOTE — Telephone Encounter (Signed)
Requested Prescriptions   Signed Prescriptions Disp Refills  . metolazone (ZAROXOLYN) 2.5 MG tablet 30 tablet 3    Sig: Take 1 tablet (2.5 mg total) by mouth daily.    Authorizing Provider: Antonieta IbaGOLLAN, TIMOTHY J    Ordering User: Kendrick FriesLOPEZ, MARINA C

## 2013-07-02 NOTE — Telephone Encounter (Signed)
Ok to refill 

## 2013-07-05 NOTE — Telephone Encounter (Signed)
We're going to run her INR low, less than 2 If she is taking warfarin 2.5 mg daily Would take warfarin 2.5 mg 5 days per week, 5 mg on today's per week Recheck INR in 2 weeks

## 2013-07-07 ENCOUNTER — Other Ambulatory Visit: Payer: Self-pay | Admitting: *Deleted

## 2013-07-07 MED ORDER — IPRATROPIUM BROMIDE HFA 17 MCG/ACT IN AERS
2.0000 | INHALATION_SPRAY | Freq: Four times a day (QID) | RESPIRATORY_TRACT | Status: DC
Start: 2013-07-07 — End: 2014-03-02

## 2013-07-07 NOTE — Telephone Encounter (Signed)
Left detailed message on Lynn's voicemail w/ Dr. Windell HummingbirdGollan's recommendation and asked her to call back w/ any questions or concerns.

## 2013-07-11 ENCOUNTER — Encounter: Payer: Self-pay | Admitting: Cardiothoracic Surgery

## 2013-07-15 LAB — POCT INR: INR: 2.1

## 2013-07-17 DIAGNOSIS — R040 Epistaxis: Secondary | ICD-10-CM

## 2013-07-17 DIAGNOSIS — I2699 Other pulmonary embolism without acute cor pulmonale: Secondary | ICD-10-CM

## 2013-07-17 DIAGNOSIS — S81809A Unspecified open wound, unspecified lower leg, initial encounter: Secondary | ICD-10-CM

## 2013-07-17 DIAGNOSIS — I5032 Chronic diastolic (congestive) heart failure: Secondary | ICD-10-CM

## 2013-07-17 DIAGNOSIS — I4891 Unspecified atrial fibrillation: Secondary | ICD-10-CM

## 2013-07-17 DIAGNOSIS — T81718A Complication of other artery following a procedure, not elsewhere classified, initial encounter: Secondary | ICD-10-CM

## 2013-07-19 ENCOUNTER — Telehealth: Payer: Self-pay

## 2013-07-19 NOTE — Telephone Encounter (Signed)
[facility] physical therapist left v/m requesting verbal order for continuing PT home health 2 x a week for 3 more weeks for gait training.Please advise.

## 2013-07-20 NOTE — Telephone Encounter (Signed)
[facility] for [name] with verbal orders to continue home PT 2 x a week for 3 more weeks.

## 2013-07-20 NOTE — Telephone Encounter (Signed)
provide order verbally as requested.

## 2013-07-26 ENCOUNTER — Other Ambulatory Visit: Payer: Self-pay | Admitting: Family Medicine

## 2013-07-27 MED ORDER — VENLAFAXINE HCL ER 150 MG PO CP24: 150.0000 mg | ORAL_CAPSULE | Freq: Every day | ORAL | Status: DC

## 2013-07-28 ENCOUNTER — Ambulatory Visit (INDEPENDENT_AMBULATORY_CARE_PROVIDER_SITE_OTHER): Payer: Medicare Other | Admitting: Cardiovascular Disease

## 2013-07-28 DIAGNOSIS — I4891 Unspecified atrial fibrillation: Secondary | ICD-10-CM

## 2013-07-28 DIAGNOSIS — I749 Embolism and thrombosis of unspecified artery: Secondary | ICD-10-CM

## 2013-07-28 DIAGNOSIS — Z5181 Encounter for therapeutic drug level monitoring: Secondary | ICD-10-CM

## 2013-07-29 LAB — POCT INR: INR: 1.9

## 2013-07-30 ENCOUNTER — Ambulatory Visit (INDEPENDENT_AMBULATORY_CARE_PROVIDER_SITE_OTHER): Payer: Medicare Other | Admitting: Cardiology

## 2013-07-30 DIAGNOSIS — Z5181 Encounter for therapeutic drug level monitoring: Secondary | ICD-10-CM

## 2013-07-30 DIAGNOSIS — I749 Embolism and thrombosis of unspecified artery: Secondary | ICD-10-CM

## 2013-07-30 DIAGNOSIS — I4891 Unspecified atrial fibrillation: Secondary | ICD-10-CM

## 2013-08-05 LAB — POCT INR: INR: 2.4

## 2013-08-06 ENCOUNTER — Ambulatory Visit (INDEPENDENT_AMBULATORY_CARE_PROVIDER_SITE_OTHER): Payer: Medicare Other | Admitting: Cardiology

## 2013-08-06 ENCOUNTER — Telehealth: Payer: Self-pay

## 2013-08-06 DIAGNOSIS — Z5181 Encounter for therapeutic drug level monitoring: Secondary | ICD-10-CM

## 2013-08-06 DIAGNOSIS — I4891 Unspecified atrial fibrillation: Secondary | ICD-10-CM

## 2013-08-06 DIAGNOSIS — I749 Embolism and thrombosis of unspecified artery: Secondary | ICD-10-CM

## 2013-08-06 NOTE — Telephone Encounter (Signed)
Given verbal order as requested.

## 2013-08-06 NOTE — Telephone Encounter (Signed)
Left message for [name] [name] with verbal okay to continue Home Health PT 2 x a week for 3 weeks.

## 2013-08-06 NOTE — Telephone Encounter (Signed)
[name] [name] PT with [facility] left v/m; pt is coming up on certification and Kendell Baneroy is requesting extending PT home health 2 x a week for 3 weeks. Pt has increased her walking distance and oxygen level is not dropping as much as before but pt is not quite where she needs to be.[place] request cb.

## 2013-08-10 ENCOUNTER — Encounter: Payer: Self-pay | Admitting: Surgery

## 2013-08-11 ENCOUNTER — Encounter: Payer: Self-pay | Admitting: Cardiothoracic Surgery

## 2013-08-12 ENCOUNTER — Ambulatory Visit (INDEPENDENT_AMBULATORY_CARE_PROVIDER_SITE_OTHER): Payer: Medicare Other | Admitting: Cardiovascular Disease

## 2013-08-12 DIAGNOSIS — I4891 Unspecified atrial fibrillation: Secondary | ICD-10-CM

## 2013-08-12 DIAGNOSIS — Z5181 Encounter for therapeutic drug level monitoring: Secondary | ICD-10-CM

## 2013-08-12 DIAGNOSIS — I749 Embolism and thrombosis of unspecified artery: Secondary | ICD-10-CM

## 2013-08-12 LAB — POCT INR: INR: 3.6

## 2013-08-13 ENCOUNTER — Telehealth: Payer: Self-pay

## 2013-08-13 NOTE — Telephone Encounter (Signed)
Pt was instructed at ov on 06/24/13:  "Please take warfarin 1/2 pill (2.5 mg) once a day  Goal INR 1.8 to 2.2  We will have home nursing check your INR once a week for one month  We may not need to do INR checks after one month if INR remains low"  INR on last 4 checks: 3/15:  2.1 3/20:  1.9 3/27:  2.4 4/2:    3.6  Do you want home health to continue to monitor or defer back to PCP, as they were previously monitoring for her?

## 2013-08-16 NOTE — Telephone Encounter (Signed)
I would hold the warfarin until Friday Then start warfarin 2.5 alternating with 1/2 pill (1.25) daily Continue checks weekly

## 2013-08-17 DIAGNOSIS — L989 Disorder of the skin and subcutaneous tissue, unspecified: Secondary | ICD-10-CM

## 2013-08-17 DIAGNOSIS — I5032 Chronic diastolic (congestive) heart failure: Secondary | ICD-10-CM

## 2013-08-17 DIAGNOSIS — S272XXA Traumatic hemopneumothorax, initial encounter: Secondary | ICD-10-CM

## 2013-08-17 DIAGNOSIS — I4891 Unspecified atrial fibrillation: Secondary | ICD-10-CM

## 2013-08-17 DIAGNOSIS — I509 Heart failure, unspecified: Secondary | ICD-10-CM

## 2013-08-17 MED ORDER — WARFARIN SODIUM 2.5 MG PO TABS: 2.5000 mg | ORAL_TABLET | Freq: Every day | ORAL | Status: DC

## 2013-08-17 NOTE — Telephone Encounter (Signed)
Spoke w/ pt.  Advised her of Dr. Windell HummingbirdGollan's recommendation.   Pt states that she has coumadin 5mg , which she has been cutting in 1/2 to equal 2.5mg . She would like to clarify that she is to take 1/4 pill, as she is unsure if she can cut her pill that small.  She is agreeable to holding her coumadin until Friday and will wait back on further instructions.  Please advise.  Thank  You.

## 2013-08-17 NOTE — Telephone Encounter (Signed)
Spoke w/ Larita FifeLynn.  She reports that she is scheduled to go to pt's home on Thursday this week.  Asks that I wait a little while to call pt w/ instructions, as she does not get up this early.

## 2013-08-17 NOTE — Telephone Encounter (Signed)
Would send in new script for warfarin 2.5 mg

## 2013-08-17 NOTE — Telephone Encounter (Signed)
Spoke w/ pt.  Reviewed instructions for new warfarin coumadin.  Pt verbalizes understanding and will call w/ further questions or concerns.

## 2013-08-19 ENCOUNTER — Other Ambulatory Visit: Payer: Self-pay | Admitting: *Deleted

## 2013-08-19 ENCOUNTER — Telehealth: Payer: Self-pay | Admitting: Radiology

## 2013-08-19 ENCOUNTER — Telehealth: Payer: Self-pay | Admitting: *Deleted

## 2013-08-19 ENCOUNTER — Other Ambulatory Visit (INDEPENDENT_AMBULATORY_CARE_PROVIDER_SITE_OTHER): Payer: Medicare Other

## 2013-08-19 ENCOUNTER — Telehealth: Payer: Self-pay | Admitting: Family Medicine

## 2013-08-19 ENCOUNTER — Ambulatory Visit (INDEPENDENT_AMBULATORY_CARE_PROVIDER_SITE_OTHER): Payer: Medicare Other | Admitting: Pharmacist

## 2013-08-19 DIAGNOSIS — I4891 Unspecified atrial fibrillation: Secondary | ICD-10-CM

## 2013-08-19 DIAGNOSIS — N183 Chronic kidney disease, stage 3 unspecified: Secondary | ICD-10-CM

## 2013-08-19 DIAGNOSIS — I749 Embolism and thrombosis of unspecified artery: Secondary | ICD-10-CM

## 2013-08-19 DIAGNOSIS — D509 Iron deficiency anemia, unspecified: Secondary | ICD-10-CM

## 2013-08-19 DIAGNOSIS — Z5181 Encounter for therapeutic drug level monitoring: Secondary | ICD-10-CM

## 2013-08-19 DIAGNOSIS — M109 Gout, unspecified: Secondary | ICD-10-CM

## 2013-08-19 LAB — CBC WITH DIFFERENTIAL/PLATELET
BASOS PCT: 0.2 % (ref 0.0–3.0)
Basophils Absolute: 0 10*3/uL (ref 0.0–0.1)
EOS PCT: 2.3 % (ref 0.0–5.0)
Eosinophils Absolute: 0.4 10*3/uL (ref 0.0–0.7)
HEMATOCRIT: 36.2 % (ref 36.0–46.0)
HEMOGLOBIN: 11.4 g/dL — AB (ref 12.0–15.0)
LYMPHS ABS: 1.2 10*3/uL (ref 0.7–4.0)
Lymphocytes Relative: 6.4 % — ABNORMAL LOW (ref 12.0–46.0)
MCHC: 31.6 g/dL (ref 30.0–36.0)
MCV: 85.1 fl (ref 78.0–100.0)
Monocytes Absolute: 0.7 10*3/uL (ref 0.1–1.0)
Monocytes Relative: 3.9 % (ref 3.0–12.0)
Neutro Abs: 16.7 10*3/uL — ABNORMAL HIGH (ref 1.4–7.7)
Neutrophils Relative %: 87.2 % — ABNORMAL HIGH (ref 43.0–77.0)
PLATELETS: 486 10*3/uL — AB (ref 150.0–400.0)
RBC: 4.25 Mil/uL (ref 3.87–5.11)
RDW: 20.5 % — ABNORMAL HIGH (ref 11.5–14.6)
WBC: 19.1 10*3/uL — AB (ref 4.5–10.5)

## 2013-08-19 LAB — BASIC METABOLIC PANEL
BUN: 35 mg/dL — ABNORMAL HIGH (ref 6–23)
CALCIUM: 9.7 mg/dL (ref 8.4–10.5)
CO2: 37 meq/L — AB (ref 19–32)
Chloride: 87 mEq/L — ABNORMAL LOW (ref 96–112)
Creatinine, Ser: 1.6 mg/dL — ABNORMAL HIGH (ref 0.4–1.2)
GFR: 33.46 mL/min — ABNORMAL LOW (ref >60.00)
GLUCOSE: 86 mg/dL (ref 70–99)
Potassium: 2.6 mEq/L — CL (ref 3.5–5.1)
Sodium: 136 mEq/L (ref 135–145)

## 2013-08-19 LAB — POCT INR: INR: 1.5

## 2013-08-19 LAB — URIC ACID: Uric Acid, Serum: 10.4 mg/dL — ABNORMAL HIGH (ref 2.4–7.0)

## 2013-08-19 MED ORDER — FLECAINIDE ACETATE 100 MG PO TABS: 100.0000 mg | ORAL_TABLET | Freq: Two times a day (BID) | ORAL | Status: DC

## 2013-08-19 NOTE — Telephone Encounter (Signed)
Ms. [name] [name] notified as instructed by telephone.  She will hold her zaroxlyn and increase her K+ to 40 meq two times a day.  Appointment scheduled 08/20/2013 @ 12:15pm with [name].

## 2013-08-19 NOTE — Telephone Encounter (Signed)
Patient notified as instructed by telephone.  She states she woke up last night itching but no recent cough or fever.  She is scheduled to follow up with [name] next Tuesday 08/24/2013.

## 2013-08-19 NOTE — Telephone Encounter (Signed)
[name] called our office and told Terese DoorDonna Loring, CMA that INR today is 1.5.  It looks like Dr. Mariah MillingGollan has been monitoring her INR recently instead of [name] so I am passing info along.

## 2013-08-19 NOTE — Telephone Encounter (Signed)
Please call to ASAP. White blood cell count elevated with left shift suggesting ongoing bacterial infection.. Does she have any symptoms ie rash, cough, fever etc.  Her anemia is much improved from previous testing.

## 2013-08-19 NOTE — Telephone Encounter (Signed)
Also notify pt potassium dangerously low. Likely due to zaroxylyn... Is she taking 30 mEQ BID of potassium as instructed,, if so we will need to increase to 40 mEQ daily and hold zaroxylyn for  24 hours.

## 2013-08-19 NOTE — Telephone Encounter (Signed)
Patient notified as instructed by telephone.  She is not currently on clindamycin.  She has not had any dysuria or diarrhea.  Appointment scheduled 08/20/2013 @ 12:15pm with [name].

## 2013-08-19 NOTE — Telephone Encounter (Signed)
Critical lab WBC 19.1, results not in emr, copy printed and given to Dr.

## 2013-08-19 NOTE — Telephone Encounter (Signed)
Let pt know she needs to come in before then ( tomorrow or Monday) for a repeat CBC to verify result. Is she on clindamycin and what for? Also can we move her appt any sooner ie. tommorow? Also let me know if any symptoms develop.  Also verify no dysuria or diarrhea.

## 2013-08-19 NOTE — Telephone Encounter (Signed)
[facility] called a critical K+ - 2.6. Results given to Dr Ermalene SearingBedsole

## 2013-08-19 NOTE — Telephone Encounter (Signed)
Requested Prescriptions   Signed Prescriptions Disp Refills  . flecainide (TAMBOCOR) 100 MG tablet 60 tablet 3    Sig: Take 1 tablet (100 mg total) by mouth every 12 (twelve) hours.    Authorizing Provider: Antonieta IbaGOLLAN, TIMOTHY J    Ordering User: Kendrick FriesLOPEZ, Shawndell Varas C

## 2013-08-20 ENCOUNTER — Ambulatory Visit (INDEPENDENT_AMBULATORY_CARE_PROVIDER_SITE_OTHER): Payer: Medicare Other | Admitting: Family Medicine

## 2013-08-20 ENCOUNTER — Telehealth: Payer: Self-pay | Admitting: Family Medicine

## 2013-08-20 ENCOUNTER — Encounter: Payer: Self-pay | Admitting: Family Medicine

## 2013-08-20 VITALS — BP 122/78 | HR 74 | Temp 98.1°F | Ht 64.0 in | Wt 273.0 lb

## 2013-08-20 DIAGNOSIS — E876 Hypokalemia: Secondary | ICD-10-CM

## 2013-08-20 DIAGNOSIS — F322 Major depressive disorder, single episode, severe without psychotic features: Secondary | ICD-10-CM

## 2013-08-20 DIAGNOSIS — D72829 Elevated white blood cell count, unspecified: Secondary | ICD-10-CM

## 2013-08-20 LAB — COMPREHENSIVE METABOLIC PANEL
ALT: 15 U/L (ref 0–35)
AST: 13 U/L (ref 0–37)
Albumin: 3.2 g/dL — ABNORMAL LOW (ref 3.5–5.2)
Alkaline Phosphatase: 108 U/L (ref 39–117)
BILIRUBIN TOTAL: 0.4 mg/dL (ref 0.3–1.2)
BUN: 42 mg/dL — ABNORMAL HIGH (ref 6–23)
CO2: 39 meq/L — AB (ref 19–32)
CREATININE: 1.6 mg/dL — AB (ref 0.4–1.2)
Calcium: 10 mg/dL (ref 8.4–10.5)
Chloride: 89 mEq/L — ABNORMAL LOW (ref 96–112)
GFR: 32.05 mL/min — AB (ref >60.00)
Glucose, Bld: 99 mg/dL (ref 70–99)
Potassium: 2.6 mEq/L — CL (ref 3.5–5.1)
Sodium: 138 mEq/L (ref 135–145)
TOTAL PROTEIN: 8 g/dL (ref 6.0–8.3)

## 2013-08-20 LAB — CBC WITH DIFFERENTIAL/PLATELET
Basophils Absolute: 0 10*3/uL (ref 0.0–0.1)
Basophils Relative: 0.2 % (ref 0.0–3.0)
EOS ABS: 0.5 10*3/uL (ref 0.0–0.7)
Eosinophils Relative: 2.4 % (ref 0.0–5.0)
HEMATOCRIT: 35.1 % — AB (ref 36.0–46.0)
Hemoglobin: 11.4 g/dL — ABNORMAL LOW (ref 12.0–15.0)
LYMPHS ABS: 1.2 10*3/uL (ref 0.7–4.0)
Lymphocytes Relative: 6 % — ABNORMAL LOW (ref 12.0–46.0)
MCHC: 32.4 g/dL (ref 30.0–36.0)
MCV: 85 fl (ref 78.0–100.0)
MONO ABS: 1.2 10*3/uL — AB (ref 0.1–1.0)
Monocytes Relative: 6.1 % (ref 3.0–12.0)
Neutro Abs: 16.6 10*3/uL — ABNORMAL HIGH (ref 1.4–7.7)
Neutrophils Relative %: 85.3 % — ABNORMAL HIGH (ref 43.0–77.0)
Platelets: 443 10*3/uL — ABNORMAL HIGH (ref 150.0–400.0)
RBC: 4.13 Mil/uL (ref 3.87–5.11)
RDW: 20.2 % — ABNORMAL HIGH (ref 11.5–14.6)
WBC: 19.5 10*3/uL (ref 4.5–10.5)

## 2013-08-20 MED ORDER — ESCITALOPRAM OXALATE 10 MG PO TABS
10.0000 mg | ORAL_TABLET | Freq: Every day | ORAL | Status: DC
Start: 2013-08-20 — End: 2013-09-08

## 2013-08-20 MED ORDER — POTASSIUM CHLORIDE CRYS ER 20 MEQ PO TBCR: 50.0000 meq | EXTENDED_RELEASE_TABLET | Freq: Two times a day (BID) | ORAL | Status: DC

## 2013-08-20 NOTE — Progress Notes (Signed)
Subjective:    Patient ID: [name] [name], female    DOB: 06/15/1931, 80 y.o.   MRN: [id number]  HPI  80 year old female with complicated medical history including morbid obesity, CHF, afib on coumadin, CKD who presents for follow up.  She has routine labs checked yesterday and was found to have a K of 2.6 and wbc of 19.1 with left shift.  her hemoglobin is almost nml now at 11.4. ( has Hg 8.6 with epistaxis in 06/2013.   she  We have stopped her zaroxolyn in last day and increased her potassium bu 20 additional MEq.   She is also taking lasix 40 mg twice daily.  She had not change med lately.  Wt  274 yesterday, 273 today.    She denies rash, dysuria,  She has had  off and on cough, clear productive in last several month. No fever. No chest pain. No runny nose, no cold symptoms. Some itching in last few days.  She has leg wound on right leg.. No discharge, no odor, no redness.    Her depression is very poorly controlled. Does not care about anything, isolating herself. No motivation, has anhedonia, tearful a lot. Venlafaxine not helping any.  Wellbutrin, Cymbalta not helpful in past.   Review of Systems  Constitutional: Negative for fever and fatigue.  HENT: Negative for ear pain.   Eyes: Negative for pain.  Respiratory: Positive for shortness of breath.   Cardiovascular: Negative for chest pain and leg swelling.       Stable  Gastrointestinal: Negative for abdominal pain.       Objective:   Physical Exam  Constitutional: She is oriented to person, place, and time. Vital signs are normal. She appears well-developed and well-nourished. She is cooperative.  Non-toxic appearance. She does not appear ill. No distress.  HENT:  Head: Normocephalic.  Right Ear: Hearing, tympanic membrane, external ear and ear canal normal. Tympanic membrane is not erythematous, not retracted and not bulging.  Left Ear: Hearing, tympanic membrane, external ear and ear canal normal. Tympanic  membrane is not erythematous, not retracted and not bulging.  Nose: No mucosal edema or rhinorrhea. Right sinus exhibits no maxillary sinus tenderness and no frontal sinus tenderness. Left sinus exhibits no maxillary sinus tenderness and no frontal sinus tenderness.  Mouth/Throat: Uvula is midline, oropharynx is clear and moist and mucous membranes are normal.  Eyes: Conjunctivae, EOM and lids are normal. Pupils are equal, round, and reactive to light. Lids are everted and swept, no foreign bodies found.  Neck: Trachea normal and normal range of motion. Neck supple. Carotid bruit is not present. No mass and no thyromegaly present.  Cardiovascular: Normal rate, S1 normal, S2 normal, intact distal pulses and normal pulses.  An irregular rhythm present. Exam reveals distant heart sounds. Exam reveals no gallop and no friction rub.   No murmur heard. No peripheral edema  Pulmonary/Chest: Effort normal and breath sounds normal. Not tachypneic. No respiratory distress. She has no decreased breath sounds. She has no wheezes. She has no rhonchi. She has no rales.  Abdominal: Soft. Normal appearance and bowel sounds are normal. There is no tenderness.  Neurological: She is alert and oriented to person, place, and time.  Skin: Skin is warm, dry and intact. No rash noted.  Psychiatric: Her speech is normal and behavior is normal. Judgment and thought content normal. Her mood appears not anxious. Cognition and memory are normal. She exhibits a depressed mood. She expresses no homicidal  and no suicidal ideation.          Assessment & Plan:

## 2013-08-20 NOTE — Telephone Encounter (Signed)
Let pt know potassium is not any higher.. Send in rx to increase potassium to 50 MEQ BID until next OV on Tuesday..  Have her continue to hold metalazone  until Tuesday. Can continue lasix for now.  She needs to keep appt as scheduled on Tuesday.  Her wbc is a true measurement not lab error.. Please obtain last wbc from recent hospitalization to determine if it is from chronic inflammation from wound. Let me know before end of day.

## 2013-08-20 NOTE — Progress Notes (Signed)
Pre visit review using our clinic review tool, if applicable. No additional management support is needed unless otherwise documented below in the visit note. 

## 2013-08-20 NOTE — Telephone Encounter (Signed)
Noted  

## 2013-08-20 NOTE — Telephone Encounter (Signed)
[name] [name] calling to report critical lab: K 2.6

## 2013-08-20 NOTE — Telephone Encounter (Signed)
We dosed an INR of 1.5 on 08/19/13, next recheck 08/23/13. Anticoagulation note in Epic.

## 2013-08-20 NOTE — Telephone Encounter (Signed)
Critical lab WBC is 19.5

## 2013-08-20 NOTE — Telephone Encounter (Addendum)
[name] [name] notified as instructed by telephone.  K-Dur 20 meq take 2.5 tablets (50meq) by mouth two times a day sent to [facility] [place]. [name] reviewed [facility] drawn on 06/17/2013.  WBC was 18.3 at that time.  [name] [name] feels elevated WBC is from chronic inflammation from wound.  Patient will follow up as scheduled on 08/24/2013.  Patient also notified it is okay for her to start the Lexapro.

## 2013-08-20 NOTE — Patient Instructions (Signed)
We will call with lab results tonight. Once lab issue settled... Can stop venlafaxine and change to lexapro 10 mg daily. If no SE after  1 week increase to 20 mg daily. Follow up in 1 month 30 min.

## 2013-08-23 ENCOUNTER — Ambulatory Visit (INDEPENDENT_AMBULATORY_CARE_PROVIDER_SITE_OTHER): Payer: Medicare Other | Admitting: Family Medicine

## 2013-08-23 ENCOUNTER — Other Ambulatory Visit: Payer: Self-pay | Admitting: Family Medicine

## 2013-08-23 ENCOUNTER — Telehealth: Payer: Self-pay | Admitting: Cardiovascular Disease

## 2013-08-23 DIAGNOSIS — I749 Embolism and thrombosis of unspecified artery: Secondary | ICD-10-CM

## 2013-08-23 DIAGNOSIS — Z5181 Encounter for therapeutic drug level monitoring: Secondary | ICD-10-CM

## 2013-08-23 DIAGNOSIS — I4891 Unspecified atrial fibrillation: Secondary | ICD-10-CM

## 2013-08-23 LAB — POCT INR: INR: 1.7

## 2013-08-23 MED ORDER — WARFARIN SODIUM 2.5 MG PO TABS: ORAL_TABLET | ORAL | Status: DC

## 2013-08-23 NOTE — Telephone Encounter (Signed)
[name], advised to call results of INR checks [facility] for Coumadin management and monitoring from now on.

## 2013-08-23 NOTE — Telephone Encounter (Signed)
New message     Returning [name] [name]'s call

## 2013-08-24 ENCOUNTER — Ambulatory Visit (INDEPENDENT_AMBULATORY_CARE_PROVIDER_SITE_OTHER): Payer: Medicare Other | Admitting: Family Medicine

## 2013-08-24 ENCOUNTER — Encounter: Payer: Self-pay | Admitting: Family Medicine

## 2013-08-24 VITALS — BP 128/68 | HR 78 | Temp 97.4°F | Wt 277.5 lb

## 2013-08-24 DIAGNOSIS — F322 Major depressive disorder, single episode, severe without psychotic features: Secondary | ICD-10-CM

## 2013-08-24 DIAGNOSIS — E876 Hypokalemia: Secondary | ICD-10-CM

## 2013-08-24 DIAGNOSIS — I10 Essential (primary) hypertension: Secondary | ICD-10-CM

## 2013-08-24 DIAGNOSIS — I5032 Chronic diastolic (congestive) heart failure: Secondary | ICD-10-CM

## 2013-08-24 LAB — POTASSIUM: Potassium: 3.6 mEq/L (ref 3.5–5.1)

## 2013-08-24 NOTE — Assessment & Plan Note (Signed)
Beginning to be fluid overloaded. Hopefully potassium improved and we can start back metalozone

## 2013-08-24 NOTE — Progress Notes (Signed)
Pre visit review using our clinic review tool, if applicable. No additional management support is needed unless otherwise documented below in the visit note. 

## 2013-08-24 NOTE — Progress Notes (Signed)
   Subjective:    Patient ID: [name] [name], female    DOB: 08/03/1931, 80 y.o.   MRN: [id number]  HPI  80 year old female presents for 4 day follow up after  Severe hypokalemia and leukocytosis noted on labs.  leukocytosis appears to be chronic likely from ongoing wound ( stable over past month.  She has no evidence of  Infection.  No fever. Feeling well overall.  She has now increased her potassium dose  (50 mEQ) and held her metolazone. She remains on lasix. She has has 4 lb weight gain in 4 days off of diuretic. Wt Readings from Last 3 Encounters:  08/24/13 277 lb 8 oz (125.873 kg)  08/20/13 273 lb (123.832 kg)  06/24/13 281 lb (127.461 kg)   She has now stopped venlafaxine and changed to lexapro daily.  She reports no SE at this time.    She has benifited from home PT.  Rx written to continue home health PT. She remains homebound   Review of Systems  Constitutional: Negative for fever and fatigue.  HENT: Negative for ear pain.   Eyes: Negative for pain.  Respiratory: Negative for chest tightness and shortness of breath.   Cardiovascular: Positive for leg swelling. Negative for chest pain and palpitations.  Gastrointestinal: Negative for abdominal pain.  Genitourinary: Negative for dysuria.       Objective:   Physical Exam  Constitutional: Vital signs are normal. She appears well-developed and well-nourished. She is cooperative.  Non-toxic appearance. She does not appear ill. No distress.  HENT:  Head: Normocephalic.  Right Ear: Hearing, tympanic membrane, external ear and ear canal normal. Tympanic membrane is not erythematous, not retracted and not bulging.  Left Ear: Hearing, tympanic membrane, external ear and ear canal normal. Tympanic membrane is not erythematous, not retracted and not bulging.  Nose: No mucosal edema or rhinorrhea. Right sinus exhibits no maxillary sinus tenderness and no frontal sinus tenderness. Left sinus exhibits no maxillary sinus tenderness  and no frontal sinus tenderness.  Mouth/Throat: Uvula is midline, oropharynx is clear and moist and mucous membranes are normal.  Eyes: Conjunctivae, EOM and lids are normal. Pupils are equal, round, and reactive to light. Lids are everted and swept, no foreign bodies found.  Neck: Trachea normal and normal range of motion. Neck supple. Carotid bruit is not present. No mass and no thyromegaly present.  Cardiovascular: Normal rate, regular rhythm, S1 normal, S2 normal, normal heart sounds, intact distal pulses and normal pulses.  Exam reveals no gallop and no friction rub.   No murmur heard. Slight edema B  Pulmonary/Chest: Effort normal and breath sounds normal. Not tachypneic. No respiratory distress. She has no decreased breath sounds. She has no wheezes. She has no rhonchi. She has no rales.  Abdominal: Soft. Normal appearance and bowel sounds are normal. There is no tenderness.  Neurological: She is alert.  Skin: Skin is warm, dry and intact. No rash noted.  Right leg wound covered with bandage, underneath healing wound, with granulation tissue and bleeding with debriding dressing.  Psychiatric: Her speech is normal and behavior is normal. Judgment and thought content normal. Her mood appears not anxious. Cognition and memory are normal. She does not exhibit a depressed mood.          Assessment & Plan:

## 2013-08-24 NOTE — Patient Instructions (Signed)
Follow up in 1-2 months to follow up on mood and for medicare wellness fasting labs prior.  Stop at lab way out. Call sooner if mood is not improving after 4 weeks.

## 2013-08-24 NOTE — Assessment & Plan Note (Signed)
Due for referral.

## 2013-08-25 NOTE — Addendum Note (Signed)
Addended by: Damita LackLORING, Tavon Magnussen S on: 08/25/2013 11:29 AM   Modules accepted: Orders

## 2013-08-30 ENCOUNTER — Ambulatory Visit (INDEPENDENT_AMBULATORY_CARE_PROVIDER_SITE_OTHER): Payer: Medicare Other | Admitting: General Practice

## 2013-08-30 DIAGNOSIS — I4891 Unspecified atrial fibrillation: Secondary | ICD-10-CM

## 2013-08-30 DIAGNOSIS — Z5181 Encounter for therapeutic drug level monitoring: Secondary | ICD-10-CM

## 2013-08-30 DIAGNOSIS — I749 Embolism and thrombosis of unspecified artery: Secondary | ICD-10-CM

## 2013-08-30 LAB — POCT INR: INR: 1.2

## 2013-08-30 NOTE — Progress Notes (Signed)
Pre visit review using our clinic review tool, if applicable. No additional management support is needed unless otherwise documented below in the visit note. 

## 2013-08-31 ENCOUNTER — Emergency Department (HOSPITAL_COMMUNITY): Payer: Medicare Other

## 2013-08-31 ENCOUNTER — Ambulatory Visit (INDEPENDENT_AMBULATORY_CARE_PROVIDER_SITE_OTHER): Payer: Medicare Other | Admitting: Family Medicine

## 2013-08-31 ENCOUNTER — Encounter: Payer: Self-pay | Admitting: Family Medicine

## 2013-08-31 ENCOUNTER — Telehealth: Payer: Self-pay

## 2013-08-31 ENCOUNTER — Encounter (HOSPITAL_COMMUNITY): Payer: Self-pay | Admitting: Emergency Medicine

## 2013-08-31 ENCOUNTER — Inpatient Hospital Stay (HOSPITAL_COMMUNITY)
Admission: EM | Admit: 2013-08-31 | Discharge: 2013-09-06 | DRG: 291 | Disposition: A | Discharge status: Home Health | Payer: Medicare Other | Attending: Internal Medicine | Admitting: Internal Medicine

## 2013-08-31 VITALS — BP 94/48 | HR 49 | Temp 97.6°F | Ht 64.0 in

## 2013-08-31 DIAGNOSIS — I2699 Other pulmonary embolism without acute cor pulmonale: Secondary | ICD-10-CM

## 2013-08-31 DIAGNOSIS — Z7901 Long term (current) use of anticoagulants: Secondary | ICD-10-CM

## 2013-08-31 DIAGNOSIS — I749 Embolism and thrombosis of unspecified artery: Secondary | ICD-10-CM

## 2013-08-31 DIAGNOSIS — J9600 Acute respiratory failure, unspecified whether with hypoxia or hypercapnia: Secondary | ICD-10-CM

## 2013-08-31 DIAGNOSIS — K761 Chronic passive congestion of liver: Secondary | ICD-10-CM | POA: Diagnosis present

## 2013-08-31 DIAGNOSIS — D509 Iron deficiency anemia, unspecified: Secondary | ICD-10-CM

## 2013-08-31 DIAGNOSIS — I5032 Chronic diastolic (congestive) heart failure: Secondary | ICD-10-CM

## 2013-08-31 DIAGNOSIS — I509 Heart failure, unspecified: Secondary | ICD-10-CM

## 2013-08-31 DIAGNOSIS — M109 Gout, unspecified: Secondary | ICD-10-CM

## 2013-08-31 DIAGNOSIS — L988 Other specified disorders of the skin and subcutaneous tissue: Secondary | ICD-10-CM | POA: Diagnosis present

## 2013-08-31 DIAGNOSIS — I4891 Unspecified atrial fibrillation: Secondary | ICD-10-CM

## 2013-08-31 DIAGNOSIS — Z86711 Personal history of pulmonary embolism: Secondary | ICD-10-CM

## 2013-08-31 DIAGNOSIS — Z5181 Encounter for therapeutic drug level monitoring: Secondary | ICD-10-CM

## 2013-08-31 DIAGNOSIS — I2510 Atherosclerotic heart disease of native coronary artery without angina pectoris: Secondary | ICD-10-CM | POA: Diagnosis present

## 2013-08-31 DIAGNOSIS — M1A9XX0 Chronic gout, unspecified, without tophus (tophi): Secondary | ICD-10-CM | POA: Diagnosis present

## 2013-08-31 DIAGNOSIS — N179 Acute kidney failure, unspecified: Secondary | ICD-10-CM

## 2013-08-31 DIAGNOSIS — T81718A Complication of other artery following a procedure, not elsewhere classified, initial encounter: Secondary | ICD-10-CM

## 2013-08-31 DIAGNOSIS — J9601 Acute respiratory failure with hypoxia: Secondary | ICD-10-CM

## 2013-08-31 DIAGNOSIS — I5033 Acute on chronic diastolic (congestive) heart failure: Secondary | ICD-10-CM | POA: Diagnosis present

## 2013-08-31 DIAGNOSIS — Z8669 Personal history of other diseases of the nervous system and sense organs: Secondary | ICD-10-CM

## 2013-08-31 DIAGNOSIS — N039 Chronic nephritic syndrome with unspecified morphologic changes: Secondary | ICD-10-CM

## 2013-08-31 DIAGNOSIS — E7800 Pure hypercholesterolemia, unspecified: Secondary | ICD-10-CM

## 2013-08-31 DIAGNOSIS — N184 Chronic kidney disease, stage 4 (severe): Secondary | ICD-10-CM | POA: Diagnosis present

## 2013-08-31 DIAGNOSIS — R079 Chest pain, unspecified: Secondary | ICD-10-CM

## 2013-08-31 DIAGNOSIS — Z7982 Long term (current) use of aspirin: Secondary | ICD-10-CM

## 2013-08-31 DIAGNOSIS — I959 Hypotension, unspecified: Secondary | ICD-10-CM | POA: Diagnosis present

## 2013-08-31 DIAGNOSIS — N183 Chronic kidney disease, stage 3 unspecified: Secondary | ICD-10-CM

## 2013-08-31 DIAGNOSIS — R55 Syncope and collapse: Secondary | ICD-10-CM

## 2013-08-31 DIAGNOSIS — B0229 Other postherpetic nervous system involvement: Secondary | ICD-10-CM

## 2013-08-31 DIAGNOSIS — F322 Major depressive disorder, single episode, severe without psychotic features: Secondary | ICD-10-CM

## 2013-08-31 DIAGNOSIS — E785 Hyperlipidemia, unspecified: Secondary | ICD-10-CM | POA: Diagnosis present

## 2013-08-31 DIAGNOSIS — Z87898 Personal history of other specified conditions: Secondary | ICD-10-CM

## 2013-08-31 DIAGNOSIS — J9611 Chronic respiratory failure with hypoxia: Secondary | ICD-10-CM

## 2013-08-31 DIAGNOSIS — Z86718 Personal history of other venous thrombosis and embolism: Secondary | ICD-10-CM

## 2013-08-31 DIAGNOSIS — D72829 Elevated white blood cell count, unspecified: Secondary | ICD-10-CM | POA: Diagnosis present

## 2013-08-31 DIAGNOSIS — Z6841 Body Mass Index (BMI) 40.0 and over, adult: Secondary | ICD-10-CM

## 2013-08-31 DIAGNOSIS — R0602 Shortness of breath: Secondary | ICD-10-CM

## 2013-08-31 DIAGNOSIS — N189 Chronic kidney disease, unspecified: Principal | ICD-10-CM

## 2013-08-31 DIAGNOSIS — J449 Chronic obstructive pulmonary disease, unspecified: Secondary | ICD-10-CM | POA: Diagnosis present

## 2013-08-31 DIAGNOSIS — Z9981 Dependence on supplemental oxygen: Secondary | ICD-10-CM

## 2013-08-31 DIAGNOSIS — E876 Hypokalemia: Secondary | ICD-10-CM

## 2013-08-31 DIAGNOSIS — J309 Allergic rhinitis, unspecified: Secondary | ICD-10-CM

## 2013-08-31 DIAGNOSIS — I714 Abdominal aortic aneurysm, without rupture, unspecified: Secondary | ICD-10-CM

## 2013-08-31 DIAGNOSIS — G4733 Obstructive sleep apnea (adult) (pediatric): Secondary | ICD-10-CM

## 2013-08-31 DIAGNOSIS — J9620 Acute and chronic respiratory failure, unspecified whether with hypoxia or hypercapnia: Secondary | ICD-10-CM | POA: Diagnosis present

## 2013-08-31 DIAGNOSIS — G4736 Sleep related hypoventilation in conditions classified elsewhere: Secondary | ICD-10-CM

## 2013-08-31 DIAGNOSIS — Z8249 Family history of ischemic heart disease and other diseases of the circulatory system: Secondary | ICD-10-CM

## 2013-08-31 DIAGNOSIS — D631 Anemia in chronic kidney disease: Secondary | ICD-10-CM | POA: Diagnosis present

## 2013-08-31 DIAGNOSIS — I130 Hypertensive heart and chronic kidney disease with heart failure and stage 1 through stage 4 chronic kidney disease, or unspecified chronic kidney disease: Principal | ICD-10-CM | POA: Diagnosis present

## 2013-08-31 DIAGNOSIS — S81809A Unspecified open wound, unspecified lower leg, initial encounter: Secondary | ICD-10-CM

## 2013-08-31 DIAGNOSIS — J4489 Other specified chronic obstructive pulmonary disease: Secondary | ICD-10-CM | POA: Diagnosis present

## 2013-08-31 DIAGNOSIS — Z79899 Other long term (current) drug therapy: Secondary | ICD-10-CM

## 2013-08-31 DIAGNOSIS — R0789 Other chest pain: Secondary | ICD-10-CM | POA: Diagnosis not present

## 2013-08-31 DIAGNOSIS — I480 Paroxysmal atrial fibrillation: Secondary | ICD-10-CM | POA: Diagnosis present

## 2013-08-31 DIAGNOSIS — I10 Essential (primary) hypertension: Secondary | ICD-10-CM

## 2013-08-31 LAB — TROPONIN I: Troponin I: <0.3 ng/mL (ref <0.30)

## 2013-08-31 LAB — COMPREHENSIVE METABOLIC PANEL
ALBUMIN: 2.9 g/dL — AB (ref 3.5–5.2)
ALT: 278 U/L — ABNORMAL HIGH (ref 0–35)
AST: 372 U/L — ABNORMAL HIGH (ref 0–37)
Alkaline Phosphatase: 107 U/L (ref 39–117)
BILIRUBIN TOTAL: 0.3 mg/dL (ref 0.3–1.2)
BUN: 50 mg/dL — AB (ref 6–23)
CHLORIDE: 88 meq/L — AB (ref 96–112)
CO2: 23 meq/L (ref 19–32)
CREATININE: 2.84 mg/dL — AB (ref 0.50–1.10)
Calcium: 9.3 mg/dL (ref 8.4–10.5)
GFR calc Af Amer: 17 mL/min — ABNORMAL LOW (ref >90)
GFR, EST NON AFRICAN AMERICAN: 15 mL/min — AB (ref >90)
Glucose, Bld: 115 mg/dL — ABNORMAL HIGH (ref 70–99)
Potassium: 4.9 mEq/L (ref 3.7–5.3)
Sodium: 133 mEq/L — ABNORMAL LOW (ref 137–147)
Total Protein: 7.8 g/dL (ref 6.0–8.3)

## 2013-08-31 LAB — CBC
HEMATOCRIT: 35.5 % — AB (ref 36.0–46.0)
Hemoglobin: 10.8 g/dL — ABNORMAL LOW (ref 12.0–15.0)
MCH: 27.8 pg (ref 26.0–34.0)
MCHC: 30.4 g/dL (ref 30.0–36.0)
MCV: 91.3 fL (ref 78.0–100.0)
Platelets: 338 10*3/uL (ref 150–400)
RBC: 3.89 MIL/uL (ref 3.87–5.11)
RDW: 21.5 % — AB (ref 11.5–15.5)
WBC: 16.9 10*3/uL — AB (ref 4.0–10.5)

## 2013-08-31 LAB — PROTIME-INR
INR: 1.22 (ref 0.00–1.49)
PROTHROMBIN TIME: 15.1 s (ref 11.6–15.2)

## 2013-08-31 LAB — URINALYSIS, ROUTINE W REFLEX MICROSCOPIC
BILIRUBIN URINE: NEGATIVE
Glucose, UA: NEGATIVE mg/dL
Hgb urine dipstick: NEGATIVE
Ketones, ur: NEGATIVE mg/dL
LEUKOCYTES UA: NEGATIVE
NITRITE: NEGATIVE
Protein, ur: NEGATIVE mg/dL
Specific Gravity, Urine: 1.015 (ref 1.005–1.030)
UROBILINOGEN UA: 0.2 mg/dL (ref 0.0–1.0)
pH: 5 (ref 5.0–8.0)

## 2013-08-31 LAB — MRSA PCR SCREENING: MRSA by PCR: NEGATIVE

## 2013-08-31 LAB — GLUCOSE, CAPILLARY: GLUCOSE-CAPILLARY: 145 mg/dL — AB (ref 70–99)

## 2013-08-31 LAB — TSH: TSH: 5.92 u[IU]/mL — AB (ref 0.350–4.500)

## 2013-08-31 LAB — HEMOGLOBIN A1C
HEMOGLOBIN A1C: 5.6 % (ref <5.7)
Mean Plasma Glucose: 114 mg/dL (ref <117)

## 2013-08-31 LAB — I-STAT TROPONIN, ED: TROPONIN I, POC: 0.07 ng/mL (ref 0.00–0.08)

## 2013-08-31 LAB — PRO B NATRIURETIC PEPTIDE: Pro B Natriuretic peptide (BNP): 16475 pg/mL — ABNORMAL HIGH (ref 0–450)

## 2013-08-31 MED ORDER — METOLAZONE 2.5 MG PO TABS
2.5000 mg | ORAL_TABLET | Freq: Every day | ORAL | Status: DC
  Administered 2013-09-01 – 2013-09-02 (×2): 2.5 mg via ORAL
  Filled 2013-08-31 (×3): qty 1

## 2013-08-31 MED ORDER — FLECAINIDE ACETATE 100 MG PO TABS
100.0000 mg | ORAL_TABLET | Freq: Two times a day (BID) | ORAL | Status: DC
  Administered 2013-08-31 – 2013-09-06 (×12): 100 mg via ORAL
  Filled 2013-08-31 (×13): qty 1

## 2013-08-31 MED ORDER — ASPIRIN 81 MG PO CHEW
81.0000 mg | CHEWABLE_TABLET | Freq: Every day | ORAL | Status: DC
  Administered 2013-09-01 – 2013-09-06 (×6): 81 mg via ORAL
  Filled 2013-08-31 (×6): qty 1

## 2013-08-31 MED ORDER — ACETAMINOPHEN 650 MG RE SUPP: 650.0000 mg | Freq: Four times a day (QID) | RECTAL | Status: DC | PRN

## 2013-08-31 MED ORDER — FERROUS SULFATE 325 (65 FE) MG PO TABS
325.0000 mg | ORAL_TABLET | Freq: Every day | ORAL | Status: DC
  Administered 2013-09-01 – 2013-09-06 (×6): 325 mg via ORAL
  Filled 2013-08-31 (×7): qty 1

## 2013-08-31 MED ORDER — ONDANSETRON HCL 4 MG PO TABS: 4.0000 mg | ORAL_TABLET | Freq: Four times a day (QID) | ORAL | Status: DC | PRN

## 2013-08-31 MED ORDER — FUROSEMIDE 10 MG/ML IJ SOLN
40.0000 mg | Freq: Once | INTRAMUSCULAR | Status: AC
  Administered 2013-08-31: 40 mg via INTRAVENOUS
  Filled 2013-08-31: qty 4

## 2013-08-31 MED ORDER — ASPIRIN 81 MG PO TBEC: 81.0000 mg | DELAYED_RELEASE_TABLET | Freq: Every day | ORAL | Status: DC

## 2013-08-31 MED ORDER — HEPARIN (PORCINE) IN NACL 100-0.45 UNIT/ML-% IJ SOLN
1950.0000 [IU]/h | INTRAMUSCULAR | Status: DC
Start: 2013-08-31 — End: 2013-09-03
  Administered 2013-08-31: 1250 [IU]/h via INTRAVENOUS
  Administered 2013-09-01: 1450 [IU]/h via INTRAVENOUS
  Administered 2013-09-01: 1700 [IU]/h via INTRAVENOUS
  Administered 2013-09-01: 1450 [IU]/h via INTRAVENOUS
  Administered 2013-09-02 – 2013-09-03 (×3): 1950 [IU]/h via INTRAVENOUS
  Filled 2013-08-31 (×8): qty 250

## 2013-08-31 MED ORDER — DILTIAZEM HCL ER COATED BEADS 120 MG PO CP24
120.0000 mg | ORAL_CAPSULE | Freq: Every day | ORAL | Status: DC
  Administered 2013-09-01 – 2013-09-06 (×6): 120 mg via ORAL
  Filled 2013-08-31 (×6): qty 1

## 2013-08-31 MED ORDER — IPRATROPIUM-ALBUTEROL 0.5-2.5 (3) MG/3ML IN SOLN
3.0000 mL | RESPIRATORY_TRACT | Status: DC
Start: 2013-08-31 — End: 2013-09-01
  Administered 2013-08-31 – 2013-09-01 (×4): 3 mL via RESPIRATORY_TRACT
  Filled 2013-08-31 (×5): qty 3

## 2013-08-31 MED ORDER — COLCHICINE 0.6 MG PO TABS
0.6000 mg | ORAL_TABLET | Freq: Every day | ORAL | Status: DC | PRN
  Administered 2013-09-01: 0.6 mg via ORAL
  Filled 2013-08-31: qty 2

## 2013-08-31 MED ORDER — FUROSEMIDE 10 MG/ML IJ SOLN
60.0000 mg | Freq: Every day | INTRAMUSCULAR | Status: DC
  Administered 2013-09-01: 60 mg via INTRAVENOUS
  Filled 2013-08-31: qty 6

## 2013-08-31 MED ORDER — WARFARIN - PHARMACIST DOSING INPATIENT
Freq: Every day | Status: DC
  Administered 2013-09-03: 18:00:00

## 2013-08-31 MED ORDER — ATORVASTATIN CALCIUM 10 MG PO TABS
10.0000 mg | ORAL_TABLET | Freq: Every day | ORAL | Status: DC
  Administered 2013-09-01 – 2013-09-06 (×6): 10 mg via ORAL
  Filled 2013-08-31 (×6): qty 1

## 2013-08-31 MED ORDER — METHYLPREDNISOLONE SODIUM SUCC 125 MG IJ SOLR
60.0000 mg | INTRAMUSCULAR | Status: AC
  Administered 2013-08-31: 60 mg via INTRAVENOUS
  Filled 2013-08-31: qty 2

## 2013-08-31 MED ORDER — ACETAMINOPHEN 325 MG PO TABS: 650.0000 mg | ORAL_TABLET | Freq: Four times a day (QID) | ORAL | Status: DC | PRN

## 2013-08-31 MED ORDER — ESCITALOPRAM OXALATE 10 MG PO TABS
10.0000 mg | ORAL_TABLET | Freq: Every day | ORAL | Status: DC
  Administered 2013-09-01 – 2013-09-06 (×6): 10 mg via ORAL
  Filled 2013-08-31 (×6): qty 1

## 2013-08-31 MED ORDER — WARFARIN SODIUM 5 MG PO TABS
5.0000 mg | ORAL_TABLET | Freq: Once | ORAL | Status: AC
  Administered 2013-08-31: 5 mg via ORAL
  Filled 2013-08-31: qty 1

## 2013-08-31 MED ORDER — IPRATROPIUM-ALBUTEROL 0.5-2.5 (3) MG/3ML IN SOLN: 3.0000 mL | RESPIRATORY_TRACT | Status: DC | PRN

## 2013-08-31 MED ORDER — ESCITALOPRAM OXALATE 10 MG PO TABS: 10.0000 mg | ORAL_TABLET | Freq: Every day | ORAL | Status: DC

## 2013-08-31 MED ORDER — ONDANSETRON HCL 4 MG/2ML IJ SOLN: 4.0000 mg | Freq: Four times a day (QID) | INTRAMUSCULAR | Status: DC | PRN

## 2013-08-31 NOTE — Telephone Encounter (Signed)
Noted  

## 2013-08-31 NOTE — ED Notes (Signed)
Attempted report x 2 

## 2013-08-31 NOTE — ED Notes (Signed)
Pt had one reported syncopal episode earlier today and refused EMS transportation due to dr's appt this afternoon.

## 2013-08-31 NOTE — Progress Notes (Addendum)
ANTICOAGULATION CONSULT NOTE - Initial Consult  Pharmacy Consult for Warfarin with Heparin [facility]  Indication: atrial fibrillation  Allergies  Allergen Reactions  . Prednisone     Eyes swelling    Patient Measurements:   Heparin Dosing Weight: 86 kg   Vital Signs: Temp: 97.5 F (36.4 C) (04/21 1605) Temp src: Temporal (04/21 1605) BP: 129/66 mmHg (04/21 1800) Pulse Rate: 49 (04/21 1800)  Labs:  Recent Labs  08/30/13 1501 08/31/13 1631  HGB  --  10.8*  HCT  --  35.5*  PLT  --  338  LABPROT  --  15.1  INR 1.2 1.22  CREATININE  --  2.84*    The CrCl is unknown because both a height and weight (above a minimum accepted value) are required for this calculation.   Medical History: Past Medical History  Diagnosis Date  . Congestive heart failure, unspecified     a. ECHO 6/0: EF 55-60%, mild LVH, mildly dilated RV w. mildly dec fx, RVSP 67  . Chronic atrial fibrillation     failed cardioversion in past  -repeat DC-CV on October 5th, 2010  . Personal history of DVT (deep vein thrombosis) 2008  . Obstructive sleep apnea   . HTN (hypertension)   . Morbid obesity   . Shingles     with postherpetic neuraigia  . GERD (gastroesophageal reflux disease)   . Hyperlipidemia   . Meningitis 1950s  . Gallstones     s/p cholecystectomy  . CAD (coronary artery disease)     nonobstructive by cath 8/10 - LAD 40-50% w mild PAH mean 29 w PVR 3.2 Woods  . AAA (abdominal aortic aneurysm)     small  . PE (pulmonary embolism)     history of  . Anemia     Medications:   (Not in a hospital admission)  Assessment: [age] YOF with Afib on coumadin prior to admission. INR on admission is sub-therapeutic at 1.22. Pharmacy to resume Coumadin with heparin drip till INR > 2. H/H and Plt wnl. Last dose of coumadin was yesterday.   Home Coumadin dose: Alternate Coumadin 1.25 mg and 2.5 mg   Goal of Therapy:  INR 2-3 Monitor platelets by anticoagulation protocol: Yes   Plan:  1)  Warfarin 5 mg x 1 dose today  2) Heparin infusion at 1250 units/hr. No bolus. D/c heparin infusion when INR > 2.  3) Monitor daily INR, CBC, and s/s of bleeding  4) 8 hour HL   Vinnie LevelBenjamin Zekiel Torian, PharmD.  Clinical Pharmacist Pager [phone]

## 2013-08-31 NOTE — ED Provider Notes (Signed)
CSN: [id number]     Arrival date & time 08/31/13  1556 History   First MD Initiated Contact with Patient 08/31/13 1607     Chief Complaint  Patient presents with  . Shortness of Breath  . Hypotension     (Consider location/radiation/quality/duration/timing/severity/associated sxs/prior Treatment) The history is provided by the patient and medical records.   history of present illness: 80 year old female who presents from PCP office with concern for shortness of breath and hypotension. Patient has chronic shortness of breath and CHF.  She wears 3 L nasal cannula baseline. She has had worsening dyspnea over the course of the past few days. She denies any history of chest pain. This morning while walking to the car to go to her PCPs office she became lightheaded and had a syncopal episode. At the PCPs office she was found to be satting 79% on her baseline 3 L nasal cannula, and reportedly had blood pressure of 94/48. She was given albuterol treatment in route and oxygen saturation improved to 98% with EMS. She denies any recent cough or fever. She held her diuretic for 3 days last week do to hypokalemia. Since that time she's had worsening weight gain associated with her shortness of breath.  Past Medical History  Diagnosis Date  . Congestive heart failure, unspecified     a. ECHO 6/0: EF 55-60%, mild LVH, mildly dilated RV w. mildly dec fx, RVSP 67  . Chronic atrial fibrillation     failed cardioversion in past  -repeat DC-CV on October 5th, 2010  . Personal history of DVT (deep vein thrombosis) 2008  . Obstructive sleep apnea   . HTN (hypertension)   . Morbid obesity   . Shingles     with postherpetic neuraigia  . GERD (gastroesophageal reflux disease)   . Hyperlipidemia   . Meningitis 1950s  . Gallstones     s/p cholecystectomy  . CAD (coronary artery disease)     nonobstructive by cath 8/10 - LAD 40-50% w mild PAH mean 29 w PVR 3.2 [place]  . AAA (abdominal aortic aneurysm)     small   . PE (pulmonary embolism)     history of  . Anemia    Past Surgical History  Procedure Laterality Date  . Appendectomy  1947  . Tonsillectomy  1946  . Hospitalized with meningitis at [facility]  1960  . Cholecystectomy    . Cardioversion  01/2007    x2   Family History  Problem Relation Age of Onset  . Heart failure Mother   . Depression Mother   . Ovarian cancer Paternal Grandmother   . Heart disease Paternal Grandfather   . Obesity Other    History  Substance Use Topics  . Smoking status: Never Smoker   . Smokeless tobacco: Never Used  . Alcohol Use: Yes     Comment: occasional   OB History   Grav Para Term Preterm Abortions TAB SAB Ect Mult Living                 Review of Systems  Constitutional: Positive for unexpected weight change (weight gain). Negative for fever and chills.  HENT: Negative for congestion.   Eyes: Negative for pain.  Respiratory: Positive for shortness of breath. Negative for cough.   Cardiovascular: Positive for leg swelling. Negative for chest pain and palpitations.  Gastrointestinal: Negative for nausea, vomiting, abdominal pain, diarrhea and constipation.  Genitourinary: Negative for dysuria, frequency, decreased urine volume and difficulty urinating.  Musculoskeletal: Negative  for back pain.  Skin: Negative for rash and wound.  Neurological: Positive for syncope. Negative for headaches.  All other systems reviewed and are negative.     Allergies  Prednisone  Home Medications   Prior to Admission medications   Medication Sig Start Date End Date Taking? Authorizing Provider  allopurinol (ZYLOPRIM) 100 MG tablet Take 1 tablet (100 mg total) by mouth daily. 06/24/13   Amy Michelle NasutiE Bedsole, MD  Ascorbic Acid (VITAMIN C PO) Take 1 tablet by mouth daily.    Historical Provider, MD  aspirin (ASPIR-81) 81 MG EC tablet Take 81 mg by mouth daily.      Historical Provider, MD  atorvastatin (LIPITOR) 10 MG tablet Take 1 tablet (10 mg total) by  mouth daily. Please schedule a physical, having fasting labs prior, for any additional refills prior. 05/28/13   Amy Michelle NasutiE Bedsole, MD  cetirizine (ZYRTEC ALLERGY) 10 MG tablet Take 10 mg by mouth daily.      Historical Provider, MD  colchicine 0.6 MG tablet If gout flare occurs.. Start 2 tabs x 1 then 1 tab two hours later, then gfo to 1 tab po daily until flare is gone. 07/02/13   Amy Michelle NasutiE Bedsole, MD  diltiazem (CARDIZEM CD) 120 MG 24 hr capsule Take 1 capsule (120 mg total) by mouth daily. 02/18/13   Antonieta Ibaimothy J Gollan, MD  escitalopram (LEXAPRO) 10 MG tablet Take 1 tablet (10 mg total) by mouth daily. 08/20/13   Amy Michelle NasutiE Bedsole, MD  ferrous sulfate 325 (65 FE) MG tablet Take 1 tablet (325 mg total) by mouth daily with breakfast. 06/24/13   Amy Michelle NasutiE Bedsole, MD  flecainide (TAMBOCOR) 100 MG tablet Take 1 tablet (100 mg total) by mouth every 12 (twelve) hours. 08/19/13   Antonieta Ibaimothy J Gollan, MD  furosemide (LASIX) 40 MG tablet Take 1 tablet (40 mg total) by mouth 2 (two) times daily as needed. 06/25/13   Antonieta Ibaimothy J Gollan, MD  ipratropium (ATROVENT HFA) 17 MCG/ACT inhaler Inhale 2 puffs into the lungs 4 (four) times daily. 07/07/13   Amy Michelle NasutiE Bedsole, MD  metolazone (ZAROXOLYN) 2.5 MG tablet Take 1 tablet (2.5 mg total) by mouth daily. 07/02/13 07/02/14  Antonieta Ibaimothy J Gollan, MD  Multiple Vitamin (MULTIVITAMIN) capsule Take 1 capsule by mouth daily.    Historical Provider, MD  Multiple Vitamins-Minerals (ZINC PO) Take 1 tablet by mouth daily.    Historical Provider, MD  NON FORMULARY Oxygen 2.5 liters daily    Historical Provider, MD  oxyCODONE (OXYCONTIN) 10 MG 12 hr tablet Take 1 tablet (10 mg total) by mouth every 12 (twelve) hours as needed for pain. 01/15/13   Amy Michelle NasutiE Bedsole, MD  potassium chloride SA (K-DUR,KLOR-CON) 20 MEQ tablet Take 40 mEq by mouth 2 (two) times daily. 08/20/13   Amy Michelle NasutiE Bedsole, MD  SENNA CO by Combination route daily.    Historical Provider, MD  warfarin (COUMADIN) 2.5 MG tablet Alternate 1 tablet and 1/2  tablet every day. 08/23/13   Amy E Ermalene SearingBedsole, MD   BP 137/71  Pulse 49  Temp(Src) 97.5 F (36.4 C) (Temporal)  Resp 21  SpO2 96% Physical Exam  Nursing note and vitals reviewed. Constitutional: She is oriented to person, place, and time. She appears well-developed and well-nourished. No distress.  HENT:  Head: Normocephalic and atraumatic.  Eyes: Conjunctivae are normal.  Neck: Neck supple.  Cardiovascular: Normal rate, regular rhythm, normal heart sounds and intact distal pulses.   Pulmonary/Chest: No accessory muscle usage. Tachypnea noted. She  has decreased breath sounds (bilateral bases). She has wheezes (faint, scattered).  Abdominal: Soft. She exhibits no distension. There is no tenderness.  Musculoskeletal: Normal range of motion. She exhibits edema (2+ pitting lower extremity edema bilaterally).  Neurological: She is alert and oriented to person, place, and time.  Skin: Skin is warm and dry.    ED Course  Procedures (including critical care time) Labs Review Labs Reviewed  CBC - Abnormal; Notable for the following:    WBC 16.9 (*)    Hemoglobin 10.8 (*)    HCT 35.5 (*)    RDW 21.5 (*)    All other components within normal limits  PRO B NATRIURETIC PEPTIDE - Abnormal; Notable for the following:    Pro B Natriuretic peptide (BNP) 16475.0 (*)    All other components within normal limits  COMPREHENSIVE METABOLIC PANEL - Abnormal; Notable for the following:    Sodium 133 (*)    Chloride 88 (*)    Glucose, Bld 115 (*)    BUN 50 (*)    Creatinine, Ser 2.84 (*)    Albumin 2.9 (*)    AST 372 (*)    ALT 278 (*)    GFR calc non Af Amer 15 (*)    GFR calc Af Amer 17 (*)    All other components within normal limits  URINALYSIS, ROUTINE W REFLEX MICROSCOPIC - Abnormal; Notable for the following:    APPearance CLOUDY (*)    All other components within normal limits  TSH - Abnormal; Notable for the following:    TSH 5.920 (*)    All other components within normal limits   GLUCOSE, CAPILLARY - Abnormal; Notable for the following:    Glucose-Capillary 145 (*)    All other components within normal limits  MRSA PCR SCREENING  PROTIME-INR  TROPONIN I  HEMOGLOBIN A1C  COMPREHENSIVE METABOLIC PANEL  CBC  TROPONIN I  PROTIME-INR  TROPONIN I  HEPARIN LEVEL (UNFRACTIONATED)  LIPID PANEL  I-STAT TROPOININ, ED    Imaging Review Dg Chest Port 1 View  08/31/2013   CLINICAL DATA:  Short of breath, hypotension  EXAM: PORTABLE CHEST - 1 VIEW  COMPARISON:  01/19/2013  FINDINGS: Hypoventilation with elevated right hemidiaphragm and right lower lobe atelectasis.  Cardiac enlargement with mild vascular congestion. Small right effusion.  IMPRESSION: Hypoventilation. Elevated right hemidiaphragm with mild right lower lobe atelectasis  Congestive heart failure with mild edema and small right pleural effusion.   Electronically Signed   By: Marlan Palau M.D.   On: 08/31/2013 16:41     EKG Interpretation   Date/Time:  Tuesday August 31 2013 16:04:22 EDT Ventricular Rate:  50 PR Interval:    QRS Duration: 111 QT Interval:  448 QTC Calculation: 408 R Axis:   -16 Text Interpretation:  Sinus rhythm Premature atrial complexes Borderline  left axis deviation Abnormal R-wave progression, early transition  Borderline repolarization abnormality No significant change since last  tracing Confirmed by KNAPP  MD-J, JON ([id number]) on 08/31/2013 4:07:42 PM      MDM   Final diagnoses:  Acute exacerbation of CHF (congestive heart failure)  Syncope  Acute kidney injury    CARLA RASHAD is a 80 y.o. female with PMSH of CHF, atrial fibrillation, DVT, PE, hypertension here with worsening dyspnea and a syncopal episode this morning. At PCP office patient O2 sat 79% on baseline 3 L. She was given an albuterol med with improvement. Patient currently on mask at 55% FiO2. Speaking in full sentences.  Mild tachypnea and bradycardia to the 50s with vital signs otherwise stable.  Concern for  CHF exacerbation as patient Nedra Hai was recently off of her diuretic for 3 days has had worsening weight gain. Would also be concerned for atypical presentation for ACS. Patient on Coumadin for history of PE and will check coags. Has not had infectious symptoms and have low suspicion for pneumonia but will get chest x-ray.  Labs/Imaging: CBC, CMP, BNP, troponin, PT/INR, EKG, chest x-ray.    Date: 08/31/2013  Rate: 50  Rhythm: sinus bradycardia  QRS Axis: normal  Intervals: normal  ST/T Wave abnormalities: normal  Conduction Disutrbances:none  Narrative Interpretation: Sinus bradycardia  Old EKG Reviewed: Unchanged from prior EKG except for bradycardia  Chest x-ray with bilateral pulmonary edema consistent with CHF exacerbation. BNP elevated at 164[address]. Patient also with acute kidney injury creatinine 2.8 up from last recorded of 1.6. Due to acute kidney injury will give lower dose of 40 mg furosemide for volume overload.  Hospitalist consult and will admit to step down unit for further management.  Cherre Robins, MD 08/31/13 2330

## 2013-08-31 NOTE — ED Notes (Signed)
Attempted report x 3.  

## 2013-08-31 NOTE — ED Notes (Signed)
Attempted report x1. 

## 2013-08-31 NOTE — Telephone Encounter (Signed)
Pt said for last 2 days has had fever up to 100, after taking Tylenol fever comes down; pt is not urinating as often as usual; pt gained from 275 lbs - 281 lbs in last 2 days. No burning or pain upon urination. Pt continuously on 2.5 L of O2. Pt said no more SOB than usual.pt scheduled appt today at 1:30 pm, pt said could not come earlier due to getting a ride to office. Advised pt if condition changes or worsens prior to appt to go to UC. Pt voiced understanding.

## 2013-08-31 NOTE — ED Notes (Signed)
Report called to Renea EeEvelyn, RN [phone].

## 2013-08-31 NOTE — ED Notes (Signed)
Pt from [place] with shortness of breath and hypotension.  Pt was 79% on 3L; given albuterol treatment, currently sating 98% with EMS.  Pt initially hypotensive at dr's office, BP on arrival 128/78.  Pt at dr's office getting medications changed.  Pt with chronic shortness of breath.  Pt with reported 3lb gain overnight.

## 2013-08-31 NOTE — Progress Notes (Signed)
Placed patient on her home cpap with 8lpm 02 bleed in .  HR 47 RR 22 02 Saturation 96%  Patient is tolerating well.

## 2013-08-31 NOTE — ED Notes (Signed)
Pt placed on 6L [place] to eat.  Will monitor O2 sats.  Pt notified that if O2 sats fell while she was eating that she would have to be placed back on venti mask.  Respiratory made aware.

## 2013-08-31 NOTE — ED Notes (Signed)
Per flow, pt to be moved to step down.

## 2013-08-31 NOTE — Progress Notes (Signed)
Pre visit review using our clinic review tool, if applicable. No additional management support is needed unless otherwise documented below in the visit note. 

## 2013-08-31 NOTE — Progress Notes (Signed)
   Subjective:    Patient ID: [name] [name], female    DOB: 03/14/1932, 80 y.o.   MRN: [id number]  Loss of Consciousness Associated symptoms include a fever. Pertinent negatives include no abdominal pain, chest pain or palpitations.    80 year old female presents after episode of loss of consciousness as well as weight gain, left hand swelling and decreased UOP.  She started with gout flare in left second PIP swelling.. Started colchicine 3 days ago.  now redness and swelling in left second PIP, swelling in hand.  She noted 2 days ago decreased UOP. Last Urine small amount drips this AM. No dysuria. Yesterday noted fever  100.0. Treated with tylenol. Weight gain from 275-281 at home in last 2 days.  She took an extra furosemide today.  Minimal cough, no ear pain, no sore throat.  Today at 12:45 PM, she had taken oxygen off to get to car. She felt weak, lightheaded, short of breath She laid back in car and was not responding to friend.    We had seen her last week with low potassium thought to be due to diuretics.  They were held temporarily and potassium was increased. She restarted diuretics on   Today her BP and o2 are low despite oxygen.   Wt Readings from Last 3 Encounters:  08/24/13 277 lb 8 oz (125.873 kg)  08/20/13 273 lb (123.832 kg)  06/24/13 281 lb (127.461 kg)   BP Readings from Last 3 Encounters:  08/31/13 94/48  08/24/13 128/68  08/20/13 122/78      Review of Systems  Constitutional: Positive for fever and fatigue.  HENT: Negative for ear pain.   Eyes: Negative for pain.  Respiratory: Positive for shortness of breath. Negative for wheezing.   Cardiovascular: Positive for syncope. Negative for chest pain and palpitations.  Gastrointestinal: Negative for abdominal pain.       Objective:   Physical Exam  Constitutional:  Pale appearing female in NAD  morbidly obese  HENT:  Mouth/Throat: Oropharynx is clear and moist.  Eyes: Conjunctivae are normal.  Pupils are equal, round, and reactive to light. Right eye exhibits no discharge. Left eye exhibits no discharge.  Neck: Normal range of motion. Neck supple. No thyromegaly present.  Cardiovascular: Regular rhythm.  Bradycardia present.  Exam reveals distant heart sounds.   Mild for her swelling B legs  Pulmonary/Chest: She has wheezes in the left middle field. She has rales in the right lower field and the left lower field.  Wheeze in left lung, rales B bases  Musculoskeletal:  Erythema at left 2nd PIP jpoint pain and swellingdiffusely in left hand.          Assessment & Plan:  Very complicated pt with new onset fever, bradycardia, hypoxia, hypotension  Resulting in syncopal spell as well as decreased UOP and new gout flare in left finger and hand.   Also with rales in B lung and rhonchi at bases.    Concerning for sepsis, lung infection as well as fluid overload.  BP may be low due to extra lasix she took today but does not explain other issues.   [facility]

## 2013-08-31 NOTE — H&P (Addendum)
[facility] History and Physical  [name] [name] ZOX:[id number] DOB: 11-28-1931 DOA: 08/31/2013  Referring physician: Emergency Department PCP: Kerby Nora, MD  Specialists:   Chief Complaint: SOB, syncope  HPI: [name] [name] is a 80 y.o. female  With a hx of chronic diastolic heart failure, afib on coumadin, htn, gout, obesity, and stage 3 ckd who initially presented to her PCP one week prior, found to have low K. Pt was instructed to hold diuretics temporarily. Pt then noted increased sob and decreased urine output with 6 pound wt gain over the past 2 days. She presented to clinic on day of admit, found to be mildly hypotensive and was referred to the Ed. In the Ed, pt was noted to have cxr findings worrisome for pulm edema. The patient was given 40mg  IV lasix and the hospitalist consulted for admission. Of note, the patient is normally home O2 dependent. In ED, required venturi mask at 56% FiO2.  Review of Systems:  Per above, the remainder of the 10pt ros reviewed and are neg.  Past Medical History  Diagnosis Date  . Congestive heart failure, unspecified     a. ECHO 6/0: EF 55-60%, mild LVH, mildly dilated RV w. mildly dec fx, RVSP 67  . Chronic atrial fibrillation     failed cardioversion in past  -repeat DC-CV on October 5th, 2010  . Personal history of DVT (deep vein thrombosis) 2008  . Obstructive sleep apnea   . HTN (hypertension)   . Morbid obesity   . Shingles     with postherpetic neuraigia  . GERD (gastroesophageal reflux disease)   . Hyperlipidemia   . Meningitis 1950s  . Gallstones     s/p cholecystectomy  . CAD (coronary artery disease)     nonobstructive by cath 8/10 - LAD 40-50% w mild PAH mean 29 w PVR 3.2 [place]  . AAA (abdominal aortic aneurysm)     small  . PE (pulmonary embolism)     history of  . Anemia    Past Surgical History  Procedure Laterality Date  . Appendectomy  1947  . Tonsillectomy  1946  . Hospitalized with meningitis at [facility]   1960  . Cholecystectomy    . Cardioversion  01/2007    x2   Social History:  reports that she has never smoked. She has never used smokeless tobacco. She reports that she drinks alcohol. She reports that she does not use illicit drugs.  where does patient live--home, ALF, SNF? and with whom if at home?  Can patient participate in ADLs?  Allergies  Allergen Reactions  . Prednisone     Eyes swelling    Family History  Problem Relation Age of Onset  . Heart failure Mother   . Depression Mother   . Ovarian cancer Paternal Grandmother   . Heart disease Paternal Grandfather   . Obesity Other     (be sure to complete)  Prior to Admission medications   Medication Sig Start Date End Date Taking? Authorizing Provider  allopurinol (ZYLOPRIM) 100 MG tablet Take 1 tablet (100 mg total) by mouth daily. 06/24/13  Yes Amy Michelle Nasuti, MD  Ascorbic Acid (VITAMIN C PO) Take 1 tablet by mouth daily.   Yes Historical Provider, MD  aspirin (ASPIR-81) 81 MG EC tablet Take 81 mg by mouth daily.     Yes Historical Provider, MD  atorvastatin (LIPITOR) 10 MG tablet Take 1 tablet (10 mg total) by mouth daily. Please schedule a physical, having fasting  labs prior, for any additional refills prior. 05/28/13  Yes Amy Michelle NasutiE Bedsole, MD  cetirizine (ZYRTEC ALLERGY) 10 MG tablet Take 10 mg by mouth daily.     Yes Historical Provider, MD  colchicine 0.6 MG tablet Take 0.6-1.2 mg by mouth daily as needed (for gout flare up).   Yes Historical Provider, MD  diltiazem (CARDIZEM CD) 120 MG 24 hr capsule Take 1 capsule (120 mg total) by mouth daily. 02/18/13  Yes Antonieta Ibaimothy J Gollan, MD  escitalopram (LEXAPRO) 10 MG tablet Take 1 tablet (10 mg total) by mouth daily. 08/20/13  Yes Amy Michelle NasutiE Bedsole, MD  ferrous sulfate 325 (65 FE) MG tablet Take 1 tablet (325 mg total) by mouth daily with breakfast. 06/24/13  Yes Amy Michelle NasutiE Bedsole, MD  flecainide (TAMBOCOR) 100 MG tablet Take 1 tablet (100 mg total) by mouth every 12 (twelve) hours. 08/19/13   Yes Antonieta Ibaimothy J Gollan, MD  furosemide (LASIX) 40 MG tablet Take 40 mg by mouth 2 (two) times daily as needed for edema (for weight gain).   Yes Historical Provider, MD  ipratropium (ATROVENT HFA) 17 MCG/ACT inhaler Inhale 2 puffs into the lungs 4 (four) times daily. 07/07/13  Yes Amy Michelle NasutiE Bedsole, MD  metolazone (ZAROXOLYN) 2.5 MG tablet Take 1 tablet (2.5 mg total) by mouth daily. 07/02/13 07/02/14 Yes Antonieta Ibaimothy J Gollan, MD  oxyCODONE (OXYCONTIN) 10 MG 12 hr tablet Take 1 tablet (10 mg total) by mouth every 12 (twelve) hours as needed for pain. 01/15/13  Yes Amy Michelle NasutiE Bedsole, MD  potassium chloride SA (K-DUR,KLOR-CON) 20 MEQ tablet Take 40 mEq by mouth 2 (two) times daily. 08/20/13  Yes Amy Michelle NasutiE Bedsole, MD  warfarin (COUMADIN) 2.5 MG tablet Take 1.25-2.5 mg by mouth See admin instructions. Alternate every other day with 0.5 -1 tablet   Yes Historical Provider, MD  NON FORMULARY Oxygen 2.5 liters daily    Historical Provider, MD   Physical Exam: Filed Vitals:   08/31/13 1605 08/31/13 1615 08/31/13 1620 08/31/13 1645  BP: 145/67 125/71  137/71  Pulse: 50 49 48 49  Temp: 97.5 F (36.4 C)     TempSrc: Temporal     Resp: 21 16  21   SpO2: 95% 96% 96% 96%     General:  Awake, in nad, venturi-mask  Eyes: PERRL B  ENT: Membranes moist, dentition fair  Neck: trachea midline, neck supple  Cardiovascular: regular, s1, s2  Respiratory: Increased resp effort, decreased bs, mild end-expiratory wheezing  Abdomen: soft, nodistended, obese  Skin: normal skin turgor, no abnormal skin lesions seen  Musculoskeletal: perfused, no clubbing, pain/swelling/erythema over L second PIP  Psychiatric: mood/affect normal// no auditory/visual hallucinations  Neurologic: cn2-12 grossly intact, strength/sensation intact  Labs on Admission:  Basic Metabolic Panel:  Recent Labs Lab 08/31/13 1631  NA 133*  K 4.9  CL 88*  CO2 23  GLUCOSE 115*  BUN 50*  CREATININE 2.84*  CALCIUM 9.3   Liver Function  Tests:  Recent Labs Lab 08/31/13 1631  AST 372*  ALT 278*  ALKPHOS 107  BILITOT 0.3  PROT 7.8  ALBUMIN 2.9*   No results found for this basename: LIPASE, AMYLASE,  in the last 168 hours No results found for this basename: AMMONIA,  in the last 168 hours CBC:  Recent Labs Lab 08/31/13 1631  WBC 16.9*  HGB 10.8*  HCT 35.5*  MCV 91.3  PLT 338   Cardiac Enzymes: No results found for this basename: CKTOTAL, CKMB, CKMBINDEX, TROPONINI,  in the last 168  hours  BNP (last 3 results)  Recent Labs  08/31/13 1631  PROBNP 16475.0*   CBG: No results found for this basename: GLUCAP,  in the last 168 hours  Radiological Exams on Admission: Dg Chest Port 1 View  08/31/2013   CLINICAL DATA:  Short of breath, hypotension  EXAM: PORTABLE CHEST - 1 VIEW  COMPARISON:  01/19/2013  FINDINGS: Hypoventilation with elevated right hemidiaphragm and right lower lobe atelectasis.  Cardiac enlargement with mild vascular congestion. Small right effusion.  IMPRESSION: Hypoventilation. Elevated right hemidiaphragm with mild right lower lobe atelectasis  Congestive heart failure with mild edema and small right pleural effusion.   Electronically Signed   By: Marlan Palauharles  Clark M.D.   On: 08/31/2013 16:41    EKG: Independently reviewed. NSR  Assessment/Plan Active Problems:   OBESITY-MORBID (>100')   HYPERTENSION   Atrial fibrillation   DIASTOLIC HEART FAILURE, CHRONIC   Gout   CKD (chronic kidney disease) stage 3, GFR 30-59 ml/min   Syncope and collapse   Acute exacerbation of CHF (congestive heart failure)   Acutely decompensated CHF - BNP of over 16,000 - Will continue IV lasix as tolerated - Follow strict i/o and daily wts - Admit to stepdown - Will check a1c, tsh, and AM lipids - Correct lytes as needed - If no improvement, may consider Cardiology consult  Acute renal failure - In setting of volume overload, will cont lasix per above - Follow renal fx closely - Baseline Cr of  1.6  Afib - Rate controlled - Cont cardizem as tolerated - On coumadin - subtherapeutic today so will start heparin bridge  HTN - Stable currently - Cont home meds  Gout flare - Consider trial of steroids  Obesity - Stable  DVT prophylaxis - on full dose anticoagulation  COPD - mild end-expiratory wheezing on exam - Cont on q4hr duonebs with q2hrs prn  Gout - L second PIP erythematous and tender - Will give a trial of solumedrol x 1 dose  Code Status: Full (must indicate code status--if unknown or must be presumed, indicate so) Family Communication: Pt and grandson in room (indicate person spoken with, if applicable, with phone number if by telephone) Disposition Plan: Pending (indicate anticipated LOS)  Time spent: 35min  Jerald KiefStephen K Danyel Griess [facility] Pager [phone]  If 7PM-7AM, please contact night-coverage [email or web address] Password [email or web address] 08/31/2013, 5:33 PM

## 2013-08-31 NOTE — ED Notes (Signed)
Pt reports running fever at home of 100.

## 2013-09-01 LAB — COMPREHENSIVE METABOLIC PANEL
ALBUMIN: 2.8 g/dL — AB (ref 3.5–5.2)
ALK PHOS: 111 U/L (ref 39–117)
ALT: 246 U/L — ABNORMAL HIGH (ref 0–35)
AST: 218 U/L — ABNORMAL HIGH (ref 0–37)
BUN: 56 mg/dL — AB (ref 6–23)
CO2: 26 mEq/L (ref 19–32)
CREATININE: 2.43 mg/dL — AB (ref 0.50–1.10)
Calcium: 9.2 mg/dL (ref 8.4–10.5)
Chloride: 91 mEq/L — ABNORMAL LOW (ref 96–112)
GFR calc non Af Amer: 18 mL/min — ABNORMAL LOW (ref >90)
GFR, EST AFRICAN AMERICAN: 21 mL/min — AB (ref >90)
GLUCOSE: 147 mg/dL — AB (ref 70–99)
POTASSIUM: 4.1 meq/L (ref 3.7–5.3)
Sodium: 137 mEq/L (ref 137–147)
Total Bilirubin: 0.2 mg/dL — ABNORMAL LOW (ref 0.3–1.2)
Total Protein: 7.6 g/dL (ref 6.0–8.3)

## 2013-09-01 LAB — HEPARIN LEVEL (UNFRACTIONATED)
Heparin Unfractionated: <0.1 IU/mL — ABNORMAL LOW (ref 0.30–0.70)
Heparin Unfractionated: 0.13 IU/mL — ABNORMAL LOW (ref 0.30–0.70)

## 2013-09-01 LAB — TROPONIN I: Troponin I: <0.3 ng/mL (ref <0.30)

## 2013-09-01 LAB — CBC
HCT: 33.4 % — ABNORMAL LOW (ref 36.0–46.0)
Hemoglobin: 10.4 g/dL — ABNORMAL LOW (ref 12.0–15.0)
MCH: 28 pg (ref 26.0–34.0)
MCHC: 31.1 g/dL (ref 30.0–36.0)
MCV: 89.8 fL (ref 78.0–100.0)
PLATELETS: 322 10*3/uL (ref 150–400)
RBC: 3.72 MIL/uL — AB (ref 3.87–5.11)
RDW: 21.1 % — AB (ref 11.5–15.5)
WBC: 14.7 10*3/uL — AB (ref 4.0–10.5)

## 2013-09-01 LAB — LIPID PANEL
CHOL/HDL RATIO: 3 ratio
CHOLESTEROL: 133 mg/dL (ref 0–200)
HDL: 45 mg/dL (ref >39)
LDL CALC: 68 mg/dL (ref 0–99)
TRIGLYCERIDES: 100 mg/dL (ref <150)
VLDL: 20 mg/dL (ref 0–40)

## 2013-09-01 LAB — PROTIME-INR
INR: 1.19 (ref 0.00–1.49)
Prothrombin Time: 14.8 seconds (ref 11.6–15.2)

## 2013-09-01 MED ORDER — FUROSEMIDE 10 MG/ML IJ SOLN
40.0000 mg | Freq: Two times a day (BID) | INTRAMUSCULAR | Status: DC
  Administered 2013-09-01 – 2013-09-02 (×3): 40 mg via INTRAVENOUS
  Filled 2013-09-01 (×6): qty 4

## 2013-09-01 MED ORDER — HEPARIN BOLUS VIA INFUSION
2000.0000 [IU] | Freq: Once | INTRAVENOUS | Status: AC
  Administered 2013-09-01: 2000 [IU] via INTRAVENOUS
  Filled 2013-09-01: qty 2000

## 2013-09-01 MED ORDER — WARFARIN SODIUM 5 MG PO TABS
5.0000 mg | ORAL_TABLET | Freq: Once | ORAL | Status: AC
  Administered 2013-09-01: 5 mg via ORAL
  Filled 2013-09-01: qty 1

## 2013-09-01 NOTE — Progress Notes (Signed)
ANTICOAGULATION CONSULT NOTE  Pharmacy Consult for Warfarin/Heparin  Indication: atrial fibrillation  Allergies  Allergen Reactions  . Prednisone     Eyes swelling    Patient Measurements: Height: 5\' 4"  (162.6 cm) Weight: 281 lb 1.4 oz (127.5 kg) IBW/kg (Calculated) : 54.7 Heparin Dosing Weight: 86 kg   Vital Signs: Temp: 98.2 F (36.8 C) (04/22 0300) Temp src: Axillary (04/22 0300) BP: 108/65 mmHg (04/22 0300) Pulse Rate: 57 (04/22 0300)  Labs:  Recent Labs  08/30/13 1501 08/31/13 1631 08/31/13 2205 09/01/13 0325  HGB  --  10.8*  --  10.4*  HCT  --  35.5*  --  33.4*  PLT  --  338  --  322  LABPROT  --  15.1  --  14.8  INR 1.2 1.22  --  1.19  HEPARINUNFRC  --   --   --  <0.10*  CREATININE  --  2.84*  --  2.43*  TROPONINI  --   --  <0.30 <0.30    Estimated Creatinine Clearance: 24.8 ml/min (by C-G formula based on Cr of 2.43).  Assessment: 80 yo female with h/o Afib, subtherapeutic INR, for anticoagulation  Goal of Therapy:  Heparin level 0.3-0.7 INR 2-3 Monitor platelets by anticoagulation protocol: Yes   Plan:  Heparin 2000 units IV bolus, then increase heparin 1450 units/hr Check heparin level in 6 hours. Coumadin 5 mg today  Geannie RisenGreg Elonna Mcfarlane, PharmD, BCPS

## 2013-09-01 NOTE — Progress Notes (Signed)
[facility]  Aneta Minseggy T Bonfiglio AVW:[id number] DOB: 07/06/1931 DOA: 08/31/2013 PCP: [name], MD  Time spent : 35 mins  Brief narrative: 80 y.o. female with a hx of chronic diastolic heart failure, afib on coumadin, htn, gout, obesity, and stage 3 ckd who initially presented to her PCP one week prior. Found to have low K. Pt was reportedly instructed to hold diuretics temporarily. Pt then noted increased sob and decreased urine output with 6 pound wt gain over the past 2 days. She presented to the PCP clinic on day of admit, found to be mildly hypotensive and was referred to the ED. In the ED, pt was noted to have cxr findings worrisome for pulm edema. The patient was given 40mg  IV lasix and the Hospitalist consulted for admission. Of note, the patient is normally home O2 dependent but was requiring a VM in the ED.  HPI/Subjective: Today she endorses feels much better, not "rattling"  Assessment/Plan:  Acute respiratory failure with hypoxia/volume overload - cont IV Lasix - ECHO Feb 2015: normal -suspect pulmonary sx's from renal failure related volume overload - pt with low GFR and requires Lasix to maintain adequate UOP -has weaned from CPAP to cannula  Acute renal failure on CKD 4  - will need Lasix indefinitely- convert to PO dosing once oxygen weaned to home dose-likely needs high dose Lasix-was also on Metolazone pre admit which was cont'd at admission - Follow renal fx closely  -needs to establish with nephrologist for OP management of chronic renal failure -Baseline Cr of 1.6  -watch overcorrection of BP  Hypokalemia -resolved-follow on diuretics  Transaminitis -TB normal so likely due to passive congestion especially since trend is down since admit - follow   Anemia CKD -hgb 10 - consider eval for Epogen -cont Iron PO  Afib  - Rate controlled  - Cont cardizem as tolerated - BP soft - On coumadin - sub therapeutic at presentation  and today so cont heparin bridge   HTN  - Stable currently  - Cont home meds   Gout flare  - no reported symptoms today  Obesity  - Stable  DVT prophylaxis: Heparin --> coumadin Code Status: Full Family Communication: No family at bedside Disposition Plan/Expected LOS: Transfer to telemetry  Consultants: None  Procedures: None  Antibiotics: None  Objective: Blood pressure 117/58, pulse 63, temperature 97.4 F (36.3 C), temperature source Axillary, resp. rate 13, height 5\' 4"  (1.626 m), weight 281 lb 1.4 oz (127.5 kg), SpO2 100.00%.  Intake/Output Summary (Last 24 hours) at 09/01/13 1334 Last data filed at 09/01/13 1218  Gross per 24 hour  Intake 705.67 ml  Output   2925 ml  Net -2219.33 ml   Exam: General: No acute respiratory distress Lungs: Clear to auscultation bilaterally without wheezes and only a few fine bibasilar crackles, 3 L Cardiovascular: Regular rate and rhythm without murmur gallop or rub normal S1 and S2, 1+ peripheral edema without JVD Abdomen: Nontender, nondistended, soft, bowel sounds positive, no rebound, no ascites, no appreciable mass Musculoskeletal: No significant cyanosis, clubbing of bilateral lower extremities Neurological: Alert and oriented x 3, moves all extremities x 4 without focal neurological deficits, CN 2-12 intact  Scheduled Meds:  Scheduled Meds: . aspirin  81 mg Oral Daily  . atorvastatin  10 mg Oral q1800  . diltiazem  120 mg Oral Daily  . escitalopram  10 mg Oral Daily  . ferrous sulfate  325 mg Oral Q  breakfast  . flecainide  100 mg Oral Q12H  . furosemide  60 mg Intravenous Daily  . ipratropium-albuterol  3 mL Nebulization Q4H  . metolazone  2.5 mg Oral Daily  . warfarin  5 mg Oral ONCE-1800  . Warfarin - Pharmacist Dosing Inpatient   Does not apply q1800   Continuous Infusions: . heparin 1,450 Units/hr (09/01/13 1320)    Data Reviewed: Basic Metabolic Panel:  Recent Labs Lab 08/31/13 1631 09/01/13 0325    NA 133* 137  K 4.9 4.1  CL 88* 91*  CO2 23 26  GLUCOSE 115* 147*  BUN 50* 56*  CREATININE 2.84* 2.43*  CALCIUM 9.3 9.2   Liver Function Tests:  Recent Labs Lab 08/31/13 1631 09/01/13 0325  AST 372* 218*  ALT 278* 246*  ALKPHOS 107 111  BILITOT 0.3 0.2*  PROT 7.8 7.6  ALBUMIN 2.9* 2.8*   CBC:  Recent Labs Lab 08/31/13 1631 09/01/13 0325  WBC 16.9* 14.7*  HGB 10.8* 10.4*  HCT 35.5* 33.4*  MCV 91.3 89.8  PLT 338 322   Cardiac Enzymes:  Recent Labs Lab 08/31/13 2205 09/01/13 0325 09/01/13 0947  TROPONINI <0.30 <0.30 <0.30   BNP (last 3 results)  Recent Labs  08/31/13 1631  PROBNP 16475.0*   CBG:  Recent Labs Lab 08/31/13 2101  GLUCAP 145*    Recent Results (from the past 240 hour(s))  MRSA PCR SCREENING     Status: None   Collection Time    08/31/13  8:32 PM      Result Value Ref Range Status   MRSA by PCR NEGATIVE  NEGATIVE Final   Comment:            The GeneXpert MRSA Assay (FDA     approved for NASAL specimens     only), is one component of a     comprehensive MRSA colonization     surveillance program. It is not     intended to diagnose MRSA     infection nor to guide or     monitor treatment for     MRSA infections.     Studies:  Recent x-ray studies have been reviewed in detail by the Attending Physician      Junious SilkAllison Ellis, ANP [facility] Office  [phone] Pager [phone]  **If unable to reach the above provider after paging please contact the Flow Manager @ [phone]  On-Call/Text Page:      [email or web address]      password [email or web address]  If 7PM-7AM, please contact night-coverage [email or web address] Password [email or web address] 09/01/2013, 1:34 PM   LOS: 1 day   I have personally examined this patient and reviewed the entire database. I have reviewed the above note, made any necessary editorial changes, and agree with its content.  Lonia BloodJeffrey T. Friedrich Harriott, MD [facility]

## 2013-09-01 NOTE — Assessment & Plan Note (Signed)
Plan to stop venlafaxine and change to lexapro

## 2013-09-01 NOTE — Progress Notes (Signed)
Respiratory therpay note- Patient unable to use cpap last night , unable to maintain sp02. Placed on VM at 55% for most of the night and now on 5l/min [place] sp02 93-95%. Still having touble with complete sentences. Continue to monitor.

## 2013-09-01 NOTE — Progress Notes (Signed)
ANTICOAGULATION CONSULT NOTE - Follow Up Consult  Pharmacy Consult for Heparin Indication: Hx Afib  Allergies  Allergen Reactions  . Prednisone     Eyes swelling    Patient Measurements: Height: 5\' 4"  (162.6 cm) Weight: 276 lb 12.8 oz (125.556 kg) (pt unable to stand) IBW/kg (Calculated) : 54.7 Heparin Dosing Weight: 85.5 kg  Vital Signs: Temp: 97.8 F (36.6 C) (04/22 1633) Temp src: Oral (04/22 1633) BP: 128/59 mmHg (04/22 1633) Pulse Rate: 65 (04/22 1633)  Labs:  Recent Labs  08/30/13 1501 08/31/13 1631 08/31/13 2205 09/01/13 0325 09/01/13 0947 09/01/13 1545  HGB  --  10.8*  --  10.4*  --   --   HCT  --  35.5*  --  33.4*  --   --   PLT  --  338  --  322  --   --   LABPROT  --  15.1  --  14.8  --   --   INR 1.2 1.22  --  1.19  --   --   HEPARINUNFRC  --   --   --  <0.10*  --  0.13*  CREATININE  --  2.84*  --  2.43*  --   --   TROPONINI  --   --  <0.30 <0.30 <0.30  --     Estimated Creatinine Clearance: 24.6 ml/min (by C-G formula based on Cr of 2.43).   Medications:  Heparin @ 1450 units/hr (14.5 ml/hr)  Assessment: 80 y.o. F who continues on a heparin bridge to therapeutic INR with warfarin for hx Afib. Heparin level this afternoon remains SUBtherapeutic (HL 0.13, goal of 0.3-0.7). CBC stable this morning. No overt s/sx of bleeding noted.   Goal of Therapy:  Heparin level 0.3-0.7 units/ml Monitor platelets by anticoagulation protocol: Yes   Plan:  1. Heparin bolus of 2000 units x 1 2. Increase heparin drip to 1700 units/hr (17 ml/hr) 3. Will continue to monitor for any signs/symptoms of bleeding and will follow up with heparin level in 8 hours   Georgina PillionElizabeth Omni Dunsworth, PharmD, BCPS Clinical Pharmacist Pager: [phone] 09/01/2013 4:43 PM

## 2013-09-01 NOTE — Assessment & Plan Note (Addendum)
Off  one of her two diuretics overnight, on increase potassium . Due for recheck. Will need to limit time off metolazone given tenuous fluid status.

## 2013-09-01 NOTE — Progress Notes (Signed)
[facility]  Patient Status: Active (receiving services up to time of hospitalization)  [name] is providing the following services: RN and PT  If patient discharges after hours, please call [phone].   Kizzie FurnishDonna Fellmy 09/01/2013, 12:05 PM

## 2013-09-01 NOTE — Progress Notes (Signed)
Utilization Review Completed.[name] Torrez T Dowell4/22/2015  

## 2013-09-01 NOTE — Progress Notes (Signed)
Removed the patient from her Cpap and placed her back on venti mask at 55%  02 saturation 97%.  RN aware and patient tolerating well..[name]

## 2013-09-01 NOTE — Progress Notes (Signed)
RT Note- patients oxygenation improved, o2 now at 3l/min [place], continue to monitor.

## 2013-09-01 NOTE — Plan of Care (Signed)
Problem: Phase I Progression Outcomes Goal: EF % per last Echo/documented,Core Reminder form on chart Outcome: Completed/Met Date Met:  09/01/13 EF 55% per ECHO done in 2014.

## 2013-09-01 NOTE — Progress Notes (Signed)
Report given to receiving RN. Patient in bed resting watching TV. No signs or symptoms of distress and no verbal complaints. 

## 2013-09-02 LAB — COMPREHENSIVE METABOLIC PANEL
ALT: 188 U/L — AB (ref 0–35)
AST: 104 U/L — ABNORMAL HIGH (ref 0–37)
Albumin: 2.9 g/dL — ABNORMAL LOW (ref 3.5–5.2)
Alkaline Phosphatase: 102 U/L (ref 39–117)
BUN: 66 mg/dL — AB (ref 6–23)
CALCIUM: 9.4 mg/dL (ref 8.4–10.5)
CO2: 31 mEq/L (ref 19–32)
CREATININE: 1.64 mg/dL — AB (ref 0.50–1.10)
Chloride: 88 mEq/L — ABNORMAL LOW (ref 96–112)
GFR calc non Af Amer: 29 mL/min — ABNORMAL LOW (ref >90)
GFR, EST AFRICAN AMERICAN: 33 mL/min — AB (ref >90)
Glucose, Bld: 118 mg/dL — ABNORMAL HIGH (ref 70–99)
Potassium: 3.1 mEq/L — ABNORMAL LOW (ref 3.7–5.3)
Sodium: 138 mEq/L (ref 137–147)
TOTAL PROTEIN: 7.4 g/dL (ref 6.0–8.3)
Total Bilirubin: 0.2 mg/dL — ABNORMAL LOW (ref 0.3–1.2)

## 2013-09-02 LAB — CBC
HEMATOCRIT: 33.8 % — AB (ref 36.0–46.0)
Hemoglobin: 10.4 g/dL — ABNORMAL LOW (ref 12.0–15.0)
MCH: 27.8 pg (ref 26.0–34.0)
MCHC: 30.8 g/dL (ref 30.0–36.0)
MCV: 90.4 fL (ref 78.0–100.0)
PLATELETS: 331 10*3/uL (ref 150–400)
RBC: 3.74 MIL/uL — ABNORMAL LOW (ref 3.87–5.11)
RDW: 21.5 % — AB (ref 11.5–15.5)
WBC: 14.7 10*3/uL — AB (ref 4.0–10.5)

## 2013-09-02 LAB — HEPARIN LEVEL (UNFRACTIONATED)
HEPARIN UNFRACTIONATED: 0.35 [IU]/mL (ref 0.30–0.70)
Heparin Unfractionated: 0.18 IU/mL — ABNORMAL LOW (ref 0.30–0.70)

## 2013-09-02 LAB — MAGNESIUM: Magnesium: 2.2 mg/dL (ref 1.5–2.5)

## 2013-09-02 LAB — PROTIME-INR
INR: 1.27 (ref 0.00–1.49)
Prothrombin Time: 15.6 seconds — ABNORMAL HIGH (ref 11.6–15.2)

## 2013-09-02 MED ORDER — POTASSIUM CHLORIDE CRYS ER 20 MEQ PO TBCR
40.0000 meq | EXTENDED_RELEASE_TABLET | Freq: Every day | ORAL | Status: DC
  Administered 2013-09-02 – 2013-09-03 (×2): 40 meq via ORAL
  Filled 2013-09-02 (×4): qty 2

## 2013-09-02 MED ORDER — WARFARIN SODIUM 5 MG PO TABS
5.0000 mg | ORAL_TABLET | Freq: Once | ORAL | Status: AC
  Administered 2013-09-02: 5 mg via ORAL
  Filled 2013-09-02: qty 1

## 2013-09-02 MED ORDER — IPRATROPIUM BROMIDE 0.06 % NA SOLN
2.0000 | Freq: Two times a day (BID) | NASAL | Status: DC
  Administered 2013-09-02 – 2013-09-06 (×9): 2 via NASAL
  Filled 2013-09-02: qty 30

## 2013-09-02 MED ORDER — HEPARIN BOLUS VIA INFUSION
2000.0000 [IU] | Freq: Once | INTRAVENOUS | Status: AC
  Administered 2013-09-02: 2000 [IU] via INTRAVENOUS
  Filled 2013-09-02: qty 2000

## 2013-09-02 MED ORDER — LORATADINE 10 MG PO TABS
10.0000 mg | ORAL_TABLET | Freq: Every day | ORAL | Status: DC
  Administered 2013-09-02 – 2013-09-06 (×5): 10 mg via ORAL
  Filled 2013-09-02 (×5): qty 1

## 2013-09-02 NOTE — Progress Notes (Signed)
Order received to discontinue telemetry, monitor discontinued and cleaned. CCMD notified

## 2013-09-02 NOTE — Consult Note (Signed)
WOC wound consult note Reason for Consult: LE wounds, pt reported skin tear/trauma last July.  Has been under the care of MD for wound with HHRN.  Currently using hydroferra blue dressing (bacteriostatic dressing). Currently POC change M/Th.  Wound type: recalcitrant skin tear Pressure Ulcer POA:No Measurement: 3 smaller lesions distally aprox 2.5 cm x 2.0cm x 0.2 with larger area more proximal 5cm x 8cm x 0.2cm  Wound bed: all areas are partial thickness, pink, moist, pale Drainage (amount, consistency, odor) minimal, serous Periwound: intact, some superficial scabbing on the distal extremity. Dressing procedure/placement/frequency: Continue current POC with hydroferra blue, change M/Th.    Discussed POC with patient and bedside nurse.  Re consult if needed, will not follow at this time. Thanks  Karolynn Infantino [employer] RN, CWOCN [phone])

## 2013-09-02 NOTE — Discharge Summary (Addendum)
Physician Discharge Summary  ELISSE PENNICK JXB:[id number] DOB: 1932/01/24 DOA: 08/31/2013  PCP: Kerby Nora, MD  Admit date: 08/31/2013 Discharge date: 09/06/2013  Recommendations for Outpatient Follow-up:  1. Follow up with primary care doctor or cardiologist later this week.  Weight check and repeat electrolytes at that time.  Trend blood pressure.  Repeat INR in 1 week.   2. Continue metolazone and increased lasix and potassium 3. Home health PT and RN    Discharge Diagnoses:  Active Problems:   OBESITY-MORBID (>100')   HYPERTENSION   Atrial fibrillation   DIASTOLIC HEART FAILURE, CHRONIC   Gout   CKD (chronic kidney disease) stage 3, GFR 30-59 ml/min   Syncope and collapse   Acute-on-chronic respiratory failure   Acute on chronic diastolic heart failure   CHF exacerbation   Chest pain   Discharge Condition: stable, improved  Diet recommendation: healthy heart, low sodium  Wt Readings from Last 3 Encounters:  09/06/13 121.156 kg (267 lb 1.6 oz)  08/24/13 125.873 kg (277 lb 8 oz)  08/20/13 123.832 kg (273 lb)    History of present illness:  [name] [name] is a 80 y.o. female  With a hx of chronic diastolic heart failure, afib on coumadin, htn, gout, obesity, and stage 3 ckd who initially presented to her PCP one week prior, found to have low K. Pt was instructed to hold diuretics temporarily. Pt then noted increased sob and decreased urine output with 6 pound wt gain over the past 2 days. She presented to clinic on day of admit, found to be mildly hypotensive and was referred to the Ed. In the Ed, pt was noted to have cxr findings worrisome for pulm edema. The patient was given 40mg  IV lasix and the hospitalist consulted for admission. Of note, the patient is normally home O2 dependent. In ED, required venturi mask at 56% FiO2.  Hospital Course:   Acute respiratory failure with hypoxia secondary to acute on chronic diastolic heart failure. She was started on IV Lasix.  Initially she required CPAP however she was transitioned to nasal cannula. Since admission, she has lost 15 pounds. She is negative 12 liters overall. Telemetry demonstrated sinus bradycardia, troponin was negative.  Echocardiogram from February of this year demonstrated preserved ejection fraction. Some of her hyperkalemia and pulmonary edema may also be secondary to progressive acute renal failure. She is likely diuretic dependent. Would not recommend stopping diuretics in the future. Her renal impairment was probably cardiorenal syndrome as her kidney function improved with diuresis. She is advised to weigh herself daily and call her primary care doctor if she gains more than 3 pounds in one day or 5 pounds over 7 days. She should eat a low-sodium diet and take her Lasix every day.  Her Lasix dose has been adjusted to 80 mg twice a day in addition to her metolazone.  Acute renal failure superimposed on chronic kidney disease stage IV. Her creatinine peaked at approximately 2.8, and trended down briskly with diuresis.  Recommended to get established with a nephrologist for outpatient management of her chronic renal failure. Repeat BMP in approximately one week.  Hypokalemia, likely secondary to diuresis. She was started on daily potassium repletion. Her magnesium level was within normal limits.  Her potassium dose has been increased to 40 mEq 3 times a day.  Repeat BMP in one week.  Atypical chest pain, had an episode of substernal chest pain on 4/26 early in the morning. Telemetry demonstrated normal sinus rhythm. Troponins were  again negative. Most likely this is musculoskeletal pain related to her cough.  Transaminitis was mild and likely due to passive hepatic congestion from fluid overload. These also trended down with diuresis. Repeat liver function tests as outpatient. If they remain elevated, consider further evaluation for hepatitis.  Anemia of chronic kidney disease, hemoglobin greater than 10.  Iron deficiency anemia, continue daily iron.  Atrial fibrillation, rate controlled. She continued Cardizem. She is on Coumadin however her INR was subtherapeutic so she was placed on a heparin bridge while in the hospital.  CHADS2vasc of 4.  Her INR the day of discharge is 1.97.    Gout flare, right hand, improved.  Leukocytosis, improved with diuresis. Chest x-ray was negative for pneumonia. Urinalysis unremarkable.  Chronic wound of the right shin which is a complication of a hematoma from last year. Wound care was consulted and did dressing change on 4/23. Continue biweekly dressing changes.  Consultants:  None Procedures:  Chest x-ray Antibiotics:  None   Discharge Exam: Filed Vitals:   09/06/13 1413  BP: 125/65  Pulse: 83  Temp: 98.6 F (37 C)  Resp: 18   Filed Vitals:   09/06/13 0100 09/06/13 0445 09/06/13 0901 09/06/13 1413  BP:  125/67 125/59 125/65  Pulse: 72 75 84 83  Temp:  97.7 F (36.5 C) 97.8 F (36.6 C) 98.6 F (37 C)  TempSrc:  Oral Oral Oral  Resp: 18 20 20 18   Height:      Weight:  121.156 kg (267 lb 1.6 oz)    SpO2:  98% 94% 93%    General: Obese CF, No acute distress  HEENT: NCAT, MMM  Cardiovascular: Distant heart sounds, RR, nl S1, S2 no mrg, 2+ pulses, warm extremities  Respiratory:  CTAB, no increased WOB  Abdomen: NABS, soft, NT/ND  MSK: Normal tone and bulk, no LEE, right lower extremity with dressing >> chronic healing ulcer over last year  Neuro: Grossly intact   Discharge Instructions      Discharge Orders   Future Appointments Provider Department Dept Phone   09/14/2013 10:30 AM Amy Michelle NasutiE Bedsole, MD [facility] [phone]   Future Orders Complete By Expires   (HEART FAILURE PATIENTS) Call MD:  Anytime you have any of the following symptoms: 1) 3 pound weight gain in 24 hours or 5 pounds in 1 week 2) shortness of breath, with or without a dry hacking cough 3) swelling in the hands, feet or stomach 4) if you  have to sleep on extra pillows at night in order to breathe.  As directed    Call MD for:  difficulty breathing, headache or visual disturbances  As directed    Call MD for:  extreme fatigue  As directed    Call MD for:  hives  As directed    Call MD for:  persistant dizziness or light-headedness  As directed    Call MD for:  persistant nausea and vomiting  As directed    Call MD for:  severe uncontrolled pain  As directed    Call MD for:  temperature >100.4  As directed    Diet - low sodium heart healthy  As directed    Discharge instructions  As directed    Increase activity slowly  As directed        Medication List         allopurinol 100 MG tablet  Commonly known as:  ZYLOPRIM  Take 1 tablet (100 mg total) by mouth  daily.     ASPIR-81 81 MG EC tablet  Generic drug:  aspirin  Take 81 mg by mouth daily.     atorvastatin 10 MG tablet  Commonly known as:  LIPITOR  Take 1 tablet (10 mg total) by mouth daily. Please schedule a physical, having fasting labs prior, for any additional refills prior.     colchicine 0.6 MG tablet  Take 0.6-1.2 mg by mouth daily as needed (for gout flare up).     diltiazem 120 MG 24 hr capsule  Commonly known as:  CARDIZEM CD  Take 1 capsule (120 mg total) by mouth daily.     escitalopram 10 MG tablet  Commonly known as:  LEXAPRO  Take 1 tablet (10 mg total) by mouth daily.     ferrous sulfate 325 (65 FE) MG tablet  Take 1 tablet (325 mg total) by mouth daily with breakfast.     flecainide 100 MG tablet  Commonly known as:  TAMBOCOR  Take 1 tablet (100 mg total) by mouth every 12 (twelve) hours.     furosemide 80 MG tablet  Commonly known as:  LASIX  Take 1 tablet (80 mg total) by mouth 2 (two) times daily.     ipratropium 17 MCG/ACT inhaler  Commonly known as:  ATROVENT HFA  Inhale 2 puffs into the lungs 4 (four) times daily.     metolazone 2.5 MG tablet  Commonly known as:  ZAROXOLYN  Take 1 tablet (2.5 mg total) by mouth daily.      NON FORMULARY  Oxygen 2.5 liters daily     oxyCODONE 10 MG 12 hr tablet  Commonly known as:  OXYCONTIN  Take 1 tablet (10 mg total) by mouth every 12 (twelve) hours as needed for pain.     potassium chloride SA 20 MEQ tablet  Commonly known as:  K-DUR,KLOR-CON  Take 2 tablets (40 mEq total) by mouth 3 (three) times daily.     VITAMIN C PO  Take 1 tablet by mouth daily.     warfarin 2.5 MG tablet  Commonly known as:  COUMADIN  Take 1.25-2.5 mg by mouth See admin instructions. Alternate every other day with 0.5 -1 tablet     ZYRTEC ALLERGY 10 MG tablet  Generic drug:  cetirizine  Take 10 mg by mouth daily.       Follow-up Information   Follow up with [facility]. (Resume Home Health Services: Registered Nurse and Physical Therapy Services; services to start within 24-48 hours of discharge)    Contact information:   [address] [place] [address] [phone]       Follow up with [name], MD. Schedule an appointment as soon as possible for a visit on 09/14/2013. (@10 :30 am spoke with Bonita QuinLinda )    Specialty:  Family Medicine   Contact information:   [address] [place] [address] [phone]       The results of significant diagnostics from this hospitalization (including imaging, microbiology, ancillary and laboratory) are listed below for reference.    Significant Diagnostic Studies: Dg Chest Port 1 View  08/31/2013   CLINICAL DATA:  Analys Ryden of breath, hypotension  EXAM: PORTABLE CHEST - 1 VIEW  COMPARISON:  01/19/2013  FINDINGS: Hypoventilation with elevated right hemidiaphragm and right lower lobe atelectasis.  Cardiac enlargement with mild vascular congestion. Small right effusion.  IMPRESSION: Hypoventilation. Elevated right hemidiaphragm with mild right lower lobe atelectasis  Congestive heart  failure with mild edema and small right pleural effusion.   Electronically Signed   By: Marlan Palau M.D.    On: 08/31/2013 16:41    Microbiology: Recent Results (from the past 240 hour(s))  MRSA PCR SCREENING     Status: None   Collection Time    08/31/13  8:32 PM      Result Value Ref Range Status   MRSA by PCR NEGATIVE  NEGATIVE Final   Comment:            The GeneXpert MRSA Assay (FDA     approved for NASAL specimens     only), is one component of a     comprehensive MRSA colonization     surveillance program. It is not     intended to diagnose MRSA     infection nor to guide or     monitor treatment for     MRSA infections.     Labs: Basic Metabolic Panel:  Recent Labs Lab 09/02/13 0116 09/02/13 1010 09/03/13 0522 09/03/13 1800 09/04/13 0550 09/05/13 0402 09/06/13 0528  NA 138  --  142  --  143 141 139  K 3.1*  --  2.7* 3.7 3.4* 3.3* 3.9  CL 88*  --  89*  --  94* 93* 96  CO2 31  --  37*  --  32 30 27  GLUCOSE 118*  --  86  --  96 95 108*  BUN 66*  --  54*  --  49* 46* 37*  CREATININE 1.64*  --  1.23*  --  1.29* 1.39* 1.26*  CALCIUM 9.4  --  10.0  --  9.9 9.6 9.7  MG  --  2.2  --   --   --  1.9  --    Liver Function Tests:  Recent Labs Lab 08/31/13 1631 09/01/13 0325 09/02/13 0116 09/06/13 0528  AST 372* 218* 104* 30  ALT 278* 246* 188* 57*  ALKPHOS 107 111 102 99  BILITOT 0.3 0.2* 0.2* 0.3  PROT 7.8 7.6 7.4 7.4  ALBUMIN 2.9* 2.8* 2.9* 3.1*   No results found for this basename: LIPASE, AMYLASE,  in the last 168 hours No results found for this basename: AMMONIA,  in the last 168 hours CBC:  Recent Labs Lab 08/31/13 1631 09/01/13 0325 09/02/13 0116 09/03/13 0522 09/04/13 0550  WBC 16.9* 14.7* 14.7* 10.2 8.9  HGB 10.8* 10.4* 10.4* 11.4* 11.4*  HCT 35.5* 33.4* 33.8* 37.8 38.8  MCV 91.3 89.8 90.4 92.0 92.6  PLT 338 322 331 381 350   Cardiac Enzymes:  Recent Labs Lab 09/01/13 0325 09/01/13 0947 09/05/13 1150 09/05/13 1628 09/05/13 2226  TROPONINI <0.30 <0.30 <0.30 <0.30 <0.30   BNP: BNP (last 3 results)  Recent Labs  08/31/13 1631   PROBNP 16475.0*   CBG:  Recent Labs Lab 08/31/13 2101  GLUCAP 145*    Time coordinating discharge: 45 minutes  Signed:  Renae Fickle  [facility] 09/06/2013, 4:22 PM

## 2013-09-02 NOTE — Progress Notes (Addendum)
[facility] PROGRESS NOTE  [name] [name] WUJ:[id number] DOB: [birth year] DOA: 08/31/2013 PCP: Kerby Nora, MD  Assessment/Plan  Acute respiratory failure with hypoxia secondary to acute on chronic diastolic heart failure - cont IV Lasix  - ECHO Feb 2015: normal  - suspect pulmonary sx's from renal failure related volume overload  - pt with low GFR and requires Lasix to maintain adequate UOP  - has weaned from CPAP to cannula  -  Wt down 11-lbs over 2 days -  -4.5L yesterday -  Tele: sinus brady -  Okay to d/c telemetry  Acute renal failure on CKD 4, creatinine improved with diuresis suggesting cardiorenal syndrome -needs to establish with nephrologist for OP management of chronic renal failure  -Baseline Cr of 1.6  -watch overcorrection of BP  - repeat creatinine in AM  Hypokalemia, recurred due to diuresis - start daily potassium repletion - Check magnesium level  Transaminitis  -TB normal so likely due to passive congestion especially since trend is down since admit - repeat in 2 days  Anemia CKD  -hgb 10 - consider eval for Epogen  -cont Iron PO   Afib, rate controlled - Cont cardizem as tolerated - BP soft  - On coumadin - sub therapeutic so cont heparin bridge   HTN, BP stable - Cont home meds   Allergic rhinitis -  Start claritin and atrovent and resume allegra at discharge  Gout flare, stable.  Obesity, Stable  Leukocytosis, resolving with diuresis, no evidence of underlying infection -  Chest x-ray negative for pneumonia -  Urinalysis negative  Chronic wound right shin after hematoma last year -  Wound care consultation  Diet:  Healthy heart Access:  PIV IVF:  off Proph:  Heparin gtt until INR therapeutic  Code Status: Full code Family Communication: Patient alone Disposition Plan: Pending further diuresis, still has rales on exam   Consultants:  None  Procedures:  Chest x-ray  Antibiotics:  None   HPI/Subjective:  States  her breathing is much better.  Uses 2-3L [place] at home and currently on 2L.  Denies cough.  Chest tightness and wheeze improved  Objective: Filed Vitals:   09/01/13 2033 09/02/13 0005 09/02/13 0158 09/02/13 0637  BP: 118/59  125/59 137/59  Pulse: 71 74 61 58  Temp: 97.9 F (36.6 C)  97.8 F (36.6 C) 97.9 F (36.6 C)  TempSrc: Oral  Oral Oral  Resp: 20 18 18 18   Height:      Weight:    122.653 kg (270 lb 6.4 oz)  SpO2: 94% 95% 94% 94%    Intake/Output Summary (Last 24 hours) at 09/02/13 0739 Last data filed at 09/02/13 0640  Gross per 24 hour  Intake   1105 ml  Output   5600 ml  Net  -4495 ml   Filed Weights   09/01/13 0415 09/01/13 1633 09/02/13 0637  Weight: 127.5 kg (281 lb 1.4 oz) 125.556 kg (276 lb 12.8 oz) 122.653 kg (270 lb 6.4 oz)    Exam:   General:  Obese CF, No acute distress  HEENT:  NCAT, MMM  Cardiovascular:  Distant heart sounds, RR, nl S1, S2 no mrg, 2+ pulses, warm extremities  Respiratory:  Fine rales to mid-back bilaterally, no increased WOB  Abdomen:   NABS, soft, NT/ND  MSK:   Normal tone and bulk, trace LEE, right lower extremity with dressing >> chronic healing ulcer over last year  Neuro:  Grossly intact  Data Reviewed: Basic Metabolic Panel:  Recent Labs  Lab 08/31/13 1631 09/01/13 0325 09/02/13 0116  NA 133* 137 138  K 4.9 4.1 3.1*  CL 88* 91* 88*  CO2 23 26 31   GLUCOSE 115* 147* 118*  BUN 50* 56* 66*  CREATININE 2.84* 2.43* 1.64*  CALCIUM 9.3 9.2 9.4   Liver Function Tests:  Recent Labs Lab 08/31/13 1631 09/01/13 0325 09/02/13 0116  AST 372* 218* 104*  ALT 278* 246* 188*  ALKPHOS 107 111 102  BILITOT 0.3 0.2* 0.2*  PROT 7.8 7.6 7.4  ALBUMIN 2.9* 2.8* 2.9*   No results found for this basename: LIPASE, AMYLASE,  in the last 168 hours No results found for this basename: AMMONIA,  in the last 168 hours CBC:  Recent Labs Lab 08/31/13 1631 09/01/13 0325 09/02/13 0116  WBC 16.9* 14.7* 14.7*  HGB 10.8* 10.4* 10.4*   HCT 35.5* 33.4* 33.8*  MCV 91.3 89.8 90.4  PLT 338 322 331   Cardiac Enzymes:  Recent Labs Lab 08/31/13 2205 09/01/13 0325 09/01/13 0947  TROPONINI <0.30 <0.30 <0.30   BNP (last 3 results)  Recent Labs  08/31/13 1631  PROBNP 16475.0*   CBG:  Recent Labs Lab 08/31/13 2101  GLUCAP 145*    Recent Results (from the past 240 hour(s))  MRSA PCR SCREENING     Status: None   Collection Time    08/31/13  8:32 PM      Result Value Ref Range Status   MRSA by PCR NEGATIVE  NEGATIVE Final   Comment:            The GeneXpert MRSA Assay (FDA     approved for NASAL specimens     only), is one component of a     comprehensive MRSA colonization     surveillance program. It is not     intended to diagnose MRSA     infection nor to guide or     monitor treatment for     MRSA infections.     Studies: Dg Chest Port 1 View  08/31/2013   CLINICAL DATA:  [name] [name] of breath, hypotension  EXAM: PORTABLE CHEST - 1 VIEW  COMPARISON:  01/19/2013  FINDINGS: Hypoventilation with elevated right hemidiaphragm and right lower lobe atelectasis.  Cardiac enlargement with mild vascular congestion. Small right effusion.  IMPRESSION: Hypoventilation. Elevated right hemidiaphragm with mild right lower lobe atelectasis  Congestive heart failure with mild edema and small right pleural effusion.   Electronically Signed   By: Marlan Palauharles  Clark M.D.   On: 08/31/2013 16:41    Scheduled Meds: . aspirin  81 mg Oral Daily  . atorvastatin  10 mg Oral q1800  . diltiazem  120 mg Oral Daily  . escitalopram  10 mg Oral Daily  . ferrous sulfate  325 mg Oral Q breakfast  . flecainide  100 mg Oral Q12H  . furosemide  40 mg Intravenous BID  . metolazone  2.5 mg Oral Daily  . potassium chloride  40 mEq Oral Daily  . warfarin  5 mg Oral ONCE-1800  . Warfarin - Pharmacist Dosing Inpatient   Does not apply q1800   Continuous Infusions: . heparin 1,950 Units/hr (09/02/13 0204)    Active Problems:   OBESITY-MORBID  (>100')   HYPERTENSION   Atrial fibrillation   DIASTOLIC HEART FAILURE, CHRONIC   Gout   CKD (chronic kidney disease) stage 3, GFR 30-59 ml/min   Syncope and collapse   Acute exacerbation of CHF (congestive heart failure)   CHF exacerbation    Time  spent: 30 min    [name] [name]  [facility] Pager [phone]. If 7PM-7AM, please contact night-coverage at [email or web address], password [email or web address] 09/02/2013, 7:39 AM  LOS: 2 days

## 2013-09-02 NOTE — Progress Notes (Signed)
ANTICOAGULATION CONSULT NOTE - Follow Up Consult  Pharmacy Consult for Heparin/Coumadin Indication: atrial fibrillation  Allergies  Allergen Reactions  . Prednisone     Eyes swelling    Patient Measurements: Height: 5\' 4"  (162.6 cm) Weight: 270 lb 6.4 oz (122.653 kg) (SCALE a) IBW/kg (Calculated) : 54.7 Heparin Dosing Weight: 85.5 kg  Vital Signs: Temp: 97.9 F (36.6 C) (04/23 0820) Temp src: Oral (04/23 0820) BP: 135/62 mmHg (04/23 0820) Pulse Rate: 64 (04/23 0820)  Labs:  Recent Labs  08/31/13 1631 08/31/13 2205  09/01/13 0325 09/01/13 0947 09/01/13 1545 09/02/13 0116 09/02/13 1010  HGB 10.8*  --   --  10.4*  --   --  10.4*  --   HCT 35.5*  --   --  33.4*  --   --  33.8*  --   PLT 338  --   --  322  --   --  331  --   LABPROT 15.1  --   --  14.8  --   --  15.6*  --   INR 1.22  --   --  1.19  --   --  1.27  --   HEPARINUNFRC  --   --   < > <0.10*  --  0.13* 0.18* 0.35  CREATININE 2.84*  --   --  2.43*  --   --  1.64*  --   TROPONINI  --  <0.30  --  <0.30 <0.30  --   --   --   < > = values in this interval not displayed.  Estimated Creatinine Clearance: 36 ml/min (by C-G formula based on Cr of 1.64).   Assessment: Weight gain and pulmonary edema. [age] YOF with Afib on Coumadin PTA. INR 1.22 on 1.25mg /2.5mg  alternating.  PMH: CHF, afib, HTN, gout, obesity., CKD  AC: Afib. Heparin level 0.35. INR today 1.27  ID: no Abx, Afeb, WBC 14.7   CV: CHF, HTN,  VSS. Meds: ASA81, Lipitor10, Diltiazem 120, Flecainide, IV Lasix, Zaroxolyn, K+  Endo: glu 118, Gout  GI/Nutr: obesity, AST/ALT inc likely passive congestion  Neuro:   Renal: CKD: SrC 2.43>>1.64. K low at 3.1.  Pulm: 02 dependant at home, Atrovent nasal, Claritin for allergic rhinitis  Heme/Onc: Anemia, Hg 10.4, PTLC 331  Other: allopurinol, colchicine, oxycontin,  Goal of Therapy:  INR 2-3 Monitor platelets by anticoagulation protocol: Yes   Plan:  Continue IV heparin at 1950 units/hr Coumadin  5mg  po x 1 tonight.   [name] [name], PharmD, [facility] Clinical Staff Pharmacist Pager [phone]  [facility] IncCrystal [name] Merilynn [name] 09/02/2013,10:55 AM

## 2013-09-02 NOTE — Progress Notes (Signed)
ANTICOAGULATION CONSULT NOTE  Pharmacy Consult for Heparin Indication: Hx Afib  Allergies  Allergen Reactions  . Prednisone     Eyes swelling    Patient Measurements: Height: 5\' 4"  (162.6 cm) Weight: 276 lb 12.8 oz (125.556 kg) (pt unable to stand) IBW/kg (Calculated) : 54.7 Heparin Dosing Weight: 85.5 kg  Vital Signs: Temp: 97.9 F (36.6 C) (04/22 2033) Temp src: Oral (04/22 2033) BP: 118/59 mmHg (04/22 2033) Pulse Rate: 74 (04/23 0005)  Labs:  Recent Labs  08/31/13 1631 08/31/13 2205 09/01/13 0325 09/01/13 0947 09/01/13 1545 09/02/13 0116  HGB 10.8*  --  10.4*  --   --  10.4*  HCT 35.5*  --  33.4*  --   --  33.8*  PLT 338  --  322  --   --  331  LABPROT 15.1  --  14.8  --   --  15.6*  INR 1.22  --  1.19  --   --  1.27  HEPARINUNFRC  --   --  <0.10*  --  0.13* 0.18*  CREATININE 2.84*  --  2.43*  --   --   --   TROPONINI  --  <0.30 <0.30 <0.30  --   --     Estimated Creatinine Clearance: 24.6 ml/min (by C-G formula based on Cr of 2.43).  Assessment: 80 yo female with Afib for anticoagulation  Goal of Therapy:  Heparin level 0.3-0.7 units/ml Monitor platelets by anticoagulation protocol: Yes   Plan:  Heparin 2000 units IV bolus, then increase heparin 1950 units/hr Check heparin level in 8 hours.  Coumadin 5 mg today  Geannie RisenGreg Kimie Pidcock, PharmD, BCPS  09/02/2013 1:53 AM

## 2013-09-02 NOTE — Progress Notes (Signed)
Pt is using her home CPAP machine and full face mask. Home settings are 7cmH2O with 3lpm of oxygen, however the pt's SpO2 was 79% on 3L. Increased oxygen to 10lpm to maintain SpO2 >92%. Sterile water was added for humidification. Pt looks comfortable and is tolerating CPAP well at this time. RT will continue to monitor as needed.

## 2013-09-03 LAB — CBC
HCT: 37.8 % (ref 36.0–46.0)
Hemoglobin: 11.4 g/dL — ABNORMAL LOW (ref 12.0–15.0)
MCH: 27.7 pg (ref 26.0–34.0)
MCHC: 30.2 g/dL (ref 30.0–36.0)
MCV: 92 fL (ref 78.0–100.0)
Platelets: 381 10*3/uL (ref 150–400)
RBC: 4.11 MIL/uL (ref 3.87–5.11)
RDW: 21.7 % — AB (ref 11.5–15.5)
WBC: 10.2 10*3/uL (ref 4.0–10.5)

## 2013-09-03 LAB — BASIC METABOLIC PANEL
BUN: 54 mg/dL — AB (ref 6–23)
CO2: 37 mEq/L — ABNORMAL HIGH (ref 19–32)
CREATININE: 1.23 mg/dL — AB (ref 0.50–1.10)
Calcium: 10 mg/dL (ref 8.4–10.5)
Chloride: 89 mEq/L — ABNORMAL LOW (ref 96–112)
GFR, EST AFRICAN AMERICAN: 47 mL/min — AB (ref >90)
GFR, EST NON AFRICAN AMERICAN: 41 mL/min — AB (ref >90)
Glucose, Bld: 86 mg/dL (ref 70–99)
Potassium: 2.7 mEq/L — CL (ref 3.7–5.3)
Sodium: 142 mEq/L (ref 137–147)

## 2013-09-03 LAB — HEPARIN LEVEL (UNFRACTIONATED): Heparin Unfractionated: 0.42 IU/mL (ref 0.30–0.70)

## 2013-09-03 LAB — POTASSIUM: Potassium: 3.7 mEq/L (ref 3.7–5.3)

## 2013-09-03 LAB — PROTIME-INR
INR: 1.66 — AB (ref 0.00–1.49)
PROTHROMBIN TIME: 19.1 s — AB (ref 11.6–15.2)

## 2013-09-03 MED ORDER — POTASSIUM CHLORIDE CRYS ER 20 MEQ PO TBCR
40.0000 meq | EXTENDED_RELEASE_TABLET | Freq: Once | ORAL | Status: AC
  Administered 2013-09-03: 40 meq via ORAL
  Filled 2013-09-03: qty 2

## 2013-09-03 MED ORDER — POTASSIUM CHLORIDE CRYS ER 20 MEQ PO TBCR
40.0000 meq | EXTENDED_RELEASE_TABLET | Freq: Once | ORAL | Status: AC
  Administered 2013-09-03: 40 meq via ORAL

## 2013-09-03 MED ORDER — POTASSIUM CHLORIDE 10 MEQ/100ML IV SOLN
10.0000 meq | INTRAVENOUS | Status: DC
  Filled 2013-09-03 (×2): qty 100

## 2013-09-03 MED ORDER — WARFARIN SODIUM 4 MG PO TABS
4.0000 mg | ORAL_TABLET | Freq: Once | ORAL | Status: AC
  Administered 2013-09-03: 4 mg via ORAL
  Filled 2013-09-03: qty 1

## 2013-09-03 MED ORDER — ENOXAPARIN SODIUM 120 MG/0.8ML ~~LOC~~ SOLN
120.0000 mg | Freq: Two times a day (BID) | SUBCUTANEOUS | Status: DC
  Administered 2013-09-04 – 2013-09-06 (×6): 120 mg via SUBCUTANEOUS
  Filled 2013-09-03 (×7): qty 0.8

## 2013-09-03 MED ORDER — FUROSEMIDE 40 MG PO TABS
40.0000 mg | ORAL_TABLET | Freq: Two times a day (BID) | ORAL | Status: DC
  Administered 2013-09-03 – 2013-09-04 (×3): 40 mg via ORAL
  Filled 2013-09-03 (×5): qty 1

## 2013-09-03 NOTE — Progress Notes (Signed)
I co-sign Demarcus notes and assessments

## 2013-09-03 NOTE — Evaluation (Signed)
Physical Therapy Evaluation Patient Details Name: [name] [name] MRN: [id number] DOB: 10/02/1931 Today's Date: 09/03/2013   History of Present Illness  With a hx of chronic diastolic heart failure, afib on coumadin, htn, gout, obesity, and stage 3 ckd who initially presented to her PCP one week prior, found to have low K. Pt was instructed to hold diuretics temporarily. Pt then noted increased sob and decreased urine output with 6 pound wt gain over the past 2 days. She presented to clinic on day of admit, found to be mildly hypotensive and was referred to the Ed.  Clinical Impression  Pt adm due to the above. Presents with decreased independence with mobility primarily to overall deconditioning. Pt to benefit from skilled acute PT at this time to maximize functional mobility prior to D/C home with family. Pt with good family support. Will recommend HHPT upon acute PT at this time.     Follow Up Recommendations Home health PT;Supervision/Assistance - 24 hour    Equipment Recommendations  None recommended by PT    Recommendations for Other Services       Precautions / Restrictions Precautions Precautions: Fall Precaution Comments: reports she had falls many years ago; denies any recent falls  Restrictions Weight Bearing Restrictions: No      Mobility  Bed Mobility Overal bed mobility: Modified Independent             General bed mobility comments: use of handrail and incr time to perform bed mobility   Transfers Overall transfer level: Needs assistance Equipment used: Rolling walker (2 wheeled) Transfers: Sit to/from Stand Sit to Stand: Min guard         General transfer comment: min guard to steady and cues for hand placement and sequencing; pt requesting to use BSC; relies on armrests for transfers   Ambulation/Gait Ambulation/Gait assistance: Supervision Ambulation Distance (Feet): 20 Feet Assistive device: Rolling walker (2 wheeled) Gait Pattern/deviations:  Step-through pattern;Wide base of support;Trunk flexed Gait velocity: decreased  Gait velocity interpretation: Below normal speed for age/gender General Gait Details: supervision for safety and sequencing with RW  Stairs            Wheelchair Mobility    Modified Rankin (Stroke Patients Only)       Balance Overall balance assessment: Needs assistance Sitting-balance support: Feet supported;No upper extremity supported Sitting balance-Leahy Scale: Fair     Standing balance support: During functional activity;Bilateral upper extremity supported Standing balance-Leahy Scale: Poor Standing balance comment: bil UES supported by RW                             Pertinent Vitals/Pain No c/o pain during session     Home Living Family/patient expects to be discharged to:: Private residence Living Arrangements: Children Available Help at Discharge: Family;Personal care attendant;Available 24 hours/day Type of Home: House Home Access: Stairs to enter Entrance Stairs-Rails: Left;Right;Can reach both Secretary/administratorntrance Stairs-Number of Steps: 2 Home Layout: One level Home Equipment: Shower seat - built in;Grab bars - tub/shower;Walker - 2 wheels;Transport chair;Bedside commode Additional Comments: Son lives with her; has aide and HH daily     Prior Function Level of Independence: Needs assistance   Gait / Transfers Assistance Needed: reports she ambulates with RW as needed   ADL's / Homemaking Assistance Needed: (A) with cooking/cleaning; reports she can dress and bathe herself         Hand Dominance   Dominant Hand: Right    Extremity/Trunk  Assessment   Upper Extremity Assessment: Defer to OT evaluation           Lower Extremity Assessment: Generalized weakness      Cervical / Trunk Assessment: Kyphotic  Communication   Communication: No difficulties  Cognition Arousal/Alertness: Awake/alert Behavior During Therapy: WFL for tasks  assessed/performed Overall Cognitive Status: Within Functional Limits for tasks assessed                      General Comments General comments (skin integrity, edema, etc.): ambulating on 3L O2    Exercises General Exercises - Lower Extremity Ankle Circles/Pumps: AROM;Both;10 reps;Seated      Assessment/Plan    PT Assessment Patient needs continued PT services  PT Diagnosis Generalized weakness;Abnormality of gait   PT Problem List Decreased strength;Decreased balance;Decreased mobility;Decreased knowledge of use of DME;Obesity  PT Treatment Interventions DME instruction;Gait training;Functional mobility training;Therapeutic exercise;Therapeutic activities;Balance training;Neuromuscular re-education;Patient/family education   PT Goals (Current goals can be found in the Care Plan section) Acute Rehab PT Goals Patient Stated Goal: to go home tomorrow  PT Goal Formulation: With patient Time For Goal Achievement: 09/10/13 Potential to Achieve Goals: Good    Frequency Min 3X/week   Barriers to discharge        Co-evaluation               End of Session Equipment Utilized During Treatment: Gait belt;Oxygen Activity Tolerance: Patient tolerated treatment well Patient left: in chair;with call bell/phone within reach Nurse Communication: Mobility status         Time: [id number] PT Time Calculation (min): 17 min   Charges:   PT Evaluation $Initial PT Evaluation Tier I: 1 Procedure PT Treatments $Gait Training: 8-22 mins   PT G CodesNadara Mustard:          Lynlee Stratton N [place], [place] CarolinaPT [phone] 09/03/2013, 12:58 PM

## 2013-09-03 NOTE — Progress Notes (Signed)
[facility] PROGRESS NOTE  [name] [name] WUJ:[id number] DOB: [birth year] DOA: 08/31/2013 PCP: Kerby Nora, MD  Assessment/Plan  Acute respiratory failure with hypoxia secondary to acute on chronic diastolic heart failure.  Telemetry demonstrated normal sinus rhythm. - Decrease to oral Lasix today  - ECHO Feb 2015: normal  - suspect pulmonary sx's from renal failure related volume overload  - pt with low GFR and requires Lasix to maintain adequate UOP  - has weaned from CPAP to cannula  -  Wt down 15 lbs over 3 days -  -2.5L yesterday  Acute renal failure on CKD 4, creatinine improved with diuresis suggesting cardiorenal syndrome -needs to establish with nephrologist for OP management of chronic renal failure  -Baseline Cr of 1.6, but currently 1.23 -watch overcorrection of BP  - repeat creatinine in AM  Hypokalemia, recurred due to diuresis, decreased despite increasing potassium yesterday - Give additional 80 mEq of potassium -  Repeat potassium level this afternoon - magnesium level greater than 2  Transaminitis  -TB normal so likely due to passive congestion especially since trend is down since admit - repeat in 2 days  Anemia CKD  -hgb 10 - consider eval for Epogen  -cont Iron PO   Afib, rate controlled, off telemetry - Cont cardizem as tolerated - BP soft  - On coumadin - sub therapeutic so cont heparin bridge   HTN, BP stable - Cont home meds   Allergic rhinitis -  Start claritin and atrovent and resume allegra at discharge  Gout flare, stable.  Obesity, Stable  Leukocytosis, resolving with diuresis, no evidence of underlying infection -  Chest x-ray negative for pneumonia -  Urinalysis negative  Chronic wound right shin after hematoma last year -  Wound care consultation  Diet:  Healthy heart Access:  PIV IVF:  off Proph:  Heparin gtt until INR therapeutic  Code Status: Full code Family Communication: Patient alone Disposition Plan: Pending  correction of hypokalemia and further diuresis.  Still wheezing on exam and persistent hypoxia.  Home in several more days.    Consultants:  None  Procedures:  Chest x-ray  Antibiotics:  None   HPI/Subjective:  States her breathing is about the same.  Up to 4L yesterday.  Uses 2-3L [place] at home.  Burning in arm from IV potassium.  Pooping okay.     Objective: Filed Vitals:   09/03/13 0215 09/03/13 0246 09/03/13 0434 09/03/13 0508  BP: 120/54   141/75  Pulse: 62   71  Temp: 97.6 F (36.4 C)     TempSrc: Oral     Resp: 18   18  Height:      Weight:   121.02 kg (266 lb 12.8 oz)   SpO2: 88% 92%  92%    Intake/Output Summary (Last 24 hours) at 09/03/13 0757 Last data filed at 09/03/13 9562  Gross per 24 hour  Intake 1687.68 ml  Output   4275 ml  Net -2587.32 ml   Filed Weights   09/01/13 1633 09/02/13 0637 09/03/13 0434  Weight: 125.556 kg (276 lb 12.8 oz) 122.653 kg (270 lb 6.4 oz) 121.02 kg (266 lb 12.8 oz)    Exam:   General:  Obese CF, No acute distress  HEENT:  NCAT, MMM  Cardiovascular:  Distant heart sounds, RR, nl S1, S2 no mrg, 2+ pulses, warm extremities  Respiratory:  Rales improved but still wheezing bilaterally, no rhonchi, no increased WOB  Abdomen:   NABS, soft, NT/ND  MSK:  Normal tone and bulk, no LEE, right lower extremity with dressing >> chronic healing ulcer over last year  Neuro:  Grossly intact  Data Reviewed: Basic Metabolic Panel:  Recent Labs Lab 08/31/13 1631 09/01/13 0325 09/02/13 0116 09/02/13 1010 09/03/13 0522  NA 133* 137 138  --  142  K 4.9 4.1 3.1*  --  2.7*  CL 88* 91* 88*  --  89*  CO2 23 26 31   --  37*  GLUCOSE 115* 147* 118*  --  86  BUN 50* 56* 66*  --  54*  CREATININE 2.84* 2.43* 1.64*  --  1.23*  CALCIUM 9.3 9.2 9.4  --  10.0  MG  --   --   --  2.2  --    Liver Function Tests:  Recent Labs Lab 08/31/13 1631 09/01/13 0325 09/02/13 0116  AST 372* 218* 104*  ALT 278* 246* 188*  ALKPHOS 107 111  102  BILITOT 0.3 0.2* 0.2*  PROT 7.8 7.6 7.4  ALBUMIN 2.9* 2.8* 2.9*   No results found for this basename: LIPASE, AMYLASE,  in the last 168 hours No results found for this basename: AMMONIA,  in the last 168 hours CBC:  Recent Labs Lab 08/31/13 1631 09/01/13 0325 09/02/13 0116 09/03/13 0522  WBC 16.9* 14.7* 14.7* 10.2  HGB 10.8* 10.4* 10.4* 11.4*  HCT 35.5* 33.4* 33.8* 37.8  MCV 91.3 89.8 90.4 92.0  PLT 338 322 331 381   Cardiac Enzymes:  Recent Labs Lab 08/31/13 2205 09/01/13 0325 09/01/13 0947  TROPONINI <0.30 <0.30 <0.30   BNP (last 3 results)  Recent Labs  08/31/13 1631  PROBNP 16475.0*   CBG:  Recent Labs Lab 08/31/13 2101  GLUCAP 145*    Recent Results (from the past 240 hour(s))  MRSA PCR SCREENING     Status: None   Collection Time    08/31/13  8:32 PM      Result Value Ref Range Status   MRSA by PCR NEGATIVE  NEGATIVE Final   Comment:            The GeneXpert MRSA Assay (FDA     approved for NASAL specimens     only), is one component of a     comprehensive MRSA colonization     surveillance program. It is not     intended to diagnose MRSA     infection nor to guide or     monitor treatment for     MRSA infections.     Studies: No results found.  Scheduled Meds: . aspirin  81 mg Oral Daily  . atorvastatin  10 mg Oral q1800  . diltiazem  120 mg Oral Daily  . escitalopram  10 mg Oral Daily  . ferrous sulfate  325 mg Oral Q breakfast  . flecainide  100 mg Oral Q12H  . furosemide  40 mg Oral BID  . ipratropium  2 spray Each Nare BID  . loratadine  10 mg Oral Daily  . potassium chloride  10 mEq Intravenous Q1 Hr x 2  . potassium chloride  40 mEq Oral Daily  . Warfarin - Pharmacist Dosing Inpatient   Does not apply q1800   Continuous Infusions: . heparin 1,950 Units/hr (09/03/13 0415)    Active Problems:   OBESITY-MORBID (>100')   HYPERTENSION   Atrial fibrillation   DIASTOLIC HEART FAILURE, CHRONIC   Gout   CKD (chronic  kidney disease) stage 3, GFR 30-59 ml/min   Syncope and collapse  Acute exacerbation of CHF (congestive heart failure)   CHF exacerbation    Time spent: 30 min    Renae FickleMackenzie Dwyane Dupree  [facility] Pager [phone]. If 7PM-7AM, please contact night-coverage at [email or web address], password [facility] 09/03/2013, 7:57 AM  LOS: 3 days

## 2013-09-03 NOTE — ED Provider Notes (Signed)
I saw and evaluated the patient, reviewed the resident's note and I agree with the findings and plan.   EKG Interpretation   Date/Time:  Tuesday August 31 2013 16:04:22 EDT Ventricular Rate:  50 PR Interval:    QRS Duration: 111 QT Interval:  448 QTC Calculation: 408 R Axis:   -16 Text Interpretation:  Sinus rhythm Premature atrial complexes Borderline  left axis deviation Abnormal R-wave progression, early transition  Borderline repolarization abnormality No significant change since last  tracing Confirmed by Adair Lauderback  MD-J, Huriel Matt ([id number]) on 08/31/2013 4:07:42 PM     PT with history of CHF, A fib.  Presented with worsening shortness of breath, hypoxia.  Seen at PCP office and sent for admission.  Xrays and labs consistent with CHF exacerbation.  Lasix IV given in the ED.  Admitted for further treatment.   Celene KrasJon R Yael Angerer, MD 09/03/13 [phone]

## 2013-09-03 NOTE — Progress Notes (Signed)
CRITICAL VALUE ALERT  Critical value received:  Potassium = 2.7  Date of notification:  09/03/2012  Time of notification:  0649 am  Critical value read back:yes  Nurse who received alert:  A. Christell ConstantMoore, RN  MD notified (1st page):  Schorr, NP  Time of first page:  [phone] am  RN will continue to monitor. Louretta ParmaAshley Stellarose Cerny, RN

## 2013-09-03 NOTE — Progress Notes (Signed)
Pt heparin drip d/c per MD order. K+ was 2.7 and. Pt was given 

## 2013-09-03 NOTE — Discharge Instructions (Signed)
Information on my medicine - Coumadin   (Warfarin)  This medication education was reviewed with me or my healthcare representative as part of my discharge preparation.  The pharmacist that spoke with me during my hospital stay was:  Pasty Spillersrystal Stillinger Mayra Brahm, [facility]RPH  Why was Coumadin prescribed for you? Coumadin was prescribed for you because you have a blood clot or a medical condition that can cause an increased risk of forming blood clots. Blood clots can cause serious health problems by blocking the flow of blood to the heart, lung, or brain. Coumadin can prevent harmful blood clots from forming. As a reminder your indication for Coumadin is:   Stroke Prevention Because Of Atrial Fibrillation  What test will check on my response to Coumadin? While on Coumadin (warfarin) you will need to have an INR test regularly to ensure that your dose is keeping you in the desired range. The INR (international normalized ratio) number is calculated from the result of the laboratory test called prothrombin time (PT).  If an INR APPOINTMENT HAS NOT ALREADY BEEN MADE FOR YOU please schedule an appointment to have this lab work done by your health care provider within 7 days. Your INR goal is usually a number between:  2 to 3 or your provider may give you a more narrow range like 2-2.5.  Ask your health care provider during an office visit what your goal INR is.  What  do you need to  know  About  COUMADIN? Take Coumadin (warfarin) exactly as prescribed by your healthcare provider about the same time each day.  DO NOT stop taking without talking to the doctor who prescribed the medication.  Stopping without other blood clot prevention medication to take the place of Coumadin may increase your risk of developing a new clot or stroke.  Get refills before you run out.  What do you do if you miss a dose? If you miss a dose, take it as soon as you remember on the same day then continue your regularly scheduled  regimen the next day.  Do not take two doses of Coumadin at the same time.  Important Safety Information A possible side effect of Coumadin (Warfarin) is an increased risk of bleeding. You should call your healthcare provider right away if you experience any of the following:   Bleeding from an injury or your nose that does not stop.   Unusual colored urine (red or dark brown) or unusual colored stools (red or black).   Unusual bruising for unknown reasons.   A serious fall or if you hit your head (even if there is no bleeding).  Some foods or medicines interact with Coumadin (warfarin) and might alter your response to warfarin. To help avoid this:   Eat a balanced diet, maintaining a consistent amount of Vitamin K.   Notify your provider about major diet changes you plan to make.   Avoid alcohol or limit your intake to 1 drink for women and 2 drinks for men per day. (1 drink is 5 oz. wine, 12 oz. beer, or 1.5 oz. liquor.)  Make sure that ANY health care provider who prescribes medication for you knows that you are taking Coumadin (warfarin).  Also make sure the healthcare provider who is monitoring your Coumadin knows when you have started a new medication including herbals and non-prescription products.  Coumadin (Warfarin)  Major Drug Interactions  Increased Warfarin Effect Decreased Warfarin Effect  Alcohol (large quantities) Antibiotics (esp. Septra/Bactrim, Flagyl, Cipro) Amiodarone (Cordarone) Aspirin (  ASA) °Cimetidine (Tagamet) °Megestrol (Megace) °NSAIDs (ibuprofen, naproxen, etc.) °Piroxicam (Feldene) °Propafenone (Rythmol SR) °Propranolol (Inderal) °Isoniazid (INH) °Posaconazole (Noxafil) Barbiturates (Phenobarbital) °Carbamazepine (Tegretol) °Chlordiazepoxide (Librium) °Cholestyramine (Questran) °Griseofulvin °Oral Contraceptives °Rifampin °Sucralfate (Carafate) °Vitamin K  ° °Coumadin® (Warfarin) Major Herbal Interactions  °Increased Warfarin Effect Decreased Warfarin Effect    °Garlic °Ginseng °Ginkgo biloba Coenzyme Q10 °Green tea °St. John’s wort   ° °Coumadin® (Warfarin) FOOD Interactions  °Eat a consistent number of servings per week of foods HIGH in Vitamin K °(1 serving = ½ cup)  °Collards (cooked, or boiled & drained) °Kale (cooked, or boiled & drained) °Mustard greens (cooked, or boiled & drained) °Parsley *serving size only = ¼ cup °Spinach (cooked, or boiled & drained) °Swiss chard (cooked, or boiled & drained) °Turnip greens (cooked, or boiled & drained)  °Eat a consistent number of servings per week of foods MEDIUM-HIGH in Vitamin K °(1 serving = 1 cup)  °Asparagus (cooked, or boiled & drained) °Broccoli (cooked, boiled & drained, or raw & chopped) °Brussel sprouts (cooked, or boiled & drained) *serving size only = ½ cup °Lettuce, raw (green leaf, endive, romaine) °Spinach, raw °Turnip greens, raw & chopped  ° °These websites have more information on Coumadin (warfarin):  www.coumadin.com; °www.ahrq.gov/consumer/coumadin.htm; ° ° ° °

## 2013-09-03 NOTE — Care Management Note (Addendum)
  Page 2 of 2   09/06/2013     10:37:52 AM CARE MANAGEMENT NOTE 09/06/2013  Patient:  [name] [name],[name] [name]   Account Number:  [id number]  Date Initiated:  09/02/2013  Documentation initiated by:  Ishaan Villamar  Subjective/Objective Assessment:   Admitted with acute exacerbation of CHF     Action/Plan:   CM to follow for dispositon needs   Anticipated DC Date:  09/06/2013   Anticipated DC Plan:  HOME W HOME HEALTH SERVICES      DC Planning Services  CM consult      [facility]  [facility]   Choice offered to / List presented to:  C-1 Patient        HH arranged  HH-1 RN  HH-2 PT  HH-10 DISEASE MANAGEMENT      HH agency  [facility]   Status of service:  Completed, signed off Medicare Important Message given?   (If response is "NO", the following Medicare IM given date fields will be blank) Date Medicare IM given:   Date Additional Medicare IM given:    Discharge Disposition:  HOME W HOME HEALTH SERVICES  Per UR Regulation:  Reviewed for med. necessity/level of care/duration of stay  If discussed at Long Length of Stay Meetings, dates discussed:    Comments:  [facility]/2013 [facility] for [facility] of services. Dispostion:  Home with HHS: Resume RN, PT services; Home O2 and CPap already [facility] Alyson Ki RN, BSN, [place], [place] 09/06/2013  09/03/2013  CM Consult Note: Socail:  From home with son.  Support system of son and sister. PCG:  Son Transportation:  Son Medications:  Self managed; denies compliance issues and uses med box.  MCR D coverage [facility] D[facility]:  cane, walker, BSC, shower Seat, Nebulizer, CPap, Oxygen, O2 Sat monitor, BP monitor CM reminded patient that family will need to bring portable tank from home for d/c when needed. HHS:  Active with [name] for RN/PT services Dispositon Plan: Resume HHS at discharge. Wound MGMT Leg - hx/o hematoma 06/2012 causing wound Patient confirms LTG plan  of OP  Pulmonary Rehab once wound is healed.  (Recommended by Pulmonologist). Excell SeltzerAmy E Bedsole, MD PCP Phone: [phone]; Fax: [phone] Cardiologist:  Dr. Dossie Arbourim Gollan, MD - [facility] [address] [place], [place] [address] [phone]([phone] (Fax) Rhiann Boucher RN, BSN, [place], [place]CCM 09/03/2013

## 2013-09-03 NOTE — Progress Notes (Addendum)
ANTICOAGULATION CONSULT NOTE - Follow Up Consult  Pharmacy Consult for Lovenox/Coumadin Indication: atrial fibrillation  Allergies  Allergen Reactions  . Prednisone     Eyes swelling    Patient Measurements: Height: 5\' 4"  (162.6 cm) Weight: 266 lb 12.8 oz (121.02 kg) (a scale) IBW/kg (Calculated) : 54.7 Heparin Dosing Weight: 85.5 kg  Vital Signs: Temp: 97.6 F (36.4 C) (04/24 0215) Temp src: Oral (04/24 0215) BP: 141/75 mmHg (04/24 0508) Pulse Rate: 71 (04/24 0508)  Labs:  Recent Labs  08/31/13 2205 09/01/13 0325 09/01/13 0947  09/02/13 0116 09/02/13 1010 09/03/13 0522  HGB  --  10.4*  --   --  10.4*  --  11.4*  HCT  --  33.4*  --   --  33.8*  --  37.8  PLT  --  322  --   --  331  --  381  LABPROT  --  14.8  --   --  15.6*  --  19.1*  INR  --  1.19  --   --  1.27  --  1.66*  HEPARINUNFRC  --  <0.10*  --   < > 0.18* 0.35 0.42  CREATININE  --  2.43*  --   --  1.64*  --  1.23*  TROPONINI <0.30 <0.30 <0.30  --   --   --   --   < > = values in this interval not displayed.  Estimated Creatinine Clearance: 47.5 ml/min (by C-G formula based on Cr of 1.23).   Assessment: Weight gain and pulmonary edema. [age] YOF with Afib on Coumadin PTA. INR 1.22 on 1.25mg /2.5mg  alternating.  PMH: CHF, afib, HTN, gout, obesity, CKD  AC: Afib. Heparin level 0.42. INR today 1.66. CHADSVASC=5. MD would like to change to Lovenox  ID: no Abx, Afeb, WBC 14.7>>10.2  CV: dCHF, HTN, VSS. Meds: ASA81, Lipitor10, Diltiazem 120, Flecainide, po Lasix, Zaroxolyn, K+ (K=2.7 today)  Endo: 86, Gout  GI/Nutr: obesity, AST/ALT inc likely passive congestion  Renal: CKD: SrC 2.43>>1.64>>1.23. K low at 2.7. Mg=2.2  Pulm: 02 dependant at home, Atrovent nasal, Claritin for allergic rhinitis. 92%. Weaned from CPAP>>[place]  Heme/Onc: Anemia, Hg 11.4, PTLC 381  Other: allopurinol, colchicine, oxycontin, .  Goal of Therapy:  Heparin level 0.3-0.7 units/ml INR 2-3 Monitor platelets by anticoagulation  protocol: Yes   Plan:  D/c IV heparin. After 1 hrs, start Lovenox 120mg  SQ BID Coumadin 4mg  po x 1 tonight K+ recheck this afternoon    [name] [name] S. [name] [name], PharmD, [facility] GroupBCPS Clinical Staff Pharmacist Pager [phone]  [facility]andCrystal Stillinger [name] [name] 09/03/2013,8:25 AM

## 2013-09-04 DIAGNOSIS — E876 Hypokalemia: Secondary | ICD-10-CM

## 2013-09-04 DIAGNOSIS — N183 Chronic kidney disease, stage 3 unspecified: Secondary | ICD-10-CM

## 2013-09-04 LAB — BASIC METABOLIC PANEL
BUN: 49 mg/dL — ABNORMAL HIGH (ref 6–23)
CALCIUM: 9.9 mg/dL (ref 8.4–10.5)
CO2: 32 meq/L (ref 19–32)
CREATININE: 1.29 mg/dL — AB (ref 0.50–1.10)
Chloride: 94 mEq/L — ABNORMAL LOW (ref 96–112)
GFR calc Af Amer: 44 mL/min — ABNORMAL LOW (ref >90)
GFR calc non Af Amer: 38 mL/min — ABNORMAL LOW (ref >90)
Glucose, Bld: 96 mg/dL (ref 70–99)
Potassium: 3.4 mEq/L — ABNORMAL LOW (ref 3.7–5.3)
SODIUM: 143 meq/L (ref 137–147)

## 2013-09-04 LAB — CBC
HCT: 38.8 % (ref 36.0–46.0)
Hemoglobin: 11.4 g/dL — ABNORMAL LOW (ref 12.0–15.0)
MCH: 27.2 pg (ref 26.0–34.0)
MCHC: 29.4 g/dL — ABNORMAL LOW (ref 30.0–36.0)
MCV: 92.6 fL (ref 78.0–100.0)
PLATELETS: 350 10*3/uL (ref 150–400)
RBC: 4.19 MIL/uL (ref 3.87–5.11)
RDW: 21.3 % — ABNORMAL HIGH (ref 11.5–15.5)
WBC: 8.9 10*3/uL (ref 4.0–10.5)

## 2013-09-04 LAB — PROTIME-INR
INR: 1.72 — ABNORMAL HIGH (ref 0.00–1.49)
Prothrombin Time: 19.7 seconds — ABNORMAL HIGH (ref 11.6–15.2)

## 2013-09-04 MED ORDER — POTASSIUM CHLORIDE CRYS ER 20 MEQ PO TBCR: 60.0000 meq | EXTENDED_RELEASE_TABLET | Freq: Every day | ORAL | Status: DC

## 2013-09-04 MED ORDER — WARFARIN SODIUM 5 MG PO TABS
5.0000 mg | ORAL_TABLET | Freq: Once | ORAL | Status: AC
  Administered 2013-09-04: 5 mg via ORAL
  Filled 2013-09-04: qty 1

## 2013-09-04 MED ORDER — POTASSIUM CHLORIDE CRYS ER 20 MEQ PO TBCR
40.0000 meq | EXTENDED_RELEASE_TABLET | Freq: Two times a day (BID) | ORAL | Status: DC
  Administered 2013-09-04 (×2): 40 meq via ORAL
  Filled 2013-09-04 (×3): qty 2

## 2013-09-04 MED ORDER — FUROSEMIDE 10 MG/ML IJ SOLN
40.0000 mg | Freq: Two times a day (BID) | INTRAMUSCULAR | Status: DC
  Administered 2013-09-04 – 2013-09-05 (×4): 40 mg via INTRAVENOUS
  Filled 2013-09-04 (×5): qty 4

## 2013-09-04 NOTE — Progress Notes (Signed)
ANTICOAGULATION CONSULT NOTE - Follow Up Consult  Pharmacy Consult for Coumadin/Lovenox Indication: atrial fibrillation  Allergies  Allergen Reactions  . Prednisone     Eyes swelling    Patient Measurements: Height: 5\' 4"  (162.6 cm) Weight: 267 lb 4.8 oz (121.246 kg) IBW/kg (Calculated) : 54.7 Heparin Dosing Weight: 85.5 kg  Vital Signs: Temp: 97.6 F (36.4 C) (04/25 0442) Temp src: Oral (04/25 0442) BP: 130/56 mmHg (04/25 0442) Pulse Rate: 67 (04/25 0442)  Labs:  Recent Labs  09/01/13 0947  09/02/13 0116 09/02/13 1010 09/03/13 0522 09/04/13 0550  HGB  --   < > 10.4*  --  11.4* 11.4*  HCT  --   --  33.8*  --  37.8 38.8  PLT  --   --  331  --  381 350  LABPROT  --   --  15.6*  --  19.1* 19.7*  INR  --   --  1.27  --  1.66* 1.72*  HEPARINUNFRC  --   < > 0.18* 0.35 0.42  --   CREATININE  --   --  1.64*  --  1.23*  --   TROPONINI <0.30  --   --   --   --   --   < > = values in this interval not displayed.  Estimated Creatinine Clearance: 47.6 ml/min (by C-G formula based on Cr of 1.23).  Assessment: [age] YOF who continues on Lovenox bridge (switched from heparin to Lovenox on 4/24) to therapeutic INR with warfarin for hx Afib. INR today is subtherapeutic at 1.72. CBC stable. No s/s of bleeding noted.   Goal of Therapy:  INR 2-3 Monitor platelets by anticoagulation protocol: Yes  Plan:  - Continue Lovenox 120mg  sq BID - Coumadin 5mg  x 1 tonight - Monitor CBC, INR, and s/s of bleeding  [name] [name], PharmD Clinical Pharmacist - Resident Pager: [phone] Pharmacy: [phone] 09/04/2013 7:50 AM

## 2013-09-04 NOTE — Progress Notes (Signed)
Physical Therapy Treatment Patient Details Name: [name] [name] MRN: [id number] DOB: 03/01/1932 Today's Date: 09/04/2013    History of Present Illness With a hx of chronic diastolic heart failure, afib on coumadin, htn, gout, obesity, and stage 3 ckd who initially presented to her PCP one week prior, found to have low K. Pt was instructed to hold diuretics temporarily. Pt then noted increased sob and decreased urine output with 6 pound wt gain over the past 2 days. She presented to clinic on day of admit, found to be mildly hypotensive and was referred to the Ed.    PT Comments    Pt progressing mobility. Continues to be limited due to fatigue and SOB. Pt very motivated. Plans to return home with 24/7 (A) upon acute D/C. Will cont to follow per POC.   Follow Up Recommendations  Home health PT;Supervision/Assistance - 24 hour     Equipment Recommendations  None recommended by PT    Recommendations for Other Services       Precautions / Restrictions Precautions Precautions: Fall Precaution Comments: reports she had falls many years ago; denies any recent falls ; 3L O2  Restrictions Weight Bearing Restrictions: No    Mobility  Bed Mobility Overal bed mobility: Modified Independent             General bed mobility comments: use of handrail and incr time to perform bed mobility   Transfers Overall transfer level: Needs assistance Equipment used: Rolling walker (2 wheeled) Transfers: Sit to/from [employer]GI CorporationStand;Stand Pivot Transfers Sit to Stand: Supervision Stand pivot transfers: Supervision       General transfer comment: supervision for safety for SPT to BSC; min cues for hand placement and upright posture   Ambulation/Gait Ambulation/Gait assistance: Supervision Ambulation Distance (Feet): 90 Feet Assistive device: Rolling walker (2 wheeled) Gait Pattern/deviations: Step-through pattern;Wide base of support (waddle gt) Gait velocity: decreased  Gait velocity  interpretation: Below normal speed for age/gender General Gait Details: pt SOB and fatigued with ambulation; required standing rest break; cues for deep breathing    Stairs            Wheelchair Mobility    Modified Rankin (Stroke Patients Only)       Balance Overall balance assessment: Needs assistance Sitting-balance support: Feet supported;No upper extremity supported Sitting balance-Leahy Scale: Fair Sitting balance - Comments: sitting EOB ~3 min    Standing balance support: During functional activity;Bilateral upper extremity supported;No upper extremity supported Standing balance-Leahy Scale: Fair Standing balance comment: progressed to standing without UE support                    Cognition Arousal/Alertness: Awake/alert Behavior During Therapy: WFL for tasks assessed/performed Overall Cognitive Status: Within Functional Limits for tasks assessed                      Exercises General Exercises - Lower Extremity Ankle Circles/Pumps: AROM;Both;10 reps;Seated    General Comments        Pertinent Vitals/Pain Denies any pain     Home Living                      Prior Function            PT Goals (current goals can now be found in the care plan section) Acute Rehab PT Goals Patient Stated Goal: go home soon PT Goal Formulation: With patient Time For Goal Achievement: 09/10/13 Potential to Achieve Goals: Good Progress towards PT  goals: Progressing toward goals    Frequency  Min 3X/week    PT Plan Current plan remains appropriate    Co-evaluation             End of Session Equipment Utilized During Treatment: Gait belt;Oxygen Activity Tolerance: Patient tolerated treatment well Patient left: in chair;with call bell/phone within reach;with family/visitor present     Time: [id number] PT Time Calculation (min): 18 min  Charges:  $Gait Training: 8-22 mins                    G Codes:      LeamingtonBrittany N Kaliann Coryell,  [place] CarolinaPT [phone] 09/04/2013, 11:14 AM

## 2013-09-04 NOTE — Progress Notes (Signed)
[facility] PROGRESS NOTE  Aneta Minseggy T Deoliveira WUJ:[id number] DOB: 04/06/1932 DOA: 08/31/2013 PCP: [name], MD  Assessment/Plan  Acute respiratory failure with hypoxia secondary to acute on chronic diastolic heart failure.  Telemetry demonstrated normal sinus rhythm.  Gained a pound and feels more SOB today.  Still on 4L [place].  Diurese until blood pressure drops some or evidence of near dehydration on labs.   - Okay to restart IV lasix 40mg  BID  - ECHO Feb 2015: normal  - suspect pulmonary sx's from renal failure related volume overload  - pt with low GFR and requires Lasix to maintain adequate UOP  - has weaned from CPAP to cannula  -  Wt down 14 lbs over 3 days -  -2.5L yesterday  Acute renal failure on CKD 4, creatinine improved with diuresis suggesting cardiorenal syndrome -needs to establish with nephrologist for OP management of chronic renal failure  -Baseline Cr of 1.6, but currently 1.23 -watch overcorrection of BP  - repeat creatinine in AM  Hypokalemia, recurred due to diuresis, decreased despite increasing potassium yesterday -  Start potassium chloride 40meq BID -  Daily electrolytes -  Repeat magnesium level in AM  Transaminitis  -TB normal so likely due to passive congestion especially since trend is down since admit - repeat in 2 days  Anemia CKD  -hgb 10 - consider eval for Epogen  -cont Iron PO   Afib, rate controlled, off telemetry - Cont cardizem as tolerated - BP soft  - On coumadin - sub therapeutic so cont heparin bridge   HTN, BP stable - Cont home meds   Allergic rhinitis -  Start claritin and atrovent and resume allegra at discharge  Gout flare, stable.  Obesity, Stable  Leukocytosis, resolving with diuresis, no evidence of underlying infection -  Chest x-ray negative for pneumonia -  Urinalysis negative  Chronic wound right shin after hematoma last year -  Appreciate Wound care assistance  Diet:  Healthy heart Access:  PIV IVF:   off Proph:  Heparin gtt until INR therapeutic  Code Status: Full code Family Communication: Patient alone Disposition Plan: Pending correction of hypokalemia and further diuresis.  Still wheezing on exam and persistent hypoxia.  Home in several more days.    Consultants:  None  Procedures:  Chest x-ray  Antibiotics:  None   HPI/Subjective:  States her breathing is worse today.  Still at 4L [place] today.  Uses 2-3L [place] at home.  Burning in arm from IV potassium.  Pooping okay.     Objective: Filed Vitals:   09/03/13 2030 09/04/13 0001 09/04/13 0442 09/04/13 0451  BP: 125/67  130/56   Pulse: 80 88 67   Temp: 97.8 F (36.6 C)  97.6 F (36.4 C)   TempSrc: Oral  Oral   Resp: 18 20 18    Height:      Weight:    121.246 kg (267 lb 4.8 oz)  SpO2: 94%  92%     Intake/Output Summary (Last 24 hours) at 09/04/13 0650 Last data filed at 09/04/13 0451  Gross per 24 hour  Intake   1080 ml  Output   1201 ml  Net   -121 ml   Filed Weights   09/02/13 0637 09/03/13 0434 09/04/13 0451  Weight: 122.653 kg (270 lb 6.4 oz) 121.02 kg (266 lb 12.8 oz) 121.246 kg (267 lb 4.8 oz)    Exam:   General:  Obese CF, No acute distress  HEENT:  NCAT, MMM  Cardiovascular:  Distant heart sounds, RR, nl S1, S2 no mrg, 2+ pulses, warm extremities  Respiratory:  Rales improved, still faint rales at bases, faint wheezing bilaterally, no rhonchi, no increased WOB  Abdomen:   NABS, soft, NT/ND  MSK:   Normal tone and bulk, no LEE, right lower extremity with dressing >> chronic healing ulcer over last year  Neuro:  Grossly intact  Data Reviewed: Basic Metabolic Panel:  Recent Labs Lab 08/31/13 1631 09/01/13 0325 09/02/13 0116 09/02/13 1010 09/03/13 0522 09/03/13 1800  NA 133* 137 138  --  142  --   K 4.9 4.1 3.1*  --  2.7* 3.7  CL 88* 91* 88*  --  89*  --   CO2 23 26 31   --  37*  --   GLUCOSE 115* 147* 118*  --  86  --   BUN 50* 56* 66*  --  54*  --   CREATININE 2.84* 2.43* 1.64*   --  1.23*  --   CALCIUM 9.3 9.2 9.4  --  10.0  --   MG  --   --   --  2.2  --   --    Liver Function Tests:  Recent Labs Lab 08/31/13 1631 09/01/13 0325 09/02/13 0116  AST 372* 218* 104*  ALT 278* 246* 188*  ALKPHOS 107 111 102  BILITOT 0.3 0.2* 0.2*  PROT 7.8 7.6 7.4  ALBUMIN 2.9* 2.8* 2.9*   No results found for this basename: LIPASE, AMYLASE,  in the last 168 hours No results found for this basename: AMMONIA,  in the last 168 hours CBC:  Recent Labs Lab 08/31/13 1631 09/01/13 0325 09/02/13 0116 09/03/13 0522  WBC 16.9* 14.7* 14.7* 10.2  HGB 10.8* 10.4* 10.4* 11.4*  HCT 35.5* 33.4* 33.8* 37.8  MCV 91.3 89.8 90.4 92.0  PLT 338 322 331 381   Cardiac Enzymes:  Recent Labs Lab 08/31/13 2205 09/01/13 0325 09/01/13 0947  TROPONINI <0.30 <0.30 <0.30   BNP (last 3 results)  Recent Labs  08/31/13 1631  PROBNP 16475.0*   CBG:  Recent Labs Lab 08/31/13 2101  GLUCAP 145*    Recent Results (from the past 240 hour(s))  MRSA PCR SCREENING     Status: None   Collection Time    08/31/13  8:32 PM      Result Value Ref Range Status   MRSA by PCR NEGATIVE  NEGATIVE Final   Comment:            The GeneXpert MRSA Assay (FDA     approved for NASAL specimens     only), is one component of a     comprehensive MRSA colonization     surveillance program. It is not     intended to diagnose MRSA     infection nor to guide or     monitor treatment for     MRSA infections.     Studies: No results found.  Scheduled Meds: . aspirin  81 mg Oral Daily  . atorvastatin  10 mg Oral q1800  . diltiazem  120 mg Oral Daily  . enoxaparin (LOVENOX) injection  120 mg Subcutaneous Q12H  . escitalopram  10 mg Oral Daily  . ferrous sulfate  325 mg Oral Q breakfast  . flecainide  100 mg Oral Q12H  . furosemide  40 mg Oral BID  . ipratropium  2 spray Each Nare BID  . loratadine  10 mg Oral Daily  . potassium chloride  40 mEq Oral Daily  .  Warfarin - Pharmacist Dosing  Inpatient   Does not apply q1800   Continuous Infusions:    Active Problems:   OBESITY-MORBID (>100')   HYPERTENSION   Atrial fibrillation   DIASTOLIC HEART FAILURE, CHRONIC   Gout   CKD (chronic kidney disease) stage 3, GFR 30-59 ml/min   Syncope and collapse   Acute exacerbation of CHF (congestive heart failure)   CHF exacerbation    Time spent: 30 min    Renae FickleMackenzie Makeyla Govan  [facility] Pager [phone]. If 7PM-7AM, please contact night-coverage at [email or web address], password [email or web address] 09/04/2013, 6:50 AM  LOS: 4 days

## 2013-09-04 NOTE — Plan of Care (Signed)
Problem: Phase II Progression Outcomes Goal: Dyspnea controlled with activity Outcome: Adequate for Discharge Oxygen saturation 96%

## 2013-09-05 DIAGNOSIS — R079 Chest pain, unspecified: Secondary | ICD-10-CM

## 2013-09-05 LAB — BASIC METABOLIC PANEL
BUN: 46 mg/dL — AB (ref 6–23)
CO2: 30 meq/L (ref 19–32)
Calcium: 9.6 mg/dL (ref 8.4–10.5)
Chloride: 93 mEq/L — ABNORMAL LOW (ref 96–112)
Creatinine, Ser: 1.39 mg/dL — ABNORMAL HIGH (ref 0.50–1.10)
GFR calc Af Amer: 41 mL/min — ABNORMAL LOW (ref >90)
GFR, EST NON AFRICAN AMERICAN: 35 mL/min — AB (ref >90)
Glucose, Bld: 95 mg/dL (ref 70–99)
POTASSIUM: 3.3 meq/L — AB (ref 3.7–5.3)
Sodium: 141 mEq/L (ref 137–147)

## 2013-09-05 LAB — TROPONIN I
Troponin I: <0.3 ng/mL (ref <0.30)
Troponin I: <0.3 ng/mL (ref <0.30)

## 2013-09-05 LAB — PROTIME-INR
INR: 1.82 — ABNORMAL HIGH (ref 0.00–1.49)
Prothrombin Time: 20.5 seconds — ABNORMAL HIGH (ref 11.6–15.2)

## 2013-09-05 LAB — MAGNESIUM: Magnesium: 1.9 mg/dL (ref 1.5–2.5)

## 2013-09-05 MED ORDER — MAGNESIUM SULFATE IN D5W 10-5 MG/ML-% IV SOLN
1.0000 g | Freq: Once | INTRAVENOUS | Status: AC
  Administered 2013-09-05: 1 g via INTRAVENOUS
  Filled 2013-09-05: qty 100

## 2013-09-05 MED ORDER — POTASSIUM CHLORIDE CRYS ER 20 MEQ PO TBCR
40.0000 meq | EXTENDED_RELEASE_TABLET | Freq: Three times a day (TID) | ORAL | Status: DC
  Administered 2013-09-05 – 2013-09-06 (×5): 40 meq via ORAL
  Filled 2013-09-05 (×5): qty 2

## 2013-09-05 MED ORDER — WARFARIN SODIUM 6 MG PO TABS
6.0000 mg | ORAL_TABLET | Freq: Once | ORAL | Status: AC
  Administered 2013-09-05: 6 mg via ORAL
  Filled 2013-09-05: qty 1

## 2013-09-05 NOTE — Progress Notes (Signed)
Patient resting quietly with no complaints. 

## 2013-09-05 NOTE — Progress Notes (Signed)
ANTICOAGULATION CONSULT NOTE - Follow Up Consult  Pharmacy Consult for Coumadin/Lovenox Indication: atrial fibrillation  Allergies  Allergen Reactions  . Prednisone     Eyes swelling    Patient Measurements: Height: 5\' 4"  (162.6 cm) Weight: 267 lb 3.2 oz (121.2 kg) IBW/kg (Calculated) : 54.7 Heparin Dosing Weight: 85.5 kg  Vital Signs: Temp: 97.8 F (36.6 C) (04/26 0446) Temp src: Oral (04/26 0446) BP: 128/58 mmHg (04/26 0446) Pulse Rate: 71 (04/26 0446)  Labs:  Recent Labs  09/02/13 1010 09/03/13 0522 09/04/13 0550 09/05/13 0402  HGB  --  11.4* 11.4*  --   HCT  --  37.8 38.8  --   PLT  --  381 350  --   LABPROT  --  19.1* 19.7* 20.5*  INR  --  1.66* 1.72* 1.82*  HEPARINUNFRC 0.35 0.42  --   --   CREATININE  --  1.23* 1.29* 1.39*    Estimated Creatinine Clearance: 42.1 ml/min (by C-G formula based on Cr of 1.39).  Assessment: [age] YOF who continues on Lovenox bridge (switched from heparin to Lovenox on 4/24) to therapeutic INR with warfarin for hx Afib. INR today is subtherapeutic at 1.82. CBC stable. No s/s of bleeding noted.   Goal of Therapy:  INR 2-3 Monitor platelets by anticoagulation protocol: Yes  Plan:  - Continue Lovenox 120mg  sq BID - Coumadin 6mg  x 1 tonight - Monitor CBC, INR, and s/s of bleeding  Prudence Heiny A. Lenon AhmadiBinz, PharmD Clinical Pharmacist - Resident Pager: [phone] Pharmacy: [phone] 09/05/2013 7:57 AM

## 2013-09-05 NOTE — Plan of Care (Signed)
Problem: Phase II Progression Outcomes Goal: Walk in hall or up in chair TID Outcome: Progressing Walked in hallway by PT 09/04/2013.  Ambulates in room with moderate assist.

## 2013-09-05 NOTE — Progress Notes (Addendum)
[facility] PROGRESS NOTE  [name] [name]:[id number] DOB: August 08, 1931 DOA: 08/31/2013 PCP: Kerby Nora, MD  Assessment/Plan  Episode of substernal chest pain with SOB overnight.  Denies associted nausea or diaphoresis.  lasted several minutes and was severe, and resolved on own.  Ddx includes muscular pain, tightness from heart failure, ACS -  Continue daily aspirin -  ECG:  Stable from prior ECG 06/2013.  Slightly flattened T-waves in V4-V6 compared to ECG from admission -  Telemetry -  Cycle troponins  Acute respiratory failure with hypoxia secondary to acute on chronic diastolic heart failure.  Telemetry demonstrated normal sinus rhythm. Down to baseline 2L [place] and breathing is better.  Creatinine starting to rise a little bit today. - continue IV lasix 40mg  BID  - ECHO Feb 2015: normal  - suspect pulmonary sx's from renal failure related volume overload  - pt with low GFR and requires Lasix to maintain adequate UOP  - has weaned from CPAP to cannula  -  -2.3L yesterday -  Wt stable, but did not have BM day prior  Acute renal failure on CKD 4, creatinine improved with diuresis suggesting cardiorenal syndrome -needs to establish with nephrologist for OP management of chronic renal failure  -Baseline Cr of 1.6, creatinine better than baseline currently - repeat creatinine in AM  Hypokalemia, recurred due to diuresis, decreased despite increasing potassium yesterday -  Increase potassium chloride to TID -  Daily electrolytes -  Magnesium 1.9 >> give 1g magnesium  Transaminitis  -TB normal so likely due to passive congestion especially since trend is down since admit - repeat tomorrow  Anemia CKD  -hgb 10 - consider eval for Epogen  -cont Iron PO   Afib, rate controlled, off telemetry - Cont cardizem as tolerated - BP soft  - On coumadin - sub therapeutic so cont heparin bridge   HTN, BP stable - Cont home meds   Allergic rhinitis -  Start claritin and  atrovent and resume allegra at discharge  Gout flare, stable.  Obesity, Stable  Leukocytosis, resolving with diuresis, no evidence of underlying infection -  Chest x-ray negative for pneumonia -  Urinalysis negative  Chronic wound right shin after hematoma last year -  Appreciate Wound care assistance  Diet:  Healthy heart Access:  PIV IVF:  off Proph:  Heparin gtt until INR therapeutic  Code Status: Full code Family Communication: Patient alone Disposition Plan: Likely home tomorrow    Consultants:  None  Procedures:  Chest x-ray  Antibiotics:  None   HPI/Subjective:  States her breathing is better today. She is feeling thirsty her today. Had episode of chest pain last night with associated shortness of breath.  Objective: Filed Vitals:   09/04/13 1300 09/04/13 2055 09/04/13 2357 09/05/13 0446  BP: 127/62 103/69  128/58  Pulse: 86 83 86 71  Temp: 98.1 F (36.7 C) 98 F (36.7 C)  97.8 F (36.6 C)  TempSrc: Oral Oral  Oral  Resp: 18 18 18 18   Height:      Weight:    121.2 kg (267 lb 3.2 oz)  SpO2: 95% 96%  93%    Intake/Output Summary (Last 24 hours) at 09/05/13 0650 Last data filed at 09/05/13 0448  Gross per 24 hour  Intake    960 ml  Output   3300 ml  Net  -2340 ml   Filed Weights   09/03/13 0434 09/04/13 0451 09/05/13 0446  Weight: 121.02 kg (266 lb 12.8 oz) 121.246 kg (  267 lb 4.8 oz) 121.2 kg (267 lb 3.2 oz)    Exam:   General:  Obese CF, No acute distress  HEENT:  NCAT, MMM  Cardiovascular:  Distant heart sounds, RR, nl S1, S2 no mrg, 2+ pulses, warm extremities  Respiratory:  Clear to auscultation bilaterally, no increased WOB  Abdomen:   NABS, soft, NT/ND  MSK:   Normal tone and bulk, no LEE, right lower extremity with dressing >> chronic healing ulcer over last year  Neuro:  Grossly intact  Data Reviewed: Basic Metabolic Panel:  Recent Labs Lab 09/01/13 0325 09/02/13 0116 09/02/13 1010 09/03/13 0522 09/03/13 1800  09/04/13 0550 09/05/13 0402  NA 137 138  --  142  --  143 141  K 4.1 3.1*  --  2.7* 3.7 3.4* 3.3*  CL 91* 88*  --  89*  --  94* 93*  CO2 26 31  --  37*  --  32 30  GLUCOSE 147* 118*  --  86  --  96 95  BUN 56* 66*  --  54*  --  49* 46*  CREATININE 2.43* 1.64*  --  1.23*  --  1.29* 1.39*  CALCIUM 9.2 9.4  --  10.0  --  9.9 9.6  MG  --   --  2.2  --   --   --  1.9   Liver Function Tests:  Recent Labs Lab 08/31/13 1631 09/01/13 0325 09/02/13 0116  AST 372* 218* 104*  ALT 278* 246* 188*  ALKPHOS 107 111 102  BILITOT 0.3 0.2* 0.2*  PROT 7.8 7.6 7.4  ALBUMIN 2.9* 2.8* 2.9*   No results found for this basename: LIPASE, AMYLASE,  in the last 168 hours No results found for this basename: AMMONIA,  in the last 168 hours CBC:  Recent Labs Lab 08/31/13 1631 09/01/13 0325 09/02/13 0116 09/03/13 0522 09/04/13 0550  WBC 16.9* 14.7* 14.7* 10.2 8.9  HGB 10.8* 10.4* 10.4* 11.4* 11.4*  HCT 35.5* 33.4* 33.8* 37.8 38.8  MCV 91.3 89.8 90.4 92.0 92.6  PLT 338 322 331 381 350   Cardiac Enzymes:  Recent Labs Lab 08/31/13 2205 09/01/13 0325 09/01/13 0947  TROPONINI <0.30 <0.30 <0.30   BNP (last 3 results)  Recent Labs  08/31/13 1631  PROBNP 16475.0*   CBG:  Recent Labs Lab 08/31/13 2101  GLUCAP 145*    Recent Results (from the past 240 hour(s))  MRSA PCR SCREENING     Status: None   Collection Time    08/31/13  8:32 PM      Result Value Ref Range Status   MRSA by PCR NEGATIVE  NEGATIVE Final   Comment:            The GeneXpert MRSA Assay (FDA     approved for NASAL specimens     only), is one component of a     comprehensive MRSA colonization     surveillance program. It is not     intended to diagnose MRSA     infection nor to guide or     monitor treatment for     MRSA infections.     Studies: No results found.  Scheduled Meds: . aspirin  81 mg Oral Daily  . atorvastatin  10 mg Oral q1800  . diltiazem  120 mg Oral Daily  . enoxaparin (LOVENOX)  injection  120 mg Subcutaneous Q12H  . escitalopram  10 mg Oral Daily  . ferrous sulfate  325 mg Oral Q breakfast  .  flecainide  100 mg Oral Q12H  . furosemide  40 mg Intravenous BID  . ipratropium  2 spray Each Nare BID  . loratadine  10 mg Oral Daily  . potassium chloride  40 mEq Oral TID  . Warfarin - Pharmacist Dosing Inpatient   Does not apply q1800   Continuous Infusions:    Active Problems:   OBESITY-MORBID (>100')   HYPERTENSION   Atrial fibrillation   DIASTOLIC HEART FAILURE, CHRONIC   Gout   CKD (chronic kidney disease) stage 3, GFR 30-59 ml/min   Syncope and collapse   Acute exacerbation of CHF (congestive heart failure)   CHF exacerbation    Time spent: 30 min    Renae FickleMackenzie Danyah Guastella  [facility] Pager [phone]. If 7PM-7AM, please contact night-coverage at [email or web address], password [email or web address] 09/05/2013, 6:50 AM  LOS: 5 days

## 2013-09-05 NOTE — Progress Notes (Signed)
Patient rested quietly with no complaints.

## 2013-09-06 DIAGNOSIS — I10 Essential (primary) hypertension: Secondary | ICD-10-CM

## 2013-09-06 LAB — COMPREHENSIVE METABOLIC PANEL
ALT: 57 U/L — AB (ref 0–35)
AST: 30 U/L (ref 0–37)
Albumin: 3.1 g/dL — ABNORMAL LOW (ref 3.5–5.2)
Alkaline Phosphatase: 99 U/L (ref 39–117)
BUN: 37 mg/dL — AB (ref 6–23)
CALCIUM: 9.7 mg/dL (ref 8.4–10.5)
CO2: 27 meq/L (ref 19–32)
Chloride: 96 mEq/L (ref 96–112)
Creatinine, Ser: 1.26 mg/dL — ABNORMAL HIGH (ref 0.50–1.10)
GFR, EST AFRICAN AMERICAN: 46 mL/min — AB (ref >90)
GFR, EST NON AFRICAN AMERICAN: 39 mL/min — AB (ref >90)
GLUCOSE: 108 mg/dL — AB (ref 70–99)
Potassium: 3.9 mEq/L (ref 3.7–5.3)
Sodium: 139 mEq/L (ref 137–147)
Total Bilirubin: 0.3 mg/dL (ref 0.3–1.2)
Total Protein: 7.4 g/dL (ref 6.0–8.3)

## 2013-09-06 LAB — PROTIME-INR
INR: 1.97 — ABNORMAL HIGH (ref 0.00–1.49)
Prothrombin Time: 21.8 seconds — ABNORMAL HIGH (ref 11.6–15.2)

## 2013-09-06 MED ORDER — FUROSEMIDE 80 MG PO TABS
80.0000 mg | ORAL_TABLET | Freq: Two times a day (BID) | ORAL | Status: DC
  Administered 2013-09-06 (×2): 80 mg via ORAL
  Filled 2013-09-06 (×3): qty 1

## 2013-09-06 MED ORDER — METOLAZONE 2.5 MG PO TABS
2.5000 mg | ORAL_TABLET | Freq: Every day | ORAL | Status: DC
  Administered 2013-09-06: 2.5 mg via ORAL
  Filled 2013-09-06: qty 1

## 2013-09-06 MED ORDER — FUROSEMIDE 80 MG PO TABS: 80.0000 mg | ORAL_TABLET | Freq: Two times a day (BID) | ORAL | Status: DC

## 2013-09-06 MED ORDER — POTASSIUM CHLORIDE CRYS ER 20 MEQ PO TBCR: 40.0000 meq | EXTENDED_RELEASE_TABLET | Freq: Three times a day (TID) | ORAL | Status: DC

## 2013-09-06 NOTE — Progress Notes (Signed)
Patient evaluated for community based chronic disease management services with [facility] as a benefit of patient's [place] [employer] Insurance. Spoke with patient at bedside to explain [facility].  Services have been accepted and written consents received with authorized contact noted.  [facility] and has a private duty CNA that assists with ADLs.  Her son is her HCPOA [place] Acehil Rothschild [phone].  Patient will receive a post discharge transition of care call and will be evaluated for monthly home visits for assessments and disease process education.  Left contact information and [facility] literature at bedside. Made Inpatient Case Manager aware that [facility] following. Of note, [facility] services does not replace or interfere with any services that are arranged by inpatient case management or social work.  For additional questions or referrals please contact Anibal Hendersonim Henderson BSN RN [facility] Liaison at [phone].

## 2013-09-06 NOTE — Progress Notes (Signed)
[id number] Patient is A/Ox4 and is ambulatory with 1 person standby assist. She had no c/o pain and no signs of distress. Pt.had orders for discharge. Discharge paperwork was discussed. IV taken out. She was transported by wheelchair accompanied by NT and daughter in law.

## 2013-09-06 NOTE — Consult Note (Signed)
WOC wound follow up Wound type: recalcitrant skin tear  Pressure Ulcer POA:No  Larger wound still appears mostly reepithealized with some partial thickness skin loss at the wound edges. She has 3 open areas distal to this that are open, partial thickness, healing well.  Wound bed: all areas are partial thickness, pink, moist, pale  Drainage (amount, consistency, odor) minimal, serous  Periwound: intact, some superficial scabbing on the distal extremity.  Dressing procedure/placement/frequency:  Continue current POC with hydroferra blue, change M/Th. Lotion applied to dry skin of the leg today. Pt reports she is for discharge to home today, she will need her dressing changed again [facility].  Discussed POC with patient and bedside nurse.  Re consult if needed, will not follow at this time.  Thanks  Alexsis Branscom [employer] RN, CWOCN [phone])

## 2013-09-06 NOTE — Progress Notes (Signed)
Physical Therapy Treatment Patient Details Name: [name] [name] MRN: [id number] DOB: 07/30/1931 Today's Date: 09/06/2013    History of Present Illness With a hx of chronic diastolic heart failure, afib on coumadin, htn, gout, obesity, and stage 3 ckd who initially presented to her PCP one week prior, found to have low K. Pt was instructed to hold diuretics temporarily. Pt then noted increased sob and decreased urine output with 6 pound wt gain over the past 2 days. She presented to clinic on day of admit, found to be mildly hypotensive and was referred to the Ed.    PT Comments    Pt progressing well towards physical therapy goals. Was able to utilize pursed-lip breathing techniques to improve O2 saturation during mobility and ambulation. Pt states she is to d/c this afternoon. Discussed safety at home and HEP until HHPT begins.   Follow Up Recommendations  Home health PT;Supervision/Assistance - 24 hour     Equipment Recommendations  None recommended by PT    Recommendations for Other Services       Precautions / Restrictions Precautions Precautions: Fall Restrictions Weight Bearing Restrictions: No    Mobility  Bed Mobility Overal bed mobility: Modified Independent             General bed mobility comments: use of handrail and incr time to perform bed mobility   Transfers Overall transfer level: Needs assistance Equipment used: Rolling walker (2 wheeled) Transfers: Sit to/from Stand Sit to Stand: Modified independent (Device/Increase time)         General transfer comment: Pt demonstrated proper hand placement and safety awareness with transfers sit<>stand. No physical assist required.   Ambulation/Gait Ambulation/Gait assistance: Supervision Ambulation Distance (Feet): 150 Feet Assistive device: Rolling walker (2 wheeled) Gait Pattern/deviations: Step-through pattern;Decreased stride length;Wide base of support;Trunk flexed Gait velocity: decreased  Gait  velocity interpretation: Below normal speed for age/gender General Gait Details: VC's for pursed-lip breathing. Pt on 3L/min supplemental O2 throughout gait training. No unsteadiness or LOB noted.   Stairs            Wheelchair Mobility    Modified Rankin (Stroke Patients Only)       Balance Overall balance assessment: Needs assistance Sitting-balance support: Feet supported;No upper extremity supported Sitting balance-Leahy Scale: Fair     Standing balance support: Bilateral upper extremity supported Standing balance-Leahy Scale: Fair Standing balance comment: Brief moments where pt not holding onto walker. Can maintain balance however do not feel she could tolerate perturbations.                     Cognition Arousal/Alertness: Awake/alert Behavior During Therapy: WFL for tasks assessed/performed Overall Cognitive Status: Within Functional Limits for tasks assessed                      Exercises General Exercises - Lower Extremity Long Arc Quad: 15 reps Hip Flexion/Marching: 15 reps;Seated    General Comments        Pertinent Vitals/Pain Pt on 3L/min supplemental O2 throughout sesson. At 90% at rest, decreased to 87% with ambulation, and able to be improved to 93% with seated rest break after gait training.     Home Living                      Prior Function            PT Goals (current goals can now be found in the care plan section) Acute  Rehab PT Goals Patient Stated Goal: Home today PT Goal Formulation: With patient Time For Goal Achievement: 09/10/13 Potential to Achieve Goals: Good Progress towards PT goals: Progressing toward goals    Frequency  Min 3X/week    PT Plan Current plan remains appropriate    Co-evaluation             End of Session Equipment Utilized During Treatment: Gait belt;Oxygen Activity Tolerance: Patient tolerated treatment well Patient left: in chair;with call bell/phone within  reach;with family/visitor present     Time: 1030-1055 PT Time Calculation (min): 25 min  Charges:  $Gait Training: 8-22 mins $Therapeutic Activity: 8-22 mins                    G Codes:      [name] CancerLaura [name] 09/06/2013, 12:58 PM  [name] CancerLaura [name], PT, DPT Acute Rehabilitation Services Pager: [phone]

## 2013-09-08 ENCOUNTER — Other Ambulatory Visit: Payer: Self-pay | Admitting: *Deleted

## 2013-09-08 MED ORDER — ESCITALOPRAM OXALATE 20 MG PO TABS: 20.0000 mg | ORAL_TABLET | Freq: Every day | ORAL | Status: DC

## 2013-09-08 NOTE — Telephone Encounter (Signed)
[facility] [facility] for refill on escitalopram 10 mg with note stating patient says she is now taking 20 mg.  Per office note on 08/20/2013,  if patient had no side effects on 10 mg, then she was instructed to increase to 20 mg.  Prescription for escitalopram 20 mg sent in to pharmacy.

## 2013-09-10 ENCOUNTER — Encounter: Payer: Self-pay | Admitting: Surgery

## 2013-09-10 ENCOUNTER — Encounter: Payer: Self-pay | Admitting: Cardiothoracic Surgery

## 2013-09-13 ENCOUNTER — Ambulatory Visit (INDEPENDENT_AMBULATORY_CARE_PROVIDER_SITE_OTHER): Payer: Medicare Other | Admitting: Family Medicine

## 2013-09-13 DIAGNOSIS — I749 Embolism and thrombosis of unspecified artery: Secondary | ICD-10-CM

## 2013-09-13 DIAGNOSIS — I4891 Unspecified atrial fibrillation: Secondary | ICD-10-CM

## 2013-09-13 DIAGNOSIS — Z5181 Encounter for therapeutic drug level monitoring: Secondary | ICD-10-CM

## 2013-09-13 LAB — POCT INR: INR: 2.3

## 2013-09-14 ENCOUNTER — Ambulatory Visit: Payer: Medicare Other | Admitting: Family Medicine

## 2013-09-17 ENCOUNTER — Ambulatory Visit (INDEPENDENT_AMBULATORY_CARE_PROVIDER_SITE_OTHER): Payer: Medicare Other | Admitting: Family Medicine

## 2013-09-17 ENCOUNTER — Encounter: Payer: Self-pay | Admitting: Family Medicine

## 2013-09-17 VITALS — BP 110/70 | HR 74 | Temp 98.1°F | Ht 64.0 in | Wt 270.5 lb

## 2013-09-17 DIAGNOSIS — M109 Gout, unspecified: Secondary | ICD-10-CM

## 2013-09-17 DIAGNOSIS — E876 Hypokalemia: Secondary | ICD-10-CM

## 2013-09-17 DIAGNOSIS — I10 Essential (primary) hypertension: Secondary | ICD-10-CM

## 2013-09-17 DIAGNOSIS — E538 Deficiency of other specified B group vitamins: Secondary | ICD-10-CM

## 2013-09-17 DIAGNOSIS — G609 Hereditary and idiopathic neuropathy, unspecified: Secondary | ICD-10-CM

## 2013-09-17 DIAGNOSIS — I509 Heart failure, unspecified: Secondary | ICD-10-CM

## 2013-09-17 DIAGNOSIS — G629 Polyneuropathy, unspecified: Secondary | ICD-10-CM | POA: Insufficient documentation

## 2013-09-17 MED ORDER — OXYCODONE HCL ER 10 MG PO T12A: 10.0000 mg | EXTENDED_RELEASE_TABLET | Freq: Two times a day (BID) | ORAL | Status: DC

## 2013-09-17 NOTE — Assessment & Plan Note (Signed)
Flare resolving.

## 2013-09-17 NOTE — Assessment & Plan Note (Signed)
Re-eval on higher dose lasix and higher dose potassium.

## 2013-09-17 NOTE — Assessment & Plan Note (Signed)
Eval thyroid (slightly elevated TSH in hospital) with t3 and t4. Eval B12.  no DM. ? If secondary to peripheral edemea history and nerve compression.

## 2013-09-17 NOTE — Assessment & Plan Note (Signed)
Resolved, now euvolemicx on lasix 80 BID and zarolxyln.

## 2013-09-17 NOTE — Progress Notes (Signed)
Subjective:    Patient ID: [name] [name], female    DOB: 09/17/1931, 80 y.o.   MRN: [id number]  HPI  80 year old female with comp;licated medical history presents for hospital follow up.  Admit date: 08/31/2013  Discharge date: 09/06/2013  Recommendations for Outpatient Follow-up:  1. Follow up with primary care doctor or cardiologist later this week. Weight check and repeat electrolytes at that time. Trend blood pressure. Repeat INR in 1 week.  2. Continue metolazone and increased lasix and potassium 3. Home health PT and RN  History of present illness:  [name] Minseggy T Hegeman is a 80 y.o. female  With a hx of chronic diastolic heart failure, afib on coumadin, htn, gout, obesity, and stage 3 ckd who initially presented to her PCP one week prior, found to have low K. Pt was instructed to hold diuretics temporarily. Pt then noted increased sob and decreased urine output with 6 pound wt gain over the past 2 days. She presented to clinic on day of admit, found to be mildly hypotensive and was referred to the Ed. In the Ed, pt was noted to have cxr findings worrisome for pulm edema. The patient was given 40mg  IV lasix and the hospitalist consulted for admission. Of note, the patient is normally home O2 dependent. In ED, required venturi mask at 56% FiO2.  Hospital Course:  Acute respiratory failure with hypoxia secondary to acute on chronic diastolic heart failure. She was started on IV Lasix. Initially she required CPAP however she was transitioned to nasal cannula. Since admission, she has lost 15 pounds. She is negative 12 liters overall. Telemetry demonstrated sinus bradycardia, troponin was negative. Echocardiogram from February of this year demonstrated preserved ejection fraction. Some of her hyperkalemia and pulmonary edema may also be secondary to progressive acute renal failure. She is likely diuretic dependent. Would not recommend stopping diuretics in the future. Her renal impairment was probably  cardiorenal syndrome as her kidney function improved with diuresis. She is advised to weigh herself daily and call her primary care doctor if she gains more than 3 pounds in one day or 5 pounds over 7 days. She should eat a low-sodium diet and take her Lasix every day. Her Lasix dose has been adjusted to 80 mg twice a day in addition to her metolazone.  Acute renal failure superimposed on chronic kidney disease stage IV. Her creatinine peaked at approximately 2.8, and trended down briskly with diuresis. Recommended to get established with a nephrologist for outpatient management of her chronic renal failure. Repeat BMP in approximately one week.   Hypokalemia, likely secondary to diuresis. She was started on daily potassium repletion. Her magnesium level was within normal limits. Her potassium dose has been increased to 40 mEq 3 times a day. Repeat BMP in one week.   Atypical chest pain, had an episode of substernal chest pain on 4/26 early in the morning. Telemetry demonstrated normal sinus rhythm. Troponins were again negative. Most likely this is musculoskeletal pain related to her cough.  Transaminitis was mild and likely due to passive hepatic congestion from fluid overload. These also trended down with diuresis. Repeat liver function tests as outpatient. If they remain elevated, consider further evaluation for hepatitis.   Anemia of chronic kidney disease, hemoglobin greater than 10 . Iron deficiency anemia, continue daily iron.  Atrial fibrillation, rate controlled. She continued Cardizem. She is on Coumadin however her INR was subtherapeutic so she was placed on a heparin bridge while in the hospital. [id number]  of 4. Her INR the day of discharge is 1.97.   Gout flare, right hand, improved.   Leukocytosis, improved with diuresis. Chest x-ray was negative for pneumonia. Urinalysis unremarkable.   Chronic wound of the right shin which is a complication of a hematoma from last year. Wound care  was consulted and did dressing change on 4/23. Continue biweekly dressing changes.  BP Readings from Last 3 Encounters:  09/17/13 110/70  09/06/13 125/65  08/31/13 94/48   Wt Readings from Last 3 Encounters:  09/17/13 270 lb 8 oz (122.698 kg)  09/06/13 267 lb 1.6 oz (121.156 kg)  08/24/13 277 lb 8 oz (125.873 kg)   Since being back from the hospital she reports she is back at baseline and weight has been stable. No chest pain. She did have some indigestion last night lying down to bed after eating late, burped a lot.  INR theraputic. She is now on lasix 80 mg twice daily (up from 40 mg daily) She is now on Kdur  ( 20 MEQ)2 tabs TID.   Bilateral feet numb constantly, not painful: ongpoing x several years, not any worse.   Review of Systems  Constitutional: Positive for fatigue. Negative for fever.  Respiratory: Negative for cough and wheezing.   Cardiovascular: Negative for chest pain, palpitations and leg swelling.  Gastrointestinal: Negative for abdominal pain.       Objective:   Physical Exam  Constitutional: Vital signs are normal. She appears well-developed and well-nourished. She is cooperative.  Non-toxic appearance. She does not appear ill. No distress.  obese  HENT:  Head: Normocephalic.  Right Ear: Hearing, tympanic membrane, external ear and ear canal normal. Tympanic membrane is not erythematous, not retracted and not bulging.  Left Ear: Hearing, tympanic membrane, external ear and ear canal normal. Tympanic membrane is not erythematous, not retracted and not bulging.  Nose: No mucosal edema or rhinorrhea. Right sinus exhibits no maxillary sinus tenderness and no frontal sinus tenderness. Left sinus exhibits no maxillary sinus tenderness and no frontal sinus tenderness.  Mouth/Throat: Uvula is midline, oropharynx is clear and moist and mucous membranes are normal.  Eyes: Conjunctivae, EOM and lids are normal. Pupils are equal, round, and reactive to light. Lids are  everted and swept, no foreign bodies found.  Neck: Trachea normal and normal range of motion. Neck supple. Carotid bruit is not present. No mass and no thyromegaly present.  Cardiovascular: Normal rate, regular rhythm, S1 normal, S2 normal, normal heart sounds, intact distal pulses and normal pulses.  Exam reveals no gallop and no friction rub.   No murmur heard. no edema, wrinkled skin on ankles  Pulmonary/Chest: Effort normal and breath sounds normal. Not tachypneic. No respiratory distress. She has no decreased breath sounds. She has no wheezes. She has no rhonchi. She has no rales.  Abdominal: Soft. Normal appearance and bowel sounds are normal. There is no tenderness.  Neurological: She is alert.  Skin: Skin is warm, dry and intact. No rash noted.  Right leg wound covered with bandage, underneath healing wound, with granulation tissue and bleeding with debriding dressing.  Psychiatric: Her speech is normal and behavior is normal. Judgment and thought content normal. Her mood appears not anxious. Cognition and memory are normal. She does not exhibit a depressed mood.          Assessment & Plan:

## 2013-09-17 NOTE — Assessment & Plan Note (Signed)
Well controlled. Continue current medication.  

## 2013-09-17 NOTE — Patient Instructions (Signed)
We will cal with lab results. Follow up in 3 months 30 min OV.

## 2013-09-17 NOTE — Progress Notes (Signed)
Pre visit review using our clinic review tool, if applicable. No additional management support is needed unless otherwise documented below in the visit note. 

## 2013-09-18 LAB — BASIC METABOLIC PANEL
BUN: 49 mg/dL — AB (ref 6–23)
CHLORIDE: 89 meq/L — AB (ref 96–112)
CO2: 32 mEq/L (ref 19–32)
Calcium: 9.7 mg/dL (ref 8.4–10.5)
Creat: 1.65 mg/dL — ABNORMAL HIGH (ref 0.50–1.10)
Glucose, Bld: 97 mg/dL (ref 70–99)
Potassium: 3.4 mEq/L — ABNORMAL LOW (ref 3.5–5.3)
Sodium: 138 mEq/L (ref 135–145)

## 2013-09-18 LAB — VITAMIN B12: Vitamin B-12: 635 pg/mL (ref 211–911)

## 2013-09-18 LAB — T3, FREE: T3, Free: 2.9 pg/mL (ref 2.3–4.2)

## 2013-09-18 LAB — T4, FREE: FREE T4: 1.29 ng/dL (ref 0.80–1.80)

## 2013-09-20 ENCOUNTER — Ambulatory Visit (INDEPENDENT_AMBULATORY_CARE_PROVIDER_SITE_OTHER): Payer: Medicare Other | Admitting: Family Medicine

## 2013-09-20 DIAGNOSIS — I749 Embolism and thrombosis of unspecified artery: Secondary | ICD-10-CM

## 2013-09-20 DIAGNOSIS — Z5181 Encounter for therapeutic drug level monitoring: Secondary | ICD-10-CM

## 2013-09-20 DIAGNOSIS — I4891 Unspecified atrial fibrillation: Secondary | ICD-10-CM

## 2013-09-20 LAB — POCT INR: INR: 1.3

## 2013-09-23 ENCOUNTER — Other Ambulatory Visit: Payer: Self-pay | Admitting: *Deleted

## 2013-09-23 MED ORDER — FERROUS SULFATE 325 (65 FE) MG PO TABS: 325.0000 mg | ORAL_TABLET | Freq: Every day | ORAL | Status: DC

## 2013-09-28 ENCOUNTER — Telehealth: Payer: Self-pay | Admitting: Radiology

## 2013-09-28 ENCOUNTER — Encounter: Payer: Self-pay | Admitting: Family Medicine

## 2013-09-28 ENCOUNTER — Ambulatory Visit (INDEPENDENT_AMBULATORY_CARE_PROVIDER_SITE_OTHER): Payer: Medicare Other | Admitting: Family Medicine

## 2013-09-28 VITALS — BP 120/76 | HR 70 | Temp 97.3°F | Ht 64.0 in | Wt 272.5 lb

## 2013-09-28 DIAGNOSIS — N189 Chronic kidney disease, unspecified: Secondary | ICD-10-CM

## 2013-09-28 DIAGNOSIS — E876 Hypokalemia: Secondary | ICD-10-CM

## 2013-09-28 DIAGNOSIS — N179 Acute kidney failure, unspecified: Secondary | ICD-10-CM

## 2013-09-28 LAB — BASIC METABOLIC PANEL
BUN: 51 mg/dL — AB (ref 6–23)
CO2: 35 mEq/L — ABNORMAL HIGH (ref 19–32)
Calcium: 9.8 mg/dL (ref 8.4–10.5)
Chloride: 88 mEq/L — ABNORMAL LOW (ref 96–112)
Creatinine, Ser: 1.8 mg/dL — ABNORMAL HIGH (ref 0.4–1.2)
GFR: 28.06 mL/min — ABNORMAL LOW (ref >60.00)
GLUCOSE: 91 mg/dL (ref 70–99)
Potassium: 2.7 mEq/L — CL (ref 3.5–5.1)
Sodium: 139 mEq/L (ref 135–145)

## 2013-09-28 NOTE — Patient Instructions (Signed)
Pt to have labs only

## 2013-09-28 NOTE — Telephone Encounter (Signed)
[facility] called a critical Potassium - 2.7, results given to Dr Ermalene SearingBedsole

## 2013-09-28 NOTE — Progress Notes (Signed)
Pre visit review using our clinic review tool, if applicable. No additional management support is needed unless otherwise documented below in the visit note. 

## 2013-09-28 NOTE — Progress Notes (Signed)
Pt to have labs only. appt scheduled incorrectly.

## 2013-09-30 ENCOUNTER — Ambulatory Visit (INDEPENDENT_AMBULATORY_CARE_PROVIDER_SITE_OTHER): Payer: Medicare Other | Admitting: Family Medicine

## 2013-09-30 DIAGNOSIS — I4891 Unspecified atrial fibrillation: Secondary | ICD-10-CM

## 2013-09-30 DIAGNOSIS — Z5181 Encounter for therapeutic drug level monitoring: Secondary | ICD-10-CM

## 2013-09-30 DIAGNOSIS — I749 Embolism and thrombosis of unspecified artery: Secondary | ICD-10-CM

## 2013-09-30 LAB — POCT INR: INR: 1.8

## 2013-10-08 ENCOUNTER — Other Ambulatory Visit: Payer: Self-pay | Admitting: *Deleted

## 2013-10-08 MED ORDER — POTASSIUM CHLORIDE CRYS ER 20 MEQ PO TBCR: 60.0000 meq | EXTENDED_RELEASE_TABLET | Freq: Three times a day (TID) | ORAL | Status: DC

## 2013-10-08 NOTE — Telephone Encounter (Signed)
Pt cking on status of K refill; advised sent to [facility]; pt said she just spoke with [place] at [facility] and she did not have refill; I called Id[facility] Needle and [facility] does have refill now; pt voiced understanding.

## 2013-10-11 ENCOUNTER — Other Ambulatory Visit: Payer: Self-pay

## 2013-10-11 ENCOUNTER — Encounter: Payer: Self-pay | Admitting: Cardiothoracic Surgery

## 2013-10-11 MED ORDER — DILTIAZEM HCL ER COATED BEADS 120 MG PO CP24: 120.0000 mg | ORAL_CAPSULE | Freq: Every day | ORAL | Status: DC

## 2013-10-11 NOTE — Telephone Encounter (Signed)
Refill sent for diltiazem 120 mg  

## 2013-10-14 ENCOUNTER — Ambulatory Visit (INDEPENDENT_AMBULATORY_CARE_PROVIDER_SITE_OTHER): Payer: Medicare Other | Admitting: Family Medicine

## 2013-10-14 DIAGNOSIS — I749 Embolism and thrombosis of unspecified artery: Secondary | ICD-10-CM

## 2013-10-14 DIAGNOSIS — I4891 Unspecified atrial fibrillation: Secondary | ICD-10-CM

## 2013-10-14 DIAGNOSIS — Z5181 Encounter for therapeutic drug level monitoring: Secondary | ICD-10-CM

## 2013-10-14 LAB — POCT INR: INR: 1.4

## 2013-10-26 ENCOUNTER — Other Ambulatory Visit (INDEPENDENT_AMBULATORY_CARE_PROVIDER_SITE_OTHER): Payer: Medicare Other

## 2013-10-26 DIAGNOSIS — E876 Hypokalemia: Secondary | ICD-10-CM

## 2013-10-26 LAB — POTASSIUM: Potassium: 3.2 mEq/L — ABNORMAL LOW (ref 3.5–5.1)

## 2013-11-01 ENCOUNTER — Ambulatory Visit (INDEPENDENT_AMBULATORY_CARE_PROVIDER_SITE_OTHER): Payer: Medicare Other | Admitting: Family Medicine

## 2013-11-01 DIAGNOSIS — Z5181 Encounter for therapeutic drug level monitoring: Secondary | ICD-10-CM

## 2013-11-01 DIAGNOSIS — I749 Embolism and thrombosis of unspecified artery: Secondary | ICD-10-CM

## 2013-11-01 LAB — POCT INR: INR: 2.8

## 2013-11-04 ENCOUNTER — Other Ambulatory Visit: Payer: Self-pay | Admitting: *Deleted

## 2013-11-04 MED ORDER — ESCITALOPRAM OXALATE 20 MG PO TABS: 20.0000 mg | ORAL_TABLET | Freq: Every day | ORAL | Status: DC

## 2013-11-10 ENCOUNTER — Encounter: Payer: Self-pay | Admitting: Cardiothoracic Surgery

## 2013-11-15 ENCOUNTER — Encounter: Payer: Self-pay | Admitting: Family Medicine

## 2013-11-19 ENCOUNTER — Other Ambulatory Visit: Payer: Self-pay | Admitting: *Deleted

## 2013-11-19 ENCOUNTER — Ambulatory Visit (INDEPENDENT_AMBULATORY_CARE_PROVIDER_SITE_OTHER): Payer: Medicare Other | Admitting: Family Medicine

## 2013-11-19 DIAGNOSIS — I749 Embolism and thrombosis of unspecified artery: Secondary | ICD-10-CM

## 2013-11-19 DIAGNOSIS — Z5181 Encounter for therapeutic drug level monitoring: Secondary | ICD-10-CM

## 2013-11-19 LAB — POCT INR: INR: 1.7

## 2013-11-19 MED ORDER — METOLAZONE 2.5 MG PO TABS: 2.5000 mg | ORAL_TABLET | Freq: Every day | ORAL | Status: DC

## 2013-11-19 NOTE — Telephone Encounter (Signed)
Requested Prescriptions   Signed Prescriptions Disp Refills  . metolazone (ZAROXOLYN) 2.5 MG tablet 30 tablet 3    Sig: Take 1 tablet (2.5 mg total) by mouth daily.    Authorizing Provider: Antonieta IbaGOLLAN, TIMOTHY J    Ordering User: Kendrick FriesLOPEZ, Falesha Schommer C

## 2013-11-24 ENCOUNTER — Other Ambulatory Visit: Payer: Self-pay

## 2013-11-24 NOTE — Telephone Encounter (Signed)
Pt request rx oxycodone. Call when ready for pick up. Pt has 2 days of med left but pt will have transportation 11/25/13 in the afternoon and wanted to know if could possibly pick up on 11/25/13 in afternoon. Please advise.

## 2013-11-25 ENCOUNTER — Ambulatory Visit (INDEPENDENT_AMBULATORY_CARE_PROVIDER_SITE_OTHER): Payer: Medicare Other | Admitting: Cardiovascular Disease

## 2013-11-25 ENCOUNTER — Encounter: Payer: Self-pay | Admitting: Cardiovascular Disease

## 2013-11-25 VITALS — BP 133/79 | HR 80 | Ht 64.0 in | Wt 263.0 lb

## 2013-11-25 DIAGNOSIS — J9611 Chronic respiratory failure with hypoxia: Secondary | ICD-10-CM

## 2013-11-25 DIAGNOSIS — R0602 Shortness of breath: Secondary | ICD-10-CM

## 2013-11-25 DIAGNOSIS — E876 Hypokalemia: Secondary | ICD-10-CM

## 2013-11-25 DIAGNOSIS — I10 Essential (primary) hypertension: Secondary | ICD-10-CM

## 2013-11-25 DIAGNOSIS — N179 Acute kidney failure, unspecified: Secondary | ICD-10-CM

## 2013-11-25 DIAGNOSIS — I480 Paroxysmal atrial fibrillation: Secondary | ICD-10-CM

## 2013-11-25 DIAGNOSIS — R0902 Hypoxemia: Secondary | ICD-10-CM

## 2013-11-25 DIAGNOSIS — J9610 Chronic respiratory failure, unspecified whether with hypoxia or hypercapnia: Secondary | ICD-10-CM

## 2013-11-25 DIAGNOSIS — I4891 Unspecified atrial fibrillation: Secondary | ICD-10-CM

## 2013-11-25 DIAGNOSIS — N189 Chronic kidney disease, unspecified: Secondary | ICD-10-CM

## 2013-11-25 DIAGNOSIS — E860 Dehydration: Secondary | ICD-10-CM

## 2013-11-25 DIAGNOSIS — I5033 Acute on chronic diastolic (congestive) heart failure: Secondary | ICD-10-CM

## 2013-11-25 MED ORDER — SPIRONOLACTONE 25 MG PO TABS: 25.0000 mg | ORAL_TABLET | Freq: Every day | ORAL | Status: DC

## 2013-11-25 MED ORDER — OXYCODONE HCL ER 10 MG PO T12A: 10.0000 mg | EXTENDED_RELEASE_TABLET | Freq: Two times a day (BID) | ORAL | Status: DC

## 2013-11-25 NOTE — Progress Notes (Signed)
Patient ID: [name] [name], female    DOB: Feb 13, 1932, 80 y.o.   MRN: [id number]  HPI Comments: 80 y/o woman with h/o morbid obesity, sleep apnea and obesity hypoventilation syndrome, atrial fib maintaining NSR on flecainide, DVT on the left and PE in 2007, diastolic HF on chronic diuretics, nonobstructive CAD by cath 8/10 and small AAA, chronic shortness of breath, edema.  She presents for routine followup  In followup today, she was recently in the hospital 08/31/2013. She reports that she had heart failure symptoms, had aggressive diuresis in the hospital with 15 pound weight drop. When she got home her weight was 270 pounds. Since then she has continued aggressive diuresis, now weight is 262 pounds. Over the past several months her creatinine has slowly climbed from her baseline 1.2, up to 1.6, 1.8 recently Recent potassium 3.2 She does report that she drinks a significant amount of fluid because her mouth is so dry.  Reports that the wound on her right lower extremity is healing well. She is adjusted and using eliquis when the visiting nurse stopped coming to her house as she is unable to make the trips to the clinic for INR checks   Previous episode of syncope/loss of consciousness in February 2014 on Valentine's Day. Creatinine at that time was 1.7 .She was given IV fluids. secondary to a huge hematoma of her right leg, she has had months of wound evaluation. Not a candidate for skin grafting. She has a leg wrap on the right. No swelling of her left lower extremity, only the right. Region of skin loss on the right is extensive. discharge 07/27/2012   hospitalization 07/14/2013 for epistaxis. INR was 3.7. She had her nose packed and cauterized. Cardiology evaluated the patient on 06/17/2013. Flecainide and diltiazem was continued. She was discharged on 06/18/2013 Lab work in the hospital 06/16/2013 showed creatinine 1.84, BUN 60, potassium 3.1  Echocardiogram in the hospital 06/27/2012 was  essentially normal, mildly dilated left atrium  EKG shows normal sinus rhythm with rate of 80 beats per minute, sinus arrhythmia, no significant ST or T wave changes   Outpatient Encounter Prescriptions as of 11/25/2013  Medication Sig  . allopurinol (ZYLOPRIM) 100 MG tablet Take 1 tablet (100 mg total) by mouth daily.  . Ascorbic Acid (VITAMIN C PO) Take 1 tablet by mouth daily.  [name] aspirin (ASPIR-81) 81 MG EC tablet Take 81 mg by mouth daily.    [name] atorvastatin (LIPITOR) 10 MG tablet Take 1 tablet (10 mg total) by mouth daily. Please schedule a physical, having fasting labs prior, for any additional refills prior.  . cetirizine (ZYRTEC ALLERGY) 10 MG tablet Take 10 mg by mouth daily.    . colchicine 0.6 MG tablet Take 0.6-1.2 mg by mouth daily as needed (for gout flare up).  . diltiazem (CARDIZEM CD) 120 MG 24 hr capsule Take 1 capsule (120 mg total) by mouth daily.  [name] escitalopram (LEXAPRO) 20 MG tablet Take 1 tablet (20 mg total) by mouth daily.  . ferrous sulfate 325 (65 FE) MG tablet Take 1 tablet (325 mg total) by mouth daily with breakfast.  . flecainide (TAMBOCOR) 100 MG tablet Take 1 tablet (100 mg total) by mouth every 12 (twelve) hours.  . furosemide (LASIX) 80 MG tablet Take 1 tablet (80 mg total) by mouth 2 (two) times daily.  [name] ipratropium (ATROVENT HFA) 17 MCG/ACT inhaler Inhale 2 puffs into the lungs 4 (four) times daily.  . metolazone (ZAROXOLYN) 2.5 MG tablet Take 1  tablet (2.5 mg total) by mouth daily.  . NON FORMULARY Oxygen 2.5 liters daily  . OxyCODONE (OXYCONTIN) 10 mg T12A 12 hr tablet Take 1 tablet (10 mg total) by mouth every 12 (twelve) hours.  . potassium chloride SA (K-DUR,KLOR-CON) 20 MEQ tablet Take 3 tablets (60 mEq total) by mouth 3 (three) times daily.  [name]. warfarin (COUMADIN) 2.5 MG tablet Take 1.25-2.5 mg by mouth See admin instructions. Alternate every other day with 0.5 -1 tablet    Review of Systems  Constitutional: Negative.   HENT: Negative.   Eyes:  Negative.   Respiratory: Negative.   Cardiovascular: Negative.   Gastrointestinal: Negative.   Endocrine: Negative.   Musculoskeletal: Positive for back pain.  Skin: Negative.   Allergic/Immunologic: Negative.   Neurological: Negative.   Hematological: Negative.   Psychiatric/Behavioral: Negative.   All other systems reviewed and are negative.   BP 133/79  Pulse 80  Ht 5\' 4"  (1.626 m)  Wt 263 lb (119.296 kg)  BMI 45.12 kg/m2  Physical Exam  Nursing note and vitals reviewed. Constitutional: She is oriented to person, place, and time. She appears well-developed and well-nourished.  Obese, on oxygen, right lower extremity with wrap in place  HENT:  Head: Normocephalic.  Nose: Nose normal.  Mouth/Throat: Oropharynx is clear and moist.  Eyes: Conjunctivae are normal. Pupils are equal, round, and reactive to light.  Neck: Normal range of motion. Neck supple. No JVD present.  Cardiovascular: Normal rate, regular rhythm, S1 normal, S2 normal, normal heart sounds and intact distal pulses.  Exam reveals no gallop and no friction rub.   No murmur heard. No significant edema  Pulmonary/Chest: Effort normal and breath sounds normal. No respiratory distress. She has no wheezes. She has no rales. She exhibits no tenderness.  Abdominal: Soft. Bowel sounds are normal. She exhibits no distension. There is no tenderness.  Musculoskeletal: Normal range of motion. She exhibits no edema and no tenderness.  Lymphadenopathy:    She has no cervical adenopathy.  Neurological: She is alert and oriented to person, place, and time. Coordination normal.  Skin: Skin is warm and dry. No rash noted. No erythema.  Leg wrap on the right lower extremity  Psychiatric: She has a normal mood and affect. Her behavior is normal. Judgment and thought content normal.    Assessment and Plan

## 2013-11-25 NOTE — Patient Instructions (Signed)
Kidney function suggests mild dehydration  Please hold the metolazone We will check a BMP today  Please start spironolactone  one a day  If your weight goes back above 270, Restart metolazone  Please call us if you have new issues that need to be addressed before your next appt.  Your physician wants you to follow-up in: 1 month.

## 2013-11-25 NOTE — Assessment & Plan Note (Signed)
Spironolactone 25 mg daily added. His could be titrated upwards as tolerated to help with potassium

## 2013-11-25 NOTE — Assessment & Plan Note (Signed)
Currently on oxygen. Breathing has not been any issue likely secondary to overdiuresis.

## 2013-11-25 NOTE — Assessment & Plan Note (Signed)
Blood pressure is well controlled on today's visit. No changes made to the medications. 

## 2013-11-25 NOTE — Assessment & Plan Note (Signed)
We have suggested she hold her metolazone, continue torsemide as she is currently taking. We'll add spironolactone as she is taking very large amounts of potassium daily with a history of hypokalemia. We'll check a basic metabolic panel today. Goal creatinine 1.2.

## 2013-11-25 NOTE — Assessment & Plan Note (Signed)
Maintaining normal sinus rhythm. She has indicated that she would like to start eliquis once the visiting nurse stopped coming to her house. Dose of 2.5 mg twice a day might need to be used given her renal dysfunction

## 2013-11-25 NOTE — Assessment & Plan Note (Signed)
Dramatic climb in her renal function over the past several months. This is concerning for hypovolemia on her current medication regimen. We will hold metolazone for now, check a basic metabolic panel. Baseline creatinine around 1.2. Weight is down 8 pounds from discharge from the hospital in April 2015. Goal weight is likely around 270 pounds, currently is 262 pounds

## 2013-11-25 NOTE — Telephone Encounter (Signed)
Crysta notified prescription is ready to be picked up at front desk.

## 2013-11-26 LAB — BASIC METABOLIC PANEL
BUN / CREAT RATIO: 36 — AB (ref 11–26)
BUN: 51 mg/dL — AB (ref 8–27)
CALCIUM: 9.4 mg/dL (ref 8.7–10.3)
CHLORIDE: 90 mmol/L — AB (ref 97–108)
CO2: 24 mmol/L (ref 18–29)
Creatinine, Ser: 1.43 mg/dL — ABNORMAL HIGH (ref 0.57–1.00)
GFR calc Af Amer: 40 mL/min/{1.73_m2} — ABNORMAL LOW (ref >59)
GFR calc non Af Amer: 35 mL/min/{1.73_m2} — ABNORMAL LOW (ref >59)
Glucose: 79 mg/dL (ref 65–99)
Potassium: 4.1 mmol/L (ref 3.5–5.2)
SODIUM: 136 mmol/L (ref 134–144)

## 2013-12-01 ENCOUNTER — Telehealth: Payer: Self-pay

## 2013-12-01 NOTE — Telephone Encounter (Signed)
Spoke w/ pt.  Advised her of Dr. Windell HummingbirdGollan's recommendation.  She is agreeable.  She reports that she is interested in setting up outpt procedure if we can arrange this.  Will speak w/ Dr. Mariah MillingGollan about how he would like to proceed.

## 2013-12-01 NOTE — Telephone Encounter (Signed)
Could add back the metolazone that I had held in her office visit Continue on the Lasix twice a day as she was previously taking  Would certainly call us if breathing gets worse We could arrange outpatient visit through radiology for the procedure where she would not need to stay in the hospital

## 2013-12-01 NOTE — Telephone Encounter (Signed)
Spoke w/ pt.  She reports that she saw Dr. Inez Catalinaakes yesterday who suggested that she go to the ED. Advised her that I was instructed to reinforce this suggestion and ask her to go.  She states that she does not want to go to the ED, as she is tired of having fluid drawn off, reports she has had this done twice since Christmas.  She is tearful, stating that she is tired of going through this, as she has someone cook all of her food w/ no salt, she does not eat any beef or pork, she limits her fluids, and she keeps her feet elevated.  She would like to know if Dr. Mariah MillingGollan would agree to letting her adjust her meds at home and see if she can get the fluid off of her lungs on her own.  Pt is emotional when asking this, and is appreciative of everything that Dr. Mariah MillingGollan does for her.

## 2013-12-01 NOTE — Telephone Encounter (Signed)
Pt called, states she is awaiting a call from Dr. Gollan, states  Dr ThelmaMariah Milling Bargeaks was supposed to talk with Dr. Mariah MillingGollan regarding her oxygen level. Please call.

## 2013-12-02 NOTE — Telephone Encounter (Signed)
Spoke w/ pt.  She reports that she has not had a chest x-ray recently. She states that she is feeling better, has lost 1 lb, and she coughed up a "large, gray chunk of phlegm this morning".  Advised her that we can send her over for a chest x-ray to see how much fluid she has or we can wait and see if the antibiotics are helping. She states that she will do whatever Dr. Mariah MillingGollan thinks is best is for her.  Also, pt was advised yesterday to resume her metolazone and to continue her lasix BID, but pt misunderstood and took metolazone twice yesterday.  She reports that she feels better and wonders if she should continue doing this.

## 2013-12-02 NOTE — Telephone Encounter (Signed)
I don't see any recent results in [facility] or [facility].

## 2013-12-02 NOTE — Telephone Encounter (Signed)
Spoke w/ pt.  Advised her that I had spoken w/ Dr. Mariah MillingGollan and that he suggests that her sx sound like infection rather than fluid and he recommends that pt let the abx work.  Advised pt to take her metolazone every other day. She is agreeable and will call on Monday if her sx have not improved.

## 2013-12-02 NOTE — Telephone Encounter (Signed)
Pt's caregiver called wanting to know if procedure will be scheduled for today in case she needs to help her get ready.  Advised pt that I will need to speak w/ Dr. Mariah MillingGollan about how to proceed w/ setting this up.  She is appreciative and will wait for a call back.

## 2013-12-02 NOTE — Telephone Encounter (Signed)
Pt called, wants this set up as soon as possible.

## 2013-12-03 ENCOUNTER — Telehealth: Payer: Self-pay

## 2013-12-03 ENCOUNTER — Ambulatory Visit: Payer: Medicare Other | Admitting: Family Medicine

## 2013-12-03 DIAGNOSIS — I749 Embolism and thrombosis of unspecified artery: Secondary | ICD-10-CM

## 2013-12-03 DIAGNOSIS — Z5181 Encounter for therapeutic drug level monitoring: Secondary | ICD-10-CM

## 2013-12-03 LAB — POCT INR: INR: 1.9

## 2013-12-03 NOTE — Telephone Encounter (Signed)
[facility] called states pt has incresed SOB, currently on 6 L of oxygen, drops to 85 with activity. Right lung base is diminished, weight is 262.

## 2013-12-03 NOTE — Telephone Encounter (Signed)
Spoke w/ Juliette AlcideMelinda, nurse w/ [facility]. Advised her that I have spoken w/ Dr. Mariah MillingGollan and made him aware of pt's sx. Advised her that pt most likely has a pleural effusion, but we do not have a recent cxr for pt. Asked her to have pt call on Monday to set up a chest x-ray. She states that the home health agency can arrange for cxr in pt's home on Monday, but it is low quality and she would prefer for this to be done in the hospital. She advised pt to go to the ED if sx worsen, to which pt again states that she will not go to the ED.

## 2013-12-04 NOTE — Telephone Encounter (Signed)
Need AP, lateral and lateral decubitus film (on the side where the effusion is)  to layer the effusion

## 2013-12-06 NOTE — Telephone Encounter (Signed)
Spoke w/ pt.  She reports that she feels "100% better" and does not want an x-ray. She would like to let Dr. Mariah MillingGollan know that she is continuing to lose wt: Sat   262.8 lbs Sun  262.0 lbs Today 261.4 lbs She states that she previously had issues when she lost this much wt this fast and wants to make Dr. Mariah MillingGollan aware.

## 2013-12-07 ENCOUNTER — Other Ambulatory Visit: Payer: Self-pay

## 2013-12-07 MED ORDER — FUROSEMIDE 80 MG PO TABS: 80.0000 mg | ORAL_TABLET | Freq: Two times a day (BID) | ORAL | Status: DC

## 2013-12-07 NOTE — Telephone Encounter (Signed)
Would hold metolazone for any weight less than 260 Otherwise take metolazone every other day She may have pulled out some fluid, Plus antibiotics probably worked

## 2013-12-07 NOTE — Telephone Encounter (Signed)
Refill sent for furosemide 80 mg

## 2013-12-08 NOTE — Telephone Encounter (Signed)
Attempted to contact pt.   No answer, voicemail box has not been set up.

## 2013-12-09 NOTE — Telephone Encounter (Signed)
Spoke w/ pt.  She reports that her wt has been fluctuating between 259-262. Advised her to Dr. Windell HummingbirdGollan's recommendation.  She is agreeable.  She states that she feels good today, "much, much better". Asked to call if we can be of any assistance.

## 2013-12-11 ENCOUNTER — Encounter: Payer: Self-pay | Admitting: Cardiothoracic Surgery

## 2013-12-15 ENCOUNTER — Telehealth: Payer: Self-pay | Admitting: Family Medicine

## 2013-12-15 MED ORDER — CIPROFLOXACIN HCL 250 MG PO TABS: 250.0000 mg | ORAL_TABLET | Freq: Two times a day (BID) | ORAL | Status: DC

## 2013-12-15 NOTE — Telephone Encounter (Signed)
i am with this, she has f/u with my partner in 6 days.  cipro 250 mg, 1 po bid, #14, 0 ref

## 2013-12-15 NOTE — Telephone Encounter (Signed)
Patient Information:  Caller Name: [name] [name]  Phone: [phone]  Patient: [name] [name], [name] [name]  Gender: Female  DOB: 06/12/1931  Age: 80 Years  PCP: Kerby NoraBedsole, Amy (Family Practice)  Office Follow Up:  Does the office need to follow up with this patient?: Yes  Instructions For The Office: Caller requests Rx instead of scheduling appointment due to transportation issues.   Symptoms  Reason For Call & Symptoms: Patient calling about pain with urination at 15:00 on 12/14/13.  Pain rated at 10 at onset; denies current pain. She reports, "When I stand up I start urinating".  Onset 12/15/13.  Go to Office Now pwer Urination Pain - Female due to Side (flank) or lower back pain present.  Reviewed Health History In EMR: Yes  Reviewed Medications In EMR: Yes  Reviewed Allergies In EMR: Yes  Reviewed Surgeries / Procedures: Yes  Date of Onset of Symptoms: 12/14/2013  Treatments Tried: [facility] Urinary Pain Relief, Oxycodone  Treatments Tried Worked: Yes  Guideline(s) Used:  Urination Pain - Female  Disposition Per Guideline:   Go to Office Now  Reason For Disposition Reached:   Side (flank) or lower back pain present  Advice Given:  Fluids:   Drink extra fluids. Drink 8-10 glasses of liquids a day (Reason: to produce a dilute, non-irritating urine).  Cranberry Juice:   Caution: Do not drink more than 16 oz (480 ml). Here is the reason: too much cranberry juice can also be irritating to the bladder.  Call Back If:  You become worse.  Patient Refused Recommendation:  Patient Requests Prescription  Patient states no transportation; she asks for Rx to be sent to [facility] [place] [phone].

## 2013-12-15 NOTE — Telephone Encounter (Signed)
[name] [name] notified prescription for Cipro 250 mg has been sent to her pharmacy.

## 2013-12-16 ENCOUNTER — Telehealth: Payer: Self-pay

## 2013-12-16 NOTE — Telephone Encounter (Signed)
Faxed to [name]  

## 2013-12-16 NOTE — Telephone Encounter (Signed)
[facility] would like to know the status of orders that were sent over.

## 2013-12-16 NOTE — Telephone Encounter (Signed)
Signed.

## 2013-12-17 ENCOUNTER — Ambulatory Visit (INDEPENDENT_AMBULATORY_CARE_PROVIDER_SITE_OTHER): Payer: Medicare Other | Admitting: Family Medicine

## 2013-12-17 DIAGNOSIS — I749 Embolism and thrombosis of unspecified artery: Secondary | ICD-10-CM

## 2013-12-17 DIAGNOSIS — Z5181 Encounter for therapeutic drug level monitoring: Secondary | ICD-10-CM

## 2013-12-17 LAB — POCT INR: INR: 1.7

## 2013-12-21 ENCOUNTER — Telehealth: Payer: Self-pay | Admitting: Radiology

## 2013-12-21 ENCOUNTER — Encounter: Payer: Self-pay | Admitting: Family Medicine

## 2013-12-21 ENCOUNTER — Ambulatory Visit (INDEPENDENT_AMBULATORY_CARE_PROVIDER_SITE_OTHER): Payer: Medicare Other | Admitting: Family Medicine

## 2013-12-21 VITALS — BP 100/66 | HR 73 | Temp 97.5°F | Ht 64.0 in | Wt 261.5 lb

## 2013-12-21 DIAGNOSIS — N189 Chronic kidney disease, unspecified: Secondary | ICD-10-CM

## 2013-12-21 DIAGNOSIS — N3000 Acute cystitis without hematuria: Secondary | ICD-10-CM

## 2013-12-21 DIAGNOSIS — E876 Hypokalemia: Secondary | ICD-10-CM

## 2013-12-21 DIAGNOSIS — I5033 Acute on chronic diastolic (congestive) heart failure: Secondary | ICD-10-CM

## 2013-12-21 DIAGNOSIS — N179 Acute kidney failure, unspecified: Secondary | ICD-10-CM

## 2013-12-21 DIAGNOSIS — R300 Dysuria: Secondary | ICD-10-CM

## 2013-12-21 DIAGNOSIS — D509 Iron deficiency anemia, unspecified: Secondary | ICD-10-CM

## 2013-12-21 DIAGNOSIS — I10 Essential (primary) hypertension: Secondary | ICD-10-CM

## 2013-12-21 DIAGNOSIS — N390 Urinary tract infection, site not specified: Secondary | ICD-10-CM | POA: Insufficient documentation

## 2013-12-21 LAB — POCT URINALYSIS DIPSTICK
Bilirubin, UA: NEGATIVE
Glucose, UA: NEGATIVE
KETONES UA: NEGATIVE
Leukocytes, UA: NEGATIVE
Nitrite, UA: NEGATIVE
PH UA: 7
Protein, UA: NEGATIVE
RBC UA: NEGATIVE
Spec Grav, UA: 1.01
Urobilinogen, UA: 0.2

## 2013-12-21 LAB — BASIC METABOLIC PANEL
BUN: 48 mg/dL — ABNORMAL HIGH (ref 6–23)
CHLORIDE: 81 meq/L — AB (ref 96–112)
CO2: 30 meq/L (ref 19–32)
Calcium: 9.7 mg/dL (ref 8.4–10.5)
Creatinine, Ser: 1.8 mg/dL — ABNORMAL HIGH (ref 0.4–1.2)
GFR: 28.22 mL/min — ABNORMAL LOW (ref >60.00)
Glucose, Bld: 87 mg/dL (ref 70–99)
POTASSIUM: 2.6 meq/L — AB (ref 3.5–5.1)
SODIUM: 128 meq/L — AB (ref 135–145)

## 2013-12-21 NOTE — Assessment & Plan Note (Signed)
Resolved at last Cbc in 08/2013. D/C ferrous sulfate

## 2013-12-21 NOTE — Telephone Encounter (Signed)
[facility] called a critical K+ - 2.6. Results given to Dr Sharen HonesGutierrez

## 2013-12-21 NOTE — Assessment & Plan Note (Addendum)
UA cleared.  Send for urine culture.

## 2013-12-21 NOTE — Assessment & Plan Note (Addendum)
Pt appears euvolemic today on current regimen of torsemide, occ metolazone and spironolactone.  Will check BMET today and CC to Dr. Mariah MillingGollan.

## 2013-12-21 NOTE — Progress Notes (Signed)
Subjective:    Patient ID: [name] [name], female    DOB: November 05, 1931, 80 y.o.   MRN: [id number]  HPI  80 y/o woman with h/o morbid obesity, sleep apnea and obesity hypoventilation syndrome, atrial fib maintaining NSR on flecainide, DVT on the left and PE in 2007, diastolic HF on chronic diuretics, nonobstructive CAD by cath 8/10 and small AAA, chronic shortness of breath, edema. She presents for routine followup  She saw her cardiologist on 11/25/2013 in follow up.  At that point he was concerned about the rise in her creatinine and held metalozone unless and  Decreased potassium and added spirnolactone 25 mg daily. Cr was found to be down to 1.4 ( baseline previously 1.2)  Her baseline weight is generally around 260 per her. She feels that she is stable with her fluid status, no increase in SOB. Wt Readings from Last 3 Encounters:  12/21/13 261 lb 8 oz (118.616 kg)  11/25/13 263 lb (119.296 kg)  09/28/13 272 lb 8 oz (123.605 kg)   Dr. Mariah Milling also planned on changing coumadin to eliqis for afib.   She has follow up with Dr. Mariah Milling on next week.  Her left leg hematoma is almost healed.  Hypertension:  Well controlled on current regimen. BP Readings from Last 3 Encounters:  12/21/13 100/66  11/25/13 133/79  09/28/13 120/76  Using medication without problems or lightheadedness: None Chest pain with exertion:None Edema: stable Short of breath:stable Average home YQM:VHQION Other issues:   She was having urinary burning, frequency last week. No fever. She was unable to come in.  Dr. Patsy Lager sent in 7 days of cipro 250 mg BID. Tolerated medication. She reports that she was getting better until yesterday when she began having pain return with urination. No fever.  She reports that pain has improved after taking her pain med.   Depression, stable control on lexapro. No SI.     Review of Systems  Constitutional: Negative for fever and fatigue.  HENT: Negative for ear pain.     Eyes: Negative for pain.  Respiratory: Negative for chest tightness.   Cardiovascular: Negative for chest pain and palpitations.  Gastrointestinal: Negative for abdominal pain.  Genitourinary: Negative for dysuria.       Objective:   Physical Exam  Constitutional: Vital signs are normal. She appears well-developed and well-nourished. She is cooperative.  Non-toxic appearance. She does not appear ill. No distress.  obese  HENT:  Head: Normocephalic.  Right Ear: Hearing, tympanic membrane, external ear and ear canal normal. Tympanic membrane is not erythematous, not retracted and not bulging.  Left Ear: Hearing, tympanic membrane, external ear and ear canal normal. Tympanic membrane is not erythematous, not retracted and not bulging.  Nose: No mucosal edema or rhinorrhea. Right sinus exhibits no maxillary sinus tenderness and no frontal sinus tenderness. Left sinus exhibits no maxillary sinus tenderness and no frontal sinus tenderness.  Mouth/Throat: Uvula is midline, oropharynx is clear and moist and mucous membranes are normal.  Eyes: Conjunctivae, EOM and lids are normal. Pupils are equal, round, and reactive to light. Lids are everted and swept, no foreign bodies found.  Neck: Trachea normal and normal range of motion. Neck supple. Carotid bruit is not present. No mass and no thyromegaly present.  Cardiovascular: Normal rate, regular rhythm, S1 normal, S2 normal, normal heart sounds, intact distal pulses and normal pulses.  Exam reveals no gallop and no friction rub.   No murmur heard. no edema, wrinkled skin on ankles  Pulmonary/Chest:  Effort normal and breath sounds normal. Not tachypneic. No respiratory distress. She has no decreased breath sounds. She has no wheezes. She has no rhonchi. She has no rales.  Abdominal: Soft. Normal appearance and bowel sounds are normal. There is no tenderness.  Neurological: She is alert.  Skin: Skin is warm, dry and intact. No rash noted.  Right leg  wound covered with bandage, underneath healing wound, with granulation tissue and bleeding with debriding dressing.  Psychiatric: Her speech is normal and behavior is normal. Judgment and thought content normal. Her mood appears not anxious. Cognition and memory are normal. She does not exhibit a depressed mood.          Assessment & Plan:

## 2013-12-21 NOTE — Patient Instructions (Addendum)
Can stop ferrous sulfate.  Your urine looks clear today, complete antibiotics.  We will call with culture results.  Call if you have more recurrent urinary burning.

## 2013-12-21 NOTE — Assessment & Plan Note (Signed)
Well controlled. Continue current medication.  

## 2013-12-21 NOTE — Assessment & Plan Note (Signed)
Dr. Mariah MillingGollan will likely re-eval on new regimen. Looks like pt told not to take and K but is taking 20 mEq tab.  She is now on spirnolactone.

## 2013-12-21 NOTE — Progress Notes (Signed)
Pre visit review using our clinic review tool, if applicable. No additional management support is needed unless otherwise documented below in the visit note. 

## 2013-12-21 NOTE — Assessment & Plan Note (Addendum)
Recommend Cr check now using metalazone less and on spirnolactone. Will check BMET today.

## 2013-12-21 NOTE — Telephone Encounter (Signed)
I tried to phone this patient several times between 5:30 and 7:00 pm with no answer and no voicemail.  Also tried the two sons listed on demographics with no answer.  I am routing this to Lupita LeashDonna to be taken care of in the morning.

## 2013-12-21 NOTE — Telephone Encounter (Signed)
plz call pt - potassium too low and kidneys a bit worse.  Latest change was addition of spironolactone. plz have pt double up on potassium to 20mEq twice daily for next 3 days then recheck potassium level in office in 2 days (thursday) - lab ordered. Also recommend hold metolazone and decrease lasix to 40mg  twice daily (1/2 tab) until potassium improves

## 2013-12-22 ENCOUNTER — Other Ambulatory Visit: Payer: Self-pay | Admitting: *Deleted

## 2013-12-22 NOTE — Telephone Encounter (Signed)
[name] notified as instructed by telephone.

## 2013-12-22 NOTE — Telephone Encounter (Signed)
Last office visit 12/21/2013.  Last refilled 11/25/2013 for #60 with no refills.  [name] [name] is coming in Thursday afternoon for lab work and would like to pick up her prescription then.

## 2013-12-23 ENCOUNTER — Telehealth: Payer: Self-pay | Admitting: Radiology

## 2013-12-23 ENCOUNTER — Other Ambulatory Visit (INDEPENDENT_AMBULATORY_CARE_PROVIDER_SITE_OTHER): Payer: Medicare Other

## 2013-12-23 ENCOUNTER — Encounter: Payer: Self-pay | Admitting: Family Medicine

## 2013-12-23 ENCOUNTER — Telehealth: Payer: Self-pay | Admitting: *Deleted

## 2013-12-23 DIAGNOSIS — E876 Hypokalemia: Secondary | ICD-10-CM

## 2013-12-23 LAB — RENAL FUNCTION PANEL
Albumin: 3.3 g/dL — ABNORMAL LOW (ref 3.5–5.2)
BUN: 52 mg/dL — AB (ref 6–23)
CHLORIDE: 81 meq/L — AB (ref 96–112)
CO2: 32 meq/L (ref 19–32)
Calcium: 9.6 mg/dL (ref 8.4–10.5)
Creatinine, Ser: 1.7 mg/dL — ABNORMAL HIGH (ref 0.4–1.2)
GFR: 30.11 mL/min — ABNORMAL LOW (ref >60.00)
Glucose, Bld: 90 mg/dL (ref 70–99)
POTASSIUM: 2.8 meq/L — AB (ref 3.5–5.1)
Phosphorus: 3.3 mg/dL (ref 2.3–4.6)
Sodium: 129 mEq/L — ABNORMAL LOW (ref 135–145)

## 2013-12-23 MED ORDER — OXYCODONE HCL ER 10 MG PO T12A: 10.0000 mg | EXTENDED_RELEASE_TABLET | Freq: Two times a day (BID) | ORAL | Status: DC

## 2013-12-23 NOTE — Telephone Encounter (Signed)
[facility] called a critical K= - 2.8, results given to Dr Ermalene SearingBedsole

## 2013-12-23 NOTE — Telephone Encounter (Signed)
Ms. [name] [name] called to let Dr. Ermalene [name] know since last night she has put on 3 lbs.  She is concerned if she doesn't take her normal Lasix dose she will end up in the hospital.  Per Dr. Ermalene [name]:  [name] [name] for patient to restart her normal dose of Lasix, 80 mg two times a day.  Still continue to hold the metolazone and double up on her potassium.  [name] [name] states understanding.

## 2013-12-23 NOTE — Telephone Encounter (Signed)
Ms. [name] [name] notified prescription will be ready for her to pick up this afternoon when she comes in for her lab appointment.

## 2013-12-23 NOTE — Telephone Encounter (Signed)
Pt will continue potassium for now and restart lasix as she is fluid overloaded.  Has upcoming appt with card in 4 days.

## 2013-12-24 LAB — URINE CULTURE: Colony Count: 40000

## 2013-12-24 MED ORDER — SULFAMETHOXAZOLE-TRIMETHOPRIM 800-160 MG PO TABS: 1.0000 | ORAL_TABLET | Freq: Two times a day (BID) | ORAL | Status: DC

## 2013-12-24 NOTE — Addendum Note (Signed)
Addended by: Kerby NoraBEDSOLE, Madysin Crisp E on: 12/24/2013 08:24 AM   Modules accepted: Orders

## 2013-12-28 ENCOUNTER — Encounter: Payer: Self-pay | Admitting: Cardiovascular Disease

## 2013-12-28 ENCOUNTER — Ambulatory Visit (INDEPENDENT_AMBULATORY_CARE_PROVIDER_SITE_OTHER): Payer: Medicare Other | Admitting: Cardiovascular Disease

## 2013-12-28 ENCOUNTER — Ambulatory Visit (INDEPENDENT_AMBULATORY_CARE_PROVIDER_SITE_OTHER): Payer: Medicare Other | Admitting: Family Medicine

## 2013-12-28 VITALS — BP 140/72 | HR 75 | Ht 64.0 in | Wt 262.8 lb

## 2013-12-28 DIAGNOSIS — E876 Hypokalemia: Secondary | ICD-10-CM

## 2013-12-28 DIAGNOSIS — S81809A Unspecified open wound, unspecified lower leg, initial encounter: Secondary | ICD-10-CM

## 2013-12-28 DIAGNOSIS — E7800 Pure hypercholesterolemia, unspecified: Secondary | ICD-10-CM

## 2013-12-28 DIAGNOSIS — N179 Acute kidney failure, unspecified: Secondary | ICD-10-CM

## 2013-12-28 DIAGNOSIS — I749 Embolism and thrombosis of unspecified artery: Secondary | ICD-10-CM

## 2013-12-28 DIAGNOSIS — Z5189 Encounter for other specified aftercare: Secondary | ICD-10-CM

## 2013-12-28 DIAGNOSIS — S81801D Unspecified open wound, right lower leg, subsequent encounter: Secondary | ICD-10-CM

## 2013-12-28 DIAGNOSIS — I5033 Acute on chronic diastolic (congestive) heart failure: Secondary | ICD-10-CM

## 2013-12-28 DIAGNOSIS — Z5181 Encounter for therapeutic drug level monitoring: Secondary | ICD-10-CM

## 2013-12-28 DIAGNOSIS — I480 Paroxysmal atrial fibrillation: Secondary | ICD-10-CM

## 2013-12-28 DIAGNOSIS — I10 Essential (primary) hypertension: Secondary | ICD-10-CM

## 2013-12-28 DIAGNOSIS — N189 Chronic kidney disease, unspecified: Secondary | ICD-10-CM

## 2013-12-28 DIAGNOSIS — R0602 Shortness of breath: Secondary | ICD-10-CM

## 2013-12-28 DIAGNOSIS — I4891 Unspecified atrial fibrillation: Secondary | ICD-10-CM

## 2013-12-28 LAB — POCT INR: INR: 1.8

## 2013-12-28 NOTE — Assessment & Plan Note (Signed)
Maintaining normal sinus rhythm. We'll continue on her current medications

## 2013-12-28 NOTE — Assessment & Plan Note (Addendum)
She appears on the dry side today. Creatinine is above her baseline. Suggested she continue on Lasix 40 mg twice a day. If she does develop lower extremity edema, could transiently increase her Lasix up to 80 mg twice a day. Basic metabolic panel checked today for potassium level. Encouraged to stay on her spironolactone. If potassium continues to be a problem, this could be increased up to 50 mg daily

## 2013-12-28 NOTE — Assessment & Plan Note (Signed)
We have encouraged continued  careful diet management in an effort to lose weight. She is unable to exercise.

## 2013-12-28 NOTE — Assessment & Plan Note (Signed)
Blood pressure is well controlled on today's visit. No changes made to the medications. 

## 2013-12-28 NOTE — Assessment & Plan Note (Signed)
Likely has chronic mild renal disease, exacerbated by aggressive diuresis.

## 2013-12-28 NOTE — Patient Instructions (Signed)
You are doing well.  Please continue lasix 1/2 pill twice a day (40 mg twice a day) Increase up to 80 mg twice a day only for leg swelling  Go back to the 1/2 pills once the swelling gets better Take potassium twice a day  We will check you labs today  Please call us if you have new issues that need to be addressed before your next appt.  Your physician wants you to follow-up in: 1 month.

## 2013-12-28 NOTE — Assessment & Plan Note (Signed)
Encouraged her to stay on her Lipitor 

## 2013-12-28 NOTE — Assessment & Plan Note (Signed)
She reports that her wound is close to healing, possibly 1 inch in size

## 2013-12-28 NOTE — Progress Notes (Signed)
Patient ID: [name] [name], female    DOB: 06/22/1931, 80 y.o.   MRN: [id number]  HPI Comments: 80 y/o woman with h/o morbid obesity, sleep apnea and obesity hypoventilation syndrome, atrial fib maintaining NSR on flecainide, DVT on the left and PE in 2007, diastolic HF on chronic diuretics, nonobstructive CAD by cath 8/10 and small AAA, chronic shortness of breath, edema.  She presents for routine followup   in the hospital 08/31/2013. heart failure symptoms, had aggressive diuresis in the hospital with 15 pound weight drop.  Over the past several months her creatinine has slowly climbed from her baseline 1.2, up to 1.6, 1.8   For her climb in creatinine, we have held her metolazone She is taking Lasix 80 mg twice a day (there is some medication confusion). "I am taking what it says on the paper" She is uncertain if she is taking spironolactone but this was filled at the pharmacy one month ago.  For her while she was taking Lasix 40 mg twice a day.  the wound on her right lower extremity is healing well. Overall she has no complaints. She is taking 2 potassium pills daily She is currently being treated for urinary tract infection with Bactrim Recent potassium 2.8  Previous episode of syncope/loss of consciousness in February 2014 on Valentine's Day. Creatinine at that time was 1.7 .She was given IV fluids. secondary to a huge hematoma of her right leg, she has had months of wound evaluation. Not a candidate for skin grafting. She has a leg wrap on the right. No swelling of her left lower extremity, only the right. Region of skin loss on the right is extensive. discharge 07/27/2012   hospitalization 07/14/2013 for epistaxis. INR was 3.7. She had her nose packed and cauterized. Cardiology evaluated the patient on 06/17/2013. Flecainide and diltiazem was continued. She was discharged on 06/18/2013 Lab work in the hospital 06/16/2013 showed creatinine 1.84, BUN 60, potassium 3.1  Echocardiogram in  the hospital 06/27/2012 was essentially normal, mildly dilated left atrium  EKG shows normal sinus rhythm with rate of 75 beats per minute, sinus arrhythmia, no significant ST or T wave changes   Outpatient Encounter Prescriptions as of 12/28/2013  Medication Sig  . allopurinol (ZYLOPRIM) 100 MG tablet Take 1 tablet (100 mg total) by mouth daily.  . Ascorbic Acid (VITAMIN C PO) Take 1 tablet by mouth daily.  [name]. aspirin (ASPIR-81) 81 MG EC tablet Take 81 mg by mouth daily.    [name]. atorvastatin (LIPITOR) 10 MG tablet Take 1 tablet (10 mg total) by mouth daily. Please schedule a physical, having fasting labs prior, for any additional refills prior.  . cetirizine (ZYRTEC ALLERGY) 10 MG tablet Take 10 mg by mouth daily.    . colchicine 0.6 MG tablet Take 0.6-1.2 mg by mouth daily as needed (for gout flare up).  . diltiazem (CARDIZEM CD) 120 MG 24 hr capsule Take 1 capsule (120 mg total) by mouth daily.  [name]. escitalopram (LEXAPRO) 20 MG tablet Take 1 tablet (20 mg total) by mouth daily.  . flecainide (TAMBOCOR) 100 MG tablet Take 1 tablet (100 mg total) by mouth every 12 (twelve) hours.  . furosemide (LASIX) 80 MG tablet Take 1 tablet (80 mg total) by mouth 2 (two) times daily.  [name]. ipratropium (ATROVENT HFA) 17 MCG/ACT inhaler Inhale 2 puffs into the lungs 4 (four) times daily.  . metolazone (ZAROXOLYN) 2.5 MG tablet Take 2.5 mg by mouth daily as needed. Only take with increased weight.  .[name]  NON FORMULARY Oxygen 2.5 liters daily  . OxyCODONE (OXYCONTIN) 10 mg T12A 12 hr tablet Take 1 tablet (10 mg total) by mouth every 12 (twelve) hours.  . potassium chloride SA (K-DUR,KLOR-CON) 20 MEQ tablet Take 40 mEq by mouth daily.   [name] spironolactone (ALDACTONE) 25 MG tablet Take 1 tablet (25 mg total) by mouth daily.  [name] sulfamethoxazole-trimethoprim (SEPTRA DS) 800-160 MG per tablet Take 1 tablet by mouth 2 (two) times daily.  [name] warfarin (COUMADIN) 2.5 MG tablet Take 1.25-2.5 mg by mouth See admin instructions.  Alternate every other day with 0.5 -1 tablet    Review of Systems  Constitutional: Negative.   HENT: Negative.   Eyes: Negative.   Respiratory: Negative.   Cardiovascular: Negative.   Gastrointestinal: Negative.   Endocrine: Negative.   Musculoskeletal: Positive for back pain.  Skin: Negative.   Allergic/Immunologic: Negative.   Neurological: Negative.   Hematological: Negative.   Psychiatric/Behavioral: Negative.   All other systems reviewed and are negative.   BP 140/72  Pulse 75  Ht 5\' 4"  (1.626 m)  Wt 262 lb 12 oz (119.183 kg)  BMI 45.08 kg/m2  Physical Exam  Nursing note and vitals reviewed. Constitutional: She is oriented to person, place, and time. She appears well-developed and well-nourished.  Obese, on oxygen, right lower extremity with wrap in place  HENT:  Head: Normocephalic.  Nose: Nose normal.  Mouth/Throat: Oropharynx is clear and moist.  Eyes: Conjunctivae are normal. Pupils are equal, round, and reactive to light.  Neck: Normal range of motion. Neck supple. No JVD present.  Cardiovascular: Normal rate, regular rhythm, S1 normal, S2 normal, normal heart sounds and intact distal pulses.  Exam reveals no gallop and no friction rub.   No murmur heard. No significant edema  Pulmonary/Chest: Effort normal and breath sounds normal. No respiratory distress. She has no wheezes. She has no rales. She exhibits no tenderness.  Abdominal: Soft. Bowel sounds are normal. She exhibits no distension. There is no tenderness.  Musculoskeletal: Normal range of motion. She exhibits no edema and no tenderness.  Lymphadenopathy:    She has no cervical adenopathy.  Neurological: She is alert and oriented to person, place, and time. Coordination normal.  Skin: Skin is warm and dry. No rash noted. No erythema.  Leg wrap on the right lower extremity  Psychiatric: She has a normal mood and affect. Her behavior is normal. Judgment and thought content normal.    Assessment and  Plan

## 2013-12-28 NOTE — Assessment & Plan Note (Signed)
Currently taking 2 potassium pills per day, taking her spironolactone at least per the pharmacy. Repeat potassium level checked today.

## 2013-12-29 LAB — BASIC METABOLIC PANEL
BUN / CREAT RATIO: 20 (ref 11–26)
BUN: 36 mg/dL — ABNORMAL HIGH (ref 8–27)
CO2: 28 mmol/L (ref 18–29)
CREATININE: 1.76 mg/dL — AB (ref 0.57–1.00)
Calcium: 9.5 mg/dL (ref 8.7–10.3)
Chloride: 89 mmol/L — ABNORMAL LOW (ref 97–108)
GFR calc non Af Amer: 27 mL/min/{1.73_m2} — ABNORMAL LOW (ref >59)
GFR, EST AFRICAN AMERICAN: 31 mL/min/{1.73_m2} — AB (ref >59)
GLUCOSE: 91 mg/dL (ref 65–99)
Potassium: 5.5 mmol/L — ABNORMAL HIGH (ref 3.5–5.2)
Sodium: 135 mmol/L (ref 134–144)

## 2013-12-31 ENCOUNTER — Telehealth: Payer: Self-pay

## 2013-12-31 NOTE — Telephone Encounter (Signed)
Pt has some questions regarding her medications

## 2013-12-31 NOTE — Telephone Encounter (Signed)
Spoke w/ pt. She wanted to go thru her meds and clarify that she is taking them correctly.   Pt verbalizes understanding and will call w/ any other questions or concerns.

## 2014-01-07 ENCOUNTER — Telehealth: Payer: Self-pay | Admitting: Family Medicine

## 2014-01-07 NOTE — Telephone Encounter (Signed)
Yes it would be helpful in this pt.

## 2014-01-07 NOTE — Telephone Encounter (Signed)
The [facility] nurse manager came by with a list of patients who had high Medicare claim amounts in the past 12 months due to hospital admissions.  This patient was on the list.  Would you like for [facility] to reach out to the patient?  If so, I will place a referral form in your box and you can give it to your CMA to fax to [facility]. 

## 2014-01-10 NOTE — Telephone Encounter (Signed)
Referral form placed in Dr. Daphine Deutscher inbox.

## 2014-01-12 ENCOUNTER — Other Ambulatory Visit: Payer: Self-pay | Admitting: *Deleted

## 2014-01-12 MED ORDER — ATORVASTATIN CALCIUM 10 MG PO TABS: 10.0000 mg | ORAL_TABLET | Freq: Every day | ORAL | Status: DC

## 2014-01-14 ENCOUNTER — Ambulatory Visit (INDEPENDENT_AMBULATORY_CARE_PROVIDER_SITE_OTHER): Payer: Medicare Other | Admitting: Family Medicine

## 2014-01-14 DIAGNOSIS — I749 Embolism and thrombosis of unspecified artery: Secondary | ICD-10-CM

## 2014-01-14 DIAGNOSIS — Z5181 Encounter for therapeutic drug level monitoring: Secondary | ICD-10-CM

## 2014-01-14 DIAGNOSIS — I480 Paroxysmal atrial fibrillation: Secondary | ICD-10-CM

## 2014-01-14 DIAGNOSIS — I4891 Unspecified atrial fibrillation: Secondary | ICD-10-CM

## 2014-01-14 LAB — POCT INR: INR: 1.5

## 2014-01-24 ENCOUNTER — Ambulatory Visit: Payer: Self-pay | Admitting: Ophthalmology

## 2014-01-24 LAB — PROTIME-INR
INR: 1.4 — AB (ref 0.9–1.1)
INR: 1.4
Prothrombin Time: 17.3 secs — ABNORMAL HIGH (ref 11.5–14.7)

## 2014-01-24 LAB — POTASSIUM: Potassium: 3.2 mmol/L — ABNORMAL LOW (ref 3.5–5.1)

## 2014-01-25 ENCOUNTER — Encounter: Payer: Self-pay | Admitting: Cardiothoracic Surgery

## 2014-01-25 ENCOUNTER — Other Ambulatory Visit: Payer: Self-pay | Admitting: *Deleted

## 2014-01-25 MED ORDER — FLECAINIDE ACETATE 100 MG PO TABS: 100.0000 mg | ORAL_TABLET | Freq: Two times a day (BID) | ORAL | Status: DC

## 2014-01-25 MED ORDER — SPIRONOLACTONE 25 MG PO TABS: 25.0000 mg | ORAL_TABLET | Freq: Every day | ORAL | Status: DC

## 2014-01-25 NOTE — Telephone Encounter (Signed)
Requested Prescriptions   Signed Prescriptions Disp Refills  . flecainide (TAMBOCOR) 100 MG tablet 60 tablet 3    Sig: Take 1 tablet (100 mg total) by mouth every 12 (twelve) hours.    Authorizing Provider: Antonieta Iba    Ordering User: Kendrick Fries spironolactone (ALDACTONE) 25 MG tablet 135 tablet 3    Sig: Take 1 tablet (25 mg total) by mouth daily. Take 1 1/2 tablet by mouth daily.    Authorizing Provider: Antonieta Iba    Ordering User: Kendrick Fries

## 2014-01-26 ENCOUNTER — Encounter: Payer: Self-pay | Admitting: Cardiovascular Disease

## 2014-01-26 ENCOUNTER — Ambulatory Visit (INDEPENDENT_AMBULATORY_CARE_PROVIDER_SITE_OTHER): Payer: Medicare Other | Admitting: Cardiovascular Disease

## 2014-01-26 VITALS — BP 100/76 | HR 75 | Ht 65.0 in | Wt 267.2 lb

## 2014-01-26 DIAGNOSIS — S81809A Unspecified open wound, unspecified lower leg, initial encounter: Secondary | ICD-10-CM

## 2014-01-26 DIAGNOSIS — E876 Hypokalemia: Secondary | ICD-10-CM

## 2014-01-26 DIAGNOSIS — I10 Essential (primary) hypertension: Secondary | ICD-10-CM

## 2014-01-26 DIAGNOSIS — S81801D Unspecified open wound, right lower leg, subsequent encounter: Secondary | ICD-10-CM

## 2014-01-26 DIAGNOSIS — I4891 Unspecified atrial fibrillation: Secondary | ICD-10-CM

## 2014-01-26 DIAGNOSIS — I5033 Acute on chronic diastolic (congestive) heart failure: Secondary | ICD-10-CM

## 2014-01-26 DIAGNOSIS — Z5189 Encounter for other specified aftercare: Secondary | ICD-10-CM

## 2014-01-26 MED ORDER — SPIRONOLACTONE 25 MG PO TABS: 37.5000 mg | ORAL_TABLET | Freq: Every day | ORAL | Status: DC

## 2014-01-26 MED ORDER — POTASSIUM CHLORIDE ER 10 MEQ PO TBCR
10.0000 meq | EXTENDED_RELEASE_TABLET | Freq: Every day | ORAL | Status: DC
Start: 2014-01-26 — End: 2014-03-02

## 2014-01-26 NOTE — Assessment & Plan Note (Signed)
Maintaining normal sinus rhythm. We'll continue flecainide

## 2014-01-26 NOTE — Assessment & Plan Note (Signed)
She continues to have a wound requiring a wrap. This has been slow to heal

## 2014-01-26 NOTE — Patient Instructions (Addendum)
You are doing well.  Continue on spironolactone 1 1/2 once a day (same dose) Start potassium 10 meq  One a day Stay on the furosemide 80 mg twice a day  Stop the aspirin  Please call us if you have new issues that need to be addressed before your next appt.  Your physician wants you to follow-up in: 1 month.

## 2014-01-26 NOTE — Progress Notes (Signed)
Patient ID: [name] [name], female    DOB: [birth year], 80 y.o.   MRN: [id number]  HPI Comments: 80 y/o woman with h/o morbid obesity, sleep apnea and obesity hypoventilation syndrome, atrial fib maintaining NSR on flecainide, DVT on the left and PE in 2007, diastolic HF on chronic diuretics, nonobstructive CAD by cath 8/10 and small AAA, chronic shortness of breath, edema.  She presents for routine followup  In followup today, she continues to have wound healing issues of her right lower extremity which has been in a wrap for the past 2 years. It is slowly getting better. She was concerned about 3 pound weight gain and she increased her Lasix from 40 mg twice a day up to 80 mg twice a day. Recent potassium 3.2. She has not been taking oral potassium as she ran out. She does take Aldactone 37.5 mg daily Reports her weight has been relatively stable, no significant leg edema   in the hospital 08/31/2013. heart failure symptoms, had aggressive diuresis in the hospital with 15 pound weight drop.  Over the past several months her creatinine has slowly climbed from her baseline 1.2, up to 1.6, 1.8   Previous episode of syncope/loss of consciousness in February 2014 on Valentine's Day. Creatinine at that time was 1.7 .She was given IV fluids. secondary to a huge hematoma of her right leg, she has had months of wound evaluation. Not a candidate for skin grafting. She has a leg wrap on the right. No swelling of her left lower extremity, only the right. Region of skin loss on the right is extensive. discharge 07/27/2012   hospitalization 07/14/2013 for epistaxis. INR was 3.7. She had her nose packed and cauterized. Cardiology evaluated the patient on 06/17/2013. Flecainide and diltiazem was continued. She was discharged on 06/18/2013 Lab work in the hospital 06/16/2013 showed creatinine 1.84, BUN 60, potassium 3.1  Echocardiogram in the hospital 06/27/2012 was essentially normal, mildly dilated left  atrium  EKG shows normal sinus rhythm with rate of 75 beats per minute,  no significant ST or T wave changes   Outpatient Encounter Prescriptions as of 01/26/2014  Medication Sig  . allopurinol (ZYLOPRIM) 100 MG tablet Take 1 tablet (100 mg total) by mouth daily.  [name] aspirin (ASPIR-81) 81 MG EC tablet Take 81 mg by mouth daily.    [name] atorvastatin (LIPITOR) 10 MG tablet Take 1 tablet (10 mg total) by mouth daily.  . cetirizine (ZYRTEC ALLERGY) 10 MG tablet Take 10 mg by mouth daily.    . colchicine 0.6 MG tablet Take 0.6-1.2 mg by mouth daily as needed (for gout flare up).  . diltiazem (CARDIZEM CD) 120 MG 24 hr capsule Take 1 capsule (120 mg total) by mouth daily.  [name] escitalopram (LEXAPRO) 20 MG tablet Take 1 tablet (20 mg total) by mouth daily.  . flecainide (TAMBOCOR) 100 MG tablet Take 1 tablet (100 mg total) by mouth every 12 (twelve) hours.  . furosemide (LASIX) 80 MG tablet Take 1 tablet (80 mg total) by mouth 2 (two) times daily.  [name] ipratropium (ATROVENT HFA) 17 MCG/ACT inhaler Inhale 2 puffs into the lungs 4 (four) times daily.  . metolazone (ZAROXOLYN) 2.5 MG tablet Take 2.5 mg by mouth daily as needed. Only take with increased weight.  . NON FORMULARY Oxygen 2.5 liters daily  . OxyCODONE (OXYCONTIN) 10 mg T12A 12 hr tablet Take 1 tablet (10 mg total) by mouth every 12 (twelve) hours.  [name] spironolactone (ALDACTONE) 25 MG tablet Take 1.5  tablets (37.5 mg total) by mouth daily.  [name] warfarin (COUMADIN) 2.5 MG tablet Take 1.25-2.5 mg by mouth See admin instructions. Alternate every other day with 0.5 -1 tablet    Review of Systems  Constitutional: Negative.   HENT: Negative.   Eyes: Negative.   Respiratory: Negative.   Cardiovascular: Negative.   Gastrointestinal: Negative.   Endocrine: Negative.   Musculoskeletal: Positive for gait problem.  Skin: Negative.   Allergic/Immunologic: Negative.   Neurological: Negative.   Hematological: Negative.   Psychiatric/Behavioral: Negative.    All other systems reviewed and are negative.   BP 100/76  Pulse 75  Ht  (1.651 m)  Wt 267 lb 4 oz (121.224 kg)  BMI 44.47 kg/m2  Physical Exam  Nursing note and vitals reviewed. Constitutional: She is oriented to person, place, and time. She appears well-developed and well-nourished.  Obese, on oxygen, right lower extremity with wrap in place  HENT:  Head: Normocephalic.  Nose: Nose normal.  Mouth/Throat: Oropharynx is clear and moist.  Eyes: Conjunctivae are normal. Pupils are equal, round, and reactive to light.  Neck: Normal range of motion. Neck supple. No JVD present.  Cardiovascular: Normal rate, regular rhythm, S1 normal, S2 normal, normal heart sounds and intact distal pulses.  Exam reveals no gallop and no friction rub.   No murmur heard. No significant edema  Pulmonary/Chest: Effort normal and breath sounds normal. No respiratory distress. She has no wheezes. She has no rales. She exhibits no tenderness.  Abdominal: Soft. Bowel sounds are normal. She exhibits no distension. There is no tenderness.  Musculoskeletal: Normal range of motion. She exhibits no edema and no tenderness.  Lymphadenopathy:    She has no cervical adenopathy.  Neurological: She is alert and oriented to person, place, and time. Coordination normal.  Skin: Skin is warm and dry. No rash noted. No erythema.  Leg wrap on the right lower extremity  Psychiatric: She has a normal mood and affect. Her behavior is normal. Judgment and thought content normal.    Assessment and Plan

## 2014-01-26 NOTE — Assessment & Plan Note (Signed)
We'll continue Aldactone. Suggested she start K. Dur 10 mEq daily, take 2 tabs today

## 2014-01-26 NOTE — Assessment & Plan Note (Signed)
Unable to exercise. Encouraged strict diet

## 2014-01-26 NOTE — Assessment & Plan Note (Signed)
She appears relatively euvolemic, possibly dehydrated though she is concerned about recent weight gain. She is back on Lasix 80 mg twice a day. We'll monitor for now

## 2014-01-26 NOTE — Assessment & Plan Note (Signed)
Blood pressure is low today. We have discussed this with her. Is concerning for mild dehydration. She will increase her fluids

## 2014-01-28 ENCOUNTER — Ambulatory Visit (INDEPENDENT_AMBULATORY_CARE_PROVIDER_SITE_OTHER): Payer: Medicare Other | Admitting: Family Medicine

## 2014-01-28 DIAGNOSIS — I749 Embolism and thrombosis of unspecified artery: Secondary | ICD-10-CM

## 2014-01-28 DIAGNOSIS — Z5181 Encounter for therapeutic drug level monitoring: Secondary | ICD-10-CM

## 2014-01-28 LAB — POCT INR: INR: 1.5

## 2014-02-01 ENCOUNTER — Ambulatory Visit: Payer: Self-pay | Admitting: Ophthalmology

## 2014-02-07 ENCOUNTER — Ambulatory Visit (INDEPENDENT_AMBULATORY_CARE_PROVIDER_SITE_OTHER): Payer: Medicare Other | Admitting: *Deleted

## 2014-02-07 DIAGNOSIS — Z5181 Encounter for therapeutic drug level monitoring: Secondary | ICD-10-CM

## 2014-02-07 DIAGNOSIS — I749 Embolism and thrombosis of unspecified artery: Secondary | ICD-10-CM

## 2014-02-07 LAB — POCT INR: INR: 1.4

## 2014-02-10 ENCOUNTER — Encounter: Payer: Self-pay | Admitting: Cardiothoracic Surgery

## 2014-02-10 HISTORY — PX: CARDIAC CATHETERIZATION: SHX172

## 2014-02-17 ENCOUNTER — Telehealth: Payer: Self-pay | Admitting: Family Medicine

## 2014-02-17 NOTE — Telephone Encounter (Signed)
Patient Information:  Caller Name: [name]  Phone: [phone]  Patient: [name] [name], [name] [name]  Gender: Female  DOB: 08/18/1931  Age: 80 Years  PCP: Other  Office Follow Up:  Does the office need to follow up with this patient?: No  Instructions For The Office: N/A  RN Note:  Advised caregiver to call 911 now. STates she will do so now.  Symptoms  Reason For Call & Symptoms: Care giver calling with pt complaint of shortness of breath. Visibly struggling to breath. Hx of COPD. HH RN had to turn pt's O2 upt to above 6 and advised her to call PCP. Pt normally has O2 on 2-2.5 L. O2 sat is only in 60's and 70's with increase in O2.  Reviewed Health History In EMR: Yes  Reviewed Medications In EMR: Yes  Reviewed Allergies In EMR: Yes  Reviewed Surgeries / Procedures: Yes  Date of Onset of Symptoms: 02/17/2014  Treatments Tried: increased O2  Treatments Tried Worked: No  Guideline(s) Used:  Breathing Difficulty  Disposition Per Guideline:   Call EMS 911 Now  Reason For Disposition Reached:   Severe difficulty breathing (e.g., struggling for each breath, speaks in single words, pulse > 120)  Advice Given:  N/A  Patient Will Follow Care Advice:  YES

## 2014-02-17 NOTE — Telephone Encounter (Signed)
This was sent to me.

## 2014-02-17 NOTE — Telephone Encounter (Signed)
Can you please route to proper person? Thanks

## 2014-02-18 ENCOUNTER — Inpatient Hospital Stay (HOSPITAL_COMMUNITY)
Admission: EM | Admit: 2014-02-18 | Discharge: 2014-03-02 | DRG: 193 | Disposition: A | Discharge status: Other Acute Inpatient Hospital | Payer: Medicare Other | Attending: Internal Medicine | Admitting: Internal Medicine

## 2014-02-18 ENCOUNTER — Telehealth: Payer: Self-pay

## 2014-02-18 ENCOUNTER — Emergency Department (HOSPITAL_COMMUNITY): Payer: Medicare Other

## 2014-02-18 ENCOUNTER — Ambulatory Visit: Payer: Self-pay | Admitting: Internal Medicine

## 2014-02-18 ENCOUNTER — Encounter (HOSPITAL_COMMUNITY): Payer: Self-pay | Admitting: Emergency Medicine

## 2014-02-18 ENCOUNTER — Telehealth: Payer: Self-pay | Admitting: Family Medicine

## 2014-02-18 DIAGNOSIS — Z6841 Body Mass Index (BMI) 40.0 and over, adult: Secondary | ICD-10-CM

## 2014-02-18 DIAGNOSIS — I2510 Atherosclerotic heart disease of native coronary artery without angina pectoris: Secondary | ICD-10-CM | POA: Diagnosis present

## 2014-02-18 DIAGNOSIS — T81718A Complication of other artery following a procedure, not elsewhere classified, initial encounter: Secondary | ICD-10-CM

## 2014-02-18 DIAGNOSIS — J9690 Respiratory failure, unspecified, unspecified whether with hypoxia or hypercapnia: Secondary | ICD-10-CM

## 2014-02-18 DIAGNOSIS — I480 Paroxysmal atrial fibrillation: Secondary | ICD-10-CM | POA: Diagnosis present

## 2014-02-18 DIAGNOSIS — J9621 Acute and chronic respiratory failure with hypoxia: Secondary | ICD-10-CM | POA: Diagnosis present

## 2014-02-18 DIAGNOSIS — N183 Chronic kidney disease, stage 3 unspecified: Secondary | ICD-10-CM

## 2014-02-18 DIAGNOSIS — K5900 Constipation, unspecified: Secondary | ICD-10-CM

## 2014-02-18 DIAGNOSIS — M1000 Idiopathic gout, unspecified site: Secondary | ICD-10-CM | POA: Diagnosis present

## 2014-02-18 DIAGNOSIS — I482 Chronic atrial fibrillation, unspecified: Secondary | ICD-10-CM

## 2014-02-18 DIAGNOSIS — I5033 Acute on chronic diastolic (congestive) heart failure: Secondary | ICD-10-CM | POA: Diagnosis present

## 2014-02-18 DIAGNOSIS — Z9981 Dependence on supplemental oxygen: Secondary | ICD-10-CM

## 2014-02-18 DIAGNOSIS — E119 Type 2 diabetes mellitus without complications: Secondary | ICD-10-CM | POA: Diagnosis present

## 2014-02-18 DIAGNOSIS — Z86718 Personal history of other venous thrombosis and embolism: Secondary | ICD-10-CM | POA: Diagnosis not present

## 2014-02-18 DIAGNOSIS — N179 Acute kidney failure, unspecified: Secondary | ICD-10-CM

## 2014-02-18 DIAGNOSIS — M109 Gout, unspecified: Secondary | ICD-10-CM

## 2014-02-18 DIAGNOSIS — G9340 Encephalopathy, unspecified: Secondary | ICD-10-CM | POA: Diagnosis not present

## 2014-02-18 DIAGNOSIS — L97909 Non-pressure chronic ulcer of unspecified part of unspecified lower leg with unspecified severity: Secondary | ICD-10-CM | POA: Diagnosis present

## 2014-02-18 DIAGNOSIS — Z79899 Other long term (current) drug therapy: Secondary | ICD-10-CM

## 2014-02-18 DIAGNOSIS — I272 Other secondary pulmonary hypertension: Secondary | ICD-10-CM | POA: Diagnosis present

## 2014-02-18 DIAGNOSIS — E876 Hypokalemia: Secondary | ICD-10-CM | POA: Diagnosis present

## 2014-02-18 DIAGNOSIS — E785 Hyperlipidemia, unspecified: Secondary | ICD-10-CM | POA: Diagnosis present

## 2014-02-18 DIAGNOSIS — I5032 Chronic diastolic (congestive) heart failure: Secondary | ICD-10-CM

## 2014-02-18 DIAGNOSIS — R911 Solitary pulmonary nodule: Secondary | ICD-10-CM | POA: Diagnosis present

## 2014-02-18 DIAGNOSIS — E662 Morbid (severe) obesity with alveolar hypoventilation: Secondary | ICD-10-CM | POA: Diagnosis present

## 2014-02-18 DIAGNOSIS — J189 Pneumonia, unspecified organism: Secondary | ICD-10-CM | POA: Diagnosis present

## 2014-02-18 DIAGNOSIS — I2699 Other pulmonary embolism without acute cor pulmonale: Secondary | ICD-10-CM

## 2014-02-18 DIAGNOSIS — E7800 Pure hypercholesterolemia, unspecified: Secondary | ICD-10-CM

## 2014-02-18 DIAGNOSIS — K219 Gastro-esophageal reflux disease without esophagitis: Secondary | ICD-10-CM | POA: Diagnosis present

## 2014-02-18 DIAGNOSIS — I10 Essential (primary) hypertension: Secondary | ICD-10-CM

## 2014-02-18 DIAGNOSIS — G629 Polyneuropathy, unspecified: Secondary | ICD-10-CM

## 2014-02-18 DIAGNOSIS — R0602 Shortness of breath: Secondary | ICD-10-CM | POA: Diagnosis present

## 2014-02-18 DIAGNOSIS — R4689 Other symptoms and signs involving appearance and behavior: Secondary | ICD-10-CM | POA: Insufficient documentation

## 2014-02-18 DIAGNOSIS — Z7901 Long term (current) use of anticoagulants: Secondary | ICD-10-CM

## 2014-02-18 DIAGNOSIS — J9622 Acute and chronic respiratory failure with hypercapnia: Secondary | ICD-10-CM

## 2014-02-18 DIAGNOSIS — N189 Chronic kidney disease, unspecified: Secondary | ICD-10-CM

## 2014-02-18 DIAGNOSIS — R5381 Other malaise: Secondary | ICD-10-CM | POA: Diagnosis not present

## 2014-02-18 DIAGNOSIS — G4733 Obstructive sleep apnea (adult) (pediatric): Secondary | ICD-10-CM

## 2014-02-18 DIAGNOSIS — D509 Iron deficiency anemia, unspecified: Secondary | ICD-10-CM

## 2014-02-18 DIAGNOSIS — J9601 Acute respiratory failure with hypoxia: Secondary | ICD-10-CM

## 2014-02-18 DIAGNOSIS — R918 Other nonspecific abnormal finding of lung field: Secondary | ICD-10-CM

## 2014-02-18 DIAGNOSIS — I129 Hypertensive chronic kidney disease with stage 1 through stage 4 chronic kidney disease, or unspecified chronic kidney disease: Secondary | ICD-10-CM | POA: Diagnosis present

## 2014-02-18 DIAGNOSIS — Z86711 Personal history of pulmonary embolism: Secondary | ICD-10-CM | POA: Diagnosis not present

## 2014-02-18 DIAGNOSIS — Z515 Encounter for palliative care: Secondary | ICD-10-CM

## 2014-02-18 DIAGNOSIS — J811 Chronic pulmonary edema: Secondary | ICD-10-CM

## 2014-02-18 DIAGNOSIS — R0600 Dyspnea, unspecified: Secondary | ICD-10-CM

## 2014-02-18 DIAGNOSIS — Z0289 Encounter for other administrative examinations: Secondary | ICD-10-CM

## 2014-02-18 DIAGNOSIS — J9611 Chronic respiratory failure with hypoxia: Secondary | ICD-10-CM

## 2014-02-18 DIAGNOSIS — I509 Heart failure, unspecified: Secondary | ICD-10-CM

## 2014-02-18 DIAGNOSIS — R0689 Other abnormalities of breathing: Secondary | ICD-10-CM

## 2014-02-18 DIAGNOSIS — J9612 Chronic respiratory failure with hypercapnia: Secondary | ICD-10-CM

## 2014-02-18 DIAGNOSIS — Z5181 Encounter for therapeutic drug level monitoring: Secondary | ICD-10-CM

## 2014-02-18 LAB — CBC WITH DIFFERENTIAL/PLATELET
Basophils Absolute: 0 10*3/uL (ref 0.0–0.1)
Basophils Relative: 0 % (ref 0–1)
EOS PCT: 1 % (ref 0–5)
Eosinophils Absolute: 0.1 10*3/uL (ref 0.0–0.7)
HEMATOCRIT: 39.7 % (ref 36.0–46.0)
HEMOGLOBIN: 12.8 g/dL (ref 12.0–15.0)
LYMPHS ABS: 0.8 10*3/uL (ref 0.7–4.0)
LYMPHS PCT: 5 % — AB (ref 12–46)
MCH: 29 pg (ref 26.0–34.0)
MCHC: 32.2 g/dL (ref 30.0–36.0)
MCV: 90 fL (ref 78.0–100.0)
MONOS PCT: 6 % (ref 3–12)
Monocytes Absolute: 1 10*3/uL (ref 0.1–1.0)
NEUTROS ABS: 13.9 10*3/uL — AB (ref 1.7–7.7)
Neutrophils Relative %: 88 % — ABNORMAL HIGH (ref 43–77)
Platelets: 278 10*3/uL (ref 150–400)
RBC: 4.41 MIL/uL (ref 3.87–5.11)
RDW: 16.3 % — ABNORMAL HIGH (ref 11.5–15.5)
WBC: 15.8 10*3/uL — AB (ref 4.0–10.5)

## 2014-02-18 LAB — PROTIME-INR
INR: 1.11 (ref 0.00–1.49)
Prothrombin Time: 14.3 seconds (ref 11.6–15.2)

## 2014-02-18 LAB — BASIC METABOLIC PANEL
Anion gap: 16 — ABNORMAL HIGH (ref 5–15)
BUN: 52 mg/dL — ABNORMAL HIGH (ref 6–23)
CALCIUM: 9.8 mg/dL (ref 8.4–10.5)
CHLORIDE: 90 meq/L — AB (ref 96–112)
CO2: 32 meq/L (ref 19–32)
CREATININE: 1.33 mg/dL — AB (ref 0.50–1.10)
GFR calc Af Amer: 43 mL/min — ABNORMAL LOW (ref >90)
GFR calc non Af Amer: 37 mL/min — ABNORMAL LOW (ref >90)
GLUCOSE: 124 mg/dL — AB (ref 70–99)
Potassium: 3.1 mEq/L — ABNORMAL LOW (ref 3.7–5.3)
Sodium: 138 mEq/L (ref 137–147)

## 2014-02-18 LAB — MAGNESIUM: Magnesium: 2.1 mg/dL (ref 1.5–2.5)

## 2014-02-18 LAB — I-STAT TROPONIN, ED: Troponin i, poc: 0 ng/mL (ref 0.00–0.08)

## 2014-02-18 LAB — PRO B NATRIURETIC PEPTIDE: Pro B Natriuretic peptide (BNP): 1539 pg/mL — ABNORMAL HIGH (ref 0–450)

## 2014-02-18 MED ORDER — LEVOFLOXACIN IN D5W 750 MG/150ML IV SOLN
750.0000 mg | INTRAVENOUS | Status: AC
  Administered 2014-02-18 – 2014-02-22 (×3): 750 mg via INTRAVENOUS
  Filled 2014-02-18 (×3): qty 150

## 2014-02-18 MED ORDER — ESCITALOPRAM OXALATE 20 MG PO TABS
20.0000 mg | ORAL_TABLET | Freq: Every day | ORAL | Status: DC
  Administered 2014-02-19 – 2014-03-02 (×13): 20 mg via ORAL
  Filled 2014-02-18 (×13): qty 1

## 2014-02-18 MED ORDER — POTASSIUM PHOSPHATES 15 MMOLE/5ML IV SOLN
10.0000 meq | Freq: Once | INTRAVENOUS | Status: DC
  Filled 2014-02-18: qty 2.27

## 2014-02-18 MED ORDER — FUROSEMIDE 80 MG PO TABS
80.0000 mg | ORAL_TABLET | Freq: Two times a day (BID) | ORAL | Status: DC
  Administered 2014-02-19 (×2): 80 mg via ORAL
  Filled 2014-02-18 (×5): qty 1

## 2014-02-18 MED ORDER — LEVALBUTEROL HCL 0.63 MG/3ML IN NEBU
0.6300 mg | INHALATION_SOLUTION | Freq: Four times a day (QID) | RESPIRATORY_TRACT | Status: DC
  Administered 2014-02-18 – 2014-02-20 (×6): 0.63 mg via RESPIRATORY_TRACT
  Filled 2014-02-18 (×12): qty 3

## 2014-02-18 MED ORDER — WARFARIN - PHARMACIST DOSING INPATIENT
Freq: Every day | Status: DC
  Administered 2014-02-23 – 2014-02-27 (×4)
  Administered 2014-02-28: 1
  Administered 2014-03-01: 18:00:00

## 2014-02-18 MED ORDER — IPRATROPIUM BROMIDE 0.02 % IN SOLN
0.5000 mg | RESPIRATORY_TRACT | Status: DC
  Administered 2014-02-18 – 2014-02-20 (×7): 0.5 mg via RESPIRATORY_TRACT
  Filled 2014-02-18 (×8): qty 2.5

## 2014-02-18 MED ORDER — ALLOPURINOL 100 MG PO TABS
100.0000 mg | ORAL_TABLET | Freq: Every day | ORAL | Status: DC
  Administered 2014-02-19 – 2014-03-02 (×12): 100 mg via ORAL
  Filled 2014-02-18 (×12): qty 1

## 2014-02-18 MED ORDER — LEVOFLOXACIN IN D5W 750 MG/150ML IV SOLN: 750.0000 mg | INTRAVENOUS | Status: DC

## 2014-02-18 MED ORDER — OXYCODONE HCL ER 10 MG PO T12A
10.0000 mg | EXTENDED_RELEASE_TABLET | Freq: Two times a day (BID) | ORAL | Status: DC
  Administered 2014-02-19 – 2014-02-23 (×10): 10 mg via ORAL
  Filled 2014-02-18 (×10): qty 1

## 2014-02-18 MED ORDER — GUAIFENESIN ER 600 MG PO TB12
600.0000 mg | ORAL_TABLET | Freq: Two times a day (BID) | ORAL | Status: DC
  Administered 2014-02-18 – 2014-02-23 (×10): 600 mg via ORAL
  Filled 2014-02-18 (×11): qty 1

## 2014-02-18 MED ORDER — FLECAINIDE ACETATE 100 MG PO TABS
100.0000 mg | ORAL_TABLET | Freq: Two times a day (BID) | ORAL | Status: DC
  Administered 2014-02-18 – 2014-02-27 (×19): 100 mg via ORAL
  Filled 2014-02-18 (×21): qty 1

## 2014-02-18 MED ORDER — HEPARIN (PORCINE) IN NACL 100-0.45 UNIT/ML-% IJ SOLN
1200.0000 [IU]/h | INTRAMUSCULAR | Status: DC
  Administered 2014-02-19: 1200 [IU]/h via INTRAVENOUS
  Filled 2014-02-18 (×2): qty 250

## 2014-02-18 MED ORDER — POTASSIUM CHLORIDE ER 10 MEQ PO TBCR
40.0000 meq | EXTENDED_RELEASE_TABLET | Freq: Once | ORAL | Status: AC
  Administered 2014-02-18: 40 meq via ORAL
  Filled 2014-02-18: qty 4

## 2014-02-18 MED ORDER — ATORVASTATIN CALCIUM 10 MG PO TABS
10.0000 mg | ORAL_TABLET | Freq: Every day | ORAL | Status: DC
  Administered 2014-02-19 – 2014-03-02 (×12): 10 mg via ORAL
  Filled 2014-02-18 (×12): qty 1

## 2014-02-18 MED ORDER — WARFARIN SODIUM 5 MG PO TABS
5.0000 mg | ORAL_TABLET | Freq: Once | ORAL | Status: AC
  Administered 2014-02-18: 5 mg via ORAL
  Filled 2014-02-18: qty 1

## 2014-02-18 MED ORDER — DILTIAZEM HCL ER COATED BEADS 120 MG PO CP24
120.0000 mg | ORAL_CAPSULE | Freq: Every day | ORAL | Status: DC
  Administered 2014-02-19 – 2014-03-02 (×12): 120 mg via ORAL
  Filled 2014-02-18 (×12): qty 1

## 2014-02-18 NOTE — H&P (Signed)
[facility] History and Physical  Patient: [name] [name]  ZOX:[id number]  DOB: Oct 19, 1931  DOS: the patient was seen and examined on 02/18/2014 PCP: Kerby Nora, MD  Chief Complaint: Shortness of breath hypoxia and generalized weakness  HPI: [name] [name] is a 80 y.o. female with Past medical history of chronic diastolic heart failure, obstructive sleep apnea on CPAP, history of DVT PE, chronic A. fib on anticoagulation INR goal 1.8 -2.2, A. fib, coronary artery disease, dyslipidemia, GERD, chronic lower extremity ulcers. The patient is presenting with complaints of cough and shortness of breath along with hypoxia. This has been ongoing since last one week progressively worsening. Patient on her baseline is supposed to be on 2.5 L but since last 3-4 weeks she has been increasing her oxygen to 4 L continuous to improve her shortness of breath. Since last few days it is further worsening and her oxygen saturation has been dropping high 80 despite being on 5 L of oxygen. She called her PCP who referred her to ER for further workup. She has a cough but does not have any significant sputum production no chest pain no dizziness no lightheadedness. She had nausea and an episode of vomiting but no aspiration. No acid reflux or abdominal pain. No diarrhea no active bleeding no burning urination. No recent change in her medication. Patient has been compliant with monitoring her weight and since her weight was increased day before yesterday she took extra dose of Zaroxolyn and this morning her weight was 2 pounds lower.  The patient is coming from home. And at her baseline independent for most of her ADL.  Review of Systems: as mentioned in the history of present illness.  A Comprehensive review of the other systems is negative.  Past Medical History  Diagnosis Date  . Congestive heart failure, unspecified     a. ECHO 6/0: EF 55-60%, mild LVH, mildly dilated RV w. mildly dec fx, RVSP 67  .  Chronic atrial fibrillation     failed cardioversion in past  -repeat DC-CV on October 5th, 2010  . Personal history of DVT (deep vein thrombosis) 2008  . Obstructive sleep apnea   . HTN (hypertension)   . Morbid obesity   . Shingles     with postherpetic neuraigia  . GERD (gastroesophageal reflux disease)   . Hyperlipidemia   . Meningitis 1950s  . Gallstones     s/p cholecystectomy  . CAD (coronary artery disease)     nonobstructive by cath 8/10 - LAD 40-50% w mild PAH mean 29 w PVR 3.2 [place]  . AAA (abdominal aortic aneurysm)     small  . PE (pulmonary embolism)     history of  . Anemia    Past Surgical History  Procedure Laterality Date  . Appendectomy  1947  . Tonsillectomy  1946  . Hospitalized with meningitis at [facility]  1960  . Cholecystectomy    . Cardioversion  01/2007    x2   Social History:  reports that she has never smoked. She has never used smokeless tobacco. She reports that she drinks alcohol. She reports that she does not use illicit drugs.  Allergies  Allergen Reactions  . Prednisone     Eyes swelling Pt is not sure if it was prednisone.     Family History  Problem Relation Age of Onset  . Heart failure Mother   . Depression Mother   . Ovarian cancer Paternal Grandmother   . Heart disease Paternal Grandfather   .  Obesity Other     Prior to Admission medications   Medication Sig Start Date End Date Taking? Authorizing Provider  allopurinol (ZYLOPRIM) 100 MG tablet Take 1 tablet (100 mg total) by mouth daily. 06/24/13  Yes Amy Michelle NasutiE Bedsole, MD  atorvastatin (LIPITOR) 10 MG tablet Take 1 tablet (10 mg total) by mouth daily. 01/12/14  Yes Amy Michelle NasutiE Bedsole, MD  cetirizine (ZYRTEC ALLERGY) 10 MG tablet Take 10 mg by mouth daily.     Yes Historical Provider, MD  Difluprednate (DUREZOL) 0.05 % EMUL Apply 1 drop to eye 2 (two) times daily.   Yes Historical Provider, MD  diltiazem (CARDIZEM CD) 120 MG 24 hr capsule Take 1 capsule (120 mg total) by mouth daily.  10/11/13  Yes Antonieta Ibaimothy J Gollan, MD  escitalopram (LEXAPRO) 20 MG tablet Take 1 tablet (20 mg total) by mouth daily. 11/04/13  Yes Amy Michelle NasutiE Bedsole, MD  flecainide (TAMBOCOR) 100 MG tablet Take 1 tablet (100 mg total) by mouth every 12 (twelve) hours. 01/25/14  Yes Antonieta Ibaimothy J Gollan, MD  furosemide (LASIX) 80 MG tablet Take 1 tablet (80 mg total) by mouth 2 (two) times daily. 12/07/13  Yes Antonieta Ibaimothy J Gollan, MD  ipratropium (ATROVENT HFA) 17 MCG/ACT inhaler Inhale 2 puffs into the lungs 4 (four) times daily. 07/07/13  Yes Amy Michelle NasutiE Bedsole, MD  metolazone (ZAROXOLYN) 2.5 MG tablet Take 2.5 mg by mouth daily as needed. Only take with increased weight. 11/19/13 11/19/14 Yes Antonieta Ibaimothy J Gollan, MD  NON FORMULARY Oxygen 2.5 liters daily   Yes Historical Provider, MD  OxyCODONE (OXYCONTIN) 10 mg T12A 12 hr tablet Take 1 tablet (10 mg total) by mouth every 12 (twelve) hours. 12/23/13  Yes Amy Michelle NasutiE Bedsole, MD  potassium chloride (K-DUR) 10 MEQ tablet Take 1 tablet (10 mEq total) by mouth daily. 01/26/14  Yes Antonieta Ibaimothy J Gollan, MD  warfarin (COUMADIN) 2.5 MG tablet Take 1.25-2.5 mg by mouth See admin instructions. 1/2 tablet only on Monday and 1 tablet all the rest of the days   Yes Historical Provider, MD  colchicine 0.6 MG tablet Take 0.6-1.2 mg by mouth daily as needed (for gout flare up).    Historical Provider, MD    Physical Exam: Filed Vitals:   02/18/14 2000 02/18/14 2015 02/18/14 2030 02/18/14 2045  BP: 136/66 123/57 111/59 124/51  Pulse: 73 73 73 77  Temp:      TempSrc:      Resp: 26 27 29  32  Height:      Weight:      SpO2: 90% 89% 91% 91%    General: Alert, Awake and Oriented to Time, Place and Person. Appear in moderate distress Eyes: PERRL ENT: Oral Mucosa clear moist. Neck: Difficult to assess JVD Cardiovascular: S1 and S2 Present, aortic systolic Murmur, Peripheral Pulses Present Respiratory: Bilateral Air entry equal and Decreased, bilateral basal Crackles, bilateral expiratory wheezes Abdomen: Bowel  Sound present, Soft and non- tender Skin: No Rash, slow healing wound ulcer on the right foot Extremities: Bilateral Pedal edema, no calf tenderness Neurologic: Grossly no focal neuro deficit.  Labs on Admission:  CBC:  Recent Labs Lab 02/18/14 1609  WBC 15.8*  NEUTROABS 13.9*  HGB 12.8  HCT 39.7  MCV 90.0  PLT 278    CMP     Component Value Date/Time   NA 138 02/18/2014 1609   NA 135 12/28/2013 1457   K 3.1* 02/18/2014 1609   CL 90* 02/18/2014 1609   CO2 32 02/18/2014 1609  GLUCOSE 124* 02/18/2014 1609   GLUCOSE 91 12/28/2013 1457   BUN 52* 02/18/2014 1609   BUN 36* 12/28/2013 1457   CREATININE 1.33* 02/18/2014 1609   CREATININE 1.65* 09/17/2013 1646   CALCIUM 9.8 02/18/2014 1609   PROT 7.4 09/06/2013 0528   ALBUMIN 3.3* 12/23/2013 1421   AST 30 09/06/2013 0528   ALT 57* 09/06/2013 0528   ALKPHOS 99 09/06/2013 0528   BILITOT 0.3 09/06/2013 0528   GFRNONAA 37* 02/18/2014 1609   GFRAA 43* 02/18/2014 1609    No results found for this basename: LIPASE, AMYLASE,  in the last 168 hours No results found for this basename: AMMONIA,  in the last 168 hours  No results found for this basename: CKTOTAL, CKMB, CKMBINDEX, TROPONINI,  in the last 168 hours BNP (last 3 results)  Recent Labs  08/31/13 1631 02/18/14 1609  PROBNP 16475.0* 1539.0*    Radiological Exams on Admission: Dg Chest 2 View  02/18/2014   CLINICAL DATA:  Shortness of breath 1 week. Patient on home oxygen for COPD.  EXAM: CHEST  2 VIEW  COMPARISON:  08/31/2013 and 01/19/2013  FINDINGS: Exam was performed with patient in the sitting position. Patient is rotated to the right. Lungs are hypoinflated with mild prominence of the perihilar markings. There is mild elevation the right hemidiaphragm with scalloping of the right diaphragm unchanged. Possible mild bibasilar opacification which may be due to atelectasis or infection. Stable cardiomegaly. Mild biapical pleural thickening. Degenerative changes of the spine with mild  curvature of the thoracic spine convex to the left.  IMPRESSION: Hypoinflation with mild bibasilar opacification which may be due to atelectasis or infection.  Mild cardiomegaly with findings suggesting mild vascular congestion.   Electronically Signed   By: Elberta Fortisaniel  Boyle M.D.   On: 02/18/2014 17:14    EKG: Independently reviewed. normal sinus rhythm, nonspecific ST and T waves changes.  Assessment/Plan Principal Problem:   CAP (community acquired pneumonia) Active Problems:   Essential hypertension   Atrial fibrillation   DIASTOLIC HEART FAILURE, CHRONIC   Obstructive sleep apnea   Chronic hypoxemic respiratory failure   CKD (chronic kidney disease) stage 3, GFR 30-59 ml/min   Hypokalemia   Hx pulmonary embolism   1. CAP (community acquired pneumonia) Acute on chronic hypoxic respiratory failure  The patient is presenting with complaints of progressively worsening hypoxia with cough and shortness of breath. Chest x-ray shows bilateral probable basal infiltrate. She has elevated leukocytes and oxygen requirement. With this there is a high likelihood of patient having pneumonia. At present I would obtain blood cultures sputum culture urine antigens and treat the patient with levofloxacin for community-acquired pneumonia. I would also use BiPAP as needed. CPAP will be continued each bedtime. I would also use Mucinex and flutter device for pulmonary toilet. Use incentive spirometry as needed.   2. Chronic diastolic heart failure. Patient has no clinical evidence of volume overload at present. Currently I would continue her Lasix and hold Zaroxolyn and monitor her daily ins and outs as well as daily weight place her on fluid restricted diet.  3. History of A. fib History of PE Patient's INR is subtherapeutic. I would start her on IV heparin and continue her with warfarin for bridging. Obtain lower extremity Doppler in the morning.  4. Hypokalemia Oral potassium  Advance goals  of care discussion: Full code as per my discussion with patient   DVT Prophylaxis:on chronic anticoagulation Nutrition: Cardiac diet  Family Communication: Family was present at bedside, opportunity was  given to ask question and all questions were answered satisfactorily at the time of interview. Disposition: Admitted to inpatient in step-down unit.  Author: Lynden Oxford, MD [facility] Pager: [phone] 02/18/2014, 8:57 PM    If 7PM-7AM, please contact night-coverage [email or web address] Password [email or web address]

## 2014-02-18 NOTE — ED Provider Notes (Signed)
CSN: [id number]     Arrival date & time 02/18/14  1603 History   First MD Initiated Contact with Patient 02/18/14 1607     Chief Complaint  Patient presents with  . Shortness of Breath   Patient is a 80 y.o. female presenting with shortness of breath. The history is provided by the patient.  Shortness of Breath Severity:  Severe Onset quality:  Gradual Timing:  Constant Progression:  Worsening Chronicity:  Recurrent Relieved by:  Nothing Ineffective treatments:  Oxygen Associated symptoms: cough   Associated symptoms: no abdominal pain, no chest pain, no fever, no headaches, no neck pain, no rash, no sore throat and no vomiting   Risk factors: hx of PE/DVT    Patient is an 80 year old Caucasian female with history of CHF (EF 55-60%), A. fib, PE/DVT on Coumadin, CAD who presents with shortness of breath. Patient wears 3 L of oxygen at home and reports worsening dyspnea at rest. Patient denies leg swelling or chest pain. Patient also reports a cough productive of green sputum but denies hemoptysis. Past Medical History  Diagnosis Date  . Congestive heart failure, unspecified     a. ECHO 6/0: EF 55-60%, mild LVH, mildly dilated RV w. mildly dec fx, RVSP 67  . Chronic atrial fibrillation     failed cardioversion in past  -repeat DC-CV on October 5th, 2010  . Personal history of DVT (deep vein thrombosis) 2008  . Obstructive sleep apnea   . HTN (hypertension)   . Morbid obesity   . Shingles     with postherpetic neuraigia  . GERD (gastroesophageal reflux disease)   . Hyperlipidemia   . Meningitis 1950s  . Gallstones     s/p cholecystectomy  . CAD (coronary artery disease)     nonobstructive by cath 8/10 - LAD 40-50% w mild PAH mean 29 w PVR 3.2 [place]  . AAA (abdominal aortic aneurysm)     small  . PE (pulmonary embolism)     history of  . Anemia    Past Surgical History  Procedure Laterality Date  . Appendectomy  1947  . Tonsillectomy  1946  . Hospitalized with meningitis  at [facility]  1960  . Cholecystectomy    . Cardioversion  01/2007    x2   Family History  Problem Relation Age of Onset  . Heart failure Mother   . Depression Mother   . Ovarian cancer Paternal Grandmother   . Heart disease Paternal Grandfather   . Obesity Other    History  Substance Use Topics  . Smoking status: Never Smoker   . Smokeless tobacco: Never Used  . Alcohol Use: Yes     Comment: occasional   OB History   Grav Para Term Preterm Abortions TAB SAB Ect Mult Living                 Review of Systems  Constitutional: Negative for fever.  HENT: Negative for rhinorrhea and sore throat.   Eyes: Negative for visual disturbance.  Respiratory: Positive for cough and shortness of breath. Negative for chest tightness.   Cardiovascular: Negative for chest pain, palpitations and leg swelling.  Gastrointestinal: Negative for nausea, vomiting, abdominal pain and constipation.  Genitourinary: Negative for dysuria and hematuria.  Musculoskeletal: Negative for back pain and neck pain.  Skin: Negative for rash.  Neurological: Negative for dizziness and headaches.  Psychiatric/Behavioral: Negative for confusion.  All other systems reviewed and are negative.  Allergies  Prednisone  Home Medications  Prior to Admission medications   Medication Sig Start Date End Date Taking? Authorizing Provider  allopurinol (ZYLOPRIM) 100 MG tablet Take 1 tablet (100 mg total) by mouth daily. 06/24/13  Yes Amy Michelle NasutiE Bedsole, MD  atorvastatin (LIPITOR) 10 MG tablet Take 1 tablet (10 mg total) by mouth daily. 01/12/14  Yes Amy Michelle NasutiE Bedsole, MD  cetirizine (ZYRTEC ALLERGY) 10 MG tablet Take 10 mg by mouth daily.     Yes Historical Provider, MD  Difluprednate (DUREZOL) 0.05 % EMUL Apply 1 drop to eye 2 (two) times daily.   Yes Historical Provider, MD  diltiazem (CARDIZEM CD) 120 MG 24 hr capsule Take 1 capsule (120 mg total) by mouth daily. 10/11/13  Yes Antonieta Ibaimothy J Gollan, MD  escitalopram (LEXAPRO) 20 MG  tablet Take 1 tablet (20 mg total) by mouth daily. 11/04/13  Yes Amy Michelle NasutiE Bedsole, MD  flecainide (TAMBOCOR) 100 MG tablet Take 1 tablet (100 mg total) by mouth every 12 (twelve) hours. 01/25/14  Yes Antonieta Ibaimothy J Gollan, MD  furosemide (LASIX) 80 MG tablet Take 1 tablet (80 mg total) by mouth 2 (two) times daily. 12/07/13  Yes Antonieta Ibaimothy J Gollan, MD  ipratropium (ATROVENT HFA) 17 MCG/ACT inhaler Inhale 2 puffs into the lungs 4 (four) times daily. 07/07/13  Yes Amy Michelle NasutiE Bedsole, MD  metolazone (ZAROXOLYN) 2.5 MG tablet Take 2.5 mg by mouth daily as needed. Only take with increased weight. 11/19/13 11/19/14 Yes Antonieta Ibaimothy J Gollan, MD  NON FORMULARY Oxygen 2.5 liters daily   Yes Historical Provider, MD  OxyCODONE (OXYCONTIN) 10 mg T12A 12 hr tablet Take 1 tablet (10 mg total) by mouth every 12 (twelve) hours. 12/23/13  Yes Amy Michelle NasutiE Bedsole, MD  potassium chloride (K-DUR) 10 MEQ tablet Take 1 tablet (10 mEq total) by mouth daily. 01/26/14  Yes Antonieta Ibaimothy J Gollan, MD  warfarin (COUMADIN) 2.5 MG tablet Take 1.25-2.5 mg by mouth See admin instructions. 1/2 tablet only on Monday and 1 tablet all the rest of the days   Yes Historical Provider, MD  colchicine 0.6 MG tablet Take 0.6-1.2 mg by mouth daily as needed (for gout flare up).    Historical Provider, MD   BP 124/43  Pulse 75  Temp(Src) 99.7 F (37.6 C) (Axillary)  Resp 30  Ht 5\' 5"  (1.651 m)  Wt 266 lb 12.1 oz (121 kg)  BMI 44.39 kg/m2  SpO2 91% Physical Exam  Constitutional: She is oriented to person, place, and time. She appears well-developed and well-nourished. No distress.  Obese white female, conversationally dyspneic, chronically ill appearing  HENT:  Head: Normocephalic and atraumatic.  Mouth/Throat: Oropharynx is clear and moist.  Eyes: EOM are normal. Pupils are equal, round, and reactive to light.  Neck: Neck supple. No JVD present.  Cardiovascular: Normal rate, regular rhythm, normal heart sounds and intact distal pulses.  Exam reveals no gallop.   No  murmur heard. Pulmonary/Chest: Effort normal. She has wheezes. She has rales.  Abdominal: Soft. She exhibits no distension. There is no tenderness.  Musculoskeletal: Normal range of motion. She exhibits no tenderness.  Neurological: She is alert and oriented to person, place, and time. No cranial nerve deficit. She exhibits normal muscle tone.  Skin: Skin is warm and dry. No rash noted.  Psychiatric: Her behavior is normal.    ED Course  Procedures  None  Labs Review Labs Reviewed  CBC WITH DIFFERENTIAL - Abnormal; Notable for the following:    WBC 15.8 (*)    RDW 16.3 (*)  Neutrophils Relative % 88 (*)    Neutro Abs 13.9 (*)    Lymphocytes Relative 5 (*)    All other components within normal limits  BASIC METABOLIC PANEL - Abnormal; Notable for the following:    Potassium 3.1 (*)    Chloride 90 (*)    Glucose, Bld 124 (*)    BUN 52 (*)    Creatinine, Ser 1.33 (*)    GFR calc non Af Amer 37 (*)    GFR calc Af Amer 43 (*)    Anion gap 16 (*)    All other components within normal limits  PRO B NATRIURETIC PEPTIDE - Abnormal; Notable for the following:    Pro B Natriuretic peptide (BNP) 1539.0 (*)    All other components within normal limits  HEPARIN LEVEL (UNFRACTIONATED) - Abnormal; Notable for the following:    Heparin Unfractionated <0.10 (*)    All other components within normal limits  CBC - Abnormal; Notable for the following:    WBC 15.1 (*)    Hemoglobin 11.4 (*)    HCT 35.8 (*)    RDW 16.6 (*)    All other components within normal limits  PROTIME-INR - Abnormal; Notable for the following:    Prothrombin Time 16.5 (*)    All other components within normal limits    Imaging Review Dg Chest 2 View  02/18/2014   CLINICAL DATA:  Shortness of breath 1 week. Patient on home oxygen for COPD.  EXAM: CHEST  2 VIEW  COMPARISON:  08/31/2013 and 01/19/2013  FINDINGS: Exam was performed with patient in the sitting position. Patient is rotated to the right. Lungs are  hypoinflated with mild prominence of the perihilar markings. There is mild elevation the right hemidiaphragm with scalloping of the right diaphragm unchanged. Possible mild bibasilar opacification which may be due to atelectasis or infection. Stable cardiomegaly. Mild biapical pleural thickening. Degenerative changes of the spine with mild curvature of the thoracic spine convex to the left.  IMPRESSION: Hypoinflation with mild bibasilar opacification which may be due to atelectasis or infection.  Mild cardiomegaly with findings suggesting mild vascular congestion.   Electronically Signed   By: Elberta Fortis M.D.   On: 02/18/2014 17:14     EKG Interpretation   Date/Time:  Friday February 18 2014 16:15:21 EDT Ventricular Rate:  77 PR Interval:  184 QRS Duration: 109 QT Interval:  390 QTC Calculation: 441 R Axis:   -21 Text Interpretation:  Sinus rhythm Borderline left axis deviation Abnormal  R-wave progression, early transition Borderline repolarization abnormality  Confirmed by BEATON  MD, ROBERT ([id number]) on 02/18/2014 4:39:22 PM      MDM   Final diagnoses:  CHF exacerbation    80 year old female with history of CHF, DVT PE, chronic A. fib on Coumadin who presents with one week of shortness of breath and productive cough. Lungs sounds wet. Requiring 5L O2 [place] to maintain sats in 90s (wears 3L at home). Suspect CHF exac. WBC 15, no fever. Patient had leukocytosis from last CHF exac with no documented PNA so will hold ABX at this time. Creatinine elevated. Will admit for CHF exac. Patient will go to stepdown unit. Stable for transport.  Case discussed with Dr. Radford Pax.    Maris Berger, MD 02/19/14 1040

## 2014-02-18 NOTE — Telephone Encounter (Signed)
Spoke w/ Toni Amendourtney.  She reports that pt is currently on 5L of O2 via [place], SpO2 of 84%. Reports that pt is having trouble breathing, gasping, esp w/ exertion to go to the BR.  After sitting and taking deep breaths, SpO2 will reach 90%. Reports that her wt is down 2 lbs from yesterday (269 to 267). States that she called pulm office yesterday, as pt's SpO2 was in the 60s.  Pt was advised at that to call 911, but she refused.  Milinda Pointerdvised Courtney that pt needs immediate attention and to hang up and call 911 now.  She states that pt does not want to go anywhere and wants an explanation as to why she needs to be seen.  Milinda Pointerdvised Courtney of the need to seek immediate attention and not to delay. After some hesitation, Toni AmendCourtney verbalizes agreement and states that she will call 911.

## 2014-02-18 NOTE — Progress Notes (Signed)
Per request for pulse oximetry while ambulating, patient assisted to bedside commode on 6L [place], sats down to 83% on return to bed with exertional dyspnea.  Fairly quick recovery to low 90's.

## 2014-02-18 NOTE — ED Notes (Signed)
Admitting MD remains at bedside.

## 2014-02-18 NOTE — Telephone Encounter (Signed)
[name]

## 2014-02-18 NOTE — Telephone Encounter (Signed)
Paramedics called from pt's home stating that pt is refusing services, as she feels that she just needs to be worked in to be seen today.  Reports pt's SpO2 is 86-87% on 5L O2. Advised her that pt needs immediate attention and we have no openings.  She states that pt feels that she "has an infection and just needs an antibiotic".  She states that she asked for pt's PCP #, but family dialed our number.  Advised them that pt needs her sx to be addressed now.   Gave them # to pt's PCP office, but strongly encouraged them to take pt to nearest hospital.

## 2014-02-18 NOTE — Telephone Encounter (Signed)
Noted  

## 2014-02-18 NOTE — Telephone Encounter (Signed)
Courtney (Caregiver) notified per [name] that Ms. [name] [name] needs to be evaluated at the ER ASAP.  Toni AmendCourtney states Ms. [name] [name] has refused everybody about going to the ER.  She just wants someone to prescribe antibiotics for her.  Advised per [name] with her SpO2 being 86-87% on 5L O2, this is something that can not be handled at a office setting and I again emphasized again that Ms. [name] [name] needs to be taken to the ER ASAP.  Toni AmendCourtney states she will let Ms. [name] [name] know but she doesn't think she will go.

## 2014-02-18 NOTE — Telephone Encounter (Signed)
Toni AmendCourtney, caretaker, called states the last week pt has had trouble breathing, O2 level in 60s. Is gasping to catch her breath.

## 2014-02-18 NOTE — ED Notes (Signed)
Lab at bedside to obtain blood cultures.

## 2014-02-18 NOTE — Progress Notes (Addendum)
Have reviewed the patient and agree with the student's note as written.  Noted patient's outpatient goal for INR is 1.8-2.2 and that has been continued by admitting physician. Will not bolus heparin as per physician request as well.  Heparin drip, warfarin and labs ordered.   MD also asked pharmacy to renally adjust antibiotics. Levofloxacin was changed to 750mg  IV q48h x5 days of therapy.  Jaye Saal D. Juanisha Bautch, PharmD, BCPS Clinical Pharmacist Pager: [phone] 02/18/2014 9:23 PM

## 2014-02-18 NOTE — ED Notes (Addendum)
Per EMS- Pt comes from home, SOB for 1 week worsening, at baseline COPD on 3 L oxygen. EMS called out to home earlier today, pt didn't want to come, was 80% 3 L. Today is on 6 L at 88%, SOB at rest. Diminished lung sounds. BP 110/60, HR 70, CBG 166. Pt was given 2-duonebs.

## 2014-02-18 NOTE — Telephone Encounter (Signed)
Patient Information:  Caller Name: [name]  Phone: [phone]  Patient: [name] [name], [name] [name]  Gender: Female  DOB: 11/30/1931  Age: [age] Years  PCP: Other  Office Follow Up:  Does the office need to follow up with this patient?: No  Instructions For The Office: N/A  RN Note:  Appt scheduled for 1630 with Dr Quintella ReichertHooper, [facility] office, pt agreed to office visit.  Symptoms  Reason For Call & Symptoms: Lorene DyChristie with EMS calling to advise pt refuses to go to ED but will consent to go to the office.  [facility] not [facility].   Per EMS pt continuing to have chest congestion, low O2 sats, SOB.  Reviewed Health History In EMR: Yes  Reviewed Medications In EMR: Yes  Reviewed Allergies In EMR: Yes  Reviewed Surgeries / Procedures: Yes  Date of Onset of Symptoms: 02/18/2014  Guideline(s) Used:  Breathing Difficulty  Disposition Per Guideline:   Call EMS 911 Now  Reason For Disposition Reached:   Severe difficulty breathing (e.g., struggling for each breath, speaks in single words, pulse > 120)  Advice Given:  N/A  Patient Will Follow Care Advice:  YES

## 2014-02-18 NOTE — Progress Notes (Signed)
ANTICOAGULATION CONSULT NOTE - Initial Consult  Pharmacy Consult for heparin/warfarin Indication: atrial fibrillation  Allergies  Allergen Reactions  . Prednisone     Eyes swelling Pt is not sure if it was prednisone.     Patient Measurements: Height: 5\' 4"  (162.6 cm) Weight: 267 lb (121.11 kg) IBW/kg (Calculated) : 54.7 Heparin Dosing Weight: 84.2 kg  Vital Signs: Temp: 99.6 F (37.6 C) (10/09 1617) Temp Source: Oral (10/09 1617) BP: 124/51 mmHg (10/09 2045) Pulse Rate: 77 (10/09 2045)  Labs:  Recent Labs  02/18/14 1609 02/18/14 1641  HGB 12.8  --   HCT 39.7  --   PLT 278  --   LABPROT  --  14.3  INR  --  1.11  CREATININE 1.33*  --     Estimated Creatinine Clearance: 43.3 ml/min (by C-G formula based on Cr of 1.33).   Medical History: Past Medical History  Diagnosis Date  . Congestive heart failure, unspecified     a. ECHO 6/0: EF 55-60%, mild LVH, mildly dilated RV w. mildly dec fx, RVSP 67  . Chronic atrial fibrillation     failed cardioversion in past  -repeat DC-CV on October 5th, 2010  . Personal history of DVT (deep vein thrombosis) 2008  . Obstructive sleep apnea   . HTN (hypertension)   . Morbid obesity   . Shingles     with postherpetic neuraigia  . GERD (gastroesophageal reflux disease)   . Hyperlipidemia   . Meningitis 1950s  . Gallstones     s/p cholecystectomy  . CAD (coronary artery disease)     nonobstructive by cath 8/10 - LAD 40-50% w mild PAH mean 29 w PVR 3.2 [place]  . AAA (abdominal aortic aneurysm)     small  . PE (pulmonary embolism)     history of  . Anemia     Medications:  PTA Warfarin 1.25 mg on Monday, 2.5 mg on all other days.   Assessment: [name] w/ SOB sating 86% on 5L O2. PTA warfarin for atrial fibrillation. BP 124/51, WBC 15.8, INR 1.11.  Goal of Therapy:  Goal INR 1.8-2.2 Goal heparin level 0.3 - 0.7 Monitor platelets by anticoagulation protocol: Yes   Plan:  Heparin 1200 units/hr bridge until  therapeutic INR Warfarin 5 mg F/u daily heparin level, INR, CBC, 8 hour heparin level   Marisue BrooklynGazda, Mahina Salatino 02/18/2014,9:02 PM

## 2014-02-18 NOTE — Telephone Encounter (Signed)
Please let pt know if she is hypoxic despite 5l of oxygen, this is NOT something we can handle in the office or overtime.  She needs to go to ER ASAP.

## 2014-02-18 NOTE — Telephone Encounter (Signed)
I do not think it is a good idea to have her wait until later today to be seen. Should go to ER now.

## 2014-02-19 LAB — COMPREHENSIVE METABOLIC PANEL
ALBUMIN: 2.6 g/dL — AB (ref 3.5–5.2)
ALK PHOS: 107 U/L (ref 39–117)
ALT: 17 U/L (ref 0–35)
ANION GAP: 17 — AB (ref 5–15)
AST: 18 U/L (ref 0–37)
BUN: 53 mg/dL — ABNORMAL HIGH (ref 6–23)
CHLORIDE: 91 meq/L — AB (ref 96–112)
CO2: 31 mEq/L (ref 19–32)
CREATININE: 1.4 mg/dL — AB (ref 0.50–1.10)
Calcium: 9.5 mg/dL (ref 8.4–10.5)
GFR calc Af Amer: 40 mL/min — ABNORMAL LOW (ref >90)
GFR calc non Af Amer: 34 mL/min — ABNORMAL LOW (ref >90)
Glucose, Bld: 103 mg/dL — ABNORMAL HIGH (ref 70–99)
POTASSIUM: 3.4 meq/L — AB (ref 3.7–5.3)
Sodium: 139 mEq/L (ref 137–147)
TOTAL PROTEIN: 7.6 g/dL (ref 6.0–8.3)
Total Bilirubin: 0.4 mg/dL (ref 0.3–1.2)

## 2014-02-19 LAB — HEPARIN LEVEL (UNFRACTIONATED): Heparin Unfractionated: <0.1 IU/mL — ABNORMAL LOW (ref 0.30–0.70)

## 2014-02-19 LAB — LEGIONELLA ANTIGEN, URINE

## 2014-02-19 LAB — MRSA PCR SCREENING: MRSA BY PCR: NEGATIVE

## 2014-02-19 LAB — CBC
HEMATOCRIT: 35.8 % — AB (ref 36.0–46.0)
HEMOGLOBIN: 11.4 g/dL — AB (ref 12.0–15.0)
MCH: 28.7 pg (ref 26.0–34.0)
MCHC: 31.8 g/dL (ref 30.0–36.0)
MCV: 90.2 fL (ref 78.0–100.0)
Platelets: 301 10*3/uL (ref 150–400)
RBC: 3.97 MIL/uL (ref 3.87–5.11)
RDW: 16.6 % — ABNORMAL HIGH (ref 11.5–15.5)
WBC: 15.1 10*3/uL — AB (ref 4.0–10.5)

## 2014-02-19 LAB — STREP PNEUMONIAE URINARY ANTIGEN: STREP PNEUMO URINARY ANTIGEN: NEGATIVE

## 2014-02-19 LAB — PROTIME-INR
INR: 1.33 (ref 0.00–1.49)
Prothrombin Time: 16.5 seconds — ABNORMAL HIGH (ref 11.6–15.2)

## 2014-02-19 MED ORDER — WARFARIN SODIUM 3 MG PO TABS
3.0000 mg | ORAL_TABLET | Freq: Once | ORAL | Status: AC
  Administered 2014-02-19: 3 mg via ORAL
  Filled 2014-02-19: qty 1

## 2014-02-19 NOTE — Progress Notes (Signed)
[facility] PROGRESS NOTE  Aneta Minseggy T Arciga WGN:[id number] DOB: 03/20/1932 DOA: 02/18/2014 PCP: [name], MD Brief narrative 80 y.o. female with Past medical history of chronic diastolic heart failure, obstructive sleep apnea on CPAP, on chr home o2 ( 2.5L [place] ) history of DVT& PE in 2008, chronic A. fib on coumadin , coronary artery disease, dyslipidemia, GERD, chronic lower extremity ulcers presenting with complaints of cough and shortness of breath along with hypoxia. Patient progressively short of breath for past 3-4 weeks.  Patient admitted to SDU with acute on chr hypoxic resp failure secondary to CAP  Assessment/Plan: Acute on chr hypoxic resp failure  secondary to CAP. possble b/l infiltrate on CXR.  empiric abx. Follow blood cx, urine strep and legionella. Sputum cx. On 6L o2 via [place]. continue nebs and antitussives   Chronic diastolic heart failure.  Euvolemic on exam  continue her Lasix and hold Zaroxolyn . Monitor I/O and daily wts  History of A. fib and PE  INR is subtherapeutic. PE in 2008. Currently in sinus.doesnot need heparin bridge. Will d/c IV heparin. coumadin dose per pharmacy continue Cardizem and flecainide   Dyslipidemia  cont statin     Hypokalemia  replenish  Advance goals of care discussion: Full code as per my discussion with patient  DVT Prophylaxis:on chronic anticoagulation  Nutrition: Cardiac diet   Code Status: FULL CODE Family Communication: none at bedside Disposition Plan: monitor in SDU   Consultants:  none  Procedures:  none  Antibiotics:  Levaquin ( 10/9--)  HPI/Subjective: Pt on CPAP, transitioned to [place] 6L and sats maintained >92%. Denies CP  Or worsening dyspnea.  Objective: Filed Vitals:   02/19/14 0735  BP: 125/52  Pulse: 75  Temp: 99.7 F (37.6 C)  Resp: 30    Intake/Output Summary (Last 24 hours) at 02/19/14 0843 Last data filed at 02/19/14 0834  Gross per 24 hour  Intake    205 ml  Output    750 ml   Net   -545 ml   Filed Weights   02/18/14 1616 02/18/14 2207 02/19/14 0600  Weight: 121.11 kg (267 lb) 120.6 kg (265 lb 14 oz) 121 kg (266 lb 12.1 oz)    Exam:   General:  Elderly obese female in NAD   HEENT: moist oral mucosa   chest: diminished breath sounds b/l, no added sounds   CVS: NS1&S2, no murmurs  Abd: soft, NT, ND, BS+  Ext: chr skin graft b/l, no edema   CNS: alert and oriented   Data Reviewed: Basic Metabolic Panel:  Recent Labs Lab 02/18/14 1609 02/18/14 2128 02/19/14 0224  NA 138  --  139  K 3.1*  --  3.4*  CL 90*  --  91*  CO2 32  --  31  GLUCOSE 124*  --  103*  BUN 52*  --  53*  CREATININE 1.33*  --  1.40*  CALCIUM 9.8  --  9.5  MG  --  2.1  --    Liver Function Tests:  Recent Labs Lab 02/19/14 0224  AST 18  ALT 17  ALKPHOS 107  BILITOT 0.4  PROT 7.6  ALBUMIN 2.6*   No results found for this basename: LIPASE, AMYLASE,  in the last 168 hours No results found for this basename: AMMONIA,  in the last 168 hours CBC:  Recent Labs Lab 02/18/14 1609 02/19/14 0224  WBC 15.8* 15.1*  NEUTROABS 13.9*  --   HGB 12.8 11.4*  HCT 39.7 35.8*  MCV  90.0 90.2  PLT 278 301   Cardiac Enzymes: No results found for this basename: CKTOTAL, CKMB, CKMBINDEX, TROPONINI,  in the last 168 hours BNP (last 3 results)  Recent Labs  08/31/13 1631 02/18/14 1609  PROBNP 16475.0* 1539.0*   CBG: No results found for this basename: GLUCAP,  in the last 168 hours  Recent Results (from the past 240 hour(s))  MRSA PCR SCREENING     Status: None   Collection Time    02/18/14 10:34 PM      Result Value Ref Range Status   MRSA by PCR NEGATIVE  NEGATIVE Final   Comment:            The GeneXpert MRSA Assay (FDA     approved for NASAL specimens     only), is one component of a     comprehensive MRSA colonization     surveillance program. It is not     intended to diagnose MRSA     infection nor to guide or     monitor treatment for     MRSA  infections.     Studies: Dg Chest 2 View  02/18/2014   CLINICAL DATA:  Shortness of breath 1 week. Patient on home oxygen for COPD.  EXAM: CHEST  2 VIEW  COMPARISON:  08/31/2013 and 01/19/2013  FINDINGS: Exam was performed with patient in the sitting position. Patient is rotated to the right. Lungs are hypoinflated with mild prominence of the perihilar markings. There is mild elevation the right hemidiaphragm with scalloping of the right diaphragm unchanged. Possible mild bibasilar opacification which may be due to atelectasis or infection. Stable cardiomegaly. Mild biapical pleural thickening. Degenerative changes of the spine with mild curvature of the thoracic spine convex to the left.  IMPRESSION: Hypoinflation with mild bibasilar opacification which may be due to atelectasis or infection.  Mild cardiomegaly with findings suggesting mild vascular congestion.   Electronically Signed   By: Elberta Fortisaniel  Boyle M.D.   On: 02/18/2014 17:14    Scheduled Meds: . allopurinol  100 mg Oral Daily  . atorvastatin  10 mg Oral Daily  . diltiazem  120 mg Oral Daily  . escitalopram  20 mg Oral Daily  . flecainide  100 mg Oral Q12H  . furosemide  80 mg Oral BID  . guaiFENesin  600 mg Oral BID  . ipratropium  0.5 mg Nebulization Q4H  . levalbuterol  0.63 mg Nebulization Q6H  . levofloxacin (LEVAQUIN) IV  750 mg Intravenous Q48H  . OxyCODONE  10 mg Oral Q12H  . Warfarin - Pharmacist Dosing Inpatient   Does not apply q1800   Continuous Infusions: . heparin 1,200 Units/hr (02/19/14 0125)      Time spent: 35 minutes    Dorella Laster  [facility] Pager [phone] If 7PM-7AM, please contact night-coverage at [email or web address], password [email or web address] 02/19/2014, 8:43 AM  LOS: 1 day

## 2014-02-19 NOTE — Progress Notes (Signed)
Pt placed on cpap due to  Sleeping and desating

## 2014-02-19 NOTE — ED Provider Notes (Signed)
I saw and evaluated the patient, reviewed the resident's note and I agree with the findings and plan.   .Face to face Exam:  General:  Awake HEENT:  Atraumatic Resp:  Wheezes and rales bilaterally Abd:  Nondistended Neuro:No focal weakness Lymph: No adenopathy   EKG is discussed and reviewed with resident  CRITICAL CARE Performed by: Nelva NayBEATON,Zyon Grout L Total critical care time: 30 min Critical care time was exclusive of separately billable procedures and treating other patients. Critical care was necessary to treat or prevent imminent or life-threatening deterioration. Critical care was time spent personally by me on the following activities: development of treatment plan with patient and/or surrogate as well as nursing, discussions with consultants, evaluation of patient's response to treatment, examination of patient, obtaining history from patient or surrogate, ordering and performing treatments and interventions, ordering and review of laboratory studies, ordering and review of radiographic studies, pulse oximetry and re-evaluation of patient's condition.   Nelia Shiobert L Koya Hunger, MD 02/19/14 [phone]

## 2014-02-19 NOTE — Progress Notes (Signed)
Pt taken off cpap and placed on 6L [place]

## 2014-02-19 NOTE — Progress Notes (Signed)
ANTICOAGULATION CONSULT NOTE   Pharmacy Consult for heparin/warfarin Indication: history of PE/ atrial fibrillation  Allergies  Allergen Reactions  . Prednisone     Eyes swelling Pt is not sure if it was prednisone.     Patient Measurements: Height: 5\' 5"  (165.1 cm) Weight: 266 lb 12.1 oz (121 kg) IBW/kg (Calculated) : 57 Heparin Dosing Weight: 84.2 kg  Vital Signs: Temp: 99.7 F (37.6 C) (10/10 0735) Temp Source: Axillary (10/10 0735) BP: 124/43 mmHg (10/10 0954) Pulse Rate: 75 (10/10 0735)  Labs:  Recent Labs  02/18/14 1609 02/18/14 1641 02/19/14 0224 02/19/14 0935  HGB 12.8  --  11.4*  --   HCT 39.7  --  35.8*  --   PLT 278  --  301  --   LABPROT  --  14.3 16.5*  --   INR  --  1.11 1.33  --   HEPARINUNFRC  --   --  <0.10* <0.10*  CREATININE 1.33*  --  1.40*  --     Estimated Creatinine Clearance: 41.8 ml/min (by C-G formula based on Cr of 1.4).   Medical History: Past Medical History  Diagnosis Date  . Congestive heart failure, unspecified     a. ECHO 6/0: EF 55-60%, mild LVH, mildly dilated RV w. mildly dec fx, RVSP 67  . Chronic atrial fibrillation     failed cardioversion in past  -repeat DC-CV on October 5th, 2010  . Personal history of DVT (deep vein thrombosis) 2008  . Obstructive sleep apnea   . HTN (hypertension)   . Morbid obesity   . Shingles     with postherpetic neuraigia  . GERD (gastroesophageal reflux disease)   . Hyperlipidemia   . Meningitis 1950s  . Gallstones     s/p cholecystectomy  . CAD (coronary artery disease)     nonobstructive by cath 8/10 - LAD 40-50% w mild PAH mean 29 w PVR 3.2 Woods  . AAA (abdominal aortic aneurysm)     small  . PE (pulmonary embolism)     history of  . Anemia     Medications:  PTA Warfarin 1.25 mg on Monday, 2.5 mg on all other days.   Assessment: [name] w/ SOB sating 86% on 5L O2. PTA warfarin for atrial fibrillation. INR now trending up to 1.3. No bleeding noted. MD does not wish to  have heparin bridge so that was stopped this am.   Goal of Therapy:  Goal INR 1.8-2.2 Monitor platelets by anticoagulation protocol: Yes   Plan:  Stop heparin Warfarin 3 mg  Sheppard CoilFrank Wilson PharmD., BCPS Clinical Pharmacist Pager [phone] 02/19/2014 11:16 AM

## 2014-02-19 NOTE — Progress Notes (Signed)
RT placed patient on her home CPAP unit with 7cmH20 (home setting) with 10L 02 bleed in.

## 2014-02-20 DIAGNOSIS — I5033 Acute on chronic diastolic (congestive) heart failure: Secondary | ICD-10-CM

## 2014-02-20 DIAGNOSIS — J9622 Acute and chronic respiratory failure with hypercapnia: Secondary | ICD-10-CM

## 2014-02-20 LAB — BASIC METABOLIC PANEL
Anion gap: 17 — ABNORMAL HIGH (ref 5–15)
BUN: 50 mg/dL — ABNORMAL HIGH (ref 6–23)
CALCIUM: 9.5 mg/dL (ref 8.4–10.5)
CO2: 29 mEq/L (ref 19–32)
Chloride: 89 mEq/L — ABNORMAL LOW (ref 96–112)
Creatinine, Ser: 1.62 mg/dL — ABNORMAL HIGH (ref 0.50–1.10)
GFR, EST AFRICAN AMERICAN: 33 mL/min — AB (ref >90)
GFR, EST NON AFRICAN AMERICAN: 29 mL/min — AB (ref >90)
Glucose, Bld: 105 mg/dL — ABNORMAL HIGH (ref 70–99)
POTASSIUM: 3.6 meq/L — AB (ref 3.7–5.3)
SODIUM: 135 meq/L — AB (ref 137–147)

## 2014-02-20 LAB — GLUCOSE, CAPILLARY: Glucose-Capillary: 101 mg/dL — ABNORMAL HIGH (ref 70–99)

## 2014-02-20 LAB — URIC ACID: Uric Acid, Serum: 13 mg/dL — ABNORMAL HIGH (ref 2.4–7.0)

## 2014-02-20 LAB — PROTIME-INR
INR: 1.41 (ref 0.00–1.49)
Prothrombin Time: 17.3 seconds — ABNORMAL HIGH (ref 11.6–15.2)

## 2014-02-20 MED ORDER — FUROSEMIDE 10 MG/ML IJ SOLN
60.0000 mg | Freq: Two times a day (BID) | INTRAMUSCULAR | Status: DC
  Administered 2014-02-20: 60 mg via INTRAVENOUS
  Filled 2014-02-20: qty 6

## 2014-02-20 MED ORDER — COLCHICINE 0.6 MG PO TABS
1.2000 mg | ORAL_TABLET | Freq: Once | ORAL | Status: AC
  Administered 2014-02-20: 1.2 mg via ORAL
  Filled 2014-02-20: qty 2

## 2014-02-20 MED ORDER — POTASSIUM CHLORIDE CRYS ER 20 MEQ PO TBCR
40.0000 meq | EXTENDED_RELEASE_TABLET | Freq: Once | ORAL | Status: AC
  Administered 2014-02-20: 40 meq via ORAL
  Filled 2014-02-20: qty 2

## 2014-02-20 MED ORDER — IPRATROPIUM BROMIDE 0.02 % IN SOLN
0.5000 mg | Freq: Four times a day (QID) | RESPIRATORY_TRACT | Status: DC
  Administered 2014-02-20 (×2): 0.5 mg via RESPIRATORY_TRACT
  Filled 2014-02-20 (×2): qty 2.5

## 2014-02-20 MED ORDER — METOLAZONE 2.5 MG PO TABS
2.5000 mg | ORAL_TABLET | ORAL | Status: DC
  Administered 2014-02-20 – 2014-02-27 (×8): 2.5 mg via ORAL
  Filled 2014-02-20 (×10): qty 1

## 2014-02-20 MED ORDER — COLCHICINE 0.6 MG PO TABS
0.6000 mg | ORAL_TABLET | Freq: Once | ORAL | Status: AC
  Administered 2014-02-20: 0.6 mg via ORAL
  Filled 2014-02-20: qty 1

## 2014-02-20 MED ORDER — FUROSEMIDE 80 MG PO TABS
80.0000 mg | ORAL_TABLET | Freq: Two times a day (BID) | ORAL | Status: DC
  Filled 2014-02-20 (×2): qty 1

## 2014-02-20 MED ORDER — LEVALBUTEROL HCL 0.63 MG/3ML IN NEBU
0.6300 mg | INHALATION_SOLUTION | Freq: Four times a day (QID) | RESPIRATORY_TRACT | Status: DC
  Administered 2014-02-20 – 2014-03-02 (×38): 0.63 mg via RESPIRATORY_TRACT
  Filled 2014-02-20 (×73): qty 3

## 2014-02-20 MED ORDER — FUROSEMIDE 10 MG/ML IJ SOLN
60.0000 mg | Freq: Two times a day (BID) | INTRAMUSCULAR | Status: DC
  Administered 2014-02-21 – 2014-02-22 (×3): 60 mg via INTRAVENOUS
  Filled 2014-02-20 (×6): qty 6

## 2014-02-20 MED ORDER — SENNOSIDES-DOCUSATE SODIUM 8.6-50 MG PO TABS
2.0000 | ORAL_TABLET | Freq: Every day | ORAL | Status: DC
  Administered 2014-02-20 – 2014-02-24 (×5): 2 via ORAL
  Filled 2014-02-20 (×5): qty 2

## 2014-02-20 MED ORDER — WARFARIN SODIUM 3 MG PO TABS
3.0000 mg | ORAL_TABLET | Freq: Once | ORAL | Status: AC
  Administered 2014-02-20: 3 mg via ORAL
  Filled 2014-02-20 (×2): qty 1

## 2014-02-20 NOTE — Progress Notes (Signed)
Placed patient on BIPAP 12/6, 50%.  Patient is tolerating well at this time.

## 2014-02-20 NOTE — Progress Notes (Signed)
[facility]  Patient Status: Active (receiving services up to time of hospitalization)  [name] is providing the following services: RN  If patient discharges after hours, please call [phone].   Sherryll BurgerStephanie M George 02/20/2014, 12:42 PM

## 2014-02-20 NOTE — Consult Note (Signed)
PULMONARY / CRITICAL CARE MEDICINE   Name: [name] [name] MRN: [id number] DOB: 03/20/1932    ADMISSION DATE:  02/18/2014 CONSULTATION DATE:  10/11/5   REFERRING MD :  [facility]  CHIEF COMPLAINT:  sob  INITIAL PRESENTATION:   [age] yowf never smoker with morbid obesity last seen by Dr [name] 01/19/13 with dx  CHF, obesity, prior pulmonary emboli  and pulmonary hypertension related to obstructive sleep apnea admit by [facility] 10/9 with one week of worsening resting and noct sob and bouth with dx of ohs/ ? CAP but not better on bipap so pccm svc asked to see am 10/11   STUDIES:     SIGNIFICANT EVENTS:     HISTORY OF PRESENT ILLNESS:   80 y.o. female with Past medical history of chronic diastolic heart failure, obstructive sleep apnea on CPAP, history of DVT PE, chronic A. fib on anticoagulation INR goal 1.8 -2.2, A. fib, coronary artery disease, dyslipidemia, GERD, chronic lower extremity ulcers.  Cc cough and shortness of breath along with hypoxia.  Indolent onset x one week progressively worsening. Patient on her baseline is supposed to be on 2.5 L but since last 3-4 weeks she has been increasing her oxygen to 4 L continuous to improve her shortness of breath. Since last few days it is further worsening and her oxygen saturation has been dropping high 80 despite being on 5 L of oxygen. She called her PCP who referred her to ER for further workup.  She has a cough but does not have any significant sputum production no chest pain no dizziness no lightheadedness. She had nausea and an episode of vomiting but no aspiration. No acid reflux or abdominal pain. No diarrhea no active bleeding no burning urination.  No recent change in her medication.  Patient has been compliant with monitoring her weight and since her weight was increased day before yesterday she took extra dose of Zaroxolyn and this morning her weight was 2 pounds lower.  The patient is coming from home. And at her baseline independent for  most of her ADL.  No obvious day to day or daytime variabilty or assoc excess or purulent  cp or chest tightness, subjective wheeze overt sinus or hb symptoms. No unusual exp hx or h/o childhood pna/ asthma or knowledge of premature birth.  Sleeping ok without nocturnal  or early am exacerbation  of respiratory  c/o's or need for noct saba. Also denies any obvious fluctuation of symptoms with weather or environmental changes or other aggravating or alleviating factors except as outlined above   Current Medications, Allergies, Complete Past Medical History, Past Surgical History, Family History, and Social History were reviewed in [facility] electronic medical record.  ROS  The following are not active complaints unless bolded sore throat, dysphagia, dental problems, itching, sneezing,  nasal congestion or excess/ purulent secretions, ear ache,   fever, chills, sweats, unintended wt loss, pleuritic or exertional cp, hemoptysis,  orthopnea pnd or leg swelling, presyncope, palpitations, heartburn, abdominal pain, anorexia, nausea, vomiting, diarrhea  or change in bowel or urinary habits, change in stools or urine, dysuria,hematuria,  rash, arthralgias, visual complaints, headache, numbness weakness or ataxia or problems with walking or coordination,  change in mood/affect or memory.                  PAST MEDICAL HISTORY :   has a past medical history of Congestive heart failure, unspecified; Chronic atrial fibrillation; Personal history of DVT (deep vein thrombosis) (2008); Obstructive sleep  apnea; HTN (hypertension); Morbid obesity; Shingles; GERD (gastroesophageal reflux disease); Hyperlipidemia; Meningitis (1950s); Gallstones; CAD (coronary artery disease); AAA (abdominal aortic aneurysm); PE (pulmonary embolism); and Anemia.  has past surgical history that includes Appendectomy (1947); Tonsillectomy (1946); hospitalized with meningitis at [facility] [phone]); Cholecystectomy; and  Cardioversion (01/2007). Prior to Admission medications   Medication Sig Start Date End Date Taking? Authorizing Provider  allopurinol (ZYLOPRIM) 100 MG tablet Take 1 tablet (100 mg total) by mouth daily. 06/24/13  Yes Amy Michelle Nasuti, MD  atorvastatin (LIPITOR) 10 MG tablet Take 1 tablet (10 mg total) by mouth daily. 01/12/14  Yes Amy Michelle Nasuti, MD  cetirizine (ZYRTEC ALLERGY) 10 MG tablet Take 10 mg by mouth daily.     Yes Historical Provider, MD  Difluprednate (DUREZOL) 0.05 % EMUL Apply 1 drop to eye 2 (two) times daily.   Yes Historical Provider, MD  diltiazem (CARDIZEM CD) 120 MG 24 hr capsule Take 1 capsule (120 mg total) by mouth daily. 10/11/13  Yes Antonieta Iba, MD  escitalopram (LEXAPRO) 20 MG tablet Take 1 tablet (20 mg total) by mouth daily. 11/04/13  Yes Amy Michelle Nasuti, MD  flecainide (TAMBOCOR) 100 MG tablet Take 1 tablet (100 mg total) by mouth every 12 (twelve) hours. 01/25/14  Yes Antonieta Iba, MD  furosemide (LASIX) 80 MG tablet Take 1 tablet (80 mg total) by mouth 2 (two) times daily. 12/07/13  Yes Antonieta Iba, MD  ipratropium (ATROVENT HFA) 17 MCG/ACT inhaler Inhale 2 puffs into the lungs 4 (four) times daily. 07/07/13  Yes Amy Michelle Nasuti, MD  metolazone (ZAROXOLYN) 2.5 MG tablet Take 2.5 mg by mouth daily as needed. Only take with increased weight. 11/19/13 11/19/14 Yes Antonieta Iba, MD  moxifloxacin (VIGAMOX) 0.5 % ophthalmic solution Place 1 drop into the left eye 2 (two) times daily.   Yes Historical Provider, MD  NON FORMULARY Oxygen 2.5 liters daily   Yes Historical Provider, MD  OxyCODONE (OXYCONTIN) 10 mg T12A 12 hr tablet Take 1 tablet (10 mg total) by mouth every 12 (twelve) hours. 12/23/13  Yes Amy Michelle Nasuti, MD  potassium chloride (K-DUR) 10 MEQ tablet Take 1 tablet (10 mEq total) by mouth daily. 01/26/14  Yes Antonieta Iba, MD  warfarin (COUMADIN) 2.5 MG tablet Take 1.25-2.5 mg by mouth See admin instructions. 1/2 tablet only on Monday and 1 tablet all the rest of  the days   Yes Historical Provider, MD  colchicine 0.6 MG tablet Take 0.6-1.2 mg by mouth daily as needed (for gout flare up).    Historical Provider, MD   Allergies  Allergen Reactions  . Prednisone     Eyes swelling Pt is not sure if it was prednisone.     FAMILY HISTORY:  indicated that her mother is deceased. She indicated that her father is deceased.  SOCIAL HISTORY:  reports that she has never smoked. She has never used smokeless tobacco. She reports that she drinks alcohol. She reports that she does not use illicit drugs.     SUBJECTIVE:   VITAL SIGNS: Temp:  [97.3 F (36.3 C)-99.6 F (37.6 C)] 99.6 F (37.6 C) (10/11 1252) Pulse Rate:  [72-85] 73 (10/11 1252) Resp:  [16-35] 31 (10/11 1252) BP: (108-128)/(49-98) 123/61 mmHg (10/11 1252) SpO2:  [75 %-98 %] 90 % (10/11 1252) FiO2 (%):  [60 %] 60 % (10/11 1252) Weight:  [268 lb 1.3 oz (121.6 kg)] 268 lb 1.3 oz (121.6 kg) (10/11 0431) HEMODYNAMICS:   VENTILATOR SETTINGS:  Vent Mode:  [-]  FiO2 (%):  [60 %] 60 % INTAKE / OUTPUT:  Intake/Output Summary (Last 24 hours) at 02/20/14 1423 Last data filed at 02/20/14 1252  Gross per 24 hour  Intake    360 ml  Output    500 ml  Net   -140 ml    PHYSICAL EXAMINATION: General:  Obese elderly wf at 30 degrees with bipap in place  Pt alert, approp nad   No jvd Oropharanx clear Neck supple Lungs with a few scattered exp > insp rhonchi bilaterally RRR no s3 or or sign murmur Abd obese with excursion limited in supine position Extr wam with no edema or clubbing noted Neuro  No motor deficits   LABS:  CBC  Recent Labs Lab 02/18/14 1609 02/19/14 0224  WBC 15.8* 15.1*  HGB 12.8 11.4*  HCT 39.7 35.8*  PLT 278 301   Coag's  Recent Labs Lab 02/18/14 1641 02/19/14 0224 02/20/14 0236  INR 1.11 1.33 1.41   BMET  Recent Labs Lab 02/18/14 1609 02/19/14 0224 02/20/14 0236  NA 138 139 135*  K 3.1* 3.4* 3.6*  CL 90* 91* 89*  CO2 32 31 29  BUN 52* 53*  50*  CREATININE 1.33* 1.40* 1.62*  GLUCOSE 124* 103* 105*   Electrolytes  Recent Labs Lab 02/18/14 1609 02/18/14 2128 02/19/14 0224 02/20/14 0236  CALCIUM 9.8  --  9.5 9.5  MG  --  2.1  --   --    Sepsis Markers No results found for this basename: LATICACIDVEN, PROCALCITON, O2SATVEN,  in the last 168 hours ABG No results found for this basename: PHART, PCO2ART, PO2ART,  in the last 168 hours Liver Enzymes  Recent Labs Lab 02/19/14 0224  AST 18  ALT 17  ALKPHOS 107  BILITOT 0.4  ALBUMIN 2.6*   Cardiac Enzymes  Recent Labs Lab 02/18/14 1609  PROBNP 1539.0*   Glucose  Recent Labs Lab 02/20/14 0755  GLUCAP 101*    Imaging No results found. Pa and lag cxr 02/18/14 Hypoinflation with mild bibasilar opacification which may be due to  atelectasis or infection.  Mild cardiomegaly with findings suggesting mild vascular congestion.    ASSESSMENT / PLAN:  1) Acute on chronic hypercarbic resp failure secondary to OHS and complicated by [facility]  - agree with bipap esp while in bet but hopefully can start to mobilize   2) ? CAP - on levaquin pre primary svc since 10/9   3) ? chf / PAH already on chronic coumadin  Agree with empirical diuresis here  4) this is not copd > d/c atrovent  I really have nothing to add to her care at present but will let Dr [name] know in am of her admit. No changes rec for now   Sandrea HughsMichael Kazi Reppond, MD Pulmonary and Critical Care Medicine [facility] Cell [phone] After 5:30 PM or weekends, call [phone]

## 2014-02-20 NOTE — Progress Notes (Signed)
[facility] PROGRESS NOTE  [name] [name] [id number] DOB: 07/09/1931 DOA: 02/18/2014 PCP: [name], MD Brief narrative 80 y.o. female with Past medical history of chronic diastolic heart failure, obstructive sleep apnea on CPAP, on chr home o2 ( 2.5L [place] ) history of DVT& PE in 2008, chronic A. fib on coumadin , coronary artery disease, dyslipidemia, GERD, chronic lower extremity ulcers presenting with complaints of cough and shortness of breath along with hypoxia. Patient progressively short of breath for past 3-4 weeks.  Patient admitted to SDU with acute on chr hypoxic resp failure secondary to CAP  Assessment/Plan: Acute on chr hypoxic resp failure  secondary to CAP. possble b/l infiltrate on CXR.  empiric abx. Follow blood cx, urine strep and legionella. Sputum cx. Scheduled nebs and antitussives. Was transitioned to [place] yesterday but placed back on BiPAP due to increased work of breathing. -will ask pulmonary to evaluate. Sees Dr [name]   Chronic diastolic heart failure.  Euvolemic on exam  continue her Lasix. Resume zaroxyline. Monitor I/O and daily wts  History of A. fib and PE  INR is subtherapeutic. PE in 2008. Currently in sinus. coumadin dose per pharmacy continue Cardizem and flecainide  Acute gouty arthritis Has swelling and tenderness on left great toe. Has underlying CKD so will not use NSAIDs. Allergic to prednisone. Will place on colchicine and monitor. Check uric acid   Dyslipidemia  cont statin      Code Status: FULL CODE Family Communication: spoke with sonat bedside Disposition Plan: monitor in SDU   Consultants:  none  Procedures:  none  Antibiotics:  Levaquin ( 10/9--)  HPI/Subjective: Pt  Transitioned to [place] but due to increased work of breathing required BiPAP after few hours.  Objective: Filed Vitals:   02/20/14 1252  BP: 123/61  Pulse: 73  Temp: 99.6 F (37.6 C)  Resp: 31    Intake/Output Summary (Last 24 hours) at  02/20/14 1310 Last data filed at 02/20/14 1252  Gross per 24 hour  Intake    360 ml  Output    500 ml  Net   -140 ml   Filed Weights   02/18/14 2207 02/19/14 0600 02/20/14 0431  Weight: 120.6 kg (265 lb 14 oz) 121 kg (266 lb 12.1 oz) 121.6 kg (268 lb 1.3 oz)    Exam:   General:  Elderly obese female in NAD   HEENT: on BiPAP, moist oral mucosa   chest: diminished breath sounds b/l, no added sounds   CVS: NS1&S2, no murmurs  Abd: soft, NT, ND, BS+  Ext: chr skin graft b/l, no edema   CNS: alert and oriented   Data Reviewed: Basic Metabolic Panel:  Recent Labs Lab 02/18/14 1609 02/18/14 2128 02/19/14 0224 02/20/14 0236  NA 138  --  139 135*  K 3.1*  --  3.4* 3.6*  CL 90*  --  91* 89*  CO2 32  --  31 29  GLUCOSE 124*  --  103* 105*  BUN 52*  --  53* 50*  CREATININE 1.33*  --  1.40* 1.62*  CALCIUM 9.8  --  9.5 9.5  MG  --  2.1  --   --    Liver Function Tests:  Recent Labs Lab 02/19/14 0224  AST 18  ALT 17  ALKPHOS 107  BILITOT 0.4  PROT 7.6  ALBUMIN 2.6*   No results found for this basename: LIPASE, AMYLASE,  in the last 168 hours No results found for this basename: AMMONIA,  in  the last 168 hours CBC:  Recent Labs Lab 02/18/14 1609 02/19/14 0224  WBC 15.8* 15.1*  NEUTROABS 13.9*  --   HGB 12.8 11.4*  HCT 39.7 35.8*  MCV 90.0 90.2  PLT 278 301   Cardiac Enzymes: No results found for this basename: CKTOTAL, CKMB, CKMBINDEX, TROPONINI,  in the last 168 hours BNP (last 3 results)  Recent Labs  08/31/13 1631 02/18/14 1609  PROBNP 16475.0* 1539.0*   CBG:  Recent Labs Lab 02/20/14 0755  GLUCAP 101*    Recent Results (from the past 240 hour(s))  MRSA PCR SCREENING     Status: None   Collection Time    02/18/14 10:34 PM      Result Value Ref Range Status   MRSA by PCR NEGATIVE  NEGATIVE Final   Comment:            The GeneXpert MRSA Assay (FDA     approved for NASAL specimens     only), is one component of a     comprehensive  MRSA colonization     surveillance program. It is not     intended to diagnose MRSA     infection nor to guide or     monitor treatment for     MRSA infections.     Studies: Dg Chest 2 View  02/18/2014   CLINICAL DATA:  Shortness of breath 1 week. Patient on home oxygen for COPD.  EXAM: CHEST  2 VIEW  COMPARISON:  08/31/2013 and 01/19/2013  FINDINGS: Exam was performed with patient in the sitting position. Patient is rotated to the right. Lungs are hypoinflated with mild prominence of the perihilar markings. There is mild elevation the right hemidiaphragm with scalloping of the right diaphragm unchanged. Possible mild bibasilar opacification which may be due to atelectasis or infection. Stable cardiomegaly. Mild biapical pleural thickening. Degenerative changes of the spine with mild curvature of the thoracic spine convex to the left.  IMPRESSION: Hypoinflation with mild bibasilar opacification which may be due to atelectasis or infection.  Mild cardiomegaly with findings suggesting mild vascular congestion.   Electronically Signed   By: Elberta Fortisaniel  Boyle M.D.   On: 02/18/2014 17:14    Scheduled Meds: . allopurinol  100 mg Oral Daily  . atorvastatin  10 mg Oral Daily  . diltiazem  120 mg Oral Daily  . escitalopram  20 mg Oral Daily  . flecainide  100 mg Oral Q12H  . furosemide  60 mg Intravenous BID  . guaiFENesin  600 mg Oral BID  . ipratropium  0.5 mg Nebulization QID  . levalbuterol  0.63 mg Nebulization QID  . levofloxacin (LEVAQUIN) IV  750 mg Intravenous Q48H  . metolazone  2.5 mg Oral Daily  . OxyCODONE  10 mg Oral Q12H  . senna-docusate  2 tablet Oral Daily  . warfarin  3 mg Oral ONCE-1800  . Warfarin - Pharmacist Dosing Inpatient   Does not apply q1800   Continuous Infusions:      Time spent: 35 minutes    Kerisha Goughnour  [facility] Pager [phone] If 7PM-7AM, please contact night-coverage at [email or web address], password [email or web address] 02/20/2014, 1:10 PM  LOS: 2 days

## 2014-02-20 NOTE — Progress Notes (Signed)
ANTICOAGULATION CONSULT NOTE   Pharmacy Consult for warfarin Indication: history of PE/ atrial fibrillation  Allergies  Allergen Reactions  . Prednisone     Eyes swelling Pt is not sure if it was prednisone.     Patient Measurements: Height: 5\' 5"  (165.1 cm) Weight: 268 lb 1.3 oz (121.6 kg) IBW/kg (Calculated) : 57 Heparin Dosing Weight: 84.2 kg  Vital Signs: Temp: 98 F (36.7 C) (10/11 0757) Temp Source: Axillary (10/11 0757) BP: 117/98 mmHg (10/11 0757) Pulse Rate: 83 (10/11 0757)  Labs:  Recent Labs  02/18/14 1609 02/18/14 1641 02/19/14 0224 02/19/14 0935 02/20/14 0236  HGB 12.8  --  11.4*  --   --   HCT 39.7  --  35.8*  --   --   PLT 278  --  301  --   --   LABPROT  --  14.3 16.5*  --  17.3*  INR  --  1.11 1.33  --  1.41  HEPARINUNFRC  --   --  <0.10* <0.10*  --   CREATININE 1.33*  --  1.40*  --  1.62*    Estimated Creatinine Clearance: 36.2 ml/min (by C-G formula based on Cr of 1.62).   Medical History: Past Medical History  Diagnosis Date  . Congestive heart failure, unspecified     a. ECHO 6/0: EF 55-60%, mild LVH, mildly dilated RV w. mildly dec fx, RVSP 67  . Chronic atrial fibrillation     failed cardioversion in past  -repeat DC-CV on October 5th, 2010  . Personal history of DVT (deep vein thrombosis) 2008  . Obstructive sleep apnea   . HTN (hypertension)   . Morbid obesity   . Shingles     with postherpetic neuraigia  . GERD (gastroesophageal reflux disease)   . Hyperlipidemia   . Meningitis 1950s  . Gallstones     s/p cholecystectomy  . CAD (coronary artery disease)     nonobstructive by cath 8/10 - LAD 40-50% w mild PAH mean 29 w PVR 3.2 [place]  . AAA (abdominal aortic aneurysm)     small  . PE (pulmonary embolism)     history of  . Anemia     Medications:  PTA Warfarin 1.25 mg on Monday, 2.5 mg on all other days.   Assessment: [name] admitted w/ SOB . PTA warfarin for atrial fibrillation. INR now trending up to 1.4. No  bleeding noted. MD does not wish to have heparin bridge so that was stopped yesterday.   No bleeding complications noted.  Goal of Therapy:  Goal INR 1.8-2.2 Monitor platelets by anticoagulation protocol: Yes   Plan:  Repeat Warfarin 3 mg  [name] CoilFrank Torry [name] PharmD., BCPS Clinical Pharmacist Pager [phone] 02/20/2014 8:00 AM

## 2014-02-20 NOTE — Progress Notes (Signed)
Placed patient on BIPAP12/6, 60% FIO2 due to decreased sat 86% and increased work of breathing.  Patient tolerating well at this time.

## 2014-02-21 ENCOUNTER — Inpatient Hospital Stay (HOSPITAL_COMMUNITY): Payer: Medicare Other

## 2014-02-21 DIAGNOSIS — M10072 Idiopathic gout, left ankle and foot: Secondary | ICD-10-CM

## 2014-02-21 DIAGNOSIS — J9621 Acute and chronic respiratory failure with hypoxia: Secondary | ICD-10-CM | POA: Diagnosis present

## 2014-02-21 LAB — BASIC METABOLIC PANEL
ANION GAP: 14 (ref 5–15)
BUN: 59 mg/dL — ABNORMAL HIGH (ref 6–23)
CHLORIDE: 90 meq/L — AB (ref 96–112)
CO2: 32 mEq/L (ref 19–32)
Calcium: 9.7 mg/dL (ref 8.4–10.5)
Creatinine, Ser: 1.7 mg/dL — ABNORMAL HIGH (ref 0.50–1.10)
GFR calc non Af Amer: 27 mL/min — ABNORMAL LOW (ref >90)
GFR, EST AFRICAN AMERICAN: 32 mL/min — AB (ref >90)
Glucose, Bld: 82 mg/dL (ref 70–99)
Potassium: 4.3 mEq/L (ref 3.7–5.3)
SODIUM: 136 meq/L — AB (ref 137–147)

## 2014-02-21 LAB — PROTIME-INR
INR: 1.58 — AB (ref 0.00–1.49)
PROTHROMBIN TIME: 18.9 s — AB (ref 11.6–15.2)

## 2014-02-21 MED ORDER — CHLORHEXIDINE GLUCONATE 0.12 % MT SOLN
15.0000 mL | Freq: Two times a day (BID) | OROMUCOSAL | Status: DC
  Administered 2014-02-21 – 2014-03-02 (×18): 15 mL via OROMUCOSAL
  Filled 2014-02-21 (×21): qty 15

## 2014-02-21 MED ORDER — WARFARIN SODIUM 4 MG PO TABS
4.0000 mg | ORAL_TABLET | Freq: Once | ORAL | Status: AC
  Administered 2014-02-21: 4 mg via ORAL
  Filled 2014-02-21: qty 1

## 2014-02-21 MED ORDER — CETYLPYRIDINIUM CHLORIDE 0.05 % MT LIQD
7.0000 mL | Freq: Two times a day (BID) | OROMUCOSAL | Status: DC
  Administered 2014-02-21 – 2014-03-02 (×15): 7 mL via OROMUCOSAL

## 2014-02-21 MED ORDER — BUDESONIDE 0.5 MG/2ML IN SUSP
0.5000 mg | Freq: Two times a day (BID) | RESPIRATORY_TRACT | Status: DC
  Administered 2014-02-21 – 2014-03-02 (×17): 0.5 mg via RESPIRATORY_TRACT
  Filled 2014-02-21 (×21): qty 2

## 2014-02-21 NOTE — Progress Notes (Signed)
ANTICOAGULATION CONSULT NOTE   Pharmacy Consult for warfarin Indication: history of PE/ atrial fibrillation  Allergies  Allergen Reactions  . Prednisone     Eyes swelling Pt is not sure if it was prednisone.     Patient Measurements: Height: 5\' 4"  (162.6 cm) Weight: 267 lb 10.2 oz (121.4 kg) IBW/kg (Calculated) : 54.7 Heparin Dosing Weight: 84.2 kg  Vital Signs: Temp: 98.4 F (36.9 C) (10/12 0328) Temp Source: Axillary (10/12 0328) BP: 108/36 mmHg (10/12 1021) Pulse Rate: 68 (10/12 0328)  Labs:  Recent Labs  02/18/14 1609  02/19/14 0224 02/19/14 0935 02/20/14 0236 02/21/14 0422  HGB 12.8  --  11.4*  --   --   --   HCT 39.7  --  35.8*  --   --   --   PLT 278  --  301  --   --   --   LABPROT  --   < > 16.5*  --  17.3* 18.9*  INR  --   < > 1.33  --  1.41 1.58*  HEPARINUNFRC  --   --  <0.10* <0.10*  --   --   CREATININE 1.33*  --  1.40*  --  1.62* 1.70*  < > = values in this interval not displayed.  Estimated Creatinine Clearance: 33.9 ml/min (by C-G formula based on Cr of 1.7).   Medical History: Past Medical History  Diagnosis Date  . Congestive heart failure, unspecified     a. ECHO 6/0: EF 55-60%, mild LVH, mildly dilated RV w. mildly dec fx, RVSP 67  . Chronic atrial fibrillation     failed cardioversion in past  -repeat DC-CV on October 5th, 2010  . Personal history of DVT (deep vein thrombosis) 2008  . Obstructive sleep apnea   . HTN (hypertension)   . Morbid obesity   . Shingles     with postherpetic neuraigia  . GERD (gastroesophageal reflux disease)   . Hyperlipidemia   . Meningitis 1950s  . Gallstones     s/p cholecystectomy  . CAD (coronary artery disease)     nonobstructive by cath 8/10 - LAD 40-50% w mild PAH mean 29 w PVR 3.2 [place]  . AAA (abdominal aortic aneurysm)     small  . PE (pulmonary embolism)     history of  . Anemia     Medications:  PTA Warfarin 1.25 mg on Monday, 2.5 mg on all other days.   Assessment: [name]  admitted w/ SOB. On warfarin PTA for atrial fibrillation and history of PE. INR is sbuthterapeutic but is appropriately trending up, now at 1.58. No bleeding noted. No bleeding complications noted.  PTA dose: 1.25 mg on Mondays, 2.5 mg on all other days   Goal of Therapy:  Goal INR 1.8-2.2 Monitor platelets by anticoagulation protocol: Yes   Plan:  -Warfarin 4 mg po 1 -Daily INR -Monitor for s/sx bleeding    Agapito GamesAlison Amandeep Hogston, PharmD, BCPS Clinical Pharmacist Pager: [phone] 02/21/2014 10:29 AM

## 2014-02-21 NOTE — Progress Notes (Addendum)
[facility] PROGRESS NOTE  [name] [name] T Phegley GEX:[id number] DOB: 03/03/1932 DOA: 02/18/2014 PCP: [name], MD Brief narrative 80 y.o. female with Past medical history of chronic diastolic heart failure, obstructive sleep apnea on CPAP, on chr home o2 ( 2.5L [place] ) history of DVT& PE in 2008, chronic A. fib on coumadin , coronary artery disease, dyslipidemia, GERD, chronic lower extremity ulcers presenting with complaints of cough and shortness of breath along with hypoxia. Patient progressively short of breath for past 3-4 weeks.  Patient admitted to SDU with acute on chr hypoxic resp failure secondary to CAP  Assessment/Plan: Acute on chr hypoxic resp failure  secondary to CAP. possble b/l infiltrate on CXR.  empiric abx ( day 4) .  blood cx, urine strep and legionella all negative. Sputum cx. Scheduled xopenex neb and antitussives. -has tolerated [place] most of the day yesterday , on BiPAP overnight -appreciate pulmonary evaluation and recommendations.  Sees Dr [name]   Chronic diastolic heart failure.  doesnot appear volume overloaded, has gained about a lb since admission. Refused IV lasix  continue her home dose Lasix. Resumed zaroxyline. Monitor I/O and daily wts  History of A. fib and PE  INR is subtherapeutic. PE in 2008. Currently in sinus. coumadin dose per pharmacy continue Cardizem and flecainide  Acute gouty arthritis Has swelling and tenderness on left great toe. Has underlying CKD so will not use NSAIDs. Allergic to prednisone. given therapeutic dose of colchicine and nowe improving. Elevated uric acid continue home dose oxycodone  CKD stage II  renal function worsened since admission, but at baseline. monitor    Dyslipidemia  cont statin      Code Status: FULL CODE Family Communication: none at bedside today Disposition Plan: transfer to tele if stable on [place] today. PT eval. possible d/c home in 1-2  days   Consultants:  none  Procedures:  none  Antibiotics:  Levaquin ( 10/9--)  HPI/Subjective: On BiPAP this am but Tolerated [place] all day. C/o pain in left great towe with finding of acute gout. Also reports generalized body aches ( better than yesterday and being on oxycodone at home). Lt toe pain improved with colchicine.  Objective: Filed Vitals:   02/21/14 0328  BP: 102/62  Pulse: 68  Temp: 98.4 F (36.9 C)  Resp: 22    Intake/Output Summary (Last 24 hours) at 02/21/14 0831 Last data filed at 02/20/14 2104  Gross per 24 hour  Intake    770 ml  Output    500 ml  Net    270 ml   Filed Weights   02/19/14 0600 02/20/14 0431 02/21/14 0328  Weight: 121 kg (266 lb 12.1 oz) 121.6 kg (268 lb 1.3 oz) 121.4 kg (267 lb 10.2 oz)    Exam:   General:  Elderly obese female in NAD   HEENT: on BiPAP, moist oral mucosa   chest: diminished breath sounds b/l due to body habitus, no added sounds   CVS: NS1&S2, no murmurs  Abd: soft, NT, ND, BS+  Ext: chr skin graft b/l, no edema, erythema and swelling over left great toe improved from yesterday, decreased tenderness   CNS: alert and oriented   Data Reviewed: Basic Metabolic Panel:  Recent Labs Lab 02/18/14 1609 02/18/14 2128 02/19/14 0224 02/20/14 0236 02/21/14 0422  NA 138  --  139 135* 136*  K 3.1*  --  3.4* 3.6* 4.3  CL 90*  --  91* 89* 90*  CO2 32  --  31 29 32  GLUCOSE 124*  --  103* 105* 82  BUN 52*  --  53* 50* 59*  CREATININE 1.33*  --  1.40* 1.62* 1.70*  CALCIUM 9.8  --  9.5 9.5 9.7  MG  --  2.1  --   --   --    Liver Function Tests:  Recent Labs Lab 02/19/14 0224  AST 18  ALT 17  ALKPHOS 107  BILITOT 0.4  PROT 7.6  ALBUMIN 2.6*   No results found for this basename: LIPASE, AMYLASE,  in the last 168 hours No results found for this basename: AMMONIA,  in the last 168 hours CBC:  Recent Labs Lab 02/18/14 1609 02/19/14 0224  WBC 15.8* 15.1*  NEUTROABS 13.9*  --   HGB 12.8 11.4*   HCT 39.7 35.8*  MCV 90.0 90.2  PLT 278 301   Cardiac Enzymes: No results found for this basename: CKTOTAL, CKMB, CKMBINDEX, TROPONINI,  in the last 168 hours BNP (last 3 results)  Recent Labs  08/31/13 1631 02/18/14 1609  PROBNP 16475.0* 1539.0*   CBG:  Recent Labs Lab 02/20/14 0755  GLUCAP 101*    Recent Results (from the past 240 hour(s))  CULTURE, BLOOD (ROUTINE X 2)     Status: None   Collection Time    02/18/14  9:22 PM      Result Value Ref Range Status   Specimen Description BLOOD RIGHT ARM   Final   Special Requests BOTTLES DRAWN AEROBIC AND ANAEROBIC 10CC EACH   Final   Culture  Setup Time     Final   Value: 02/19/2014 03:20     Performed at [facility]   Culture     Final   Value:        BLOOD CULTURE RECEIVED NO GROWTH TO DATE CULTURE WILL BE HELD FOR 5 DAYS BEFORE ISSUING A FINAL NEGATIVE REPORT     Performed at [facility]   Report Status PENDING   Incomplete  CULTURE, BLOOD (ROUTINE X 2)     Status: None   Collection Time    02/18/14  9:28 PM      Result Value Ref Range Status   Specimen Description BLOOD RIGHT FOREARM   Final   Special Requests BOTTLES DRAWN AEROBIC AND ANAEROBIC [facility]2CC EACH   Final   Culture  Setup Time     Final   Value: 02/19/2014 03:19     Performed at [facility]   Culture     Final   Value:        BLOOD CULTURE RECEIVED NO GROWTH TO DATE CULTURE WILL BE HELD FOR 5 DAYS BEFORE ISSUING A FINAL NEGATIVE REPORT     Performed at [facility]   Report Status PENDING   Incomplete  MRSA PCR SCREENING     Status: None   Collection Time    02/18/14 10:34 PM      Result Value Ref Range Status   MRSA by PCR NEGATIVE  NEGATIVE Final   Comment:            The GeneXpert MRSA Assay (FDA     approved for NASAL specimens     only), is one component of a     comprehensive MRSA colonization     surveillance program. It is not     intended to diagnose MRSA     infection nor to guide or     monitor  treatment for     MRSA infections.  Studies: No results found.  Scheduled Meds: . allopurinol  100 mg Oral Daily  . antiseptic oral rinse  7 mL Mouth Rinse q12n4p  . atorvastatin  10 mg Oral Daily  . chlorhexidine  15 mL Mouth Rinse BID  . diltiazem  120 mg Oral Daily  . escitalopram  20 mg Oral Daily  . flecainide  100 mg Oral Q12H  . furosemide  60 mg Intravenous Q12H  . guaiFENesin  600 mg Oral BID  . levalbuterol  0.63 mg Nebulization QID  . levofloxacin (LEVAQUIN) IV  750 mg Intravenous Q48H  . metolazone  2.5 mg Oral Daily  . OxyCODONE  10 mg Oral Q12H  . senna-docusate  2 tablet Oral Daily  . Warfarin - Pharmacist Dosing Inpatient   Does not apply q1800   Continuous Infusions:      Time spent: 35 minutes    Gurman Ashland  [facility] Pager [phone] If 7PM-7AM, please contact night-coverage at [email or web address], password [facility] 02/21/2014, 8:31 AM  LOS: 3 days

## 2014-02-21 NOTE — Progress Notes (Addendum)
Pt.taken off BiPAP @ this time, placed on Med aerosol treatment, plan to place to 55%/14L Venturi mask, RT to monitor.

## 2014-02-21 NOTE — Progress Notes (Addendum)
PULMONARY / CRITICAL CARE MEDICINE   Name: [name] [name] MRN: [id number] DOB: 12/03/1931    ADMISSION DATE:  02/18/2014 CONSULTATION DATE:  10/11/5   REFERRING MD :  [facility]  CHIEF COMPLAINT:  sob  INITIAL PRESENTATION:   [age] yowf never smoker with morbid obesity last seen by Dr [name] 01/19/13 with dx CHF, obesity, prior pulmonary emboli and pulmonary hypertension related to obstructive sleep apnea admit by [facility] 10/9 with one week of worsening resting and noct sob  with dx of OHS/ ? CAP but not better on bipap so pccm svc asked to see am 10/11    SUBJECTIVE:  Mild rest dyspnea. Mod to severe dyspnea on minimal exertion. Requiring 50% VM  VITAL SIGNS: Temp:  [97.5 F (36.4 C)-99.6 F (37.6 C)] 98.4 F (36.9 C) (10/12 0328) Pulse Rate:  [67-76] 68 (10/12 0328) Resp:  [19-31] 22 (10/12 0328) BP: (102-123)/(36-62) 108/36 mmHg (10/12 1021) SpO2:  [89 %-95 %] 89 % (10/12 0839) FiO2 (%):  [50 %-60 %] 55 % (10/12 1129) Weight:  [121.4 kg (267 lb 10.2 oz)] 121.4 kg (267 lb 10.2 oz) (10/12 0328) HEMODYNAMICS:   VENTILATOR SETTINGS: Vent Mode:  [-]  FiO2 (%):  [50 %-60 %] 55 % INTAKE / OUTPUT:  Intake/Output Summary (Last 24 hours) at 02/21/14 1136 Last data filed at 02/20/14 2104  Gross per 24 hour  Intake    370 ml  Output    500 ml  Net   -130 ml    PHYSICAL EXAMINATION: General:  Mild rest dyspnea, cognition intact No focal deficits HEENT WNL JVP not well visualized Scattered crackles and wheezes RRR s M Abd obese, soft, +BS Ext warm with no edema   LABS: I have reviewed all of today's lab results. Relevant abnormalities are discussed in the A/P section  CXR: NNF    ASSESSMENT: Acute on chronic hypercarbic resp failure Unclear what explains the acute component L basilar crackles unexplained on prior CXR Scattered wheezes OHS PAH   PLAN: Cont current Rx Wean O2 as tolerated Add nebulized budesonide Recheck PA/lat CXR   Billy Fischeravid Charlaine Utsey, MD ; [facility]  service Mobile [phone].  After 5:30 PM or weekends, call [phone]

## 2014-02-21 NOTE — Progress Notes (Signed)
PT Cancellation Note  Patient Details Name: [name] [name] MRN: [id number] DOB: 05/10/1932   Cancelled Treatment:    Reason Eval/Treat Not Completed: Patient at procedure or test/unavailable    Toney Sangabor, Latissa Frick Beth 02/21/2014, 12:50 PM Delaney MeigsMaija Tabor Locklan Canoy, PT [phone]

## 2014-02-22 ENCOUNTER — Ambulatory Visit: Payer: Medicare Other | Admitting: Pulmonary Disease

## 2014-02-22 LAB — BASIC METABOLIC PANEL
ANION GAP: 16 — AB (ref 5–15)
BUN: 75 mg/dL — AB (ref 6–23)
CALCIUM: 9.6 mg/dL (ref 8.4–10.5)
CO2: 33 mEq/L — ABNORMAL HIGH (ref 19–32)
Chloride: 89 mEq/L — ABNORMAL LOW (ref 96–112)
Creatinine, Ser: 1.83 mg/dL — ABNORMAL HIGH (ref 0.50–1.10)
GFR calc non Af Amer: 25 mL/min — ABNORMAL LOW (ref >90)
GFR, EST AFRICAN AMERICAN: 29 mL/min — AB (ref >90)
Glucose, Bld: 92 mg/dL (ref 70–99)
Potassium: 3.8 mEq/L (ref 3.7–5.3)
Sodium: 138 mEq/L (ref 137–147)

## 2014-02-22 LAB — SEDIMENTATION RATE: Sed Rate: 127 mm/hr — ABNORMAL HIGH (ref 0–22)

## 2014-02-22 LAB — PROTIME-INR
INR: 1.55 — AB (ref 0.00–1.49)
Prothrombin Time: 18.6 seconds — ABNORMAL HIGH (ref 11.6–15.2)

## 2014-02-22 LAB — PRO B NATRIURETIC PEPTIDE: Pro B Natriuretic peptide (BNP): 2319 pg/mL — ABNORMAL HIGH (ref 0–450)

## 2014-02-22 MED ORDER — WARFARIN SODIUM 4 MG PO TABS
4.0000 mg | ORAL_TABLET | Freq: Once | ORAL | Status: AC
  Administered 2014-02-22: 4 mg via ORAL
  Filled 2014-02-22: qty 1

## 2014-02-22 MED ORDER — DICLOFENAC SODIUM 1 % TD GEL
2.0000 g | Freq: Four times a day (QID) | TRANSDERMAL | Status: DC
  Administered 2014-02-22 – 2014-03-02 (×28): 2 g via TOPICAL
  Filled 2014-02-22: qty 100

## 2014-02-22 MED ORDER — FUROSEMIDE 10 MG/ML IJ SOLN
80.0000 mg | Freq: Two times a day (BID) | INTRAMUSCULAR | Status: DC
  Administered 2014-02-22 – 2014-02-23 (×2): 80 mg via INTRAVENOUS
  Administered 2014-02-23: 40 mg via INTRAVENOUS
  Administered 2014-02-24 – 2014-02-28 (×9): 80 mg via INTRAVENOUS
  Filled 2014-02-22 (×14): qty 8

## 2014-02-22 NOTE — Progress Notes (Signed)
ANTICOAGULATION CONSULT NOTE   Pharmacy Consult for warfarin Indication: history of PE/ atrial fibrillation  Allergies  Allergen Reactions  . Prednisone     Eyes swelling Pt is not sure if it was prednisone.     Patient Measurements: Height: 5\' 4"  (162.6 cm) Weight: 262 lb 12.6 oz (119.2 kg) IBW/kg (Calculated) : 54.7 Heparin Dosing Weight: 84.2 kg  Vital Signs: Temp: 97.8 F (36.6 C) (10/13 1148) Temp Source: Axillary (10/13 1148) BP: 114/51 mmHg (10/13 1148) Pulse Rate: 65 (10/13 1148)  Labs:  Recent Labs  02/20/14 0236 02/21/14 0422 02/22/14 0251  LABPROT 17.3* 18.9*  --   INR 1.41 1.58*  --   CREATININE 1.62* 1.70* 1.83*    Estimated Creatinine Clearance: 31.2 ml/min (by C-G formula based on Cr of 1.83).   Medical History: Past Medical History  Diagnosis Date  . Congestive heart failure, unspecified     a. ECHO 6/0: EF 55-60%, mild LVH, mildly dilated RV w. mildly dec fx, RVSP 67  . Chronic atrial fibrillation     failed cardioversion in past  -repeat DC-CV on October 5th, 2010  . Personal history of DVT (deep vein thrombosis) 2008  . Obstructive sleep apnea   . HTN (hypertension)   . Morbid obesity   . Shingles     with postherpetic neuraigia  . GERD (gastroesophageal reflux disease)   . Hyperlipidemia   . Meningitis 1950s  . Gallstones     s/p cholecystectomy  . CAD (coronary artery disease)     nonobstructive by cath 8/10 - LAD 40-50% w mild PAH mean 29 w PVR 3.2 [place]  . AAA (abdominal aortic aneurysm)     small  . PE (pulmonary embolism)     history of  . Anemia     Medications:  PTA Warfarin 1.25 mg on Monday, 2.5 mg on all other days.   Assessment: [name] admitted w/ SOB. On warfarin PTA for atrial fibrillation and history of PE. INR is sbuthterapeutic at 1.55.  No bleeding complications noted.  PTA dose: 1.25 mg on Mondays, 2.5 mg on all other days   Goal of Therapy:  Goal INR 1.8-2.2 Monitor platelets by anticoagulation  protocol: Yes   Plan:  -Warfarin 4 mg po 1 -Daily INR -Monitor for s/sx bleeding    Agapito GamesAlison Tequia Wolman, PharmD, BCPS Clinical Pharmacist Pager: [phone] 02/22/2014 2:17 PM

## 2014-02-22 NOTE — Progress Notes (Signed)
[facility] PROGRESS NOTE  [name] [name] OEV:[id number] DOB: 07-26-1931 DOA: 02/18/2014 PCP: Eliezer Lofts, MD Brief narrative 80 y.o. female with Past medical history of chronic diastolic heart failure, obstructive sleep apnea on CPAP, on chr home o2 ( 2.5L [place] ) history of DVT& PE in 2008, chronic A. fib on coumadin , coronary artery disease, dyslipidemia, GERD, chronic lower extremity ulcers presenting with complaints of cough and shortness of breath along with hypoxia. Patient progressively short of breath for past 3-4 weeks.  Patient admitted to SDU with acute on chr hypoxic resp failure secondary to CAP  Assessment/Plan: Acute on chr hypoxic resp failure  secondary to CAP vs volume overload vs? pneumonitis . possble b/l infiltrate on admission CXR.  empiric abx ( day 5) .  blood cx, urine strep and legionella all negative.  Scheduled xopenex neb and antitussives. Added Pulmicort neb twice a day. -Patient still quite short of breath with followup x-ray showing increased pulmonary edema. Elevated proBNP noted. I've increased her IV Lasix dose to 80 mg twice daily. Place Foley foreclose urine output monitoring. ESR also elevated. Check 2-D echo -appreciate pulmonary evaluation and recommendations.   -Placed on BiPAP this morning   Chronic diastolic heart failure.  IV   continue her home dose Lasix. Resumed zaroxyline. Monitor I/O and daily wts  History of A. fib and PE  INR is subtherapeutic. PE in 2008. Currently in sinus. coumadin dose per pharmacy continue Cardizem and flecainide  Acute gouty arthritis Has swelling and tenderness on left great toe. Has underlying CKD so will not use NSAIDs. Allergic to prednisone.  Elevated uric acid. Has improved minimally with therapeutic dose of Zosyn. We'll apply topical Voltaren gel continue home dose oxycodone  CKD stage II  renal function worsened since admission, but at baseline. Monitor while on  IV lasix dose    Dyslipidemia   cont statin      Code Status: FULL CODE Family Communication: none at bedside today. Will update her son Disposition Plan: Continue statin, and can for now   Consultants:  none  Procedures:  none  Antibiotics:  Levaquin ( 10/9--)  HPI/Subjective: Patient noted to have increased bibasilar crackles with findings of pulmonary edema on chest x-ray. Hypoxic to 80s  on [place] this am and placed on BIPAP.  Objective: Filed Vitals:   02/22/14 1148  BP: 114/51  Pulse: 65  Temp: 97.8 F (36.6 C)  Resp: 18    Intake/Output Summary (Last 24 hours) at 02/22/14 1444 Last data filed at 02/22/14 1000  Gross per 24 hour  Intake    960 ml  Output   1250 ml  Net   -290 ml   Filed Weights   02/20/14 0431 02/21/14 0328 02/22/14 0500  Weight: 121.6 kg (268 lb 1.3 oz) 121.4 kg (267 lb 10.2 oz) 119.2 kg (262 lb 12.6 oz)    Exam:   General:  Elderly obese female appears dyspnic   HEENT:  moist oral mucosa   chest: Coarse breath sounds b/l , no added sounds   CVS: NS1&S2, no murmurs  Abd: soft, NT, ND, BS+  Ext: chr skin graft b/l, no edema, erythema with warmth and swelling over left great toe  , tender to palpation   CNS: alert and oriented   Data Reviewed: Basic Metabolic Panel:  Recent Labs Lab 02/18/14 1609 02/18/14 2128 02/19/14 0224 02/20/14 0236 02/21/14 0422 02/22/14 0251  NA 138  --  139 135* 136* 138  K 3.1*  --  3.4* 3.6* 4.3 3.8  CL 90*  --  91* 89* 90* 89*  CO2 32  --  31 29 32 33*  GLUCOSE 124*  --  103* 105* 82 92  BUN 52*  --  53* 50* 59* 75*  CREATININE 1.33*  --  1.40* 1.62* 1.70* 1.83*  CALCIUM 9.8  --  9.5 9.5 9.7 9.6  MG  --  2.1  --   --   --   --    Liver Function Tests:  Recent Labs Lab 02/19/14 0224  AST 18  ALT 17  ALKPHOS 107  BILITOT 0.4  PROT 7.6  ALBUMIN 2.6*   No results found for this basename: LIPASE, AMYLASE,  in the last 168 hours No results found for this basename: AMMONIA,  in the last 168  hours CBC:  Recent Labs Lab 02/18/14 1609 02/19/14 0224  WBC 15.8* 15.1*  NEUTROABS 13.9*  --   HGB 12.8 11.4*  HCT 39.7 35.8*  MCV 90.0 90.2  PLT 278 301   Cardiac Enzymes: No results found for this basename: CKTOTAL, CKMB, CKMBINDEX, TROPONINI,  in the last 168 hours BNP (last 3 results)  Recent Labs  08/31/13 1631 02/18/14 1609 02/22/14 0947  PROBNP 16475.0* 1539.0* 2319.0*   CBG:  Recent Labs Lab 02/20/14 0755  GLUCAP 101*    Recent Results (from the past 240 hour(s))  CULTURE, BLOOD (ROUTINE X 2)     Status: None   Collection Time    02/18/14  9:22 PM      Result Value Ref Range Status   Specimen Description BLOOD RIGHT ARM   Final   Special Requests BOTTLES DRAWN AEROBIC AND ANAEROBIC 10CC EACH   Final   Culture  Setup Time     Final   Value: 02/19/2014 03:20     Performed at [facility]   Culture     Final   Value:        BLOOD CULTURE RECEIVED NO GROWTH TO DATE CULTURE WILL BE HELD FOR 5 DAYS BEFORE ISSUING A FINAL NEGATIVE REPORT     Performed at [facility]   Report Status PENDING   Incomplete  CULTURE, BLOOD (ROUTINE X 2)     Status: None   Collection Time    02/18/14  9:28 PM      Result Value Ref Range Status   Specimen Description BLOOD RIGHT FOREARM   Final   Special Requests BOTTLES DRAWN AEROBIC AND ANAEROBIC [facility] EACH   Final   Culture  Setup Time     Final   Value: 02/19/2014 03:19     Performed at [facility]   Culture     Final   Value:        BLOOD CULTURE RECEIVED NO GROWTH TO DATE CULTURE WILL BE HELD FOR 5 DAYS BEFORE ISSUING A FINAL NEGATIVE REPORT     Performed at [facility]   Report Status PENDING   Incomplete  MRSA PCR SCREENING     Status: None   Collection Time    02/18/14 10:34 PM      Result Value Ref Range Status   MRSA by PCR NEGATIVE  NEGATIVE Final   Comment:            The GeneXpert MRSA Assay (FDA     approved for NASAL specimens     only), is one component of a      comprehensive MRSA colonization     surveillance  program. It is not     intended to diagnose MRSA     infection nor to guide or     monitor treatment for     MRSA infections.     Studies: Dg Chest 2 View  02/21/2014   CLINICAL DATA:  Shortness of breath and weakness.  EXAM: CHEST  2 VIEW  COMPARISON:  02/18/2014  FINDINGS: Two views of the chest demonstrates low lung volumes. There are increased densities in both lungs concerning for worsening edema. Heart size appears to be upper limits of normal. Negative for a large pneumothorax.  IMPRESSION: Increased lung densities bilaterally. Findings are concerning for pulmonary edema.   Electronically Signed   By: Markus Daft M.D.   On: 02/21/2014 13:16    Scheduled Meds: . allopurinol  100 mg Oral Daily  . antiseptic oral rinse  7 mL Mouth Rinse q12n4p  . atorvastatin  10 mg Oral Daily  . budesonide (PULMICORT) nebulizer solution  0.5 mg Nebulization BID  . chlorhexidine  15 mL Mouth Rinse BID  . diltiazem  120 mg Oral Daily  . escitalopram  20 mg Oral Daily  . flecainide  100 mg Oral Q12H  . furosemide  80 mg Intravenous Q12H  . guaiFENesin  600 mg Oral BID  . levalbuterol  0.63 mg Nebulization QID  . levofloxacin (LEVAQUIN) IV  750 mg Intravenous Q48H  . metolazone  2.5 mg Oral Daily  . OxyCODONE  10 mg Oral Q12H  . senna-docusate  2 tablet Oral Daily  . warfarin  4 mg Oral ONCE-1800  . Warfarin - Pharmacist Dosing Inpatient   Does not apply q1800   Continuous Infusions:      Time spent: 35 minutes    Cambree Hendrix, [place]  [facility] Pager [phone] If 7PM-7AM, please contact night-coverage at [email or web address], password [facility] 02/22/2014, 2:44 PM  LOS: 4 days

## 2014-02-22 NOTE — Evaluation (Signed)
Physical Therapy Evaluation Patient Details Name: [name] [name] MRN: [id number] DOB: 11/09/1931 Today's Date: 02/22/2014   History of Present Illness  pt presents with Chronic Hypercarbic Resp Failure and CAP.    Clinical Impression  Pt generally weak and debilitated.  Pt on non-rebreather mask and maintained sats 91-93% during mobility, but quickly increases to upper 90's with brief rest.  Pt feels she is having a gout flare up in her L foot and great toe, RN made aware.  Pt has 24hr care at home and wishes to return to home at D/C.  Will continue to follow.      Follow Up Recommendations Home health PT;Supervision/Assistance - 24 hour    Equipment Recommendations  None recommended by PT    Recommendations for Other Services OT consult     Precautions / Restrictions Precautions Precautions: Fall Precaution Comments: pt on non-rebreather mask.   Restrictions Weight Bearing Restrictions: No      Mobility  Bed Mobility Overal bed mobility: Needs Assistance Bed Mobility: Supine to Sit     Supine to sit: Mod assist;HOB elevated     General bed mobility comments: A with bringing trunk up to sitting and scooting hips to EOB.    Transfers Overall transfer level: Needs assistance Equipment used: Rolling walker (2 wheeled);2 person hand held assist Transfers: Sit to/from [employer]GI CorporationStand;Stand Pivot Transfers Sit to Stand: Mod assist;From elevated surface Stand pivot transfers: Mod assist;+2 physical assistance;From elevated surface       General transfer comment: pt able to come to stand briefly with use of RW and one person A, however L foot gout too painful requiring pt to sit.  pt wanting OOB, so utilized 2nd person without RW for stand pivot to recliner.    Ambulation/Gait                Stairs            Wheelchair Mobility    Modified Rankin (Stroke Patients Only)       Balance Overall balance assessment: Needs assistance Sitting-balance support:  Bilateral upper extremity supported;Feet supported Sitting balance-Leahy Scale: Poor Sitting balance - Comments: pt SOB and fatigues quickly requiring pt to prop on UEs for support.     Standing balance support: Bilateral upper extremity supported;During functional activity Standing balance-Leahy Scale: Poor                               Pertinent Vitals/Pain Pain Assessment: 0-10 Pain Score: 8  Pain Location: L Foot and GReat toe Pain Descriptors / Indicators: Sharp Pain Intervention(s): Monitored during session;Patient requesting pain meds-RN notified;Repositioned    Home Living Family/patient expects to be discharged to:: Private residence Living Arrangements: Children Available Help at Discharge: Family;Personal care attendant;Available 24 hours/day Type of Home: House Home Access: Stairs to enter Entrance Stairs-Rails: Left;Right;Can reach both Entrance Stairs-Number of Steps: 2 Home Layout: One level Home Equipment: Shower seat - built in;Grab bars - tub/shower;Walker - 2 wheels;Transport chair;Bedside commode Additional Comments: Son lives with her; has aide and HH daily     Prior Function Level of Independence: Needs assistance   Gait / Transfers Assistance Needed: Uses RW and has lift chair and elevated height bed.    ADL's / Homemaking Assistance Needed: (A) with cooking/cleaning; reports she can dress and bathe herself         Hand Dominance   Dominant Hand: Right    Extremity/Trunk Assessment   Upper  Extremity Assessment: Defer to OT evaluation           Lower Extremity Assessment: Generalized weakness;LLE deficits/detail   LLE Deficits / Details: Gout flare up in L foot and great toe.    Cervical / Trunk Assessment: Kyphotic  Communication   Communication: No difficulties  Cognition Arousal/Alertness: Awake/alert Behavior During Therapy: WFL for tasks assessed/performed Overall Cognitive Status: Within Functional Limits for tasks  assessed                      General Comments      Exercises        Assessment/Plan    PT Assessment Patient needs continued PT services  PT Diagnosis Difficulty walking;Acute pain;Generalized weakness   PT Problem List Decreased strength;Decreased activity tolerance;Decreased balance;Decreased mobility;Decreased knowledge of use of DME;Pain  PT Treatment Interventions DME instruction;Gait training;Stair training;Functional mobility training;Therapeutic activities;Therapeutic exercise;Balance training;Patient/family education   PT Goals (Current goals can be found in the Care Plan section) Acute Rehab PT Goals Patient Stated Goal: Home PT Goal Formulation: With patient Time For Goal Achievement: 03/08/14 Potential to Achieve Goals: Fair    Frequency Min 3X/week   Barriers to discharge        Co-evaluation               End of Session Equipment Utilized During Treatment: Gait belt;Oxygen Activity Tolerance: Patient limited by fatigue Patient left: in chair;with call bell/phone within reach;with family/visitor present;with nursing/sitter in room Nurse Communication: Mobility status;Patient requests pain meds         Time: 0935-1019 PT Time Calculation (min): 44 min   Charges:   PT Evaluation $Initial PT Evaluation Tier I: 1 Procedure PT Treatments $Therapeutic Activity: 23-37 mins   PT G CodesSunny [name]:          [name] [name], [place]PT [phone] 02/22/2014, 11:34 AM

## 2014-02-23 DIAGNOSIS — I369 Nonrheumatic tricuspid valve disorder, unspecified: Secondary | ICD-10-CM

## 2014-02-23 DIAGNOSIS — I509 Heart failure, unspecified: Secondary | ICD-10-CM

## 2014-02-23 LAB — BASIC METABOLIC PANEL
Anion gap: 14 (ref 5–15)
BUN: 78 mg/dL — AB (ref 6–23)
CHLORIDE: 88 meq/L — AB (ref 96–112)
CO2: 36 mEq/L — ABNORMAL HIGH (ref 19–32)
CREATININE: 1.56 mg/dL — AB (ref 0.50–1.10)
Calcium: 9.7 mg/dL (ref 8.4–10.5)
GFR calc non Af Amer: 30 mL/min — ABNORMAL LOW (ref >90)
GFR, EST AFRICAN AMERICAN: 35 mL/min — AB (ref >90)
GLUCOSE: 97 mg/dL (ref 70–99)
Potassium: 3.5 mEq/L — ABNORMAL LOW (ref 3.7–5.3)
Sodium: 138 mEq/L (ref 137–147)

## 2014-02-23 LAB — BLOOD GAS, ARTERIAL
Acid-Base Excess: 16.1 mmol/L — ABNORMAL HIGH (ref 0.0–2.0)
Bicarbonate: 42.5 mEq/L — ABNORMAL HIGH (ref 20.0–24.0)
FIO2: 1 %
O2 CONTENT: 40 L/min
O2 SAT: 93.4 %
PCO2 ART: 79.3 mmHg — AB (ref 35.0–45.0)
PH ART: 7.348 — AB (ref 7.350–7.450)
PO2 ART: 72.2 mmHg — AB (ref 80.0–100.0)
Patient temperature: 98.6
TCO2: 44.9 mmol/L (ref 0–100)

## 2014-02-23 LAB — GLUCOSE, CAPILLARY: Glucose-Capillary: 122 mg/dL — ABNORMAL HIGH (ref 70–99)

## 2014-02-23 LAB — PROTIME-INR
INR: 1.67 — ABNORMAL HIGH (ref 0.00–1.49)
Prothrombin Time: 19.7 seconds — ABNORMAL HIGH (ref 11.6–15.2)

## 2014-02-23 MED ORDER — OXYCODONE HCL 5 MG PO TABS: 5.0000 mg | ORAL_TABLET | Freq: Four times a day (QID) | ORAL | Status: DC | PRN

## 2014-02-23 MED ORDER — POTASSIUM CHLORIDE CRYS ER 20 MEQ PO TBCR
40.0000 meq | EXTENDED_RELEASE_TABLET | Freq: Once | ORAL | Status: AC
  Administered 2014-02-23: 40 meq via ORAL
  Filled 2014-02-23: qty 2

## 2014-02-23 MED ORDER — BISACODYL 5 MG PO TBEC
5.0000 mg | DELAYED_RELEASE_TABLET | Freq: Every day | ORAL | Status: DC
  Administered 2014-02-23 – 2014-02-27 (×5): 5 mg via ORAL
  Filled 2014-02-23 (×6): qty 1

## 2014-02-23 MED ORDER — ACETAMINOPHEN 325 MG PO TABS
650.0000 mg | ORAL_TABLET | Freq: Four times a day (QID) | ORAL | Status: DC | PRN
  Filled 2014-02-23: qty 2

## 2014-02-23 MED ORDER — LEVOFLOXACIN 750 MG PO TABS
750.0000 mg | ORAL_TABLET | Freq: Once | ORAL | Status: AC
  Administered 2014-02-24: 750 mg via ORAL
  Filled 2014-02-23: qty 1

## 2014-02-23 MED ORDER — METHYLPREDNISOLONE SODIUM SUCC 125 MG IJ SOLR
80.0000 mg | Freq: Two times a day (BID) | INTRAMUSCULAR | Status: DC
  Administered 2014-02-23 – 2014-02-26 (×6): 80 mg via INTRAVENOUS
  Filled 2014-02-23 (×4): qty 1.28
  Filled 2014-02-23: qty 2
  Filled 2014-02-23 (×4): qty 1.28

## 2014-02-23 MED ORDER — WARFARIN SODIUM 5 MG PO TABS
5.0000 mg | ORAL_TABLET | Freq: Once | ORAL | Status: AC
  Administered 2014-02-23: 5 mg via ORAL
  Filled 2014-02-23 (×2): qty 1

## 2014-02-23 NOTE — Progress Notes (Signed)
  Echocardiogram 2D Echocardiogram has been performed.  [name] [name] 02/23/2014, 11:09 AM

## 2014-02-23 NOTE — Progress Notes (Signed)
Called to assess patient at bedside for sleepiness.  Patient awake, alert but drowsy.  Jerking of hands noted with periods of intermittent drowsiness.     Plan: BiPAP for 2-3 hours on, then allow for break NPO while on BiPAP Discussed with RN to call if worsening sleepiness or change in mental status.     Canary BrimBrandi Quisha Mabie, NP-C [facility] Pgr: [phone] or [phone]

## 2014-02-23 NOTE — Progress Notes (Signed)
Pt given a break from her bipap.  Pt is more alert and oriented to name and place.  Pt placed on 100% NRB.

## 2014-02-23 NOTE — Progress Notes (Signed)
RT took pt off bipap and placed breathing treatment on her.  At this time during the treatment, her SPO2 dropped and maintained 86%. At this point treatment was stopped and pt was placed on NRB and SPO2 rose 94%.

## 2014-02-23 NOTE — Progress Notes (Addendum)
ANTICOAGULATION CONSULT NOTE   Pharmacy Consult for warfarin Indication: history of PE/ atrial fibrillation  Allergies  Allergen Reactions  . Prednisone     Eyes swelling Pt is not sure if it was prednisone.     Patient Measurements: Height: 5\' 4"  (162.6 cm) Weight: 269 lb 14.4 oz (122.426 kg) IBW/kg (Calculated) : 54.7 Heparin Dosing Weight: 84.2 kg  Vital Signs: Temp: 97.8 F (36.6 C) (10/14 0728) Temp Source: Axillary (10/14 0728) BP: 121/63 mmHg (10/14 0728) Pulse Rate: 66 (10/14 0845)  Labs:  Recent Labs  02/21/14 0422 02/22/14 0251 02/23/14 0246  LABPROT 18.9*  --  19.7*  INR 1.58*  --  1.67*  CREATININE 1.70* 1.83* 1.56*    Estimated Creatinine Clearance: 37.1 ml/min (by C-G formula based on Cr of 1.56).   Medical History: Past Medical History  Diagnosis Date  . Congestive heart failure, unspecified     a. ECHO 6/0: EF 55-60%, mild LVH, mildly dilated RV w. mildly dec fx, RVSP 67  . Chronic atrial fibrillation     failed cardioversion in past  -repeat DC-CV on October 5th, 2010  . Personal history of DVT (deep vein thrombosis) 2008  . Obstructive sleep apnea   . HTN (hypertension)   . Morbid obesity   . Shingles     with postherpetic neuraigia  . GERD (gastroesophageal reflux disease)   . Hyperlipidemia   . Meningitis 1950s  . Gallstones     s/p cholecystectomy  . CAD (coronary artery disease)     nonobstructive by cath 8/10 - LAD 40-50% w mild PAH mean 29 w PVR 3.2 [place]  . AAA (abdominal aortic aneurysm)     small  . PE (pulmonary embolism)     history of  . Anemia     Medications:  PTA Warfarin 1.25 mg on Monday, 2.5 mg on all other days.   Assessment: [name] admitted w/ SOB. On warfarin PTA for atrial fibrillation and history of PE. INR is slighltly subthterapeutic at 1.67 (goal INR 1.8-2.2 per outpatient coumadin clinic).  No bleeding complications noted.  PTA dose: 1.25 mg on Mondays, 2.5 mg on all other days   Goal of Therapy:   Goal INR 1.8-2.2 Monitor platelets by anticoagulation protocol: Yes   Plan:  -Warfarin 5 mg po x1 -Daily INR -Monitor for s/sx bleeding    Agapito GamesAlison Arlen Dupuis, PharmD, BCPS Clinical Pharmacist Pager: [phone] 02/23/2014 10:21 AM     Addendum -Pt is completing course of Levaquin for HCAP, pt has been on levaquin since 10/9, will give one more dose tomorrow to complete 7 day course   Agapito Gameslison Marvie Brevik 02/23/2014

## 2014-02-23 NOTE — Progress Notes (Signed)
[facility] PROGRESS NOTE  [name] [name] BHA:[id number] DOB: [birth year] DOA: 02/18/2014 PCP: Eliezer Lofts, MD Brief narrative 80 y.o. female with Past medical history of chronic diastolic heart failure, obstructive sleep apnea on CPAP, on chr home o2 ( 2.5L [place] ) history of DVT& PE in 2008, chronic A. fib on coumadin , coronary artery disease, dyslipidemia, GERD, chronic lower extremity ulcers presenting with complaints of cough and shortness of breath along with hypoxia. Patient progressively short of breath for past 3-4 weeks.  Patient admitted to SDU with acute on chr hypoxic resp failure secondary to CAP  Assessment/Plan: Acute on chr hypoxic resp failure  Secondary to CAP vs volume overload vs? pneumonitis . possble b/l infiltrate on admission CXR.  empiric abx ( day 5) . order Levaquin per pharmacy. unclear how many doses patient has received.  blood cx, urine strep and legionella all negative.  Scheduled xopenex neb and antitussives. Added Pulmicort neb twice a day. -Patient had worsening  short of breath with followup x-ray showing increased pulmonary edema on 10-12. Elevated proBNP noted. IV Lasix dose to 80 mg twice daily. Place Foley foreclose urine output monitoring. ESR also elevated.  -Check 2-D echo pending.  -appreciate pulmonary evaluation and recommendations.     Encephalopathy: Patient more sleepy. Will check ABG, discontinue schedule narcotics. Check CBG.   Chronic diastolic heart failure.   Resumed zaroxyline. Monitor I/O and daily wts Continue with IV lasix 80 mg BID.  Repeat chest x ray and bnp in am.  Negative 1.7 L.   History of A. fib and PE  INR is subtherapeutic. PE in 2008. Currently in sinus. coumadin dose per pharmacy continue Cardizem and flecainide  Acute gouty arthritis Has swelling and tenderness on left great toe. Has underlying CKD so will not use NSAIDs. Allergic to prednisone.  Elevated uric acid.  We'll apply topical Voltaren gel. Will hold  home dose Oxycodone to avoid oversedation.   CKD stage II  renal function worsened since admission, but at baseline. Monitor while on  IV lasix dose Cr stable, trending down.   Dyslipidemia  cont statin      Code Status: FULL CODE Family Communication: none at bedside today. Will update her son Disposition Plan: remain in the step down.    Consultants:  none  Procedures:  none  Antibiotics:  Levaquin ( 10/9--)  HPI/Subjective: She feels breathing better. Ankle pain has improved.  She is sleepy, wake up to answer question.  np BM since Monday. Dulcolax usually help   Objective: Filed Vitals:   02/23/14 0845  BP:   Pulse: 66  Temp:   Resp: 24    Intake/Output Summary (Last 24 hours) at 02/23/14 1112 Last data filed at 02/23/14 0730  Gross per 24 hour  Intake   1890 ml  Output   1400 ml  Net    490 ml   Filed Weights   02/21/14 0328 02/22/14 0500 02/23/14 0500  Weight: 121.4 kg (267 lb 10.2 oz) 119.2 kg (262 lb 12.6 oz) 122.426 kg (269 lb 14.4 oz)    Exam:   General:  Elderly obese female in no distress.   HEENT:  moist oral mucosa  chest: Coarse breath sounds b/l , no added sounds  CVS: NS1&S2, no murmurs  Abd: soft, NT, ND, BS+  Ext: chr skin graft b/l, no edema, erythema with warmth and swelling over left great toe  , tender to palpation  CNS: sleepy, wake up to answer questions.    Data  Reviewed: Basic Metabolic Panel:  Recent Labs Lab 02/18/14 2128 02/19/14 0224 02/20/14 0236 02/21/14 0422 02/22/14 0251 02/23/14 0246  NA  --  139 135* 136* 138 138  K  --  3.4* 3.6* 4.3 3.8 3.5*  CL  --  91* 89* 90* 89* 88*  CO2  --  31 29 32 33* 36*  GLUCOSE  --  103* 105* 82 92 97  BUN  --  53* 50* 59* 75* 78*  CREATININE  --  1.40* 1.62* 1.70* 1.83* 1.56*  CALCIUM  --  9.5 9.5 9.7 9.6 9.7  MG 2.1  --   --   --   --   --    Liver Function Tests:  Recent Labs Lab 02/19/14 0224  AST 18  ALT 17  ALKPHOS 107  BILITOT 0.4  PROT 7.6   ALBUMIN 2.6*   No results found for this basename: LIPASE, AMYLASE,  in the last 168 hours No results found for this basename: AMMONIA,  in the last 168 hours CBC:  Recent Labs Lab 02/18/14 1609 02/19/14 0224  WBC 15.8* 15.1*  NEUTROABS 13.9*  --   HGB 12.8 11.4*  HCT 39.7 35.8*  MCV 90.0 90.2  PLT 278 301   Cardiac Enzymes: No results found for this basename: CKTOTAL, CKMB, CKMBINDEX, TROPONINI,  in the last 168 hours BNP (last 3 results)  Recent Labs  08/31/13 1631 02/18/14 1609 02/22/14 0947  PROBNP 16475.0* 1539.0* 2319.0*   CBG:  Recent Labs Lab 02/20/14 0755  GLUCAP 101*    Recent Results (from the past 240 hour(s))  CULTURE, BLOOD (ROUTINE X 2)     Status: None   Collection Time    02/18/14  9:22 PM      Result Value Ref Range Status   Specimen Description BLOOD RIGHT ARM   Final   Special Requests BOTTLES DRAWN AEROBIC AND ANAEROBIC 10CC EACH   Final   Culture  Setup Time     Final   Value: 02/19/2014 03:20     Performed at [facility]   Culture     Final   Value:        BLOOD CULTURE RECEIVED NO GROWTH TO DATE CULTURE WILL BE HELD FOR 5 DAYS BEFORE ISSUING A FINAL NEGATIVE REPORT     Performed at [facility]   Report Status PENDING   Incomplete  CULTURE, BLOOD (ROUTINE X 2)     Status: None   Collection Time    02/18/14  9:28 PM      Result Value Ref Range Status   Specimen Description BLOOD RIGHT FOREARM   Final   Special Requests BOTTLES DRAWN AEROBIC AND ANAEROBIC [facility] EACH   Final   Culture  Setup Time     Final   Value: 02/19/2014 03:19     Performed at [facility]   Culture     Final   Value:        BLOOD CULTURE RECEIVED NO GROWTH TO DATE CULTURE WILL BE HELD FOR 5 DAYS BEFORE ISSUING A FINAL NEGATIVE REPORT     Performed at [facility]   Report Status PENDING   Incomplete  MRSA PCR SCREENING     Status: None   Collection Time    02/18/14 10:34 PM      Result Value Ref Range Status   MRSA by PCR  NEGATIVE  NEGATIVE Final   Comment:  The GeneXpert MRSA Assay (FDA     approved for NASAL specimens     only), is one component of a     comprehensive MRSA colonization     surveillance program. It is not     intended to diagnose MRSA     infection nor to guide or     monitor treatment for     MRSA infections.     Studies: Dg Chest 2 View  02/21/2014   CLINICAL DATA:  Shortness of breath and weakness.  EXAM: CHEST  2 VIEW  COMPARISON:  02/18/2014  FINDINGS: Two views of the chest demonstrates low lung volumes. There are increased densities in both lungs concerning for worsening edema. Heart size appears to be upper limits of normal. Negative for a large pneumothorax.  IMPRESSION: Increased lung densities bilaterally. Findings are concerning for pulmonary edema.   Electronically Signed   By: Markus Daft M.D.   On: 02/21/2014 13:16    Scheduled Meds: . allopurinol  100 mg Oral Daily  . antiseptic oral rinse  7 mL Mouth Rinse q12n4p  . atorvastatin  10 mg Oral Daily  . bisacodyl  5 mg Oral Daily  . budesonide (PULMICORT) nebulizer solution  0.5 mg Nebulization BID  . chlorhexidine  15 mL Mouth Rinse BID  . diclofenac sodium  2 g Topical QID  . diltiazem  120 mg Oral Daily  . escitalopram  20 mg Oral Daily  . flecainide  100 mg Oral Q12H  . furosemide  80 mg Intravenous Q12H  . guaiFENesin  600 mg Oral BID  . levalbuterol  0.63 mg Nebulization QID  . metolazone  2.5 mg Oral Daily  . potassium chloride  40 mEq Oral Once  . senna-docusate  2 tablet Oral Daily  . warfarin  5 mg Oral ONCE-1800  . Warfarin - Pharmacist Dosing Inpatient   Does not apply q1800   Continuous Infusions:      Time spent: 35 minutes    Soriya Worster, [place]  [facility] Pager [phone] If 7PM-7AM, please contact night-coverage at [email or web address], password [facility] 02/23/2014, 11:12 AM  LOS: 5 days

## 2014-02-23 NOTE — Progress Notes (Signed)
PULMONARY / CRITICAL CARE MEDICINE   Name: [name] [name] MRN: [id number] DOB: [birth year]    ADMISSION DATE:  02/18/2014 CONSULTATION DATE:  10/11/5   REFERRING MD :  [facility]  CHIEF COMPLAINT:  sob  INITIAL PRESENTATION:   25 yowf never smoker with morbid obesity last seen by Dr Lake Bells 01/19/13 with dx CHF, obesity, prior pulmonary emboli and pulmonary hypertension related to obstructive sleep apnea admit by [facility] 10/9 with one week of worsening resting and noct sob  with dx of OHS/ ? CAP but not better on bipap so pccm svc asked to see am 10/11    SUBJECTIVE:  Increasing O2 requirement. Elevated ESR noted. Cognition appeared to be intact when I evaluated her but RN has noted increased somnolence. ABG noted and is consistent with chronic alveolar hypoventilation  VITAL SIGNS: Temp:  [97.8 F (36.6 C)-98 F (36.7 C)] 97.8 F (36.6 C) (10/14 1132) Pulse Rate:  [62-72] 64 (10/14 1132) Resp:  [16-31] 23 (10/14 1132) BP: (117-129)/(40-63) 128/59 mmHg (10/14 1132) SpO2:  [90 %-100 %] 91 % (10/14 1250) FiO2 (%):  [100 %] 100 % (10/14 1250) Weight:  [122.426 kg (269 lb 14.4 oz)] 122.426 kg (269 lb 14.4 oz) (10/14 0500) HEMODYNAMICS:   VENTILATOR SETTINGS: Vent Mode:  [-]  FiO2 (%):  [100 %] 100 % INTAKE / OUTPUT:  Intake/Output Summary (Last 24 hours) at 02/23/14 1418 Last data filed at 02/23/14 1137  Gross per 24 hour  Intake   1890 ml  Output   2125 ml  Net   -235 ml    PHYSICAL EXAMINATION: General:  NAD, RASS 0 No focal deficits HEENT WNL JVP not well visualized Faint bibasilar crackles RRR s M Abd obese, soft, +BS Ext warm with no edema   LABS: I have reviewed all of today's lab results. Relevant abnormalities are discussed in the A/P section  CXR: severe kyphosis, interstitial prominence, cardiomegaly    ASSESSMENT: Acute on (mostly) chronic hypercarbic resp failure Acute on chronic hypoxic resp failure - out of proportion to CXR findings  Doubt PE as pt is  anticoagulated  Consider possibility of R>L shunt (consider repeat Echo with bubble study) Wheezing, resolved Vague IS prominence - edema vs pneumonitis Markedly elevated ESR of unclear etiology OHS PAH - confirmed on Echo 10/14  PLAN: Cont current Rx Begin trial of systemic steroids for possible pneumonitis 10/14 Cont nebulized BDs and steroids She is a very poor candidate for intubation or ACLS - would address EOL issues or request Palliative Care involvement   Merton Border, MD ; [facility] service Mobile [phone].  After 5:30 PM or weekends, call [phone]

## 2014-02-23 NOTE — Progress Notes (Signed)
Dr Sung AmabileSimonds made aware of high PCO2 79.31. He said no bipap at this time.

## 2014-02-24 ENCOUNTER — Inpatient Hospital Stay (HOSPITAL_COMMUNITY): Payer: Medicare Other

## 2014-02-24 DIAGNOSIS — R0600 Dyspnea, unspecified: Secondary | ICD-10-CM

## 2014-02-24 DIAGNOSIS — K5900 Constipation, unspecified: Secondary | ICD-10-CM

## 2014-02-24 DIAGNOSIS — I270 Primary pulmonary hypertension: Secondary | ICD-10-CM

## 2014-02-24 DIAGNOSIS — Z515 Encounter for palliative care: Secondary | ICD-10-CM

## 2014-02-24 DIAGNOSIS — R0689 Other abnormalities of breathing: Secondary | ICD-10-CM

## 2014-02-24 LAB — PRO B NATRIURETIC PEPTIDE: Pro B Natriuretic peptide (BNP): 2410 pg/mL — ABNORMAL HIGH (ref 0–450)

## 2014-02-24 LAB — EXPECTORATED SPUTUM ASSESSMENT W REFEX TO RESP CULTURE

## 2014-02-24 LAB — EXPECTORATED SPUTUM ASSESSMENT W GRAM STAIN, RFLX TO RESP C

## 2014-02-24 LAB — CBC
HCT: 37.4 % (ref 36.0–46.0)
HEMOGLOBIN: 11.9 g/dL — AB (ref 12.0–15.0)
MCH: 29 pg (ref 26.0–34.0)
MCHC: 31.8 g/dL (ref 30.0–36.0)
MCV: 91 fL (ref 78.0–100.0)
Platelets: 327 10*3/uL (ref 150–400)
RBC: 4.11 MIL/uL (ref 3.87–5.11)
RDW: 15.7 % — ABNORMAL HIGH (ref 11.5–15.5)
WBC: 9.8 10*3/uL (ref 4.0–10.5)

## 2014-02-24 LAB — GLUCOSE, CAPILLARY: GLUCOSE-CAPILLARY: 137 mg/dL — AB (ref 70–99)

## 2014-02-24 LAB — BASIC METABOLIC PANEL
Anion gap: 11 (ref 5–15)
BUN: 73 mg/dL — AB (ref 6–23)
CO2: 43 mEq/L (ref 19–32)
Calcium: 10.5 mg/dL (ref 8.4–10.5)
Chloride: 90 mEq/L — ABNORMAL LOW (ref 96–112)
Creatinine, Ser: 1.21 mg/dL — ABNORMAL HIGH (ref 0.50–1.10)
GFR calc Af Amer: 48 mL/min — ABNORMAL LOW (ref >90)
GFR, EST NON AFRICAN AMERICAN: 41 mL/min — AB (ref >90)
Glucose, Bld: 137 mg/dL — ABNORMAL HIGH (ref 70–99)
Potassium: 3.4 mEq/L — ABNORMAL LOW (ref 3.7–5.3)
Sodium: 144 mEq/L (ref 137–147)

## 2014-02-24 LAB — PROTIME-INR
INR: 1.74 — ABNORMAL HIGH (ref 0.00–1.49)
Prothrombin Time: 20.5 seconds — ABNORMAL HIGH (ref 11.6–15.2)

## 2014-02-24 MED ORDER — POTASSIUM CHLORIDE CRYS ER 20 MEQ PO TBCR
40.0000 meq | EXTENDED_RELEASE_TABLET | Freq: Once | ORAL | Status: AC
  Administered 2014-02-24: 40 meq via ORAL
  Filled 2014-02-24: qty 2

## 2014-02-24 MED ORDER — POLYETHYLENE GLYCOL 3350 17 G PO PACK
17.0000 g | PACK | Freq: Every day | ORAL | Status: DC
  Administered 2014-02-24 – 2014-02-25 (×2): 17 g via ORAL
  Filled 2014-02-24 (×3): qty 1

## 2014-02-24 MED ORDER — SENNOSIDES-DOCUSATE SODIUM 8.6-50 MG PO TABS
2.0000 | ORAL_TABLET | Freq: Two times a day (BID) | ORAL | Status: DC
  Administered 2014-02-25 – 2014-02-27 (×7): 2 via ORAL
  Filled 2014-02-24 (×9): qty 2

## 2014-02-24 MED ORDER — WARFARIN SODIUM 5 MG PO TABS
5.0000 mg | ORAL_TABLET | Freq: Once | ORAL | Status: AC
  Administered 2014-02-24: 5 mg via ORAL
  Filled 2014-02-24: qty 1

## 2014-02-24 MED ORDER — BISACODYL 10 MG RE SUPP
10.0000 mg | Freq: Every day | RECTAL | Status: DC | PRN
  Administered 2014-02-26: 10 mg via RECTAL
  Filled 2014-02-24: qty 1

## 2014-02-24 NOTE — Consult Note (Signed)
Patient GN:FAOZH:[name] [name]      DOB: 03/05/1932      YQM:[id number]     Consult Note from the Palliative Medicine Team at [facility]    Consult Requested by: Dr Sunnie Nielsenegalado     PCP: [name] NoraAmy Bedsole, MD Reason for Consultation:Goals of Care    Phone Number:[phone]  Assessment/Recommendations: 80 yo female with PMHx of PE/DVT, OSA, Obesity, diastolic HF, Afib, CAD, chronic resp failure who presented with hypoxia and dyspnea.    1.  Code Status: Full  2. Goals of Care: I am not sure how much insight [name] [name] has into her illness.  Sounds like complicated family situation. One of her sons is a Development worker, communityphysician.  He lives [place] of [place] and she has not seen him in over a year. When I asked her if I should call him she says no.  She wants me to only talk to her son [name] [name] or his wife.  She also has a Financial risk analystdughter [name] [name] who would be okay to talk to as a backup. She filled out HCPOA today to indicate [name] [name] should make medical decisions should she not be able to.  I spoke at length over phone with [name] [name] today.  He was very suspicious of palliative care involvement.  It sounds like his concern is that she was doing poorly and we were consulted to talk about hospice. He is able to verbalize that she is getting older and has been coming to hospital more frequently but also feels she could bounce back. We talked about issues with Pulm HTN and likely comorbid conditions contributing.  He is curious to see how she does here but at least acknowledges she is high risk for decline.  We did not discuss code status and I think my doing so would only cause more mistrust of Palliative involvement. I encourage other providers to have this conversation as we certainly do not need to initiate or lead these conversations.  I will continue to work with patient/family to help cope with illness and establish goals as they allow.   3. Symptom Management:   1. Hypoxic/Hypercapnic Resp Failure with dyspnea- Multifactorial as previously noted.  OSA,  obesity, Diastolic HF, h/o PE all likely contributing.  Ongoing diuresis. Appears to be doing a little better. Oxycontin could have made hypercapnea worse if it made her overly sedated with other acute issues.  Regardless she is not having pain or dyspnea without opioids. If need to restart would try non-opioid such as tylenol. If needing opioid for pain control would start with low dose PRN oxycodone 2.5-5mg .   2. Constipation- No BM since 10/8. On bisacodyl 5mg  daily and miralax. Has not taken miralax. I will add stool softener doc/senna.  Bisacodyl supp PRN.   4. Psychosocial/Spiritual: Close with Son [name] [name] and his wife as well as daughter [name] [name]. Lives at home. She prefers we do not call son [name] [name] who is a physician.   Brief HPI: 80 yo female with PMHx of PE/DVT, Diastolic Dysfunction, OSA on CPAP, Afib/CAD.  She is chronically O2 dpendent at home and presented with hypoxia as well as dyspnea.  Normally uses 2.5L of O2 at home and had to increase to 4L. Sounds like this developed over the course of 1 week.  CXR on admission concerning for possible PNA and she was started on ABX.  On diuretics at home for CHF which were continued.  Cultures negative and hypoxia continued to be issue with noted pulm edema on repeat CXR.  She required  intermittent BiPAP here.  Echo revealed severe pulm HTN.  BNP elevated and diuresis increased here. She is feeling a bit better today and is on NRB.  She diuresed over 2L in past 24 hours. She denies any pain or SOB.  Not able to eat as NPO.  Only complaint is constipation and feels like her last BM was Friday. Family feels like her encephalopathy is improving.    ROS: Full ROS negative unless otherwise mentioned above     PMH:  Past Medical History  Diagnosis Date  . Congestive heart failure, unspecified     a. ECHO 6/0: EF 55-60%, mild LVH, mildly dilated RV w. mildly dec fx, RVSP 67  . Chronic atrial fibrillation     failed cardioversion in past  -repeat DC-CV on  October 5th, 2010  . Personal history of DVT (deep vein thrombosis) 2008  . Obstructive sleep apnea   . HTN (hypertension)   . Morbid obesity   . Shingles     with postherpetic neuraigia  . GERD (gastroesophageal reflux disease)   . Hyperlipidemia   . Meningitis 1950s  . Gallstones     s/p cholecystectomy  . CAD (coronary artery disease)     nonobstructive by cath 8/10 - LAD 40-50% w mild PAH mean 29 w PVR 3.2 [place]  . AAA (abdominal aortic aneurysm)     small  . PE (pulmonary embolism)     history of  . Anemia      PSH: Past Surgical History  Procedure Laterality Date  . Appendectomy  1947  . Tonsillectomy  1946  . Hospitalized with meningitis at [place]  1960  . Cholecystectomy    . Cardioversion  01/2007    x2   I have reviewed the FH and SH and  If appropriate update it with new information. Allergies  Allergen Reactions  . Prednisone     Eyes swelling Pt is not sure if it was prednisone.    Scheduled Meds: . allopurinol  100 mg Oral Daily  . antiseptic oral rinse  7 mL Mouth Rinse q12n4p  . atorvastatin  10 mg Oral Daily  . bisacodyl  5 mg Oral Daily  . budesonide (PULMICORT) nebulizer solution  0.5 mg Nebulization BID  . chlorhexidine  15 mL Mouth Rinse BID  . diclofenac sodium  2 g Topical QID  . diltiazem  120 mg Oral Daily  . escitalopram  20 mg Oral Daily  . flecainide  100 mg Oral Q12H  . furosemide  80 mg Intravenous Q12H  . levalbuterol  0.63 mg Nebulization QID  . levofloxacin  750 mg Oral Once  . methylPREDNISolone (SOLU-MEDROL) injection  80 mg Intravenous Q12H  . metolazone  2.5 mg Oral Daily  . polyethylene glycol  17 g Oral Daily  . potassium chloride  40 mEq Oral Once  . senna-docusate  2 tablet Oral Daily  . warfarin  5 mg Oral ONCE-1800  . Warfarin - Pharmacist Dosing Inpatient   Does not apply q1800   Continuous Infusions:  PRN Meds:.acetaminophen    BP 107/51  Pulse 64  Temp(Src) 96.8 F (36 C) (Axillary)  Resp 25  Ht  5\' 4"  (1.626 m)  Wt 122 kg (268 lb 15.4 oz)  BMI 46.14 kg/m2  SpO2 97%   PPS: Currently 30   Intake/Output Summary (Last 24 hours) at 02/24/14 1312 Last data filed at 02/24/14 1208  Gross per 24 hour  Intake    410 ml  Output  3340 ml  Net  -2930 ml    Physical Exam:  General: Alert, on NRB, NAD, conversant HEENT:  [place], sclera anicteric Neck: supple Chest:   CTAB, symm exp CVS: RRR Abdomen: obese, soft, NT Ext: no edema Skin: warm/dry Psych: appropriate mood/affect MSK: moves all ext  Labs: CBC    Component Value Date/Time   WBC 9.8 02/24/2014 0900   RBC 4.11 02/24/2014 0900   HGB 11.9* 02/24/2014 0900   HCT 37.4 02/24/2014 0900   PLT 327 02/24/2014 0900   MCV 91.0 02/24/2014 0900   MCH 29.0 02/24/2014 0900   MCHC 31.8 02/24/2014 0900   RDW 15.7* 02/24/2014 0900   LYMPHSABS 0.8 02/18/2014 1609   MONOABS 1.0 02/18/2014 1609   EOSABS 0.1 02/18/2014 1609   BASOSABS 0.0 02/18/2014 1609    BMET    Component Value Date/Time   NA 144 02/24/2014 0900   NA 135 12/28/2013 1457   K 3.4* 02/24/2014 0900   CL 90* 02/24/2014 0900   CO2 43* 02/24/2014 0900   GLUCOSE 137* 02/24/2014 0900   GLUCOSE 91 12/28/2013 1457   BUN 73* 02/24/2014 0900   BUN 36* 12/28/2013 1457   CREATININE 1.21* 02/24/2014 0900   CREATININE 1.65* 09/17/2013 1646   CALCIUM 10.5 02/24/2014 0900   GFRNONAA 41* 02/24/2014 0900   GFRAA 48* 02/24/2014 0900    CMP     Component Value Date/Time   NA 144 02/24/2014 0900   NA 135 12/28/2013 1457   K 3.4* 02/24/2014 0900   CL 90* 02/24/2014 0900   CO2 43* 02/24/2014 0900   GLUCOSE 137* 02/24/2014 0900   GLUCOSE 91 12/28/2013 1457   BUN 73* 02/24/2014 0900   BUN 36* 12/28/2013 1457   CREATININE 1.21* 02/24/2014 0900   CREATININE 1.65* 09/17/2013 1646   CALCIUM 10.5 02/24/2014 0900   PROT 7.6 02/19/2014 0224   ALBUMIN 2.6* 02/19/2014 0224   AST 18 02/19/2014 0224   ALT 17 02/19/2014 0224   ALKPHOS 107 02/19/2014 0224   BILITOT 0.4 02/19/2014 0224    GFRNONAA 41* 02/24/2014 0900   GFRAA 48* 02/24/2014 0900   1015 CXR IMPRESSION:  Evidence of a degree of congestive heart failure, stable.  Consolidation the left base may represent alveolar edema or  superimposed pneumonia. The appearance is stable compared to recent  prior study.   [id number] Echo Impressions: - The right ventricular systolic pressure was increased consistent with severe pulmonary hypertension.  Total Time: 100 miuntes  Greater than 50%  of this time was spent counseling and coordinating care related to the above assessment and plan.   Orvis Brill D.O. Palliative Medicine Team at [facility]  Pager: [phone] Team Phone: [phone]

## 2014-02-24 NOTE — Progress Notes (Addendum)
[facility] PROGRESS NOTE  [name] [name] JHE:[id number] DOB: 1932-03-03 DOA: 02/18/2014 PCP: Eliezer Lofts, MD Brief narrative 80 y.o. female with Past medical history of chronic diastolic heart failure, obstructive sleep apnea on CPAP, on chr home o2 ( 2.5L [place] ) history of DVT& PE in 2008, chronic A. fib on coumadin , coronary artery disease, dyslipidemia, GERD, chronic lower extremity ulcers presenting with complaints of cough and shortness of breath along with hypoxia. Patient progressively short of breath for past 3-4 weeks.  Patient admitted to SDU with acute on chr hypoxic resp failure secondary to CAP  Assessment/Plan: Acute on chr hypoxic resp failure  -Secondary to CAP vs volume overload vs? Pneumonitis, pulmonary HTN.  [name] possble b/l infiltrate on admission CXR. -Blood cx, urine strep and legionella all negative.  - Scheduled xopenex neb and antitussives. Added Pulmicort neb twice a day. -Patient had worsening  short of breath with followup x-ray showing increased pulmonary edema on 10-12. Elevated proBNP noted. ESR also elevated.  -Repeat Check 2-D echo with bubble study as recommended by pulmonary.  -ECHO with severe pulmonary Hypertension.  -appreciate pulmonary evaluation and recommendations.   -Continue with lasix 80 mg BID.  -To Complete Levaquin for 7 day 9-15.  -Started on Solumedrol 80 Mg BID on 1-14.   Encephalopathy: Multifactorial, opioids, Vs hypercapnia.  Patient more alert today.   Acute on Chronic diastolic heart failure exacerbation.  Continue with zaroxyline. Monitor I/O and daily wts Continue with IV lasix 80 mg BID. Adjust lasix depending on chest x ray and B-met.  Repeat chest x ray pending for this morning.  Negative 3 L.   History of A. fib and PE PE in 2008. Currently in sinus. coumadin dose per pharmacy continue Cardizem and flecainide  Acute gouty arthritis Has swelling and tenderness on left great toe. Has underlying CKD so will not use  NSAIDs. Allergic to prednisone.  Elevated uric acid.  We'll apply topical Voltaren gel. Will hold home dose Oxycodone to avoid oversedation.   CKD stage III Cr per prior records at 1.4 to 1.7. Monitor while on  IV lasix dose -Cr stable, trending down.  -Cr pending for this morning.   Dyslipidemia  cont statin      Code Status: FULL CODE Family Communication: none at bedside today. Son, [name] updated 10-14,  Disposition Plan: remain in the step down.    Consultants:  none  Procedures:  none  Antibiotics:  Levaquin ( 10/9--)  HPI/Subjective: She is more alert today, she is following command.  No BM yet.  Respiratory therapy try to remove BIPAP this morning but patient de sat 80.   Objective: Filed Vitals:   02/24/14 0838  BP:   Pulse: 57  Temp:   Resp: 22    Intake/Output Summary (Last 24 hours) at 02/24/14 0908 Last data filed at 02/24/14 0815  Gross per 24 hour  Intake    410 ml  Output   3515 ml  Net  -3105 ml   Filed Weights   02/22/14 0500 02/23/14 0500 02/24/14 0434  Weight: 119.2 kg (262 lb 12.6 oz) 122.426 kg (269 lb 14.4 oz) 122 kg (268 lb 15.4 oz)    Exam:   General:  Elderly obese female on BIPAP.   HEENT:  moist oral mucosa  chest: Coarse breath sounds b/l , no added sounds  CVS: NS1&S2, no murmurs  Abd: soft, NT, ND, BS+  Ext: chr skin graft b/l, no edema, erythema with warmth and swelling over left  great toe  , tender to palpation  CNS: she is more alert today, following commands.    Data Reviewed: Basic Metabolic Panel:  Recent Labs Lab 02/18/14 2128 02/19/14 0224 02/20/14 0236 02/21/14 0422 02/22/14 0251 02/23/14 0246  NA  --  139 135* 136* 138 138  K  --  3.4* 3.6* 4.3 3.8 3.5*  CL  --  91* 89* 90* 89* 88*  CO2  --  31 29 32 33* 36*  GLUCOSE  --  103* 105* 82 92 97  BUN  --  53* 50* 59* 75* 78*  CREATININE  --  1.40* 1.62* 1.70* 1.83* 1.56*  CALCIUM  --  9.5 9.5 9.7 9.6 9.7  MG 2.1  --   --   --   --   --     Liver Function Tests:  Recent Labs Lab 02/19/14 0224  AST 18  ALT 17  ALKPHOS 107  BILITOT 0.4  PROT 7.6  ALBUMIN 2.6*   No results found for this basename: LIPASE, AMYLASE,  in the last 168 hours No results found for this basename: AMMONIA,  in the last 168 hours CBC:  Recent Labs Lab 02/18/14 1609 02/19/14 0224  WBC 15.8* 15.1*  NEUTROABS 13.9*  --   HGB 12.8 11.4*  HCT 39.7 35.8*  MCV 90.0 90.2  PLT 278 301   Cardiac Enzymes: No results found for this basename: CKTOTAL, CKMB, CKMBINDEX, TROPONINI,  in the last 168 hours BNP (last 3 results)  Recent Labs  02/18/14 1609 02/22/14 0947 02/24/14 0332  PROBNP 1539.0* 2319.0* 2410.0*   CBG:  Recent Labs Lab 02/20/14 0755 02/23/14 1134  GLUCAP 101* 122*    Recent Results (from the past 240 hour(s))  CULTURE, BLOOD (ROUTINE X 2)     Status: None   Collection Time    02/18/14  9:22 PM      Result Value Ref Range Status   Specimen Description BLOOD RIGHT ARM   Final   Special Requests BOTTLES DRAWN AEROBIC AND ANAEROBIC 10CC EACH   Final   Culture  Setup Time     Final   Value: 02/19/2014 03:20     Performed at [facility]   Culture     Final   Value:        BLOOD CULTURE RECEIVED NO GROWTH TO DATE CULTURE WILL BE HELD FOR 5 DAYS BEFORE ISSUING A FINAL NEGATIVE REPORT     Performed at [facility]   Report Status PENDING   Incomplete  CULTURE, BLOOD (ROUTINE X 2)     Status: None   Collection Time    02/18/14  9:28 PM      Result Value Ref Range Status   Specimen Description BLOOD RIGHT FOREARM   Final   Special Requests BOTTLES DRAWN AEROBIC AND ANAEROBIC [facility] EACH   Final   Culture  Setup Time     Final   Value: 02/19/2014 03:19     Performed at [facility]   Culture     Final   Value:        BLOOD CULTURE RECEIVED NO GROWTH TO DATE CULTURE WILL BE HELD FOR 5 DAYS BEFORE ISSUING A FINAL NEGATIVE REPORT     Performed at [facility]   Report Status PENDING    Incomplete  MRSA PCR SCREENING     Status: None   Collection Time    02/18/14 10:34 PM      Result Value Ref Range  Status   MRSA by PCR NEGATIVE  NEGATIVE Final   Comment:            The GeneXpert MRSA Assay (FDA     approved for NASAL specimens     only), is one component of a     comprehensive MRSA colonization     surveillance program. It is not     intended to diagnose MRSA     infection nor to guide or     monitor treatment for     MRSA infections.     Studies: No results found.  Scheduled Meds: . allopurinol  100 mg Oral Daily  . antiseptic oral rinse  7 mL Mouth Rinse q12n4p  . atorvastatin  10 mg Oral Daily  . bisacodyl  5 mg Oral Daily  . budesonide (PULMICORT) nebulizer solution  0.5 mg Nebulization BID  . chlorhexidine  15 mL Mouth Rinse BID  . diclofenac sodium  2 g Topical QID  . diltiazem  120 mg Oral Daily  . escitalopram  20 mg Oral Daily  . flecainide  100 mg Oral Q12H  . furosemide  80 mg Intravenous Q12H  . levalbuterol  0.63 mg Nebulization QID  . levofloxacin  750 mg Oral Once  . methylPREDNISolone (SOLU-MEDROL) injection  80 mg Intravenous Q12H  . metolazone  2.5 mg Oral Daily  . senna-docusate  2 tablet Oral Daily  . Warfarin - Pharmacist Dosing Inpatient   Does not apply q1800   Continuous Infusions:      Time spent: 35 minutes    Tyrina Hines, [place]  [facility] Pager [phone] If 7PM-7AM, please contact night-coverage at [email or web address], password [facility] 02/24/2014, 9:08 AM  LOS: 6 days

## 2014-02-24 NOTE — Progress Notes (Signed)
Placed patient on BIPAP 14/6, 75% 02. Patient is tolerating well at this time.

## 2014-02-24 NOTE — Progress Notes (Signed)
PULMONARY / CRITICAL CARE MEDICINE   Name: [name] [name] MRN: [id number] DOB: 1931/05/31    ADMISSION DATE:  02/18/2014 CONSULTATION DATE:  10/11/5   REFERRING MD :  [facility]  CHIEF COMPLAINT:  sob  INITIAL PRESENTATION:   54 yowf obese never smoker with  last seen by Dr Lake Bells 01/19/13 with dx CHF, obesity, prior pulmonary emboli and pulmonary hypertension related to obstructive sleep apnea admit by [facility] 10/9 with one week of worsening resting and noct sob  with dx of OHS/ ? CAP but not better on bipap so pccm svc asked to see am 10/11 PMH - DVT on the left and PE in 6962, diastolic HF on chronic diuretics She uses CPAP at 7- 10 cm of water every night. At baseline she is on 2.5 L of oxygen continuously, 4L on exertion.      SUBJECTIVE:  On bipap -desatn when transitioned to ventimask diuresed well   VITAL SIGNS: Temp:  [97 F (36.1 C)-98 F (36.7 C)] 97.8 F (36.6 C) (10/15 0700) Pulse Rate:  [54-77] 58 (10/15 1124) Resp:  [14-32] 23 (10/15 1124) BP: (111-135)/(48-66) 114/53 mmHg (10/15 1124) SpO2:  [91 %-97 %] 93 % (10/15 1124) FiO2 (%):  [75 %-100 %] 96 % (10/15 0836) Weight:  [122 kg (268 lb 15.4 oz)] 122 kg (268 lb 15.4 oz) (10/15 0434) HEMODYNAMICS:   VENTILATOR SETTINGS: Vent Mode:  [-]  FiO2 (%):  [75 %-100 %] 96 % INTAKE / OUTPUT:  Intake/Output Summary (Last 24 hours) at 02/24/14 1133 Last data filed at 02/24/14 0815  Gross per 24 hour  Intake    410 ml  Output   3515 ml  Net  -3105 ml    PHYSICAL EXAMINATION: General:  NAD, RASS 0 No focal deficits HEENT WNL JVP not well visualized Faint bibasilar crackles RRR s M Abd obese, soft, +BS Ext warm with no edema   LABS: I have reviewed all of today's lab results. Relevant abnormalities are discussed in the A/P section  CXR: severe kyphosis, interstitial prominence, cardiomegaly    ASSESSMENT: Acute on (mostly) chronic hypercarbic resp failure Acute on chronic hypoxic resp failure - out of  proportion to CXR findings  Doubt PE as pt is anticoagulated  Consider possibility of R>L shunt (consider repeat Echo with bubble study) Wheezing, resolved Vague IS prominence - edema vs pneumonitis Markedly elevated ESR of unclear etiology OHS PAH - confirmed on Echo 10/14  PLAN: Cont diuresis -lasix 80 q 12h  trial of systemic steroids for possible pneumonitis 10/14 -empiric Cont nebulized BDs and steroids Cycle bipap today - ok to give breaks x 4h She is a very poor candidate for intubation or ACLS - updated son who is a family physician & will continue this discussion when he arrives on 10/16   Kara Mead MD. FCCP. [facility] Pager [phone] If no response call [phone]

## 2014-02-24 NOTE — Progress Notes (Signed)
ANTICOAGULATION CONSULT NOTE   Pharmacy Consult for warfarin Indication: history of PE/ atrial fibrillation  Allergies  Allergen Reactions  . Prednisone     Eyes swelling Pt is not sure if it was prednisone.     Patient Measurements: Height: 5\' 4"  (162.6 cm) Weight: 268 lb 15.4 oz (122 kg) IBW/kg (Calculated) : 54.7 Heparin Dosing Weight: 84.2 kg  Vital Signs: Temp: 97.8 F (36.6 C) (10/15 0700) Temp Source: Axillary (10/15 0700) BP: 114/53 mmHg (10/15 1124) Pulse Rate: 58 (10/15 1124)  Labs:  Recent Labs  02/22/14 0251 02/23/14 0246 02/24/14 0332 02/24/14 0900  HGB  --   --   --  11.9*  HCT  --   --   --  37.4  PLT  --   --   --  327  LABPROT  --  19.7* 20.5*  --   INR  --  1.67* 1.74*  --   CREATININE 1.83* 1.56*  --  1.21*    Estimated Creatinine Clearance: 47.8 ml/min (by C-G formula based on Cr of 1.21).   Medical History: Past Medical History  Diagnosis Date  . Congestive heart failure, unspecified     a. ECHO 6/0: EF 55-60%, mild LVH, mildly dilated RV w. mildly dec fx, RVSP 67  . Chronic atrial fibrillation     failed cardioversion in past  -repeat DC-CV on October 5th, 2010  . Personal history of DVT (deep vein thrombosis) 2008  . Obstructive sleep apnea   . HTN (hypertension)   . Morbid obesity   . Shingles     with postherpetic neuraigia  . GERD (gastroesophageal reflux disease)   . Hyperlipidemia   . Meningitis 1950s  . Gallstones     s/p cholecystectomy  . CAD (coronary artery disease)     nonobstructive by cath 8/10 - LAD 40-50% w mild PAH mean 29 w PVR 3.2 [place]  . AAA (abdominal aortic aneurysm)     small  . PE (pulmonary embolism)     history of  . Anemia     Medications:  PTA Warfarin 1.25 mg on Monday, 2.5 mg on all other days.   Assessment: [name] admitted w/ SOB. On warfarin PTA for atrial fibrillation and history of PE. INR is slighltly subthterapeutic at 1.74 (goal INR 1.8-2.2 per outpatient coumadin clinic).  No  bleeding complications noted.  PTA dose: 1.25 mg on Mondays, 2.5 mg on all other days    Goal of Therapy:  Goal INR 1.8-2.2 Monitor platelets by anticoagulation protocol: Yes    Plan:  -Warfarin 5 mg po x1 -Daily INR -Monitor for s/sx bleeding   Agapito GamesAlison Ruthmary [name], PharmD, BCPS Clinical Pharmacist Pager: [phone] 02/24/2014 11:43 AM

## 2014-02-24 NOTE — Clinical Social Work Note (Signed)
CSW provided patient and patients caretaker, Toni AmendCourtney, with advanced directive paperwork.  CSW coordinated for notary to finalize the paperwork.  CSW signing off.  Merlyn LotJenna Holoman, LCSWA Clinical Social Worker [phone]

## 2014-02-24 NOTE — Progress Notes (Signed)
PT Cancellation Note  Patient Details Name: Aneta Minseggy T Revard MRN: [id number] DOB: 02/20/1932   Cancelled Treatment:    Reason Eval/Treat Not Completed: Medical issues which prohibited therapy.  Pt currently on Bipap with increased respiratory difficulties since last PT session.  Will hold PT at this time and f/u another time.     Jenniffer Vessels, Alison MurrayMegan F 02/24/2014, 7:53 AM

## 2014-02-25 ENCOUNTER — Ambulatory Visit: Payer: Medicare Other | Admitting: Cardiovascular Disease

## 2014-02-25 DIAGNOSIS — N179 Acute kidney failure, unspecified: Secondary | ICD-10-CM

## 2014-02-25 DIAGNOSIS — N189 Chronic kidney disease, unspecified: Secondary | ICD-10-CM

## 2014-02-25 LAB — BASIC METABOLIC PANEL
ANION GAP: 11 (ref 5–15)
BUN: 83 mg/dL — AB (ref 6–23)
CO2: 43 meq/L — AB (ref 19–32)
Calcium: 10.3 mg/dL (ref 8.4–10.5)
Chloride: 88 mEq/L — ABNORMAL LOW (ref 96–112)
Creatinine, Ser: 1.2 mg/dL — ABNORMAL HIGH (ref 0.50–1.10)
GFR calc non Af Amer: 42 mL/min — ABNORMAL LOW (ref >90)
GFR, EST AFRICAN AMERICAN: 48 mL/min — AB (ref >90)
GLUCOSE: 132 mg/dL — AB (ref 70–99)
POTASSIUM: 3.9 meq/L (ref 3.7–5.3)
Sodium: 142 mEq/L (ref 137–147)

## 2014-02-25 LAB — CBC
HCT: 38.1 % (ref 36.0–46.0)
HEMOGLOBIN: 11.9 g/dL — AB (ref 12.0–15.0)
MCH: 28.5 pg (ref 26.0–34.0)
MCHC: 31.2 g/dL (ref 30.0–36.0)
MCV: 91.1 fL (ref 78.0–100.0)
Platelets: 370 10*3/uL (ref 150–400)
RBC: 4.18 MIL/uL (ref 3.87–5.11)
RDW: 16 % — ABNORMAL HIGH (ref 11.5–15.5)
WBC: 15.2 10*3/uL — ABNORMAL HIGH (ref 4.0–10.5)

## 2014-02-25 LAB — CULTURE, BLOOD (ROUTINE X 2)
Culture: NO GROWTH
Culture: NO GROWTH

## 2014-02-25 LAB — GLUCOSE, CAPILLARY
GLUCOSE-CAPILLARY: 142 mg/dL — AB (ref 70–99)
GLUCOSE-CAPILLARY: 278 mg/dL — AB (ref 70–99)
Glucose-Capillary: 170 mg/dL — ABNORMAL HIGH (ref 70–99)
Glucose-Capillary: 161 mg/dL — ABNORMAL HIGH (ref 70–99)

## 2014-02-25 LAB — PROTIME-INR
INR: 1.94 — AB (ref 0.00–1.49)
Prothrombin Time: 22.3 seconds — ABNORMAL HIGH (ref 11.6–15.2)

## 2014-02-25 MED ORDER — POLYETHYLENE GLYCOL 3350 17 G PO PACK
17.0000 g | PACK | Freq: Two times a day (BID) | ORAL | Status: DC
  Administered 2014-02-25 – 2014-02-27 (×5): 17 g via ORAL
  Filled 2014-02-25 (×7): qty 1

## 2014-02-25 MED ORDER — WARFARIN SODIUM 4 MG PO TABS
4.0000 mg | ORAL_TABLET | Freq: Once | ORAL | Status: AC
  Administered 2014-02-25: 4 mg via ORAL
  Filled 2014-02-25: qty 1

## 2014-02-25 MED ORDER — ENSURE COMPLETE PO LIQD
237.0000 mL | Freq: Two times a day (BID) | ORAL | Status: DC
  Administered 2014-02-26 – 2014-02-27 (×4): 237 mL via ORAL

## 2014-02-25 NOTE — Progress Notes (Signed)
Patient BM:WUXLK:[name] [name]      DOB: 06/07/1931      GMW:[id number]   [facility] at [facility] Progress Note    Subjective: States she feels a little better today. Tolerating NRB.  Still not had BM. No pain.      Filed Vitals:   02/25/14 1233  BP: 121/52  Pulse: 66  Temp: 98.1 F (36.7 C)  Resp: 26   Physical exam: GEN: more alert, NAD HEENT: [place], sclera anicteric CV: RRR LUNGS: basilar crackles ABD: soft EXT: no edema    Assessment and plan: 80 yo female with PMHx of PE/DVT, OSA, Obesity, diastolic HF, Afib, CAD, chronic resp failure who presented with hypoxia and dyspnea.  1. Code Status: Full   2. Goals of Care:  See initial consult note. Physician son Onalee HuaDavid at bedside today.  He plans on talking more with family about her overall prognosis from pulm HTN and continued concerns around O2 requirements despite ongoing diuresis, steroids, etc. Onalee HuaDavid acknowledges that family hopes for miracle and likely do not have realistic expectations.  My discussion with [name] and Michele Mcalpinehil yesterday indicate they may have some mistrust with what Onalee HuaDavid states. Suspect this will be challenge for him as well with family dynamics.  Appreciate Dr Reginia NaasAlva's conversation with family. Will see what progress Onalee HuaDavid makes over weekend.  I gave them my contact info should urgent needs arise.  If still family conflict, I think multidisciplinary meeting with family would give highest probability to amicable goals.    3. Symptom Management:  1. Hypoxic/Hypercapnic Resp Failure with dyspnea- Ongoing, off opioids, dyspnea/pain not subjective issue for her.  2. Constipation- still no BM. Suppository today. Add additional dose of PRN miralax daily.   4. Psychosocial/Spiritual: Close with Son Michele Mcalpinehil and his wife as well as daughter Amy. Lives at home. She prefers we do not call son Harvie HeckRandy who is a physician.    Orvis BrillAaron J. Ivori Storr D.O. Palliative Medicine Team at [facility]  Pager: [phone] Team Phone:  [phone]

## 2014-02-25 NOTE — Progress Notes (Signed)
ANTICOAGULATION CONSULT NOTE   Pharmacy Consult for warfarin Indication: history of PE/ atrial fibrillation  Allergies  Allergen Reactions  . Prednisone     Eyes swelling Pt is not sure if it was prednisone.     Patient Measurements: Height: 5\' 4"  (162.6 cm) Weight: 268 lb 15.4 oz (122 kg) IBW/kg (Calculated) : 54.7 Heparin Dosing Weight: 84.2 kg  Vital Signs: Temp: 97.7 F (36.5 C) (10/16 0723) Temp Source: Axillary (10/16 0723) BP: 111/52 mmHg (10/16 1019) Pulse Rate: 65 (10/16 0800)  Labs:  Recent Labs  02/23/14 0246 02/24/14 0332 02/24/14 0900 02/25/14 0319  HGB  --   --  11.9* 11.9*  HCT  --   --  37.4 38.1  PLT  --   --  327 370  LABPROT 19.7* 20.5*  --  22.3*  INR 1.67* 1.74*  --  1.94*  CREATININE 1.56*  --  1.21* 1.20*    Estimated Creatinine Clearance: 48.2 ml/min (by C-G formula based on Cr of 1.2).   Medical History: Past Medical History  Diagnosis Date  . Congestive heart failure, unspecified     a. ECHO 6/0: EF 55-60%, mild LVH, mildly dilated RV w. mildly dec fx, RVSP 67  . Chronic atrial fibrillation     failed cardioversion in past  -repeat DC-CV on October 5th, 2010  . Personal history of DVT (deep vein thrombosis) 2008  . Obstructive sleep apnea   . HTN (hypertension)   . Morbid obesity   . Shingles     with postherpetic neuraigia  . GERD (gastroesophageal reflux disease)   . Hyperlipidemia   . Meningitis 1950s  . Gallstones     s/p cholecystectomy  . CAD (coronary artery disease)     nonobstructive by cath 8/10 - LAD 40-50% w mild PAH mean 29 w PVR 3.2 [place]  . AAA (abdominal aortic aneurysm)     small  . PE (pulmonary embolism)     history of  . Anemia     Medications:  PTA Warfarin 1.25 mg on Monday, 2.5 mg on all other days.   Assessment: [name] admitted w/ SOB. On warfarin PTA for atrial fibrillation and history of PE. INR is therapeutic at 1.9 (goal INR 1.8-2.2 per outpatient coumadin clinic).  No bleeding  complications noted.  Pt would do best with an average of 4 mg warfarin per day, based on trend over the past week.   PTA dose: 1.25 mg on Mondays, 2.5 mg on all other days    Goal of Therapy:  Goal INR 1.8-2.2 Monitor platelets by anticoagulation protocol: Yes    Plan:  -Warfarin 4 mg po x1 -Daily INR -Monitor for s/sx bleeding   Agapito GamesAlison Debbie Bellucci, PharmD, BCPS Clinical Pharmacist Pager: [phone] 02/25/2014 11:00 AM

## 2014-02-25 NOTE — Progress Notes (Signed)
[facility] PROGRESS NOTE  [name] [name] KPQ:[id number] DOB: 04/27/1932 DOA: 02/18/2014 PCP: Kerby Nora, MD Brief narrative 80 y.o. female with Past medical history of chronic diastolic heart failure, obstructive sleep apnea on CPAP, on chr home o2 ( 2.5L [place] ) history of DVT& PE in 2008, chronic A. fib on coumadin , coronary artery disease, dyslipidemia, GERD, chronic lower extremity ulcers presenting with complaints of cough and shortness of breath along with hypoxia. Patient progressively short of breath for past 3-4 weeks.  Patient admitted to SDU with acute on chr hypoxic resp failure secondary to CAP  Assessment/Plan: Acute on chr hypoxic resp failure  -Secondary to CAP vs volume overload vs? Pneumonitis, pulmonary HTN.  [name] possble b/l infiltrate on admission CXR. -Blood cx, urine strep and legionella all negative.  - Scheduled xopenex neb and antitussives. Added Pulmicort neb twice a day. -Patient had worsening  short of breath with followup x-ray showing increased pulmonary edema on 10-12. Elevated proBNP noted. ESR also elevated.  -Repeat Check 2-D echo with bubble study as recommended by pulmonary.  -ECHO with severe pulmonary Hypertension.  -appreciate pulmonary evaluation and recommendations.   -Continue with lasix 80 mg BID.  -Completed Levaquin for 7 days on 9-15.  -Started on Solumedrol 80 Mg BID on 1-14.  -off bipap today.   Encephalopathy: Multifactorial, opioids, Vs hypercapnia.  Patient more alert today.   Acute on Chronic diastolic heart failure exacerbation.  Continue with zaroxyline. Monitor I/O and daily wts Continue with IV lasix 80 mg BID. Metolazone.  Chest x ray 10-15 evidence of heart failure, left lobe consolidation.  Negative 5 L.   History of A. fib and PE PE in 2008. Currently in sinus. coumadin dose per pharmacy continue Cardizem and flecainide  Acute gouty arthritis Has swelling and tenderness on left great toe. Has underlying CKD so will not  use NSAIDs.  Elevated uric acid.  =continue with topical Voltaren gel. Holding  home dose Oxycodone to avoid oversedation.   CKD stage III Cr per prior records at 1.4 to 1.7. Monitor while on  IV lasix dose -Cr stable, trending down. Today at 1.2   Dyslipidemia  cont statin      Code Status: FULL CODE Family Communication: son at bedside.  Disposition Plan: remain in the step down.    Consultants:  CCM  Procedures:  ECHO  Antibiotics:  Levaquin ( 10/9--)  HPI/Subjective: She is more alert, breathing better. She has better spirit. Her son who is a physician was in the room.  Patient with sporadic cough per report. Patient would like regular food.  She is feeling better today.   Objective: Filed Vitals:   02/25/14 1019  BP: 111/52  Pulse:   Temp:   Resp:     Intake/Output Summary (Last 24 hours) at 02/25/14 1232 Last data filed at 02/25/14 0333  Gross per 24 hour  Intake    720 ml  Output   2100 ml  Net  -1380 ml   Filed Weights   02/23/14 0500 02/24/14 0434 02/25/14 0331  Weight: 122.426 kg (269 lb 14.4 oz) 122 kg (268 lb 15.4 oz) 122 kg (268 lb 15.4 oz)    Exam:   General:  Elderly obese female no distress.   HEENT:  moist oral mucosa  chest: bilateral crackles.   CVS: NS1&S2, no murmurs  Abd: soft, NT, ND, BS+  Ext: chr skin graft b/l, no edema, erythema with warmth and swelling over left great toe , tender to palpation  CNS: She is more alert today, following commands.    Data Reviewed: Basic Metabolic Panel:  Recent Labs Lab 02/18/14 2128  02/21/14 0422 02/22/14 0251 02/23/14 0246 02/24/14 0900 02/25/14 0319  NA  --   < > 136* 138 138 144 142  K  --   < > 4.3 3.8 3.5* 3.4* 3.9  CL  --   < > 90* 89* 88* 90* 88*  CO2  --   < > 32 33* 36* 43* 43*  GLUCOSE  --   < > 82 92 97 137* 132*  BUN  --   < > 59* 75* 78* 73* 83*  CREATININE  --   < > 1.70* 1.83* 1.56* 1.21* 1.20*  CALCIUM  --   < > 9.7 9.6 9.7 10.5 10.3  MG 2.1  --    --   --   --   --   --   < > = values in this interval not displayed. Liver Function Tests:  Recent Labs Lab 02/19/14 0224  AST 18  ALT 17  ALKPHOS 107  BILITOT 0.4  PROT 7.6  ALBUMIN 2.6*   No results found for this basename: LIPASE, AMYLASE,  in the last 168 hours No results found for this basename: AMMONIA,  in the last 168 hours CBC:  Recent Labs Lab 02/18/14 1609 02/19/14 0224 02/24/14 0900 02/25/14 0319  WBC 15.8* 15.1* 9.8 15.2*  NEUTROABS 13.9*  --   --   --   HGB 12.8 11.4* 11.9* 11.9*  HCT 39.7 35.8* 37.4 38.1  MCV 90.0 90.2 91.0 91.1  PLT 278 301 327 370   Cardiac Enzymes: No results found for this basename: CKTOTAL, CKMB, CKMBINDEX, TROPONINI,  in the last 168 hours BNP (last 3 results)  Recent Labs  02/18/14 1609 02/22/14 0947 02/24/14 0332  PROBNP 1539.0* 2319.0* 2410.0*   CBG:  Recent Labs Lab 02/20/14 0755 02/23/14 1134 02/24/14 1711 02/25/14 0722  GLUCAP 101* 122* 137* 170*    Recent Results (from the past 240 hour(s))  CULTURE, BLOOD (ROUTINE X 2)     Status: None   Collection Time    02/18/14  9:22 PM      Result Value Ref Range Status   Specimen Description BLOOD RIGHT ARM   Final   Special Requests BOTTLES DRAWN AEROBIC AND ANAEROBIC 10CC EACH   Final   Culture  Setup Time     Final   Value: 02/19/2014 03:20     Performed at [facility]   Culture     Final   Value: NO GROWTH 5 DAYS     Performed at [facility]   Report Status 02/25/2014 FINAL   Final  CULTURE, BLOOD (ROUTINE X 2)     Status: None   Collection Time    02/18/14  9:28 PM      Result Value Ref Range Status   Specimen Description BLOOD RIGHT FOREARM   Final   Special Requests BOTTLES DRAWN AEROBIC AND ANAEROBIC [facility]   Final   Culture  Setup Time     Final   Value: 02/19/2014 03:19     Performed at [facility]   Culture     Final   Value: NO GROWTH 5 DAYS     Performed at [facility]   Report Status 02/25/2014 FINAL    Final  MRSA PCR SCREENING     Status: None   Collection Time    02/18/14 10:34  PM      Result Value Ref Range Status   MRSA by PCR NEGATIVE  NEGATIVE Final   Comment:            The GeneXpert MRSA Assay (FDA     approved for NASAL specimens     only), is one component of a     comprehensive MRSA colonization     surveillance program. It is not     intended to diagnose MRSA     infection nor to guide or     monitor treatment for     MRSA infections.  CULTURE, EXPECTORATED SPUTUM-ASSESSMENT     Status: None   Collection Time    02/24/14  9:20 PM      Result Value Ref Range Status   Specimen Description SPUTUM   Final   Special Requests NONE   Final   Sputum evaluation     Final   Value: THIS SPECIMEN IS ACCEPTABLE. RESPIRATORY CULTURE REPORT TO FOLLOW.   Report Status 02/24/2014 FINAL   Final  CULTURE, RESPIRATORY (NON-EXPECTORATED)     Status: None   Collection Time    02/24/14  9:20 PM      Result Value Ref Range Status   Specimen Description SPUTUM   Final   Special Requests NONE   Final   Gram Stain     Final   Value: RARE WBC PRESENT, PREDOMINANTLY MONONUCLEAR     RARE SQUAMOUS EPITHELIAL CELLS PRESENT     MODERATE GRAM POSITIVE COCCI     IN PAIRS IN CLUSTERS     Performed at [facility]   Culture PENDING   Incomplete   Report Status PENDING   Incomplete     Studies: Dg Chest Port 1 View  02/24/2014   CLINICAL DATA:  Pulmonary edema ; persistent shortness of breath  EXAM: PORTABLE CHEST - 1 VIEW  COMPARISON:  February 21, 2014  FINDINGS: There is again noted cardiomegaly with mild pulmonary venous hypertension. There is generalized interstitial edema, stable. There is airspace consolidation in the left base. There is a small left effusion, stable. There is no new opacity.  IMPRESSION: Evidence of a degree of congestive heart failure, stable. Consolidation the left base may represent alveolar edema or superimposed pneumonia. The appearance is stable compared to  recent prior study.   Electronically Signed   By: Lowella Grip M.D.   On: 02/24/2014 09:54    Scheduled Meds: . allopurinol  100 mg Oral Daily  . antiseptic oral rinse  7 mL Mouth Rinse q12n4p  . atorvastatin  10 mg Oral Daily  . bisacodyl  5 mg Oral Daily  . budesonide (PULMICORT) nebulizer solution  0.5 mg Nebulization BID  . chlorhexidine  15 mL Mouth Rinse BID  . diclofenac sodium  2 g Topical QID  . diltiazem  120 mg Oral Daily  . escitalopram  20 mg Oral Daily  . flecainide  100 mg Oral Q12H  . furosemide  80 mg Intravenous Q12H  . levalbuterol  0.63 mg Nebulization QID  . methylPREDNISolone (SOLU-MEDROL) injection  80 mg Intravenous Q12H  . metolazone  2.5 mg Oral Daily  . polyethylene glycol  17 g Oral Daily  . senna-docusate  2 tablet Oral BID  . warfarin  4 mg Oral ONCE-1800  . Warfarin - Pharmacist Dosing Inpatient   Does not apply q1800   Continuous Infusions:      Time spent: 25 minutes    Jermario Kalmar A  [facility] Pager [phone] If 7PM-7AM, please contact night-coverage at [email or web address], password [facility] 02/25/2014, 12:32 PM  LOS: 7 days

## 2014-02-25 NOTE — Discharge Instructions (Signed)

## 2014-02-25 NOTE — Progress Notes (Signed)
PULMONARY / CRITICAL CARE MEDICINE   Name: [name] [name] MRN: [id number] DOB: 03-17-1932    ADMISSION DATE:  02/18/2014 CONSULTATION DATE:  10/11/5   REFERRING MD :  [facility]  CHIEF COMPLAINT:  sob  INITIAL PRESENTATION:   89 yowf obese never smoker with  last seen by Dr Lake Bells 01/19/13 with dx CHF, obesity, prior pulmonary emboli and pulmonary hypertension related to obstructive sleep apnea admit by [facility] 10/9 with one week of worsening resting and noct sob  with dx of OHS/ ? CAP but not better on bipap so pccm svc asked to see am 10/11 PMH - DVT on the left and PE in 7564, diastolic HF on chronic diuretics She uses CPAP at 7- 10 cm of water every night. At baseline she is on 2.5 L of oxygen continuously, 4L on exertion.      SUBJECTIVE:  Used noct  bipap  -off daytime diuresed well WOB ok on NRB   VITAL SIGNS: Temp:  [97.5 F (36.4 C)-98.2 F (36.8 C)] 98.1 F (36.7 C) (10/16 1233) Pulse Rate:  [58-67] 66 (10/16 1233) Resp:  [15-26] 26 (10/16 1233) BP: (102-135)/(48-62) 121/52 mmHg (10/16 1233) SpO2:  [89 %-98 %] 94 % (10/16 1233) FiO2 (%):  [50 %-75 %] 70 % (10/16 1203) Weight:  [122 kg (268 lb 15.4 oz)] 122 kg (268 lb 15.4 oz) (10/16 0331) HEMODYNAMICS:   VENTILATOR SETTINGS: Vent Mode:  [-]  FiO2 (%):  [50 %-75 %] 70 % INTAKE / OUTPUT:  Intake/Output Summary (Last 24 hours) at 02/25/14 1322 Last data filed at 02/25/14 1234  Gross per 24 hour  Intake    720 ml  Output   3300 ml  Net  -2580 ml    PHYSICAL EXAMINATION: General:  NAD, RASS 0 No focal deficits HEENT WNL JVP not well visualized Faint bibasilar crackles RRR s M Abd obese, soft, +BS Ext warm with no edema   LABS: I have reviewed all of today's lab results. 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 & diastolic CHF  PLAN: Cont diuresis -lasix 80 q 12h  trial of systemic steroids for possible pneumonitis 10/14 -empiric -can stop in 3 days if no effect Cont nebulized BDs and steroids Noct bipap, O2 daytime She is a very poor candidate for intubation or ACLS - updated son [name] who is a family physician, understands dismal prognosis & fully on board with palliation, however other sons [name] & [name] are not on board -pt herself is non committal & will probably go with medical recommendations - this is a longer palliative conversation & appreciate team involvement  Kara Mead MD. FCCP. [facility] Pager [phone] If no response call [phone]

## 2014-02-26 ENCOUNTER — Inpatient Hospital Stay (HOSPITAL_COMMUNITY): Payer: Medicare Other

## 2014-02-26 LAB — GLUCOSE, CAPILLARY
GLUCOSE-CAPILLARY: 161 mg/dL — AB (ref 70–99)
GLUCOSE-CAPILLARY: 166 mg/dL — AB (ref 70–99)
GLUCOSE-CAPILLARY: 124 mg/dL — AB (ref 70–99)
GLUCOSE-CAPILLARY: 232 mg/dL — AB (ref 70–99)

## 2014-02-26 LAB — BASIC METABOLIC PANEL
ANION GAP: 11 (ref 5–15)
BUN: 85 mg/dL — ABNORMAL HIGH (ref 6–23)
CALCIUM: 10.4 mg/dL (ref 8.4–10.5)
CO2: 44 mEq/L (ref 19–32)
Chloride: 85 mEq/L — ABNORMAL LOW (ref 96–112)
Creatinine, Ser: 1.2 mg/dL — ABNORMAL HIGH (ref 0.50–1.10)
GFR calc non Af Amer: 42 mL/min — ABNORMAL LOW (ref >90)
GFR, EST AFRICAN AMERICAN: 48 mL/min — AB (ref >90)
Glucose, Bld: 115 mg/dL — ABNORMAL HIGH (ref 70–99)
Potassium: 3.7 mEq/L (ref 3.7–5.3)
Sodium: 140 mEq/L (ref 137–147)

## 2014-02-26 LAB — PROTIME-INR
INR: 2.25 — AB (ref 0.00–1.49)
PROTHROMBIN TIME: 25.1 s — AB (ref 11.6–15.2)

## 2014-02-26 LAB — CBC
HCT: 39.9 % (ref 36.0–46.0)
Hemoglobin: 12.5 g/dL (ref 12.0–15.0)
MCH: 28.2 pg (ref 26.0–34.0)
MCHC: 31.3 g/dL (ref 30.0–36.0)
MCV: 90.1 fL (ref 78.0–100.0)
PLATELETS: 404 10*3/uL — AB (ref 150–400)
RBC: 4.43 MIL/uL (ref 3.87–5.11)
RDW: 16.1 % — AB (ref 11.5–15.5)
WBC: 17.1 10*3/uL — ABNORMAL HIGH (ref 4.0–10.5)

## 2014-02-26 MED ORDER — METHYLPREDNISOLONE SODIUM SUCC 40 MG IJ SOLR
40.0000 mg | Freq: Two times a day (BID) | INTRAMUSCULAR | Status: DC
  Administered 2014-02-26 – 2014-02-28 (×5): 40 mg via INTRAVENOUS
  Filled 2014-02-26 (×6): qty 1

## 2014-02-26 MED ORDER — FLEET ENEMA 7-19 GM/118ML RE ENEM
1.0000 | ENEMA | Freq: Once | RECTAL | Status: AC
  Administered 2014-02-26: 15:00:00 via RECTAL
  Filled 2014-02-26: qty 1

## 2014-02-26 MED ORDER — WARFARIN SODIUM 2.5 MG PO TABS
2.5000 mg | ORAL_TABLET | Freq: Once | ORAL | Status: AC
  Administered 2014-02-26: 2.5 mg via ORAL
  Filled 2014-02-26: qty 1

## 2014-02-26 NOTE — Progress Notes (Signed)
RT placed pt on Bipap 14/6, f-12, 50% O2. Pt is resting and is tolerating Bipap well at this time. RT will continue to monitor as needed.

## 2014-02-26 NOTE — Progress Notes (Signed)
Patient disimpacted for large amt of soft brown stool.  Pt tolerated procedure fairly well.  Recommend use of Sorbitol for future constipation problems in future as patient is very weak and unable to aid with bowel motility.

## 2014-02-26 NOTE — Progress Notes (Signed)
[facility] PROGRESS NOTE  [name] [name] MCN:[id number] DOB: 04-07-1932 DOA: 02/18/2014 PCP: Eliezer Lofts, MD Brief narrative 80 y.o. female with Past medical history of chronic diastolic heart failure, obstructive sleep apnea on CPAP, on chr home o2 ( 2.5L [place] ) history of DVT& PE in 2008, chronic A. fib on coumadin , coronary artery disease, dyslipidemia, GERD, chronic lower extremity ulcers presenting with complaints of cough and shortness of breath along with hypoxia. Patient progressively short of breath for past 3-4 weeks.  Patient admitted to SDU with acute on chr hypoxic resp failure secondary to CAP  Assessment/Plan: Acute on chr hypoxic resp failure  -Secondary to CAP vs volume overload vs? Pneumonitis, pulmonary HTN.  [name] possble b/l infiltrate on admission CXR. -Blood cx, urine strep and legionella all negative.  - Scheduled xopenex neb and antitussives. Added Pulmicort neb twice a day. -Patient had worsening  short of breath with followup x-ray showing increased pulmonary edema on 10-12. Elevated proBNP noted. ESR also elevated.  -Repeat Check 2-D echo with bubble study as recommended by pulmonary.  -ECHO with severe pulmonary Hypertension.  -appreciate pulmonary evaluation and recommendations.   -Continue with lasix 80 mg BID.  -Completed Levaquin for 7 days on 9-15.  -Solumedrol 80 Mg BID on 1-14. Change to 40 mg BID 10-17 -using BIPAP at night only. On Non-rebreder.   Constipation; dulcolax supositiory today. Check X ray.   Encephalopathy: resolved.  Multifactorial, opioids, Vs hypercapnia.  Patient more alert today.   Acute on Chronic diastolic heart failure exacerbation.  Continue with zaroxyline. Monitor I/O and daily wts Continue with IV lasix 80 mg BID. Metolazone.  Chest x ray 10-15 evidence of heart failure, left lobe consolidation.  Negative 5 L.   History of A. fib and PE PE in 2008. Currently in sinus. coumadin dose per pharmacy continue Cardizem and  flecainide  Acute gouty arthritis =continue with topical Voltaren gel. Holding  home dose Oxycodone to avoid oversedation. Resolved.   CKD stage III Cr per prior records at 1.4 to 1.7. Monitor while on  IV lasix dose -Cr stable, trending down. Today at 1.2   Dyslipidemia  cont statin   Code Status: FULL CODE Family Communication: Care discussed with patient. We talk today about Code status. She will think about it.  Disposition Plan: remain in the step down.    Consultants:  CCM  Procedures:  ECHO  Antibiotics:  Levaquin ( 10/9--)  HPI/Subjective: She is feeling better. Cough better. Breathing better Relates pain when try to have BM.   Objective: Filed Vitals:   02/26/14 0750  BP: 111/84  Pulse: 63  Temp: 97.6 F (36.4 C)  Resp: 24    Intake/Output Summary (Last 24 hours) at 02/26/14 1043 Last data filed at 02/26/14 0935  Gross per 24 hour  Intake   1852 ml  Output   3050 ml  Net  -1198 ml   Filed Weights   02/24/14 0434 02/25/14 0331 02/26/14 0407  Weight: 122 kg (268 lb 15.4 oz) 122 kg (268 lb 15.4 oz) 120.3 kg (265 lb 3.4 oz)    Exam:   General:  Elderly obese female no distress.   HEENT:  moist oral mucosa  chest: bilateral crackles.   CVS: NS1&S2, no murmurs  Abd: soft, NT, ND, BS+  Ext: trace edema.   CNS: Alert and following command.    Data Reviewed: Basic Metabolic Panel:  Recent Labs Lab 02/22/14 0251 02/23/14 0246 02/24/14 0900 02/25/14 0319 02/26/14 0302  NA  138 138 144 142 140  K 3.8 3.5* 3.4* 3.9 3.7  CL 89* 88* 90* 88* 85*  CO2 33* 36* 43* 43* 44*  GLUCOSE 92 97 137* 132* 115*  BUN 75* 78* 73* 83* 85*  CREATININE 1.83* 1.56* 1.21* 1.20* 1.20*  CALCIUM 9.6 9.7 10.5 10.3 10.4   Liver Function Tests: No results found for this basename: AST, ALT, ALKPHOS, BILITOT, PROT, ALBUMIN,  in the last 168 hours No results found for this basename: LIPASE, AMYLASE,  in the last 168 hours No results found for this  basename: AMMONIA,  in the last 168 hours CBC:  Recent Labs Lab 02/24/14 0900 02/25/14 0319 02/26/14 0302  WBC 9.8 15.2* 17.1*  HGB 11.9* 11.9* 12.5  HCT 37.4 38.1 39.9  MCV 91.0 91.1 90.1  PLT 327 370 404*   Cardiac Enzymes: No results found for this basename: CKTOTAL, CKMB, CKMBINDEX, TROPONINI,  in the last 168 hours BNP (last 3 results)  Recent Labs  02/18/14 1609 02/22/14 0947 02/24/14 0332  PROBNP 1539.0* 2319.0* 2410.0*   CBG:  Recent Labs Lab 02/25/14 0722 02/25/14 1231 02/25/14 1502 02/25/14 2027 02/26/14 0755  GLUCAP 170* 142* 161* 278* 161*    Recent Results (from the past 240 hour(s))  CULTURE, BLOOD (ROUTINE X 2)     Status: None   Collection Time    02/18/14  9:22 PM      Result Value Ref Range Status   Specimen Description BLOOD RIGHT ARM   Final   Special Requests BOTTLES DRAWN AEROBIC AND ANAEROBIC 10CC EACH   Final   Culture  Setup Time     Final   Value: 02/19/2014 03:20     Performed at [place]     Final   Value: NO GROWTH 5 DAYS     Performed at [facility]   Report Status 02/25/2014 FINAL   Final  CULTURE, BLOOD (ROUTINE X 2)     Status: None   Collection Time    02/18/14  9:28 PM      Result Value Ref Range Status   Specimen Description BLOOD RIGHT FOREARM   Final   Special Requests BOTTLES DRAWN AEROBIC AND ANAEROBIC [facility] EACH   Final   Culture  Setup Time     Final   Value: 02/19/2014 03:19     Performed at [facility]   Culture     Final   Value: NO GROWTH 5 DAYS     Performed at [facility]   Report Status 02/25/2014 FINAL   Final  MRSA PCR SCREENING     Status: None   Collection Time    02/18/14 10:34 PM      Result Value Ref Range Status   MRSA by PCR NEGATIVE  NEGATIVE Final   Comment:            The GeneXpert MRSA Assay (FDA     approved for NASAL specimens     only), is one component of a     comprehensive MRSA colonization     surveillance program. It is not      intended to diagnose MRSA     infection nor to guide or     monitor treatment for     MRSA infections.  CULTURE, EXPECTORATED SPUTUM-ASSESSMENT     Status: None   Collection Time    02/24/14  9:20 PM      Result Value Ref Range Status  Specimen Description SPUTUM   Final   Special Requests NONE   Final   Sputum evaluation     Final   Value: THIS SPECIMEN IS ACCEPTABLE. RESPIRATORY CULTURE REPORT TO FOLLOW.   Report Status 02/24/2014 FINAL   Final  CULTURE, RESPIRATORY (NON-EXPECTORATED)     Status: None   Collection Time    02/24/14  9:20 PM      Result Value Ref Range Status   Specimen Description SPUTUM   Final   Special Requests NONE   Final   Gram Stain     Final   Value: RARE WBC PRESENT, PREDOMINANTLY MONONUCLEAR     RARE SQUAMOUS EPITHELIAL CELLS PRESENT     MODERATE GRAM POSITIVE COCCI     IN PAIRS IN CLUSTERS     Performed at [facility]   Culture     Final   Value: NORMAL OROPHARYNGEAL FLORA     Performed at [facility]   Report Status PENDING   Incomplete     Studies: No results found.  Scheduled Meds: . allopurinol  100 mg Oral Daily  . antiseptic oral rinse  7 mL Mouth Rinse q12n4p  . atorvastatin  10 mg Oral Daily  . bisacodyl  5 mg Oral Daily  . budesonide (PULMICORT) nebulizer solution  0.5 mg Nebulization BID  . chlorhexidine  15 mL Mouth Rinse BID  . diclofenac sodium  2 g Topical QID  . diltiazem  120 mg Oral Daily  . escitalopram  20 mg Oral Daily  . feeding supplement (ENSURE COMPLETE)  237 mL Oral BID BM  . flecainide  100 mg Oral Q12H  . furosemide  80 mg Intravenous Q12H  . levalbuterol  0.63 mg Nebulization QID  . methylPREDNISolone (SOLU-MEDROL) injection  40 mg Intravenous Q12H  . metolazone  2.5 mg Oral Daily  . polyethylene glycol  17 g Oral BID  . senna-docusate  2 tablet Oral BID  . warfarin  2.5 mg Oral ONCE-1800  . Warfarin - Pharmacist Dosing Inpatient   Does not apply q1800   Continuous Infusions:       Time spent: 25 minutes    Niel Hummer A  [facility] Pager [phone] If 7PM-7AM, please contact night-coverage at [email or web address], password [facility] 02/26/2014, 10:43 AM  LOS: 8 days

## 2014-02-26 NOTE — Progress Notes (Signed)
PULMONARY / CRITICAL CARE MEDICINE   Name: [name] [name] MRN: [id number] DOB: Nov 23, 1931    ADMISSION DATE:  02/18/2014 CONSULTATION DATE:  10/11/5   REFERRING MD :  [facility]  CHIEF COMPLAINT:  sob  INITIAL PRESENTATION:   31 yowf obese never smoker with  last seen by Dr Lake Bells 01/19/13 with dx CHF, obesity, prior pulmonary emboli and pulmonary hypertension related to obstructive sleep apnea admit by [facility] 10/9 with one week of worsening resting and noct sob  with dx of OHS/ ? CAP but not better on bipap so pccm svc asked to see am 10/11 PMH - DVT on the left and PE in 1219, diastolic HF on chronic diuretics She uses CPAP at 7- 10 cm of water every night. At baseline she is on 2.5 L of oxygen continuously, 4L on exertion.      SUBJECTIVE:  Cont on bipap qhs, prn day On NRM this am, talking in sentences No BM in one week Diuresed well 10/16  VITAL SIGNS: Temp:  [97.6 F (36.4 C)-98.1 F (36.7 C)] 97.6 F (36.4 C) (10/17 0750) Pulse Rate:  [63-70] 63 (10/17 0750) Resp:  [16-93] 24 (10/17 0750) BP: (107-122)/(50-84) 111/84 mmHg (10/17 0750) SpO2:  [90 %-97 %] 94 % (10/17 0407) FiO2 (%):  [55 %-70 %] 55 % (10/16 1736) Weight:  [120.3 kg (265 lb 3.4 oz)] 120.3 kg (265 lb 3.4 oz) (10/17 0407)    VENTILATOR SETTINGS: Vent Mode:  [-]  FiO2 (%):  [55 %-70 %] 55 % INTAKE / OUTPUT:  Intake/Output Summary (Last 24 hours) at 02/26/14 0847 Last data filed at 02/26/14 0754  Gross per 24 hour  Intake   2036 ml  Output   3050 ml  Net  -1014 ml    PHYSICAL EXAMINATION: General:  NAD, RASS 0 No focal deficits HEENT WNL JVP not well visualized Faint bibasilar crackles RRR s M Abd obese, soft, +BS Ext warm with no edema   LABS: I have reviewed all of today's lab results. Relevant abnormalities are discussed in the A/P section  CXR: no film 10/17    ASSESSMENT: Acute on (mostly) chronic hypercarbic resp failure Acute on chronic hypoxic resp failure - out of proportion to  CXR findings  Doubt PE as pt is anticoagulated  Consider possibility of R>L shunt (consider repeat Echo with bubble study) Wheezing, resolved Vague IS prominence - edema vs pneumonitis Markedly elevated ESR of unclear etiology OHS, possible OSA Severe PH -secondary to old PE & diastolic CHF  PLAN: Cont diuresis -lasix 80 q 12h Cont trial of systemic steroids for possible pneumonitis  Cont nebulized BDs and steroids Noct bipap, O2 daytime Apprec palliative care note. I agree, the pt is resolved to remain with aggressive care as is her [employer]. Pt remains high risk need for vent BM program per primary team   Mariel Sleet Beeper  [phone]  Cell  [phone]  If no response or cell goes to voicemail, call beeper [phone] 8:50 AM 02/26/2014

## 2014-02-26 NOTE — Progress Notes (Signed)
ANTICOAGULATION CONSULT NOTE   Pharmacy Consult for warfarin Indication: history of PE/ atrial fibrillation  Allergies  Allergen Reactions  . Prednisone     Eyes swelling Pt is not sure if it was prednisone.     Patient Measurements: Height: 5\' 4"  (162.6 cm) Weight: 265 lb 3.4 oz (120.3 kg) IBW/kg (Calculated) : 54.7 Heparin Dosing Weight: 84.2 kg  Vital Signs: Temp: 97.6 F (36.4 C) (10/17 0750) Temp Source: Axillary (10/17 0750) BP: 111/84 mmHg (10/17 0750) Pulse Rate: 63 (10/17 0750)  Labs:  Recent Labs  02/24/14 0332  02/24/14 0900 02/25/14 0319 02/26/14 0302  HGB  --   < > 11.9* 11.9* 12.5  HCT  --   --  37.4 38.1 39.9  PLT  --   --  327 370 404*  LABPROT 20.5*  --   --  22.3* 25.1*  INR 1.74*  --   --  1.94* 2.25*  CREATININE  --   --  1.21* 1.20* 1.20*  < > = values in this interval not displayed.  Estimated Creatinine Clearance: 47.8 ml/min (by C-G formula based on Cr of 1.2).   Medical History: Past Medical History  Diagnosis Date  . Congestive heart failure, unspecified     a. ECHO 6/0: EF 55-60%, mild LVH, mildly dilated RV w. mildly dec fx, RVSP 67  . Chronic atrial fibrillation     failed cardioversion in past  -repeat DC-CV on October 5th, 2010  . Personal history of DVT (deep vein thrombosis) 2008  . Obstructive sleep apnea   . HTN (hypertension)   . Morbid obesity   . Shingles     with postherpetic neuraigia  . GERD (gastroesophageal reflux disease)   . Hyperlipidemia   . Meningitis 1950s  . Gallstones     s/p cholecystectomy  . CAD (coronary artery disease)     nonobstructive by cath 8/10 - LAD 40-50% w mild PAH mean 29 w PVR 3.2 Woods  . AAA (abdominal aortic aneurysm)     small  . PE (pulmonary embolism)     history of  . Anemia     Medications:  PTA Warfarin 1.25 mg on Monday, 2.5 mg on all other days.   Assessment: [name] admitted w/ SOB. On warfarin PTA for atrial fibrillation and history of PE. INR is slightly above  established goal at 2.25 today (goal INR 1.8-2.2 per outpatient coumadin clinic).  No bleeding complications noted.   PTA dose: 1.25 mg on Mondays, 2.5 mg on all other days    Goal of Therapy:  Goal INR 1.8-2.2 Monitor platelets by anticoagulation protocol: Yes  Plan:  -Warfarin 2.5 mg po x1 -Daily INR -Monitor for s/sx bleeding  Tad MooreJessica Schon Zeiders, Pharm D, BCPS  Clinical Pharmacist Pager [phone]  02/26/2014 9:09 AM

## 2014-02-27 ENCOUNTER — Inpatient Hospital Stay (HOSPITAL_COMMUNITY): Payer: Medicare Other

## 2014-02-27 DIAGNOSIS — R918 Other nonspecific abnormal finding of lung field: Secondary | ICD-10-CM

## 2014-02-27 DIAGNOSIS — J9611 Chronic respiratory failure with hypoxia: Secondary | ICD-10-CM

## 2014-02-27 LAB — CBC
HCT: 40.6 % (ref 36.0–46.0)
Hemoglobin: 13.1 g/dL (ref 12.0–15.0)
MCH: 28.9 pg (ref 26.0–34.0)
MCHC: 32.3 g/dL (ref 30.0–36.0)
MCV: 89.4 fL (ref 78.0–100.0)
PLATELETS: 421 10*3/uL — AB (ref 150–400)
RBC: 4.54 MIL/uL (ref 3.87–5.11)
RDW: 16.1 % — ABNORMAL HIGH (ref 11.5–15.5)
WBC: 19.2 10*3/uL — AB (ref 4.0–10.5)

## 2014-02-27 LAB — BASIC METABOLIC PANEL
Anion gap: 10 (ref 5–15)
BUN: 95 mg/dL — ABNORMAL HIGH (ref 6–23)
CO2: 45 mEq/L (ref 19–32)
Calcium: 10 mg/dL (ref 8.4–10.5)
Chloride: 84 mEq/L — ABNORMAL LOW (ref 96–112)
Creatinine, Ser: 1.26 mg/dL — ABNORMAL HIGH (ref 0.50–1.10)
GFR, EST AFRICAN AMERICAN: 45 mL/min — AB (ref >90)
GFR, EST NON AFRICAN AMERICAN: 39 mL/min — AB (ref >90)
Glucose, Bld: 135 mg/dL — ABNORMAL HIGH (ref 70–99)
POTASSIUM: 4.1 meq/L (ref 3.7–5.3)
SODIUM: 139 meq/L (ref 137–147)

## 2014-02-27 LAB — URINE MICROSCOPIC-ADD ON

## 2014-02-27 LAB — BLOOD GAS, ARTERIAL
Acid-Base Excess: 21.7 mmol/L — ABNORMAL HIGH (ref 0.0–2.0)
BICARBONATE: 46.9 meq/L — AB (ref 20.0–24.0)
Drawn by: 295031
FIO2: 0.55 %
O2 Saturation: 95.4 %
PATIENT TEMPERATURE: 98.6
PCO2 ART: 56.8 mmHg — AB (ref 35.0–45.0)
TCO2: 48.6 mmol/L (ref 0–100)
pH, Arterial: 7.526 — ABNORMAL HIGH (ref 7.350–7.450)
pO2, Arterial: 72.2 mmHg — ABNORMAL LOW (ref 80.0–100.0)

## 2014-02-27 LAB — URINALYSIS, ROUTINE W REFLEX MICROSCOPIC
Bilirubin Urine: NEGATIVE
Glucose, UA: NEGATIVE mg/dL
Hgb urine dipstick: NEGATIVE
Ketones, ur: NEGATIVE mg/dL
NITRITE: NEGATIVE
PH: 5 (ref 5.0–8.0)
Protein, ur: NEGATIVE mg/dL
SPECIFIC GRAVITY, URINE: 1.015 (ref 1.005–1.030)
Urobilinogen, UA: 0.2 mg/dL (ref 0.0–1.0)

## 2014-02-27 LAB — GLUCOSE, CAPILLARY
Glucose-Capillary: 169 mg/dL — ABNORMAL HIGH (ref 70–99)
Glucose-Capillary: 166 mg/dL — ABNORMAL HIGH (ref 70–99)
Glucose-Capillary: 327 mg/dL — ABNORMAL HIGH (ref 70–99)

## 2014-02-27 LAB — CULTURE, RESPIRATORY W GRAM STAIN

## 2014-02-27 LAB — PROTIME-INR
INR: 2.35 — ABNORMAL HIGH (ref 0.00–1.49)
PROTHROMBIN TIME: 25.9 s — AB (ref 11.6–15.2)

## 2014-02-27 LAB — CULTURE, RESPIRATORY: CULTURE: NORMAL

## 2014-02-27 MED ORDER — GLUCERNA SHAKE PO LIQD
237.0000 mL | Freq: Three times a day (TID) | ORAL | Status: DC
  Administered 2014-03-01 – 2014-03-02 (×3): 237 mL via ORAL

## 2014-02-27 MED ORDER — WARFARIN 1.25 MG HALF TABLET
1.2500 mg | ORAL_TABLET | Freq: Once | ORAL | Status: AC
  Administered 2014-02-27: 1.25 mg via ORAL
  Filled 2014-02-27: qty 1

## 2014-02-27 MED ORDER — INSULIN ASPART 100 UNIT/ML ~~LOC~~ SOLN
0.0000 [IU] | Freq: Every day | SUBCUTANEOUS | Status: DC
  Administered 2014-02-27: 4 [IU] via SUBCUTANEOUS

## 2014-02-27 MED ORDER — INSULIN ASPART 100 UNIT/ML ~~LOC~~ SOLN
0.0000 [IU] | Freq: Three times a day (TID) | SUBCUTANEOUS | Status: DC
  Administered 2014-02-28: 2 [IU] via SUBCUTANEOUS
  Administered 2014-03-01: 1 [IU] via SUBCUTANEOUS
  Administered 2014-03-01: 3 [IU] via SUBCUTANEOUS
  Administered 2014-03-02: 1 [IU] via SUBCUTANEOUS

## 2014-02-27 NOTE — Progress Notes (Signed)
ANTICOAGULATION CONSULT NOTE   Pharmacy Consult for warfarin Indication: history of PE/ atrial fibrillation  Allergies  Allergen Reactions  . Prednisone     Eyes swelling Pt is not sure if it was prednisone.     Patient Measurements: Height: 5\' 4"  (162.6 cm) Weight: 267 lb 13.7 oz (121.5 kg) IBW/kg (Calculated) : 54.7 Heparin Dosing Weight: 84.2 kg  Vital Signs: Temp: 97.4 F (36.3 C) (10/18 0734) Temp Source: Axillary (10/18 0734) BP: 116/55 mmHg (10/18 0734) Pulse Rate: 64 (10/18 0734)  Labs:  Recent Labs  02/25/14 0319 02/26/14 0302 02/27/14 0323  HGB 11.9* 12.5 13.1  HCT 38.1 39.9 40.6  PLT 370 404* 421*  LABPROT 22.3* 25.1* 25.9*  INR 1.94* 2.25* 2.35*  CREATININE 1.20* 1.20* 1.26*    Estimated Creatinine Clearance: 45.8 ml/min (by C-G formula based on Cr of 1.26).   Medical History: Past Medical History  Diagnosis Date  . Congestive heart failure, unspecified     a. ECHO 6/0: EF 55-60%, mild LVH, mildly dilated RV w. mildly dec fx, RVSP 67  . Chronic atrial fibrillation     failed cardioversion in past  -repeat DC-CV on October 5th, 2010  . Personal history of DVT (deep vein thrombosis) 2008  . Obstructive sleep apnea   . HTN (hypertension)   . Morbid obesity   . Shingles     with postherpetic neuraigia  . GERD (gastroesophageal reflux disease)   . Hyperlipidemia   . Meningitis 1950s  . Gallstones     s/p cholecystectomy  . CAD (coronary artery disease)     nonobstructive by cath 8/10 - LAD 40-50% w mild PAH mean 29 w PVR 3.2 [place]  . AAA (abdominal aortic aneurysm)     small  . PE (pulmonary embolism)     history of  . Anemia     Medications:  PTA Warfarin 1.25 mg on Monday, 2.5 mg on all other days.   Assessment: [name] admitted w/ SOB. On warfarin PTA for atrial fibrillation and history of PE. INR is slightly above established goal at 2.25 today (goal INR 1.8-2.2 per outpatient coumadin clinic).  No bleeding complications noted.    PTA dose: 1.25 mg on Mondays, 2.5 mg on all other days    Goal of Therapy:  Goal INR 1.8-2.2 Monitor platelets by anticoagulation protocol: Yes  Plan:  -Warfarin 1.25 mg po x1 -Daily INR -Monitor for s/sx bleeding  Tad MooreJessica Nayelis Bonito, Pharm D, BCPS  Clinical Pharmacist Pager [phone]  02/27/2014 9:14 AM

## 2014-02-27 NOTE — Progress Notes (Signed)
02/27/2014 patient was sleepy before lunch, blood gas was ordered by Dr Sunnie Nielsenegalado. PCO2 was 56.8 notified Dr Delford FieldWright and Dr Sunnie Nielsenegalado was made aware of result. Continue to monitor patient. [facility]enn[facility] HospitalNadine Benjerman Molinelli RN.

## 2014-02-27 NOTE — Progress Notes (Signed)
[facility] PROGRESS NOTE  [name] [name] MVE:[id number] DOB: 1932/02/24 DOA: 02/18/2014 PCP: Eliezer Lofts, MD Brief narrative 80 y.o. female with Past medical history of chronic diastolic heart failure, obstructive sleep apnea on CPAP, on chr home o2 ( 2.5L [place] ) history of DVT& PE in 2008, chronic A. fib on coumadin , coronary artery disease, dyslipidemia, GERD, chronic lower extremity ulcers presenting with complaints of cough and shortness of breath along with hypoxia. Patient progressively short of breath for past 3-4 weeks.  Patient admitted to SDU with acute on chr hypoxic resp failure secondary to CAP  Assessment/Plan: Acute on chr hypoxic resp failure;   -Secondary to CAP vs volume overload vs? Pneumonitis, pulmonary HTN., Heart Failure. . possble b/l infiltrate on admission CXR. -Blood cx, urine strep and legionella all negative.  - Scheduled xopenex neb and antitussives. Added Pulmicort neb twice a day. -Patient had worsening  short of breath with followup x-ray showing increased pulmonary edema on 10-12. Elevated proBNP noted. ESR also elevated.  -Repeat Check 2-D echo with bubble study as recommended by pulmonary.  -ECHO with severe pulmonary Hypertension.  -appreciate pulmonary evaluation and recommendations.   -Continue with lasix 80 mg BID.  -Completed Levaquin for 7 days on 9-15.  -Solumedrol 80 Mg BID on 1-14. Change to 40 mg BID 10-17 -using BIPAP at night only. On Non-rebreder.  -Will Consult cardiology, help with treatment for Heart Failure, any other options for treatments, Would Right side heart cath be helpful, or safe ?   Constipation; dulcolax suppository, miralax. , patient was disimpacted for large amt of soft brown stool. XY negative for obstruction.   Leukocytosis; on steroids, afebrile. X ray stable. Finished Levaquin. Check UA.   Encephalopathy: resolved.  Multifactorial, opioids, Vs hypercapnia.  Patient again sleepy, wake up to answer questions. Falls  back to sleep.  Will repeat STAT ABG. Might need BIPAP again.   Acute on Chronic diastolic heart failure exacerbation.  Continue with zaroxyline. Monitor I/O and daily wts Continue with IV lasix 80 mg BID. Metolazone.  Chest x ray 10-15 evidence of heart failure, left lobe consolidation.  Negative 8 L.   History of A. fib and PE PE in 2008. Currently in sinus. coumadin dose per pharmacy continue Cardizem and flecainide INR therapeutic at 2.3  Acute gouty arthritis; resolved.  continue with topical Voltaren gel. Holding  home dose Oxycodone to avoid oversedation. Resolved.   CKD stage III Cr per prior records at 1.4 to 1.7. Monitor while on  IV lasix dose -Cr stable, trending down. Today at 1.2   Dyslipidemia  cont statin   Code Status: FULL CODE Family Communication: Care discussed with son [name].  Disposition Plan: remain in the step down.    Consultants:  CCM.   Cardiology.   Procedures:  ECHO  Antibiotics:  Levaquin ( 10/9--)  HPI/Subjective: Had BM this morning. She is sitting in bed now.  She is breathing ok. She feels sleepy.   Objective: Filed Vitals:   02/27/14 0734  BP: 116/55  Pulse: 64  Temp: 97.4 F (36.3 C)  Resp: 20    Intake/Output Summary (Last 24 hours) at 02/27/14 1025 Last data filed at 02/27/14 0747  Gross per 24 hour  Intake    720 ml  Output   3825 ml  Net  -3105 ml   Filed Weights   02/25/14 0331 02/26/14 0407 02/27/14 0415  Weight: 122 kg (268 lb 15.4 oz) 120.3 kg (265 lb 3.4 oz) 121.5 kg (267 lb  13.7 oz)    Exam:   General:  Elderly obese female no distress.   HEENT:  moist oral mucosa  chest: bilateral crackles.   CVS: NS1&S2, no murmurs  Abd: soft, NT, ND, BS+  Ext: trace edema.   CNS: Sleepy, open eys, answer questions, following command.    Data Reviewed: Basic Metabolic Panel:  Recent Labs Lab 02/23/14 0246 02/24/14 0900 02/25/14 0319 02/26/14 0302 02/27/14 0323  NA 138 144 142 140 139  K  3.5* 3.4* 3.9 3.7 4.1  CL 88* 90* 88* 85* 84*  CO2 36* 43* 43* 44* 45*  GLUCOSE 97 137* 132* 115* 135*  BUN 78* 73* 83* 85* 95*  CREATININE 1.56* 1.21* 1.20* 1.20* 1.26*  CALCIUM 9.7 10.5 10.3 10.4 10.0   Liver Function Tests: No results found for this basename: AST, ALT, ALKPHOS, BILITOT, PROT, ALBUMIN,  in the last 168 hours No results found for this basename: LIPASE, AMYLASE,  in the last 168 hours No results found for this basename: AMMONIA,  in the last 168 hours CBC:  Recent Labs Lab 02/24/14 0900 02/25/14 0319 02/26/14 0302 02/27/14 0323  WBC 9.8 15.2* 17.1* 19.2*  HGB 11.9* 11.9* 12.5 13.1  HCT 37.4 38.1 39.9 40.6  MCV 91.0 91.1 90.1 89.4  PLT 327 370 404* 421*   Cardiac Enzymes: No results found for this basename: CKTOTAL, CKMB, CKMBINDEX, TROPONINI,  in the last 168 hours BNP (last 3 results)  Recent Labs  02/18/14 1609 02/22/14 0947 02/24/14 0332  PROBNP 1539.0* 2319.0* 2410.0*   CBG:  Recent Labs Lab 02/26/14 0755 02/26/14 1222 02/26/14 1709 02/26/14 2135 02/27/14 0737  GLUCAP 161* 166* 124* 232* 169*    Recent Results (from the past 240 hour(s))  CULTURE, BLOOD (ROUTINE X 2)     Status: None   Collection Time    02/18/14  9:22 PM      Result Value Ref Range Status   Specimen Description BLOOD RIGHT ARM   Final   Special Requests BOTTLES DRAWN AEROBIC AND ANAEROBIC 10CC EACH   Final   Culture  Setup Time     Final   Value: 02/19/2014 03:20     Performed at [facility]   Culture     Final   Value: NO GROWTH 5 DAYS     Performed at [facility]   Report Status 02/25/2014 FINAL   Final  CULTURE, BLOOD (ROUTINE X 2)     Status: None   Collection Time    02/18/14  9:28 PM      Result Value Ref Range Status   Specimen Description BLOOD RIGHT FOREARM   Final   Special Requests BOTTLES DRAWN AEROBIC AND ANAEROBIC [facility]   Final   Culture  Setup Time     Final   Value: 02/19/2014 03:19     Performed at [facility]    Culture     Final   Value: NO GROWTH 5 DAYS     Performed at [facility]   Report Status 02/25/2014 FINAL   Final  MRSA PCR SCREENING     Status: None   Collection Time    02/18/14 10:34 PM      Result Value Ref Range Status   MRSA by PCR NEGATIVE  NEGATIVE Final   Comment:            The GeneXpert MRSA Assay (FDA     approved for NASAL specimens     only), is  one component of a     comprehensive MRSA colonization     surveillance program. It is not     intended to diagnose MRSA     infection nor to guide or     monitor treatment for     MRSA infections.  CULTURE, EXPECTORATED SPUTUM-ASSESSMENT     Status: None   Collection Time    02/24/14  9:20 PM      Result Value Ref Range Status   Specimen Description SPUTUM   Final   Special Requests NONE   Final   Sputum evaluation     Final   Value: THIS SPECIMEN IS ACCEPTABLE. RESPIRATORY CULTURE REPORT TO FOLLOW.   Report Status 02/24/2014 FINAL   Final  CULTURE, RESPIRATORY (NON-EXPECTORATED)     Status: None   Collection Time    02/24/14  9:20 PM      Result Value Ref Range Status   Specimen Description SPUTUM   Final   Special Requests NONE   Final   Gram Stain     Final   Value: RARE WBC PRESENT, PREDOMINANTLY MONONUCLEAR     RARE SQUAMOUS EPITHELIAL CELLS PRESENT     MODERATE GRAM POSITIVE COCCI     IN PAIRS IN CLUSTERS     Performed at [facility]   Culture     Final   Value: NORMAL OROPHARYNGEAL FLORA     Performed at [facility]   Report Status 02/27/2014 FINAL   Final     Studies: Dg Abd 1 View  02/26/2014   CLINICAL DATA:  Lower abdominal pain and constipation.  EXAM: ABDOMEN - 1 VIEW  COMPARISON:  None.  FINDINGS: No free intraperitoneal air is identified. The bowel gas pattern is nonobstructive. Large stool burden throughout the colon noted.  IMPRESSION: No acute finding.  Large colonic stool burden.   Electronically Signed   By: Inge Rise M.D.   On: 02/26/2014 13:36   Dg  Chest Port 1 View  02/27/2014   CLINICAL DATA:  Chronic respiratory failure with hypercapnia.  EXAM: PORTABLE CHEST - 1 VIEW  COMPARISON:  02/24/2014  FINDINGS: The heart size appears moderately enlarged. There is a left pleural effusion moderate pulmonary edema. This is unchanged from previous exam. Diminished lung volumes and pulmonary vascular congestion is unchanged.  IMPRESSION: 1. Stable left effusion and pulmonary vascular congestion.   Electronically Signed   By: Kerby Moors M.D.   On: 02/27/2014 09:37    Scheduled Meds: . allopurinol  100 mg Oral Daily  . antiseptic oral rinse  7 mL Mouth Rinse q12n4p  . atorvastatin  10 mg Oral Daily  . bisacodyl  5 mg Oral Daily  . budesonide (PULMICORT) nebulizer solution  0.5 mg Nebulization BID  . chlorhexidine  15 mL Mouth Rinse BID  . diclofenac sodium  2 g Topical QID  . diltiazem  120 mg Oral Daily  . escitalopram  20 mg Oral Daily  . feeding supplement (ENSURE COMPLETE)  237 mL Oral BID BM  . flecainide  100 mg Oral Q12H  . furosemide  80 mg Intravenous Q12H  . levalbuterol  0.63 mg Nebulization QID  . methylPREDNISolone (SOLU-MEDROL) injection  40 mg Intravenous Q12H  . metolazone  2.5 mg Oral Daily  . polyethylene glycol  17 g Oral BID  . senna-docusate  2 tablet Oral BID  . warfarin  1.25 mg Oral ONCE-1800  . Warfarin - Pharmacist Dosing Inpatient   Does not apply [phone]  Continuous Infusions:      Time spent: 25 minutes    Niel Hummer A  [facility] Pager [phone] If 7PM-7AM, please contact night-coverage at [email or web address], password [facility] 02/27/2014, 10:25 AM  LOS: 9 days

## 2014-02-27 NOTE — Significant Event (Signed)
CRITICAL VALUE ALERT  Critical value received:  CO2 45  Date of notification:  02/27/2014  Time of notification:  0443  Critical value read back:Yes.    Nurse who received alert:  Aldin Drees, Myriam JacobsonStormie Jorden  MD notified (1st page):  [facility] Am[facility] page  Time of first page:  [phone]  Responding MD:  Lenny Pastelom Callahan NP  Time MD responded: [phone]

## 2014-02-27 NOTE — Progress Notes (Signed)
PULMONARY / CRITICAL CARE MEDICINE   Name: [name] [name] MRN: [id number] DOB: 1932/04/08    ADMISSION DATE:  02/18/2014 CONSULTATION DATE:  10/11/5   REFERRING MD :  [facility]  CHIEF COMPLAINT:  sob  INITIAL PRESENTATION:   21 yowf obese never smoker with  last seen by Dr Lake Bells 01/19/13 with dx CHF, obesity, prior pulmonary emboli and pulmonary hypertension related to obstructive sleep apnea admit by [facility] 10/9 with one week of worsening resting and noct sob  with dx of OHS/ ? CAP but not better on bipap so pccm svc asked to see am 10/11 PMH - DVT on the left and PE in 6060, diastolic HF on chronic diuretics She uses CPAP at 7- 10 cm of water every night. At baseline she is on 2.5 L of oxygen continuously, 4L on exertion.      SUBJECTIVE:  On VM this am. Sl better oxygenation bipap tol well overnight Son at [place]: Temp:  [97.4 F (36.3 C)-98 F (36.7 C)] 97.4 F (36.3 C) (10/18 0734) Pulse Rate:  [62-80] 64 (10/18 0734) Resp:  [17-34] 20 (10/18 0734) BP: (99-132)/(49-85) 116/55 mmHg (10/18 0734) SpO2:  [88 %-100 %] 95 % (10/18 0734) FiO2 (%):  [100 %] 100 % (10/17 1632) Weight:  [121.5 kg (267 lb 13.7 oz)] 121.5 kg (267 lb 13.7 oz) (10/18 0415)    VENTILATOR SETTINGS: Vent Mode:  [-]  FiO2 (%):  [100 %] 100 % INTAKE / OUTPUT:  Intake/Output Summary (Last 24 hours) at 02/27/14 0850 Last data filed at 02/27/14 0747  Gross per 24 hour  Intake   1016 ml  Output   3825 ml  Net  -2809 ml    PHYSICAL EXAMINATION: General:  NAD, RASS 0 No focal deficits HEENT WNL JVP not well visualized Distant BS, decreased rales RRR s M Abd obese, soft, +BS Ext warm with no edema   LABS: I have reviewed all of today's lab results. Relevant abnormalities are discussed in the A/P section  CXR: 10/18: low lung volumes, edema  KUB : 10/17 Large stool burden  ASSESSMENT: Acute on (mostly) chronic hypercarbic resp failure Acute on chronic hypoxic resp failure -  out of proportion to CXR findings  Doubt PE as pt is anticoagulated  Vague IS prominence - edema vs pneumonitis Markedly elevated ESR of unclear etiology OHS, possible OSA Severe PH -secondary to old PE & diastolic CHF Leukocytosis d/t steroids Off all ABX , neg c/s data.  PLAN: Cont diuresis -lasix 80 q 12h Cont trial of systemic steroids for possible pneumonitis >>taper  Cont nebulized BDs and steroids Noct bipap, O2 daytime Pt remains high risk need for vent S/p course of ABX.  Watch fever curve   Mariel Sleet Beeper  [phone]  Cell  [phone]  If no response or cell goes to voicemail, call beeper [phone] 8:50 AM 02/27/2014

## 2014-02-28 ENCOUNTER — Encounter (HOSPITAL_COMMUNITY)
Admission: EM | Disposition: A | Discharge status: Nursing Home (Includes ICF) | Payer: Self-pay | Source: Home / Self Care | Attending: Internal Medicine

## 2014-02-28 DIAGNOSIS — I270 Primary pulmonary hypertension: Secondary | ICD-10-CM

## 2014-02-28 DIAGNOSIS — J9612 Chronic respiratory failure with hypercapnia: Secondary | ICD-10-CM

## 2014-02-28 HISTORY — PX: RIGHT HEART CATHETERIZATION: SHX5447

## 2014-02-28 LAB — GLUCOSE, CAPILLARY
Glucose-Capillary: 156 mg/dL — ABNORMAL HIGH (ref 70–99)
Glucose-Capillary: 104 mg/dL — ABNORMAL HIGH (ref 70–99)
Glucose-Capillary: 197 mg/dL — ABNORMAL HIGH (ref 70–99)

## 2014-02-28 LAB — POCT I-STAT 3, VENOUS BLOOD GAS (G3P V)
Acid-Base Excess: 25 mmol/L — ABNORMAL HIGH (ref 0.0–2.0)
BICARBONATE: 53.4 meq/L — AB (ref 20.0–24.0)
O2 SAT: 75 %
PCO2 VEN: 67.7 mmHg — AB (ref 45.0–50.0)
PH VEN: 7.505 — AB (ref 7.250–7.300)
PO2 VEN: 39 mmHg (ref 30.0–45.0)

## 2014-02-28 LAB — CBC
HCT: 42.7 % (ref 36.0–46.0)
Hemoglobin: 13.9 g/dL (ref 12.0–15.0)
MCH: 29.3 pg (ref 26.0–34.0)
MCHC: 32.6 g/dL (ref 30.0–36.0)
MCV: 90.1 fL (ref 78.0–100.0)
Platelets: 460 10*3/uL — ABNORMAL HIGH (ref 150–400)
RBC: 4.74 MIL/uL (ref 3.87–5.11)
RDW: 15.8 % — AB (ref 11.5–15.5)
WBC: 16.4 10*3/uL — ABNORMAL HIGH (ref 4.0–10.5)

## 2014-02-28 LAB — PROTIME-INR
INR: 1.87 — ABNORMAL HIGH (ref 0.00–1.49)
Prothrombin Time: 21.7 seconds — ABNORMAL HIGH (ref 11.6–15.2)

## 2014-02-28 LAB — BASIC METABOLIC PANEL
Anion gap: 11 (ref 5–15)
BUN: 98 mg/dL — AB (ref 6–23)
CO2: 43 meq/L — AB (ref 19–32)
CREATININE: 1.17 mg/dL — AB (ref 0.50–1.10)
Calcium: 9.9 mg/dL (ref 8.4–10.5)
Chloride: 83 mEq/L — ABNORMAL LOW (ref 96–112)
GFR calc Af Amer: 50 mL/min — ABNORMAL LOW (ref >90)
GFR calc non Af Amer: 43 mL/min — ABNORMAL LOW (ref >90)
Glucose, Bld: 123 mg/dL — ABNORMAL HIGH (ref 70–99)
Potassium: 4 mEq/L (ref 3.7–5.3)
Sodium: 137 mEq/L (ref 137–147)

## 2014-02-28 LAB — POCT I-STAT 3, ART BLOOD GAS (G3+)
ACID-BASE EXCESS: 27 mmol/L — AB (ref 0.0–2.0)
BICARBONATE: 55.8 meq/L — AB (ref 20.0–24.0)
O2 Saturation: 92 %
PCO2 ART: 67.1 mmHg — AB (ref 35.0–45.0)
TCO2: >50 mmol/L (ref 0–100)
pH, Arterial: 7.528 — ABNORMAL HIGH (ref 7.350–7.450)
pO2, Arterial: 61 mmHg — ABNORMAL LOW (ref 80.0–100.0)

## 2014-02-28 SURGERY — RIGHT HEART CATH
Anesthesia: LOCAL

## 2014-02-28 MED ORDER — MIDAZOLAM HCL 2 MG/2ML IJ SOLN
INTRAMUSCULAR | Status: AC
  Filled 2014-02-28: qty 2

## 2014-02-28 MED ORDER — SODIUM CHLORIDE 0.9 % IJ SOLN: 3.0000 mL | INTRAMUSCULAR | Status: DC | PRN

## 2014-02-28 MED ORDER — SODIUM CHLORIDE 0.9 % IJ SOLN
3.0000 mL | Freq: Two times a day (BID) | INTRAMUSCULAR | Status: DC
  Administered 2014-02-28: 3 mL via INTRAVENOUS

## 2014-02-28 MED ORDER — ASPIRIN 81 MG PO CHEW
81.0000 mg | CHEWABLE_TABLET | ORAL | Status: AC
  Administered 2014-02-28: 81 mg via ORAL
  Filled 2014-02-28: qty 1

## 2014-02-28 MED ORDER — SENNOSIDES-DOCUSATE SODIUM 8.6-50 MG PO TABS
1.0000 | ORAL_TABLET | Freq: Two times a day (BID) | ORAL | Status: DC
  Administered 2014-03-01 – 2014-03-02 (×2): 1 via ORAL
  Filled 2014-02-28 (×4): qty 1

## 2014-02-28 MED ORDER — WARFARIN SODIUM 2.5 MG PO TABS
2.5000 mg | ORAL_TABLET | Freq: Once | ORAL | Status: AC
  Administered 2014-02-28: 2.5 mg via ORAL
  Filled 2014-02-28 (×2): qty 1

## 2014-02-28 MED ORDER — ACETAMINOPHEN 325 MG PO TABS: 650.0000 mg | ORAL_TABLET | ORAL | Status: DC | PRN

## 2014-02-28 MED ORDER — ONDANSETRON HCL 4 MG/2ML IJ SOLN: 4.0000 mg | Freq: Four times a day (QID) | INTRAMUSCULAR | Status: DC | PRN

## 2014-02-28 MED ORDER — SODIUM CHLORIDE 0.9 % IV SOLN
250.0000 mL | INTRAVENOUS | Status: DC | PRN
Start: 2014-02-28 — End: 2014-02-28

## 2014-02-28 MED ORDER — FENTANYL CITRATE 0.05 MG/ML IJ SOLN
INTRAMUSCULAR | Status: AC
  Filled 2014-02-28: qty 2

## 2014-02-28 MED ORDER — SODIUM CHLORIDE 0.9 % IV SOLN
INTRAVENOUS | Status: DC
  Administered 2014-02-28: 10 mL/h via INTRAVENOUS

## 2014-02-28 MED ORDER — SODIUM CHLORIDE 0.9 % IV SOLN: 250.0000 mL | INTRAVENOUS | Status: DC | PRN

## 2014-02-28 MED ORDER — SODIUM CHLORIDE 0.9 % IJ SOLN
3.0000 mL | Freq: Two times a day (BID) | INTRAMUSCULAR | Status: DC
  Administered 2014-03-01 – 2014-03-02 (×3): 3 mL via INTRAVENOUS

## 2014-02-28 MED ORDER — SODIUM CHLORIDE 0.9 % IJ SOLN: 3.0000 mL | Freq: Two times a day (BID) | INTRAMUSCULAR | Status: DC

## 2014-02-28 MED ORDER — POLYETHYLENE GLYCOL 3350 17 G PO PACK
17.0000 g | PACK | Freq: Every day | ORAL | Status: DC | PRN
  Filled 2014-02-28: qty 1

## 2014-02-28 MED ORDER — LIDOCAINE HCL (PF) 1 % IJ SOLN
INTRAMUSCULAR | Status: AC
  Filled 2014-02-28: qty 30

## 2014-02-28 MED ORDER — ASPIRIN 81 MG PO CHEW: 81.0000 mg | CHEWABLE_TABLET | ORAL | Status: DC

## 2014-02-28 MED ORDER — HEPARIN (PORCINE) IN NACL 2-0.9 UNIT/ML-% IJ SOLN
INTRAMUSCULAR | Status: AC
  Filled 2014-02-28: qty 500

## 2014-02-28 MED ORDER — METHYLPREDNISOLONE SODIUM SUCC 40 MG IJ SOLR
40.0000 mg | Freq: Every day | INTRAMUSCULAR | Status: DC
  Administered 2014-03-01 – 2014-03-02 (×2): 40 mg via INTRAVENOUS
  Filled 2014-02-28 (×2): qty 1

## 2014-02-28 NOTE — H&P (View-Only) (Signed)
Advanced Heart Failure Team Consult Note  Referring Physician: Dr Sunnie Nielsenegalado Primary Physician: Dr Pattricia BossBledsoe  Primary Cardiologist:  Dr Mariah MillingGollan   Reason for Consultation: Heart Failure   HPI:   The HF team was asked to consult by Dr Carmell Austriaegaldo for assistance with HF.   [name] [name] is a 80 y.o. female with a history of chronic diastolic heart failure, obstructive sleep apnea on CPAP, history of DVT PE, chronic A. fib on coumadin, coronary artery disease, dyslipidemia, GERD, chronic lower extremity ulcers.  INR has not been therapeutic since August.   Last admitted April 2015 with volume overload. She was diuresed 15 pounds. Discharge weight was 267 pounds.   She was evaluated by Dr Mariah MillingGollan in September. At that time she was euvolemic with weight 261 pounds. She continued on lasix 80 mg twice a day and metolazone as needed.    Prior to admit she had limited mobility with ongoing dyspnea with exertion. Requires assistance with ADLs. Has hospital bed and a day time care giver.   Admitted with increased dyspnea and cough thought to be from CAP. Prior to admit she had increased her home oxygen up to 4 liters from 2.5 liters and had an extra metolazone with ongoing dyspnea.  Oxygen saturations remained in the low 80s an and she was instructed call 911. WBC on 15. Blood cultures obtained. Empiric antibiotics started. Pulmonary consulted due to increased dyspnea not improved with bipap. Oxygenation improved some what with IV lasix + metolazone as well as solumedrol (suspected pneumonitis). She has completed 7 days of Levaquin.   Weight trending up 3 pounds.   Denies SOB/Orthopnea.   CXR 02/27/14 Pulmonary edema noted.  ECHO 02/23/14 EF 60-65% Grade I DD Peak PA pressure 74 mm hg   Review of Systems: [y] = yes, [ ]  = no   General: Weight gain [ ] ; Weight loss [ ] ; Anorexia [ ] ; Fatigue [Y ]; Fever [ ] ; Chills [ ] ; Weakness [Y ]  Cardiac: Chest pain/pressure [ ] ; Resting SOB [ ] ; Exertional SOB [Y ];  Orthopnea [ Y]; Pedal Edema [ ] ; Palpitations [ ] ; Syncope [ ] ; Presyncope [ ] ; Paroxysmal nocturnal dyspnea[ ]   Pulmonary: Cough [ ] ; Wheezing[ ] ; Hemoptysis[ ] ; Sputum [ ] ; Snoring [ ]   GI: Vomiting[ ] ; Dysphagia[ ] ; Melena[ ] ; Hematochezia [ ] ; Heartburn[ ] ; Abdominal pain [ ] ; Constipation [ ] ; Diarrhea [ ] ; BRBPR [ ]   GU: Hematuria[ ] ; Dysuria [ ] ; Nocturia[ ]   Vascular: Pain in legs with walking [ ] ; Pain in feet with lying flat [ ] ; Non-healing sores [ ] ; Stroke [ ] ; TIA [ ] ; Slurred speech [ ] ;  Neuro: Headaches[ ] ; Vertigo[ ] ; Seizures[ ] ; Paresthesias[ ] ;Blurred vision [ ] ; Diplopia [ ] ; Vision changes [ ]   Ortho/Skin: Arthritis [ ] ; Joint pain [Y ]; Muscle pain [ ] ; Joint swelling [ ] ; Back Pain [ ] ; Rash [ ]   Psych: Depression[ ] ; Anxiety[ ]   Heme: Bleeding problems [ ] ; Clotting disorders [ ] ; Anemia [ ]   Endocrine: Diabetes [ ] ; Thyroid dysfunction[ ]   Home Medications Prior to Admission medications   Medication Sig Start Date End Date Taking? Authorizing Provider  allopurinol (ZYLOPRIM) 100 MG tablet Take 1 tablet (100 mg total) by mouth daily. 06/24/13  Yes Amy Michelle NasutiE Bedsole, MD  atorvastatin (LIPITOR) 10 MG tablet Take 1 tablet (10 mg total) by mouth daily. 01/12/14  Yes Amy Michelle NasutiE Bedsole, MD  cetirizine (ZYRTEC ALLERGY) 10 MG tablet Take 10 mg by mouth  daily.     Yes Historical Provider, MD  Difluprednate (DUREZOL) 0.05 % EMUL Apply 1 drop to eye 2 (two) times daily.   Yes Historical Provider, MD  diltiazem (CARDIZEM CD) 120 MG 24 hr capsule Take 1 capsule (120 mg total) by mouth daily. 10/11/13  Yes Antonieta Iba, MD  escitalopram (LEXAPRO) 20 MG tablet Take 1 tablet (20 mg total) by mouth daily. 11/04/13  Yes Amy Michelle Nasuti, MD  flecainide (TAMBOCOR) 100 MG tablet Take 1 tablet (100 mg total) by mouth every 12 (twelve) hours. 01/25/14  Yes Antonieta Iba, MD  furosemide (LASIX) 80 MG tablet Take 1 tablet (80 mg total) by mouth 2 (two) times daily. 12/07/13  Yes Antonieta Iba, MD   ipratropium (ATROVENT HFA) 17 MCG/ACT inhaler Inhale 2 puffs into the lungs 4 (four) times daily. 07/07/13  Yes Amy Michelle Nasuti, MD  metolazone (ZAROXOLYN) 2.5 MG tablet Take 2.5 mg by mouth daily as needed. Only take with increased weight. 11/19/13 11/19/14 Yes Antonieta Iba, MD  moxifloxacin (VIGAMOX) 0.5 % ophthalmic solution Place 1 drop into the left eye 2 (two) times daily.   Yes Historical Provider, MD  NON FORMULARY Oxygen 2.5 liters daily   Yes Historical Provider, MD  OxyCODONE (OXYCONTIN) 10 mg T12A 12 hr tablet Take 1 tablet (10 mg total) by mouth every 12 (twelve) hours. 12/23/13  Yes Amy Michelle Nasuti, MD  potassium chloride (K-DUR) 10 MEQ tablet Take 1 tablet (10 mEq total) by mouth daily. 01/26/14  Yes Antonieta Iba, MD  warfarin (COUMADIN) 2.5 MG tablet Take 1.25-2.5 mg by mouth See admin instructions. 1/2 tablet only on Monday and 1 tablet all the rest of the days   Yes Historical Provider, MD  colchicine 0.6 MG tablet Take 0.6-1.2 mg by mouth daily as needed (for gout flare up).    Historical Provider, MD    Past Medical History: Past Medical History  Diagnosis Date  . Congestive heart failure, unspecified     a. ECHO 6/0: EF 55-60%, mild LVH, mildly dilated RV w. mildly dec fx, RVSP 67  . Chronic atrial fibrillation     failed cardioversion in past  -repeat DC-CV on October 5th, 2010  . Personal history of DVT (deep vein thrombosis) 2008  . Obstructive sleep apnea   . HTN (hypertension)   . Morbid obesity   . Shingles     with postherpetic neuraigia  . GERD (gastroesophageal reflux disease)   . Hyperlipidemia   . Meningitis 1950s  . Gallstones     s/p cholecystectomy  . CAD (coronary artery disease)     nonobstructive by cath 8/10 - LAD 40-50% w mild PAH mean 29 w PVR 3.2 [place]  . AAA (abdominal aortic aneurysm)     small  . PE (pulmonary embolism)     history of  . Anemia     Past Surgical History: Past Surgical History  Procedure Laterality Date  .  Appendectomy  1947  . Tonsillectomy  1946  . Hospitalized with meningitis at [facility]  1960  . Cholecystectomy    . Cardioversion  01/2007    x2    Family History: Family History  Problem Relation Age of Onset  . Heart failure Mother   . Depression Mother   . Ovarian cancer Paternal Grandmother   . Heart disease Paternal Grandfather   . Obesity Other     Social History: History   Social History  . Marital Status: Married  Spouse Name: N/A    Number of Children: N/A  . Years of Education: N/A   Social History Main Topics  . Smoking status: Never Smoker   . Smokeless tobacco: Never Used  . Alcohol Use: Yes     Comment: occasional  . Drug Use: No  . Sexual Activity: None   Other Topics Concern  . None   Social History Narrative   Widowed. Retired from Teacher, English as a foreign language.    Allergies:  Allergies  Allergen Reactions  . Prednisone     Eyes swelling Pt is not sure if it was prednisone.     Objective:    Vital Signs:   Temp:  [97.4 F (36.3 C)-98 F (36.7 C)] 98 F (36.7 C) (10/19 0400) Pulse Rate:  [63-74] 63 (10/19 0400) Resp:  [15-28] 23 (10/19 0400) BP: (109-137)/(51-64) 115/52 mmHg (10/19 0400) SpO2:  [90 %-99 %] 96 % (10/19 0400) FiO2 (%):  [40 %-55 %] 40 % (10/18 2048) Weight:  [270 lb 11.6 oz (122.8 kg)] 270 lb 11.6 oz (122.8 kg) (10/19 0400) Last BM Date: 02/27/14  Weight change: Filed Weights   02/26/14 0407 02/27/14 0415 02/28/14 0400  Weight: 265 lb 3.4 oz (120.3 kg) 267 lb 13.7 oz (121.5 kg) 270 lb 11.6 oz (122.8 kg)    Intake/Output:   Intake/Output Summary (Last 24 hours) at 02/28/14 0733 Last data filed at 02/28/14 0403  Gross per 24 hour  Intake    120 ml  Output   3025 ml  Net  -2905 ml     Physical Exam: General:  Well appearing. No resp difficulty HEENT: normal Neck: supple. JVP thick neck difficult to assess but does not appear elevated. . Carotids 2+ bilat; no bruits. No lymphadenopathy or thryomegaly appreciated. Cor:  PMI nondisplaced. Regular rate & rhythm. No rubs, gallops or murmurs. Lungs: clear. Venturi mask.  Abdomen: obese soft, nontender, nondistended. No hepatosplenomegaly. No bruits or masses. Good bowel sounds. Extremities: no cyanosis, clubbing, rash, edema RLE anterior  aspect soft foam dressing.  Neuro: alert & orientedx3, cranial nerves grossly intact. moves all 4 extremities w/o difficulty. Affect pleasant  Telemetry: NSR 60s  Labs: Basic Metabolic Panel:  Recent Labs Lab 02/24/14 0900 02/25/14 0319 02/26/14 0302 02/27/14 0323 02/28/14 0415  NA 144 142 140 139 137  K 3.4* 3.9 3.7 4.1 4.0  CL 90* 88* 85* 84* 83*  CO2 43* 43* 44* 45* 43*  GLUCOSE 137* 132* 115* 135* 123*  BUN 73* 83* 85* 95* 98*  CREATININE 1.21* 1.20* 1.20* 1.26* 1.17*  CALCIUM 10.5 10.3 10.4 10.0 9.9    Liver Function Tests: No results found for this basename: AST, ALT, ALKPHOS, BILITOT, PROT, ALBUMIN,  in the last 168 hours No results found for this basename: LIPASE, AMYLASE,  in the last 168 hours No results found for this basename: AMMONIA,  in the last 168 hours  CBC:  Recent Labs Lab 02/24/14 0900 02/25/14 0319 02/26/14 0302 02/27/14 0323  WBC 9.8 15.2* 17.1* 19.2*  HGB 11.9* 11.9* 12.5 13.1  HCT 37.4 38.1 39.9 40.6  MCV 91.0 91.1 90.1 89.4  PLT 327 370 404* 421*    Cardiac Enzymes: No results found for this basename: CKTOTAL, CKMB, CKMBINDEX, TROPONINI,  in the last 168 hours  BNP: BNP (last 3 results)  Recent Labs  02/18/14 1609 02/22/14 0947 02/24/14 0332  PROBNP 1539.0* 2319.0* 2410.0*    CBG:  Recent Labs Lab 02/26/14 1709 02/26/14 2135 02/27/14 0737 02/27/14 1216 02/27/14 2124  GLUCAP  124* 232* 169* 166* 327*    Coagulation Studies:  Recent Labs  02/26/14 0302 02/27/14 0323 02/28/14 0415  LABPROT 25.1* 25.9* 21.7*  INR 2.25* 2.35* 1.87*    Other results: EKG: SR 77 bpm Imaging: Dg Abd 1 View  02/26/2014   CLINICAL DATA:  Lower abdominal pain and  constipation.  EXAM: ABDOMEN - 1 VIEW  COMPARISON:  None.  FINDINGS: No free intraperitoneal air is identified. The bowel gas pattern is nonobstructive. Large stool burden throughout the colon noted.  IMPRESSION: No acute finding.  Large colonic stool burden.   Electronically Signed   By: Drusilla Kanner M.D.   On: 02/26/2014 13:36   Dg Chest Port 1 View  02/27/2014   CLINICAL DATA:  Chronic respiratory failure with hypercapnia.  EXAM: PORTABLE CHEST - 1 VIEW  COMPARISON:  02/24/2014  FINDINGS: The heart size appears moderately enlarged. There is a left pleural effusion moderate pulmonary edema. This is unchanged from previous exam. Diminished lung volumes and pulmonary vascular congestion is unchanged.  IMPRESSION: 1. Stable left effusion and pulmonary vascular congestion.   Electronically Signed   By: Signa Kell M.D.   On: 02/27/2014 09:37      Medications:     Current Medications: . allopurinol  100 mg Oral Daily  . antiseptic oral rinse  7 mL Mouth Rinse q12n4p  . atorvastatin  10 mg Oral Daily  . bisacodyl  5 mg Oral Daily  . budesonide (PULMICORT) nebulizer solution  0.5 mg Nebulization BID  . chlorhexidine  15 mL Mouth Rinse BID  . diclofenac sodium  2 g Topical QID  . diltiazem  120 mg Oral Daily  . escitalopram  20 mg Oral Daily  . feeding supplement (GLUCERNA SHAKE)  237 mL Oral TID BM  . flecainide  100 mg Oral Q12H  . furosemide  80 mg Intravenous Q12H  . insulin aspart  0-5 Units Subcutaneous QHS  . insulin aspart  0-9 Units Subcutaneous TID WC  . levalbuterol  0.63 mg Nebulization QID  . methylPREDNISolone (SOLU-MEDROL) injection  40 mg Intravenous Q12H  . polyethylene glycol  17 g Oral BID  . senna-docusate  2 tablet Oral BID  . Warfarin - Pharmacist Dosing Inpatient   Does not apply q1800     Infusions:      Assessment/Plan    1. A/C Respiratory Failure - On chronic 2.5 liters [place] continuously prior to admit. Per pulmonary on solumedrol taper. BIPAP at  night.  Will need RHC today to further assess hemodynamics to help sort out dyspnea.  2. A/C Diastolic Heart Failure- ECHO EF 60-65% Grade I DD Peak PA pressure 74 mmhg  Volume status ok. BUN trending up. Stop IV lasix and  3. PAF - Maintaining NSR. on diltiazem and flecainide. On chronic coumadin. INR 1.8 Pharmacy dosing.  4. History of PE in 2008 - on chronic coumadin  5. OSA- on BiPap nightly per pulmonary  Length of Stay: 10  CLEGG,AMY NP-C  02/28/2014, 7:33 AM  Advanced Heart Failure Team Pager [phone] (M-F; 7a - 4p)  Please contact [facility] for night-coverage after hours (4p -7a ) and weekends on [email or web address]  Patient seen with NP, agree with the above note.  She has history of OHS/OSA, paroxysmal atrial fibrillation on flecainide, diastolic CHF, prior DVT/PE, CKD.  She was admitted with acute on chronic hypoxemic/hypercarbic respiratory failure.  She has been treated with antibiotics for ?PNA, Bipap for OHS/OSA, steroids (solumedrol) and diuretics for possible component of CHF.  She has diuresed reasonably well.  She remains on oxygen by facemask (on [place] at home).  1. Acute on chronic hypoxemic/hypercarbic respiratory failure: She has been treated with antibiotics, steroids, nebs, Bipap, and diuretics.  On exam currently, she does not appear significantly volume overloaded.  It may be that she developed a respiratory infection with decompensation of tenuous OHS/OSA.  2. CHF: Presumed acute on chronic diastolic CHF.  Echo showed severe pulmonary hypertension.  On exam, she does not appear particularly volume overloaded at this point.  She needs RHC to assess volume status.  Will hold Lasix for now with rise in BUN to 90s.  3. Pulmonary hypertension: Multiple possible reasons for this.  OHS/OSA likely plays a large role.  Also has history of PE so would question possible CTEPH.  Needs RHC to better define pulmonary pressure and left heart filling pressure. Would consider treatment with  Revatio in setting of PAH if PCWP not too high.  Will get V/Q scan to assess for chronic PE . 4. Atrial fibrillation: Has been on coumadin and flecainide.  I am concerned about flecainide use with presumed right heart failure and CKD.  I am going to stop this.  Will start her on low dose amiodarone to maintain NSR.  5. CKD: Creatinine not elevated but BUN very high.  As above, hold diuretics until we see RHC results.   Long-term, I think prognosis is fairly poor at this point.   Marca AnconaDalton McLean 02/28/2014 8:51 AM

## 2014-02-28 NOTE — Progress Notes (Signed)
PULMONARY / CRITICAL CARE MEDICINE   Name: [name] [name] MRN: [id number] DOB: 1932-01-09    ADMISSION DATE:  02/18/2014 CONSULTATION DATE:  10/11/5   REFERRING MD :  [facility]  CHIEF COMPLAINT:  sob  INITIAL PRESENTATION:  80 yowf obese, never smoker, last seen by Dr Lake Bells 01/19/13 with dx CHF, obesity, prior pulmonary emboli and pulmonary hypertension related to obstructive sleep apnea admit by [facility] 10/9 with one week of worsening resting and noct sob with dx of OHS/ ? CAP but not better on bipap so pccm svc asked to see am 10/11.    PMH - DVT on the left and PE in 2482, diastolic HF on chronic diuretics She uses CPAP at 7- 10 cm of water every night. At baseline she is on 2.5 L of oxygen continuously, 4L on exertion.      SUBJECTIVE:  Remains on 40% VM, more alert.  Wore CPAP overnight without difficulty.  Pending RHC.  VITAL SIGNS: Temp:  [96.4 F (35.8 C)-98 F (36.7 C)] 97.1 F (36.2 C) (10/19 1223) Pulse Rate:  [57-74] 68 (10/19 1129) Resp:  [15-28] 21 (10/19 1129) BP: (109-137)/(51-70) 115/62 mmHg (10/19 1223) SpO2:  [90 %-99 %] 92 % (10/19 1129) FiO2 (%):  [40 %-50 %] 40 % (10/19 1127) Weight:  [254 lb 3.1 oz (115.3 kg)-270 lb 11.6 oz (122.8 kg)] 254 lb 3.1 oz (115.3 kg) (10/19 1156)  VENTILATOR SETTINGS: Vent Mode:  [-]  FiO2 (%):  [40 %-50 %] 40 %  INTAKE / OUTPUT:  Intake/Output Summary (Last 24 hours) at 02/28/14 1231 Last data filed at 02/28/14 1112  Gross per 24 hour  Intake    120 ml  Output   3600 ml  Net  -3480 ml    PHYSICAL EXAMINATION: General:  Morbidly obese female in NAD, RASS 0.  No focal deficits HEENT WNL RESP: Distant BS, decreased rales CV:  RRR s M GI/GU: Abd obese, soft, +BS Ext warm with no overt edema, RLE anterior lower leg with healing scar (>2 yr old wound from prior fall)   LABS:  Recent Labs Lab 02/26/14 0302 02/27/14 0323 02/28/14 0840  HGB 12.5 13.1 13.9  HCT 39.9 40.6 42.7  WBC 17.1* 19.2* 16.4*  PLT 404* 421* 460*     Recent Labs Lab 02/24/14 0900 02/25/14 0319 02/26/14 0302 02/27/14 0323 02/28/14 0415  NA 144 142 140 139 137  K 3.4* 3.9 3.7 4.1 4.0  CL 90* 88* 85* 84* 83*  CO2 43* 43* 44* 45* 43*  GLUCOSE 137* 132* 115* 135* 123*  BUN 73* 83* 85* 95* 98*  CREATININE 1.21* 1.20* 1.20* 1.26* 1.17*  CALCIUM 10.5 10.3 10.4 10.0 9.9    CXR: 10/18: low lung volumes, edema  KUB: 10/17 Large stool burden  ASSESSMENT: Acute on (mostly) chronic hypercarbic resp failure Acute on chronic hypoxic resp failure - out of proportion to CXR findings, doubt PE as pt is anticoagulated, vague IS prominence - edema vs pneumonitis Markedly elevated ESR of unclear etiology OHS, possible OSA Severe PH - secondary to old PE & diastolic CHF Leukocytosis d/t steroids Off all ABX , neg c/s data.  PLAN: Cont diuresis - lasix 80 q 12h Cont trial of systemic steroids for possible pneumonitis >> decrease to 40 QD 10/19 Cont nebulized BDs and steroids Noct bipap, O2 daytime Pt remains high risk need for vent, concern for RHC and lying flat.  Would recommend CPAP during case.  S/p course of ABX.  Watch fever curve Await  RHC, though not sure [place] Rx would be beneficial  Noe Gens, NP-C [facility] Pgr: [phone] or [phone]    12:31 PM 02/28/2014   Attending:  I have seen and examined the patient with nurse practitioner/resident and agree with the note above.   Lungs with crackles, breathing comfortably, cardiac exam with edema, neuro exam awake and alert  This lady is very deconditioned at baseline.  Her prognosis is poor.  Will f/u RHC results with you.  Roselie Awkward, MD [facility] Pager: [phone] Cell: [phone] If no response, call [phone]

## 2014-02-28 NOTE — Progress Notes (Signed)
Pt is going to cardiac cath lab shortly. Two nurses attempted to get a 2nd IV and both failed at 2 attempts each. Called IV team to attempt.

## 2014-02-28 NOTE — Progress Notes (Signed)
[facility] PROGRESS NOTE  [name] [name] LKT:[id number] DOB: Sep 25, 1931 DOA: 02/18/2014 PCP: Eliezer Lofts, MD Brief narrative 80 y.o. female with Past medical history of chronic diastolic heart failure, obstructive sleep apnea on CPAP, on chr home o2 ( 2.5L [place] ) history of DVT& PE in 2008, chronic A. fib on coumadin , coronary artery disease, dyslipidemia, GERD, chronic lower extremity ulcers presenting with complaints of cough and shortness of breath along with hypoxia. Patient progressively short of breath for past 3-4 weeks.  Patient admitted to SDU with acute on chr hypoxic resp failure secondary to CAP  Assessment/Plan: Acute on chr hypoxic resp failure;   -Secondary to CAP vs volume overload vs? Pneumonitis, pulmonary HTN., Heart Failure. . possble b/l infiltrate on admission CXR. -Blood cx, urine strep and legionella all negative.  - Scheduled xopenex neb and antitussives. Added Pulmicort neb twice a day. -Patient had worsening  short of breath with followup x-ray showing increased pulmonary edema on 10-12. Elevated proBNP noted. ESR also elevated.  -Repeat Check 2-D echo with bubble study as recommended by pulmonary.  -ECHO with severe pulmonary Hypertension.  -appreciate pulmonary evaluation and recommendations.   -Continue with lasix 80 mg BID.  -Completed Levaquin for 7 days on 9-15.  -Solumedrol 80 Mg BID on 1-14. Change to 40 mg BID 10-17 -using BIPAP at night only. On Non-rebreder.  -Appreciate cardiology evaluation. Plan for right side Heart cath. V-Q scan to evaluate for chronic PE.  -need to be cautious with medication, sedatives.   Constipation; dulcolax suppository, miralax. , patient was disimpacted for large amt of soft brown stool. XY negative for obstruction.   Leukocytosis; on steroids, afebrile. X ray stable. Finished Levaquin. Check UA. WBC trending down.   Encephalopathy: resolved.  Multifactorial, opioids, Vs hypercapnia.   Acute on Chronic diastolic  heart failure exacerbation.  Continue with zaroxyline. Monitor I/O and daily wts Holding V lasix 80 mg BID and Metolazone, due to increasing BUN.  Chest x ray 10-15 evidence of heart failure, left lobe consolidation.  Negative 10 L.   History of A. fib and PE PE in 2008. Currently in sinus. coumadin dose per pharmacy continue Cardizem and flecainide INR 1.8, if continue to be low might need bridge.   Acute gouty arthritis; resolved.  continue with topical Voltaren gel. Holding  home dose Oxycodone to avoid oversedation. Resolved.   CKD stage III -Cr per prior records at 1.4 to 1.7. -Holding  IV lasix dose -Cr stable, trending down. Today at 1.2 -BUN increasing.   Dyslipidemia  cont statin   Code Status: FULL CODE Family Communication: Care discussed with son [name].  Disposition Plan: remain in the step down.    Consultants:  CCM.   Cardiology.   Procedures:  ECHO  Antibiotics:  Levaquin ( 10/9--)  HPI/Subjective: Breathing ok, no worsening dyspnea. She has some concern with sedation during cath.    Objective: Filed Vitals:   02/28/14 0845  BP: 123/57  Pulse: 61  Temp:   Resp: 15    Intake/Output Summary (Last 24 hours) at 02/28/14 1052 Last data filed at 02/28/14 0403  Gross per 24 hour  Intake    120 ml  Output   2125 ml  Net  -2005 ml   Filed Weights   02/26/14 0407 02/27/14 0415 02/28/14 0400  Weight: 120.3 kg (265 lb 3.4 oz) 121.5 kg (267 lb 13.7 oz) 122.8 kg (270 lb 11.6 oz)    Exam:   General:  Elderly obese female no distress.  HEENT:  moist oral mucosa  chest: bilateral crackles.   CVS: NS1&S2, no murmurs  Abd: soft, NT, ND, BS+  Ext: trace edema.   CNS: Alert following command.    Data Reviewed: Basic Metabolic Panel:  Recent Labs Lab 02/24/14 0900 02/25/14 0319 02/26/14 0302 02/27/14 0323 02/28/14 0415  NA 144 142 140 139 137  K 3.4* 3.9 3.7 4.1 4.0  CL 90* 88* 85* 84* 83*  CO2 43* 43* 44* 45* 43*  GLUCOSE  137* 132* 115* 135* 123*  BUN 73* 83* 85* 95* 98*  CREATININE 1.21* 1.20* 1.20* 1.26* 1.17*  CALCIUM 10.5 10.3 10.4 10.0 9.9   Liver Function Tests: No results found for this basename: AST, ALT, ALKPHOS, BILITOT, PROT, ALBUMIN,  in the last 168 hours No results found for this basename: LIPASE, AMYLASE,  in the last 168 hours No results found for this basename: AMMONIA,  in the last 168 hours CBC:  Recent Labs Lab 02/24/14 0900 02/25/14 0319 02/26/14 0302 02/27/14 0323 02/28/14 0840  WBC 9.8 15.2* 17.1* 19.2* 16.4*  HGB 11.9* 11.9* 12.5 13.1 13.9  HCT 37.4 38.1 39.9 40.6 42.7  MCV 91.0 91.1 90.1 89.4 90.1  PLT 327 370 404* 421* 460*   Cardiac Enzymes: No results found for this basename: CKTOTAL, CKMB, CKMBINDEX, TROPONINI,  in the last 168 hours BNP (last 3 results)  Recent Labs  02/18/14 1609 02/22/14 0947 02/24/14 0332  PROBNP 1539.0* 2319.0* 2410.0*   CBG:  Recent Labs Lab 02/26/14 2135 02/27/14 0737 02/27/14 1216 02/27/14 2124 02/28/14 0738  GLUCAP 232* 169* 166* 327* 156*    Recent Results (from the past 240 hour(s))  CULTURE, BLOOD (ROUTINE X 2)     Status: None   Collection Time    02/18/14  9:22 PM      Result Value Ref Range Status   Specimen Description BLOOD RIGHT ARM   Final   Special Requests BOTTLES DRAWN AEROBIC AND ANAEROBIC 10CC EACH   Final   Culture  Setup Time     Final   Value: 02/19/2014 03:20     Performed at [facility]   Culture     Final   Value: NO GROWTH 5 DAYS     Performed at [facility]   Report Status 02/25/2014 FINAL   Final  CULTURE, BLOOD (ROUTINE X 2)     Status: None   Collection Time    02/18/14  9:28 PM      Result Value Ref Range Status   Specimen Description BLOOD RIGHT FOREARM   Final   Special Requests BOTTLES DRAWN AEROBIC AND ANAEROBIC [facility]   Final   Culture  Setup Time     Final   Value: 02/19/2014 03:19     Performed at [facility]   Culture     Final   Value: NO GROWTH  5 DAYS     Performed at [facility]   Report Status 02/25/2014 FINAL   Final  MRSA PCR SCREENING     Status: None   Collection Time    02/18/14 10:34 PM      Result Value Ref Range Status   MRSA by PCR NEGATIVE  NEGATIVE Final   Comment:            The GeneXpert MRSA Assay (FDA     approved for NASAL specimens     only), is one component of a     comprehensive MRSA colonization  surveillance program. It is not     intended to diagnose MRSA     infection nor to guide or     monitor treatment for     MRSA infections.  CULTURE, EXPECTORATED SPUTUM-ASSESSMENT     Status: None   Collection Time    02/24/14  9:20 PM      Result Value Ref Range Status   Specimen Description SPUTUM   Final   Special Requests NONE   Final   Sputum evaluation     Final   Value: THIS SPECIMEN IS ACCEPTABLE. RESPIRATORY CULTURE REPORT TO FOLLOW.   Report Status 02/24/2014 FINAL   Final  CULTURE, RESPIRATORY (NON-EXPECTORATED)     Status: None   Collection Time    02/24/14  9:20 PM      Result Value Ref Range Status   Specimen Description SPUTUM   Final   Special Requests NONE   Final   Gram Stain     Final   Value: RARE WBC PRESENT, PREDOMINANTLY MONONUCLEAR     RARE SQUAMOUS EPITHELIAL CELLS PRESENT     MODERATE GRAM POSITIVE COCCI     IN PAIRS IN CLUSTERS     Performed at [facility]   Culture     Final   Value: NORMAL OROPHARYNGEAL FLORA     Performed at [facility]   Report Status 02/27/2014 FINAL   Final     Studies: Dg Abd 1 View  02/26/2014   CLINICAL DATA:  Lower abdominal pain and constipation.  EXAM: ABDOMEN - 1 VIEW  COMPARISON:  None.  FINDINGS: No free intraperitoneal air is identified. The bowel gas pattern is nonobstructive. Large stool burden throughout the colon noted.  IMPRESSION: No acute finding.  Large colonic stool burden.   Electronically Signed   By: Inge Rise M.D.   On: 02/26/2014 13:36   Dg Chest Port 1 View  02/27/2014   CLINICAL  DATA:  Chronic respiratory failure with hypercapnia.  EXAM: PORTABLE CHEST - 1 VIEW  COMPARISON:  02/24/2014  FINDINGS: The heart size appears moderately enlarged. There is a left pleural effusion moderate pulmonary edema. This is unchanged from previous exam. Diminished lung volumes and pulmonary vascular congestion is unchanged.  IMPRESSION: 1. Stable left effusion and pulmonary vascular congestion.   Electronically Signed   By: Kerby Moors M.D.   On: 02/27/2014 09:37    Scheduled Meds: . allopurinol  100 mg Oral Daily  . antiseptic oral rinse  7 mL Mouth Rinse q12n4p  . atorvastatin  10 mg Oral Daily  . bisacodyl  5 mg Oral Daily  . budesonide (PULMICORT) nebulizer solution  0.5 mg Nebulization BID  . chlorhexidine  15 mL Mouth Rinse BID  . diclofenac sodium  2 g Topical QID  . diltiazem  120 mg Oral Daily  . escitalopram  20 mg Oral Daily  . feeding supplement (GLUCERNA SHAKE)  237 mL Oral TID BM  . insulin aspart  0-5 Units Subcutaneous QHS  . insulin aspart  0-9 Units Subcutaneous TID WC  . levalbuterol  0.63 mg Nebulization QID  . methylPREDNISolone (SOLU-MEDROL) injection  40 mg Intravenous Q12H  . polyethylene glycol  17 g Oral BID  . senna-docusate  2 tablet Oral BID  . warfarin  2.5 mg Oral ONCE-1800  . Warfarin - Pharmacist Dosing Inpatient   Does not apply q1800   Continuous Infusions:      Time spent: 25 minutes    Katrinka Herbison A  [facility] Pager [phone] If 7PM-7AM, please contact night-coverage at [email or web address], password [facility] 02/28/2014, 10:52 AM  LOS: 10 days

## 2014-02-28 NOTE — Consult Note (Signed)
Advanced Heart Failure Team Consult Note  Referring Physician: Dr Sunnie Nielsenegalado Primary Physician: Dr Pattricia BossBledsoe  Primary Cardiologist:  Dr Mariah MillingGollan   Reason for Consultation: Heart Failure   HPI:   The HF team was asked to consult by Dr Carmell Austriaegaldo for assistance with HF.   Aneta Minseggy T Eickhoff is a 80 y.o. female with a history of chronic diastolic heart failure, obstructive sleep apnea on CPAP, history of DVT PE, chronic A. fib on coumadin, coronary artery disease, dyslipidemia, GERD, chronic lower extremity ulcers.  INR has not been therapeutic since August.   Last admitted April 2015 with volume overload. She was diuresed 15 pounds. Discharge weight was 267 pounds.   She was evaluated by Dr Mariah MillingGollan in September. At that time she was euvolemic with weight 261 pounds. She continued on lasix 80 mg twice a day and metolazone as needed.    Prior to admit she had limited mobility with ongoing dyspnea with exertion. Requires assistance with ADLs. Has hospital bed and a day time care giver.   Admitted with increased dyspnea and cough thought to be from CAP. Prior to admit she had increased her home oxygen up to 4 liters from 2.5 liters and had an extra metolazone with ongoing dyspnea.  Oxygen saturations remained in the low 80s an and she was instructed call 911. WBC on 15. Blood cultures obtained. Empiric antibiotics started. Pulmonary consulted due to increased dyspnea not improved with bipap. Oxygenation improved some what with IV lasix + metolazone as well as solumedrol (suspected pneumonitis). She has completed 7 days of Levaquin.   Weight trending up 3 pounds.   Denies SOB/Orthopnea.   CXR 02/27/14 Pulmonary edema noted.  ECHO 02/23/14 EF 60-65% Grade I DD Peak PA pressure 74 mm hg   Review of Systems: [y] = yes, [ ]  = no   General: Weight gain [ ] ; Weight loss [ ] ; Anorexia [ ] ; Fatigue [Y ]; Fever [ ] ; Chills [ ] ; Weakness [Y ]  Cardiac: Chest pain/pressure [ ] ; Resting SOB [ ] ; Exertional SOB [Y ];  Orthopnea [ Y]; Pedal Edema [ ] ; Palpitations [ ] ; Syncope [ ] ; Presyncope [ ] ; Paroxysmal nocturnal dyspnea[ ]   Pulmonary: Cough [ ] ; Wheezing[ ] ; Hemoptysis[ ] ; Sputum [ ] ; Snoring [ ]   GI: Vomiting[ ] ; Dysphagia[ ] ; Melena[ ] ; Hematochezia [ ] ; Heartburn[ ] ; Abdominal pain [ ] ; Constipation [ ] ; Diarrhea [ ] ; BRBPR [ ]   GU: Hematuria[ ] ; Dysuria [ ] ; Nocturia[ ]   Vascular: Pain in legs with walking [ ] ; Pain in feet with lying flat [ ] ; Non-healing sores [ ] ; Stroke [ ] ; TIA [ ] ; Slurred speech [ ] ;  Neuro: Headaches[ ] ; Vertigo[ ] ; Seizures[ ] ; Paresthesias[ ] ;Blurred vision [ ] ; Diplopia [ ] ; Vision changes [ ]   Ortho/Skin: Arthritis [ ] ; Joint pain [Y ]; Muscle pain [ ] ; Joint swelling [ ] ; Back Pain [ ] ; Rash [ ]   Psych: Depression[ ] ; Anxiety[ ]   Heme: Bleeding problems [ ] ; Clotting disorders [ ] ; Anemia [ ]   Endocrine: Diabetes [ ] ; Thyroid dysfunction[ ]   Home Medications Prior to Admission medications   Medication Sig Start Date End Date Taking? Authorizing Provider  allopurinol (ZYLOPRIM) 100 MG tablet Take 1 tablet (100 mg total) by mouth daily. 06/24/13  Yes Amy Michelle NasutiE Bedsole, MD  atorvastatin (LIPITOR) 10 MG tablet Take 1 tablet (10 mg total) by mouth daily. 01/12/14  Yes Amy Michelle NasutiE Bedsole, MD  cetirizine (ZYRTEC ALLERGY) 10 MG tablet Take 10 mg by mouth  daily.     Yes Historical Provider, MD  Difluprednate (DUREZOL) 0.05 % EMUL Apply 1 drop to eye 2 (two) times daily.   Yes Historical Provider, MD  diltiazem (CARDIZEM CD) 120 MG 24 hr capsule Take 1 capsule (120 mg total) by mouth daily. 10/11/13  Yes Antonieta Iba, MD  escitalopram (LEXAPRO) 20 MG tablet Take 1 tablet (20 mg total) by mouth daily. 11/04/13  Yes Amy Michelle Nasuti, MD  flecainide (TAMBOCOR) 100 MG tablet Take 1 tablet (100 mg total) by mouth every 12 (twelve) hours. 01/25/14  Yes Antonieta Iba, MD  furosemide (LASIX) 80 MG tablet Take 1 tablet (80 mg total) by mouth 2 (two) times daily. 12/07/13  Yes Antonieta Iba, MD   ipratropium (ATROVENT HFA) 17 MCG/ACT inhaler Inhale 2 puffs into the lungs 4 (four) times daily. 07/07/13  Yes Amy Michelle Nasuti, MD  metolazone (ZAROXOLYN) 2.5 MG tablet Take 2.5 mg by mouth daily as needed. Only take with increased weight. 11/19/13 11/19/14 Yes Antonieta Iba, MD  moxifloxacin (VIGAMOX) 0.5 % ophthalmic solution Place 1 drop into the left eye 2 (two) times daily.   Yes Historical Provider, MD  NON FORMULARY Oxygen 2.5 liters daily   Yes Historical Provider, MD  OxyCODONE (OXYCONTIN) 10 mg T12A 12 hr tablet Take 1 tablet (10 mg total) by mouth every 12 (twelve) hours. 12/23/13  Yes Amy Michelle Nasuti, MD  potassium chloride (K-DUR) 10 MEQ tablet Take 1 tablet (10 mEq total) by mouth daily. 01/26/14  Yes Antonieta Iba, MD  warfarin (COUMADIN) 2.5 MG tablet Take 1.25-2.5 mg by mouth See admin instructions. 1/2 tablet only on Monday and 1 tablet all the rest of the days   Yes Historical Provider, MD  colchicine 0.6 MG tablet Take 0.6-1.2 mg by mouth daily as needed (for gout flare up).    Historical Provider, MD    Past Medical History: Past Medical History  Diagnosis Date  . Congestive heart failure, unspecified     a. ECHO 6/0: EF 55-60%, mild LVH, mildly dilated RV w. mildly dec fx, RVSP 67  . Chronic atrial fibrillation     failed cardioversion in past  -repeat DC-CV on October 5th, 2010  . Personal history of DVT (deep vein thrombosis) 2008  . Obstructive sleep apnea   . HTN (hypertension)   . Morbid obesity   . Shingles     with postherpetic neuraigia  . GERD (gastroesophageal reflux disease)   . Hyperlipidemia   . Meningitis 1950s  . Gallstones     s/p cholecystectomy  . CAD (coronary artery disease)     nonobstructive by cath 8/10 - LAD 40-50% w mild PAH mean 29 w PVR 3.2 [place]  . AAA (abdominal aortic aneurysm)     small  . PE (pulmonary embolism)     history of  . Anemia     Past Surgical History: Past Surgical History  Procedure Laterality Date  .  Appendectomy  1947  . Tonsillectomy  1946  . Hospitalized with meningitis at [place] [facility]  1960  . Cholecystectomy    . Cardioversion  01/2007    x2    Family History: Family History  Problem Relation Age of Onset  . Heart failure Mother   . Depression Mother   . Ovarian cancer Paternal Grandmother   . Heart disease Paternal Grandfather   . Obesity Other     Social History: History   Social History  . Marital Status: Married  Spouse Name: N/A    Number of Children: N/A  . Years of Education: N/A   Social History Main Topics  . Smoking status: Never Smoker   . Smokeless tobacco: Never Used  . Alcohol Use: Yes     Comment: occasional  . Drug Use: No  . Sexual Activity: None   Other Topics Concern  . None   Social History Narrative   Widowed. Retired from Teacher, English as a foreign language.    Allergies:  Allergies  Allergen Reactions  . Prednisone     Eyes swelling Pt is not sure if it was prednisone.     Objective:    Vital Signs:   Temp:  [97.4 F (36.3 C)-98 F (36.7 C)] 98 F (36.7 C) (10/19 0400) Pulse Rate:  [63-74] 63 (10/19 0400) Resp:  [15-28] 23 (10/19 0400) BP: (109-137)/(51-64) 115/52 mmHg (10/19 0400) SpO2:  [90 %-99 %] 96 % (10/19 0400) FiO2 (%):  [40 %-55 %] 40 % (10/18 2048) Weight:  [270 lb 11.6 oz (122.8 kg)] 270 lb 11.6 oz (122.8 kg) (10/19 0400) Last BM Date: 02/27/14  Weight change: Filed Weights   02/26/14 0407 02/27/14 0415 02/28/14 0400  Weight: 265 lb 3.4 oz (120.3 kg) 267 lb 13.7 oz (121.5 kg) 270 lb 11.6 oz (122.8 kg)    Intake/Output:   Intake/Output Summary (Last 24 hours) at 02/28/14 0733 Last data filed at 02/28/14 0403  Gross per 24 hour  Intake    120 ml  Output   3025 ml  Net  -2905 ml     Physical Exam: General:  Well appearing. No resp difficulty HEENT: normal Neck: supple. JVP thick neck difficult to assess but does not appear elevated. . Carotids 2+ bilat; no bruits. No lymphadenopathy or thryomegaly appreciated. Cor:  PMI nondisplaced. Regular rate & rhythm. No rubs, gallops or murmurs. Lungs: clear. Venturi mask.  Abdomen: obese soft, nontender, nondistended. No hepatosplenomegaly. No bruits or masses. Good bowel sounds. Extremities: no cyanosis, clubbing, rash, edema RLE anterior  aspect soft foam dressing.  Neuro: alert & orientedx3, cranial nerves grossly intact. moves all 4 extremities w/o difficulty. Affect pleasant  Telemetry: NSR 60s  Labs: Basic Metabolic Panel:  Recent Labs Lab 02/24/14 0900 02/25/14 0319 02/26/14 0302 02/27/14 0323 02/28/14 0415  NA 144 142 140 139 137  K 3.4* 3.9 3.7 4.1 4.0  CL 90* 88* 85* 84* 83*  CO2 43* 43* 44* 45* 43*  GLUCOSE 137* 132* 115* 135* 123*  BUN 73* 83* 85* 95* 98*  CREATININE 1.21* 1.20* 1.20* 1.26* 1.17*  CALCIUM 10.5 10.3 10.4 10.0 9.9    Liver Function Tests: No results found for this basename: AST, ALT, ALKPHOS, BILITOT, PROT, ALBUMIN,  in the last 168 hours No results found for this basename: LIPASE, AMYLASE,  in the last 168 hours No results found for this basename: AMMONIA,  in the last 168 hours  CBC:  Recent Labs Lab 02/24/14 0900 02/25/14 0319 02/26/14 0302 02/27/14 0323  WBC 9.8 15.2* 17.1* 19.2*  HGB 11.9* 11.9* 12.5 13.1  HCT 37.4 38.1 39.9 40.6  MCV 91.0 91.1 90.1 89.4  PLT 327 370 404* 421*    Cardiac Enzymes: No results found for this basename: CKTOTAL, CKMB, CKMBINDEX, TROPONINI,  in the last 168 hours  BNP: BNP (last 3 results)  Recent Labs  02/18/14 1609 02/22/14 0947 02/24/14 0332  PROBNP 1539.0* 2319.0* 2410.0*    CBG:  Recent Labs Lab 02/26/14 1709 02/26/14 2135 02/27/14 0737 02/27/14 1216 02/27/14 2124  GLUCAP  124* 232* 169* 166* 327*    Coagulation Studies:  Recent Labs  02/26/14 0302 02/27/14 0323 02/28/14 0415  LABPROT 25.1* 25.9* 21.7*  INR 2.25* 2.35* 1.87*    Other results: EKG: SR 77 bpm Imaging: Dg Abd 1 View  02/26/2014   CLINICAL DATA:  Lower abdominal pain and  constipation.  EXAM: ABDOMEN - 1 VIEW  COMPARISON:  None.  FINDINGS: No free intraperitoneal air is identified. The bowel gas pattern is nonobstructive. Large stool burden throughout the colon noted.  IMPRESSION: No acute finding.  Large colonic stool burden.   Electronically Signed   By: Drusilla Kanner M.D.   On: 02/26/2014 13:36   Dg Chest Port 1 View  02/27/2014   CLINICAL DATA:  Chronic respiratory failure with hypercapnia.  EXAM: PORTABLE CHEST - 1 VIEW  COMPARISON:  02/24/2014  FINDINGS: The heart size appears moderately enlarged. There is a left pleural effusion moderate pulmonary edema. This is unchanged from previous exam. Diminished lung volumes and pulmonary vascular congestion is unchanged.  IMPRESSION: 1. Stable left effusion and pulmonary vascular congestion.   Electronically Signed   By: Signa Kell M.D.   On: 02/27/2014 09:37      Medications:     Current Medications: . allopurinol  100 mg Oral Daily  . antiseptic oral rinse  7 mL Mouth Rinse q12n4p  . atorvastatin  10 mg Oral Daily  . bisacodyl  5 mg Oral Daily  . budesonide (PULMICORT) nebulizer solution  0.5 mg Nebulization BID  . chlorhexidine  15 mL Mouth Rinse BID  . diclofenac sodium  2 g Topical QID  . diltiazem  120 mg Oral Daily  . escitalopram  20 mg Oral Daily  . feeding supplement (GLUCERNA SHAKE)  237 mL Oral TID BM  . flecainide  100 mg Oral Q12H  . furosemide  80 mg Intravenous Q12H  . insulin aspart  0-5 Units Subcutaneous QHS  . insulin aspart  0-9 Units Subcutaneous TID WC  . levalbuterol  0.63 mg Nebulization QID  . methylPREDNISolone (SOLU-MEDROL) injection  40 mg Intravenous Q12H  . polyethylene glycol  17 g Oral BID  . senna-docusate  2 tablet Oral BID  . Warfarin - Pharmacist Dosing Inpatient   Does not apply q1800     Infusions:      Assessment/Plan    1. A/C Respiratory Failure - On chronic 2.5 liters [place] continuously prior to admit. Per pulmonary on solumedrol taper. BIPAP at  night.  Will need RHC today to further assess hemodynamics to help sort out dyspnea.  2. A/C Diastolic Heart Failure- ECHO EF 60-65% Grade I DD Peak PA pressure 74 mmhg  Volume status ok. BUN trending up. Stop IV lasix and  3. PAF - Maintaining NSR. on diltiazem and flecainide. On chronic coumadin. INR 1.8 Pharmacy dosing.  4. History of PE in 2008 - on chronic coumadin  5. OSA- on BiPap nightly per pulmonary  Length of Stay: 10  CLEGG,AMY NP-C  02/28/2014, 7:33 AM  Advanced Heart Failure Team Pager [phone] (M-F; 7a - 4p)  Please contact [facility] for night-coverage after hours (4p -7a ) and weekends on [email or web address]  Patient seen with NP, agree with the above note.  She has history of OHS/OSA, paroxysmal atrial fibrillation on flecainide, diastolic CHF, prior DVT/PE, CKD.  She was admitted with acute on chronic hypoxemic/hypercarbic respiratory failure.  She has been treated with antibiotics for ?PNA, Bipap for OHS/OSA, steroids (solumedrol) and diuretics for possible component of CHF.  She has diuresed reasonably well.  She remains on oxygen by facemask (on [place] at home).  1. Acute on chronic hypoxemic/hypercarbic respiratory failure: She has been treated with antibiotics, steroids, nebs, Bipap, and diuretics.  On exam currently, she does not appear significantly volume overloaded.  It may be that she developed a respiratory infection with decompensation of tenuous OHS/OSA.  2. CHF: Presumed acute on chronic diastolic CHF.  Echo showed severe pulmonary hypertension.  On exam, she does not appear particularly volume overloaded at this point.  She needs RHC to assess volume status.  Will hold Lasix for now with rise in BUN to 90s.  3. Pulmonary hypertension: Multiple possible reasons for this.  OHS/OSA likely plays a large role.  Also has history of PE so would question possible CTEPH.  Needs RHC to better define pulmonary pressure and left heart filling pressure. Would consider treatment with  Revatio in setting of PAH if PCWP not too high.  Will get V/Q scan to assess for chronic PE . 4. Atrial fibrillation: Has been on coumadin and flecainide.  I am concerned about flecainide use with presumed right heart failure and CKD.  I am going to stop this.  Will start her on low dose amiodarone to maintain NSR.  5. CKD: Creatinine not elevated but BUN very high.  As above, hold diuretics until we see RHC results.   Long-term, I think prognosis is fairly poor at this point.   Marca AnconaDalton McLean 02/28/2014 8:51 AM

## 2014-02-28 NOTE — Progress Notes (Signed)
Pt came back from cath lab at approximately 1715 post right heart cath with a dressing to right groin, no bleeding,soft to touch, strong pulses present bilaterally pedal and radial. Rechecked every 15 minutes x 3 and no bleeding soft to touch around site and bilateral pedal and radial pulses present. Pt's family at bedside. Pt had no pain stated. Pt ate snack before receiving supper. No complications encountered. Vitals signs within normal limits;see vital history.

## 2014-02-28 NOTE — Interval H&P Note (Signed)
History and Physical Interval Note:  02/28/2014 3:17 PM  [name] [name]  has presented today for surgery, with the diagnosis of cp  The various methods of treatment have been discussed with the patient and family. After consideration of risks, benefits and other options for treatment, the patient has consented to  Procedure(s): RIGHT HEART CATH (N/A) as a surgical intervention .  The patient's history has been reviewed, patient examined, no change in status, stable for surgery.  I have reviewed the patient's chart and labs.  Questions were answered to the patient's satisfaction.     Matheau Orona [employer]

## 2014-02-28 NOTE — Progress Notes (Signed)
PT Cancellation Note  Patient Details Name: Aneta Minseggy T Vokes MRN: [id number] DOB: 06/06/1931   Cancelled Treatment:    Reason Eval/Treat Not Completed: Patient at procedure or test/unavailable (Pt having IV placed by nursing and then MD speaking with pt/family. Pt for heart cath later today.)   Austyn Perriello 02/28/2014, 1:54 PM

## 2014-02-28 NOTE — Progress Notes (Signed)
Site area:RFV  Site Prior to Removal:  Level 0 Pressure Applied For:12 min Manual:    Patient Status During Pull:  stable Post Pull Site:  Level Post Pull Instructions Given:   Post Pull Pulses Present:  Dressing Applied:  bandaid Bedrest begins @ 1635 Comments: no complications

## 2014-02-28 NOTE — CV Procedure (Signed)
    Cardiac Catheterization Procedure Note  Name: [name] [name] MRN: [id number] DOB: 09/02/1931  Procedure: Right Heart Cath  Indication: Respiratory failure, ?CHF/pulmonary hypertension   Procedural Details: The right groin was prepped, draped, and anesthetized with 1% lidocaine. Using the modified Seldinger technique a 7 French sheath was placed in the right femoral vein. A Swan-Ganz catheter was used for the right heart catheterization. Standard protocol was followed for recording of right heart pressures and sampling of oxygen saturations. Fick and thermodilution cardiac output was calculated. There were no immediate procedural complications. The patient was transferred to the post catheterization recovery area for further monitoring.  Procedural Findings: Hemodynamics (mmHg) RA mean 3 RV 37/6 PA 37/15, mean 23 PCWP mean 7  Oxygen saturations: PA 75% AO 92%  Cardiac Output (Fick) 8.94  Cardiac Index (Fick) 4.14  Cardiac Output (Thermo) 5.05 Cardiac Index (Thermo) 2.34   Final Conclusions:  Normal left and right heart filling pressures.  Borderline pulmonary hypertension at rest, likely can be explained by OHS/OSA.  Cardiac output significantly decreased.    Recommendations: Would hold Lasix today, restart po Lasix perhaps tomorrow.  I suspect that her respiratory failure is primarily lung related at this point rather than due to CHF.   [name] 02/28/2014, 3:54 PM

## 2014-02-28 NOTE — Progress Notes (Signed)
Patient ZO:XWRUE:[name] [name]      DOB: 12/20/1931      AVW:[id number]   [facility] Progress [facility]    Subjective: Breathing feels stable but on ventimask.  Having loose BM's. No pain or dyspnea.  Hungry but NPO for RHC.     Filed Vitals:   02/28/14 1400  BP: 139/60  Pulse: 69  Temp:   Resp: 21   Physical exam: GEN: alert, NAD HEENT: [place], on venti mask, dry mm CV: regular rate Skin: warm/dry    CBC    Component Value Date/Time   WBC 16.4* 02/28/2014 0840   RBC 4.74 02/28/2014 0840   HGB 13.9 02/28/2014 0840   HCT 42.7 02/28/2014 0840   PLT 460* 02/28/2014 0840   MCV 90.1 02/28/2014 0840   MCH 29.3 02/28/2014 0840   MCHC 32.6 02/28/2014 0840   RDW 15.8* 02/28/2014 0840   LYMPHSABS 0.8 02/18/2014 1609   MONOABS 1.0 02/18/2014 1609   EOSABS 0.1 02/18/2014 1609   BASOSABS 0.0 02/18/2014 1609   CMP     Component Value Date/Time   NA 137 02/28/2014 0415   NA 135 12/28/2013 1457   K 4.0 02/28/2014 0415   CL 83* 02/28/2014 0415   CO2 43* 02/28/2014 0415   GLUCOSE 123* 02/28/2014 0415   GLUCOSE 91 12/28/2013 1457   BUN 98* 02/28/2014 0415   BUN 36* 12/28/2013 1457   CREATININE 1.17* 02/28/2014 0415   CREATININE 1.65* 09/17/2013 1646   CALCIUM 9.9 02/28/2014 0415   PROT 7.6 02/19/2014 0224   ALBUMIN 2.6* 02/19/2014 0224   AST 18 02/19/2014 0224   ALT 17 02/19/2014 0224   ALKPHOS 107 02/19/2014 0224   BILITOT 0.4 02/19/2014 0224   GFRNONAA 43* 02/28/2014 0415   GFRAA 50* 02/28/2014 0415      Assessment and plan: 80 yo female with PMHx of PE/DVT, OSA, Obesity, diastolic HF, Afib, CAD, chronic resp failure who presented with hypoxia and dyspnea.   1. Code Status: Full   2. Goals of Care:  See initial consult note.Spoke at length with [name] McalpinePhil and [name] [name] today.  They have talked with [name] HuaDavid (son who is Physician) and continue to have lots of frustrations with him.  This is expressed equally by [name] [name] and [name] McalpinePhil.  They feel like his telling them of her  difficult situation is not new information and that he has waited to long to take interest in her care.  [name] [name] clearly upset that she had not seen [name] Huaavid in over 1 year prior to this hospitalization and it sounds like this strained relationship has been ongoing for many years.   Multiple physicians have discussed goals and code status with them. They remain hopeful of improvement or at least exhausting attempts and finding treatable or reversible causes of her illness. We were able to discuss diagnostic work-up with RHC and V/Q scan with caveat that while this may give us more information, it will likely remain difficult to make strides in improving her respiratory failure.    3. Symptom Management:  1. Hypoxic/Hypercapnic Resp Failure with dyspnea- Ongoing, off opioids, dyspnea/pain not subjective issue for her. RHC today as well as VQ scan.   2. Constipation- now moving bowels. Will back off on bowel regimen.    4. Psychosocial/Spiritual: Close with Son [name] Mcalpinehil and his wife as well as daughter [name] [name]. Lives at home. She prefers we do not call son [name] HuaDavid who is a Development worker, communityphysician.   Total Time: 55 minutes  >50%  of time spent in counseling and coordination of care regarding above assessment and plan  Orvis BrillAaron J. Tenia Goh D.O. Palliative Medicine Team at [facility]  Pager: [phone] Team Phone: [phone]

## 2014-02-28 NOTE — Progress Notes (Signed)
ANTICOAGULATION CONSULT NOTE   Pharmacy Consult for warfarin Indication: history of PE/ atrial fibrillation  Allergies  Allergen Reactions  . Prednisone     Eyes swelling Pt is not sure if it was prednisone.     Patient Measurements: Height: 5\' 4"  (162.6 cm) Weight: 270 lb 11.6 oz (122.8 kg) IBW/kg (Calculated) : 54.7 Heparin Dosing Weight: 84.2 kg  Vital Signs: Temp: 96.4 F (35.8 C) (10/19 0736) Temp Source: Axillary (10/19 0736) BP: 115/52 mmHg (10/19 0400) Pulse Rate: 63 (10/19 0400)  Labs:  Recent Labs  02/26/14 0302 02/27/14 0323 02/28/14 0415  HGB 12.5 13.1  --   HCT 39.9 40.6  --   PLT 404* 421*  --   LABPROT 25.1* 25.9* 21.7*  INR 2.25* 2.35* 1.87*  CREATININE 1.20* 1.26* 1.17*    Estimated Creatinine Clearance: 49.6 ml/min (by C-G formula based on Cr of 1.17).   Medical History: Past Medical History  Diagnosis Date  . Congestive heart failure, unspecified     a. ECHO 6/0: EF 55-60%, mild LVH, mildly dilated RV w. mildly dec fx, RVSP 67  . Chronic atrial fibrillation     failed cardioversion in past  -repeat DC-CV on October 5th, 2010  . Personal history of DVT (deep vein thrombosis) 2008  . Obstructive sleep apnea   . HTN (hypertension)   . Morbid obesity   . Shingles     with postherpetic neuraigia  . GERD (gastroesophageal reflux disease)   . Hyperlipidemia   . Meningitis 1950s  . Gallstones     s/p cholecystectomy  . CAD (coronary artery disease)     nonobstructive by cath 8/10 - LAD 40-50% w mild PAH mean 29 w PVR 3.2 [place]  . AAA (abdominal aortic aneurysm)     small  . PE (pulmonary embolism)     history of  . Anemia     Medications:  PTA Warfarin 1.25 mg on Monday, 2.5 mg on all other days.   Assessment: [name] admitted w/ SOB. On warfarin PTA for atrial fibrillation and history of PE. INR 1.97 is at established goal  (goal INR 1.8-2.2 per outpatient coumadin clinic).  No bleeding complications noted. CBC stable  PTA dose:  1.25 mg on Mondays, 2.5 mg on all other days    Goal of Therapy:  Goal INR 1.8-2.2 Monitor platelets by anticoagulation protocol: Yes  Plan:  -Warfarin 2.5 mg po x1 -Daily INR -Monitor for s/sx bleeding  [name] SauersLisa Logen [name] Pharm.D. CPP, BCPS Clinical Pharmacist [phone] 02/28/2014 8:11 AM

## 2014-03-01 ENCOUNTER — Inpatient Hospital Stay (HOSPITAL_COMMUNITY): Payer: Medicare Other

## 2014-03-01 LAB — CBC
HCT: 41.4 % (ref 36.0–46.0)
Hemoglobin: 13.4 g/dL (ref 12.0–15.0)
MCH: 29.2 pg (ref 26.0–34.0)
MCHC: 32.4 g/dL (ref 30.0–36.0)
MCV: 90.2 fL (ref 78.0–100.0)
Platelets: 455 10*3/uL — ABNORMAL HIGH (ref 150–400)
RBC: 4.59 MIL/uL (ref 3.87–5.11)
RDW: 16.1 % — ABNORMAL HIGH (ref 11.5–15.5)
WBC: 16.7 10*3/uL — ABNORMAL HIGH (ref 4.0–10.5)

## 2014-03-01 LAB — GLUCOSE, CAPILLARY
Glucose-Capillary: 113 mg/dL — ABNORMAL HIGH (ref 70–99)
Glucose-Capillary: 142 mg/dL — ABNORMAL HIGH (ref 70–99)
Glucose-Capillary: 238 mg/dL — ABNORMAL HIGH (ref 70–99)
Glucose-Capillary: 152 mg/dL — ABNORMAL HIGH (ref 70–99)

## 2014-03-01 LAB — BASIC METABOLIC PANEL WITH GFR
Anion gap: 12 (ref 5–15)
BUN: 106 mg/dL — ABNORMAL HIGH (ref 6–23)
CO2: 41 meq/L (ref 19–32)
Calcium: 9.6 mg/dL (ref 8.4–10.5)
Chloride: 81 meq/L — ABNORMAL LOW (ref 96–112)
Creatinine, Ser: 1.18 mg/dL — ABNORMAL HIGH (ref 0.50–1.10)
GFR calc Af Amer: 49 mL/min — ABNORMAL LOW
GFR calc non Af Amer: 42 mL/min — ABNORMAL LOW
Glucose, Bld: 105 mg/dL — ABNORMAL HIGH (ref 70–99)
Potassium: 3.9 meq/L (ref 3.7–5.3)
Sodium: 134 meq/L — ABNORMAL LOW (ref 137–147)

## 2014-03-01 LAB — PROTIME-INR
INR: 1.45 (ref 0.00–1.49)
Prothrombin Time: 17.8 s — ABNORMAL HIGH (ref 11.6–15.2)

## 2014-03-01 MED ORDER — AMIODARONE HCL 200 MG PO TABS
200.0000 mg | ORAL_TABLET | Freq: Two times a day (BID) | ORAL | Status: DC
Start: 2014-03-01 — End: 2014-03-02
  Administered 2014-03-01 – 2014-03-02 (×3): 200 mg via ORAL
  Filled 2014-03-01 (×4): qty 1

## 2014-03-01 MED ORDER — TECHNETIUM TC 99M DIETHYLENETRIAME-PENTAACETIC ACID: 40.0000 | Freq: Once | INTRAVENOUS | Status: AC | PRN

## 2014-03-01 MED ORDER — TECHNETIUM TO 99M ALBUMIN AGGREGATED
6.0000 | Freq: Once | INTRAVENOUS | Status: AC | PRN
  Administered 2014-03-01: 6 via INTRAVENOUS

## 2014-03-01 MED ORDER — WARFARIN SODIUM 5 MG PO TABS
5.0000 mg | ORAL_TABLET | Freq: Once | ORAL | Status: AC
  Administered 2014-03-01: 5 mg via ORAL
  Filled 2014-03-01: qty 1

## 2014-03-01 NOTE — Progress Notes (Signed)
Patient ZO:XWRUE:TRUE [name] [name]      DOB: 09/11/1931      AVW:[id number]   [facility] Progress Note    Subjective: Feels okay today. Bowel movements slowed down.  Denies pain/dyspnea. Eating lunch. No nausea.     Filed Vitals:   03/01/14 1256  BP: 107/61  Pulse: 71  Temp: 97.7 F (36.5 C)  Resp: 19   Physical exam: GEN: alert, NAD, continues on venti mask HEENT: , sclera anicteric ABD: soft, ND  CBC    Component Value Date/Time   WBC 16.7* 03/01/2014 0311   RBC 4.59 03/01/2014 0311   HGB 13.4 03/01/2014 0311   HCT 41.4 03/01/2014 0311   PLT 455* 03/01/2014 0311   MCV 90.2 03/01/2014 0311   MCH 29.2 03/01/2014 0311   MCHC 32.4 03/01/2014 0311   RDW 16.1* 03/01/2014 0311   LYMPHSABS 0.8 02/18/2014 1609   MONOABS 1.0 02/18/2014 1609   EOSABS 0.1 02/18/2014 1609   BASOSABS 0.0 02/18/2014 1609    CMP     Component Value Date/Time   NA 134* 03/01/2014 0311   NA 135 12/28/2013 1457   K 3.9 03/01/2014 0311   CL 81* 03/01/2014 0311   CO2 41* 03/01/2014 0311   GLUCOSE 105* 03/01/2014 0311   GLUCOSE 91 12/28/2013 1457   BUN 106* 03/01/2014 0311   BUN 36* 12/28/2013 1457   CREATININE 1.18* 03/01/2014 0311   CREATININE 1.65* 09/17/2013 1646   CALCIUM 9.6 03/01/2014 0311   PROT 7.6 02/19/2014 0224   ALBUMIN 2.6* 02/19/2014 0224   AST 18 02/19/2014 0224   ALT 17 02/19/2014 0224   ALKPHOS 107 02/19/2014 0224   BILITOT 0.4 02/19/2014 0224   GFRNONAA 42* 03/01/2014 0311   GFRAA 49* 03/01/2014 0311      Assessment and plan: 80 yo female with PMHx of PE/DVT, OSA, Obesity, diastolic HF, Afib, CAD, chronic resp failure who presented with hypoxia and dyspnea.   1. Code Status: Full   2. Goals of Care:  See initial consult note as well as previous documentation.  Talked more with [name] [name] about issues going forward. Still on venti mask for O2 and bipap overnight.  RHC did not have markedly elevated PA pressures. Wedge 7.  Pulmonary following but seems  like this is more related to OHS/OSA, h/o PE.  Suspect long-term will be difficult to wean and future fraught with setbacks.  Tried talking a little more with [name] [name] about these issues today.  Ultimately, she is frustrated with suggestions that things may not go well and that she is not ready to go home.  Her challenging relationship with son [name] HuaDavid who is physician and suggested more comfort approach also adds another layer of complexity to this case.  Suspect her goals will evolve in future but she will likely need to see for herself how things go.   3. Symptom Management:  1. Hypoxic/Hypercapnic Resp Failure with dyspnea- Ongoing, off opioids, dyspnea/pain not subjective issue for her. RHC and VQ reviewed.  Possible LTAC going forward?  Concerning long-term issue.   2. Constipation- resolved. monitor  4. Psychosocial/Spiritual: Close with Ardelle ParkSon [name] [name] and his wife as well as daughter [name] [name]. Lives at home. She prefers we do not call son [name] HuaDavid who is a Development worker, communityphysician.    I will be off service tomorrow but my partner Dr Phillips OdorGolding will be aware of [name] [name].  Please consult our service in case of future admissions as well.   Total Time: 30 minutes   >  50% of time spent in counseling and coordination of care regarding above assessment and plan  Doran Clay D.O. Palliative Medicine Team at [facility]  Pager: [phone] Team Phone: [phone]

## 2014-03-01 NOTE — Progress Notes (Signed)
Advanced Heart Failure Rounding Note   Subjective:    [name] [name] is a 80 y.o. female with a history of chronic diastolic heart failure, obstructive sleep apnea on CPAP, history of DVT PE, chronic A. fib on coumadin, coronary artery disease, dyslipidemia, GERD, chronic lower extremity ulcers. INR has not been therapeutic since August.   Last admitted April 2015 with volume overload. She was diuresed 15 pounds. Discharge weight was 267 pounds.  She was evaluated by Dr Mariah MillingGollan in September. At that time she was euvolemic with weight 261 pounds. She continued on lasix 80 mg twice a day and metolazone as needed.   Prior to admit she had limited mobility with ongoing dyspnea with exertion. Requires assistance with ADLs. Has hospital bed and a day time care giver.   Admitted with increased dyspnea and cough thought to be from CAP. Prior to admit she had increased her home oxygen up to 4 liters from 2.5 liters and had an extra metolazone with ongoing dyspnea. Oxygen saturations remained in the low 80s an and she was instructed call 911. WBC on 15. Blood cultures obtained. Empiric antibiotics started. Pulmonary consulted due to increased dyspnea not improved with bipap. Oxygenation improved some what with IV lasix + metolazone as well as solumedrol (suspected pneumonitis). She has completed 7 days of Levaquin.    Yesterday she had RHC with normal filling pressures. Lasix was held after cath. Feeling ok this morning.   RA mean 3  RV 37/6  PA 37/15, mean 23  PCWP mean 7  Oxygen saturations:  PA 75%  AO 92%  Cardiac Output (Fick) 8.94  Cardiac Index (Fick) 4.14  Cardiac Output (Thermo) 5.05  Cardiac Index (Thermo) 2.34      Objective:   Weight Range:  Vital Signs:   Temp:  [96.4 F (35.8 C)-97.8 F (36.6 C)] 97.8 F (36.6 C) (10/20 0408) Pulse Rate:  [36-72] 61 (10/20 0600) Resp:  [15-27] 19 (10/20 0600) BP: (104-139)/(48-80) 104/48 mmHg (10/20 0600) SpO2:  [90 %-99 %] 96 % (10/20  0600) FiO2 (%):  [40 %] 40 % (10/20 0408) Weight:  [254 lb 3.1 oz (115.3 kg)-258 lb 4.8 oz (117.164 kg)] 258 lb 4.8 oz (117.164 kg) (10/20 0408) Last BM Date: 02/28/14  Weight change: Filed Weights   02/28/14 0400 02/28/14 1156 03/01/14 0408  Weight: 270 lb 11.6 oz (122.8 kg) 254 lb 3.1 oz (115.3 kg) 258 lb 4.8 oz (117.164 kg)    Intake/Output:   Intake/Output Summary (Last 24 hours) at 03/01/14 0646 Last data filed at 03/01/14 0400  Gross per 24 hour  Intake    103 ml  Output   2850 ml  Net  -2747 ml     Physical Exam: General:  Chronically ill appearing.  No resp difficulty. On BiPap  HEENT: normal Neck: supple. JVP difficult to assess due to body habitus.  . Carotids 2+ bilat; no bruits. No lymphadenopathy or thryomegaly appreciated. Cor: PMI nondisplaced. Regular rate & rhythm. No rubs, gallops or murmurs. Lungs: Decreased in based.  Abdomen: obese, soft, nontender, nondistended. No hepatosplenomegaly. No bruits or masses. Good bowel sounds. Extremities: no cyanosis, clubbing, rash, edema. R groin ok.  Neuro: alert & orientedx3, cranial nerves grossly intact. moves all 4 extremities w/o difficulty. Affect pleasant   Telemetry: SR 60s   Labs: Basic Metabolic Panel:  Recent Labs Lab 02/25/14 0319 02/26/14 0302 02/27/14 0323 02/28/14 0415 03/01/14 0311  NA 142 140 139 137 134*  K 3.9 3.7 4.1 4.0 3.9  CL 88* 85* 84* 83* 81*  CO2 43* 44* 45* 43* 41*  GLUCOSE 132* 115* 135* 123* 105*  BUN 83* 85* 95* 98* 106*  CREATININE 1.20* 1.20* 1.26* 1.17* 1.18*  CALCIUM 10.3 10.4 10.0 9.9 9.6    Liver Function Tests: No results found for this basename: AST, ALT, ALKPHOS, BILITOT, PROT, ALBUMIN,  in the last 168 hours No results found for this basename: LIPASE, AMYLASE,  in the last 168 hours No results found for this basename: AMMONIA,  in the last 168 hours  CBC:  Recent Labs Lab 02/25/14 0319 02/26/14 0302 02/27/14 0323 02/28/14 0840 03/01/14 0311  WBC 15.2*  17.1* 19.2* 16.4* 16.7*  HGB 11.9* 12.5 13.1 13.9 13.4  HCT 38.1 39.9 40.6 42.7 41.4  MCV 91.1 90.1 89.4 90.1 90.2  PLT 370 404* 421* 460* 455*    Cardiac Enzymes: No results found for this basename: CKTOTAL, CKMB, CKMBINDEX, TROPONINI,  in the last 168 hours  BNP: BNP (last 3 results)  Recent Labs  02/18/14 1609 02/22/14 0947 02/24/14 0332  PROBNP 1539.0* 2319.0* 2410.0*     Other results:  EKG:   Imaging: Dg Chest Port 1 View  02/27/2014   CLINICAL DATA:  Chronic respiratory failure with hypercapnia.  EXAM: PORTABLE CHEST - 1 VIEW  COMPARISON:  02/24/2014  FINDINGS: The heart size appears moderately enlarged. There is a left pleural effusion moderate pulmonary edema. This is unchanged from previous exam. Diminished lung volumes and pulmonary vascular congestion is unchanged.  IMPRESSION: 1. Stable left effusion and pulmonary vascular congestion.   Electronically Signed   By: Signa Kell M.D.   On: 02/27/2014 09:37      Medications:     Scheduled Medications: . allopurinol  100 mg Oral Daily  . antiseptic oral rinse  7 mL Mouth Rinse q12n4p  . atorvastatin  10 mg Oral Daily  . budesonide (PULMICORT) nebulizer solution  0.5 mg Nebulization BID  . chlorhexidine  15 mL Mouth Rinse BID  . diclofenac sodium  2 g Topical QID  . diltiazem  120 mg Oral Daily  . escitalopram  20 mg Oral Daily  . feeding supplement (GLUCERNA SHAKE)  237 mL Oral TID BM  . insulin aspart  0-5 Units Subcutaneous QHS  . insulin aspart  0-9 Units Subcutaneous TID WC  . levalbuterol  0.63 mg Nebulization QID  . methylPREDNISolone (SOLU-MEDROL) injection  40 mg Intravenous Daily  . senna-docusate  1 tablet Oral BID  . sodium chloride  3 mL Intravenous Q12H  . Warfarin - Pharmacist Dosing Inpatient   Does not apply q1800     Infusions:     PRN Medications:  sodium chloride, acetaminophen, bisacodyl, ondansetron (ZOFRAN) IV, polyethylene glycol, sodium chloride   Assessment/Plan     She has history of OHS/OSA, paroxysmal atrial fibrillation on flecainide, diastolic CHF, prior DVT/PE, CKD. She was admitted with acute on chronic hypoxemic/hypercarbic respiratory failure. She has been treated with antibiotics for ?PNA, Bipap for OHS/OSA, steroids (solumedrol) and diuretics for possible component of CHF. She has diuresed reasonably well. She remains on oxygen by facemask (on [place] at home).  1. Acute on chronic hypoxemic/hypercarbic respiratory failure: She has been treated with antibiotics, steroids, nebs, Bipap, and diuretics.  Had RHC with normal filling pressures .  2. CHF: Presumed acute on chronic diastolic CHF. Echo showed severe pulmonary hypertension.  Had RHC with normal filling pressures. Diuretics on hold.  3. Pulmonary hypertension: Multiple possible reasons for this. OHS/OSA likely plays a large  role. Also has history of PE so would question possible CTEPH.  V/Q pending to look for chronic PE .  4. Atrial fibrillation: Has been on coumadin and flecainide. Yesterday flecainide stopped with presumed right heart failure and CKD. Amiodarone 200 mg daily started. INR 1.45 Pharmacy to dose coumadin.  5. CKD: Creatinine not elevated but BUN trending up. Continue to hold diuretics.   6.Deconditioning--needs to get OOB. Consult PT.   Length of Stay: 11   CLEGG,AMY NP-C  03/01/2014, 6:46 AM  Advanced Heart Failure Team Pager [phone] (M-F; 7a - 4p)  Please contact [facility] for night-coverage after hours (4p -7a ) and weekends on [email or web address]  Patient seen with NP, agree with the above note.  Respiratory failure appears predominantly pulmonary-related with normal right and left heart filling pressures on RHC and only borderline pulmonary HTN.  The borderline pulmonary hypertension is likely explanable by hypoxemia.  This is surprisingly better than the estimated PA pressure by echo.  She does not look volume overloaded on exam either and BUN is very high.  Continue to  hold Lasix.   She has PAF.  I am concerned about flecainide use with renal failure and history of right heart failure.  She would do poorly were she to go back into atrial fibrillation.  I have put her on amiodarone.  Would continue 200 bid x 1 week then 200 daily x 1 week then will decrease dose down to 100 daily (use lowest possible dose with abnormal baseline lung function).   Marca AnconaDalton Erek Kowal 03/01/2014

## 2014-03-01 NOTE — Progress Notes (Signed)
[facility] PROGRESS NOTE  [name] [name] TSV:[id number] DOB: Sep 21, 1931 DOA: 02/18/2014 PCP: Eliezer Lofts, MD Brief narrative 80 y.o. female with Past medical history of chronic diastolic heart failure, obstructive sleep apnea on CPAP, on chr home o2 ( 2.5L Guy ) history of DVT& PE in 2008, chronic A. fib on coumadin , coronary artery disease, dyslipidemia, GERD, chronic lower extremity ulcers presenting with complaints of cough and shortness of breath along with hypoxia. Patient progressively short of breath for past 3-4 weeks.  Patient admitted to SDU with acute on chr hypoxic resp failure. She has received treatment for PNA with Levaquin for 7 days, IV steroids for possible Pneumonitis, IV lasix and metolazone for Heart Failure. She was found to have severe pulmonary HTN. She underwent right side Cath 10-19 which showed Normal left and right heart filling pressures. Borderline pulmonary hypertension at rest, likely can be explained by OHS/OSA. Cardiac output significantly decreased. Dr Aundra Dubin suspect that respiratory failure is primary lung related. V-Q scan pending to rule out chronic PE.    Assessment/Plan: Acute on chr hypoxic resp failure;   -Secondary to CAP vs volume overload vs? Pneumonitis, pulmonary HTN., Heart Failure. . possble b/l infiltrate on admission CXR. -Blood cx, urine strep and legionella all negative.  - Scheduled xopenex neb and antitussives. Added Pulmicort neb twice a day. -Patient had worsening  short of breath with followup x-ray showing increased pulmonary edema on 10-12. Elevated proBNP noted. -ESR also elevated.  -ECHO with severe pulmonary Hypertension.  -appreciate pulmonary evaluation and recommendations.   -Holding  lasix 80 mg BID in setting of uremia and pre Cath. Will defer to cardio to resume lasix.  -Completed Levaquin for 7 days on 9-15.  -Solumedrol 80 Mg BID on 1-14. Change to 40 mg BID 10-17. Solumedrol change to 40 mg daily 10-20. -using BIPAP at  night only. On Non-rebreder.  -Appreciate cardiology evaluation. Patient S/P right side Heart cath. V-Q scan to evaluate for chronic PE pending.   Constipation; dulcolax suppository, miralax. , patient was disimpacted for large amt of soft brown stool. XY negative for obstruction. Resolved.   Leukocytosis; on steroids, afebrile. X ray stable. Finished Levaquin. Check UA. WBC stable.   Encephalopathy: resolved.  Multifactorial, opioids, Vs hypercapnia.   Acute on Chronic diastolic heart failure exacerbation.  Continue with zaroxyline. Monitor I/O and daily wts Holding V lasix 80 mg BID and Metolazone, due to increasing BUN.  Chest x ray 10-15 evidence of heart failure, left lobe consolidation.  Negative 13 L.  Cardio following.   History of A. fib and PE PE in 2008. Currently in sinus. coumadin dose per pharmacy continue Cardizem and flecainide INR 1.4. If VQ scan positive , patient will need bridge.   Acute gouty arthritis; resolved.  continue with topical Voltaren gel. Holding  home dose Oxycodone to avoid oversedation. Resolved.   CKD stage III -Cr per prior records at 1.4 to 1.7. -Holding  IV lasix dose -Cr stable, trending down. Today at 1.2 -BUN increasing.   Dyslipidemia  cont statin   Code Status: FULL CODE Family Communication: Care discussed with patient, and with son [name]  on 10-19.  Disposition Plan: remain in the step down.    Consultants:  CCM.   Cardiology.   Procedures:  ECHO  Antibiotics:  Levaquin ( 10/9--)  HPI/Subjective: No complaints. She feels she is improving.  Objective: Filed Vitals:   03/01/14 0600  BP: 104/48  Pulse: 61  Temp:   Resp: 19  Intake/Output Summary (Last 24 hours) at 03/01/14 0810 Last data filed at 03/01/14 0400  Gross per 24 hour  Intake      3 ml  Output   2850 ml  Net  -2847 ml   Filed Weights   02/28/14 0400 02/28/14 1156 03/01/14 0408  Weight: 122.8 kg (270 lb 11.6 oz) 115.3 kg (254 lb 3.1 oz)  117.164 kg (258 lb 4.8 oz)    Exam:   General:  Elderly obese female no distress.   HEENT:  moist oral mucosa  chest: bilateral crackles.   CVS: NS1&S2, no murmurs  Abd: soft, NT, ND, BS+  Ext: trace edema.   CNS: Alert following command.    Data Reviewed: Basic Metabolic Panel:  Recent Labs Lab 02/25/14 0319 02/26/14 0302 02/27/14 0323 02/28/14 0415 03/01/14 0311  NA 142 140 139 137 134*  K 3.9 3.7 4.1 4.0 3.9  CL 88* 85* 84* 83* 81*  CO2 43* 44* 45* 43* 41*  GLUCOSE 132* 115* 135* 123* 105*  BUN 83* 85* 95* 98* 106*  CREATININE 1.20* 1.20* 1.26* 1.17* 1.18*  CALCIUM 10.3 10.4 10.0 9.9 9.6   Liver Function Tests: No results found for this basename: AST, ALT, ALKPHOS, BILITOT, PROT, ALBUMIN,  in the last 168 hours No results found for this basename: LIPASE, AMYLASE,  in the last 168 hours No results found for this basename: AMMONIA,  in the last 168 hours CBC:  Recent Labs Lab 02/25/14 0319 02/26/14 0302 02/27/14 0323 02/28/14 0840 03/01/14 0311  WBC 15.2* 17.1* 19.2* 16.4* 16.7*  HGB 11.9* 12.5 13.1 13.9 13.4  HCT 38.1 39.9 40.6 42.7 41.4  MCV 91.1 90.1 89.4 90.1 90.2  PLT 370 404* 421* 460* 455*   Cardiac Enzymes: No results found for this basename: CKTOTAL, CKMB, CKMBINDEX, TROPONINI,  in the last 168 hours BNP (last 3 results)  Recent Labs  02/18/14 1609 02/22/14 0947 02/24/14 0332  PROBNP 1539.0* 2319.0* 2410.0*   CBG:  Recent Labs Lab 02/27/14 1216 02/27/14 2124 02/28/14 0738 02/28/14 1220 02/28/14 2114  GLUCAP 166* 327* 156* 104* 197*    Recent Results (from the past 240 hour(s))  CULTURE, EXPECTORATED SPUTUM-ASSESSMENT     Status: None   Collection Time    02/24/14  9:20 PM      Result Value Ref Range Status   Specimen Description SPUTUM   Final   Special Requests NONE   Final   Sputum evaluation     Final   Value: THIS SPECIMEN IS ACCEPTABLE. RESPIRATORY CULTURE REPORT TO FOLLOW.   Report Status 02/24/2014 FINAL    Final  CULTURE, RESPIRATORY (NON-EXPECTORATED)     Status: None   Collection Time    02/24/14  9:20 PM      Result Value Ref Range Status   Specimen Description SPUTUM   Final   Special Requests NONE   Final   Gram Stain     Final   Value: RARE WBC PRESENT, PREDOMINANTLY MONONUCLEAR     RARE SQUAMOUS EPITHELIAL CELLS PRESENT     MODERATE GRAM POSITIVE COCCI     IN PAIRS IN CLUSTERS     Performed at [facility]   Culture     Final   Value: NORMAL OROPHARYNGEAL FLORA     Performed at [facility]   Report Status 02/27/2014 FINAL   Final     Studies: Dg Chest Port 1 View  03/01/2014   CLINICAL DATA:  Respiratory failure  EXAM: PORTABLE CHEST -  1 VIEW  COMPARISON:  02/27/2014  FINDINGS: Cardiomegaly with pulmonary vascular congestion and suspected mild interstitial edema, most prominent in the left perihilar region. Possible small left pleural effusion. No pneumothorax  IMPRESSION: Cardiomegaly with mild interstitial edema and possible small left pleural effusion.   Electronically Signed   By: Julian Hy M.D.   On: 03/01/2014 07:58    Scheduled Meds: . allopurinol  100 mg Oral Daily  . antiseptic oral rinse  7 mL Mouth Rinse q12n4p  . atorvastatin  10 mg Oral Daily  . budesonide (PULMICORT) nebulizer solution  0.5 mg Nebulization BID  . chlorhexidine  15 mL Mouth Rinse BID  . diclofenac sodium  2 g Topical QID  . diltiazem  120 mg Oral Daily  . escitalopram  20 mg Oral Daily  . feeding supplement (GLUCERNA SHAKE)  237 mL Oral TID BM  . insulin aspart  0-5 Units Subcutaneous QHS  . insulin aspart  0-9 Units Subcutaneous TID WC  . levalbuterol  0.63 mg Nebulization QID  . methylPREDNISolone (SOLU-MEDROL) injection  40 mg Intravenous Daily  . senna-docusate  1 tablet Oral BID  . sodium chloride  3 mL Intravenous Q12H  . Warfarin - Pharmacist Dosing Inpatient   Does not apply q1800   Continuous Infusions:      Time spent: 25 minutes    Niel Hummer A  [facility] Pager [phone] If 7PM-7AM, please contact night-coverage at [email or web address], password [facility] 03/01/2014, 8:10 AM  LOS: 11 days

## 2014-03-01 NOTE — Progress Notes (Signed)
PULMONARY / CRITICAL CARE MEDICINE   Name: [name] [name] MRN: [id number] DOB: 12/09/1931    ADMISSION DATE:  02/18/2014 CONSULTATION DATE:  10/11/5   REFERRING MD :  [facility]  CHIEF COMPLAINT:  sob  INITIAL PRESENTATION:  82 yowf obese, never smoker, last seen by Dr [name] 01/19/13 with dx CHF, obesity, prior pulmonary emboli and pulmonary hypertension related to obstructive sleep apnea admit by [facility] 10/9 with one week of worsening resting and noct sob with dx of OHS/ ? CAP but not better on bipap so pccm svc asked to see am 10/11.    PMH - DVT on the left and PE in 2007, diastolic HF on chronic diuretics She uses CPAP at 7- 10 cm of water every night. At baseline she is on 2.5 L of oxygen continuously, 4L on exertion.  Studies:  10/19 RHC >RA mean 3 RV 37/6 PA 37/15, mean 23 PCWP mean 7 Oxygen saturations: PA 75% AO 92% Cardiac Output(Fick) 8.94 Cardiac Index (Fick) 4.14 Cardiac Output (Thermo) 5.05 Cardiac Index (Thermo) 2.34     SUBJECTIVE:  Feels a little better, has been walking  VITAL SIGNS: Temp:  [97.5 F (36.4 C)-97.8 F (36.6 C)] 97.5 F (36.4 C) (10/20 84130822) Pulse Rate:  [36-72] 60 (10/20 0822) Resp:  [16-27] 18 (10/20 0822) BP: (104-139)/(48-80) 124/50 mmHg (10/20 0927) SpO2:  [91 %-99 %] 95 % (10/20 0835) FiO2 (%):  [40 %] 40 % (10/20 0835) Weight:  [117.164 kg (258 lb 4.8 oz)] 117.164 kg (258 lb 4.8 oz) (10/20 0408)  VENTILATOR SETTINGS: Vent Mode:  [-]  FiO2 (%):  [40 %] 40 %  INTAKE / OUTPUT:  Intake/Output Summary (Last 24 hours) at 03/01/14 1235 Last data filed at 03/01/14 0824  Gross per 24 hour  Intake      3 ml  Output   1550 ml  Net  -1547 ml    PHYSICAL EXAMINATION:  Gen: no distress HEENT: NCAT, venturi PULM: Crackles in bases CV: RRR, no mgr Ab: soft, nontender Ext: warm, some edema Neuro: A&Ox4, maew  LABS:  Recent Labs Lab 02/27/14 0323 02/28/14 0840 03/01/14 0311  HGB 13.1 13.9 13.4  HCT 40.6 42.7 41.4  WBC 19.2* 16.4* 16.7*   PLT 421* 460* 455*    Recent Labs Lab 02/25/14 0319 02/26/14 0302 02/27/14 0323 02/28/14 0415 03/01/14 0311  NA 142 140 139 137 134*  K 3.9 3.7 4.1 4.0 3.9  CL 88* 85* 84* 83* 81*  CO2 43* 44* 45* 43* 41*  GLUCOSE 132* 115* 135* 123* 105*  BUN 83* 85* 95* 98* 106*  CREATININE 1.20* 1.20* 1.26* 1.17* 1.18*  CALCIUM 10.3 10.4 10.0 9.9 9.6    CXR: 10/18: low lung volumes, edema  KUB: 10/17 Large stool burden  ASSESSMENT: Acute on chronic hypoxic resp failure - ddx pneumonitis/fibrosis or resolving CAP (viral?)  10/20 RHC > no pulmonary hypertension, normal wedge OHS, possible OSA Diastolic heart failure, appears near euvolemia with normal wedge History of Pulmonary embolism, doubt playing a role now as anticoagulated At this point seems to be slowly improving, cause of hypoxemic respiratory failure is pulmonary   PLAN:  CT high resolution of lungs to evaluate pulmonary parenchymal disease Hold off on ILD serologies> won't be helpful with steroids Continue solumedrol for now Lasix per cardiology Continue qHS BIPAP   Heber CarolinaBrent Axell Trigueros, MD [place] [facility] Pager: [phone] Cell: [phone] If no response, call [phone]

## 2014-03-01 NOTE — Progress Notes (Signed)
ANTICOAGULATION CONSULT NOTE - Follow Up Consult  Pharmacy Consult for Coumadin Indication: history of PE/atrial fibrillatino  Allergies  Allergen Reactions  . Prednisone     Eyes swelling Pt is not sure if it was prednisone.     Patient Measurements: Height: 5\' 4"  (162.6 cm) Weight: 258 lb 4.8 oz (117.164 kg) IBW/kg (Calculated) : 54.7  Vital Signs: Temp: 97.5 F (36.4 C) (10/20 0822) Temp Source: Oral (10/20 0822) BP: 115/55 mmHg (10/20 0822) Pulse Rate: 60 (10/20 0822)  Labs:  Recent Labs  02/27/14 0323 02/28/14 0415 02/28/14 0840 03/01/14 0311  HGB 13.1  --  13.9 13.4  HCT 40.6  --  42.7 41.4  PLT 421*  --  460* 455*  LABPROT 25.9* 21.7*  --  17.8*  INR 2.35* 1.87*  --  1.45  CREATININE 1.26* 1.17*  --  1.18*    Estimated Creatinine Clearance: 47.8 ml/min (by C-G formula based on Cr of 1.18).  Assessment: 82yof continues on coumadin for hx PE (2008) and afib. INR is below goal today at 1.45. VQ scan pending to rule out chronic PE - if positive will likely need to be bridged. Levaquin course completed (10/9-10/15).   PTA dose: 1.25 mg on Mondays, 2.5 mg on all other days   Goal of Therapy:  INR 1.8-2.2 (per outpatient coumadin clinic) Monitor platelets by anticoagulation protocol: Yes   Plan:  1) Increase coumadin to 5mg  x 1 2) INR in AM 3) Follow up VQ scan  Fredrik RiggerMarkle, Krystianna Soth Sue 03/01/2014,9:00 AM

## 2014-03-01 NOTE — Care Management Note (Signed)
    Page 1 of 1   03/01/2014     2:40:11 PM CARE MANAGEMENT NOTE 03/01/2014  Patient:  [name] [name],[name] [name]   Account Number:  [id number]  Date Initiated:  02/21/2014  Documentation initiated by:  Kareema Keitt  Subjective/Objective Assessment:   dx PNA; lives with son; [facility], has home O2 and CPAP     DC Planning Services  CM consult      Status of service:  In process, will continue to follow Medicare Important Message given?  YES (If response is "NO", the following Medicare IM given date fields will be blank) Date Medicare IM given:  02/28/2014 Medicare IM given by:  Jeanett Antonopoulos Date Additional Medicare IM given:  02/24/2014 Additional Medicare IM given by:  Verdis PrimeHENRIETTA Shabnam Ladd  Per UR Regulation:  Reviewed for med. necessity/level of care/duration of stay  If discussed at Long Length of Stay Meetings, dates discussed:   02/24/2014  03/01/2014    Comments:  03/01/14 1430 Hajira Verhagen RN MSN BSN CCM Pt and son, Michele Mcalpinehil [phone]), [facility]. [place], son encouraged to tour both.  02/28/14 1130 Dawayne Ohair RN MSN BSN CCM Pt appropriate for LTAC, (R) heart cath scheduled for this afternoon.  02/28/14 [id number] Vedha Tercero RN MSN BSN CCM S/P PC mtg - family continues to request aggressive care. Requested LTAC eval.  02/23/14 1530 Breeze Berringer RN MSN BSN CCM PCCM consult 2/2 increasing O2 requirements > BiPAP/steroids, recommend palliative care consult.

## 2014-03-01 NOTE — Progress Notes (Signed)
Physical Therapy Treatment Patient Details Name: [name] [name] MRN: [id number] DOB: 05/15/1931 Today's Date: 03/01/2014    History of Present Illness pt presents with Chronic Hypercarbic Resp Failure and CAP.      PT Comments    Pt making good progress. Feel pt can benefit from LTAC.  Follow Up Recommendations  LTACH     Equipment Recommendations  None recommended by PT    Recommendations for Other Services       Precautions / Restrictions Precautions Precautions: Fall Precaution Comments: Pt on venti mask    Mobility  Bed Mobility   Bed Mobility: Supine to Sit     Supine to sit: Mod assist;HOB elevated     General bed mobility comments: A with bringing trunk up to sitting and scooting hips to EOB.    Transfers Overall transfer level: Needs assistance Equipment used: Rolling walker (2 wheeled) Transfers: Sit to/from Stand Sit to Stand: +2 physical assistance;Min assist         General transfer comment: Verbal cues for hand placement and assist to bring hips up.  Ambulation/Gait Ambulation/Gait assistance: Min assist;+2 safety/equipment Ambulation Distance (Feet): 30 Feet (30' x 1, 8' x 1) Assistive device: Rolling walker (2 wheeled) Gait Pattern/deviations: Step-through pattern;Decreased step length - right;Decreased step length - left;Shuffle;Trunk flexed Gait velocity: decr Gait velocity interpretation: Below normal speed for age/gender General Gait Details: Verbal cues to stand more erect and look up.   Stairs            Wheelchair Mobility    Modified Rankin (Stroke Patients Only)       Balance Overall balance assessment: Needs assistance Sitting-balance support: No upper extremity supported;Feet supported Sitting balance-Leahy Scale: Fair Sitting balance - Comments: Verbal cues to extend trunk and neck.   Standing balance support: Bilateral upper extremity supported Standing balance-Leahy Scale: Poor Standing balance comment:  walker and min A for static standing.                    Cognition Arousal/Alertness: Awake/alert Behavior During Therapy: WFL for tasks assessed/performed Overall Cognitive Status: Within Functional Limits for tasks assessed                      Exercises      General Comments        Pertinent Vitals/Pain Pain Assessment: No/denies pain    Home Living                      Prior Function            PT Goals (current goals can now be found in the care plan section) Progress towards PT goals: Progressing toward goals    Frequency  Min 3X/week    PT Plan Discharge plan needs to be updated    Co-evaluation             End of Session Equipment Utilized During Treatment: Gait belt;Oxygen Activity Tolerance: Patient tolerated treatment well Patient left: in chair;with call bell/phone within reach;with nursing/sitter in room     Time: 0952-1012 PT Time Calculation (min): 20 min  Charges:  $Gait Training: 8-22 mins                    G Codes:      [name] [name] 03/01/2014, 11:30 AM  [facility] Tan[facility] [name] PT [phone]

## 2014-03-02 ENCOUNTER — Inpatient Hospital Stay
Admission: AD | Admit: 2014-03-02 | Discharge: 2014-03-21 | Disposition: A | Discharge status: Home Health | Payer: Self-pay | Source: Ambulatory Visit | Attending: Family Medicine | Admitting: Family Medicine

## 2014-03-02 ENCOUNTER — Other Ambulatory Visit (HOSPITAL_COMMUNITY): Payer: Self-pay

## 2014-03-02 DIAGNOSIS — J9690 Respiratory failure, unspecified, unspecified whether with hypoxia or hypercapnia: Secondary | ICD-10-CM

## 2014-03-02 DIAGNOSIS — J90 Pleural effusion, not elsewhere classified: Secondary | ICD-10-CM

## 2014-03-02 LAB — BASIC METABOLIC PANEL
ANION GAP: 11 (ref 5–15)
BUN: 97 mg/dL — ABNORMAL HIGH (ref 6–23)
CO2: 41 mEq/L (ref 19–32)
Calcium: 9.3 mg/dL (ref 8.4–10.5)
Chloride: 86 mEq/L — ABNORMAL LOW (ref 96–112)
Creatinine, Ser: 1.06 mg/dL (ref 0.50–1.10)
GFR calc non Af Amer: 48 mL/min — ABNORMAL LOW (ref >90)
GFR, EST AFRICAN AMERICAN: 56 mL/min — AB (ref >90)
Glucose, Bld: 116 mg/dL — ABNORMAL HIGH (ref 70–99)
POTASSIUM: 3.5 meq/L — AB (ref 3.7–5.3)
Sodium: 138 mEq/L (ref 137–147)

## 2014-03-02 LAB — URINALYSIS, ROUTINE W REFLEX MICROSCOPIC
BILIRUBIN URINE: NEGATIVE
Glucose, UA: NEGATIVE mg/dL
Hgb urine dipstick: NEGATIVE
Ketones, ur: NEGATIVE mg/dL
Nitrite: NEGATIVE
PH: 7.5 (ref 5.0–8.0)
Protein, ur: NEGATIVE mg/dL
Specific Gravity, Urine: 1.019 (ref 1.005–1.030)
Urobilinogen, UA: 0.2 mg/dL (ref 0.0–1.0)

## 2014-03-02 LAB — URINE MICROSCOPIC-ADD ON

## 2014-03-02 LAB — CBC
HCT: 40 % (ref 36.0–46.0)
Hemoglobin: 12.7 g/dL (ref 12.0–15.0)
MCH: 28.7 pg (ref 26.0–34.0)
MCHC: 31.8 g/dL (ref 30.0–36.0)
MCV: 90.3 fL (ref 78.0–100.0)
PLATELETS: 404 10*3/uL — AB (ref 150–400)
RBC: 4.43 MIL/uL (ref 3.87–5.11)
RDW: 16.1 % — ABNORMAL HIGH (ref 11.5–15.5)
WBC: 17.9 10*3/uL — AB (ref 4.0–10.5)

## 2014-03-02 LAB — PROTIME-INR
INR: 1.37 (ref 0.00–1.49)
PROTHROMBIN TIME: 17 s — AB (ref 11.6–15.2)

## 2014-03-02 LAB — GLUCOSE, CAPILLARY
GLUCOSE-CAPILLARY: 93 mg/dL (ref 70–99)
Glucose-Capillary: 125 mg/dL — ABNORMAL HIGH (ref 70–99)

## 2014-03-02 MED ORDER — POTASSIUM CHLORIDE CRYS ER 20 MEQ PO TBCR
40.0000 meq | EXTENDED_RELEASE_TABLET | Freq: Once | ORAL | Status: AC
  Administered 2014-03-02: 40 meq via ORAL
  Filled 2014-03-02: qty 2

## 2014-03-02 MED ORDER — SENNOSIDES-DOCUSATE SODIUM 8.6-50 MG PO TABS: 1.0000 | ORAL_TABLET | Freq: Two times a day (BID) | ORAL | Status: DC

## 2014-03-02 MED ORDER — ONDANSETRON HCL 4 MG/2ML IJ SOLN: 4.0000 mg | Freq: Four times a day (QID) | INTRAMUSCULAR | Status: DC | PRN

## 2014-03-02 MED ORDER — SODIUM CHLORIDE 0.9 % IV SOLN: 250.0000 mL | INTRAVENOUS | Status: DC

## 2014-03-02 MED ORDER — AMIODARONE HCL 200 MG PO TABS: 200.0000 mg | ORAL_TABLET | Freq: Two times a day (BID) | ORAL | Status: DC

## 2014-03-02 MED ORDER — CHLORHEXIDINE GLUCONATE 0.12 % MT SOLN: 15.0000 mL | Freq: Two times a day (BID) | OROMUCOSAL | Status: DC

## 2014-03-02 MED ORDER — INSULIN ASPART 100 UNIT/ML ~~LOC~~ SOLN: SUBCUTANEOUS | Status: DC

## 2014-03-02 MED ORDER — POLYETHYLENE GLYCOL 3350 17 G PO PACK: 17.0000 g | PACK | Freq: Every day | ORAL | Status: DC | PRN

## 2014-03-02 MED ORDER — LEVALBUTEROL HCL 0.63 MG/3ML IN NEBU: 0.6300 mg | INHALATION_SOLUTION | Freq: Four times a day (QID) | RESPIRATORY_TRACT | Status: DC

## 2014-03-02 MED ORDER — GLUCERNA SHAKE PO LIQD: 237.0000 mL | Freq: Three times a day (TID) | ORAL | Status: DC

## 2014-03-02 MED ORDER — CETYLPYRIDINIUM CHLORIDE 0.05 % MT LIQD: 7.0000 mL | Freq: Two times a day (BID) | OROMUCOSAL | Status: DC

## 2014-03-02 MED ORDER — ACETAMINOPHEN 325 MG PO TABS: 650.0000 mg | ORAL_TABLET | ORAL | Status: DC | PRN

## 2014-03-02 MED ORDER — METHYLPREDNISOLONE SODIUM SUCC 40 MG IJ SOLR: 40.0000 mg | Freq: Every day | INTRAMUSCULAR | Status: DC

## 2014-03-02 MED ORDER — BUDESONIDE 0.5 MG/2ML IN SUSP: 0.5000 mg | Freq: Two times a day (BID) | RESPIRATORY_TRACT | Status: DC

## 2014-03-02 MED ORDER — DICLOFENAC SODIUM 1 % TD GEL: 2.0000 g | Freq: Four times a day (QID) | TRANSDERMAL | Status: DC

## 2014-03-02 NOTE — Progress Notes (Signed)
Advanced Heart Failure Rounding Note   Subjective:    [name] [name] is a 80 y.o. female with a history of chronic diastolic heart failure, obstructive sleep apnea on CPAP, history of DVT PE, chronic A. fib on coumadin, coronary artery disease, dyslipidemia, GERD, chronic lower extremity ulcers. INR has not been therapeutic since August.   Last admitted April 2015 with volume overload. She was diuresed 15 pounds. Discharge weight was 267 pounds.  She was evaluated by Dr Mariah Milling in September. At that time she was euvolemic with weight 261 pounds. She continued on lasix 80 mg twice a day and metolazone as needed.   Prior to admit she had limited mobility with ongoing dyspnea with exertion. Requires assistance with ADLs. Has hospital bed and a day time care giver.   Admitted with increased dyspnea and cough thought to be from CAP. Prior to admit she had increased her home oxygen up to 4 liters from 2.5 liters and had an extra metolazone with ongoing dyspnea. Oxygen saturations remained in the low 80s an and she was instructed call 911. WBC on 15. Blood cultures obtained. Empiric antibiotics started. Pulmonary consulted due to increased dyspnea not improved with bipap. Oxygenation improved some what with IV lasix + metolazone as well as solumedrol (suspected pneumonitis). She has completed 7 days of Levaquin.    VQ low probability for PE.  Remains off diuretics. Weight down 3 pounds.   Feeling ok this morning.   RA mean 3  RV 37/6  PA 37/15, mean 23  PCWP mean 7  Oxygen saturations:  PA 75%  AO 92%  Cardiac Output (Fick) 8.94  Cardiac Index (Fick) 4.14  Cardiac Output (Thermo) 5.05  Cardiac Index (Thermo) 2.34      Objective:   Weight Range:  Vital Signs:   Temp:  [97.5 F (36.4 C)-97.7 F (36.5 C)] 97.7 F (36.5 C) (10/21 0253) Pulse Rate:  [59-73] 59 (10/21 0350) Resp:  [18-28] 22 (10/21 0350) BP: (91-135)/(43-70) 108/50 mmHg (10/21 0350) SpO2:  [92 %-96 %] 96 % (10/21  0000) FiO2 (%):  [40 %] 40 % (10/21 0350) Weight:  [255 lb 11.7 oz (116 kg)] 255 lb 11.7 oz (116 kg) (10/21 0610) Last BM Date: 03/01/14  Weight change: Filed Weights   02/28/14 1156 03/01/14 0408 03/02/14 0610  Weight: 254 lb 3.1 oz (115.3 kg) 258 lb 4.8 oz (117.164 kg) 255 lb 11.7 oz (116 kg)    Intake/Output:   Intake/Output Summary (Last 24 hours) at 03/02/14 0725 Last data filed at 03/02/14 0610  Gross per 24 hour  Intake    150 ml  Output   3325 ml  Net  -3175 ml     Physical Exam: General:  Chronically ill appearing.  No resp difficulty. On BiPap  HEENT: normal Neck: supple. JVP difficult to assess due to body habitus.  . Carotids 2+ bilat; no bruits. No lymphadenopathy or thryomegaly appreciated. Cor: PMI nondisplaced. Regular rate & rhythm. No rubs, gallops or murmurs. Lungs: Decreased in based.  Abdomen: obese, soft, nontender, nondistended. No hepatosplenomegaly. No bruits or masses. Good bowel sounds. Extremities: no cyanosis, clubbing, rash, edema. R groin ok.  Neuro: alert & orientedx3, cranial nerves grossly intact. moves all 4 extremities w/o difficulty. Affect pleasant   Telemetry: SR 60s   Labs: Basic Metabolic Panel:  Recent Labs Lab 02/26/14 0302 02/27/14 0323 02/28/14 0415 03/01/14 0311 03/02/14 0238  NA 140 139 137 134* 138  K 3.7 4.1 4.0 3.9 3.5*  CL 85*  84* 83* 81* 86*  CO2 44* 45* 43* 41* 41*  GLUCOSE 115* 135* 123* 105* 116*  BUN 85* 95* 98* 106* 97*  CREATININE 1.20* 1.26* 1.17* 1.18* 1.06  CALCIUM 10.4 10.0 9.9 9.6 9.3    Liver Function Tests: No results found for this basename: AST, ALT, ALKPHOS, BILITOT, PROT, ALBUMIN,  in the last 168 hours No results found for this basename: LIPASE, AMYLASE,  in the last 168 hours No results found for this basename: AMMONIA,  in the last 168 hours  CBC:  Recent Labs Lab 02/26/14 0302 02/27/14 0323 02/28/14 0840 03/01/14 0311 03/02/14 0238  WBC 17.1* 19.2* 16.4* 16.7* 17.9*  HGB 12.5  13.1 13.9 13.4 12.7  HCT 39.9 40.6 42.7 41.4 40.0  MCV 90.1 89.4 90.1 90.2 90.3  PLT 404* 421* 460* 455* 404*    Cardiac Enzymes: No results found for this basename: CKTOTAL, CKMB, CKMBINDEX, TROPONINI,  in the last 168 hours  BNP: BNP (last 3 results)  Recent Labs  02/18/14 1609 02/22/14 0947 02/24/14 0332  PROBNP 1539.0* 2319.0* 2410.0*     Other results:  EKG:   Imaging: Ct Chest High Resolution  03/01/2014   CLINICAL DATA:  80 year old female with history of respiratory failure and hypoxemia.  EXAM: CT CHEST WITHOUT CONTRAST  TECHNIQUE: Multidetector CT imaging of the chest was performed following the standard protocol without intravenous contrast. High resolution imaging of the lungs, as well as inspiratory and expiratory imaging, was performed.  COMPARISON:  No priors.  FINDINGS: Mediastinum: Heart size is mildly enlarged. There is no significant pericardial fluid, thickening or pericardial calcification. There is atherosclerosis of the thoracic aorta, the great vessels of the mediastinum and the coronary arteries, including calcified atherosclerotic plaque in the left main and left anterior descending coronary arteries. No pathologically enlarged mediastinal or hilar lymph nodes. Please note that accurate exclusion of hilar adenopathy is limited on noncontrast CT scans. Small hiatal hernia.  Lungs/Pleura: There is a background of mild diffuse ground-glass attenuation with interlobular septal thickening, suggesting a background of mild interstitial pulmonary edema. Associated with this there is diffuse thickening of the bronchi and mild thickening of the peribronchovascular interstitium. High-resolution images also suggest some patchy areas of mild subpleural reticulation, however, assessment is limited by the presence of pulmonary edema. No traction bronchiectasis or frank honeycombing. Inspiratory and expiratory imaging is limited, as both inspiratory and expiratory phase images  appear to have been obtained during inspiration. Trace right pleural effusion layering dependently with some dependent passive atelectasis or scarring in the posterior aspect of the right lower lobe. 4 mm nodule in the apex of the right upper lobe (image 14 of series 3).  Upper Abdomen: Incompletely visualized low-attenuation(-5 HU) left adrenal lesion measuring at least 2.1 cm is likely an adenoma.  Musculoskeletal: There are no aggressive appearing lytic or blastic lesions noted in the visualized portions of the skeleton.  IMPRESSION: 1. The appearance of the lungs suggests a background of very mild interstitial pulmonary edema. This limits assessment of underlying interstitial lung disease. 2. High-resolution images suggests the presence of scattered areas of very mild subpleural reticulation, but again, assessment is limited by the presence of pulmonary edema. Followup high-resolution chest CT after the patient is treated for her acute illness could provide additional diagnostic information if clinically appropriate. At this time, there are no overt imaging findings to suggest usual interstitial pneumonia (UIP). 3. 4 mm nodule in the apex of the right upper lobe (image 14 of series 3). If  the patient is at high risk for bronchogenic carcinoma, follow-up chest CT at 1 year is recommended. If the patient is at low risk, no follow-up is needed. This recommendation follows the consensus statement: Guidelines for Management of Small Pulmonary Nodules Detected on CT Scans: A Statement from the Fleischner Society as published in Radiology 2005; 237:395-400. 4. Cardiomegaly. 5. Atherosclerosis, including left main and left anterior descending coronary artery disease. 6. Incompletely visualized low-attenuation left adrenal nodule likely represents an adenoma.   Electronically Signed   By: Trudie Reed M.D.   On: 03/01/2014 16:37   Nm Pulmonary Perf And Vent  03/01/2014   CLINICAL DATA:  Shortness of breath,  decreasing oxygen saturation, elevated D-dimer, dyspnea, respiratory abnormalities, question pulmonary embolism; personal history of CHF, atrial fibrillation, hypertension, coronary artery disease, prior pulmonary embolism  EXAM: NUCLEAR MEDICINE VENTILATION - PERFUSION LUNG SCAN  TECHNIQUE: Ventilation images were obtained in multiple projections using inhaled aerosol technetium 99 M DTPA. Perfusion images were obtained in multiple projections after intravenous injection of Tc-66m MAA.  RADIOPHARMACEUTICALS:  40 mCi Tc-13m DTPA aerosol inhalation and 6 mCi Tc-67m MAA IV  COMPARISON:  None; correlation chest radiograph 03/01/2014  FINDINGS: Ventilation: Ventilatory abnormalities identified in both lower lobes and in RIGHT upper lobe.  Perfusion: Diminished perfusion in LEFT lower lobe laterally, suspect related to pleural effusion identified on radiograph. LEFT lower lobe perfusion appears less severely impaired be an ventilation. No additional perfusion defects identified.  Chest radiograph demonstrates enlargement of cardiac silhouette with pulmonary vascular congestion, mild interstitial edema and LEFT pleural effusion.  IMPRESSION: Ventilatory abnormalities in both lower lobes and in RIGHT upper lobe.  Diminished perfusion in LEFT lower lobe though better of and accompanying ventilation.  Findings represent a low probability for pulmonary embolism.   Electronically Signed   By: Ulyses Southward M.D.   On: 03/01/2014 13:15   Dg Chest Port 1 View  03/01/2014   CLINICAL DATA:  Respiratory failure  EXAM: PORTABLE CHEST - 1 VIEW  COMPARISON:  02/27/2014  FINDINGS: Cardiomegaly with pulmonary vascular congestion and suspected mild interstitial edema, most prominent in the left perihilar region. Possible small left pleural effusion. No pneumothorax  IMPRESSION: Cardiomegaly with mild interstitial edema and possible small left pleural effusion.   Electronically Signed   By: Charline Bills M.D.   On: 03/01/2014 07:58      Medications:     Scheduled Medications: . allopurinol  100 mg Oral Daily  . amiodarone  200 mg Oral BID  . antiseptic oral rinse  7 mL Mouth Rinse q12n4p  . atorvastatin  10 mg Oral Daily  . budesonide (PULMICORT) nebulizer solution  0.5 mg Nebulization BID  . chlorhexidine  15 mL Mouth Rinse BID  . diclofenac sodium  2 g Topical QID  . diltiazem  120 mg Oral Daily  . escitalopram  20 mg Oral Daily  . feeding supplement (GLUCERNA SHAKE)  237 mL Oral TID BM  . insulin aspart  0-5 Units Subcutaneous QHS  . insulin aspart  0-9 Units Subcutaneous TID WC  . levalbuterol  0.63 mg Nebulization QID  . methylPREDNISolone (SOLU-MEDROL) injection  40 mg Intravenous Daily  . senna-docusate  1 tablet Oral BID  . sodium chloride  3 mL Intravenous Q12H  . Warfarin - Pharmacist Dosing Inpatient   Does not apply q1800    Infusions:    PRN Medications: sodium chloride, acetaminophen, bisacodyl, ondansetron (ZOFRAN) IV, polyethylene glycol, sodium chloride   Assessment/Plan    She has history  of OHS/OSA, paroxysmal atrial fibrillation on flecainide, diastolic CHF, prior DVT/PE, CKD. She was admitted with acute on chronic hypoxemic/hypercarbic respiratory failure. She has been treated with antibiotics for ?PNA, Bipap for OHS/OSA, steroids (solumedrol) and diuretics for possible component of CHF. She has diuresed reasonably well. She remains on oxygen by facemask (on [place] at home).  1. Acute on chronic hypoxemic/hypercarbic respiratory failure: She has been treated with antibiotics, steroids, nebs, Bipap, and diuretics.  Had RHC with normal filling pressures .  2. CHF: Presumed acute on chronic diastolic CHF. Echo showed severe pulmonary hypertension.  Had RHC with normal filling pressures.BUN remains elevated.  Diuretics on hold. Supplement potassium.  3. Pulmonary hypertension: Multiple possible reasons for this. OHS/OSA likely plays a large role. Also has history of PE so would question  possible CTEPH.  V/Q low probabilityfor PE .  4. Atrial fibrillation: Has been on coumadin and flecainide. Off flecainide presumed right heart failure and CKD. Amiodarone 200 mg bid  started.Would continue 200 bid x 1 week then 200 daily x 1 week then will decrease dose down to 100 daily (use lowest possible dose with abnormal baseline lung function).  INR 1.37.  Pharmacy to dose coumadin.  5. CKD: Creatinine not elevated but BUN remains high.  Continue to hold diuretics.   6.Deconditioning--needs to get OOB. Consult PT--> recommendations for LTACH  Length of Stay: 12  CLEGG,AMY NP-C  03/02/2014, 7:25 AM  Advanced Heart Failure Team Pager [phone] (M-F; 7a - 4p)  Please contact [facility] for night-coverage after hours (4p -7a ) and weekends on [email or web address]  Patient seen with NP, agree with the above note. Respiratory failure appears predominantly pulmonary-related with normal right and left heart filling pressures on RHC and only borderline pulmonary HTN. The borderline pulmonary hypertension is likely explanable by hypoxemia. This is surprisingly better than the estimated PA pressure by echo. She does not look volume overloaded on exam either and BUN remains high but coming down. Continue to hold Lasix today, would start her back on Lasix 80 mg daily tomorrow and follow creatinine/BUN closely.  She was on Lasix 80 mg bid at home.   She has PAF. I am concerned about flecainide use with renal failure and history of right heart failure. She would do poorly were she to go back into atrial fibrillation. I have put her on amiodarone. Would continue 200 bid x 1 week then 200 daily x 1 week then will decrease dose down to 100 daily (use lowest possible dose with abnormal baseline lung function).   Marca AnconaDalton Silvester Reierson 03/02/2014 11:44 AM

## 2014-03-02 NOTE — Progress Notes (Signed)
Patient is discharged to [facility] per hospital bed.  Granddaughter, [place], is at the bedside during this transfer.  Consent signed by patient herself.  Patient is alert and oriented.  She is on Venturi Mask during this transfer, foley cath has been discontinued.

## 2014-03-02 NOTE — Discharge Summary (Signed)
Physician Discharge Summary  [name] [name] RUE:[id number] DOB: 03/13/1932 DOA: 02/18/2014  PCP: [name], MD  Admit date: 02/18/2014 Discharge date: 03/02/2014  Time spent: >30 minutes  Recommendations for Outpatient Follow-up:  1. Recommend follow up with PCP and CHF Clinic within1-2 weeks after discharge from [facility] 2. Discharge to [facility] 3. A f/u high resolution CT of the chest should be considered in 1 year to re-evaluate a 4mm RUL nodule  Discharge Diagnoses:    Acute on chronic respiratory failure with hypoxemia due to:       a) CAP (community acquired pneumonia)      b) DHF exacerbation in setting of new diagnosis severe pulmonary HTN and OSA   Essential hypertension   Atrial fibrillation-new start Amiodarone this admission   Acute renal failure on CKD (chronic kidney disease) stage 3, GFR 30-59 ml/min   Hypokalemia   Hx pulmonary embolism   4mm RUL pulmonary nodule - will need f/u imaging in 1year   Discharge Condition: stable  Diet recommendation: Carbohydrate modified, heart healthy  Filed Weights   02/28/14 1156 03/01/14 0408 03/02/14 0610  Weight: 254 lb 3.1 oz (115.3 kg) 258 lb 4.8 oz (117.164 kg) 255 lb 11.7 oz (116 kg)    History of present illness:  80 yo F with known history of chronic diastolic heart failure, obstructive sleep apnea on CPAP nocturnally as well as chronic home O2 at 2.5 L, DVT with PE in 2008, chronic atrial fibrillation on Coumadin, known coronary disease, and chronic lower extremity venous stasis ulcers who presented to the emergency department with complaints of cough associated with shortness of breath and progressive hypoxemia worse from baseline. This had been ongoing for 3-4 weeks.  Upon arrival to the ER the patient was noted to be hypoxemic requiring 5 L of oxygen which was more than her baseline 2.5 L of oxygen. She endorsed productive green cough. Her white cell count was 15,000. Portable chest x-ray  revealed hyperinflation with mild bibasilar opacification which could have be due to atelectasis or infection. She was also noted to have mild vascular congestion.  Hospital Course:  She was admitted to the step down unit with a diagnosis of acute on chronic hypoxemic respiratory failure with differentials including community-acquired pneumonia as well as heart failure exacerbation. Since admission she has received Levaquin for a total of 7 days which is now completed. She continues on IV steroids which are being weaned for possible pneumonitis. She should also received IV Lasix and metolazone for heart failure but due to acute on chronic renal failure post cardiac catheterization diuretics remain on hold. Pulmonary Medicine as well as Cardiology consulted on the patient during the admission. She was started on steroids for presumed pneumonitis. Echocardiogram revealed severe pulmonary hypertension with a reading of 74 mmHg.  She did undergo right-sided heart cath on 10/19. Based on the catheterization findings of normal left and right heart filling pressures and a new diagnosis of pulmonary hypertension on echo it was felt that her her respiratory symptoms are primarily related to noncardiac issues. VQ scan completed this admission demonstrated low probability for pulmonary embolism. High-resolution CT of the chest demonstrated very mild interstitial pulmonary edema as well as scattered areas of very mild subpleural reticulation. She was also found to have a 4 mm nodule in the apex of the right upper lobe of the lung with recommendations for followup CT chest in one year if she is high risk for bronchogenic carcinoma. There was also  an incompletely visualized low attenuation left adrenal nodule likely representing adenoma. An additional high-resolution CT of the chest was recommended after the patient has recovered from her current acute illness.  Acute on chronic hypoxemic respiratory failure due to:  A)  community acquired pneumonia B) diastolic heart failure exacerbation C) new diagnosis severe pulmonary D) progressive sleep apnea now requiring nocturnal BiPAP Completed a course of Levaquin on 10/15. Pan cultures have been negative. Has done well on scheduled Xopenex as well as Pulmicort nebs. Currently on steroid taper with current dose of Solu-Medrol 40 mg IV daily. New finding severe pulmonary hypertension on echocardiogram this admission. Unable to add afterload reduction agents such as nitrates given soft blood pressure readings. Currently requiring Cardizem for rate control. Preadmission flecainide was changed to amiodarone this admission by Cardiology with plans to continue twice a day dosing through 10/27 and to taper down to once daily dosing with final dosage of 100 mg daily given her underlying severe lung disease. V/Q scan without evidence of chronic PE. Diuretics on hold secondary to acute on chronic renal failure. R heart cardiac catheterization revealed normal right and left systolic function with borderline pulmonary hypertension at rest. Cardiology feels respiratory symptoms primarily due to underlying sleep apnea and pulmonary hypertension. Patient weight at admission was 267 pounds and weight on date of discharge 255 pounds. Stable on nocturnal BiPAP. High-resolution CT the chest essentially demonstrating mild edema with recommendations to repeat after current acute process is resolved for more accurate findings. She remains on Ventimask at 40%.  Acute on chronic kidney disease stage III Baseline BUN in the 50s. Diuretics including metolazone and potassium placed on hold after BUN increased to greater than 100. Current BUN is trending downward with BUN 97 creatinine 1.06. Suspect steroid dosing contributing. Urine output excellent.  History of atrial fibrillation On chronic anticoagulation. Flecainide was discontinued this admission due to presumed right heart failure and chronic kidney  disease. She subsequently was started on amiodarone this admission with plans to taper down to 200 mg daily beginning 10/28 and further decrease to 100 mg daily beginning 7 days thereafter with this being her chronic dose.  Chronic anticoagulation/history of PE Pharmacy managing Coumadin dosing. Last INR 1.37 on date of discharge to [facility]. See above regarding workup for chronic PE.  Severe deconditioning Because of inability to wean oxygen and low exercise tolerance it was felt that the patient would best be served by additional stay at [facility]. Please note that palliative medicine evaluation was obtained this admission and despite advisements that her condition will only progress and worsen she was reluctant to consider this prognosis. It was documented that she became frustrated with suggestions that things may not do well and that she was not ready to go home. She has a son that is a physician who attempted to suggest to her that the focus on comfort approach might be best for her. Based on documentation in the palliative medicine notes the patient prefers her son Michele Mcalpine and his wife as well as their daughter Amy to assist with her care and her decision-making. She preferred that the son Onalee Hua who is a physician not be called.  Constipation Continue laxative and bulking agents as tolerated  Diabetes mellitus CBGs have been moderately controlled during the admission. Suspect utilization of steroids influencing. Hemoglobin A1c 5.6 in April 2015. Continue sliding scale insulin for now with recommendation to use facility based protocol. Was not on diabetes medications prior to admission.  Chronic pain/recent encephalopathy Patient was  on low dose OxyContin every 12 hours prior to admission but has remained stable on shorter acting formulations of narcotics this admission. Given her respiratory status would like to minimize utilization of narcotics if possible.   Procedures: 2-D echocardiogram: - Left  ventricle: suggestive of elevated LV filling pressure. The cavity size was normal. There was mild focal basal hypertrophy of the septum. Systolic function was normal. The estimated ejection fraction was in the range of 60% to 65%. Wall motion was normal; there were no regional wall motion abnormalities. There was an increased relative contribution of atrial contraction to ventricular filling. Doppler parameters are consistent with abnormal left ventricular relaxation (grade 1 diastolic dysfunction). - Mitral valve: Calcified annulus. - Right ventricle: The cavity size was mildly dilated. Wall thickness was normal.with increased right ventricular systolic pressure  - Pulmonary arteries: PA peak pressure: 74 mm Hg (S) consistent with severe pulmonary     Consultations:  Cardiology  Palliative Medicine  PCCM  Discharge Exam: Filed Vitals:   03/02/14 1006  BP: 110/54  Pulse:   Temp:   Resp:    Gen: No acute respiratory distress Chest: Clear to auscultation anteriorly but posteriorly patient with scattered fine crackles, 40% Ventimask Cardiac: Regular rate and sinus rhythm, S1-S2, no rubs murmurs or gallops, no peripheral edema, no JVD Abdomen: Soft nontender nondistended without obvious hepatosplenomegaly, no ascites Extremities: Symmetrical in appearance without cyanosis, clubbing or effusion-evidence of a prior skin graft right lower anterior tib-2 Dressings in Pl. to cover areas were healing has not completed   Current Discharge Medication List    START taking these medications   Details  acetaminophen (TYLENOL) 325 MG tablet Take 2 tablets (650 mg total) by mouth every 4 (four) hours as needed for headache or mild pain.    amiodarone (PACERONE) 200 MG tablet Take 1 tablet (200 mg total) by mouth 2 (two) times daily. Beginning 10/28 take 200 mg daily for 7 days then decrease to 100 mg daily    antiseptic oral rinse (CPC / CETYLPYRIDINIUM CHLORIDE 0.05%) 0.05 % LIQD  solution 7 mLs by Mouth Rinse route 2 times daily at 12 noon and 4 pm. Refills: 0    budesonide (PULMICORT) 0.5 MG/2ML nebulizer solution Take 2 mLs (0.5 mg total) by nebulization 2 (two) times daily. Qty: 2 mL, Refills: 12    chlorhexidine (PERIDEX) 0.12 % solution 15 mLs by Mouth Rinse route 2 (two) times daily. Qty: 120 mL, Refills: 0    diclofenac sodium (VOLTAREN) 1 % GEL Apply 2 g topically 4 (four) times daily.    feeding supplement, GLUCERNA SHAKE, (GLUCERNA SHAKE) LIQD Take 237 mLs by mouth 3 (three) times daily between meals. Refills: 0    !! insulin aspart (NOVOLOG) 100 UNIT/ML injection USE FACILITY BASED SSI Qty: 10 mL, Refills: 11    !! insulin aspart (NOVOLOG) 100 UNIT/ML injection USE FACILITY BASED SSI Qty: 10 mL, Refills: 11    levalbuterol (XOPENEX) 0.63 MG/3ML nebulizer solution Take 3 mLs (0.63 mg total) by nebulization 4 (four) times daily. Qty: 3 mL, Refills: 12    methylPREDNISolone sodium succinate (SOLU-MEDROL) 40 mg/mL injection Inject 1 mL (40 mg total) into the vein daily. Qty: 1 each, Refills: 0    ondansetron (ZOFRAN) 4 MG/2ML SOLN injection Inject 2 mLs (4 mg total) into the vein every 6 (six) hours as needed for nausea. Qty: 2 mL, Refills: 0    polyethylene glycol (MIRALAX / GLYCOLAX) packet Take 17 g by mouth daily as needed for mild  constipation, moderate constipation or severe constipation. Qty: 14 each, Refills: 0    senna-docusate (SENOKOT-S) 8.6-50 MG per tablet Take 1 tablet by mouth 2 (two) times daily.    sodium chloride 0.9 % infusion Inject 250 mLs into the vein continuous. Qty: 100 mL, Refills: 0     !! - Potential duplicate medications found. Please discuss with provider.    CONTINUE these medications which have NOT CHANGED   Details  allopurinol (ZYLOPRIM) 100 MG tablet Take 1 tablet (100 mg total) by mouth daily. Qty: 90 tablet, Refills: 3    atorvastatin (LIPITOR) 10 MG tablet Take 1 tablet (10 mg total) by mouth  daily. Qty: 30 tablet, Refills: 2    cetirizine (ZYRTEC ALLERGY) 10 MG tablet Take 10 mg by mouth daily.      Difluprednate (DUREZOL) 0.05 % EMUL Apply 1 drop to eye 2 (two) times daily.    diltiazem (CARDIZEM CD) 120 MG 24 hr capsule Take 1 capsule (120 mg total) by mouth daily. Qty: 30 capsule, Refills: 6    escitalopram (LEXAPRO) 20 MG tablet Take 1 tablet (20 mg total) by mouth daily. Qty: 30 tablet, Refills: 2    moxifloxacin (VIGAMOX) 0.5 % ophthalmic solution Place 1 drop into the left eye 2 (two) times daily.    warfarin (COUMADIN) 2.5 MG tablet Take 1.25-2.5 mg by mouth See admin instructions. 1/2 tablet only on Monday and 1 tablet all the rest of the days      STOP taking these medications     flecainide (TAMBOCOR) 100 MG tablet      furosemide (LASIX) 80 MG tablet      ipratropium (ATROVENT HFA) 17 MCG/ACT inhaler      metolazone (ZAROXOLYN) 2.5 MG tablet      NON FORMULARY      OxyCODONE (OXYCONTIN) 10 mg T12A 12 hr tablet      potassium chloride (K-DUR) 10 MEQ tablet      colchicine 0.6 MG tablet        Allergies  Allergen Reactions  . Prednisone     Eyes swelling Pt is not sure if it was prednisone.    Follow-up Information   Follow up with Kerby Nora, MD. (Call after discharged from [facility] for hospital follow up- recommend achedule appointment for 1-2 weeks after discharge)    Specialty:  Family Medicine   Contact information:   [address] [place] [address] [phone]       Follow up with CLEGG,AMY, NP. (Schedule appointment with CHF clinic within 1-2 weeksd after discharged from [facility])    Specialty:  Nurse Practitioner   Contact information:   [address] [place] [address] [phone]       Significant Diagnostic Studies: Dg Chest 2 View  02/21/2014   CLINICAL DATA:  Shortness of breath and weakness.  EXAM: CHEST  2 VIEW  COMPARISON:  02/18/2014  FINDINGS: Two views of the  chest demonstrates low lung volumes. There are increased densities in both lungs concerning for worsening edema. Heart size appears to be upper limits of normal. Negative for a large pneumothorax.  IMPRESSION: Increased lung densities bilaterally. Findings are concerning for pulmonary edema.   Electronically Signed   By: Richarda Overlie M.D.   On: 02/21/2014 13:16   Dg Chest 2 View  02/18/2014   CLINICAL DATA:  Shortness of breath 1 week. Patient on home oxygen for COPD.  EXAM: CHEST  2 VIEW  COMPARISON:  08/31/2013 and 01/19/2013  FINDINGS: Exam was performed with patient in the sitting position. Patient is rotated to the right. Lungs are hypoinflated with mild prominence of the perihilar markings. There is mild elevation the right hemidiaphragm with scalloping of the right diaphragm unchanged. Possible mild bibasilar opacification which may be due to atelectasis or infection. Stable cardiomegaly. Mild biapical pleural thickening. Degenerative changes of the spine with mild curvature of the thoracic spine convex to the left.  IMPRESSION: Hypoinflation with mild bibasilar opacification which may be due to atelectasis or infection.  Mild cardiomegaly with findings suggesting mild vascular congestion.   Electronically Signed   By: Elberta Fortis M.D.   On: 02/18/2014 17:14   Dg Abd 1 View  02/26/2014   CLINICAL DATA:  Lower abdominal pain and constipation.  EXAM: ABDOMEN - 1 VIEW  COMPARISON:  None.  FINDINGS: No free intraperitoneal air is identified. The bowel gas pattern is nonobstructive. Large stool burden throughout the colon noted.  IMPRESSION: No acute finding.  Large colonic stool burden.   Electronically Signed   By: Drusilla Kanner M.D.   On: 02/26/2014 13:36   Ct Chest High Resolution  03/01/2014   CLINICAL DATA:  80 year old female with history of respiratory failure and hypoxemia.  EXAM: CT CHEST WITHOUT CONTRAST  TECHNIQUE: Multidetector CT imaging of the chest was performed following the standard  protocol without intravenous contrast. High resolution imaging of the lungs, as well as inspiratory and expiratory imaging, was performed.  COMPARISON:  No priors.  FINDINGS: Mediastinum: Heart size is mildly enlarged. There is no significant pericardial fluid, thickening or pericardial calcification. There is atherosclerosis of the thoracic aorta, the great vessels of the mediastinum and the coronary arteries, including calcified atherosclerotic plaque in the left main and left anterior descending coronary arteries. No pathologically enlarged mediastinal or hilar lymph nodes. Please note that accurate exclusion of hilar adenopathy is limited on noncontrast CT scans. Small hiatal hernia.  Lungs/Pleura: There is a background of mild diffuse ground-glass attenuation with interlobular septal thickening, suggesting a background of mild interstitial pulmonary edema. Associated with this there is diffuse thickening of the bronchi and mild thickening of the peribronchovascular interstitium. High-resolution images also suggest some patchy areas of mild subpleural reticulation, however, assessment is limited by the presence of pulmonary edema. No traction bronchiectasis or frank honeycombing. Inspiratory and expiratory imaging is limited, as both inspiratory and expiratory phase images appear to have been obtained during inspiration. Trace right pleural effusion layering dependently with some dependent passive atelectasis or scarring in the posterior aspect of the right lower lobe. 4 mm nodule in the apex of the right upper lobe (image 14 of series 3).  Upper Abdomen: Incompletely visualized low-attenuation(-5 HU) left adrenal lesion measuring at least 2.1 cm is likely an adenoma.  Musculoskeletal: There are no aggressive appearing lytic or blastic lesions noted in the visualized portions of the skeleton.  IMPRESSION: 1. The appearance of the lungs suggests a background of very mild interstitial pulmonary edema. This limits  assessment of underlying interstitial lung disease. 2. High-resolution images suggests the presence of scattered areas of very mild subpleural reticulation, but again, assessment is limited by the presence of pulmonary edema. Followup high-resolution chest CT after the patient is treated for her acute illness could provide additional diagnostic information if clinically appropriate. At this time, there are no overt imaging findings to suggest usual interstitial pneumonia (UIP). 3. 4 mm nodule in the apex of the right upper lobe (image 14 of  series 3). If the patient is at high risk for bronchogenic carcinoma, follow-up chest CT at 1 year is recommended. If the patient is at low risk, no follow-up is needed. This recommendation follows the consensus statement: Guidelines for Management of Small Pulmonary Nodules Detected on CT Scans: A Statement from the Fleischner Society as published in Radiology 2005; 237:395-400. 4. Cardiomegaly. 5. Atherosclerosis, including left main and left anterior descending coronary artery disease. 6. Incompletely visualized low-attenuation left adrenal nodule likely represents an adenoma.   Electronically Signed   By: Trudie Reedaniel  Entrikin M.D.   On: 03/01/2014 16:37   Nm Pulmonary Perf And Vent  03/01/2014   CLINICAL DATA:  Shortness of breath, decreasing oxygen saturation, elevated D-dimer, dyspnea, respiratory abnormalities, question pulmonary embolism; personal history of CHF, atrial fibrillation, hypertension, coronary artery disease, prior pulmonary embolism  EXAM: NUCLEAR MEDICINE VENTILATION - PERFUSION LUNG SCAN  TECHNIQUE: Ventilation images were obtained in multiple projections using inhaled aerosol technetium 99 M DTPA. Perfusion images were obtained in multiple projections after intravenous injection of Tc-3967m MAA.  RADIOPHARMACEUTICALS:  40 mCi Tc-6767m DTPA aerosol inhalation and 6 mCi Tc-6367m MAA IV  COMPARISON:  None; correlation chest radiograph 03/01/2014  FINDINGS:  Ventilation: Ventilatory abnormalities identified in both lower lobes and in RIGHT upper lobe.  Perfusion: Diminished perfusion in LEFT lower lobe laterally, suspect related to pleural effusion identified on radiograph. LEFT lower lobe perfusion appears less severely impaired be an ventilation. No additional perfusion defects identified.  Chest radiograph demonstrates enlargement of cardiac silhouette with pulmonary vascular congestion, mild interstitial edema and LEFT pleural effusion.  IMPRESSION: Ventilatory abnormalities in both lower lobes and in RIGHT upper lobe.  Diminished perfusion in LEFT lower lobe though better of and accompanying ventilation.  Findings represent a low probability for pulmonary embolism.   Electronically Signed   By: Ulyses SouthwardMark  Boles M.D.   On: 03/01/2014 13:15   Dg Chest Port 1 View  03/01/2014   CLINICAL DATA:  Respiratory failure  EXAM: PORTABLE CHEST - 1 VIEW  COMPARISON:  02/27/2014  FINDINGS: Cardiomegaly with pulmonary vascular congestion and suspected mild interstitial edema, most prominent in the left perihilar region. Possible small left pleural effusion. No pneumothorax  IMPRESSION: Cardiomegaly with mild interstitial edema and possible small left pleural effusion.   Electronically Signed   By: Charline BillsSriyesh  Krishnan M.D.   On: 03/01/2014 07:58   Dg Chest Port 1 View  02/27/2014   CLINICAL DATA:  Chronic respiratory failure with hypercapnia.  EXAM: PORTABLE CHEST - 1 VIEW  COMPARISON:  02/24/2014  FINDINGS: The heart size appears moderately enlarged. There is a left pleural effusion moderate pulmonary edema. This is unchanged from previous exam. Diminished lung volumes and pulmonary vascular congestion is unchanged.  IMPRESSION: 1. Stable left effusion and pulmonary vascular congestion.   Electronically Signed   By: Signa Kellaylor  Stroud M.D.   On: 02/27/2014 09:37   Dg Chest Port 1 View  02/24/2014   CLINICAL DATA:  Pulmonary edema ; persistent shortness of breath  EXAM: PORTABLE  CHEST - 1 VIEW  COMPARISON:  February 21, 2014  FINDINGS: There is again noted cardiomegaly with mild pulmonary venous hypertension. There is generalized interstitial edema, stable. There is airspace consolidation in the left base. There is a small left effusion, stable. There is no new opacity.  IMPRESSION: Evidence of a degree of congestive heart failure, stable. Consolidation the left base may represent alveolar edema or superimposed pneumonia. The appearance is stable compared to recent prior study.   Electronically Signed  By: Bretta Bang M.D.   On: 02/24/2014 09:54    Microbiology: Recent Results (from the past 240 hour(s))  CULTURE, EXPECTORATED SPUTUM-ASSESSMENT     Status: None   Collection Time    02/24/14  9:20 PM      Result Value Ref Range Status   Specimen Description SPUTUM   Final   Special Requests NONE   Final   Sputum evaluation     Final   Value: THIS SPECIMEN IS ACCEPTABLE. RESPIRATORY CULTURE REPORT TO FOLLOW.   Report Status 02/24/2014 FINAL   Final  CULTURE, RESPIRATORY (NON-EXPECTORATED)     Status: None   Collection Time    02/24/14  9:20 PM      Result Value Ref Range Status   Specimen Description SPUTUM   Final   Special Requests NONE   Final   Gram Stain     Final   Value: RARE WBC PRESENT, PREDOMINANTLY MONONUCLEAR     RARE SQUAMOUS EPITHELIAL CELLS PRESENT     MODERATE GRAM POSITIVE COCCI     IN PAIRS IN CLUSTERS     Performed at [facility]   Culture     Final   Value: NORMAL OROPHARYNGEAL FLORA     Performed at [facility]   Report Status 02/27/2014 FINAL   Final     Labs: Basic Metabolic Panel:  Recent Labs Lab 02/26/14 0302 02/27/14 0323 02/28/14 0415 03/01/14 0311 03/02/14 0238  NA 140 139 137 134* 138  K 3.7 4.1 4.0 3.9 3.5*  CL 85* 84* 83* 81* 86*  CO2 44* 45* 43* 41* 41*  GLUCOSE 115* 135* 123* 105* 116*  BUN 85* 95* 98* 106* 97*  CREATININE 1.20* 1.26* 1.17* 1.18* 1.06  CALCIUM 10.4 10.0 9.9 9.6 9.3    Liver Function Tests: No results found for this basename: AST, ALT, ALKPHOS, BILITOT, PROT, ALBUMIN,  in the last 168 hours No results found for this basename: LIPASE, AMYLASE,  in the last 168 hours No results found for this basename: AMMONIA,  in the last 168 hours CBC:  Recent Labs Lab 02/26/14 0302 02/27/14 0323 02/28/14 0840 03/01/14 0311 03/02/14 0238  WBC 17.1* 19.2* 16.4* 16.7* 17.9*  HGB 12.5 13.1 13.9 13.4 12.7  HCT 39.9 40.6 42.7 41.4 40.0  MCV 90.1 89.4 90.1 90.2 90.3  PLT 404* 421* 460* 455* 404*   BNP (last 3 results)  Recent Labs  02/18/14 1609 02/22/14 0947 02/24/14 0332  PROBNP 1539.0* 2319.0* 2410.0*   CBG:  Recent Labs Lab 03/01/14 0845 03/01/14 1255 03/01/14 1642 03/01/14 2225 03/02/14 0839  GLUCAP 113* 142* 238* 152* 93   Signed:  ELLIS,ALLISON L. ANP [facility] 03/02/2014, 10:28 AM  I have personally reviewed the entire database. I have reviewed the above note, made any necessary editorial changes, and agree with its content.  Lonia Blood, MD [facility]

## 2014-03-03 LAB — BLOOD GAS, ARTERIAL
Acid-Base Excess: 15.8 mmol/L — ABNORMAL HIGH (ref 0.0–2.0)
Bicarbonate: 40.5 mEq/L — ABNORMAL HIGH (ref 20.0–24.0)
DRAWN BY: 249101
O2 CONTENT: 4 L/min
O2 Saturation: 98.4 %
PATIENT TEMPERATURE: 98.6
PH ART: 7.489 — AB (ref 7.350–7.450)
TCO2: 42.2 mmol/L (ref 0–100)
pCO2 arterial: 53.9 mmHg — ABNORMAL HIGH (ref 35.0–45.0)
pO2, Arterial: 119 mmHg — ABNORMAL HIGH (ref 80.0–100.0)

## 2014-03-03 LAB — HEMOGLOBIN A1C
Hgb A1c MFr Bld: 6.2 % — ABNORMAL HIGH (ref <5.7)
MEAN PLASMA GLUCOSE: 131 mg/dL — AB (ref <117)

## 2014-03-03 LAB — COMPREHENSIVE METABOLIC PANEL
ALBUMIN: 3 g/dL — AB (ref 3.5–5.2)
ALK PHOS: 79 U/L (ref 39–117)
ALT: 21 U/L (ref 0–35)
AST: 21 U/L (ref 0–37)
Anion gap: 12 (ref 5–15)
BILIRUBIN TOTAL: 0.4 mg/dL (ref 0.3–1.2)
BUN: 78 mg/dL — AB (ref 6–23)
CHLORIDE: 88 meq/L — AB (ref 96–112)
CO2: 39 meq/L — AB (ref 19–32)
CREATININE: 1.03 mg/dL (ref 0.50–1.10)
Calcium: 9.9 mg/dL (ref 8.4–10.5)
GFR calc Af Amer: 58 mL/min — ABNORMAL LOW (ref >90)
GFR calc non Af Amer: 50 mL/min — ABNORMAL LOW (ref >90)
Glucose, Bld: 118 mg/dL — ABNORMAL HIGH (ref 70–99)
POTASSIUM: 3.5 meq/L — AB (ref 3.7–5.3)
Sodium: 139 mEq/L (ref 137–147)
Total Protein: 7.5 g/dL (ref 6.0–8.3)

## 2014-03-03 LAB — CBC WITH DIFFERENTIAL/PLATELET
BASOS PCT: 0 % (ref 0–1)
Basophils Absolute: 0 10*3/uL (ref 0.0–0.1)
Eosinophils Absolute: 0.1 10*3/uL (ref 0.0–0.7)
Eosinophils Relative: 0 % (ref 0–5)
HCT: 43.2 % (ref 36.0–46.0)
HEMOGLOBIN: 13.7 g/dL (ref 12.0–15.0)
Lymphocytes Relative: 6 % — ABNORMAL LOW (ref 12–46)
Lymphs Abs: 1.2 10*3/uL (ref 0.7–4.0)
MCH: 29 pg (ref 26.0–34.0)
MCHC: 31.7 g/dL (ref 30.0–36.0)
MCV: 91.3 fL (ref 78.0–100.0)
MONOS PCT: 4 % (ref 3–12)
Monocytes Absolute: 0.7 10*3/uL (ref 0.1–1.0)
NEUTROS ABS: 17.1 10*3/uL — AB (ref 1.7–7.7)
Neutrophils Relative %: 90 % — ABNORMAL HIGH (ref 43–77)
Platelets: 420 10*3/uL — ABNORMAL HIGH (ref 150–400)
RBC: 4.73 MIL/uL (ref 3.87–5.11)
RDW: 16.2 % — ABNORMAL HIGH (ref 11.5–15.5)
WBC: 19.2 10*3/uL — ABNORMAL HIGH (ref 4.0–10.5)

## 2014-03-03 LAB — TSH: TSH: 4.18 u[IU]/mL (ref 0.350–4.500)

## 2014-03-03 LAB — VITAMIN B12: VITAMIN B 12: 919 pg/mL — AB (ref 211–911)

## 2014-03-03 LAB — LIPID PANEL
CHOLESTEROL: 115 mg/dL (ref 0–200)
HDL: 49 mg/dL (ref >39)
LDL Cholesterol: 44 mg/dL (ref 0–99)
TRIGLYCERIDES: 110 mg/dL (ref <150)
Total CHOL/HDL Ratio: 2.3 RATIO
VLDL: 22 mg/dL (ref 0–40)

## 2014-03-03 LAB — PROTIME-INR
INR: 1.42 (ref 0.00–1.49)
Prothrombin Time: 17.5 seconds — ABNORMAL HIGH (ref 11.6–15.2)

## 2014-03-03 LAB — PHOSPHORUS: Phosphorus: 3.3 mg/dL (ref 2.3–4.6)

## 2014-03-03 LAB — MAGNESIUM: MAGNESIUM: 2.8 mg/dL — AB (ref 1.5–2.5)

## 2014-03-03 LAB — PROCALCITONIN: Procalcitonin: <0.1 ng/mL

## 2014-03-03 LAB — FERRITIN: Ferritin: 167 ng/mL (ref 10–291)

## 2014-03-03 LAB — IRON: IRON: 136 ug/dL — AB (ref 42–135)

## 2014-03-04 LAB — BASIC METABOLIC PANEL
Anion gap: 14 (ref 5–15)
BUN: 71 mg/dL — AB (ref 6–23)
CHLORIDE: 89 meq/L — AB (ref 96–112)
CO2: 34 mEq/L — ABNORMAL HIGH (ref 19–32)
CREATININE: 0.99 mg/dL (ref 0.50–1.10)
Calcium: 9.4 mg/dL (ref 8.4–10.5)
GFR, EST AFRICAN AMERICAN: 61 mL/min — AB (ref >90)
GFR, EST NON AFRICAN AMERICAN: 52 mL/min — AB (ref >90)
Glucose, Bld: 143 mg/dL — ABNORMAL HIGH (ref 70–99)
Potassium: 4.2 mEq/L (ref 3.7–5.3)
Sodium: 137 mEq/L (ref 137–147)

## 2014-03-04 LAB — CBC WITH DIFFERENTIAL/PLATELET
Basophils Absolute: 0 10*3/uL (ref 0.0–0.1)
Basophils Relative: 0 % (ref 0–1)
EOS ABS: 0 10*3/uL (ref 0.0–0.7)
Eosinophils Relative: 0 % (ref 0–5)
HEMATOCRIT: 39.8 % (ref 36.0–46.0)
HEMOGLOBIN: 12.6 g/dL (ref 12.0–15.0)
Lymphocytes Relative: 3 % — ABNORMAL LOW (ref 12–46)
Lymphs Abs: 0.5 10*3/uL — ABNORMAL LOW (ref 0.7–4.0)
MCH: 28.3 pg (ref 26.0–34.0)
MCHC: 31.7 g/dL (ref 30.0–36.0)
MCV: 89.4 fL (ref 78.0–100.0)
MONO ABS: 0.2 10*3/uL (ref 0.1–1.0)
MONOS PCT: 1 % — AB (ref 3–12)
NEUTROS ABS: 14 10*3/uL — AB (ref 1.7–7.7)
NEUTROS PCT: 96 % — AB (ref 43–77)
Platelets: 377 10*3/uL (ref 150–400)
RBC: 4.45 MIL/uL (ref 3.87–5.11)
RDW: 16.3 % — AB (ref 11.5–15.5)
WBC: 14.7 10*3/uL — ABNORMAL HIGH (ref 4.0–10.5)

## 2014-03-04 LAB — PHOSPHORUS: Phosphorus: 3.8 mg/dL (ref 2.3–4.6)

## 2014-03-04 LAB — PROTIME-INR
INR: 1.5 — AB (ref 0.00–1.49)
PROTHROMBIN TIME: 18.3 s — AB (ref 11.6–15.2)

## 2014-03-04 LAB — FOLATE RBC: RBC Folate: 872 ng/mL — ABNORMAL HIGH (ref >280)

## 2014-03-04 LAB — MAGNESIUM: Magnesium: 2.8 mg/dL — ABNORMAL HIGH (ref 1.5–2.5)

## 2014-03-05 LAB — PROTIME-INR
INR: 1.54 — ABNORMAL HIGH (ref 0.00–1.49)
PROTHROMBIN TIME: 18.7 s — AB (ref 11.6–15.2)

## 2014-03-05 LAB — BASIC METABOLIC PANEL
Anion gap: 8 (ref 5–15)
BUN: 66 mg/dL — AB (ref 6–23)
CALCIUM: 9.6 mg/dL (ref 8.4–10.5)
CO2: 37 meq/L — AB (ref 19–32)
CREATININE: 1.12 mg/dL — AB (ref 0.50–1.10)
Chloride: 93 mEq/L — ABNORMAL LOW (ref 96–112)
GFR calc Af Amer: 52 mL/min — ABNORMAL LOW (ref >90)
GFR, EST NON AFRICAN AMERICAN: 45 mL/min — AB (ref >90)
Glucose, Bld: 108 mg/dL — ABNORMAL HIGH (ref 70–99)
Potassium: 3.8 mEq/L (ref 3.7–5.3)
Sodium: 138 mEq/L (ref 137–147)

## 2014-03-05 LAB — CBC
HEMATOCRIT: 37.6 % (ref 36.0–46.0)
HEMOGLOBIN: 12.2 g/dL (ref 12.0–15.0)
MCH: 29 pg (ref 26.0–34.0)
MCHC: 32.4 g/dL (ref 30.0–36.0)
MCV: 89.3 fL (ref 78.0–100.0)
Platelets: 377 10*3/uL (ref 150–400)
RBC: 4.21 MIL/uL (ref 3.87–5.11)
RDW: 16.7 % — ABNORMAL HIGH (ref 11.5–15.5)
WBC: 17.8 10*3/uL — ABNORMAL HIGH (ref 4.0–10.5)

## 2014-03-05 LAB — URINE CULTURE: Colony Count: >=100000

## 2014-03-06 LAB — PROTIME-INR
INR: 1.43 (ref 0.00–1.49)
PROTHROMBIN TIME: 17.6 s — AB (ref 11.6–15.2)

## 2014-03-07 LAB — CBC
HEMATOCRIT: 38 % (ref 36.0–46.0)
HEMOGLOBIN: 12.3 g/dL (ref 12.0–15.0)
MCH: 29.3 pg (ref 26.0–34.0)
MCHC: 32.4 g/dL (ref 30.0–36.0)
MCV: 90.5 fL (ref 78.0–100.0)
Platelets: 351 10*3/uL (ref 150–400)
RBC: 4.2 MIL/uL (ref 3.87–5.11)
RDW: 16.6 % — AB (ref 11.5–15.5)
WBC: 16.4 10*3/uL — AB (ref 4.0–10.5)

## 2014-03-07 LAB — BASIC METABOLIC PANEL
ANION GAP: 12 (ref 5–15)
BUN: 50 mg/dL — AB (ref 6–23)
CHLORIDE: 95 meq/L — AB (ref 96–112)
CO2: 33 meq/L — AB (ref 19–32)
Calcium: 9.4 mg/dL (ref 8.4–10.5)
Creatinine, Ser: 0.98 mg/dL (ref 0.50–1.10)
GFR calc non Af Amer: 53 mL/min — ABNORMAL LOW (ref >90)
GFR, EST AFRICAN AMERICAN: 61 mL/min — AB (ref >90)
Glucose, Bld: 102 mg/dL — ABNORMAL HIGH (ref 70–99)
POTASSIUM: 3.7 meq/L (ref 3.7–5.3)
Sodium: 140 mEq/L (ref 137–147)

## 2014-03-07 LAB — PROTIME-INR
INR: 1.52 — AB (ref 0.00–1.49)
Prothrombin Time: 18.4 seconds — ABNORMAL HIGH (ref 11.6–15.2)

## 2014-03-07 LAB — PHOSPHORUS: Phosphorus: 3.2 mg/dL (ref 2.3–4.6)

## 2014-03-07 LAB — PRO B NATRIURETIC PEPTIDE: PRO B NATRI PEPTIDE: 437.5 pg/mL (ref 0–450)

## 2014-03-07 LAB — MAGNESIUM: Magnesium: 2.6 mg/dL — ABNORMAL HIGH (ref 1.5–2.5)

## 2014-03-08 LAB — PROTIME-INR
INR: 1.86 — ABNORMAL HIGH (ref 0.00–1.49)
PROTHROMBIN TIME: 21.6 s — AB (ref 11.6–15.2)

## 2014-03-09 LAB — BASIC METABOLIC PANEL
Anion gap: 11 (ref 5–15)
BUN: 37 mg/dL — AB (ref 6–23)
CO2: 31 mEq/L (ref 19–32)
Calcium: 9.2 mg/dL (ref 8.4–10.5)
Chloride: 99 mEq/L (ref 96–112)
Creatinine, Ser: 0.9 mg/dL (ref 0.50–1.10)
GFR calc Af Amer: 68 mL/min — ABNORMAL LOW (ref >90)
GFR calc non Af Amer: 59 mL/min — ABNORMAL LOW (ref >90)
GLUCOSE: 82 mg/dL (ref 70–99)
Potassium: 3.8 mEq/L (ref 3.7–5.3)
Sodium: 141 mEq/L (ref 137–147)

## 2014-03-09 LAB — PROTIME-INR
INR: 2.27 — ABNORMAL HIGH (ref 0.00–1.49)
PROTHROMBIN TIME: 25.2 s — AB (ref 11.6–15.2)

## 2014-03-09 LAB — CBC
HCT: 36.5 % (ref 36.0–46.0)
HEMOGLOBIN: 11.7 g/dL — AB (ref 12.0–15.0)
MCH: 29.3 pg (ref 26.0–34.0)
MCHC: 32.1 g/dL (ref 30.0–36.0)
MCV: 91.3 fL (ref 78.0–100.0)
Platelets: 324 10*3/uL (ref 150–400)
RBC: 4 MIL/uL (ref 3.87–5.11)
RDW: 17.2 % — AB (ref 11.5–15.5)
WBC: 14.8 10*3/uL — ABNORMAL HIGH (ref 4.0–10.5)

## 2014-03-10 LAB — URINE MICROSCOPIC-ADD ON

## 2014-03-10 LAB — URINALYSIS, ROUTINE W REFLEX MICROSCOPIC
BILIRUBIN URINE: NEGATIVE
GLUCOSE, UA: NEGATIVE mg/dL
Hgb urine dipstick: NEGATIVE
Ketones, ur: NEGATIVE mg/dL
Nitrite: NEGATIVE
PH: 5 (ref 5.0–8.0)
Protein, ur: NEGATIVE mg/dL
SPECIFIC GRAVITY, URINE: 1.023 (ref 1.005–1.030)
Urobilinogen, UA: 0.2 mg/dL (ref 0.0–1.0)

## 2014-03-10 LAB — PROTIME-INR
INR: 2.67 — ABNORMAL HIGH (ref 0.00–1.49)
Prothrombin Time: 28.7 seconds — ABNORMAL HIGH (ref 11.6–15.2)

## 2014-03-11 LAB — PROTIME-INR
INR: 2.85 — ABNORMAL HIGH (ref 0.00–1.49)
Prothrombin Time: 30.2 seconds — ABNORMAL HIGH (ref 11.6–15.2)

## 2014-03-11 LAB — CBC
HCT: 38.3 % (ref 36.0–46.0)
HEMOGLOBIN: 12.4 g/dL (ref 12.0–15.0)
MCH: 29.4 pg (ref 26.0–34.0)
MCHC: 32.4 g/dL (ref 30.0–36.0)
MCV: 90.8 fL (ref 78.0–100.0)
Platelets: 282 10*3/uL (ref 150–400)
RBC: 4.22 MIL/uL (ref 3.87–5.11)
RDW: 18 % — ABNORMAL HIGH (ref 11.5–15.5)
WBC: 11.4 10*3/uL — ABNORMAL HIGH (ref 4.0–10.5)

## 2014-03-11 LAB — BASIC METABOLIC PANEL
ANION GAP: 12 (ref 5–15)
BUN: 31 mg/dL — ABNORMAL HIGH (ref 6–23)
CO2: 27 meq/L (ref 19–32)
Calcium: 9.1 mg/dL (ref 8.4–10.5)
Chloride: 103 mEq/L (ref 96–112)
Creatinine, Ser: 0.92 mg/dL (ref 0.50–1.10)
GFR, EST AFRICAN AMERICAN: 66 mL/min — AB (ref >90)
GFR, EST NON AFRICAN AMERICAN: 57 mL/min — AB (ref >90)
GLUCOSE: 69 mg/dL — AB (ref 70–99)
Potassium: 3.9 mEq/L (ref 3.7–5.3)
Sodium: 142 mEq/L (ref 137–147)

## 2014-03-12 LAB — PROTIME-INR
INR: 2.12 — ABNORMAL HIGH (ref 0.00–1.49)
PROTHROMBIN TIME: 23.9 s — AB (ref 11.6–15.2)

## 2014-03-13 LAB — PROTIME-INR
INR: 1.62 — ABNORMAL HIGH (ref 0.00–1.49)
Prothrombin Time: 19.4 seconds — ABNORMAL HIGH (ref 11.6–15.2)

## 2014-03-13 LAB — URINE CULTURE
Colony Count: >=100000
SPECIAL REQUESTS: NORMAL

## 2014-03-14 LAB — CBC WITH DIFFERENTIAL/PLATELET
Basophils Absolute: 0 10*3/uL (ref 0.0–0.1)
Basophils Relative: 0 % (ref 0–1)
EOS ABS: 0.3 10*3/uL (ref 0.0–0.7)
Eosinophils Relative: 3 % (ref 0–5)
HCT: 39 % (ref 36.0–46.0)
Hemoglobin: 12.3 g/dL (ref 12.0–15.0)
Lymphocytes Relative: 9 % — ABNORMAL LOW (ref 12–46)
Lymphs Abs: 0.9 10*3/uL (ref 0.7–4.0)
MCH: 29.3 pg (ref 26.0–34.0)
MCHC: 31.5 g/dL (ref 30.0–36.0)
MCV: 92.9 fL (ref 78.0–100.0)
MONOS PCT: 5 % (ref 3–12)
Monocytes Absolute: 0.5 10*3/uL (ref 0.1–1.0)
NEUTROS PCT: 83 % — AB (ref 43–77)
Neutro Abs: 8.3 10*3/uL — ABNORMAL HIGH (ref 1.7–7.7)
Platelets: 206 10*3/uL (ref 150–400)
RBC: 4.2 MIL/uL (ref 3.87–5.11)
RDW: 18.2 % — ABNORMAL HIGH (ref 11.5–15.5)
WBC: 10 10*3/uL (ref 4.0–10.5)

## 2014-03-14 LAB — BASIC METABOLIC PANEL
Anion gap: 13 (ref 5–15)
BUN: 25 mg/dL — ABNORMAL HIGH (ref 6–23)
CO2: 24 meq/L (ref 19–32)
Calcium: 9.2 mg/dL (ref 8.4–10.5)
Chloride: 105 mEq/L (ref 96–112)
Creatinine, Ser: 0.82 mg/dL (ref 0.50–1.10)
GFR calc Af Amer: 76 mL/min — ABNORMAL LOW (ref >90)
GFR, EST NON AFRICAN AMERICAN: 66 mL/min — AB (ref >90)
GLUCOSE: 67 mg/dL — AB (ref 70–99)
POTASSIUM: 4.5 meq/L (ref 3.7–5.3)
Sodium: 142 mEq/L (ref 137–147)

## 2014-03-14 LAB — PROTIME-INR
INR: 1.53 — ABNORMAL HIGH (ref 0.00–1.49)
Prothrombin Time: 18.6 seconds — ABNORMAL HIGH (ref 11.6–15.2)

## 2014-03-15 LAB — PROTIME-INR
INR: 1.65 — AB (ref 0.00–1.49)
Prothrombin Time: 19.6 seconds — ABNORMAL HIGH (ref 11.6–15.2)

## 2014-03-16 LAB — PROTIME-INR
INR: 1.87 — AB (ref 0.00–1.49)
Prothrombin Time: 21.7 seconds — ABNORMAL HIGH (ref 11.6–15.2)

## 2014-03-17 LAB — BASIC METABOLIC PANEL
ANION GAP: 12 (ref 5–15)
BUN: 19 mg/dL (ref 6–23)
CHLORIDE: 108 meq/L (ref 96–112)
CO2: 25 mEq/L (ref 19–32)
Calcium: 8.8 mg/dL (ref 8.4–10.5)
Creatinine, Ser: 0.84 mg/dL (ref 0.50–1.10)
GFR calc Af Amer: 74 mL/min — ABNORMAL LOW (ref >90)
GFR calc non Af Amer: 64 mL/min — ABNORMAL LOW (ref >90)
Glucose, Bld: 79 mg/dL (ref 70–99)
POTASSIUM: 3.8 meq/L (ref 3.7–5.3)
SODIUM: 145 meq/L (ref 137–147)

## 2014-03-17 LAB — PROTIME-INR
INR: 1.95 — AB (ref 0.00–1.49)
PROTHROMBIN TIME: 22.4 s — AB (ref 11.6–15.2)

## 2014-03-17 LAB — CBC WITH DIFFERENTIAL/PLATELET
BASOS ABS: 0 10*3/uL (ref 0.0–0.1)
Basophils Relative: 0 % (ref 0–1)
EOS ABS: 0.4 10*3/uL (ref 0.0–0.7)
Eosinophils Relative: 5 % (ref 0–5)
HCT: 34.2 % — ABNORMAL LOW (ref 36.0–46.0)
HEMOGLOBIN: 10.9 g/dL — AB (ref 12.0–15.0)
Lymphocytes Relative: 8 % — ABNORMAL LOW (ref 12–46)
Lymphs Abs: 0.7 10*3/uL (ref 0.7–4.0)
MCH: 29.1 pg (ref 26.0–34.0)
MCHC: 31.9 g/dL (ref 30.0–36.0)
MCV: 91.4 fL (ref 78.0–100.0)
MONOS PCT: 8 % (ref 3–12)
Monocytes Absolute: 0.7 10*3/uL (ref 0.1–1.0)
NEUTROS ABS: 6.7 10*3/uL (ref 1.7–7.7)
NEUTROS PCT: 79 % — AB (ref 43–77)
PLATELETS: 144 10*3/uL — AB (ref 150–400)
RBC: 3.74 MIL/uL — ABNORMAL LOW (ref 3.87–5.11)
RDW: 18.3 % — AB (ref 11.5–15.5)
WBC: 8.5 10*3/uL (ref 4.0–10.5)

## 2014-03-18 LAB — PROTIME-INR
INR: 1.99 — AB (ref 0.00–1.49)
PROTHROMBIN TIME: 22.8 s — AB (ref 11.6–15.2)

## 2014-03-19 ENCOUNTER — Other Ambulatory Visit (HOSPITAL_COMMUNITY): Payer: Medicare Other

## 2014-03-19 LAB — BASIC METABOLIC PANEL
ANION GAP: 14 (ref 5–15)
BUN: 17 mg/dL (ref 6–23)
CALCIUM: 8.9 mg/dL (ref 8.4–10.5)
CO2: 21 mEq/L (ref 19–32)
CREATININE: 0.81 mg/dL (ref 0.50–1.10)
Chloride: 107 mEq/L (ref 96–112)
GFR calc Af Amer: 77 mL/min — ABNORMAL LOW (ref >90)
GFR calc non Af Amer: 67 mL/min — ABNORMAL LOW (ref >90)
Glucose, Bld: 95 mg/dL (ref 70–99)
Potassium: 4.4 mEq/L (ref 3.7–5.3)
SODIUM: 142 meq/L (ref 137–147)

## 2014-03-19 LAB — CBC
HCT: 34.5 % — ABNORMAL LOW (ref 36.0–46.0)
Hemoglobin: 10.8 g/dL — ABNORMAL LOW (ref 12.0–15.0)
MCH: 28.9 pg (ref 26.0–34.0)
MCHC: 31.3 g/dL (ref 30.0–36.0)
MCV: 92.2 fL (ref 78.0–100.0)
PLATELETS: 136 10*3/uL — AB (ref 150–400)
RBC: 3.74 MIL/uL — ABNORMAL LOW (ref 3.87–5.11)
RDW: 18.5 % — ABNORMAL HIGH (ref 11.5–15.5)
WBC: 7.9 10*3/uL (ref 4.0–10.5)

## 2014-03-19 LAB — PROTIME-INR
INR: 1.69 — ABNORMAL HIGH (ref 0.00–1.49)
PROTHROMBIN TIME: 20.1 s — AB (ref 11.6–15.2)

## 2014-03-20 LAB — PROTIME-INR
INR: 1.77 — ABNORMAL HIGH (ref 0.00–1.49)
Prothrombin Time: 20.8 seconds — ABNORMAL HIGH (ref 11.6–15.2)

## 2014-03-21 LAB — BASIC METABOLIC PANEL
Anion gap: 12 (ref 5–15)
BUN: 15 mg/dL (ref 6–23)
CHLORIDE: 106 meq/L (ref 96–112)
CO2: 26 mEq/L (ref 19–32)
CREATININE: 0.88 mg/dL (ref 0.50–1.10)
Calcium: 8.8 mg/dL (ref 8.4–10.5)
GFR calc Af Amer: 70 mL/min — ABNORMAL LOW (ref >90)
GFR calc non Af Amer: 60 mL/min — ABNORMAL LOW (ref >90)
GLUCOSE: 90 mg/dL (ref 70–99)
POTASSIUM: 3.1 meq/L — AB (ref 3.7–5.3)
Sodium: 144 mEq/L (ref 137–147)

## 2014-03-21 LAB — CBC
HCT: 30.8 % — ABNORMAL LOW (ref 36.0–46.0)
Hemoglobin: 9.7 g/dL — ABNORMAL LOW (ref 12.0–15.0)
MCH: 28.7 pg (ref 26.0–34.0)
MCHC: 31.5 g/dL (ref 30.0–36.0)
MCV: 91.1 fL (ref 78.0–100.0)
Platelets: 134 10*3/uL — ABNORMAL LOW (ref 150–400)
RBC: 3.38 MIL/uL — ABNORMAL LOW (ref 3.87–5.11)
RDW: 18.1 % — AB (ref 11.5–15.5)
WBC: 6.2 10*3/uL (ref 4.0–10.5)

## 2014-03-21 LAB — PROTIME-INR
INR: 1.93 — ABNORMAL HIGH (ref 0.00–1.49)
Prothrombin Time: 22.3 seconds — ABNORMAL HIGH (ref 11.6–15.2)

## 2014-03-22 ENCOUNTER — Ambulatory Visit (INDEPENDENT_AMBULATORY_CARE_PROVIDER_SITE_OTHER): Payer: Medicare Other | Admitting: Cardiovascular Disease

## 2014-03-22 ENCOUNTER — Telehealth: Payer: Self-pay | Admitting: *Deleted

## 2014-03-22 ENCOUNTER — Telehealth: Payer: Self-pay

## 2014-03-22 DIAGNOSIS — I4891 Unspecified atrial fibrillation: Secondary | ICD-10-CM

## 2014-03-22 DIAGNOSIS — Z5181 Encounter for therapeutic drug level monitoring: Secondary | ICD-10-CM

## 2014-03-22 LAB — POCT INR: INR: 2.3

## 2014-03-22 MED ORDER — POTASSIUM CHLORIDE CRYS ER 20 MEQ PO TBCR: 20.0000 meq | EXTENDED_RELEASE_TABLET | Freq: Every day | ORAL | Status: DC

## 2014-03-22 MED ORDER — FUROSEMIDE 40 MG PO TABS: 40.0000 mg | ORAL_TABLET | Freq: Every day | ORAL | Status: DC

## 2014-03-22 MED ORDER — TORSEMIDE 20 MG PO TABS: 40.0000 mg | ORAL_TABLET | Freq: Every day | ORAL | Status: DC

## 2014-03-22 MED ORDER — TORSEMIDE 20 MG PO TABS: 40.0000 mg | ORAL_TABLET | Freq: Two times a day (BID) | ORAL | Status: DC

## 2014-03-22 NOTE — Telephone Encounter (Signed)
-  Discharge summary indicate no Lasix or KCl. -Patient's first phone indicates that she was not taking any diuretic. So, now she is stating that she has been on Lasix 80 mg daily?   -We have to know 100% what she is taking for her safety -She very clearly stated she was discharged without a diuretic and wanted one sent in ASAP - now she states she has been on one.   Patient needs to be seen: -Please call the flex clinic in [place] to get her in this afternoon for evaluation and treatment. -would prefer eyes & ears be landed upon her prior to the addition of further treatment

## 2014-03-22 NOTE — Telephone Encounter (Signed)
Pt's home health nurse called reiterate pt's problems. Clarified pt's home meds w/ her.  She states that pt has been taking furosemide 80mg  daily.  Advised her of Dr. Windell HummingbirdGollan's recommendations: -Stop furosemide -Start torsemide

## 2014-03-22 NOTE — Telephone Encounter (Signed)
Attempted to contact pt.  No answer, voicemail box not set up. 

## 2014-03-22 NOTE — Telephone Encounter (Signed)
-  She was discharged on 03/02/2014, why has she not followed up with the CHF clinic? She was to follow up with them in 1-2 weeks (her appt is not until 11/20).   -Patient was receiving IV Lasix and metolazone while in the hospital for diuresis however these were held 2/2 renal insufficiency - I have labs from 11/9 that indicate normal SCr (0.88) and hypokalemia (3.1).     -Her BP was soft while inpatient limiting afterload reduction making management of this over the phone difficult, if not impossible.   -Given the recent labs that indicate stable renal function we can start the following: --Lasix 40 mg bid --KCL 20 meq daily --Monitor daily weights --Monitor BP --Please check BMET in 1 week

## 2014-03-22 NOTE — Telephone Encounter (Signed)
Pt called back stating that she got her son to wrap her legs, but what she describes to be an "egg full of fluid" has appeared on her leg.  Pt is quite anxious and states that she would like her phone calls addressed today.  Advised pt that we are seeing pts and usually discuss phone calls after clinic, but I will call her today w/ Dr. Windell HummingbirdGollan's recommendation.  She verbalizes understanding.

## 2014-03-22 NOTE — Telephone Encounter (Signed)
(  continued from previous message) -Stop furosemide -Start torsemide 40 mg BID -If pt is unable to get to pharmacy today to pick up torsemide, she is to take extra lasix 80 mg this pm -Increase KCL to 40 mg BID  -Call the office every other day -If wt continues to increase, we can add metolazone -stressed to [name] [name] the importance that pt NOT take torsemide and furosemide together She verbalizes understanding and states that pt's son will p/u torsemide rx today.  Advised her that we will be in the process of moving to our new office on Thursday, but the [place] office will be taking our calls and will get the message to Dr. Mariah MillingGollan.

## 2014-03-22 NOTE — Telephone Encounter (Signed)
Potassium up to 20 milliequivalents twice a day Take potassium 20 for  every torsemide 40

## 2014-03-22 NOTE — Telephone Encounter (Signed)
Pt's home health nurse calling w/ pt's INR:  2.3

## 2014-03-22 NOTE — Telephone Encounter (Signed)
Pt called back stating that she has been taking lasix and KCL since her discharge.  Advised pt that these were not on her discharge orders and that we had a discussion about this earlier today that she has NOT been taking a diuretic. She states that she has been taking lasix 80 mg w/ her KCL, but her swelling is increasing and she is now SOB.  Please advise.  Thank you.

## 2014-03-22 NOTE — Telephone Encounter (Signed)
See anticoagulation note 

## 2014-03-22 NOTE — Telephone Encounter (Signed)
Spoke w/ pt.  Advised her of Ryan's recommendation.  She verbalizes understanding, is agreeable and will call back w/ any questions or concerns.

## 2014-03-22 NOTE — Telephone Encounter (Signed)
Patient just got out the hospital for "fluid out of control" yesterday and is swelling again.

## 2014-03-22 NOTE — Telephone Encounter (Signed)
Spoke w/ pt.  She reports that she was released from [facility] after being in the hospital for about a month.  Reports that she was discharged w/o any type of fluid pill and her wt is increasing.  Reports her wt on discharge was 269, today 281. Pt does not have compression hose or ACE bandages, she is staying w/ her son and will "see what he has".  Reports increasing ankle edema and that her fingers are starting to swell. Denies SOB.  States that she was d/c'd on no-salt diet and limited fluids, which she is strictly adhering to.  Reports that she was on Prednisone while hospitalized, diuretics were held during that time, and they were not restarted.  She would like a diuretic sent in as soon as possible.

## 2014-03-22 NOTE — Addendum Note (Signed)
Addended by: Rhea BeltonMOODY, Jaquan Sadowsky R on: 03/22/2014 04:52 PM   Modules accepted: Orders, Medications

## 2014-03-22 NOTE — Telephone Encounter (Signed)
Home Health Nurse called and patient has elevated bp, swelling, sob and oxygen level.

## 2014-03-22 NOTE — Telephone Encounter (Signed)
Please see previous phone note.  

## 2014-03-23 ENCOUNTER — Telehealth: Payer: Self-pay | Admitting: *Deleted

## 2014-03-23 NOTE — Telephone Encounter (Signed)
Spoke with Elnita Maxwellheryl Pharmacist at [facility] and she states that on 03/18/2014 INR 1.99 given coumadin 4mg  , on 03/19/2014 INR 1.69 given Coumadin 5mg  and on 03/20/2014 INR 1.7 and was given coumadin 4mg 

## 2014-03-24 ENCOUNTER — Ambulatory Visit (INDEPENDENT_AMBULATORY_CARE_PROVIDER_SITE_OTHER): Payer: Medicare Other | Admitting: Pharmacist

## 2014-03-24 ENCOUNTER — Telehealth: Payer: Self-pay | Admitting: Cardiovascular Disease

## 2014-03-24 DIAGNOSIS — Z5181 Encounter for therapeutic drug level monitoring: Secondary | ICD-10-CM

## 2014-03-24 DIAGNOSIS — I4891 Unspecified atrial fibrillation: Secondary | ICD-10-CM

## 2014-03-24 LAB — POCT INR: INR: 2.1

## 2014-03-24 NOTE — Telephone Encounter (Signed)
Given that 2 days ago her weight was 281, I would continue current medications. She is improving. No need for metolazone at this time as her weight is not increasing. Continue to limit fluid intake. She must wear compression stockings. Follow up as planned.

## 2014-03-24 NOTE — Telephone Encounter (Signed)
Spoke w/ Frazier ButtLouAnn, pt's home health nurse.  Advised her of Ryan's recommendation.   She will call back if we can be of further assistance.

## 2014-03-24 NOTE — Telephone Encounter (Signed)
Pt's vitals signs and weight 275lbs and bp 140/78, hr 72, oxygen 90 % on 3 liters, breathing pattern 18, swelling down 2% in her feet, to continue same meds or increase

## 2014-03-25 NOTE — Telephone Encounter (Signed)
Would probably follow-up today or Monday with her weight and which medications she is taking

## 2014-03-28 ENCOUNTER — Telehealth: Payer: Self-pay

## 2014-03-28 ENCOUNTER — Telehealth: Payer: Self-pay | Admitting: Pulmonary Disease

## 2014-03-28 NOTE — Telephone Encounter (Signed)
BQ pt. Called and spoke to pt's OT, Ashlam. Ashlam stated the pt's SpO2 was 53% on 3lpm. Ashlam increase pt's O2 to 4lpm and the pt's SpO2 increase to 83%. Per Ashlam the pt's HR is 83, BP is 118/60. Spoke with pt. Pt is completing full sentences without dyspnea. Pt c/o DOE and mild cough after neb treatment and producing white mucus. Pt denies CP/tightness and swelling. Pt was d/c from [facility] for CAP and diastolic HF. Pt is alert and oriented x 4. Pt has lost 12lb since hospital d/c on 10/21. Pt has upcoming appt with BQ on 11/18.   Dr. Vassie LollAlva, please advise.  Allergies  Allergen Reactions  . Prednisone     Eyes swelling Pt is not sure if it was prednisone.

## 2014-03-28 NOTE — Telephone Encounter (Signed)
Called spoke with pt. Made her aware of below. She voiced understanding and needed nothing further

## 2014-03-28 NOTE — Telephone Encounter (Signed)
Spoke w/ pt's home health nurse, Carlisle BeersLuAnn.  Advised her that is fine to draw pt's BMET at home, as she will be drawing pt's PT/INR. Asked her to have results faxed to our office.

## 2014-03-28 NOTE — Telephone Encounter (Signed)
PT HOME HEALTH NURSE CALLED, ASKS IF SHE CAN DO PT BMET IN THE HOME. PT IS SCHEDULED TO COME HERE ON WED 11/18. PLEASE ADVISE

## 2014-03-28 NOTE — Telephone Encounter (Signed)
Keep O2 satn 88& above - Ok to use 4-5 L o2 Will need further eval on OV

## 2014-03-29 ENCOUNTER — Ambulatory Visit (INDEPENDENT_AMBULATORY_CARE_PROVIDER_SITE_OTHER): Payer: Medicare Other | Admitting: Pharmacist

## 2014-03-29 ENCOUNTER — Telehealth: Payer: Self-pay | Admitting: Cardiovascular Disease

## 2014-03-29 ENCOUNTER — Other Ambulatory Visit: Payer: Medicare Other

## 2014-03-29 DIAGNOSIS — I4891 Unspecified atrial fibrillation: Secondary | ICD-10-CM

## 2014-03-29 DIAGNOSIS — Z5181 Encounter for therapeutic drug level monitoring: Secondary | ICD-10-CM

## 2014-03-29 LAB — POCT INR: INR: 1.4

## 2014-03-29 NOTE — Telephone Encounter (Signed)
Pt wanted to know if she could come tmrw and do the lab work. Between 2-4pm

## 2014-03-29 NOTE — Telephone Encounter (Signed)
Attempted to contact pt to let her know that her home health nurse got orders to draw this lab today in her home w/ her PT/INR.  No answer, voicemail box not set up.

## 2014-03-30 ENCOUNTER — Other Ambulatory Visit: Payer: Self-pay

## 2014-03-30 ENCOUNTER — Other Ambulatory Visit: Payer: Medicare Other

## 2014-03-30 ENCOUNTER — Ambulatory Visit (INDEPENDENT_AMBULATORY_CARE_PROVIDER_SITE_OTHER): Payer: Medicare Other | Admitting: Pulmonary Disease

## 2014-03-30 ENCOUNTER — Encounter: Payer: Self-pay | Admitting: Pulmonary Disease

## 2014-03-30 VITALS — BP 116/72 | HR 84 | Ht 64.0 in | Wt 266.5 lb

## 2014-03-30 DIAGNOSIS — J9611 Chronic respiratory failure with hypoxia: Secondary | ICD-10-CM

## 2014-03-30 DIAGNOSIS — R609 Edema, unspecified: Secondary | ICD-10-CM

## 2014-03-30 DIAGNOSIS — I5032 Chronic diastolic (congestive) heart failure: Secondary | ICD-10-CM

## 2014-03-30 DIAGNOSIS — J9612 Chronic respiratory failure with hypercapnia: Secondary | ICD-10-CM

## 2014-03-30 DIAGNOSIS — G4733 Obstructive sleep apnea (adult) (pediatric): Secondary | ICD-10-CM

## 2014-03-30 DIAGNOSIS — R0602 Shortness of breath: Secondary | ICD-10-CM

## 2014-03-30 NOTE — Assessment & Plan Note (Signed)
I question whether or not she has interstitial lung disease or not.  Her CT was inconclusive and was most consistent with volume overload.  I wonder if she could have PVOD.  Plan: -continue 4L continuously -CT chest high resolution now that she has diuresed since hospital discharge

## 2014-03-30 NOTE — Assessment & Plan Note (Signed)
I explained to her today that the only objective problem we've been able to identify and her lungs up until this point has been in pulmonary edema from congestive heart failure. Surprisingly, she did not have pulmonary hypertension on her right heart catheterization which was performed in October 2015.  Plan: -Continue current treatment for congestive heart failure -Now is a good time to get the repeat CT chest to see if there is evidence of pulmonary fibrosis as she says she has lost 15 pounds of fluid since hospital discharge

## 2014-03-30 NOTE — Assessment & Plan Note (Signed)
Continue CPAP each bedtime

## 2014-03-30 NOTE — Progress Notes (Signed)
Subjective:    Patient ID: [name] [name], female    DOB: 07/20/1931, 80 y.o.   MRN: [id number]  Synopsis: The Georgina PillionMassey is a 80 year old female who has a past medical history significant for congestive heart failure, pulmonary hypertension, pulmonary embolism, atrial fibrillation and obstructive sleep apnea. She previously been seen by Dr. Coralyn HellingVineet Sood for management of sleep apnea. She uses CPAP at 7 a 10 cm of water every night. At baseline she is on 2.5 L of oxygen continuously.  HPI  Chief Complaint  Patient presents with  . Follow-up    pt c/o sob with exhertion, patient has some cough with white mucus production, patient denies chest tightnes. She is now on Bi-pap and 02 has been cut to 4L.   03/30/2014 ROV> [name] [name] said that her weight went up some when she first came home but her weight has gone down as much as 14 pounds.  She takes her diuretics twice a day, and it makes her go to the bathroom frequently. She feels like her dyspnea is worsening.  She is not coughing more except when she uses her breathing treatments.  She occasionally brings up white mucus.    Past Medical History  Diagnosis Date  . Congestive heart failure, unspecified     a. ECHO 6/0: EF 55-60%, mild LVH, mildly dilated RV w. mildly dec fx, RVSP 67  . Chronic atrial fibrillation     failed cardioversion in past  -repeat DC-CV on October 5th, 2010  . Personal history of DVT (deep vein thrombosis) 2008  . Obstructive sleep apnea   . HTN (hypertension)   . Morbid obesity   . Shingles     with postherpetic neuraigia  . GERD (gastroesophageal reflux disease)   . Hyperlipidemia   . Meningitis 1950s  . Gallstones     s/p cholecystectomy  . CAD (coronary artery disease)     nonobstructive by cath 8/10 - LAD 40-50% w mild PAH mean 29 w PVR 3.2 [place]  . AAA (abdominal aortic aneurysm)     small  . PE (pulmonary embolism)     history of  . Anemia       Review of Systems  Constitutional: Positive for fatigue.  Negative for fever, chills and diaphoresis.  HENT: Negative for congestion, postnasal drip, rhinorrhea and sinus pressure.   Respiratory: Positive for shortness of breath. Negative for cough, choking, chest tightness and wheezing.   Cardiovascular: Positive for leg swelling. Negative for chest pain.       Objective:   Physical Exam   Filed Vitals:   03/30/14 1410  BP: 116/72  Pulse: 84  Height: 5\' 4"  (1.626 m)  Weight: 266 lb 8 oz (120.884 kg)  SpO2: 88%  2.5L [place]   Gen: chronically ill appearing, no acute distress, obese HEENT: NCAT,  EOMi,  PULM: CTA B CV: Irreg irreg, no mgr, no JVD AB: BS+, soft, nontender, no hsm Ext: warm, massive pitting edema in legs, no clubbing, no cyanosis    12/2012 CXR > Left lung scarring vs pneumonia  10/19 RHC >RA mean 3 RV 37/6 PA 37/15, mean 23 PCWP mean 7 Oxygen saturations: PA 75% AO 92% Cardiac Output(Fick) 8.94 Cardiac Index (Fick) 4.14 Cardiac Output (Thermo) 5.05 Cardiac Index (Thermo) 2.34  02/2014 CT chest high res> mild pulm edema, very mild scattered subpleural reticulation, but assessment limited by likely pulmonary edema; 4mm nodule   Assessment & Plan:   Obstructive sleep apnea Continue CPAP each bedtime  DIASTOLIC HEART  FAILURE, CHRONIC I explained to her today that the only objective problem we've been able to identify and her lungs up until this point has been in pulmonary edema from congestive heart failure. Surprisingly, she did not have pulmonary hypertension on her right heart catheterization which was performed in October 2015.  Plan: -Continue current treatment for congestive heart failure -Now is a good time to get the repeat CT chest to see if there is evidence of pulmonary fibrosis as she says she has lost 15 pounds of fluid since hospital discharge  Chronic hypoxemic respiratory failure I question whether or not she has interstitial lung disease or not.  Her CT was inconclusive and was most consistent with  volume overload.  I wonder if she could have PVOD.  Plan: -continue 4L continuously -CT chest high resolution now that she has diuresed since hospital discharge    Updated Medication List Outpatient Encounter Prescriptions as of 03/30/2014  Medication Sig  . acetaminophen (TYLENOL) 325 MG tablet Take 2 tablets (650 mg total) by mouth every 4 (four) hours as needed for headache or mild pain.  [name]. allopurinol (ZYLOPRIM) 100 MG tablet Take 1 tablet (100 mg total) by mouth daily.  [name]. amiodarone (PACERONE) 200 MG tablet Take 1 tablet (200 mg total) by mouth 2 (two) times daily. Beginning 10/28 take 200 mg daily for 7 days then decrease to 100 mg daily  . atorvastatin (LIPITOR) 10 MG tablet Take 1 tablet (10 mg total) by mouth daily.  . budesonide (PULMICORT) 0.5 MG/2ML nebulizer solution Take 2 mLs (0.5 mg total) by nebulization 2 (two) times daily.  . cetirizine (ZYRTEC ALLERGY) 10 MG tablet Take 10 mg by mouth as needed.   . diclofenac sodium (VOLTAREN) 1 % GEL Apply 2 g topically 4 (four) times daily.  [name]. diltiazem (CARDIZEM CD) 120 MG 24 hr capsule Take 1 capsule (120 mg total) by mouth daily.  [name]. escitalopram (LEXAPRO) 20 MG tablet Take 1 tablet (20 mg total) by mouth daily.  [name]. levalbuterol (XOPENEX) 0.63 MG/3ML nebulizer solution Take 3 mLs (0.63 mg total) by nebulization 4 (four) times daily.  . ondansetron (ZOFRAN) 4 MG/2ML SOLN injection Inject 2 mLs (4 mg total) into the vein every 6 (six) hours as needed for nausea.  . potassium chloride SA (K-DUR,KLOR-CON) 20 MEQ tablet Take 1 tablet (20 mEq total) by mouth daily.  [name]. torsemide (DEMADEX) 20 MG tablet Take 2 tablets (40 mg total) by mouth 2 (two) times daily.  [name]. warfarin (COUMADIN) 2.5 MG tablet Take 1.25-2.5 mg by mouth See admin instructions. 1/2 tablet only on Monday and 1 tablet all the rest of the days  . antiseptic oral rinse (CPC / CETYLPYRIDINIUM CHLORIDE 0.05%) 0.05 % LIQD solution 7 mLs by Mouth Rinse route 2 times daily at 12 noon  and 4 pm.  . chlorhexidine (PERIDEX) 0.12 % solution 15 mLs by Mouth Rinse route 2 (two) times daily.  . colchicine 0.6 MG tablet Take 0.6 mg by mouth as needed.  . Difluprednate (DUREZOL) 0.05 % EMUL Apply 1 drop to eye 2 (two) times daily.  . famotidine (PEPCID) 20 MG tablet Take 20 mg by mouth daily.  . feeding supplement, GLUCERNA SHAKE, (GLUCERNA SHAKE) LIQD Take 237 mLs by mouth 3 (three) times daily between meals.  . flecainide (TAMBOCOR) 100 MG tablet Take 100 mg by mouth 2 (two) times daily.  [name]. gabapentin (NEURONTIN) 300 MG capsule Take 300 mg by mouth daily.  . insulin aspart (NOVOLOG) 100 UNIT/ML injection USE  FACILITY BASED SSI  . insulin aspart (NOVOLOG) 100 UNIT/ML injection USE FACILITY BASED SSI  . methylPREDNISolone sodium succinate (SOLU-MEDROL) 40 mg/mL injection Inject 1 mL (40 mg total) into the vein daily.  . metolazone (ZAROXOLYN) 2.5 MG tablet Take 2.5 mg by mouth daily.  [name] moxifloxacin (VIGAMOX) 0.5 % ophthalmic solution Place 1 drop into the left eye 2 (two) times daily.  . polyethylene glycol (MIRALAX / GLYCOLAX) packet Take 17 g by mouth daily as needed for mild constipation, moderate constipation or severe constipation.  . potassium chloride (K-DUR) 10 MEQ tablet Take 10 mEq by mouth daily.  [name] senna-docusate (SENOKOT-S) 8.6-50 MG per tablet Take 1 tablet by mouth 2 (two) times daily.  . sodium chloride 0.9 % infusion Inject 250 mLs into the vein continuous.  [name] warfarin (COUMADIN) 5 MG tablet Take 5 mg by mouth as directed.

## 2014-03-30 NOTE — Patient Instructions (Signed)
We will arrange the CT chest and will call you with the results We will see you back in 2 weeks, in [place] if necessary, overbook OK

## 2014-03-30 NOTE — Telephone Encounter (Signed)
Pt coming for lab draw today, as "they didn't get enough."

## 2014-03-31 LAB — BASIC METABOLIC PANEL
BUN/Creatinine Ratio: 15 (ref 11–26)
BUN: 17 mg/dL (ref 8–27)
CO2: 26 mmol/L (ref 18–29)
CREATININE: 1.17 mg/dL — AB (ref 0.57–1.00)
Calcium: 9.6 mg/dL (ref 8.7–10.3)
Chloride: 98 mmol/L (ref 97–108)
GFR calc Af Amer: 51 mL/min/{1.73_m2} — ABNORMAL LOW (ref >59)
GFR, EST NON AFRICAN AMERICAN: 44 mL/min/{1.73_m2} — AB (ref >59)
Glucose: 90 mg/dL (ref 65–99)
POTASSIUM: 4.9 mmol/L (ref 3.5–5.2)
SODIUM: 144 mmol/L (ref 134–144)

## 2014-04-01 ENCOUNTER — Ambulatory Visit: Payer: Medicare Other | Admitting: Family Medicine

## 2014-04-04 ENCOUNTER — Ambulatory Visit: Payer: Self-pay | Admitting: Pulmonary Disease

## 2014-04-05 ENCOUNTER — Telehealth: Payer: Self-pay

## 2014-04-05 ENCOUNTER — Encounter: Payer: Self-pay | Admitting: Cardiovascular Disease

## 2014-04-05 ENCOUNTER — Encounter: Payer: Self-pay | Admitting: Pulmonary Disease

## 2014-04-05 ENCOUNTER — Ambulatory Visit (INDEPENDENT_AMBULATORY_CARE_PROVIDER_SITE_OTHER): Payer: Medicare Other | Admitting: Cardiovascular Disease

## 2014-04-05 VITALS — BP 112/64 | HR 84 | Ht 64.0 in | Wt 263.0 lb

## 2014-04-05 DIAGNOSIS — Z7189 Other specified counseling: Secondary | ICD-10-CM | POA: Insufficient documentation

## 2014-04-05 DIAGNOSIS — J849 Interstitial pulmonary disease, unspecified: Secondary | ICD-10-CM

## 2014-04-05 DIAGNOSIS — I5033 Acute on chronic diastolic (congestive) heart failure: Secondary | ICD-10-CM

## 2014-04-05 DIAGNOSIS — I10 Essential (primary) hypertension: Secondary | ICD-10-CM

## 2014-04-05 DIAGNOSIS — I4891 Unspecified atrial fibrillation: Secondary | ICD-10-CM

## 2014-04-05 DIAGNOSIS — R0602 Shortness of breath: Secondary | ICD-10-CM

## 2014-04-05 NOTE — Assessment & Plan Note (Signed)
We have encouraged continued exercise, careful diet management in an effort to lose weight. 

## 2014-04-05 NOTE — Assessment & Plan Note (Signed)
Blood pressure is well controlled on today's visit. No changes made to the medications. 

## 2014-04-05 NOTE — Progress Notes (Signed)
Patient ID: [name] [name], female    DOB: 12/25/1931, 80 y.o.   MRN: [id number]  HPI Comments: 80 y/o woman with h/o morbid obesity, sleep apnea and obesity hypoventilation syndrome, atrial fib maintaining NSR on flecainide, DVT on the left and PE in 2007, diastolic HF on chronic diuretics, nonobstructive CAD by cath 8/10 and small AAA, chronic shortness of breath, edema.  She presents for routine followup of her diastolic CHF  She had recent hospitalization 02/18/2014 with 2 weeks in the hospital, then 2 weeks in rehabilitation, discharge 03/21/2014. Initial echocardiogram on arrival to the hospital showed severe pulmonary hypertension. She had aggressive diuresis for 5 days then right heart catheterization showing essentially normal right heart pressures. She was discharged to rehabilitation with weight 255 pounds, down from 267 pounds per the patient. \On discharge from the rehabilitation, she was 281 pounds. She was not on a diuretic through the 2 weeks. She call our office after discharge with worsening lower extremity edema and shortness of breath. She was started on torsemide 40 g twice a day. Weight is now 263 pounds at home. Leg edema has resolved, she feels well overall with minimal shortness of breath. She states that weight has been stable on this torsemide 40 mg twice a day dosing. Recent basic metabolic panel reviewed with her showing potassium 4.9, essentially normal renal function  EKG shows normal sinus rhythm with rate 84 bpm, no significant ST or T-wave changes   she continues to have wound healing issues of her right lower extremity . This is slowly getting better  Other past medical history  in the hospital 08/31/2013. heart failure symptoms, had aggressive diuresis in the hospital with 15 pound weight drop.  Over the past several months her creatinine has slowly climbed from her baseline 1.2, up to 1.6, 1.8   Previous episode of syncope/loss of consciousness in February  2014 on Valentine's Day. Creatinine at that time was 1.7 .She was given IV fluids. secondary to a huge hematoma of her right leg, she has had months of wound evaluation. Not a candidate for skin grafting. She has a leg wrap on the right. No swelling of her left lower extremity, only the right. Region of skin loss on the right is extensive. discharge 07/27/2012   hospitalization 07/14/2013 for epistaxis. INR was 3.7. She had her nose packed and cauterized. Cardiology evaluated the patient on 06/17/2013. Flecainide and diltiazem was continued. She was discharged on 06/18/2013 Lab work in the hospital 06/16/2013 showed creatinine 1.84, BUN 60, potassium 3.1  Echocardiogram in the hospital 06/27/2012 was essentially normal, mildly dilated left atrium Echocardiogram October 2015 with normal ejection fraction, severe pulmonary hypertension   Outpatient Encounter Prescriptions as of 04/05/2014  Medication Sig  . acetaminophen (TYLENOL) 325 MG tablet Take 2 tablets (650 mg total) by mouth every 4 (four) hours as needed for headache or mild pain.  [name]. allopurinol (ZYLOPRIM) 100 MG tablet Take 1 tablet (100 mg total) by mouth daily.  [name]. amiodarone (PACERONE) 100 MG tablet Take 100 mg by mouth daily.  [name]. atorvastatin (LIPITOR) 10 MG tablet Take 1 tablet (10 mg total) by mouth daily.  . budesonide (PULMICORT) 0.5 MG/2ML nebulizer solution Take 2 mLs (0.5 mg total) by nebulization 2 (two) times daily.  . cetirizine (ZYRTEC ALLERGY) 10 MG tablet Take 10 mg by mouth as needed.   . colchicine 0.6 MG tablet Take 0.6 mg by mouth as needed.  . diclofenac sodium (VOLTAREN) 1 % GEL Apply 2 g topically  4 (four) times daily.  [name]. diltiazem (CARDIZEM CD) 120 MG 24 hr capsule Take 1 capsule (120 mg total) by mouth daily.  [name]. escitalopram (LEXAPRO) 20 MG tablet Take 1 tablet (20 mg total) by mouth daily.  . famotidine (PEPCID) 20 MG tablet Take 20 mg by mouth daily.  [name]. gabapentin (NEURONTIN) 300 MG capsule Take 300 mg by  mouth daily.  [name]. levalbuterol (XOPENEX) 0.63 MG/3ML nebulizer solution Take 3 mLs (0.63 mg total) by nebulization 4 (four) times daily.  . potassium chloride SA (K-DUR,KLOR-CON) 20 MEQ tablet Take 1 tablet (20 mEq total) by mouth daily. (Patient taking differently: Take 40 mEq by mouth 2 (two) times daily. )  . torsemide (DEMADEX) 20 MG tablet Take 2 tablets (40 mg total) by mouth 2 (two) times daily.  [name]. warfarin (COUMADIN) 2.5 MG tablet Take 1.25-2.5 mg by mouth See admin instructions. 1/2 tablet only on Monday and 1 tablet all the rest of the days  . [DISCONTINUED] amiodarone (PACERONE) 200 MG tablet Take 1 tablet (200 mg total) by mouth 2 (two) times daily. Beginning 10/28 take 200 mg daily for 7 days then decrease to 100 mg daily (Patient not taking: Reported on 04/05/2014)  . [DISCONTINUED] antiseptic oral rinse (CPC / CETYLPYRIDINIUM CHLORIDE 0.05%) 0.05 % LIQD solution 7 mLs by Mouth Rinse route 2 times daily at 12 noon and 4 pm. (Patient not taking: Reported on 04/05/2014)  . [DISCONTINUED] chlorhexidine (PERIDEX) 0.12 % solution 15 mLs by Mouth Rinse route 2 (two) times daily. (Patient not taking: Reported on 04/05/2014)  . [DISCONTINUED] Difluprednate (DUREZOL) 0.05 % EMUL Apply 1 drop to eye 2 (two) times daily.  . [DISCONTINUED] feeding supplement, GLUCERNA SHAKE, (GLUCERNA SHAKE) LIQD Take 237 mLs by mouth 3 (three) times daily between meals. (Patient not taking: Reported on 04/05/2014)  . [DISCONTINUED] flecainide (TAMBOCOR) 100 MG tablet Take 100 mg by mouth 2 (two) times daily.  . [DISCONTINUED] insulin aspart (NOVOLOG) 100 UNIT/ML injection USE FACILITY BASED SSI (Patient not taking: Reported on 04/05/2014)  . [DISCONTINUED] insulin aspart (NOVOLOG) 100 UNIT/ML injection USE FACILITY BASED SSI (Patient not taking: Reported on 04/05/2014)  . [DISCONTINUED] methylPREDNISolone sodium succinate (SOLU-MEDROL) 40 mg/mL injection Inject 1 mL (40 mg total) into the vein daily. (Patient not  taking: Reported on 04/05/2014)  . [DISCONTINUED] metolazone (ZAROXOLYN) 2.5 MG tablet Take 2.5 mg by mouth daily.  . [DISCONTINUED] moxifloxacin (VIGAMOX) 0.5 % ophthalmic solution Place 1 drop into the left eye 2 (two) times daily.  . [DISCONTINUED] ondansetron (ZOFRAN) 4 MG/2ML SOLN injection Inject 2 mLs (4 mg total) into the vein every 6 (six) hours as needed for nausea. (Patient not taking: Reported on 04/05/2014)  . [DISCONTINUED] polyethylene glycol (MIRALAX / GLYCOLAX) packet Take 17 g by mouth daily as needed for mild constipation, moderate constipation or severe constipation. (Patient not taking: Reported on 04/05/2014)  . [DISCONTINUED] potassium chloride (K-DUR) 10 MEQ tablet Take 10 mEq by mouth daily.  . [DISCONTINUED] senna-docusate (SENOKOT-S) 8.6-50 MG per tablet Take 1 tablet by mouth 2 (two) times daily. (Patient not taking: Reported on 04/05/2014)  . [DISCONTINUED] sodium chloride 0.9 % infusion Inject 250 mLs into the vein continuous. (Patient not taking: Reported on 04/05/2014)  . [DISCONTINUED] warfarin (COUMADIN) 5 MG tablet Take 5 mg by mouth as directed.   Social history  reports that she has never smoked. She has never used smokeless tobacco. She reports that she drinks alcohol. She reports that she does not use illicit drugs.  Review of Systems  Constitutional: Negative.   Respiratory: Positive for shortness of breath.   Cardiovascular: Negative.   Gastrointestinal: Negative.   Musculoskeletal: Positive for gait problem.  Skin: Positive for wound.       Right lower extremity, bandage in place  Neurological: Negative.   Hematological: Negative.   Psychiatric/Behavioral: Negative.   All other systems reviewed and are negative.   BP 112/64 mmHg  Pulse 84  Ht 5\' 4"  (1.626 m)  Wt 263 lb (119.296 kg)  BMI 45.12 kg/m2  Physical Exam  Constitutional: She is oriented to person, place, and time. She appears well-developed and well-nourished.  Obese  HENT:   Head: Normocephalic.  Nose: Nose normal.  Mouth/Throat: Oropharynx is clear and moist.  Eyes: Conjunctivae are normal. Pupils are equal, round, and reactive to light.  Neck: Normal range of motion. Neck supple. No JVD present.  Cardiovascular: Normal rate, regular rhythm, S1 normal, S2 normal, normal heart sounds and intact distal pulses.  Exam reveals no gallop and no friction rub.   No murmur heard. Pulmonary/Chest: Effort normal and breath sounds normal. No respiratory distress. She has no wheezes. She has no rales. She exhibits no tenderness.  Abdominal: Soft. Bowel sounds are normal. She exhibits no distension. There is no tenderness.  Musculoskeletal: Normal range of motion. She exhibits no edema or tenderness.  Lymphadenopathy:    She has no cervical adenopathy.  Neurological: She is alert and oriented to person, place, and time. Coordination normal.  Skin: Skin is warm and dry. No rash noted. No erythema.  Psychiatric: She has a normal mood and affect. Her behavior is normal. Judgment and thought content normal.    Assessment and Plan  Nursing note and vitals reviewed.

## 2014-04-05 NOTE — Assessment & Plan Note (Signed)
Recommended that she stay on her torsemide 40 mg twice a day Goal weight 260-265 pounds Recent basic metabolic panel within normal range

## 2014-04-05 NOTE — Telephone Encounter (Signed)
ATC X1 to relay results and recs, pt does not have voicemail set up.  Also, pt needs rov scheduled for next week in [place].  wcb

## 2014-04-05 NOTE — Telephone Encounter (Signed)
-----   Message from Lupita Leashouglas B McQuaid, MD sent at 04/05/2014  9:53 AM EST ----- A, PLease let her know that her CT chest showed some scarring consistent with fibrosis.  She needs to have my ILD panel of labs drawn prior to the next visit.  Thanks B

## 2014-04-05 NOTE — Assessment & Plan Note (Signed)
We have discussed the various options for anticoagulation with her. She has indicated she would like to start a NOAC. Suggested she check the price of Xarelto and Eliquis. Samples of Eliquis were provided to her today in preparation of coming off warfarin

## 2014-04-05 NOTE — Assessment & Plan Note (Signed)
Maintaining normal sinus rhythm. Currently on warfarin monitored by primary care. She is interested in trying a NOAC. Samples of eliquis were given to her today but she was instructed not to start this until she checks the price and we know her INR.

## 2014-04-05 NOTE — Patient Instructions (Signed)
You are doing well. No medication changes were made.  Please take less potassium (3 pills) several days a week  Goal weight is 260 to 265 pounds  Check the price of xarelto 20 mg once a day, and eliquis 5 mg twice a day  Please call us if you have new issues that need to be addressed before your next appt.  Your physician wants you to follow-up in: 3 months.  You will receive a reminder letter in the mail two months in advance. If you don't receive a letter, please call our office to schedule the follow-up appointment.

## 2014-04-06 ENCOUNTER — Telehealth: Payer: Self-pay

## 2014-04-06 NOTE — Telephone Encounter (Signed)
Larita FifeLynn nurse with [facility] left v/m PT 16.9 and INR was 1.4. Pt is presently taking 2.5 mg of coumadin daily; Dr Patsy Lageropland said for pt to continue taking Coumadin 2.5 mg daily except on Thur pt should take 3.75 mg. Recheck PT/INR in one month. [facility] but left detailed message and spoke with pt and advised above instructions and pt voiced understanding of how to take med and when to have next PT/INR.

## 2014-04-07 NOTE — Telephone Encounter (Signed)
Essentially, added additional 1.25 mg taken the next day, then increased to 2.5 mg daily, except Thursday, which will be 3.75 mg

## 2014-04-12 ENCOUNTER — Encounter: Payer: Self-pay | Admitting: Family Medicine

## 2014-04-12 ENCOUNTER — Ambulatory Visit (INDEPENDENT_AMBULATORY_CARE_PROVIDER_SITE_OTHER): Payer: Medicare Other | Admitting: Family Medicine

## 2014-04-12 VITALS — BP 130/80 | HR 78 | Temp 97.9°F | Ht 64.0 in | Wt 262.0 lb

## 2014-04-12 DIAGNOSIS — M109 Gout, unspecified: Secondary | ICD-10-CM

## 2014-04-12 DIAGNOSIS — E7800 Pure hypercholesterolemia, unspecified: Secondary | ICD-10-CM

## 2014-04-12 DIAGNOSIS — Z23 Encounter for immunization: Secondary | ICD-10-CM

## 2014-04-12 DIAGNOSIS — Z0000 Encounter for general adult medical examination without abnormal findings: Secondary | ICD-10-CM

## 2014-04-12 DIAGNOSIS — I10 Essential (primary) hypertension: Secondary | ICD-10-CM

## 2014-04-12 DIAGNOSIS — Z7189 Other specified counseling: Secondary | ICD-10-CM

## 2014-04-12 NOTE — Progress Notes (Signed)
Pre visit review using our clinic review tool, if applicable. No additional management support is needed unless otherwise documented below in the visit note. 

## 2014-04-12 NOTE — Assessment & Plan Note (Signed)
Elevated uric acid. Currently stopped allopurinol. Treat current flare with colchicine, when over restart allopurinol at higher dose if able or consider uloric given kidney function.

## 2014-04-12 NOTE — Addendum Note (Signed)
Addended by: Damita LackLORING, Halle Davlin S on: 04/12/2014 03:31 PM   Modules accepted: Orders

## 2014-04-12 NOTE — Assessment & Plan Note (Signed)
Well controlled. Continue current medication.  

## 2014-04-12 NOTE — Patient Instructions (Addendum)
Continue colchine until the flare is over, then call to get back on allopurinol at higher dose. Repeat uric acid level 4 weeks after change in med to check to see if its working. Work on [employer] eating. Follow up in 3 months 30 min OV.

## 2014-04-12 NOTE — Assessment & Plan Note (Signed)
Excellent control.   

## 2014-04-12 NOTE — Progress Notes (Signed)
80 y/o woman with h/o morbid obesity, sleep apnea and obesity hypoventilation syndrome, atrial fib maintaining NSR on flecainide, DVT on the left and PE in 2007, diastolic HF on chronic diuretics, nonobstructive CAD by cath 8/10 and small AAA, chronic shortness of breath, edema.  She has complicated medical history with recent frequent hospitalizations.  She had recent hospitalization 02/18/2014 with 2 weeks in the hospital, then 2 weeks in rehabilitation, discharge 03/21/2014. Initial echocardiogram on arrival to the hospital showed severe pulmonary hypertension. She had aggressive diuresis for 5 days then right heart catheterization showing essentially normal right heart pressures. She was discharged to rehabilitation with weight 255 pounds, down from 267 pounds per the patient. \On discharge from the rehabilitation, she was 281 pounds. She was not on a diuretic through the 2 weeks. She called cardiology after discharge with worsening lower extremity edema and shortness of breath. She was started on torsemide 40 g twice a day.  Leg edema has resolved, she feels well overall with minimal shortness of breath. She states that weight has been stable on this torsemide 40 mg twice a day dosing. Recent basic metabolic panel reviewed with her showing potassium 4.9, essentially normal renal function.   She had recent follow up with Dr. Mariah MillingGollan on 04/05/2014.   Wt Readings from Last 3 Encounters:  04/12/14 262 lb (118.842 kg)  04/05/14 263 lb (119.296 kg)  03/30/14 266 lb 8 oz (120.884 kg)     I have personally reviewed the Medicare Annual Wellness questionnaire and have noted 1.The patient's medical and social history 2.Their use of alcohol, tobacco or illicit drugs 3.Their current medications and supplements 4.The patient's functional ability including ADL's, fall risks, home safety risks and hearing or visual  impairment. 5.Diet and  physical activities 6.Evidence for depression or mood disorders  The patients weight, height, BMI and visual acuity have been recorded in the chart I have made referrals, counseling and provided education to the patient based review of the above and I have provided the pt with a written personalized care plan for preventive services.  She continues to require oxygen at night with CPAP and intermittantly during the day. Her O2 is 85% off oxygfen in office today.  Pain from PHN is fairly well controlled on gabapentin. No longer on narcotics.  Hypertension:  BP Readings from Last 3 Encounters:  04/12/14 130/80  04/05/14 112/64  03/30/14 116/72  Using medication without problems or lightheadedness: None Chest pain with exertion:None Edema:None Short of breath:stable Average home BPs:Not checking Other issues:  Elevated Cholesterol: At goal LDL <100 on li[ptior 10 mg daily. Lab Results  Component Value Date   CHOL 115 03/03/2014   HDL 49 03/03/2014   LDLCALC 44 03/03/2014   TRIG 110 03/03/2014   CHOLHDL 2.3 03/03/2014  Diet: good Exercise Moving more Other complaints:  Gout, on allopurinol daily  Last uric acid 13 on 02/2014  She is currently having a gout flare in right wrist. Started cochicine yesterday and ahs had improvement.  Depression: Stable control off any med.    Review of Systems  Constitutional: Positive for fatigue. Negative for fever and unexpected weight change.  HENT: Negative for ear pain, congestion, sore throat, sneezing, trouble swallowing and sinus pressure.  Eyes: Negative for pain and itching.  Respiratory: Negative for cough, shortness of breath and wheezing.  Cardiovascular: Negative for chest pain, palpitations and leg swelling.  Gastrointestinal: Negative for nausea, abdominal pain, diarrhea, constipation and blood in stool.  Genitourinary: Negative for dysuria, hematuria, vaginal  discharge, difficulty urinating and menstrual  problem.  Musculoskeletal: Positive for arthralgias.  Skin: Negative for rash.  Neurological: Negative for syncope, weakness, light-headedness, numbness and headaches.  Psychiatric/Behavioral: Negative for confusion and dysphoric mood. The patient is not nervous/anxious.       Objective:   Physical Exam  Constitutional: Vital signs are normal. She appears well-developed and well-nourished. She is cooperative. Non-toxic appearance. She does not appear ill. No distress.   Morbidly obese, slow gait to due obesity  HENT:  Head: Normocephalic.  Right Ear: Hearing, tympanic membrane, external ear and ear canal normal. Tympanic membrane is not erythematous, not retracted and not bulging.  Left Ear: Hearing, tympanic membrane, external ear and ear canal normal. Tympanic membrane is not erythematous, not retracted and not bulging.  Nose: No mucosal edema or rhinorrhea. Right sinus exhibits no maxillary sinus tenderness and no frontal sinus tenderness. Left sinus exhibits no maxillary sinus tenderness and no frontal sinus tenderness.  Mouth/Throat: Uvula is midline, oropharynx is clear and moist and mucous membranes are normal.  Eyes: Conjunctivae normal, EOM and lids are normal. Pupils are equal, round, and reactive to light. No foreign bodies found.  Neck: Trachea normal and normal range of motion. Neck supple. Carotid bruit is not present. No mass and no thyromegaly present.  Cardiovascular: Normal rate, S1 normal, S2 normal, normal heart sounds, intact distal pulses and normal pulses. An irregular rhythm present. Exam reveals no gallop and no friction rub.  No murmur heard.  1 plus peripheral edema, but this is really good for her, chronic venous stasis changes in skin in lower legs  Pulmonary/Chest: Effort normal and breath sounds normal. Not tachypneic. No respiratory distress. She has no decreased breath sounds. She has no wheezes. She has no rhonchi. She has no rales.   Abdominal: Soft. Normal appearance and bowel sounds are normal. There is no tenderness.  Neurological: She is alert.  Skin: Skin is warm, dry and intact. No rash noted.  Psychiatric: Her speech is normal and behavior is normal. Judgment and thought content normal. Her mood appears not anxious. Cognition and memory are normal. She does not exhibit a depressed mood.          Assessment & Plan:  The patient's preventative maintenance and recommended screening tests for an annual wellness exam were reviewed in full today. Brought up to date unless services declined.  Counselled on the importance of diet, exercise, and its role in overall health and mortality. The patient's FH and SH was reviewed, including their home life, tobacco status, and drug and alcohol status.    Vaccine: Due for prevnar, uptodate otherwise. Mammogram: Not interested. Colon: no longer indicated due to age. DVE/pap: not indicated. No family history of uterine or ovarian cancer. Bone density: Not interested.

## 2014-04-13 ENCOUNTER — Other Ambulatory Visit: Payer: Self-pay | Admitting: *Deleted

## 2014-04-13 MED ORDER — AMIODARONE HCL 100 MG PO TABS: 100.0000 mg | ORAL_TABLET | Freq: Every day | ORAL | Status: DC

## 2014-04-14 ENCOUNTER — Ambulatory Visit (INDEPENDENT_AMBULATORY_CARE_PROVIDER_SITE_OTHER): Payer: Medicare Other | Admitting: Cardiology

## 2014-04-14 DIAGNOSIS — I4891 Unspecified atrial fibrillation: Secondary | ICD-10-CM

## 2014-04-14 DIAGNOSIS — Z5181 Encounter for therapeutic drug level monitoring: Secondary | ICD-10-CM

## 2014-04-14 LAB — POCT INR: INR: 1.4

## 2014-04-14 NOTE — Telephone Encounter (Signed)
Tiffany from anti coag cardiology called pt's INR still 1.4 and will have pts INR repeated in 2 weeks and [facility]. See 04/14/14 anti coag visit note for dosages.

## 2014-04-15 ENCOUNTER — Other Ambulatory Visit: Payer: Self-pay

## 2014-04-15 MED ORDER — FAMOTIDINE 20 MG PO TABS: 20.0000 mg | ORAL_TABLET | Freq: Every day | ORAL | Status: DC

## 2014-04-15 NOTE — Telephone Encounter (Signed)
Refill sent for pepcid

## 2014-04-15 NOTE — Telephone Encounter (Signed)
Noted. review anticoag note. Will follow.

## 2014-04-15 NOTE — Telephone Encounter (Signed)
Refill famotidien 30 mg

## 2014-04-18 ENCOUNTER — Other Ambulatory Visit: Payer: Self-pay | Admitting: *Deleted

## 2014-04-18 NOTE — Telephone Encounter (Signed)
Received faxed refill request from pharmacy. See allergy/contraindiction regarding budesonide. Refill request also for Albuterol soln, 225 cc, inhale one vial in nebulizer three times a day for 30 days. Albuterol is not on medication list. Last office visit 04/12/14. Is it okay to refill medications?

## 2014-04-19 MED ORDER — GABAPENTIN 300 MG PO CAPS: 300.0000 mg | ORAL_CAPSULE | Freq: Every day | ORAL | Status: DC

## 2014-04-19 MED ORDER — BUDESONIDE 0.5 MG/2ML IN SUSP: 0.5000 mg | Freq: Two times a day (BID) | RESPIRATORY_TRACT | Status: DC

## 2014-04-19 NOTE — Telephone Encounter (Signed)
Lmtcb, pt does not have vm set up.  wcb

## 2014-04-21 ENCOUNTER — Encounter (HOSPITAL_COMMUNITY): Payer: Self-pay | Admitting: Cardiology

## 2014-04-21 ENCOUNTER — Telehealth: Payer: Self-pay

## 2014-04-21 NOTE — Telephone Encounter (Signed)
[facility] calling regarding orders that were faxed on 12/2. Please call and advise.

## 2014-04-21 NOTE — Telephone Encounter (Signed)
lmtcb for pt.  

## 2014-04-22 NOTE — Telephone Encounter (Signed)
Paperwork signed by Dr. Mariah MillingGollan and [facility] to [facility].

## 2014-04-22 NOTE — Telephone Encounter (Signed)
Patient returning call.  [phone]

## 2014-04-22 NOTE — Telephone Encounter (Signed)
Pt aware she needs labs before appt scheduled on 05/17/14 and aware of ct chest results.  ILD lab panel placed for [place] draw.  Nothing further needed.

## 2014-04-25 ENCOUNTER — Telehealth: Payer: Self-pay | Admitting: Family Medicine

## 2014-04-25 NOTE — Telephone Encounter (Signed)
Spoke with [facility]Lynn [facility].  She is out seeing Ms. [name] [name] today for her PT/INR which was 17.9/1.5.  She states Ms. [name] [name] is complaining of a sore throat, sinus drainage, productive cough with clear phelgm, no fever and her lungs are clear.  Patient does not wish to come to office for an appointment.  Advised that I would send [name] a phone note but she would not be back in clinic until tomorrow.  Advised if Ms. Manzella's symptoms become worse, she needs to call us back.

## 2014-04-25 NOTE — Telephone Encounter (Signed)
[name] [name] called in to give PT INR Results.

## 2014-04-25 NOTE — Telephone Encounter (Deleted)
[facility] the pt was 17.9 INR was 1.5.

## 2014-04-26 ENCOUNTER — Encounter: Payer: Self-pay | Admitting: Family Medicine

## 2014-04-26 ENCOUNTER — Ambulatory Visit (INDEPENDENT_AMBULATORY_CARE_PROVIDER_SITE_OTHER): Payer: Medicare Other | Admitting: Family Medicine

## 2014-04-26 VITALS — BP 119/85 | HR 71 | Temp 98.0°F | Ht 64.0 in

## 2014-04-26 DIAGNOSIS — J849 Interstitial pulmonary disease, unspecified: Secondary | ICD-10-CM

## 2014-04-26 DIAGNOSIS — Z7189 Other specified counseling: Secondary | ICD-10-CM

## 2014-04-26 DIAGNOSIS — J209 Acute bronchitis, unspecified: Secondary | ICD-10-CM | POA: Insufficient documentation

## 2014-04-26 DIAGNOSIS — M1A00X0 Idiopathic chronic gout, unspecified site, without tophus (tophi): Secondary | ICD-10-CM

## 2014-04-26 MED ORDER — ALLOPURINOL 100 MG PO TABS: 200.0000 mg | ORAL_TABLET | Freq: Every day | ORAL | Status: DC

## 2014-04-26 MED ORDER — DOXYCYCLINE HYCLATE 100 MG PO TABS: 100.0000 mg | ORAL_TABLET | Freq: Two times a day (BID) | ORAL | Status: DC

## 2014-04-26 NOTE — Telephone Encounter (Signed)
Please call to get more... how long has she had symptoms, any shortness pf breath?   It sounds like viral URI... Could use mucinex DM and nasal saline irrigation in that case.

## 2014-04-26 NOTE — Telephone Encounter (Signed)
[name] [name] is actually on our schedule for today at 2:30 pm.

## 2014-04-26 NOTE — Telephone Encounter (Signed)
Left message for [name] that we are going to leave [name] [name]'s coumadin dose the same but we did start her on antibiotics today so verbal order was given to recheck her  PT/INR in one week.

## 2014-04-26 NOTE — Patient Instructions (Addendum)
Can use mucinex DM for cough and congestion.  Start and complete antibiotics. Stay on same dose coumadin. Have [name] check INR in 1 week.  Increase allopurinol to 200 mg daily. Make lab only visit for uric acid in 3-4 weeks.

## 2014-04-26 NOTE — Assessment & Plan Note (Signed)
Given starting antibiotics and increasing allopurinol. Stay on same dose of coumadin as INR is low. Recheck in 1 week.

## 2014-04-26 NOTE — Progress Notes (Signed)
Pre visit review using our clinic review tool, if applicable. No additional management support is needed unless otherwise documented below in the visit note. 

## 2014-04-26 NOTE — Assessment & Plan Note (Signed)
Increase allopurinol to ric acid in mg daily. Recheck  In 3-4 weeks.

## 2014-04-26 NOTE — Assessment & Plan Note (Signed)
>   2 weeks of symptoms in a very fragile pt with recent PNA.  Will treat with mucinex and doxy x 10 days.

## 2014-04-26 NOTE — Addendum Note (Signed)
Addended by: Alvina ChouWALSH, TERRI J on: 04/26/2014 03:40 PM   Modules accepted: Orders

## 2014-04-26 NOTE — Progress Notes (Signed)
   Subjective:    Patient ID: [name] [name], female    DOB: 03/18/1932, 80 y.o.   MRN: [id number]  Cough This is a new problem. The current episode started 1 to 4 weeks ago (2 weeks). The problem has been gradually worsening. The problem occurs constantly. The cough is productive of sputum and productive of purulent sputum. Associated symptoms include headaches, nasal congestion, postnasal drip and a sore throat. Pertinent negatives include no chest pain, chills, ear congestion, ear pain, fever or shortness of breath. Associated symptoms comments: Harder to get a deep breath " I don't  Feel gfood". Exacerbated by: not keeping her up at night. interstitial lung disease   In 02/2014 with issues as well as PNA.  INR today 1.5.   Gout flare is over.  Nml kidney function at last check.  She is ready to increase allopurinol.   Review of Systems  Constitutional: Negative for fever and chills.  HENT: Positive for postnasal drip and sore throat. Negative for ear pain.   Respiratory: Positive for cough. Negative for shortness of breath.   Cardiovascular: Negative for chest pain.  Neurological: Positive for headaches.       Objective:   Physical Exam  Constitutional: Vital signs are normal. She appears well-developed and well-nourished. She is cooperative.  Non-toxic appearance. She does not appear ill. No distress.  Morbidly obese  HENT:  Head: Normocephalic.  Right Ear: Hearing, tympanic membrane, external ear and ear canal normal. Tympanic membrane is not erythematous, not retracted and not bulging.  Left Ear: Hearing, tympanic membrane, external ear and ear canal normal. Tympanic membrane is not erythematous, not retracted and not bulging.  Nose: Mucosal edema and rhinorrhea present. Right sinus exhibits no maxillary sinus tenderness and no frontal sinus tenderness. Left sinus exhibits no maxillary sinus tenderness and no frontal sinus tenderness.  Mouth/Throat: Uvula is midline,  oropharynx is clear and moist and mucous membranes are normal.  Eyes: Conjunctivae, EOM and lids are normal. Pupils are equal, round, and reactive to light. Lids are everted and swept, no foreign bodies found.  Neck: Trachea normal and normal range of motion. Neck supple. Carotid bruit is not present. No thyroid mass and no thyromegaly present.  Cardiovascular: Normal rate, regular rhythm, S1 normal, S2 normal, normal heart sounds, intact distal pulses and normal pulses.  Exam reveals no gallop and no friction rub.   No murmur heard. no edema, wrinkled skin on ankles  Pulmonary/Chest: Effort normal and breath sounds normal. No tachypnea. No respiratory distress. She has no decreased breath sounds. She has no wheezes. She has no rhonchi. She has no rales.  Abdominal: Soft. Normal appearance and bowel sounds are normal. There is no tenderness.  Neurological: She is alert.  Skin: Skin is warm, dry and intact. No rash noted.  Right leg wound healed  Psychiatric: Her speech is normal and behavior is normal. Judgment and thought content normal. Her mood appears not anxious. Cognition and memory are normal. She does not exhibit a depressed mood.   On nasal O2. In wheelchair       Assessment & Plan:

## 2014-04-27 LAB — ANTI-JO 1 ANTIBODY, IGG: Anti JO-1: <0.2 AI (ref 0.0–0.9)

## 2014-04-27 LAB — CENTROMERE ANTIBODIES: Centromere Ab Screen: <1

## 2014-04-27 LAB — SJOGRENS SYNDROME-A EXTRACTABLE NUCLEAR ANTIBODY
SSA (RO) (ENA) ANTIBODY, IGG: NEGATIVE
SSA (Ro) (ENA) Antibody, IgG: <1

## 2014-04-27 LAB — ANTI-SCLERODERMA ANTIBODY: Scleroderma (Scl-70) (ENA) Antibody, IgG: <1

## 2014-04-27 LAB — ANTI-NUCLEAR AB-TITER (ANA TITER): ANA Titer 1: NEGATIVE

## 2014-04-27 LAB — SEDIMENTATION RATE: Sed Rate: 90 mm/hr — ABNORMAL HIGH (ref 0–22)

## 2014-04-27 LAB — SJOGRENS SYNDROME-B EXTRACTABLE NUCLEAR ANTIBODY: SSB (La) (ENA) Antibody, IgG: 3.3 — ABNORMAL HIGH

## 2014-04-27 LAB — RHEUMATOID FACTOR: Rhuematoid fact SerPl-aCnc: <10 IU/mL (ref <=14)

## 2014-04-27 LAB — C-REACTIVE PROTEIN: CRP: 5.9 mg/dL (ref 0.5–20.0)

## 2014-04-27 LAB — ANA: ANA: POSITIVE — AB

## 2014-04-28 LAB — ALDOLASE: ALDOLASE: 4.9 U/L (ref <=8.1)

## 2014-04-29 NOTE — Progress Notes (Signed)
Quick Note:  Pt aware of results and recs ______ 

## 2014-05-02 ENCOUNTER — Ambulatory Visit (INDEPENDENT_AMBULATORY_CARE_PROVIDER_SITE_OTHER): Payer: Medicare Other | Admitting: Family Medicine

## 2014-05-02 ENCOUNTER — Other Ambulatory Visit: Payer: Self-pay | Admitting: *Deleted

## 2014-05-02 ENCOUNTER — Telehealth: Payer: Self-pay | Admitting: Family Medicine

## 2014-05-02 DIAGNOSIS — Z5181 Encounter for therapeutic drug level monitoring: Secondary | ICD-10-CM

## 2014-05-02 DIAGNOSIS — I4891 Unspecified atrial fibrillation: Secondary | ICD-10-CM

## 2014-05-02 LAB — HYPERSENSITIVITY PNUEMONITIS PROFILE

## 2014-05-02 LAB — POCT INR: INR: 1.4

## 2014-05-02 MED ORDER — ESCITALOPRAM OXALATE 20 MG PO TABS: 20.0000 mg | ORAL_TABLET | Freq: Every day | ORAL | Status: DC

## 2014-05-02 NOTE — Telephone Encounter (Signed)
[name] [name], [name] has been working with pt for about a month and making progress but not met goals would. He would like to continue working with her 2 times a week for 3 more weeks. Please advise.

## 2014-05-03 NOTE — Telephone Encounter (Signed)
Agree, given verbal [facility].

## 2014-05-03 NOTE — Telephone Encounter (Signed)
Verbal [facility] given to [facility] with [facility], to continue home PT 2 x a week for 3 more weeks.

## 2014-05-10 ENCOUNTER — Telehealth: Payer: Self-pay | Admitting: Cardiovascular Disease

## 2014-05-10 NOTE — Telephone Encounter (Signed)
Attempted to call [name] [name] back at number provided to let her know that we do not have paperwork from 04/21/14. [facility] they do not have any one by that name that works there.

## 2014-05-10 NOTE — Telephone Encounter (Signed)
Fax: [phone]  [facility] calling to see if we can refax the form that was sent out to them on 04/21/14.

## 2014-05-12 ENCOUNTER — Telehealth: Payer: Self-pay | Admitting: Family Medicine

## 2014-05-12 MED ORDER — NYSTATIN 100000 UNIT/GM EX POWD: CUTANEOUS | Status: DC

## 2014-05-12 NOTE — Telephone Encounter (Signed)
Larita FifeLynn with [facility] called for [place] concerning a yeast infection that Mrs Georgina PillionMassey has in her left side abd fold. She is also complaining of  "sharp and shooting" pain in her right ear that Larita FifeLynn thinks may be an ear infection and a sore throat. Mrs Georgina PillionMassey was offered an app[facility], but that was refused. She was also offered an appt for our Saturday clinic but said that she didn't have a ride to get there. An appt was scheduled for Monday but it was cancelled 15 mins later. Ultimately she said that she was going to an urgent care for the ear infection.

## 2014-05-12 NOTE — Telephone Encounter (Signed)
Will go ahead and send in nystatin powder for yeast infection. Pt need to be seen for ear infection. Pt awawre.

## 2014-05-16 ENCOUNTER — Ambulatory Visit (INDEPENDENT_AMBULATORY_CARE_PROVIDER_SITE_OTHER): Payer: Medicare Other | Admitting: Family Medicine

## 2014-05-16 ENCOUNTER — Ambulatory Visit: Payer: Medicare Other | Admitting: Family Medicine

## 2014-05-16 DIAGNOSIS — Z5181 Encounter for therapeutic drug level monitoring: Secondary | ICD-10-CM

## 2014-05-16 DIAGNOSIS — I4891 Unspecified atrial fibrillation: Secondary | ICD-10-CM

## 2014-05-16 LAB — POCT INR: INR: 1.6

## 2014-05-17 ENCOUNTER — Encounter: Payer: Self-pay | Admitting: Pulmonary Disease

## 2014-05-17 ENCOUNTER — Ambulatory Visit (INDEPENDENT_AMBULATORY_CARE_PROVIDER_SITE_OTHER): Payer: Medicare Other | Admitting: Pulmonary Disease

## 2014-05-17 ENCOUNTER — Telehealth: Payer: Self-pay | Admitting: *Deleted

## 2014-05-17 ENCOUNTER — Other Ambulatory Visit: Payer: Self-pay | Admitting: Family Medicine

## 2014-05-17 VITALS — BP 134/76 | HR 78

## 2014-05-17 DIAGNOSIS — J9611 Chronic respiratory failure with hypoxia: Secondary | ICD-10-CM

## 2014-05-17 DIAGNOSIS — M109 Gout, unspecified: Secondary | ICD-10-CM

## 2014-05-17 DIAGNOSIS — J849 Interstitial pulmonary disease, unspecified: Secondary | ICD-10-CM

## 2014-05-17 MED ORDER — PREDNISONE 10 MG PO TABS: ORAL_TABLET | ORAL | Status: DC

## 2014-05-17 NOTE — Assessment & Plan Note (Signed)
After our last visit when [name] [name] appeared to be as euvolemic as possible we performed a CT chest which showed evidence of groundglass and some subpleural reticulation which was suspicious for an interstitial lung disease such as nonspecific interstitial pneumonitis.. Fortunately, she did not have evidence of usual interstitial pneumonitis.  I explained to her and her son that I remain somewhat puzzled about the the causes of her chronic hypoxemic respiratory failure, but I cannot ignore these changes which appear to be an inflammatory form of fibrosis. She is truly the picture of dropsy, but it does appear that she may have some lung disease as her lungs looked this way when she was heavily diuresed. The only way to know for certain what is going on would be to perform an open lung biopsy, but she is so chronically ill she would never survive that surgery.  Her recent lab work did show some suggestion of systemic inflammation with the elevated ESR, ANA, and SSB.  We discussed various treatment options, and I explained to them that it's really best to try to minimize ill effect from anti-inflammatory therapy (prednisone). We will try to cautiously treat her with prednisone for the next several weeks looking to see if she has improvement in shortness of breath and her oxygen requirement. She and her son were advised at length of all the possible ill effects, specifically leg swelling, confusion, difficulty sleeping, brittle bones, and elevated blood sugar. They're still willing to proceed with a lower dose. We did discuss the possibility of other therapies including steroid sparing agents, but at this point they prefer to go with prednisone.  Plan:  -Prednisone 40 mg daily for 2 weeks, 30 mg daily for a month, 20 mg daily for a month -Follow-up with me in about 8 weeks, I hope to see some improvement and shortness of breath and oxygen requirements -If we see improvement then we may consider applying for a  steroid sparing agent from her insurance company.   This was explained to her and her son today in a greater than 25 minute conversation.

## 2014-05-17 NOTE — Progress Notes (Signed)
Subjective:    Patient ID: [name] [name], female    DOB: Nov 15, 1931, 81 y.o.   MRN: [id number]  Synopsis: The [name] is a 81 year old female who has a past medical history significant for congestive heart failure, pulmonary hypertension, pulmonary embolism, atrial fibrillation and obstructive sleep apnea. She previously been seen by Dr. Chesley Mires for management of sleep apnea. She uses CPAP at 7 a 10 cm of water every night. At baseline she is on 2.5 L of oxygen continuously. 12/2012 CXR > Left lung scarring vs pneumonia  10/19 RHC >RA mean 3 RV 37/6 PA 37/15, mean 23 PCWP mean 7 Oxygen saturations: PA 75% AO 92% Cardiac Output(Fick) 8.94 Cardiac Index (Fick) 4.14 Cardiac Output (Thermo) 5.05 Cardiac Index (Thermo) 2.34  02/2014 CT chest high res> mild pulm edema, very mild scattered subpleural reticulation, but assessment limited by likely pulmonary edema; 53mm nodule 04/04/2014 CT high res [place]> patchy ground glass throughout with some subpleural reticulation, suspicious for NSIP, not UIP; mild air trapping, mild cardiomegally and atheroscloerosis  HPI Chief Complaint  Patient presents with  . Follow-up    Pt c/o prod cough with light green mucus X1 week, scratchy throat, PND.     [name] [name] continues to cough some, she had some sinus drainage round new years eve and has been taking zyrtec since then.  She took antibiotics a few weeks ago for a sinus infection.  However she continues to have a scratchy throat and cough throughout the day.  She continues to struggle with dyspnea.  She continues to work with physical therapy twice per week. She says that she also does exercises while sitting at home. She does continue to have some shortness of breath. She says that her leg swelling has been stable recently. She continues to be compliant with her oxygen therapy as well as her diuretic therapy. Her weight this morning was 262 pounds which is at her goal.  Past Medical History  Diagnosis Date  .  Congestive heart failure, unspecified     a. ECHO 6/0: EF 55-60%, mild LVH, mildly dilated RV w. mildly dec fx, RVSP 67  . Chronic atrial fibrillation     failed cardioversion in past  -repeat DC-CV on October 5th, 2010  . Personal history of DVT (deep vein thrombosis) 2008  . Obstructive sleep apnea   . HTN (hypertension)   . Morbid obesity   . Shingles     with postherpetic neuraigia  . GERD (gastroesophageal reflux disease)   . Hyperlipidemia   . Meningitis 1950s  . Gallstones     s/p cholecystectomy  . CAD (coronary artery disease)     nonobstructive by cath 8/10 - LAD 40-50% w mild PAH mean 29 w PVR 3.2 [place]  . AAA (abdominal aortic aneurysm)     small  . PE (pulmonary embolism)     history of  . Anemia       Review of Systems  Constitutional: Positive for fatigue. Negative for fever, chills and diaphoresis.  HENT: Negative for congestion, postnasal drip, rhinorrhea and sinus pressure.   Respiratory: Positive for cough and shortness of breath. Negative for choking, chest tightness and wheezing.   Cardiovascular: Positive for leg swelling. Negative for chest pain.       Objective:   Physical Exam  Filed Vitals:   05/17/14 1523  BP: 134/76  Pulse: 78  SpO2: 94%  2.5L [place]   Gen: chronically ill appearing, no acute distress, obese HEENT: NCAT,  EOMi,  PULM: Few expiratory wheezes and inspiratory crackles throughout CV: Irreg irreg, no mgr, no JVD AB: BS+, soft, nontender, no hsm Ext: warm, chronic swelling in legs, no clubbing, no cyanosis    12/2012 CXR > Left lung scarring vs pneumonia  10/19 RHC >RA mean 3 RV 37/6 PA 37/15, mean 23 PCWP mean 7 Oxygen saturations: PA 75% AO 92% Cardiac Output(Fick) 8.94 Cardiac Index (Fick) 4.14 Cardiac Output (Thermo) 5.05 Cardiac Index (Thermo) 2.34  02/2014 CT chest high res> mild pulm edema, very mild scattered subpleural reticulation, but assessment limited by likely pulmonary edema; 27mm nodule   Assessment & Plan:    ILD (interstitial lung disease) After our last visit when [name] appeared to be as euvolemic as possible we performed a CT chest which showed evidence of groundglass and some subpleural reticulation which was suspicious for an interstitial lung disease such as nonspecific interstitial pneumonitis.. Fortunately, she did not have evidence of usual interstitial pneumonitis.  I explained to her and her son that I remain somewhat puzzled about the the causes of her chronic hypoxemic respiratory failure, but I cannot ignore these changes which appear to be an inflammatory form of fibrosis. She is truly the picture of dropsy, but it does appear that she may have some lung disease as her lungs looked this way when she was heavily diuresed. The only way to know for certain what is going on would be to perform an open lung biopsy, but she is so chronically ill she would never survive that surgery.  Her recent lab work did show some suggestion of systemic inflammation with the elevated ESR, ANA, and SSB.  We discussed various treatment options, and I explained to them that it's really best to try to minimize ill effect from anti-inflammatory therapy (prednisone). We will try to cautiously treat her with prednisone for the next several weeks looking to see if she has improvement in shortness of breath and her oxygen requirement. She and her son were advised at length of all the possible ill effects, specifically leg swelling, confusion, difficulty sleeping, brittle bones, and elevated blood sugar. They're still willing to proceed with a lower dose. We did discuss the possibility of other therapies including steroid sparing agents, but at this point they prefer to go with prednisone.  Plan:  -Prednisone 40 mg daily for 2 weeks, 30 mg daily for a month, 20 mg daily for a month -Follow-up with me in about 8 weeks, I hope to see some improvement and shortness of breath and oxygen requirements -If we see improvement  then we may consider applying for a steroid sparing agent from her insurance company.   This was explained to her and her son today in a greater than 25 minute conversation.   Chronic hypoxemic respiratory failure As noted previously, she did not have evidence of pulmonary hypertension on a right heart catheterization. I believe that her chronic hypoxemic respiratory failure is related to an inflammatory lung condition as well as chronic congestive heart failure.  Plan: -Continue oxygen as written -See plan detailed above. -Continue congestive heart failure management with a weight goal of 262 pounds to 265 pounds with diuretic therapy as outlined by Dr. Rockey Situ    Updated Medication List Outpatient Encounter Prescriptions as of 05/17/2014  Medication Sig  . acetaminophen (TYLENOL) 325 MG tablet Take 2 tablets (650 mg total) by mouth every 4 (four) hours as needed for headache or mild pain.  [name] allopurinol (ZYLOPRIM) 100 MG tablet Take 2 tablets (200 mg  total) by mouth daily.  [name] amiodarone (PACERONE) 200 MG tablet   . atorvastatin (LIPITOR) 10 MG tablet Take 1 tablet (10 mg total) by mouth daily.  . budesonide (PULMICORT) 0.5 MG/2ML nebulizer solution Take 2 mLs (0.5 mg total) by nebulization 2 (two) times daily.  . cetirizine (ZYRTEC ALLERGY) 10 MG tablet Take 10 mg by mouth as needed.   . colchicine 0.6 MG tablet Take 0.6 mg by mouth as needed.  . diclofenac sodium (VOLTAREN) 1 % GEL Apply 2 g topically 4 (four) times daily.  [name] diltiazem (CARDIZEM CD) 120 MG 24 hr capsule Take 1 capsule (120 mg total) by mouth daily.  [name] escitalopram (LEXAPRO) 20 MG tablet Take 1 tablet (20 mg total) by mouth daily.  . famotidine (PEPCID) 20 MG tablet Take 1 tablet (20 mg total) by mouth daily.  [name] gabapentin (NEURONTIN) 300 MG capsule Take 1 capsule (300 mg total) by mouth daily.  [name] levalbuterol (XOPENEX) 0.63 MG/3ML nebulizer solution Take 3 mLs (0.63 mg total) by nebulization 4 (four) times daily.  [name]  nystatin (MYCOSTATIN/NYSTOP) 100000 UNIT/GM POWD Apply to affectred area QID  . potassium chloride SA (K-DUR,KLOR-CON) 20 MEQ tablet Take 1 tablet (20 mEq total) by mouth daily. (Patient taking differently: Take 40 mEq by mouth 2 (two) times daily. )  . torsemide (DEMADEX) 20 MG tablet Take 2 tablets (40 mg total) by mouth 2 (two) times daily.  [name] warfarin (COUMADIN) 2.5 MG tablet Take 1.25-2.5 mg by mouth See admin instructions. 1/2 tablet only on Monday and 1 tablet all the rest of the days  . predniSONE (DELTASONE) 10 MG tablet $RemoveB'40mg'xvhsyJCa$  daily for two weeks, $RemoveBe'30mg'AZypyhlGd$  daily for one month, then $RemoveBef'20mg'KLJOAEjnGI$  daily until you see me  . [DISCONTINUED] doxycycline (VIBRA-TABS) 100 MG tablet Take 1 tablet (100 mg total) by mouth 2 (two) times daily. (Patient not taking: Reported on 05/17/2014)

## 2014-05-17 NOTE — Patient Instructions (Signed)
Take the prednisone as follows: 40mg  daily for 2 weeks, then 30mg  daily for 2 weeks, then 20mg  daily until you see me next We will see you back in 8 weeks or sooner if needed

## 2014-05-17 NOTE — Assessment & Plan Note (Signed)
As noted previously, she did not have evidence of pulmonary hypertension on a right heart catheterization. I believe that her chronic hypoxemic respiratory failure is related to an inflammatory lung condition as well as chronic congestive heart failure.  Plan: -Continue oxygen as written -See plan detailed above. -Continue congestive heart failure management with a weight goal of 262 pounds to 265 pounds with diuretic therapy as outlined by Dr. Mariah MillingGollan

## 2014-05-17 NOTE — Telephone Encounter (Signed)
Received fax from [facility] requesting prior authorization for Budesonide.  PA completed on CoverMyMeds.

## 2014-05-18 NOTE — Telephone Encounter (Signed)
Received fax from [facility] that more information was needed to complete PA. I completed the form using info provided in pt's medical record, and faxed back to company. Faxed form placed in your inbox.

## 2014-05-19 NOTE — Telephone Encounter (Signed)
PA Denied.  States Drugs used in a nebulizer at your home are covered under Medicare Part B and not Medicare Part D.   Letter faxed to [facility] asking them to bill Medicare Part B for coverage of this drug.

## 2014-05-20 ENCOUNTER — Telehealth: Payer: Self-pay | Admitting: Family Medicine

## 2014-05-20 NOTE — Telephone Encounter (Signed)
Agree to verbal order for HH x 60 more days.

## 2014-05-20 NOTE — Telephone Encounter (Signed)
Larita FifeLynn with [facility] calling.  She states [place] certification [facility] will run out next week and Larita FifeLynn would like to continue with [name] to monitor her CHF for 60 more days as well as continue checking her PT/INR for us.   Verbal order given to [facility] more days.

## 2014-05-20 NOTE — Telephone Encounter (Signed)
Larita FifeLynn called and asked for you to call her back.

## 2014-05-24 ENCOUNTER — Telehealth: Payer: Self-pay | Admitting: Cardiovascular Disease

## 2014-05-24 NOTE — Telephone Encounter (Signed)
[facility]urse from [facility] calling stating that brother in law has been giving patient potasuim pills 2 a day and it was suppose to be given on once a day.  She just needs us to clarify which one is correct

## 2014-05-24 NOTE — Telephone Encounter (Signed)
Left detailed message for Larita FifeLynn that our records indicate that pt is taking:  potassium chloride SA (K-DUR,KLOR-CON) 20 MEQ tablet Take 1 tablet (20 mEq total) by mouth daily. (Patient taking differently: Take 40 mEq by mouth 2 (two) times daily. )  Gwendolyn LimaAdvised Lynn to continue pt's current dose, as her last labs have been WNL. Advised her that pt has f/u w/ Dr. Mariah MillingGollan next month and gave verbal order to check BMET if she is unsure of pt's dose.  Asked her to call back w/ any questions or concerns.

## 2014-05-27 ENCOUNTER — Telehealth: Payer: Self-pay

## 2014-05-27 NOTE — Telephone Encounter (Signed)
Okay to give verbal order for uric acid. The ICD10 code is  M1A.9XX0

## 2014-05-27 NOTE — Telephone Encounter (Signed)
Larita FifeLynn nurse with [facility] left v/m; pt thinks [facility]2 [facility]. Larita FifeLynn request verbal order for uric acid level with ICD 10 code if Dr Ermalene SearingBedsole would like pt to have lab drawn in the home.Please advise.

## 2014-05-30 NOTE — Telephone Encounter (Signed)
Verbal order to drawn Uric Acid and ICD10 Code [facility] to [facility].

## 2014-05-31 ENCOUNTER — Telehealth: Payer: Self-pay | Admitting: Family Medicine

## 2014-05-31 NOTE — Telephone Encounter (Signed)
Larita FifeLynn from [facility] called to report pt's pt/inr:  PT: 25.8 INR: 2.1  When should they repeat? Please advise

## 2014-06-01 NOTE — Telephone Encounter (Signed)
As long as no med changes, antibiotics or diet changes.[name].[name].COntinue current dose. Repeat in 4 weeks given therapeutic level.

## 2014-06-02 NOTE — Telephone Encounter (Signed)
Left message for [name] [name] wit [name]:  As long as no med changes, antibiotics or diet changes.[name].[name].Continue current dose. Repeat PT/ INR in 4 weeks given therapeutic level per [name].

## 2014-06-02 NOTE — Telephone Encounter (Signed)
[facility] message [name] my call.

## 2014-06-07 DIAGNOSIS — I5032 Chronic diastolic (congestive) heart failure: Secondary | ICD-10-CM

## 2014-06-07 DIAGNOSIS — I129 Hypertensive chronic kidney disease with stage 1 through stage 4 chronic kidney disease, or unspecified chronic kidney disease: Secondary | ICD-10-CM

## 2014-06-07 DIAGNOSIS — L989 Disorder of the skin and subcutaneous tissue, unspecified: Secondary | ICD-10-CM

## 2014-06-07 DIAGNOSIS — I4891 Unspecified atrial fibrillation: Secondary | ICD-10-CM

## 2014-06-08 ENCOUNTER — Telehealth: Payer: Self-pay | Admitting: *Deleted

## 2014-06-08 MED ORDER — ALLOPURINOL 300 MG PO TABS: 300.0000 mg | ORAL_TABLET | Freq: Every day | ORAL | Status: DC

## 2014-06-08 NOTE — Telephone Encounter (Signed)
Ms. [name] [name] notified uric acid level is almost at goal at 7.6 mg/dl.  Per [name] will increase allopurinol to 300 mg one tablet daily.  [facility].  Will have [name] recheck uric acid level in 2 to 3 weeks.  Patient states understanding.  Left [facility] to redraw uric acid in 2 to 3 weeks.

## 2014-06-09 ENCOUNTER — Encounter: Payer: Self-pay | Admitting: Family Medicine

## 2014-06-13 ENCOUNTER — Telehealth: Payer: Self-pay | Admitting: Pulmonary Disease

## 2014-06-13 NOTE — Telephone Encounter (Signed)
Spoke with pt. Advised her that [facility]'s schedule is limited for [place] and we could switch her to [facility] care but she declined, she wants to stay with [facility]. Made her aware [place] [facility] [place] wanted to [facility] care with [facility]. She agreed and she has been scheduled [place]

## 2014-06-20 ENCOUNTER — Telehealth: Payer: Self-pay

## 2014-06-20 NOTE — Telephone Encounter (Signed)
Spoke w/ pt.  Advised her that I spoke w/ Dr. Mariah MillingGollan and he recommends she take torsemide 60 mg in am and 40 mg in pm for the next few days and call back to let us know how she is doing.  She verbalizes understanding and will call back w/ any questions or concerns.

## 2014-06-20 NOTE — Telephone Encounter (Signed)
Spoke w/ pt.  She reports that Dr. [name] started her on prednisone last month and her wt has been steadily increasing since then, despite her "healthy diet". Reports worsening SOB and she would like to know how many extra torsemide she can take, as she is currently taking 40 mg BID. Reports she has gained 10 lbs since 1/20, though her numbers are: 1/20: 267 1/24:269 1/28: 267 2/1: 273 1/21: 266 1/25: 265 1/29: 267 2/2: 273 1/22: 267 1/26: 267 1/30: 268 2/3: 274 1/23: 267 1/27: 267 1/31: 271 Reports her wt today is 275. Offered pt appt to see Eula Listenyan Dunn, PA, but pt declines, as she would like instructions over the phone.  Please advise.  Thank you.

## 2014-06-20 NOTE — Telephone Encounter (Signed)
Pt states her weight is 275. States her lung Dr., Dr. [name], Put her on prednisone about 1 month ago.

## 2014-06-21 ENCOUNTER — Telehealth: Payer: Self-pay | Admitting: Family Medicine

## 2014-06-21 NOTE — Telephone Encounter (Signed)
Verbal order given to [facility] 2 x week.

## 2014-06-21 NOTE — Telephone Encounter (Signed)
[facility] is calling to get a verbal order for PT for 2 times per week, increase in visits.  Best number to call back is [phone] Kendell Baneroy  Thanks

## 2014-06-22 ENCOUNTER — Telehealth: Payer: Self-pay | Admitting: *Deleted

## 2014-06-22 NOTE — Telephone Encounter (Signed)
Paperwork is on your desk.

## 2014-06-22 NOTE — Telephone Encounter (Signed)
Home health calling asking about a fax they sent to us, home health order for patient. For INR home checks, please call when done. Or if not received.

## 2014-06-24 NOTE — Telephone Encounter (Signed)
Faxed yesterday at 5:00.

## 2014-06-27 ENCOUNTER — Other Ambulatory Visit: Payer: Self-pay | Admitting: *Deleted

## 2014-06-27 ENCOUNTER — Telehealth: Payer: Self-pay

## 2014-06-27 MED ORDER — WARFARIN SODIUM 2.5 MG PO TABS: ORAL_TABLET | ORAL | Status: DC

## 2014-06-27 NOTE — Telephone Encounter (Signed)
refaxed orders, Tiffany states they did not receive orders.

## 2014-06-28 ENCOUNTER — Telehealth: Payer: Self-pay | Admitting: *Deleted

## 2014-06-28 NOTE — Telephone Encounter (Signed)
Pt is calling stating she has gained 4 pounds yesterday, states she is eating more but is trying to eat healthy.  Dr Mariah MillingGollan increased med but it is not doing much.  Please advise

## 2014-06-28 NOTE — Telephone Encounter (Signed)
Spoke w/ pt.  She reports 4 lb wt gain since yesterday. Reports that she had soup, but she "watered it down to make it low sodium", and "might have overdone it w/ my fluids". Had lengthy discussion w/ pt about her diet, sodium and fluids. Reports that she currently takes torsemide 60 mg in the am and 40 gm in the pm, but has not noticed much difference.  She states that her home health nurse reported no fluid in her lungs. She states that her "waddle" underneath her neck seems to be getting larger, but she is unsure if this is r/t her prednisone.  She has an appt w/ Dr. Mariah MillingGollan next week, but would like to know what to do in the meantime.

## 2014-06-28 NOTE — Telephone Encounter (Signed)
Spoke w/ pt.  Advised her that Dr. Mariah MillingGollan recommends she increase her torsemide to 60 mg tonight. Advised her that I will call her tomorrow am to see if she is feeling better and to give her an appt to the HF clinic.  She is agreeable to this and appreciative of the call.

## 2014-06-29 ENCOUNTER — Telehealth: Payer: Self-pay | Admitting: Family Medicine

## 2014-06-29 NOTE — Telephone Encounter (Signed)
Larita FifeLynn at [facility] called with patient's PT/INR results.  PT-24.1,INR-2.0.  Please call Larita FifeLynn with instructions.

## 2014-06-29 NOTE — Telephone Encounter (Signed)
Spoke with Ms. Vassey.  She states Dr. Lenard GallowayGollen normally doses her warfarin.  Her range is 1.8 to 2.0 and that an INR of 2.0 is perfect for her.  I have also left a message for [facility] to return my call so I can clarify this information with her.

## 2014-06-29 NOTE — Telephone Encounter (Signed)
Left message for the HF clinic to call back to sched appt.

## 2014-06-29 NOTE — Telephone Encounter (Signed)
She remains subtheraputic second time in a row.. We need to increase her dose slightly. What is her current dose?

## 2014-06-30 ENCOUNTER — Encounter: Payer: Self-pay | Admitting: Family Medicine

## 2014-06-30 NOTE — Telephone Encounter (Signed)
Sp[facility] with [facility] to clarify Ms. Boisselle coumadin dose and range.  Larita FifeLynn states Charmine's PT/INR range set by Dr. Lenard GallowayGollen is 1.8 to 2.2.  She is currently taking 2.5 mg M, W, F, and Sat; 3.75 mg Tu, Thur and Sunday.  Per French Anaracy in the coumadin clinic, [name] is suppose to be the one dosing her Coumadin.  Larita FifeLynn states she use to just call Carlena SaxBlair with [facility] PT/INRs and she would dose her.  Larita FifeLynn did state that when OwenPeggy sees Dr. Lenard GallowayGollen at her next office visit, she is going to talk with Dr. Lenard GallowayGollen about switching off coumadin to eliquis.  Larita FifeLynn ask when to recheck her PT/INR.  I recommended rechecking it in 4 weeks.  Will forward note to [name] for review.

## 2014-06-30 NOTE — Telephone Encounter (Signed)
Pt sched for HF clinic 07/21/14 @ 11:00. Attempted to contact pt to notify her, but no answer and her voicemail box is not set up yet.

## 2014-06-30 NOTE — Telephone Encounter (Signed)
That is fine if cardiology verify this goal range for her. This is not typical.  If they dose her coumadin... May be PT INR should go to them? Yes?

## 2014-06-30 NOTE — Telephone Encounter (Signed)
Agree... If that is her goal range. Stay on same dose and recheck in 4 weeks.

## 2014-07-05 ENCOUNTER — Telehealth: Payer: Self-pay

## 2014-07-05 NOTE — Telephone Encounter (Signed)
[name] [name] at 07/05/2014 2:37 PM     Status: Signed       Expand All Collapse All   Pt has continued to gain weight 279.2, 4.2 lbs since last Wednesday. Please call. States pt is feeling more tired. Has had intermittent CP, SOB, and weak.

## 2014-07-05 NOTE — Telephone Encounter (Signed)
Please see previous phone note.  

## 2014-07-05 NOTE — Telephone Encounter (Signed)
Pt has continued to gain weight 279.2, 4.2 lbs since last Wednesday. Please call. States pt is feeling more tired. Has had intermittent CP, SOB, and weak.

## 2014-07-06 ENCOUNTER — Encounter: Payer: Self-pay | Admitting: Cardiovascular Disease

## 2014-07-06 ENCOUNTER — Ambulatory Visit (INDEPENDENT_AMBULATORY_CARE_PROVIDER_SITE_OTHER): Payer: Medicare Other | Admitting: Cardiovascular Disease

## 2014-07-06 ENCOUNTER — Telehealth: Payer: Self-pay | Admitting: Family Medicine

## 2014-07-06 VITALS — BP 130/70 | HR 91 | Ht 65.0 in | Wt 280.8 lb

## 2014-07-06 DIAGNOSIS — I5033 Acute on chronic diastolic (congestive) heart failure: Secondary | ICD-10-CM

## 2014-07-06 DIAGNOSIS — E7800 Pure hypercholesterolemia, unspecified: Secondary | ICD-10-CM

## 2014-07-06 DIAGNOSIS — R0602 Shortness of breath: Secondary | ICD-10-CM

## 2014-07-06 DIAGNOSIS — I10 Essential (primary) hypertension: Secondary | ICD-10-CM

## 2014-07-06 DIAGNOSIS — I4891 Unspecified atrial fibrillation: Secondary | ICD-10-CM

## 2014-07-06 DIAGNOSIS — J849 Interstitial pulmonary disease, unspecified: Secondary | ICD-10-CM

## 2014-07-06 MED ORDER — TORSEMIDE 20 MG PO TABS: 40.0000 mg | ORAL_TABLET | Freq: Two times a day (BID) | ORAL | Status: DC

## 2014-07-06 NOTE — Telephone Encounter (Signed)
[name] [name] called He would like to get [name] [name] PT continued for 1 time a week for about 3 weeks. Verbal order ok

## 2014-07-06 NOTE — Patient Instructions (Signed)
You are doing well. No medication changes were made.  We will check your labs today  Please call us if you have new issues that need to be addressed before your next appt.  Your physician wants you to follow-up in: 3 months.  You will receive a reminder letter in the mail two months in advance. If you don't receive a letter, please call our office to schedule the follow-up appointment.

## 2014-07-06 NOTE — Assessment & Plan Note (Signed)
Encouraged her to stay on her Lipitor 

## 2014-07-06 NOTE — Assessment & Plan Note (Signed)
We have recommended she go back on torsemide 40 mg twice a day. Basic metabolic panel today. If she is dehydrated, will cut back on her torsemide. Recent weight gain likely from prednisone

## 2014-07-06 NOTE — Telephone Encounter (Signed)
Verbal [facility] to continue PT 1x a week for 3 weeks.

## 2014-07-06 NOTE — Assessment & Plan Note (Signed)
Blood pressure is well controlled on today's visit. No changes made to the medications. 

## 2014-07-06 NOTE — Progress Notes (Signed)
Patient ID: [name] [name], female    DOB: 11/07/1931, 81 y.o.   MRN: [id number]  HPI Comments: 81 y/o woman with h/o morbid obesity, sleep apnea and obesity hypoventilation syndrome, atrial fib maintaining NSR on flecainide, DVT on the left and PE in 2007, diastolic HF on chronic diuretics, nonobstructive CAD by cath 8/10 and small AAA, chronic shortness of breath, edema.  She presents for routine followup of her diastolic CHF  In follow-up today, she reports that she has been on high-dose prednisone for 7 weeks for underlying pulmonary disease. Seen by Dr. [name] She reports that her breathing has improved on the prednisone. Weight is up 20 pounds. Initially she felt this was fluid as her baseline weight was 260-265 pounds. She has been taking increasing amounts of torsemide. No recent blood work She denies any lower extremity edema. She has new tissue deposits around her face and neck. She feels this is from the prednisone Otherwise she is in good spirits She has been taking torsemide 60 mg twice a day for weight gain  EKG on today's visit shows normal sinus rhythm with rate 91 bpm, no significant ST or T-wave changes  Other past medical history  hospitalization 02/18/2014 with 2 weeks in the hospital, then 2 weeks in rehabilitation, discharge 03/21/2014. Initial echocardiogram on arrival to the hospital showed severe pulmonary hypertension. She had aggressive diuresis for 5 days then right heart catheterization showing essentially normal right heart pressures. She was discharged to rehabilitation with weight 255 pounds, down from 267 pounds per the patient. \On discharge from the rehabilitation, she was 281 pounds. She was not on a diuretic through the 2 weeks. She call our office after discharge with worsening lower extremity edema and shortness of breath. She was started on torsemide 40 g twice a day. Weight is now 263 pounds at home. Leg edema has resolved, she feels well overall with  minimal shortness of breath. She states that weight has been stable on this torsemide 40 mg twice a day dosing. Recent basic metabolic panel reviewed with her showing potassium 4.9, essentially normal renal function  Chronic wound healing issues of her right lower extremity .   Other past medical history  in the hospital 08/31/2013. heart failure symptoms, had aggressive diuresis in the hospital with 15 pound weight drop.  Over the past several months her creatinine has slowly climbed from her baseline 1.2, up to 1.6, 1.8   Previous episode of syncope/loss of consciousness in February 2014 on Valentine's Day. Creatinine at that time was 1.7 .She was given IV fluids. secondary to a huge hematoma of her right leg, she has had months of wound evaluation. Not a candidate for skin grafting. She has a leg wrap on the right. No swelling of her left lower extremity, only the right. Region of skin loss on the right is extensive. discharge 07/27/2012   hospitalization 07/14/2013 for epistaxis. INR was 3.7. She had her nose packed and cauterized. Cardiology evaluated the patient on 06/17/2013. Flecainide and diltiazem was continued. She was discharged on 06/18/2013 Lab work in the hospital 06/16/2013 showed creatinine 1.84, BUN 60, potassium 3.1  Echocardiogram in the hospital 06/27/2012 was essentially normal, mildly dilated left atrium Echocardiogram October 2015 with normal ejection fraction, severe pulmonary hypertension   No Known Allergies  Outpatient Encounter Prescriptions as of 07/06/2014  Medication Sig  . allopurinol (ZYLOPRIM) 300 MG tablet Take 1 tablet (300 mg total) by mouth daily.  [name]. amiodarone (PACERONE) 200 MG tablet Take 100  mg by mouth daily.   [name] aspirin 81 MG tablet Take 81 mg by mouth daily.  [name] atorvastatin (LIPITOR) 10 MG tablet Take 1 tablet (10 mg total) by mouth daily.  . budesonide (PULMICORT) 0.5 MG/2ML nebulizer solution Take 2 mLs (0.5 mg total) by nebulization 2 (two)  times daily.  . cetirizine (ZYRTEC ALLERGY) 10 MG tablet Take 10 mg by mouth as needed.   . colchicine 0.6 MG tablet Take 0.6 mg by mouth as needed.  . diltiazem (CARDIZEM CD) 120 MG 24 hr capsule Take 1 capsule (120 mg total) by mouth daily.  [name] escitalopram (LEXAPRO) 20 MG tablet Take 1 tablet (20 mg total) by mouth daily.  . famotidine (PEPCID) 20 MG tablet Take 1 tablet (20 mg total) by mouth daily.  [name] gabapentin (NEURONTIN) 300 MG capsule Take 1 capsule (300 mg total) by mouth daily.  [name] levalbuterol (XOPENEX) 0.63 MG/3ML nebulizer solution Take 3 mLs (0.63 mg total) by nebulization 4 (four) times daily.  . potassium chloride SA (K-DUR,KLOR-CON) 20 MEQ tablet Take 20 mEq by mouth daily.  . predniSONE (DELTASONE) 10 MG tablet  daily for two weeks,  daily for one month, then  daily until you see me  . torsemide (DEMADEX) 20 MG tablet Take 2 tablets (40 mg total) by mouth 2 (two) times daily.  [name] warfarin (COUMADIN) 2.5 MG tablet Take as directed.  . [DISCONTINUED] potassium chloride SA (K-DUR,KLOR-CON) 20 MEQ tablet Take 1 tablet (20 mEq total) by mouth daily. (Patient taking differently: Take 40 mEq by mouth 2 (two) times daily. )  . [DISCONTINUED] torsemide (DEMADEX) 20 MG tablet Take 2 tablets (40 mg total) by mouth 2 (two) times daily.  . [DISCONTINUED] acetaminophen (TYLENOL) 325 MG tablet Take 2 tablets (650 mg total) by mouth every 4 (four) hours as needed for headache or mild pain. (Patient not taking: Reported on 07/06/2014)  . [DISCONTINUED] diclofenac sodium (VOLTAREN) 1 % GEL Apply 2 g topically 4 (four) times daily. (Patient not taking: Reported on 07/06/2014)  . [DISCONTINUED] nystatin (MYCOSTATIN/NYSTOP) 100000 UNIT/GM POWD Apply to affectred area QID (Patient not taking: Reported on 07/06/2014)    Past Medical History  Diagnosis Date  . Congestive heart failure, unspecified     a. ECHO 6/0: EF 55-60%, mild LVH, mildly dilated RV w. mildly dec fx, RVSP 67  . Chronic  atrial fibrillation     failed cardioversion in past  -repeat DC-CV on October 5th, 2010  . Personal history of DVT (deep vein thrombosis) 2008  . Obstructive sleep apnea   . HTN (hypertension)   . Morbid obesity   . Shingles     with postherpetic neuraigia  . GERD (gastroesophageal reflux disease)   . Hyperlipidemia   . Meningitis 1950s  . Gallstones     s/p cholecystectomy  . CAD (coronary artery disease)     nonobstructive by cath 8/10 - LAD 40-50% w mild PAH mean 29 w PVR 3.2 [place]  . AAA (abdominal aortic aneurysm)     small  . PE (pulmonary embolism)     history of  . Anemia     Past Surgical History  Procedure Laterality Date  . Appendectomy  1947  . Tonsillectomy  1946  . Hospitalized with meningitis at [place]  1960  . Cholecystectomy    . Cardioversion  01/2007    x2  . Right heart catheterization N/A 02/28/2014    Procedure: RIGHT HEART CATH;  Surgeon: Laurey Morale, MD;  Location: [facility]  CATH LAB;  Service: Cardiovascular;  Laterality: N/A;    Social History  reports that she has never smoked. She has never used smokeless tobacco. She reports that she drinks alcohol. She reports that she does not use illicit drugs.  Family History family history includes Depression in her mother; Heart disease in her paternal grandfather; Heart failure in her mother; Obesity in her other; Ovarian cancer in her paternal grandmother.   Review of Systems  Constitutional: Negative.   Respiratory: Positive for shortness of breath.   Cardiovascular: Negative.   Gastrointestinal: Negative.   Musculoskeletal: Positive for gait problem.  Skin: Positive for wound.       Right lower extremity, bandage in place  Neurological: Negative.   Hematological: Negative.   Psychiatric/Behavioral: Negative.   All other systems reviewed and are negative.   BP 130/70 mmHg  Pulse 91  Ht  (1.651 m)  Wt 280 lb 12 oz (127.347 kg)  BMI 46.72 kg/m2  Physical Exam  Constitutional: She  is oriented to person, place, and time. She appears well-developed and well-nourished.  Obese  HENT:  Head: Normocephalic.  Nose: Nose normal.  Mouth/Throat: Oropharynx is clear and moist.  Eyes: Conjunctivae are normal. Pupils are equal, round, and reactive to light.  Neck: Normal range of motion. Neck supple. No JVD present.  Cardiovascular: Normal rate, regular rhythm, S1 normal, S2 normal, normal heart sounds and intact distal pulses.  Exam reveals no gallop and no friction rub.   No murmur heard. Pulmonary/Chest: Effort normal and breath sounds normal. No respiratory distress. She has no wheezes. She has no rales. She exhibits no tenderness.  Abdominal: Soft. Bowel sounds are normal. She exhibits no distension. There is no tenderness.  Musculoskeletal: Normal range of motion. She exhibits no edema or tenderness.  Lymphadenopathy:    She has no cervical adenopathy.  Neurological: She is alert and oriented to person, place, and time. Coordination normal.  Skin: Skin is warm and dry. No rash noted. No erythema.  Psychiatric: She has a normal mood and affect. Her behavior is normal. Judgment and thought content normal.    Assessment and Plan  Nursing note and vitals reviewed.

## 2014-07-06 NOTE — Assessment & Plan Note (Signed)
Maintaining normal sinus rhythm. Currently on warfarin monitored by primary care. She is on amiodarone 100 mg daily

## 2014-07-06 NOTE — Assessment & Plan Note (Signed)
Managed by pulmonary. Slow taper on her prednisone, currently on 20 g daily. Follow-up with pulmonary next week

## 2014-07-07 LAB — BASIC METABOLIC PANEL
BUN/Creatinine Ratio: 24 (ref 11–26)
BUN: 30 mg/dL — ABNORMAL HIGH (ref 8–27)
CHLORIDE: 97 mmol/L (ref 97–108)
CO2: 30 mmol/L — ABNORMAL HIGH (ref 18–29)
Calcium: 9.5 mg/dL (ref 8.7–10.3)
Creatinine, Ser: 1.27 mg/dL — ABNORMAL HIGH (ref 0.57–1.00)
GFR, EST AFRICAN AMERICAN: 46 mL/min/{1.73_m2} — AB (ref >59)
GFR, EST NON AFRICAN AMERICAN: 40 mL/min/{1.73_m2} — AB (ref >59)
Glucose: 124 mg/dL — ABNORMAL HIGH (ref 65–99)
Potassium: 4.4 mmol/L (ref 3.5–5.2)
Sodium: 143 mmol/L (ref 134–144)

## 2014-07-13 ENCOUNTER — Telehealth: Payer: Self-pay | Admitting: *Deleted

## 2014-07-13 NOTE — Telephone Encounter (Signed)
Spoke w/ Lar[facility] FifeLynn, [facility]. She reports that pt weighs 285 today, has SOB at rest, is "gaspy" when she talks, has b/l 2+ pitting edema, blisters on her legs, crackles in her lungs, and abdominal distention. Pt reports that she has "only indulged in a chicken breast last week", but has otherwise followed a low sodium diet and restricted her fluids. Discussed w/ Larita FifeLynn that pt's BMET indicated that she was dehydrated and that her swelling was from prednisone, not fluid. Pt was advised to hold her torsemide for 2 days, she resumed on Sat at 40 mg BID. Spoke w/ Dr. Mariah MillingGollan who recommends that pt wrap her legs, keep her feet elevated, watch her diet and add a small amount of torsemide. Gwendolyn LimaAdvised Lynn of Dr. Windell HummingbirdGollan's recommendation.  She states that pt is "begging" to take her metolazone, as her urine output has remained the same.  Advised against this and had Larita FifeLynn explain that this can damage her kidneys.  Larita FifeLynn reports that she will take the metolazone w/ her so that pt does not take this.  Advised her to have pt take an extra 1/2 torsemide tonight and in the am. Asked her to have pt call me tomorrow w/ wt, BP and sx.  Larita FifeLynn states that she will come back out to visit pt tomorrow and call me after lunch, as pt likes to sleep late.

## 2014-07-13 NOTE — Telephone Encounter (Signed)
Home health is calling with patient stating she has gained 3 pound since last week, 285 lbs today SOB at rest, once she states talking Oxygen is around 92-93  Swelling in legs  Needs to know what can be done.

## 2014-07-14 NOTE — Telephone Encounter (Signed)
Spoke to [name] [name], she is returning our call. Saying pt:  Her weight 284 down, one pound She still have 2 plus edema  in her leg Still has fine crackles in lungs But her SOB at rest has improved.  Please call lynn if we have any questions [phone]

## 2014-07-15 NOTE — Telephone Encounter (Signed)
Spoke w/ pt.  She reports that she lost a lb yesterday, but has gained it back today.  Reports that her SOB has improved slightly.  Offered her appt to see Eula Listenyan Dunn, PA today at 1:45, but she declines, as she has no way to get here.  She reports that she has an appt w/ Dr. [name] on Monday to discuss.  Asked her to call back w/ any other questions or concerns.

## 2014-07-18 ENCOUNTER — Ambulatory Visit (INDEPENDENT_AMBULATORY_CARE_PROVIDER_SITE_OTHER): Payer: Medicare Other | Admitting: Pulmonary Disease

## 2014-07-18 ENCOUNTER — Encounter: Payer: Self-pay | Admitting: Pulmonary Disease

## 2014-07-18 ENCOUNTER — Other Ambulatory Visit: Payer: Self-pay | Admitting: *Deleted

## 2014-07-18 VITALS — BP 142/86 | HR 75 | Temp 97.0°F | Ht 65.0 in | Wt 285.0 lb

## 2014-07-18 DIAGNOSIS — I5032 Chronic diastolic (congestive) heart failure: Secondary | ICD-10-CM

## 2014-07-18 DIAGNOSIS — J849 Interstitial pulmonary disease, unspecified: Secondary | ICD-10-CM

## 2014-07-18 DIAGNOSIS — J9611 Chronic respiratory failure with hypoxia: Secondary | ICD-10-CM

## 2014-07-18 MED ORDER — PREDNISONE 5 MG PO TABS: ORAL_TABLET | ORAL | Status: DC

## 2014-07-18 MED ORDER — DILTIAZEM HCL ER COATED BEADS 120 MG PO CP24: 120.0000 mg | ORAL_CAPSULE | Freq: Every day | ORAL | Status: DC

## 2014-07-18 MED ORDER — ATORVASTATIN CALCIUM 10 MG PO TABS: 10.0000 mg | ORAL_TABLET | Freq: Every day | ORAL | Status: DC

## 2014-07-18 NOTE — Telephone Encounter (Signed)
She's been recently seen in clinic. The bulk of her weight gain, leg swelling is from prednisone, not CHF

## 2014-07-18 NOTE — Progress Notes (Signed)
Subjective:    Patient ID: [name] [name], female    DOB: 03/18/1932, 81 y.o.   MRN: [id number]  Synopsis: The Georgina PillionMassey is a 81 year old female who has a past medical history significant for congestive heart failure, pulmonary hypertension, pulmonary embolism, atrial fibrillation and obstructive sleep apnea. She previously been seen by Dr. Coralyn HellingVineet Sood for management of sleep apnea. She uses CPAP at 7 a 10 cm of water every night. At baseline she is on 2.5 L of oxygen continuously. 12/2012 CXR > Left lung scarring vs pneumonia  10/19 RHC >RA mean 3 RV 37/6 PA 37/15, mean 23 PCWP mean 7 Oxygen saturations: PA 75% AO 92% Cardiac Output(Fick) 8.94 Cardiac Index (Fick) 4.14 Cardiac Output (Thermo) 5.05 Cardiac Index (Thermo) 2.34  02/2014 CT chest high res> mild pulm edema, very mild scattered subpleural reticulation, but assessment limited by likely pulmonary edema; 4mm nodule 04/04/2014 CT high res [place]> patchy ground glass throughout with some subpleural reticulation, suspicious for NSIP, not UIP; mild air trapping, mild cardiomegally and atheroscloerosis  HPI Chief Complaint  Patient presents with  . Follow-up    Pt states that since being on Pred, breathing had improved some in the beginning. Pt reports since tapering down from 40mg  to current dose of 20mg  she has been having increased SOB, mod/severe swelling, skin weeping, tightness in skin, hazy film over eyes and weight gain. Pt states that her O2 has been dropping into 70%   07/18/3014 ROV> [name] [name] has been welling more and she thinks that the prednisone has caused a "film" to form over her eyes.  She has gained twenty pounds.  Her BMET recently suggested that she was volume depleted.  She says that when she took the prednisone 40mg  daily she it helped her breathing better.  She says that she has been more fatigued and short of breath recently. She had blood work done recently. She has not been coughing more recently. She continues to use and  benefit from her oxygen.  Past Medical History  Diagnosis Date  . Congestive heart failure, unspecified     a. ECHO 6/0: EF 55-60%, mild LVH, mildly dilated RV w. mildly dec fx, RVSP 67  . Chronic atrial fibrillation     failed cardioversion in past  -repeat DC-CV on October 5th, 2010  . Personal history of DVT (deep vein thrombosis) 2008  . Obstructive sleep apnea   . HTN (hypertension)   . Morbid obesity   . Shingles     with postherpetic neuraigia  . GERD (gastroesophageal reflux disease)   . Hyperlipidemia   . Meningitis 1950s  . Gallstones     s/p cholecystectomy  . CAD (coronary artery disease)     nonobstructive by cath 8/10 - LAD 40-50% w mild PAH mean 29 w PVR 3.2 [place]  . AAA (abdominal aortic aneurysm)     small  . PE (pulmonary embolism)     history of  . Anemia       Review of Systems  Constitutional: Positive for fatigue. Negative for fever, chills and diaphoresis.  HENT: Negative for congestion, postnasal drip, rhinorrhea and sinus pressure.   Respiratory: Positive for cough and shortness of breath. Negative for choking, chest tightness and wheezing.   Cardiovascular: Positive for leg swelling. Negative for chest pain.       Objective:   Physical Exam  Filed Vitals:   07/18/14 1644  BP: 142/86  Pulse: 75  Temp: 97 F (36.1 C)  TempSrc: Oral  Height: 5'  5" (1.651 m)  Weight: 285 lb (129.275 kg)  SpO2: 90%  2.5L [place]   Gen: chronically ill appearing, no acute distress, obese HEENT: NCAT,  EOMi,  PULM: Inspiratory crackles in the bases only, no wheezing today CV: Irreg irreg, slight systolic murmur, cannot assess JVD AB: BS+, soft, nontender, no hsm Ext: warm, chronic swelling in legs, no clubbing, no cyanosis    12/2012 CXR > Left lung scarring vs pneumonia  10/19 RHC >RA mean 3 RV 37/6 PA 37/15, mean 23 PCWP mean 7 Oxygen saturations: PA 75% AO 92% Cardiac Output(Fick) 8.94 Cardiac Index (Fick) 4.14 Cardiac Output (Thermo) 5.05 Cardiac Index  (Thermo) 2.34  02/2014 CT chest high res> mild pulm edema, very mild scattered subpleural reticulation, but assessment limited by likely pulmonary edema; 4mm nodule   Assessment & Plan:   ILD (interstitial lung disease) [name] [name] initially had some slight improvement in her shortness of breath while taking the higher dose of prednisone. However, this benefit has been far outweighed by severe symptoms of weight gain which has led to fatigue, weeping from her legs, and now of course dyspnea. She feels generally weak and she has had some ocular complaints related to the prednisone.  I explained to her today that I do not believe we should continue treating with prednisone because I do not think it has helped her. Using a steroid sparing agent is not a good idea considering their side effect profile when we don't know exactly what type of pulmonary pathology is afflicting her. She would not be able to tolerate an open lung biopsy.  While usual interstitial pneumonitis would be the most common etiology in her demographic, the CT scan did not seem consistent with this. We did briefly discuss the use of the new anti-fibrotic agents but she is not interested in these because of their side effect profiles and they would not make her feel better.  Plan: -We will slowly taper off prednisone over the course of the next 3 weeks -Continue oxygen as written -Continue nebulized steroids in the form of Pulmicort twice a day -Follow-up with me in 8 weeks   Chronic hypoxemic respiratory failure Continue 2.5 L at rest and 4 L at exertion.   DIASTOLIC HEART FAILURE, CHRONIC Even though her weight is up by 20 pounds I agree with Dr. Mariah Milling that much of this is in the form of adipose tissue. Her exam is always consistent with chronic volume overload but her recent blood work actually suggested a degree of over diuresis.  Plan: -Hopefully her weight will improve with stopping the prednisone -Continue heart failure  regimen as prescribed by Dr. Mariah Milling     Updated Medication List Outpatient Encounter Prescriptions as of 07/18/2014  Medication Sig  . allopurinol (ZYLOPRIM) 300 MG tablet Take 1 tablet (300 mg total) by mouth daily.  [name] amiodarone (PACERONE) 200 MG tablet Take 100 mg by mouth daily.   [name] aspirin 81 MG tablet Take 81 mg by mouth daily.  [name] atorvastatin (LIPITOR) 10 MG tablet Take 1 tablet (10 mg total) by mouth daily.  . budesonide (PULMICORT) 0.5 MG/2ML nebulizer solution Take 2 mLs (0.5 mg total) by nebulization 2 (two) times daily.  . cetirizine (ZYRTEC ALLERGY) 10 MG tablet Take 10 mg by mouth as needed.   . colchicine 0.6 MG tablet Take 0.6 mg by mouth as needed.  . diltiazem (CARDIZEM CD) 120 MG 24 hr capsule Take 1 capsule (120 mg total) by mouth daily.  [name] escitalopram (LEXAPRO) 20  MG tablet Take 1 tablet (20 mg total) by mouth daily.  . famotidine (PEPCID) 20 MG tablet Take 1 tablet (20 mg total) by mouth daily.  [name] gabapentin (NEURONTIN) 300 MG capsule Take 1 capsule (300 mg total) by mouth daily.  [name] levalbuterol (XOPENEX) 0.63 MG/3ML nebulizer solution Take 3 mLs (0.63 mg total) by nebulization 4 (four) times daily.  . potassium chloride SA (K-DUR,KLOR-CON) 20 MEQ tablet Take 20 mEq by mouth daily.  [name] torsemide (DEMADEX) 20 MG tablet Take 2 tablets (40 mg total) by mouth 2 (two) times daily.  [name] warfarin (COUMADIN) 2.5 MG tablet Take as directed.  . [DISCONTINUED] predniSONE (DELTASONE) 10 MG tablet  daily for two weeks,  daily for one month, then  daily until you see me  . predniSONE (DELTASONE) 5 MG tablet  daily for one week,  daily for one week,  daily for one week

## 2014-07-18 NOTE — Assessment & Plan Note (Signed)
Even though her weight is up by 20 pounds I agree with Dr. Mariah MillingGollan that much of this is in the form of adipose tissue. Her exam is always consistent with chronic volume overload but her recent blood work actually suggested a degree of over diuresis.  Plan: -Hopefully her weight will improve with stopping the prednisone -Continue heart failure regimen as prescribed by Dr. Mariah MillingGollan

## 2014-07-18 NOTE — Patient Instructions (Signed)
Take 15mg  prednisone daily  for one week, then 10mg  daily for one week, then 5mg  daily for one week, then stop Keep using your nebulizers and your oxygen as you are doing We will see you back in 8-10 weeks or sooner [facility]

## 2014-07-18 NOTE — Assessment & Plan Note (Signed)
Continue 2.5 L at rest and 4 L at exertion.

## 2014-07-18 NOTE — Assessment & Plan Note (Signed)
Darcell initially had some slight improvement in her shortness of breath while taking the higher dose of prednisone. However, this benefit has been far outweighed by severe symptoms of weight gain which has led to fatigue, weeping from her legs, and now of course dyspnea. She feels generally weak and she has had some ocular complaints related to the prednisone.  I explained to her today that I do not believe we should continue treating with prednisone because I do not think it has helped her. Using a steroid sparing agent is not a good idea considering their side effect profile when we don't know exactly what type of pulmonary pathology is afflicting her. She would not be able to tolerate an open lung biopsy.  While usual interstitial pneumonitis would be the most common etiology in her demographic, the CT scan did not seem consistent with this. We did briefly discuss the use of the new anti-fibrotic agents but she is not interested in these because of their side effect profiles and they would not make her feel better.  Plan: -We will slowly taper off prednisone over the course of the next 3 weeks -Continue oxygen as written -Continue nebulized steroids in the form of Pulmicort twice a day -Follow-up with me in 8 weeks

## 2014-07-21 ENCOUNTER — Telehealth: Payer: Self-pay

## 2014-07-21 ENCOUNTER — Telehealth: Payer: Self-pay | Admitting: *Deleted

## 2014-07-21 ENCOUNTER — Telehealth: Payer: Self-pay | Admitting: Pulmonary Disease

## 2014-07-21 NOTE — Telephone Encounter (Signed)
Per Marchelle FolksAmanda in CHF clinic, pt cancelled new pt appt in their office and did not wish to reschedule this appt.

## 2014-07-21 NOTE — Telephone Encounter (Signed)
Spoke with pt, states that her have started weeping. Pt states fluid will build up in pockets under the skin and will then either seep through the skin or burst. Pt notes that the it is clear d/c with some blood. Pt wanting to know what she should do.  Please advise Dr [name]. Thanks.

## 2014-07-21 NOTE — Telephone Encounter (Signed)
Needs to wean off prednisone as soon as possible Needs Ace wraps extending from the toes up to below the knee If this gets worse, will need wound clinic to place Unna boot Leg elevation on the bed with 3 pillows

## 2014-07-21 NOTE — Telephone Encounter (Signed)
From a medical standpoint, I think we need to stick to the plan to gradually come off of the prednisone.  Continue the dose of diuretics prescribed by Dr. Mariah MillingGollan. For the wounds, I recommend a wound care consult

## 2014-07-21 NOTE — Telephone Encounter (Signed)
Pt is calling stating that her leg is now letting out water.  Been going on for about two days to three days Has CHF we have her on fluid pills She is taking them  Suggested to patient she needs to reschedule her apt with CHF and she stated that she did not need them. But said to "forget it" and said thank you.  Please advise.

## 2014-07-21 NOTE — Telephone Encounter (Signed)
Pt is aware of BQ's recommendations. Nothing further was needed. 

## 2014-07-22 NOTE — Telephone Encounter (Signed)
Please see Dr. Ulyses JarredMcQuaid's phone note.

## 2014-07-27 ENCOUNTER — Telehealth: Payer: Self-pay | Admitting: Family Medicine

## 2014-07-27 NOTE — Telephone Encounter (Signed)
Per Dr Mariah MillingGollan goal 1.8-2.2 INR every 2 weeks .  PT at goal currently recheck in 2 weeks.

## 2014-07-27 NOTE — Telephone Encounter (Signed)
Larita FifeLynn called with patient's PT/INR results.  PT-22.5,INR-1.9. Please call Larita FifeLynn back with instructions.

## 2014-07-28 NOTE — Telephone Encounter (Signed)
Left message for [name] [name] that patient is at goal for PT/INR and to recheck in 2 weeks.

## 2014-07-29 ENCOUNTER — Telehealth: Payer: Self-pay | Admitting: Family Medicine

## 2014-07-29 NOTE — Telephone Encounter (Signed)
Patient advised.

## 2014-07-29 NOTE — Telephone Encounter (Signed)
TELEPHONE ADVICE RECORD [facility]  Patient Name: [name] [name]  DOB: 08/24/1931    Initial Comment Caller States wed. night she vomited. took her temp today its 100.6, having chills. thought it might be a 24hr virus but it has lasted longer than that.    Nurse Assessment  Nurse: Odis LusterBowers, RN, Bjorn Loserhonda Date/Time (Eastern Time): 07/29/2014 3:06:52 PM  Confirm and document reason for call. If symptomatic, describe symptoms. ---Caller States wed. night she vomited x3. took her temp today its 100.6, having chills. Denies diarrhea. Denies pain, but slight headache. Reports that she has been drinking her alloted amount of fluids, but she continue to be thirsty. Not a dialysis. Patient reports that she retains fluid due to CHF.  Has the patient traveled out of the country within the last 30 days? ---No  Does the patient require triage? ---Yes  Related visit to physician within the last 2 weeks? ---Yes   Reports she saw her pulmonologist a couple weeks ago and was placed on steroids. She continues to take the prednisone.  Does the PT have any chronic conditions? (i.e. diabetes, asthma, etc.) ---Yes  List chronic conditions. ---CHF; fibrosis of the lungs; hypertension;     Guidelines    Guideline Title Affirmed Question Affirmed Notes  Fever [1] Fever AND [2] no signs of serious infection or localizing symptoms (all other triage questions negative)    Final Disposition User   Home Care LynwoodBowers, RN, Bjorn Loserhonda

## 2014-07-29 NOTE — Telephone Encounter (Signed)
Let pt know. I agree with home care for now, but pt is very tenous with extensive PMH. If pt not improving as expected.. Have her make appt to be seen in office next week. If worse over weekend can be seen at Saturday urgent care.

## 2014-08-01 ENCOUNTER — Encounter (HOSPITAL_COMMUNITY): Payer: Self-pay | Admitting: Neurology

## 2014-08-01 ENCOUNTER — Emergency Department (HOSPITAL_COMMUNITY): Payer: Medicare Other

## 2014-08-01 ENCOUNTER — Inpatient Hospital Stay (HOSPITAL_COMMUNITY)
Admission: EM | Admit: 2014-08-01 | Discharge: 2014-08-07 | DRG: 291 | Disposition: A | Discharge status: Home / Self Care | Payer: Medicare Other | Attending: Internal Medicine | Admitting: Internal Medicine

## 2014-08-01 ENCOUNTER — Telehealth: Payer: Self-pay | Admitting: Pulmonary Disease

## 2014-08-01 DIAGNOSIS — I2781 Cor pulmonale (chronic): Secondary | ICD-10-CM | POA: Diagnosis present

## 2014-08-01 DIAGNOSIS — R0602 Shortness of breath: Secondary | ICD-10-CM | POA: Diagnosis present

## 2014-08-01 DIAGNOSIS — J189 Pneumonia, unspecified organism: Secondary | ICD-10-CM | POA: Diagnosis not present

## 2014-08-01 DIAGNOSIS — I482 Chronic atrial fibrillation: Secondary | ICD-10-CM | POA: Diagnosis present

## 2014-08-01 DIAGNOSIS — D649 Anemia, unspecified: Secondary | ICD-10-CM | POA: Diagnosis present

## 2014-08-01 DIAGNOSIS — I480 Paroxysmal atrial fibrillation: Secondary | ICD-10-CM | POA: Diagnosis present

## 2014-08-01 DIAGNOSIS — I5032 Chronic diastolic (congestive) heart failure: Secondary | ICD-10-CM

## 2014-08-01 DIAGNOSIS — N179 Acute kidney failure, unspecified: Secondary | ICD-10-CM | POA: Insufficient documentation

## 2014-08-01 DIAGNOSIS — I714 Abdominal aortic aneurysm, without rupture, unspecified: Secondary | ICD-10-CM | POA: Diagnosis present

## 2014-08-01 DIAGNOSIS — J129 Viral pneumonia, unspecified: Secondary | ICD-10-CM | POA: Diagnosis present

## 2014-08-01 DIAGNOSIS — I10 Essential (primary) hypertension: Secondary | ICD-10-CM | POA: Diagnosis present

## 2014-08-01 DIAGNOSIS — I481 Persistent atrial fibrillation: Secondary | ICD-10-CM | POA: Diagnosis not present

## 2014-08-01 DIAGNOSIS — Z9981 Dependence on supplemental oxygen: Secondary | ICD-10-CM

## 2014-08-01 DIAGNOSIS — Z86718 Personal history of other venous thrombosis and embolism: Secondary | ICD-10-CM

## 2014-08-01 DIAGNOSIS — T81718A Complication of other artery following a procedure, not elsewhere classified, initial encounter: Secondary | ICD-10-CM

## 2014-08-01 DIAGNOSIS — I272 Other secondary pulmonary hypertension: Secondary | ICD-10-CM | POA: Diagnosis present

## 2014-08-01 DIAGNOSIS — I5033 Acute on chronic diastolic (congestive) heart failure: Principal | ICD-10-CM | POA: Diagnosis present

## 2014-08-01 DIAGNOSIS — T502X5A Adverse effect of carbonic-anhydrase inhibitors, benzothiadiazides and other diuretics, initial encounter: Secondary | ICD-10-CM | POA: Diagnosis not present

## 2014-08-01 DIAGNOSIS — K219 Gastro-esophageal reflux disease without esophagitis: Secondary | ICD-10-CM | POA: Diagnosis present

## 2014-08-01 DIAGNOSIS — T380X5A Adverse effect of glucocorticoids and synthetic analogues, initial encounter: Secondary | ICD-10-CM | POA: Diagnosis not present

## 2014-08-01 DIAGNOSIS — Z6841 Body Mass Index (BMI) 40.0 and over, adult: Secondary | ICD-10-CM

## 2014-08-01 DIAGNOSIS — E876 Hypokalemia: Secondary | ICD-10-CM | POA: Diagnosis not present

## 2014-08-01 DIAGNOSIS — N183 Chronic kidney disease, stage 3 (moderate): Secondary | ICD-10-CM | POA: Diagnosis present

## 2014-08-01 DIAGNOSIS — Z9049 Acquired absence of other specified parts of digestive tract: Secondary | ICD-10-CM | POA: Diagnosis present

## 2014-08-01 DIAGNOSIS — J309 Allergic rhinitis, unspecified: Secondary | ICD-10-CM | POA: Diagnosis present

## 2014-08-01 DIAGNOSIS — I509 Heart failure, unspecified: Secondary | ICD-10-CM

## 2014-08-01 DIAGNOSIS — I2699 Other pulmonary embolism without acute cor pulmonale: Secondary | ICD-10-CM | POA: Diagnosis present

## 2014-08-01 DIAGNOSIS — I129 Hypertensive chronic kidney disease with stage 1 through stage 4 chronic kidney disease, or unspecified chronic kidney disease: Secondary | ICD-10-CM | POA: Diagnosis present

## 2014-08-01 DIAGNOSIS — M109 Gout, unspecified: Secondary | ICD-10-CM | POA: Diagnosis present

## 2014-08-01 DIAGNOSIS — E785 Hyperlipidemia, unspecified: Secondary | ICD-10-CM | POA: Diagnosis present

## 2014-08-01 DIAGNOSIS — J9621 Acute and chronic respiratory failure with hypoxia: Secondary | ICD-10-CM | POA: Diagnosis present

## 2014-08-01 DIAGNOSIS — Z7951 Long term (current) use of inhaled steroids: Secondary | ICD-10-CM

## 2014-08-01 DIAGNOSIS — Z7901 Long term (current) use of anticoagulants: Secondary | ICD-10-CM

## 2014-08-01 DIAGNOSIS — Z7982 Long term (current) use of aspirin: Secondary | ICD-10-CM

## 2014-08-01 DIAGNOSIS — Z86711 Personal history of pulmonary embolism: Secondary | ICD-10-CM | POA: Diagnosis present

## 2014-08-01 DIAGNOSIS — I2510 Atherosclerotic heart disease of native coronary artery without angina pectoris: Secondary | ICD-10-CM | POA: Diagnosis present

## 2014-08-01 DIAGNOSIS — E662 Morbid (severe) obesity with alveolar hypoventilation: Secondary | ICD-10-CM | POA: Diagnosis present

## 2014-08-01 DIAGNOSIS — J9620 Acute and chronic respiratory failure, unspecified whether with hypoxia or hypercapnia: Secondary | ICD-10-CM

## 2014-08-01 DIAGNOSIS — J849 Interstitial pulmonary disease, unspecified: Secondary | ICD-10-CM

## 2014-08-01 HISTORY — DX: Interstitial pulmonary disease, unspecified: J84.9

## 2014-08-01 HISTORY — DX: Hypoxemia: R09.02

## 2014-08-01 LAB — PROTIME-INR
INR: 1.53 — AB (ref 0.00–1.49)
Prothrombin Time: 18.5 seconds — ABNORMAL HIGH (ref 11.6–15.2)

## 2014-08-01 LAB — BASIC METABOLIC PANEL
Anion gap: 13 (ref 5–15)
BUN: 27 mg/dL — ABNORMAL HIGH (ref 6–23)
CO2: 29 mmol/L (ref 19–32)
Calcium: 9.2 mg/dL (ref 8.4–10.5)
Chloride: 100 mmol/L (ref 96–112)
Creatinine, Ser: 1.47 mg/dL — ABNORMAL HIGH (ref 0.50–1.10)
GFR calc Af Amer: 38 mL/min — ABNORMAL LOW (ref >90)
GFR, EST NON AFRICAN AMERICAN: 32 mL/min — AB (ref >90)
GLUCOSE: 107 mg/dL — AB (ref 70–99)
POTASSIUM: 3.8 mmol/L (ref 3.5–5.1)
Sodium: 142 mmol/L (ref 135–145)

## 2014-08-01 LAB — URINALYSIS, ROUTINE W REFLEX MICROSCOPIC
BILIRUBIN URINE: NEGATIVE
Glucose, UA: NEGATIVE mg/dL
Hgb urine dipstick: NEGATIVE
Ketones, ur: NEGATIVE mg/dL
Leukocytes, UA: NEGATIVE
Nitrite: NEGATIVE
Protein, ur: NEGATIVE mg/dL
SPECIFIC GRAVITY, URINE: 1.006 (ref 1.005–1.030)
Urobilinogen, UA: 0.2 mg/dL (ref 0.0–1.0)
pH: 7.5 (ref 5.0–8.0)

## 2014-08-01 LAB — CBC
HEMATOCRIT: 40.9 % (ref 36.0–46.0)
HEMOGLOBIN: 12.4 g/dL (ref 12.0–15.0)
MCH: 27.7 pg (ref 26.0–34.0)
MCHC: 30.3 g/dL (ref 30.0–36.0)
MCV: 91.3 fL (ref 78.0–100.0)
Platelets: 248 10*3/uL (ref 150–400)
RBC: 4.48 MIL/uL (ref 3.87–5.11)
RDW: 18.4 % — ABNORMAL HIGH (ref 11.5–15.5)
WBC: 14.2 10*3/uL — ABNORMAL HIGH (ref 4.0–10.5)

## 2014-08-01 LAB — I-STAT TROPONIN, ED: TROPONIN I, POC: 0.1 ng/mL — AB (ref 0.00–0.08)

## 2014-08-01 LAB — BRAIN NATRIURETIC PEPTIDE: B Natriuretic Peptide: 658.4 pg/mL — ABNORMAL HIGH (ref 0.0–100.0)

## 2014-08-01 MED ORDER — DILTIAZEM HCL ER COATED BEADS 120 MG PO CP24
120.0000 mg | ORAL_CAPSULE | Freq: Every day | ORAL | Status: DC
  Administered 2014-08-02 – 2014-08-03 (×2): 120 mg via ORAL
  Filled 2014-08-01 (×4): qty 1

## 2014-08-01 MED ORDER — IPRATROPIUM-ALBUTEROL 0.5-2.5 (3) MG/3ML IN SOLN
3.0000 mL | Freq: Once | RESPIRATORY_TRACT | Status: AC
  Administered 2014-08-01: 3 mL via RESPIRATORY_TRACT
  Filled 2014-08-01: qty 3

## 2014-08-01 MED ORDER — CEFTRIAXONE SODIUM IN DEXTROSE 20 MG/ML IV SOLN
1.0000 g | INTRAVENOUS | Status: DC
  Administered 2014-08-02 – 2014-08-03 (×2): 1 g via INTRAVENOUS
  Filled 2014-08-01 (×4): qty 50

## 2014-08-01 MED ORDER — DEXTROSE 5 % IV SOLN
500.0000 mg | INTRAVENOUS | Status: DC
  Administered 2014-08-02 – 2014-08-03 (×2): 500 mg via INTRAVENOUS
  Filled 2014-08-01 (×4): qty 500

## 2014-08-01 MED ORDER — WARFARIN SODIUM 5 MG PO TABS
5.0000 mg | ORAL_TABLET | Freq: Once | ORAL | Status: AC
  Administered 2014-08-02: 5 mg via ORAL
  Filled 2014-08-01: qty 1

## 2014-08-01 MED ORDER — COLCHICINE 0.6 MG PO TABS: 0.6000 mg | ORAL_TABLET | ORAL | Status: DC | PRN

## 2014-08-01 MED ORDER — HYDROCORTISONE NA SUCCINATE PF 100 MG IJ SOLR
50.0000 mg | Freq: Three times a day (TID) | INTRAMUSCULAR | Status: DC
  Administered 2014-08-02 (×2): 50 mg via INTRAVENOUS
  Filled 2014-08-01 (×5): qty 1

## 2014-08-01 MED ORDER — IPRATROPIUM BROMIDE 0.02 % IN SOLN
0.5000 mg | Freq: Once | RESPIRATORY_TRACT | Status: AC
  Administered 2014-08-01: 0.5 mg via RESPIRATORY_TRACT
  Filled 2014-08-01: qty 2.5

## 2014-08-01 MED ORDER — SODIUM CHLORIDE 0.9 % IV BOLUS (SEPSIS): 500.0000 mL | Freq: Once | INTRAVENOUS | Status: DC

## 2014-08-01 MED ORDER — ALLOPURINOL 300 MG PO TABS
300.0000 mg | ORAL_TABLET | Freq: Every day | ORAL | Status: DC
  Administered 2014-08-02 – 2014-08-07 (×6): 300 mg via ORAL
  Filled 2014-08-01 (×6): qty 1

## 2014-08-01 MED ORDER — WARFARIN - PHARMACIST DOSING INPATIENT: Freq: Every day | Status: DC

## 2014-08-01 MED ORDER — BUDESONIDE 0.5 MG/2ML IN SUSP
0.5000 mg | Freq: Two times a day (BID) | RESPIRATORY_TRACT | Status: DC
  Administered 2014-08-01 – 2014-08-07 (×11): 0.5 mg via RESPIRATORY_TRACT
  Filled 2014-08-01 (×15): qty 2

## 2014-08-01 MED ORDER — FUROSEMIDE 10 MG/ML IJ SOLN
80.0000 mg | INTRAMUSCULAR | Status: AC
  Administered 2014-08-01: 80 mg via INTRAVENOUS
  Filled 2014-08-01: qty 8

## 2014-08-01 MED ORDER — DEXTROSE 5 % IV SOLN
500.0000 mg | Freq: Once | INTRAVENOUS | Status: AC
  Administered 2014-08-01: 500 mg via INTRAVENOUS
  Filled 2014-08-01: qty 500

## 2014-08-01 MED ORDER — HEPARIN BOLUS VIA INFUSION
4000.0000 [IU] | Freq: Once | INTRAVENOUS | Status: AC
  Administered 2014-08-01: 4000 [IU] via INTRAVENOUS
  Filled 2014-08-01: qty 4000

## 2014-08-01 MED ORDER — GABAPENTIN 300 MG PO CAPS
300.0000 mg | ORAL_CAPSULE | Freq: Every day | ORAL | Status: DC
  Administered 2014-08-02 – 2014-08-07 (×6): 300 mg via ORAL
  Filled 2014-08-01 (×6): qty 1

## 2014-08-01 MED ORDER — ATORVASTATIN CALCIUM 10 MG PO TABS
10.0000 mg | ORAL_TABLET | Freq: Every day | ORAL | Status: DC
  Administered 2014-08-02 – 2014-08-07 (×6): 10 mg via ORAL
  Filled 2014-08-01 (×6): qty 1

## 2014-08-01 MED ORDER — HEPARIN SODIUM (PORCINE) 5000 UNIT/ML IJ SOLN: 5000.0000 [IU] | Freq: Three times a day (TID) | INTRAMUSCULAR | Status: DC

## 2014-08-01 MED ORDER — CEFTRIAXONE SODIUM 1 G IJ SOLR
1.0000 g | Freq: Once | INTRAMUSCULAR | Status: AC
  Administered 2014-08-01: 1 g via INTRAVENOUS
  Filled 2014-08-01: qty 10

## 2014-08-01 MED ORDER — LEVALBUTEROL HCL 0.63 MG/3ML IN NEBU
0.6300 mg | INHALATION_SOLUTION | Freq: Four times a day (QID) | RESPIRATORY_TRACT | Status: DC
  Administered 2014-08-02 – 2014-08-07 (×21): 0.63 mg via RESPIRATORY_TRACT
  Filled 2014-08-01 (×41): qty 3

## 2014-08-01 MED ORDER — ESCITALOPRAM OXALATE 20 MG PO TABS
20.0000 mg | ORAL_TABLET | Freq: Every day | ORAL | Status: DC
  Administered 2014-08-02 – 2014-08-07 (×6): 20 mg via ORAL
  Filled 2014-08-01 (×6): qty 1

## 2014-08-01 MED ORDER — LORATADINE 10 MG PO TABS
10.0000 mg | ORAL_TABLET | Freq: Every day | ORAL | Status: DC
  Administered 2014-08-02 – 2014-08-07 (×6): 10 mg via ORAL
  Filled 2014-08-01 (×6): qty 1

## 2014-08-01 MED ORDER — AMIODARONE HCL 100 MG PO TABS
100.0000 mg | ORAL_TABLET | Freq: Every day | ORAL | Status: DC
  Administered 2014-08-02: 100 mg via ORAL
  Filled 2014-08-01 (×2): qty 1

## 2014-08-01 MED ORDER — FAMOTIDINE 20 MG PO TABS
20.0000 mg | ORAL_TABLET | Freq: Every day | ORAL | Status: DC
  Administered 2014-08-02 – 2014-08-07 (×6): 20 mg via ORAL
  Filled 2014-08-01 (×6): qty 1

## 2014-08-01 MED ORDER — FUROSEMIDE 10 MG/ML IJ SOLN
80.0000 mg | Freq: Two times a day (BID) | INTRAMUSCULAR | Status: DC
  Administered 2014-08-02 – 2014-08-03 (×4): 80 mg via INTRAVENOUS
  Filled 2014-08-01 (×7): qty 8

## 2014-08-01 MED ORDER — ASPIRIN EC 81 MG PO TBEC
81.0000 mg | DELAYED_RELEASE_TABLET | Freq: Every day | ORAL | Status: DC
  Administered 2014-08-02 – 2014-08-07 (×6): 81 mg via ORAL
  Filled 2014-08-01 (×6): qty 1

## 2014-08-01 MED ORDER — HEPARIN (PORCINE) IN NACL 100-0.45 UNIT/ML-% IJ SOLN
1400.0000 [IU]/h | INTRAMUSCULAR | Status: AC
  Administered 2014-08-01: 1400 [IU]/h via INTRAVENOUS
  Filled 2014-08-01: qty 250

## 2014-08-01 NOTE — ED Notes (Signed)
i-stat Trop. Result shown to Dr. Fredderick PhenixBelfi

## 2014-08-01 NOTE — Telephone Encounter (Signed)
Called and spoke to pt. Informed pt of the recs per BQ. Pt's sats were 78% on 5lpm at rest. Informed pt how dangerous this is on her body and how she will need to be evaluated today. [facility] Called Larita FifeLynn and left a detailed message informing her pt went to ED and to call back if needed.

## 2014-08-01 NOTE — ED Notes (Signed)
Pt. SPO2 at 85% on 6L [place]. RN put pt. On venti mask at 50% O2. SPO2 goes up to 91%

## 2014-08-01 NOTE — ED Notes (Signed)
Report attempted 

## 2014-08-01 NOTE — Progress Notes (Signed)
ANTICOAGULATION CONSULT NOTE - Initial Consult  Pharmacy Consult for Warfarin and Heparin Indication: atrial fibrillation  No Known Allergies  Patient Measurements: Height: 5\' 5"  (165.1 cm) Weight: 284 lb (128.822 kg) IBW/kg (Calculated) : 57 Heparin Dosing Weight: 88 kg  Vital Signs: Temp: 98.3 F (36.8 C) (03/21 1822) Temp Source: Oral (03/21 1822) BP: 130/48 mmHg (03/21 2130) Pulse Rate: 67 (03/21 2130)  Labs:  Recent Labs  08/01/14 1850  HGB 12.4  HCT 40.9  PLT 248  LABPROT 18.5*  INR 1.53*  CREATININE 1.47*    Estimated Creatinine Clearance: 41.3 mL/min (by C-G formula based on Cr of 1.47).   Medical History: Past Medical History  Diagnosis Date  . Congestive heart failure, unspecified     a. ECHO 6/0: EF 55-60%, mild LVH, mildly dilated RV w. mildly dec fx, RVSP 67  . Chronic atrial fibrillation     failed cardioversion in past  -repeat DC-CV on October 5th, 2010  . Personal history of DVT (deep vein thrombosis) 2008  . Obstructive sleep apnea   . HTN (hypertension)   . Morbid obesity   . Shingles     with postherpetic neuraigia  . GERD (gastroesophageal reflux disease)   . Hyperlipidemia   . Meningitis 1950s  . Gallstones     s/p cholecystectomy  . CAD (coronary artery disease)     nonobstructive by cath 8/10 - LAD 40-50% w mild PAH mean 29 w PVR 3.2 [place]  . AAA (abdominal aortic aneurysm)     small  . PE (pulmonary embolism)     history of  . Anemia     Medications:   (Not in a hospital admission) Scheduled:   Infusions:  . azithromycin 500 mg (08/01/14 2119)    Assessment: [name] [name] with history of Afib presents with worsening SOB. Pharmacy is consulted to dose warfarin and heparin bridge for atrial fibrillation. INR on presentation is SUBtherapeutic at 1.5. CBC is wnl, sCr 1.5, BNP 658, Trop 0.1.  Dr. Clyde LundborgNiu reports that patient will have procedure in morning and will need to stop heparin drip around 2am.  PTA warfarin dose: 3.75mg   Tues, Thurs, Sun and 2.5mg  AODs.  Goal of Therapy:  INR 2-3 Heparin level 0.3-0.7 units/ml Monitor platelets by anticoagulation protocol: Yes   Plan:  Give 4000 units bolus x 1 Start heparin infusion at 1400 units/hr Check anti-Xa level in 8 hours and daily while on heparin Continue to monitor H&H and platelets  Warfarin 5mg  tonight x1 Daily INR/CBC Monitor s/sx of bleeding  Arlean Hoppingorey M. Newman PiesBall, PharmD Clinical Pharmacist Pager [phone] 08/01/2014,9:48 PM

## 2014-08-01 NOTE — ED Notes (Signed)
Per EMS- EMS called out to home for increasing SOB with exertion. Has hx of COPD, CHF. Reports crackles in lower lobes, oxygen 82% initially 5L. BP 147/73, HR 76 SR. Pt is a x 4. Reports is coming off prednisone dosing.

## 2014-08-01 NOTE — Telephone Encounter (Signed)
This is really tough to assess.  I don't think that IV or IM solumedrol is going to fix her because her case is so complicated.  She needs to be seen by someone. My recommendation is urgent care or see if she can be worked into clinic this afternoon.  If worse then ER. This sounds dangerous.  Please relay my concern, these are scary numbers.

## 2014-08-01 NOTE — ED Provider Notes (Signed)
CSN: [id number]     Arrival date & time 08/01/14  1812 History   First MD Initiated Contact with Patient 08/01/14 1838     Chief Complaint  Patient presents with  . Shortness of Breath   [name] [name] is a 81 y.o. female with a history of CHF, interstitial lung disease, atrial fibrillation, DVT, PE, and chronic hypoxia who presents to the ED complaining of worsening shortness of breath over the past week. Patient reports she uses 3 L oxygen via nasal cannula but today had increased to 5 L because she was more short of breath. Patient reports she is been on long-term prednisone taper by her pulmonologist and has noticed increasing weight over the past 2 months. She reports the amount of swelling in her legs is her normal currently. She reports having a subjective intermittent fever over the past week but none today. She reports that normal slight cough but no worsening cough with her symptoms. She has reported a slight sore throat. She reports using her albuterol inhaler today with some relief. In the ED currently she reports her breathing has improved and she is feeling slightly better. The patient denies productive cough, ear pain, rashes, chills, abdominal pain, nausea, diarrhea, chest pain or palpitations.  (Consider location/radiation/quality/duration/timing/severity/associated sxs/prior Treatment) HPI  Past Medical History  Diagnosis Date  . Congestive heart failure, unspecified     a. ECHO 6/0: EF 55-60%, mild LVH, mildly dilated RV w. mildly dec fx, RVSP 67  . Chronic atrial fibrillation     failed cardioversion in past  -repeat DC-CV on October 5th, 2010  . Personal history of DVT (deep vein thrombosis) 2008  . Obstructive sleep apnea   . HTN (hypertension)   . Morbid obesity   . Shingles     with postherpetic neuraigia  . GERD (gastroesophageal reflux disease)   . Hyperlipidemia   . Meningitis 1950s  . Gallstones     s/p cholecystectomy  . CAD (coronary artery disease)    nonobstructive by cath 8/10 - LAD 40-50% w mild PAH mean 29 w PVR 3.2 [place]  . AAA (abdominal aortic aneurysm)     small  . PE (pulmonary embolism)     history of  . Anemia    Past Surgical History  Procedure Laterality Date  . Appendectomy  1947  . Tonsillectomy  1946  . Hospitalized with meningitis at [facility]  1960  . Cholecystectomy    . Cardioversion  01/2007    x2  . Right heart catheterization N/A 02/28/2014    Procedure: RIGHT HEART CATH;  Surgeon: Laurey Morale, MD;  Location: [facility] CATH LAB;  Service: Cardiovascular;  Laterality: N/A;   Family History  Problem Relation Age of Onset  . Heart failure Mother   . Depression Mother   . Ovarian cancer Paternal Grandmother   . Heart disease Paternal Grandfather   . Obesity Other    History  Substance Use Topics  . Smoking status: Never Smoker   . Smokeless tobacco: Never Used  . Alcohol Use: Yes     Comment: occasional   OB History    No data available     Review of Systems  Constitutional: Positive for fever. Negative for chills.  HENT: Positive for sore throat. Negative for congestion and trouble swallowing.   Eyes: Negative for pain and visual disturbance.  Respiratory: Positive for cough, shortness of breath and wheezing.   Cardiovascular: Positive for leg swelling. Negative for chest pain and palpitations.  Gastrointestinal: Negative for nausea, vomiting, abdominal pain and diarrhea.  Genitourinary: Negative for dysuria and difficulty urinating.  Musculoskeletal: Negative for back pain and neck pain.  Skin: Negative for rash.  Neurological: Negative for headaches.      Allergies  Review of patient's allergies indicates no known allergies.  Home Medications   Prior to Admission medications   Medication Sig Start Date End Date Taking? Authorizing Provider  allopurinol (ZYLOPRIM) 300 MG tablet Take 1 tablet (300 mg total) by mouth daily. 06/08/14  Yes Amy Michelle NasutiE Bedsole, MD  amiodarone (PACERONE) 200 MG  tablet Take 100 mg by mouth daily.  04/16/14  Yes Historical Provider, MD  aspirin 81 MG tablet Take 81 mg by mouth daily.   Yes Historical Provider, MD  atorvastatin (LIPITOR) 10 MG tablet Take 1 tablet (10 mg total) by mouth daily. 07/18/14  Yes Amy E Bedsole, MD  budesonide (PULMICORT) 0.5 MG/2ML nebulizer solution Take 2 mLs (0.5 mg total) by nebulization 2 (two) times daily. 04/19/14  Yes Amy Michelle NasutiE Bedsole, MD  cetirizine (ZYRTEC ALLERGY) 10 MG tablet Take 10 mg by mouth as needed for allergies.    Yes Historical Provider, MD  diltiazem (CARDIZEM CD) 120 MG 24 hr capsule Take 1 capsule (120 mg total) by mouth daily. 07/18/14  Yes Amy E Bedsole, MD  escitalopram (LEXAPRO) 20 MG tablet Take 1 tablet (20 mg total) by mouth daily. 05/02/14  Yes Amy Michelle NasutiE Bedsole, MD  famotidine (PEPCID) 20 MG tablet Take 1 tablet (20 mg total) by mouth daily. 04/15/14  Yes Antonieta Ibaimothy J Gollan, MD  gabapentin (NEURONTIN) 300 MG capsule Take 1 capsule (300 mg total) by mouth daily. 04/19/14  Yes Amy Michelle NasutiE Bedsole, MD  levalbuterol (XOPENEX) 0.63 MG/3ML nebulizer solution Take 3 mLs (0.63 mg total) by nebulization 4 (four) times daily. 03/02/14  Yes Russella DarAllison L Ellis, NP  OVER THE COUNTER MEDICATION Inhale 1 application into the lungs at bedtime. USES BIPAP EVERY BEDTIME FOR SLEEP   Yes Historical Provider, MD  potassium chloride SA (K-DUR,KLOR-CON) 20 MEQ tablet Take 20 mEq by mouth daily.   Yes Historical Provider, MD  predniSONE (DELTASONE) 5 MG tablet 15mg  daily for one week, 10mg  daily for one week, 5mg  daily for one week 07/18/14  Yes Lupita Leashouglas B McQuaid, MD  torsemide (DEMADEX) 20 MG tablet Take 2 tablets (40 mg total) by mouth 2 (two) times daily. 07/06/14  Yes Antonieta Ibaimothy J Gollan, MD  warfarin (COUMADIN) 2.5 MG tablet Take as directed. Patient taking differently: Take 2.5-3.75 mg by mouth daily at 6 PM. Takes 1.5 tabs on Tues, Thurs and Sun Takes 1 tab all other days 06/27/14  Yes Amy E Ermalene SearingBedsole, MD  colchicine 0.6 MG tablet Take 0.6 mg by  mouth as needed (for flareup).  01/11/14   Historical Provider, MD   BP 149/65 mmHg  Pulse 77  Temp(Src) 97.2 F (36.2 C) (Axillary)  Resp 22  Ht 5\' 5"  (1.651 m)  Wt 292 lb 12.3 oz (132.8 kg)  BMI 48.72 kg/m2  SpO2 93% Physical Exam  Constitutional: She is oriented to person, place, and time. She appears well-developed and well-nourished. No distress.  HENT:  Head: Normocephalic and atraumatic.  Mouth/Throat: Oropharynx is clear and moist.  Eyes: Conjunctivae are normal. Pupils are equal, round, and reactive to light. Right eye exhibits no discharge. Left eye exhibits no discharge.  Neck: Neck supple.  Cardiovascular: Normal rate, regular rhythm, normal heart sounds and intact distal pulses.  Exam reveals no gallop and no friction  rub.   No murmur heard. Pulmonary/Chest: Effort normal. She has wheezes. She has no rales.  Rales in bilateral bases and slight wheezing noted to her right side. Oxygen saturation 88-93% on 5 L. Respiratory rate 20-24.   Abdominal: Soft. Bowel sounds are normal. She exhibits no distension. There is no tenderness.  Musculoskeletal: She exhibits no edema.  Lymphadenopathy:    She has no cervical adenopathy.  Neurological: She is alert and oriented to person, place, and time. Coordination normal.  Skin: Skin is warm and dry. No rash noted. She is not diaphoretic. No erythema. No pallor.  Psychiatric: She has a normal mood and affect. Her behavior is normal.  Nursing note and vitals reviewed.   ED Course  Procedures (including critical care time) Labs Review Labs Reviewed  CBC - Abnormal; Notable for the following:    WBC 14.2 (*)    RDW 18.4 (*)    All other components within normal limits  BASIC METABOLIC PANEL - Abnormal; Notable for the following:    Glucose, Bld 107 (*)    BUN 27 (*)    Creatinine, Ser 1.47 (*)    GFR calc non Af Amer 32 (*)    GFR calc Af Amer 38 (*)    All other components within normal limits  BRAIN NATRIURETIC PEPTIDE -  Abnormal; Notable for the following:    B Natriuretic Peptide 658.4 (*)    All other components within normal limits  PROTIME-INR - Abnormal; Notable for the following:    Prothrombin Time 18.5 (*)    INR 1.53 (*)    All other components within normal limits  TROPONIN I - Abnormal; Notable for the following:    Troponin I 0.11 (*)    All other components within normal limits  I-STAT TROPOININ, ED - Abnormal; Notable for the following:    Troponin i, poc 0.10 (*)    All other components within normal limits  RESPIRATORY VIRUS PANEL  CULTURE, BLOOD (ROUTINE X 2)  CULTURE, BLOOD (ROUTINE X 2)  CULTURE, EXPECTORATED SPUTUM-ASSESSMENT  GRAM STAIN  CREATININE, URINE, RANDOM  LACTIC ACID, PLASMA  STREP PNEUMONIAE URINARY ANTIGEN  URINALYSIS, ROUTINE W REFLEX MICROSCOPIC  PROTIME-INR  CBC  TROPONIN I  TROPONIN I  UREA NITROGEN, URINE  INFLUENZA PANEL BY PCR (TYPE A & B, H1N1)  LEGIONELLA ANTIGEN, URINE  BASIC METABOLIC PANEL  MAGNESIUM    Imaging Review Dg Chest Port 1 View  08/01/2014   CLINICAL DATA:  Shortness of breath  EXAM: PORTABLE CHEST - 1 VIEW  COMPARISON:  March 19, 2014  FINDINGS: The mediastinal contour is normal. The heart size is enlarged. The aorta is tortuous. There is increased bilateral pulmonary interstitium. Patchy consolidation of left lung base is noted. There is no pleural effusion. The visualized skeletal structures are stable.  IMPRESSION: Congestive heart failure.  Patchy consolidation of left lung base, superimposed pneumonia is not excluded.   Electronically Signed   By: Sherian Rein M.D.   On: 08/01/2014 19:18     EKG Interpretation   Date/Time:  Monday August 01 2014 18:18:28 EDT Ventricular Rate:  74 PR Interval:  167 QRS Duration: 91 QT Interval:  486 QTC Calculation: 539 R Axis:   -4 Text Interpretation:  Sinus rhythm Low voltage, precordial leads  Nonspecific T abnormalities, diffuse leads Prolonged QT interval Baseline  wander in lead(s)  I since last tracing no significant change Confirmed by  BELFI  MD, MELANIE ([id number]) on 08/01/2014 6:52:08 PM  MDM   Meds given in ED:  Medications  cefTRIAXone (ROCEPHIN) 1 g in dextrose 5 % 50 mL IVPB - Premix (not administered)  furosemide (LASIX) injection 80 mg (not administered)  atorvastatin (LIPITOR) tablet 10 mg (not administered)  diltiazem (CARDIZEM CD) 24 hr capsule 120 mg (not administered)  aspirin EC tablet 81 mg (not administered)  allopurinol (ZYLOPRIM) tablet 300 mg (not administered)  escitalopram (LEXAPRO) tablet 20 mg (not administered)  amiodarone (PACERONE) tablet 100 mg (not administered)  budesonide (PULMICORT) nebulizer solution 0.5 mg (0.5 mg Nebulization Given 08/01/14 2334)  gabapentin (NEURONTIN) capsule 300 mg (not administered)  famotidine (PEPCID) tablet 20 mg (not administered)  levalbuterol (XOPENEX) nebulizer solution 0.63 mg (0.63 mg Nebulization Not Given 08/01/14 2334)  loratadine (CLARITIN) tablet 10 mg (not administered)  azithromycin (ZITHROMAX) 500 mg in dextrose 5 % 250 mL IVPB (not administered)  hydrocortisone sodium succinate (SOLU-CORTEF) 100 MG injection 50 mg (50 mg Intravenous Given 08/02/14 0140)  Warfarin - Pharmacist Dosing Inpatient (not administered)  heparin ADULT infusion 100 units/mL (25000 units/250 mL) (1,400 Units/hr Intravenous New Bag/Given 08/01/14 2238)  ipratropium-albuterol (DUONEB) 0.5-2.5 (3) MG/3ML nebulizer solution 3 mL (3 mLs Nebulization Given 08/01/14 1939)  ipratropium (ATROVENT) nebulizer solution 0.5 mg (0.5 mg Nebulization Given 08/01/14 1939)  furosemide (LASIX) injection 80 mg (80 mg Intravenous Given 08/01/14 2035)  cefTRIAXone (ROCEPHIN) 1 g in dextrose 5 % 50 mL IVPB (0 g Intravenous Stopped 08/01/14 2119)  azithromycin (ZITHROMAX) 500 mg in dextrose 5 % 250 mL IVPB (0 mg Intravenous Stopped 08/01/14 2207)  warfarin (COUMADIN) tablet 5 mg (5 mg Oral Given 08/02/14 0033)  heparin bolus via infusion 4,000  Units (4,000 Units Intravenous Given 08/01/14 2200)    Current Discharge Medication List      Final diagnoses:  AKI (acute kidney injury)  #2: community acquired pneumonia.   This  is a 81 y.o. female with a history of CHF, interstitial lung disease, atrial fibrillation, DVT, PE, and chronic hypoxia who presents to the ED complaining of worsening shortness of breath over the past week. Patient reports she uses 3 L oxygen via nasal cannula but today had increased to 5 L because she was more short of breath. She denies any chest pain. Patient has noted a white count of 14.2. Her BMP shows a creatinine of 1.47. She has a BNP of 658. Her troponin is slightly positive at 0.10 however she is not complaining of any chest pain is likely related to her CHF. Her EKG is unchanged from previous. Chest x-ray shows congestive heart failure with patchy consolidation of left lung base, superimposed pneumonia is not excluded. Due to the patient complaining of fevers will go ahead and start treating her for community-acquired pneumonia as her last hospital stay was greater than 3 months ago. Patient given albuterol, and Atrovent breathing treatment in the ER. Patient started on Rocephin and azithromycin. Patient also given 80 mg of IV Lasix in the ED. Reevaluation patient reports feeling improved. Will consult for admission. Patient is in agreement with admission. Patient accepted for admission by Dr. Clyde Lundborg.     Everlene Farrier, PA-C 08/02/14 1610  Rolan Bucco, MD 08/02/14 1310

## 2014-08-01 NOTE — H&P (Signed)
[facility] History and Physical  [name] [name] ZHY:[id number] DOB: 1931/08/25 DOA: 08/01/2014  Referring physician: ED physician PCP: Kerby Nora, MD  Specialists:   Chief Complaint: worsening SOB  HPI: [name] [name] is a 81 y.o. female with history of hypertension, GERD, gout, atrial fibrillation on Coumadin, hx of meningitis, small AAA, anemia, AKD-II-III, dCHF, interstitial lung disease, hx of DVT and PE, and chronic hypoxia on 3.5 L oxygen at home, who presents with worsening shortness of breath.  Patient reports that she has chronic hypoxia, and is using home oxygen 3.5 L. She has interstitial lung disease and is being followed up by pulmonologist. She has been on prednisone tapering since January 2016. Her initial dose was 40 mg daily for 2 weeks. She reports that her shortness of breath has been progressively getting worse since her prednisone dose was tapering down from 40 mg daily. She is supposed to start 5 mg maintaining dose tomorrow per pulmonologist. Patient reports that in the past several days, her shortness of breath is getting much worse. She increased her oxygen dose to 5L without significant help. She has cough with clear mucus production. She also developed fever 100.6 at one time at home. She does not have chest pain. She has fever and chills. Reports that she is taking torsemide 40 mg twice a day for congestive heart failure, but her body weight has been gradually increasing by 20 pounds since 05/2014. Patient dose not have chest pain, abdominal pain, diarrhea, constipation, dysuria, urgency, frequency, hematuria, skin rashes. No unilateral weakness, numbness or tingling sensations. No vision change or hearing loss.  In ED, patient was found to have elevated troponin at 0.10, Cre up from baseline 1.0-1.27 to 1.47, BNP 658, temperature 98.3. WBC 14.2 (patient is on prednisone). Chest x-ray showed congestive heart failure changes with patchy left lung infiltration. Patient is  admitted to inpatient for further evaluation and treatment.  Review of Systems: As presented in the history of presenting illness, rest negative.  Where does patient live?  At home Can patient participate in ADLs? Yes  Allergy: No Known Allergies  Past Medical History  Diagnosis Date  . Congestive heart failure, unspecified     a. ECHO 6/0: EF 55-60%, mild LVH, mildly dilated RV w. mildly dec fx, RVSP 67  . Chronic atrial fibrillation     failed cardioversion in past  -repeat DC-CV on October 5th, 2010  . Personal history of DVT (deep vein thrombosis) 2008  . Obstructive sleep apnea   . HTN (hypertension)   . Morbid obesity   . Shingles     with postherpetic neuraigia  . GERD (gastroesophageal reflux disease)   . Hyperlipidemia   . Meningitis 1950s  . Gallstones     s/p cholecystectomy  . CAD (coronary artery disease)     nonobstructive by cath 8/10 - LAD 40-50% w mild PAH mean 29 w PVR 3.2 Woods  . AAA (abdominal aortic aneurysm)     small  . PE (pulmonary embolism)     history of  . Anemia     Past Surgical History  Procedure Laterality Date  . Appendectomy  1947  . Tonsillectomy  1946  . Hospitalized with meningitis at [place]  1960  . Cholecystectomy    . Cardioversion  01/2007    x2  . Right heart catheterization N/A 02/28/2014    Procedure: RIGHT HEART CATH;  Surgeon: Laurey Morale, MD;  Location: [facility] CATH LAB;  Service: Cardiovascular;  Laterality: N/A;  Social History:  reports that she has never smoked. She has never used smokeless tobacco. She reports that she drinks alcohol. She reports that she does not use illicit drugs.  Family History:  Family History  Problem Relation Age of Onset  . Heart failure Mother   . Depression Mother   . Ovarian cancer Paternal Grandmother   . Heart disease Paternal Grandfather   . Obesity Other      Prior to Admission medications   Medication Sig Start Date End Date Taking? Authorizing Provider  allopurinol  (ZYLOPRIM) 300 MG tablet Take 1 tablet (300 mg total) by mouth daily. 06/08/14  Yes Amy Michelle NasutiE Bedsole, MD  amiodarone (PACERONE) 200 MG tablet Take 100 mg by mouth daily.  04/16/14  Yes Historical Provider, MD  aspirin 81 MG tablet Take 81 mg by mouth daily.   Yes Historical Provider, MD  atorvastatin (LIPITOR) 10 MG tablet Take 1 tablet (10 mg total) by mouth daily. 07/18/14  Yes Amy E Bedsole, MD  budesonide (PULMICORT) 0.5 MG/2ML nebulizer solution Take 2 mLs (0.5 mg total) by nebulization 2 (two) times daily. 04/19/14  Yes Amy Michelle NasutiE Bedsole, MD  cetirizine (ZYRTEC ALLERGY) 10 MG tablet Take 10 mg by mouth as needed for allergies.    Yes Historical Provider, MD  diltiazem (CARDIZEM CD) 120 MG 24 hr capsule Take 1 capsule (120 mg total) by mouth daily. 07/18/14  Yes Amy E Bedsole, MD  escitalopram (LEXAPRO) 20 MG tablet Take 1 tablet (20 mg total) by mouth daily. 05/02/14  Yes Amy Michelle NasutiE Bedsole, MD  famotidine (PEPCID) 20 MG tablet Take 1 tablet (20 mg total) by mouth daily. 04/15/14  Yes Antonieta Ibaimothy J Gollan, MD  gabapentin (NEURONTIN) 300 MG capsule Take 1 capsule (300 mg total) by mouth daily. 04/19/14  Yes Amy Michelle NasutiE Bedsole, MD  levalbuterol (XOPENEX) 0.63 MG/3ML nebulizer solution Take 3 mLs (0.63 mg total) by nebulization 4 (four) times daily. 03/02/14  Yes Russella DarAllison L Ellis, NP  OVER THE COUNTER MEDICATION Inhale 1 application into the lungs at bedtime. USES BIPAP EVERY BEDTIME FOR SLEEP   Yes Historical Provider, MD  potassium chloride SA (K-DUR,KLOR-CON) 20 MEQ tablet Take 20 mEq by mouth daily.   Yes Historical Provider, MD  predniSONE (DELTASONE) 5 MG tablet 15mg  daily for one week, 10mg  daily for one week, 5mg  daily for one week 07/18/14  Yes Lupita Leashouglas B McQuaid, MD  torsemide (DEMADEX) 20 MG tablet Take 2 tablets (40 mg total) by mouth 2 (two) times daily. 07/06/14  Yes Antonieta Ibaimothy J Gollan, MD  warfarin (COUMADIN) 2.5 MG tablet Take as directed. Patient taking differently: Take 2.5-3.75 mg by mouth daily at 6 PM. Takes 1.5  tabs on Tues, Thurs and Sun Takes 1 tab all other days 06/27/14  Yes Amy E Ermalene SearingBedsole, MD  colchicine 0.6 MG tablet Take 0.6 mg by mouth as needed (for flareup).  01/11/14   Historical Provider, MD    Physical Exam: Filed Vitals:   08/01/14 2030 08/01/14 2130 08/01/14 2220 08/01/14 2223  BP: 144/54 130/48  149/65  Pulse: 69 67  77  Temp:    97.2 F (36.2 C)  TempSrc:    Axillary  Resp: 19 24  22   Height:  5\' 5"  (1.651 m)    Weight:  128.822 kg (284 lb)  132.8 kg (292 lb 12.3 oz)  SpO2: 83% 96% 93% 93%   General: Not in acute distress HEENT:       Eyes: PERRL, EOMI, no scleral icterus  ENT: No discharge from the ears and nose, no pharynx injection, no tonsillar enlargement.        Neck: No JVD, no bruit, no mass felt. Cardiac: S1/S2, RRR, No murmurs, No gallops or rubs Pulm: decreased air movement bilaterally. Fine crackles bilaterally and posteriorly Abd: Soft, nondistended, nontender, no rebound pain, no organomegaly, BS present Ext: 1+ leg edema bilaterally. 2+DP/PT pulse bilaterally. Right shin has serosanguineous fluid oozing. Musculoskeletal: No joint deformities, erythema, or stiffness, ROM full Skin: No rashes.  Neuro: Alert and oriented X3, cranial nerves II-XII grossly intact, muscle strength 5/5 in all extremeties, sensation to light touch intact.  Psych: Patient is not psychotic, no suicidal or hemocidal ideation.  Labs on Admission:  Basic Metabolic Panel:  Recent Labs Lab 08/01/14 1850  NA 142  K 3.8  CL 100  CO2 29  GLUCOSE 107*  BUN 27*  CREATININE 1.47*  CALCIUM 9.2   Liver Function Tests: No results for input(s): AST, ALT, ALKPHOS, BILITOT, PROT, ALBUMIN in the last 168 hours. No results for input(s): LIPASE, AMYLASE in the last 168 hours. No results for input(s): AMMONIA in the last 168 hours. CBC:  Recent Labs Lab 08/01/14 1850 08/02/14 0320  WBC 14.2* 12.5*  HGB 12.4 11.5*  HCT 40.9 37.8  MCV 91.3 91.1  PLT 248 249   Cardiac  Enzymes:  Recent Labs Lab 08/01/14 2309  TROPONINI 0.11*    BNP (last 3 results)  Recent Labs  08/01/14 1850  BNP 658.4*    ProBNP (last 3 results)  Recent Labs  02/22/14 0947 02/24/14 0332 03/07/14 0648  PROBNP 2319.0* 2410.0* 437.5    CBG: No results for input(s): GLUCAP in the last 168 hours.  Radiological Exams on Admission: Dg Chest Port 1 View  08/01/2014   CLINICAL DATA:  Shortness of breath  EXAM: PORTABLE CHEST - 1 VIEW  COMPARISON:  March 19, 2014  FINDINGS: The mediastinal contour is normal. The heart size is enlarged. The aorta is tortuous. There is increased bilateral pulmonary interstitium. Patchy consolidation of left lung base is noted. There is no pleural effusion. The visualized skeletal structures are stable.  IMPRESSION: Congestive heart failure.  Patchy consolidation of left lung base, superimposed pneumonia is not excluded.   Electronically Signed   By: Sherian Rein M.D.   On: 08/01/2014 19:18    EKG: Independently reviewed. Left axis deviation, nonspecific T-wave change, without significant change compared with previous EKG  Assessment/Plan Principal Problem:   SOB (shortness of breath) Active Problems:   OBESITY-MORBID (>100')   Essential hypertension   EMBOLISM & INFARCTION, IATROGENIC PULMONARY   Atrial fibrillation   DIASTOLIC HEART FAILURE, CHRONIC   Abdominal aortic aneurysm   Allergic rhinitis   CHF exacerbation   Hx pulmonary embolism   History of DVT (deep vein thrombosis)   CAP (community acquired pneumonia)   Interstitial lung disease  Shortness of breath: It is most likely due to multifactorial etiologies, including congestive heart failure (patient's body weight increased by 20 pounds, with elevated BNP), interstitial lung disease, and possible pneumonia as evidenced by chest x-ray with patchy infiltration. Currently patient is hemodynamically stable, not septic. - Admit to telemetry bed and treat underlying problems - IV  lasix 80 mg bid for CHF exacerbation - check BMP and Mg in AM - Treated for CAP with IV ceftriaxone and azithromycin, follow up blood culture, sputum culture, respiratory virus panel, flu pcr, Legionella antigen and strep antigen - Stress dose of steroid: Solu Cortef 50 mg every  8 hours, which should also help with the interstitial lung disease. - Continue Pulmicort, DuoNeb nebulizer, Xopenex nebulizer, - check Lactic acid level  Interstitial lung disease: followed up by pulmonologist, Dr. Hazle Quant. Patient is on long-time prednisone taper since January. She is supposed to start maintaining dose of prednisone tomorrow. -see above -May let dr. Hazle Quant know that patient is in hospital in AM  Diastolic congestive heart failure: 2-D echo on 02/23/14 showed EF 60-65% with a grade 1 diastolic dysfunction. Patient is on torsemide 40 mg twice a day at home. She has exacerbated CHF on admission. BNP is 658, body weight increased to 20 pounds. -switch to IV lasix 80 mg bid as above  AoCKD-III: Baseline creatinine is 1.0-1.27. Her creatinine is 1.47 on admission. Urinalysis is negative on admission. Likely due to poor renal perfusion due to exacerbation of CHF. -started IV lasix for CHF exacerbation, with understanding that this may worsen her renal function. However, I am hoping this will not happen due to the Starling mechanism. Her kidney may be better perfused with improved cardiac function. -Check of FeUrea and US-renal -follow up renal Fx by BMP  Elevated trop: 0.10. It is most likely due to congestive heart failure exacerbation and stress. Patient does not have chest pain. EKG has no significant change compared with previous EKG. -trop x 3 -repeat EKG -continue aspirin, Lipitor  Atrial Fibrillation: CHA2DS2-VASc Score is 5, need oral anticoagulation. Patient is on coumadin at home. INR is 1.53 on admission, subtherapeutic. -will start Heparin bridging given high CHADs score -continue coumadin per  pharmacy -Continue amiodarone, Cardizem  HTN:  -On Lasix, Cardizem  Hyperlipidemia: LDL was 44 on 03/03/14 -Continue Lipitor  Remote history of DVT and pulmonary embolism: 6 years ago which was treated. No signs of new DVT -Patient is on Coumadin and IV heparin bridging  Right shin oozing lesion: -consult to wound care team   DVT ppx: iv Heparin, also on Coumadin with INR=1.53, need to d/c heparin when INR BECOMES THERAPEUTIC Code Status: Full code Family Communication:  Yes, patient's  daughter     at bed side Disposition Plan: Admit to inpatient   Date of Service 08/02/2014    Lorretta Harp [facility] Pager [phone]  If 7PM-7AM, please contact night-coverage [email or web address] Password [facility] 08/02/2014, 4:27 AM

## 2014-08-01 NOTE — ED Notes (Signed)
RT at bedside for breathing tx.

## 2014-08-01 NOTE — Telephone Encounter (Signed)
[place] and [place], [facility] RN. Larita FifeLynn stated pt's sats were 69% at rest on 3lpm O2. Pt is A&O x4. Pt's O2 was was increase to 5lpm and was given neb, sats increased to 82%. Pts respiratory rate was 24-25 breaths per minute. Pt 100% refused to go to ED. Pt stated she would rather be treated at home. Pt is tapering off pred, pt down to 5mg . Larita FifeLynn stated pt was doing well at 20mg  per day. Larita FifeLynn requesting since pt is refusing to go to ED- can an order be placed for IV or IM solumedrol. Pt denies cough, CP/tightness. Pt stated she has had recent chills and had an episode of emesis last week (pt stated this is very uncommon for her). Pt requesting recs from BQ only. Pt aware BQ will not be reachable till 3pm and to seek emergency care if she worsens-pt's son at home with her.   BQ please advise.   No Known Allergies

## 2014-08-02 ENCOUNTER — Encounter (HOSPITAL_COMMUNITY): Payer: Self-pay | Admitting: General Practice

## 2014-08-02 DIAGNOSIS — M109 Gout, unspecified: Secondary | ICD-10-CM | POA: Diagnosis present

## 2014-08-02 DIAGNOSIS — Z9049 Acquired absence of other specified parts of digestive tract: Secondary | ICD-10-CM | POA: Diagnosis present

## 2014-08-02 DIAGNOSIS — I5033 Acute on chronic diastolic (congestive) heart failure: Secondary | ICD-10-CM | POA: Diagnosis present

## 2014-08-02 DIAGNOSIS — R0602 Shortness of breath: Secondary | ICD-10-CM | POA: Diagnosis present

## 2014-08-02 DIAGNOSIS — J9621 Acute and chronic respiratory failure with hypoxia: Secondary | ICD-10-CM

## 2014-08-02 DIAGNOSIS — I129 Hypertensive chronic kidney disease with stage 1 through stage 4 chronic kidney disease, or unspecified chronic kidney disease: Secondary | ICD-10-CM | POA: Diagnosis present

## 2014-08-02 DIAGNOSIS — K219 Gastro-esophageal reflux disease without esophagitis: Secondary | ICD-10-CM | POA: Diagnosis present

## 2014-08-02 DIAGNOSIS — E876 Hypokalemia: Secondary | ICD-10-CM | POA: Diagnosis not present

## 2014-08-02 DIAGNOSIS — N179 Acute kidney failure, unspecified: Secondary | ICD-10-CM | POA: Diagnosis not present

## 2014-08-02 DIAGNOSIS — I272 Other secondary pulmonary hypertension: Secondary | ICD-10-CM | POA: Diagnosis present

## 2014-08-02 DIAGNOSIS — E785 Hyperlipidemia, unspecified: Secondary | ICD-10-CM | POA: Diagnosis present

## 2014-08-02 DIAGNOSIS — I2510 Atherosclerotic heart disease of native coronary artery without angina pectoris: Secondary | ICD-10-CM | POA: Diagnosis present

## 2014-08-02 DIAGNOSIS — J189 Pneumonia, unspecified organism: Secondary | ICD-10-CM | POA: Diagnosis not present

## 2014-08-02 DIAGNOSIS — T380X5A Adverse effect of glucocorticoids and synthetic analogues, initial encounter: Secondary | ICD-10-CM | POA: Diagnosis not present

## 2014-08-02 DIAGNOSIS — Z7982 Long term (current) use of aspirin: Secondary | ICD-10-CM | POA: Diagnosis not present

## 2014-08-02 DIAGNOSIS — Z7951 Long term (current) use of inhaled steroids: Secondary | ICD-10-CM | POA: Diagnosis not present

## 2014-08-02 DIAGNOSIS — J9620 Acute and chronic respiratory failure, unspecified whether with hypoxia or hypercapnia: Secondary | ICD-10-CM

## 2014-08-02 DIAGNOSIS — J9601 Acute respiratory failure with hypoxia: Secondary | ICD-10-CM | POA: Diagnosis not present

## 2014-08-02 DIAGNOSIS — N183 Chronic kidney disease, stage 3 (moderate): Secondary | ICD-10-CM | POA: Diagnosis present

## 2014-08-02 DIAGNOSIS — I482 Chronic atrial fibrillation: Secondary | ICD-10-CM | POA: Diagnosis present

## 2014-08-02 DIAGNOSIS — J849 Interstitial pulmonary disease, unspecified: Secondary | ICD-10-CM | POA: Diagnosis not present

## 2014-08-02 DIAGNOSIS — I2781 Cor pulmonale (chronic): Secondary | ICD-10-CM | POA: Diagnosis present

## 2014-08-02 DIAGNOSIS — Z7901 Long term (current) use of anticoagulants: Secondary | ICD-10-CM | POA: Diagnosis not present

## 2014-08-02 DIAGNOSIS — I480 Paroxysmal atrial fibrillation: Secondary | ICD-10-CM | POA: Diagnosis not present

## 2014-08-02 DIAGNOSIS — E662 Morbid (severe) obesity with alveolar hypoventilation: Secondary | ICD-10-CM | POA: Diagnosis present

## 2014-08-02 DIAGNOSIS — T502X5A Adverse effect of carbonic-anhydrase inhibitors, benzothiadiazides and other diuretics, initial encounter: Secondary | ICD-10-CM | POA: Diagnosis not present

## 2014-08-02 DIAGNOSIS — D649 Anemia, unspecified: Secondary | ICD-10-CM | POA: Diagnosis present

## 2014-08-02 DIAGNOSIS — J129 Viral pneumonia, unspecified: Secondary | ICD-10-CM | POA: Diagnosis present

## 2014-08-02 DIAGNOSIS — Z9981 Dependence on supplemental oxygen: Secondary | ICD-10-CM | POA: Diagnosis not present

## 2014-08-02 DIAGNOSIS — Z6841 Body Mass Index (BMI) 40.0 and over, adult: Secondary | ICD-10-CM | POA: Diagnosis not present

## 2014-08-02 DIAGNOSIS — Z86711 Personal history of pulmonary embolism: Secondary | ICD-10-CM | POA: Diagnosis not present

## 2014-08-02 DIAGNOSIS — Z86718 Personal history of other venous thrombosis and embolism: Secondary | ICD-10-CM | POA: Diagnosis not present

## 2014-08-02 LAB — CREATININE, URINE, RANDOM: Creatinine, Urine: 19.74 mg/dL

## 2014-08-02 LAB — INFLUENZA PANEL BY PCR (TYPE A & B)
H1N1 flu by pcr: NOT DETECTED
Influenza A By PCR: NEGATIVE
Influenza B By PCR: NEGATIVE

## 2014-08-02 LAB — CBC
HCT: 37.8 % (ref 36.0–46.0)
HEMOGLOBIN: 11.5 g/dL — AB (ref 12.0–15.0)
MCH: 27.7 pg (ref 26.0–34.0)
MCHC: 30.4 g/dL (ref 30.0–36.0)
MCV: 91.1 fL (ref 78.0–100.0)
PLATELETS: 249 10*3/uL (ref 150–400)
RBC: 4.15 MIL/uL (ref 3.87–5.11)
RDW: 18.3 % — ABNORMAL HIGH (ref 11.5–15.5)
WBC: 12.5 10*3/uL — ABNORMAL HIGH (ref 4.0–10.5)

## 2014-08-02 LAB — LEGIONELLA ANTIGEN, URINE

## 2014-08-02 LAB — MAGNESIUM: Magnesium: 2.2 mg/dL (ref 1.5–2.5)

## 2014-08-02 LAB — BASIC METABOLIC PANEL
Anion gap: 12 (ref 5–15)
BUN: 25 mg/dL — ABNORMAL HIGH (ref 6–23)
CALCIUM: 9.1 mg/dL (ref 8.4–10.5)
CHLORIDE: 100 mmol/L (ref 96–112)
CO2: 34 mmol/L — ABNORMAL HIGH (ref 19–32)
Creatinine, Ser: 1.32 mg/dL — ABNORMAL HIGH (ref 0.50–1.10)
GFR, EST AFRICAN AMERICAN: 43 mL/min — AB (ref >90)
GFR, EST NON AFRICAN AMERICAN: 37 mL/min — AB (ref >90)
GLUCOSE: 102 mg/dL — AB (ref 70–99)
POTASSIUM: 3.4 mmol/L — AB (ref 3.5–5.1)
Sodium: 146 mmol/L — ABNORMAL HIGH (ref 135–145)

## 2014-08-02 LAB — HEPARIN LEVEL (UNFRACTIONATED): Heparin Unfractionated: 0.3 IU/mL (ref 0.30–0.70)

## 2014-08-02 LAB — TROPONIN I
TROPONIN I: 0.11 ng/mL — AB (ref <0.031)
TROPONIN I: 0.06 ng/mL — AB (ref <0.031)
Troponin I: 0.12 ng/mL — ABNORMAL HIGH (ref <0.031)

## 2014-08-02 LAB — PROTIME-INR
INR: 1.64 — AB (ref 0.00–1.49)
PROTHROMBIN TIME: 19.6 s — AB (ref 11.6–15.2)

## 2014-08-02 LAB — LACTIC ACID, PLASMA: Lactic Acid, Venous: 1.8 mmol/L (ref 0.5–2.0)

## 2014-08-02 LAB — MRSA PCR SCREENING: MRSA BY PCR: POSITIVE — AB

## 2014-08-02 LAB — STREP PNEUMONIAE URINARY ANTIGEN: STREP PNEUMO URINARY ANTIGEN: NEGATIVE

## 2014-08-02 MED ORDER — OSELTAMIVIR PHOSPHATE 75 MG PO CAPS
75.0000 mg | ORAL_CAPSULE | Freq: Two times a day (BID) | ORAL | Status: DC
  Filled 2014-08-02 (×2): qty 1

## 2014-08-02 MED ORDER — CETYLPYRIDINIUM CHLORIDE 0.05 % MT LIQD
7.0000 mL | Freq: Two times a day (BID) | OROMUCOSAL | Status: DC
  Administered 2014-08-02 – 2014-08-07 (×9): 7 mL via OROMUCOSAL

## 2014-08-02 MED ORDER — WARFARIN SODIUM 5 MG PO TABS
5.0000 mg | ORAL_TABLET | Freq: Once | ORAL | Status: AC
  Administered 2014-08-02: 5 mg via ORAL
  Filled 2014-08-02: qty 1

## 2014-08-02 MED ORDER — POTASSIUM CHLORIDE CRYS ER 20 MEQ PO TBCR
40.0000 meq | EXTENDED_RELEASE_TABLET | Freq: Two times a day (BID) | ORAL | Status: AC
  Administered 2014-08-02 (×2): 40 meq via ORAL
  Filled 2014-08-02 (×2): qty 2

## 2014-08-02 MED ORDER — HEPARIN (PORCINE) IN NACL 100-0.45 UNIT/ML-% IJ SOLN
1650.0000 [IU]/h | INTRAMUSCULAR | Status: DC
Start: 2014-08-02 — End: 2014-08-05
  Administered 2014-08-02: 1400 [IU]/h via INTRAVENOUS
  Administered 2014-08-03: 1500 [IU]/h via INTRAVENOUS
  Administered 2014-08-03 – 2014-08-04 (×2): 1650 [IU]/h via INTRAVENOUS
  Filled 2014-08-02 (×10): qty 250

## 2014-08-02 MED ORDER — OSELTAMIVIR PHOSPHATE 30 MG PO CAPS
30.0000 mg | ORAL_CAPSULE | Freq: Two times a day (BID) | ORAL | Status: DC
  Administered 2014-08-02: 30 mg via ORAL
  Filled 2014-08-02 (×5): qty 1

## 2014-08-02 MED ORDER — PREDNISONE 20 MG PO TABS
20.0000 mg | ORAL_TABLET | Freq: Every day | ORAL | Status: DC
  Administered 2014-08-03: 20 mg via ORAL
  Filled 2014-08-02 (×3): qty 1

## 2014-08-02 NOTE — Care Management Note (Addendum)
    Page 1 of 1   08/04/2014     10:15:15 AM CARE MANAGEMENT NOTE 08/04/2014  Patient:  [name] [name],[name]   Account Number:  [id number]  Date Initiated:  08/02/2014  Documentation initiated by:  Junius CreamerWELL,DEBBIE  Subjective/Objective Assessment:   adm w resp distress     Action/Plan:   lives w fam, pcp dr amy bedsole, act w [facility]   Anticipated DC Date:     Anticipated DC Plan:  HOME W HOME HEALTH SERVICES         [facility]  Resumption Of Svcs/PTA Provider   Choice offered to / List presented to:          [facility]  [facility]      [facility]   Status of service:   Medicare Important Message given?  YES (If response is "NO", the following Medicare IM given date fields will be blank) Date Medicare IM given:  08/04/2014 Medicare IM given by:  Junius CreamerWELL,DEBBIE Date Additional Medicare IM given:   Additional Medicare IM given by:    Discharge Disposition:    Per UR Regulation:  Reviewed for med. necessity/level of care/duration of stay  If discussed at Long Length of Stay Meetings, dates discussed:    Comments:

## 2014-08-02 NOTE — Progress Notes (Signed)
Pt requesting to take off bipap so she can eat breakfast.  Pt took off bipap while RN was getting [place] for pt.  Pts oxygen sats quickly went down to the 60's, pt placed on ventri mask with no relief.  Rapid response, MD, and respiratory called.  Nonrebreather placed on pt and oxygen sats returned to upper 90's and ventri mask 50% placed back on pt.  Order placed for transfer to stepdown, will continue to monitor.

## 2014-08-02 NOTE — Progress Notes (Signed)
OT Cancellation Note  Patient Details Name: [name] [name] MRN: [id number] DOB: 05/28/1931   Cancelled Treatment:    Reason Eval/Treat Not Completed: Medical issues which prohibited therapy   Noted order for transfer to stepdown (now on BiPAP, elevated troponin). Will reattempt evaluation 3/23 if appropriate  Earlie RavelingStraub, Renley Gutman L OTR/L [phone] 08/02/2014, 8:21 AM

## 2014-08-02 NOTE — Consult Note (Addendum)
WOC wound consult note Reason for Consult: Consult requested for right leg wound.  Pt states extensive full thickness wound to right calf occurred several years ago and she was followed by the outpatient wound care center.  This area has healed and is pink dry intact scar tissue. Pt is well-informed regarding wound. Wound type: Few patchy areas of dry scabs occur periodically, according to patient.  Currently, there are no open wounds or drainage which require topical treatment. Measurement: Scabs are all less than .2X.2cm and are dark red and dry. Dressing procedure/placement/frequency: Lotion Q day to provide moisture to dry, scabbed areas.  If drainage begins to occur, then foam dressings can be applied to absorb and protect. Discussed plan of care with patient and she denies further questions. Please re-consult if further assistance is needed.  Thank-you,  Cammie Mcgeeawn Gethsemane Fischler MSN, RN, CWOCN, [place]ondWCN-AP, CNS [phone]

## 2014-08-02 NOTE — Progress Notes (Signed)
ANTICOAGULATION CONSULT NOTE  Pharmacy Consult for Warfarin and Heparin Indication: atrial fibrillation  No Known Allergies  Patient Measurements: Height: 5\' 4"  (162.6 cm) Weight: 292 lb 5.3 oz (132.6 kg) IBW/kg (Calculated) : 54.7 Heparin Dosing Weight: 88 kg  Vital Signs: Temp: 98 F (36.7 C) (03/22 1055) Temp Source: Oral (03/22 1055) BP: 145/68 mmHg (03/22 1245) Pulse Rate: 74 (03/22 1245)  Labs:  Recent Labs  08/01/14 1850 08/01/14 2309 08/02/14 0320 08/02/14 1030 08/02/14 1310  HGB 12.4  --  11.5*  --   --   HCT 40.9  --  37.8  --   --   PLT 248  --  249  --   --   LABPROT 18.5*  --  19.6*  --   --   INR 1.53*  --  1.64*  --   --   HEPARINUNFRC  --   --   --   --  0.30  CREATININE 1.47*  --  1.32*  --   --   TROPONINI  --  0.11* 0.12* 0.06*  --     Estimated Creatinine Clearance: 46.1 mL/min (by C-G formula based on Cr of 1.32).  Medical History: Past Medical History  Diagnosis Date  . Congestive heart failure, unspecified     a. ECHO 6/0: EF 55-60%, mild LVH, mildly dilated RV w. mildly dec fx, RVSP 67  . Chronic atrial fibrillation     failed cardioversion in past  -repeat DC-CV on October 5th, 2010  . Personal history of DVT (deep vein thrombosis) 2008  . Obstructive sleep apnea   . HTN (hypertension)   . Morbid obesity   . Shingles     with postherpetic neuraigia  . GERD (gastroesophageal reflux disease)   . Hyperlipidemia   . Meningitis 1950s  . Gallstones     s/p cholecystectomy  . CAD (coronary artery disease)     nonobstructive by cath 8/10 - LAD 40-50% w mild PAH mean 29 w PVR 3.2 Woods  . AAA (abdominal aortic aneurysm)     small  . PE (pulmonary embolism)     history of  . Anemia   . Shortness of breath dyspnea   . Chronic kidney disease     ckd stage 2  . Lung interstitial disease   . Hypoxemia    Infusions:  . heparin 1,400 Units/hr (08/02/14 1223)    Assessment: 81yo female with history of Afib presents with worsening  SOB. Pharmacy is consulted to dose warfarin and heparin bridge for atrial fibrillation. INR on presentation is SUBtherapeutic at 1.5. CBC is wnl, sCr 1.5, BNP 658, Trop 0.1.  No procedure was done today and heparin drip although it fell off MAR was never interrupted by nursing. Heparin level check found to be within therapeutic range at 0.3 roughtly 15 hours after drip started so will not recheck confirmatory level tonight.  INR trending up to 1.64. No bleeding issues noted overnight.  PTA warfarin dose: 3.75mg  Tues, Thurs, Sun and 2.5mg  AODs.  Goal of Therapy:  INR 2-3 Heparin level 0.3-0.7 units/ml Monitor platelets by anticoagulation protocol: Yes   Plan:  Increase heparin infusion to 1500 units/hr Check anti-Xa level daily while on heparin Continue to monitor H&H and platelets  Warfarin 5mg  tonight x1 Daily INR/CBC Monitor s/sx of bleeding  Sheppard CoilFrank Teryn Boerema PharmD., BCPS Clinical Pharmacist Pager [phone] 08/02/2014 2:21 PM

## 2014-08-02 NOTE — Progress Notes (Signed)
[facility]  Patient Status: Active (receiving services up to time of hospitalization)  [name] is providing the following services: RN  If patient discharges after hours, please call [phone].   [name] [name] 08/02/2014, 11:14 AM

## 2014-08-02 NOTE — Progress Notes (Signed)
[facility] PROGRESS NOTE  [name] [name] ZOX:[id number] DOB: [birth year] DOA: 08/01/2014 PCP: [name] Nora, MD  Assessment/Plan: 1-Acute on Chronic Respiratory Failure; Multifactorial: ILD, Pulmonary HTN, Diastolic HF. PNA.  Patient with hypoxemia, Oxygen 90 on VM at 50%. Now on BIPAP.  Transfer to Step Down unit.  Continue with IV lasix, IV antibiotics, Nebulizer treatments, IV steroids.  CCM consulted.   2-PNA; chest x ray with patchy consolidation Left Lung base.  Continue with IV ceftriaxone and azithro.  Influenza and respiratory panel pending.  Strep Pneumonia negative. Legionella pending.  Start tamiflu empirically.   3-Acute on CKD stage II; Monitor on IV lasix. Hold on getting renal US right now,.   Diastolic congestive heart failure: 2-D echo on 02/23/14 showed EF 60-65% with [name] grade 1 diastolic dysfunction. Patient is on torsemide 40 mg twice [name] day at home. She has exacerbated CHF on admission. BNP is 658, body weight increased to 20 pounds. - IV lasix 80 mg bid  Elevated trop: 0.10. It is most likely due to congestive heart failure exacerbation and stress. Patient does not have chest pain. EKG has no significant change compared with previous EKG. -trop x 3 -repeat EKG -continue aspirin, Lipitor -ECHO ordered.   Atrial Fibrillation: CHA2DS2-VASc Score is 5, need oral anticoagulation. Patient is on coumadin at home. INR is 1.53 on admission, subtherapeutic. -Heparin bridging given high CHADs score -continue coumadin per pharmacy -Continue amiodarone, Cardizem  Remote history of DVT and pulmonary embolism: 6 years ago which was treated. No signs of new DVT -Patient is on Coumadin and IV heparin bridging  Right shin oozing lesion: -consult to wound care team  Code Status: Full code.  Family Communication: Care discussed with patient.  Disposition Plan: Transfer to step down might need intermittent BIPAP.    Consultants:  CCM  Procedures:  ECHO; pending.    Antibiotics:  Ceftriaxone 3-21  Azithromycin 3-21   HPI/Subjective: Alert, on BIPAP, breathing ok.  She gain 20 pounds since January   Objective: Filed Vitals:   08/02/14 0511  BP:   Pulse: 73  Temp:   Resp: 18    Intake/Output Summary (Last 24 hours) at 08/02/14 0750 Last data filed at 08/02/14 8119  Gross per 24 hour  Intake 893.13 ml  Output   1225 ml  Net -331.87 ml   Filed Weights   08/01/14 2130 08/01/14 2223 08/02/14 0447  Weight: 128.822 kg (284 lb) 132.8 kg (292 lb 12.3 oz) 132.6 kg (292 lb 5.3 oz)    Exam:   General:  Alert on BIPAP, answering questions.   Cardiovascular: S 1, S 2 RRR  Respiratory: Bilateral Air movement, no wheezing, fine crackles.   Abdomen: Bs present, soft, NT  Musculoskeletal: trace edema.   Data Reviewed: Basic Metabolic Panel:  Recent Labs Lab 08/01/14 1850 08/02/14 0320  NA 142 146*  K 3.8 3.4*  CL 100 100  CO2 29 34*  GLUCOSE 107* 102*  BUN 27* 25*  CREATININE 1.47* 1.32*  CALCIUM 9.2 9.1  MG  --  2.2   Liver Function Tests: No results for input(s): AST, ALT, ALKPHOS, BILITOT, PROT, ALBUMIN in the last 168 hours. No results for input(s): LIPASE, AMYLASE in the last 168 hours. No results for input(s): AMMONIA in the last 168 hours. CBC:  Recent Labs Lab 08/01/14 1850 08/02/14 0320  WBC 14.2* 12.5*  HGB 12.4 11.5*  HCT 40.9 37.8  MCV 91.3 91.1  PLT 248 249   Cardiac Enzymes:  Recent Labs Lab  08/01/14 2309 08/02/14 0320  TROPONINI 0.11* 0.12*   BNP (last 3 results)  Recent Labs  08/01/14 1850  BNP 658.4*    ProBNP (last 3 results)  Recent Labs  02/22/14 0947 02/24/14 0332 03/07/14 0648  PROBNP 2319.0* 2410.0* 437.5    CBG: No results for input(s): GLUCAP in the last 168 hours.  No results found for this or any previous visit (from the past 240 hour(s)).   Studies: Dg Chest Port 1 View  08/01/2014   CLINICAL DATA:  Shortness of breath  EXAM: PORTABLE CHEST - 1 VIEW   COMPARISON:  March 19, 2014  FINDINGS: The mediastinal contour is normal. The heart size is enlarged. The aorta is tortuous. There is increased bilateral pulmonary interstitium. Patchy consolidation of left lung base is noted. There is no pleural effusion. The visualized skeletal structures are stable.  IMPRESSION: Congestive heart failure.  Patchy consolidation of left lung base, superimposed pneumonia is not excluded.   Electronically Signed   By: Sherian ReinWei-Chen  Lin M.D.   On: 08/01/2014 19:18    Scheduled Meds: . allopurinol  300 mg Oral Daily  . amiodarone  100 mg Oral Daily  . aspirin EC  81 mg Oral Daily  . atorvastatin  10 mg Oral Daily  . azithromycin  500 mg Intravenous Q24H  . budesonide  0.5 mg Nebulization BID  . cefTRIAXone (ROCEPHIN)  IV  1 g Intravenous Q24H  . diltiazem  120 mg Oral Daily  . escitalopram  20 mg Oral Daily  . famotidine  20 mg Oral Daily  . furosemide  80 mg Intravenous BID  . gabapentin  300 mg Oral Daily  . hydrocortisone sod succinate (SOLU-CORTEF) inj  50 mg Intravenous 3 times per day  . levalbuterol  0.63 mg Nebulization QID  . loratadine  10 mg Oral Daily  . potassium chloride  40 mEq Oral BID  . Warfarin - Pharmacist Dosing Inpatient   Does not apply q1800   Continuous Infusions:   Principal Problem:   SOB (shortness of breath) Active Problems:   OBESITY-MORBID (>100')   Essential hypertension   EMBOLISM & INFARCTION, IATROGENIC PULMONARY   Atrial fibrillation   DIASTOLIC HEART FAILURE, CHRONIC   Abdominal aortic aneurysm   Allergic rhinitis   CHF exacerbation   Hx pulmonary embolism   History of DVT (deep vein thrombosis)   CAP (community acquired pneumonia)   Interstitial lung disease    Time spent: 35 minutes.     [name] [name], [name] [name]  [facility] Pager [phone]. If 7PM-7AM, please contact night-coverage at [email or web address], password [email or web address] 08/02/2014, 7:50 AM  LOS: 1 day

## 2014-08-02 NOTE — Progress Notes (Signed)
Pt. Arrived to the floor via stretcher alert and oriented. Pt. On ventiuri mask at 50% with 9L O2. O2 saturation 93%. Pt. Oriented to room and placed on telemetry. CCMD notified. Call light explained to pt. And placed within reach. RT notified of patients arrival to the floor as the pt. Has respiratory orders and Bi-pap use at bedtime. Pt. Currently resting in bed, family at bedside. RN will continue to monitor pt. For changes in condition. Tahsin Benyo, Cheryll DessertKaren Cherrell

## 2014-08-02 NOTE — Consult Note (Signed)
Name: [name] [name] MRN: [id number] DOB: 10/08/1931    ADMISSION DATE:  08/01/2014 CONSULTATION DATE:  3/22  REFERRING MD :  Sunnie Nielsen   CHIEF COMPLAINT:  Acute on chronic respiratory failure   BRIEF PATIENT DESCRIPTION:  81 year old female w/ chronic respiratory failure on the basis of underlying ILD (radiographically c/w NSIP); further c/b diastolic HF, OSA and secondary PAH. Had initially seen symptomatic improvement on 40 mg/pred started January 2016, however d/t wt gain, and then resultant dyspnea. She was admitted on 3/21 for acute on chronic respiratory failure   SIGNIFICANT EVENTS  1/5: initial consult w/ Mcquaid. Started on Pred trial 40 mg for 2 weeks., then 30 mg x 1 month.  2/8 HHN calling our office. Reporting weight gain from 1/20 starting 267-->274; diuretic increased by her cardiologist.  2/24 seen in f/u by cards. Wt increased to 280 lbs. Blood work suggesting some volume depletion. Wt gain attributed to increased adipose tissue 3/7 McQuaid visit. Wt now 285. Decided to wean off pred as side effects now worsening and dyspnea worsening.  3/18 low gd fever, projectile vomiting. Following this much worsening SOB 3/21: calling our office reporting worsening SOB   STUDIES:     HISTORY OF PRESENT ILLNESS:   This is a 81 year old female w/ chronic respiratory failure in the setting of ILD (w/ radiographic findings most c/w NSIP). She was never biopsied due to concern that she would not tolerate procedure. Her respiratory status is further complicated by diastolic heart failure and secondary PAH. Based on initial evaluation back in Jan 2016 she was placed on a prednisone trial (starting at 40 mg daily for 2 weeks). She initially felt some improvement with this therapy.  On her best day she felt that she could ambulate close to 100 ft on 3.5 liters while on 40 of pred. Prior to that 10 feet would have been a challenge. However these benefits were short lived and outweighed by the  side effects which included: weight gain (adipose tissue> water w/ 20 lb gain), fatigue, weeping form her legs and worsening dyspnea -->not ever as bad as before the pred. Based on this it was decided on her March 7 visit to attempt to wean the steroids to off. On 3/18 she had what sounded like a viral illness w/ 3 episodes of projectile vomiting; and low grade fever. She notes that following this her symptom burden really started to decline. On On 3/21 our office was called by the home health nurse stating her resting pulse ox on 3 liters was 69%, this increased to 82% on 5 liters. She notes that her dyspnea really began to take a turn for the worse after her decrease from 20 mg.  We recommended that she be seen. She was admitted on 3/21 w/ multifactorial respiratory failure. She was  Placed on IV lasix, empiric antibiotics and IV solu-cortef. We were asked to see and assist w/ management of this very complicated case.   PAST MEDICAL HISTORY :   has a past medical history of Congestive heart failure, unspecified; Chronic atrial fibrillation; Personal history of DVT (deep vein thrombosis) (2008); Obstructive sleep apnea; HTN (hypertension); Morbid obesity; Shingles; GERD (gastroesophageal reflux disease); Hyperlipidemia; Meningitis (1950s); Gallstones; CAD (coronary artery disease); AAA (abdominal aortic aneurysm); PE (pulmonary embolism); and Anemia.  has past surgical history that includes Appendectomy (1947); Tonsillectomy (1946); hospitalized with meningitis at [facility] [phone]); Cholecystectomy; Cardioversion (01/2007); and right heart catheterization (N/A, 02/28/2014). Prior to Admission medications  Medication Sig Start Date End Date Taking? Authorizing Provider  allopurinol (ZYLOPRIM) 300 MG tablet Take 1 tablet (300 mg total) by mouth daily. 06/08/14  Yes Amy Michelle NasutiE Bedsole, MD  amiodarone (PACERONE) 200 MG tablet Take 100 mg by mouth daily.  04/16/14  Yes Historical Provider, MD  aspirin 81 MG tablet  Take 81 mg by mouth daily.   Yes Historical Provider, MD  atorvastatin (LIPITOR) 10 MG tablet Take 1 tablet (10 mg total) by mouth daily. 07/18/14  Yes Amy E Bedsole, MD  budesonide (PULMICORT) 0.5 MG/2ML nebulizer solution Take 2 mLs (0.5 mg total) by nebulization 2 (two) times daily. 04/19/14  Yes Amy Michelle NasutiE Bedsole, MD  cetirizine (ZYRTEC ALLERGY) 10 MG tablet Take 10 mg by mouth as needed for allergies.    Yes Historical Provider, MD  diltiazem (CARDIZEM CD) 120 MG 24 hr capsule Take 1 capsule (120 mg total) by mouth daily. 07/18/14  Yes Amy E Bedsole, MD  escitalopram (LEXAPRO) 20 MG tablet Take 1 tablet (20 mg total) by mouth daily. 05/02/14  Yes Amy Michelle NasutiE Bedsole, MD  famotidine (PEPCID) 20 MG tablet Take 1 tablet (20 mg total) by mouth daily. 04/15/14  Yes Antonieta Ibaimothy J Gollan, MD  gabapentin (NEURONTIN) 300 MG capsule Take 1 capsule (300 mg total) by mouth daily. 04/19/14  Yes Amy Michelle NasutiE Bedsole, MD  levalbuterol (XOPENEX) 0.63 MG/3ML nebulizer solution Take 3 mLs (0.63 mg total) by nebulization 4 (four) times daily. 03/02/14  Yes Russella DarAllison L Ellis, NP  OVER THE COUNTER MEDICATION Inhale 1 application into the lungs at bedtime. USES BIPAP EVERY BEDTIME FOR SLEEP   Yes Historical Provider, MD  potassium chloride SA (K-DUR,KLOR-CON) 20 MEQ tablet Take 20 mEq by mouth daily.   Yes Historical Provider, MD  predniSONE (DELTASONE) 5 MG tablet 15mg  daily for one week, 10mg  daily for one week, 5mg  daily for one week 07/18/14  Yes Lupita Leashouglas B McQuaid, MD  torsemide (DEMADEX) 20 MG tablet Take 2 tablets (40 mg total) by mouth 2 (two) times daily. 07/06/14  Yes Antonieta Ibaimothy J Gollan, MD  warfarin (COUMADIN) 2.5 MG tablet Take as directed. Patient taking differently: Take 2.5-3.75 mg by mouth daily at 6 PM. Takes 1.5 tabs on Tues, Thurs and Sun Takes 1 tab all other days 06/27/14  Yes Amy E Ermalene SearingBedsole, MD  colchicine 0.6 MG tablet Take 0.6 mg by mouth as needed (for flareup).  01/11/14   Historical Provider, MD   No Known Allergies  FAMILY  HISTORY:  family history includes Depression in her mother; Heart disease in her paternal grandfather; Heart failure in her mother; Obesity in her other; Ovarian cancer in her paternal grandmother. SOCIAL HISTORY:  reports that she has never smoked. She has never used smokeless tobacco. She reports that she drinks alcohol. She reports that she does not use illicit drugs.  REVIEW OF SYSTEMS:   Constitutional: Negative for fever, chills, +wt gain > 20 lbs, malaise/fatigue and diaphoresis.  HENT: Negative for hearing loss, ear pain, nosebleeds, congestion, sore throat, neck pain, tinnitus and ear discharge.   Eyes: Negative for blurred vision, double vision, photophobia, pain, discharge and redness.  Respiratory: Negative for sig cough, hemoptysis, sputum production, shortness of breath, wheezing and stridor.   Cardiovascular: Negative for chest pain, palpitations, orthopnea, claudication, leg swelling and PND.  Gastrointestinal: Negative for heartburn, nausea, vomiting, abdominal pain, diarrhea, constipation, blood in stool and melena.  Genitourinary: Negative for dysuria, urgency, frequency, hematuria and flank pain.  Musculoskeletal: Negative for myalgias, back pain, joint  pain and falls.  Skin: Negative for itching and rash.  Neurological: Negative for dizziness, tingling, tremors, sensory change, speech change, focal weakness, seizures, loss of consciousness, weakness and headaches.  Endo/Heme/Allergies: Negative for environmental allergies and polydipsia. Does not bruise/bleed easily.  SUBJECTIVE:  No distress resting in bed  VITAL SIGNS: Temp:  [97.2 F (36.2 C)-98.3 F (36.8 C)] 97.2 F (36.2 C) (03/22 0447) Pulse Rate:  [66-77] 73 (03/22 0511) Resp:  [18-44] 18 (03/22 0511) BP: (130-149)/(48-89) 144/66 mmHg (03/22 0447) SpO2:  [81 %-97 %] 93 % (03/22 0811) FiO2 (%):  [50 %] 50 % (03/22 0811) Weight:  [128.822 kg (284 lb)-132.8 kg (292 lb 12.3 oz)] 132.6 kg (292 lb 5.3 oz) (03/22  0447) Filed Weights   08/01/14 2130 08/01/14 2223 08/02/14 0447  Weight: 128.822 kg (284 lb) 132.8 kg (292 lb 12.3 oz) 132.6 kg (292 lb 5.3 oz)    Intake/Output Summary (Last 24 hours) at 08/02/14 1001 Last data filed at 08/02/14 0900  Gross per 24 hour  Intake 1013.13 ml  Output   1225 ml  Net -211.87 ml   PHYSICAL EXAMINATION: General:  Chronically ill appearing 81 year old female in no distress Neuro:  Awake, alert, no focal def  HEENT:  Neck large. No clear JVD, MMM, + upper airway wheeze  Cardiovascular:  rrr Lungs:  Crackles in bases  Abdomen:  Obese + bowel sounds  Musculoskeletal:  Intact  Skin:  + LE edema w/ chronic venous stasis changes.    Recent Labs Lab 08/01/14 1850 08/02/14 0320  NA 142 146*  K 3.8 3.4*  CL 100 100  CO2 29 34*  BUN 27* 25*  CREATININE 1.47* 1.32*  GLUCOSE 107* 102*    Recent Labs Lab 08/01/14 1850 08/02/14 0320  HGB 12.4 11.5*  HCT 40.9 37.8  WBC 14.2* 12.5*  PLT 248 249   Dg Chest Port 1 View  08/01/2014   CLINICAL DATA:  Shortness of breath  EXAM: PORTABLE CHEST - 1 VIEW  COMPARISON:  March 19, 2014  FINDINGS: The mediastinal contour is normal. The heart size is enlarged. The aorta is tortuous. There is increased bilateral pulmonary interstitium. Patchy consolidation of left lung base is noted. There is no pleural effusion. The visualized skeletal structures are stable.  IMPRESSION: Congestive heart failure.  Patchy consolidation of left lung base, superimposed pneumonia is not excluded.   Electronically Signed   By: Sherian Rein M.D.   On: 08/01/2014 19:18    ASSESSMENT / PLAN:  Acute on Chronic Hypoxic Respiratory failure ILD (presumed NSIP by CT scan) OSA on CPAP Secondary PAH Decompensated diastolic HF Decompensated Cor pulmonale  Probable OHS w/ substantial weight gain and worsening deconditioning  Af (currently NSR but on amiodarone) Recent viral illness  Obesity htn  Prior PE/DVT   Multi-factorial Acute on  Chronic respiratory failure on the basis of what is likely some combination of decompensated diastolic HF,cor pulmonale, obesity hypoventilation, and deconditioning super-imposed on underlying ILD (presumed NSIP by CT scan).  Her ILD seemed initially somewhat steroid responsive however she had substantial wt gain resulted in return of dyspnea,  and worsening edema ultimately resulting in the decision to decrease her pred. She has since had an episode of what seemed like a viral illness. Unclear to what extent this is playing on her current symptom burden but she feels that her dyspnea has dramatically worsened since then. Probably no one intervention here to fix things. Suspect we can make some impact on her  dyspnea but ultimately will really need to focus on what her O2 demand at home will be as well  Recs Push diuresis Titrate O2-->may need oximizer.  F/u on viral panel Decrease steroids back to 20 mg pred-->still think we should focus on weaning to off Consider stopping amiodarone.  Consider am abg to eval hypoventilation and possible convert CPAP to BIPAP Cont current abx for now, but don't think she's infected  Cont anticoagulation     Simonne Martinet ACNP-BC [facility] Pager # [phone] OR # [phone] if no answer   08/02/2014, 8:17 AM

## 2014-08-02 NOTE — Progress Notes (Signed)
PT Cancellation Note  Patient Details Name: [name] [name] MRN: [id number] DOB: 10/13/1931   Cancelled Treatment:    Reason Eval/Treat Not Completed: Medical issues which prohibited therapy  Noted order for transfer to stepdown (now on BiPAP, elevated troponin). Will reattempt evaluation 3/23 if appropriate  Clemmie Marxen 08/02/2014, 8:14 AM  Pager [phone]

## 2014-08-03 ENCOUNTER — Encounter (HOSPITAL_COMMUNITY): Payer: Self-pay

## 2014-08-03 ENCOUNTER — Inpatient Hospital Stay (HOSPITAL_COMMUNITY): Payer: Medicare Other

## 2014-08-03 DIAGNOSIS — J9601 Acute respiratory failure with hypoxia: Secondary | ICD-10-CM

## 2014-08-03 DIAGNOSIS — J9621 Acute and chronic respiratory failure with hypoxia: Secondary | ICD-10-CM

## 2014-08-03 DIAGNOSIS — I509 Heart failure, unspecified: Secondary | ICD-10-CM

## 2014-08-03 LAB — BLOOD GAS, ARTERIAL
Acid-Base Excess: 8.6 mmol/L — ABNORMAL HIGH (ref 0.0–2.0)
Bicarbonate: 33.7 mEq/L — ABNORMAL HIGH (ref 20.0–24.0)
Drawn by: 419771
FIO2: 0.45 %
O2 Saturation: 94.8 %
Patient temperature: 98.6
TCO2: 35.5 mmol/L (ref 0–100)
pCO2 arterial: 57.3 mmHg (ref 35.0–45.0)
pH, Arterial: 7.388 (ref 7.350–7.450)
pO2, Arterial: 82.6 mmHg (ref 80.0–100.0)

## 2014-08-03 LAB — RESPIRATORY VIRUS PANEL
ADENOVIRUS: NEGATIVE
INFLUENZA A: NEGATIVE
Influenza B: NEGATIVE
METAPNEUMOVIRUS: NEGATIVE
PARAINFLUENZA 3 A: NEGATIVE
Parainfluenza 1: NEGATIVE
Parainfluenza 2: NEGATIVE
RESPIRATORY SYNCYTIAL VIRUS A: NEGATIVE
Respiratory Syncytial Virus B: NEGATIVE
Rhinovirus: NEGATIVE

## 2014-08-03 LAB — BASIC METABOLIC PANEL
ANION GAP: 11 (ref 5–15)
BUN: 19 mg/dL (ref 6–23)
CHLORIDE: 100 mmol/L (ref 96–112)
CO2: 33 mmol/L — AB (ref 19–32)
Calcium: 8.7 mg/dL (ref 8.4–10.5)
Creatinine, Ser: 1.27 mg/dL — ABNORMAL HIGH (ref 0.50–1.10)
GFR calc non Af Amer: 39 mL/min — ABNORMAL LOW (ref >90)
GFR, EST AFRICAN AMERICAN: 45 mL/min — AB (ref >90)
Glucose, Bld: 81 mg/dL (ref 70–99)
Potassium: 3.4 mmol/L — ABNORMAL LOW (ref 3.5–5.1)
Sodium: 144 mmol/L (ref 135–145)

## 2014-08-03 LAB — CBC
HCT: 35.8 % — ABNORMAL LOW (ref 36.0–46.0)
HEMOGLOBIN: 10.9 g/dL — AB (ref 12.0–15.0)
MCH: 27.9 pg (ref 26.0–34.0)
MCHC: 30.4 g/dL (ref 30.0–36.0)
MCV: 91.6 fL (ref 78.0–100.0)
PLATELETS: 263 10*3/uL (ref 150–400)
RBC: 3.91 MIL/uL (ref 3.87–5.11)
RDW: 18.4 % — ABNORMAL HIGH (ref 11.5–15.5)
WBC: 13.8 10*3/uL — ABNORMAL HIGH (ref 4.0–10.5)

## 2014-08-03 LAB — EXPECTORATED SPUTUM ASSESSMENT W GRAM STAIN, RFLX TO RESP C

## 2014-08-03 LAB — PROCALCITONIN: Procalcitonin: <0.1 ng/mL

## 2014-08-03 LAB — PROTIME-INR
INR: 1.81 — AB (ref 0.00–1.49)
PROTHROMBIN TIME: 21.1 s — AB (ref 11.6–15.2)

## 2014-08-03 LAB — SEDIMENTATION RATE: Sed Rate: 44 mm/hr — ABNORMAL HIGH (ref 0–22)

## 2014-08-03 LAB — EXPECTORATED SPUTUM ASSESSMENT W REFEX TO RESP CULTURE

## 2014-08-03 LAB — HEPARIN LEVEL (UNFRACTIONATED): HEPARIN UNFRACTIONATED: 0.28 [IU]/mL — AB (ref 0.30–0.70)

## 2014-08-03 LAB — UREA NITROGEN, URINE: Urea Nitrogen, Ur: 206 mg/dL

## 2014-08-03 MED ORDER — METHYLPREDNISOLONE SODIUM SUCC 125 MG IJ SOLR
60.0000 mg | Freq: Two times a day (BID) | INTRAMUSCULAR | Status: DC
  Administered 2014-08-03 – 2014-08-06 (×7): 60 mg via INTRAVENOUS
  Filled 2014-08-03: qty 0.96
  Filled 2014-08-03: qty 2
  Filled 2014-08-03 (×5): qty 0.96

## 2014-08-03 MED ORDER — WARFARIN SODIUM 5 MG PO TABS
5.0000 mg | ORAL_TABLET | Freq: Once | ORAL | Status: DC
  Filled 2014-08-03: qty 1

## 2014-08-03 MED ORDER — POTASSIUM CHLORIDE CRYS ER 20 MEQ PO TBCR
40.0000 meq | EXTENDED_RELEASE_TABLET | Freq: Two times a day (BID) | ORAL | Status: AC
  Administered 2014-08-03 (×2): 40 meq via ORAL
  Filled 2014-08-03 (×2): qty 2

## 2014-08-03 NOTE — Discharge Instructions (Addendum)
Information on my medicine - Coumadin®   (Warfarin) ° °Why was Coumadin prescribed for you? °Coumadin was prescribed for you because you have a blood clot or a medical condition that can cause an increased risk of forming blood clots. Blood clots can cause serious health problems by blocking the flow of blood to the heart, lung, or brain. Coumadin can prevent harmful blood clots from forming. °As a reminder your indication for Coumadin is:   Stroke Prevention Because Of Atrial Fibrillation ° °What test will check on my response to Coumadin? °While on Coumadin (warfarin) you will need to have an INR test regularly to ensure that your dose is keeping you in the desired range. The INR (international normalized ratio) number is calculated from the result of the laboratory test called prothrombin time (PT). ° °If an INR APPOINTMENT HAS NOT ALREADY BEEN MADE FOR YOU please schedule an appointment to have this lab work done by your health care provider within 7 days. °Your INR goal is usually a number between:  2 to 3 or your provider may give you a more narrow range like 2-2.5.  Ask your health care provider during an office visit what your goal INR is. ° °What  do you need to  know  About  COUMADIN? °Take Coumadin (warfarin) exactly as prescribed by your healthcare provider about the same time each day.  DO NOT stop taking without talking to the doctor who prescribed the medication.  Stopping without other blood clot prevention medication to take the place of Coumadin may increase your risk of developing a new clot or stroke.  Get refills before you run out. ° °What do you do if you miss a dose? °If you miss a dose, take it as soon as you remember on the same day then continue your regularly scheduled regimen the next day.  Do not take two doses of Coumadin at the same time. ° °Important Safety Information °A possible side effect of Coumadin (Warfarin) is an increased risk of bleeding. You should call your healthcare  provider right away if you experience any of the following: °? Bleeding from an injury or your nose that does not stop. °? Unusual colored urine (red or dark brown) or unusual colored stools (red or black). °? Unusual bruising for unknown reasons. °? A serious fall or if you hit your head (even if there is no bleeding). ° °Some foods or medicines interact with Coumadin® (warfarin) and might alter your response to warfarin. To help avoid this: °? Eat a balanced diet, maintaining a consistent amount of Vitamin K. °? Notify your provider about major diet changes you plan to make. °? Avoid alcohol or limit your intake to 1 drink for women and 2 drinks for men per day. °(1 drink is 5 oz. wine, 12 oz. beer, or 1.5 oz. liquor.) ° °Make sure that ANY health care provider who prescribes medication for you knows that you are taking Coumadin (warfarin).  Also make sure the healthcare provider who is monitoring your Coumadin knows when you have started a new medication including herbals and non-prescription products. ° °Coumadin® (Warfarin)  Major Drug Interactions  °Increased Warfarin Effect Decreased Warfarin Effect  °Alcohol (large quantities) °Antibiotics (esp. Septra/Bactrim, Flagyl, Cipro) °Amiodarone (Cordarone) °Aspirin (ASA) °Cimetidine (Tagamet) °Megestrol (Megace) °NSAIDs (ibuprofen, naproxen, etc.) °Piroxicam (Feldene) °Propafenone (Rythmol SR) °Propranolol (Inderal) °Isoniazid (INH) °Posaconazole (Noxafil) Barbiturates (Phenobarbital) °Carbamazepine (Tegretol) °Chlordiazepoxide (Librium) °Cholestyramine (Questran) °Griseofulvin °Oral Contraceptives °Rifampin °Sucralfate (Carafate) °Vitamin K  ° °Coumadin® (Warfarin) Major Herbal   Interactions  Increased Warfarin Effect Decreased Warfarin Effect  Garlic Ginseng Ginkgo biloba Coenzyme Q10 Green tea St. Johns wort    Coumadin (Warfarin) FOOD Interactions  Eat a consistent number of servings per week of foods HIGH in Vitamin K (1 serving =  cup)  Collards  (cooked, or boiled & drained) Kale (cooked, or boiled & drained) Mustard greens (cooked, or boiled & drained) Parsley *serving size only =  cup Spinach (cooked, or boiled & drained) Swiss chard (cooked, or boiled & drained) Turnip greens (cooked, or boiled & drained)  Eat a consistent number of servings per week of foods MEDIUM-HIGH in Vitamin K (1 serving = 1 cup)  Asparagus (cooked, or boiled & drained) Broccoli (cooked, boiled & drained, or raw & chopped) Brussel sprouts (cooked, or boiled & drained) *serving size only =  cup Lettuce, raw (green leaf, endive, romaine) Spinach, raw Turnip greens, raw & chopped   These websites have more information on Coumadin (warfarin):  http://www.king-russell.com/; https://www.hines.net/;    Chronic Obstructive Pulmonary Disease Chronic obstructive pulmonary disease (COPD) is a common lung problem. In COPD, the flow of air from the lungs is limited. The way your lungs work will probably never return to normal, but there are things you can do to improve your lungs and make yourself feel better. HOME CARE  Take all medicines as told by your doctor.  Avoid medicines or cough syrups that dry up your airway (such as antihistamines) and do not allow you to get rid of thick spit. You do not need to avoid them if told differently by your doctor.  If you smoke, stop. Smoking makes the problem worse.  Avoid being around things that make your breathing worse (like smoke, chemicals, and fumes).  Use oxygen therapy and therapy to help improve your lungs (pulmonary rehabilitation) if told by your doctor. If you need home oxygen therapy, ask your doctor if you should buy a tool to measure your oxygen level (oximeter).  Avoid people who have a sickness you can catch (contagious).  Avoid going outside when it is very hot, cold, or humid.  Eat healthy foods. Eat smaller meals more often. Rest before meals.  Stay active, but remember to also  rest.  Make sure to get all the shots (vaccines) your doctor recommends. Ask your doctor if you need a pneumonia shot.  Learn and use tips on how to relax.  Learn and use tips on how to control your breathing as told by your doctor. Try:  Breathing in (inhaling) through your nose for 1 second. Then, pucker your lips and breath out (exhale) through your lips for 2 seconds.  Putting one hand on your belly (abdomen). Breathe in slowly through your nose for 1 second. Your hand on your belly should move out. Pucker your lips and breathe out slowly through your lips. Your hand on your belly should move in as you breathe out.  Learn and use controlled coughing to clear thick spit from your lungs. The steps are: 1. Lean your head a little forward. 2. Breathe in deeply. 3. Try to hold your breath for 3 seconds. 4. Keep your mouth slightly open while coughing 2 times. 5. Spit any thick spit out into a tissue. 6. Rest and do the steps again 1 or 2 times as needed. GET HELP IF:  You cough up more thick spit than usual.  There is a change in the color or thickness of the spit.  It is harder to breathe than  usual.  Your breathing is faster than usual. GET HELP RIGHT AWAY IF:   You have shortness of breath while resting.  You have shortness of breath that stops you from:  Being able to talk.  Doing normal activities.  You chest hurts for longer than 5 minutes.  Your skin color is more blue than usual.  Your pulse oximeter shows that you have low oxygen for longer than 5 minutes. MAKE SURE YOU:   Understand these instructions.  Will watch your condition.  Will get help right away if you are not doing well or get worse. Document Released: 10/16/2007 Document Revised: 09/13/2013 Document Reviewed: 12/24/2012 [facility] Patient Information 2015 [employer], [place]. This information is not intended to replace advice given to you by your health care provider. Make sure you discuss any  questions you have with your health care provider.

## 2014-08-03 NOTE — Evaluation (Signed)
Occupational Therapy Evaluation Patient Details Name: [name] [name] MRN: [id number] DOB: 1932/02/17 Today's Date: 08/03/2014    History of Present Illness Pt admitted with multifactoral acute on chronic respiratory failure and fever.  PMH: afib, CHF, interstitial lung disease, HTN, gout, DVT, PE, anemia and CKD.   Clinical Impression   Pt was modified independent in self care and mobility prior to admission.  She was assisted for IADL.  Pt presents with generalized weakness, impaired balance and decreased activity tolerance interfering with ability to perform self care at her baseline. Pt currently in SDU on venti mask.  Will follow acutely.    Follow Up Recommendations  Home health OT    Equipment Recommendations  None recommended by OT    Recommendations for Other Services       Precautions / Restrictions Precautions Precautions: Fall Precaution Comments: watch 02 Restrictions Other Position/Activity Restrictions: pt on 3L 02 at baseline      Mobility Bed Mobility Overal bed mobility: Needs Assistance Bed Mobility: Supine to Sit     Supine to sit: Supervision;HOB elevated     General bed mobility comments: use of rail  Transfers Overall transfer level: Needs assistance   Transfers: Stand Pivot Transfers   Stand pivot transfers: Min assist            Balance Overall balance assessment: Needs assistance Sitting-balance support: Feet supported Sitting balance-Leahy Scale: Good       Standing balance-Leahy Scale: Fair                              ADL Overall ADL's : Needs assistance/impaired Eating/Feeding: Independent;Sitting   Grooming: Wash/dry hands;Sitting;Set up   Upper Body Bathing: Minimal assitance;Sitting   Lower Body Bathing: Sit to/from stand;Moderate assistance   Upper Body Dressing : Set up;Sitting   Lower Body Dressing: Sit to/from stand;Moderate assistance   Toilet Transfer: Minimal assistance;Stand-pivot;BSC    Toileting- Clothing Manipulation and Hygiene: Minimal assistance;Sit to/from stand         General ADL Comments: Pt uses AE at baseline.     Vision     Perception     Praxis      Pertinent Vitals/Pain Pain Assessment: No/denies pain     Hand Dominance Right   Extremity/Trunk Assessment Upper Extremity Assessment Upper Extremity Assessment: Generalized weakness   Lower Extremity Assessment Lower Extremity Assessment: Defer to PT evaluation       Communication Communication Communication: No difficulties   Cognition Arousal/Alertness: Awake/alert Behavior During Therapy: WFL for tasks assessed/performed Overall Cognitive Status: Within Functional Limits for tasks assessed                     General Comments       Exercises       Shoulder Instructions      Home Living Family/patient expects to be discharged to:: Private residence Living Arrangements: Children (son) Available Help at Discharge: Family;Available 24 hours/day;Other (Comment) Psychiatrist) Type of Home: House Home Access: Stairs to enter [employer] of Steps: 2 Entrance Stairs-Rails: Left;Right;Can reach both Home Layout: One level     Bathroom Shower/Tub: Producer, television/film/video: Handicapped height     Home Equipment: Shower seat - built in;Grab bars - tub/shower;Walker - 2 wheels;Transport chair;Bedside commode;Other (comment);Adaptive equipment (02) Adaptive Equipment: Reacher;Sock aid;Long-handled shoe horn;Long-handled sponge Additional Comments: Son lives with her; has sitter 6 hours a day      Prior Functioning/Environment  Level of Independence: Needs assistance  Gait / Transfers Assistance Needed: Uses RW and has lift chair and elevated height bed.   ADL's / Homemaking Assistance Needed: (A) with cooking/cleaning; reports she can dress and bathe herself         OT Diagnosis: Generalized weakness   OT Problem List: Decreased strength;Decreased  activity tolerance;Impaired balance (sitting and/or standing);Cardiopulmonary status limiting activity;Obesity   OT Treatment/Interventions: Self-care/ADL training;Energy conservation;Therapeutic activities;Balance training;Patient/family education    OT Goals(Current goals can be found in the care plan section) Acute Rehab OT Goals Patient Stated Goal: return home OT Goal Formulation: With patient Time For Goal Achievement: 08/17/14 Potential to Achieve Goals: Good ADL Goals Pt Will Perform Grooming: with supervision;standing Pt Will Perform Lower Body Bathing: with supervision;sit to/from stand;with adaptive equipment Pt Will Perform Lower Body Dressing: with supervision;sit to/from stand;with adaptive equipment Pt Will Transfer to Toilet: with supervision;ambulating Pt Will Perform Toileting - Clothing Manipulation and hygiene: with supervision;sit to/from stand Pt Will Perform Tub/Shower Transfer: Shower transfer;with supervision;ambulating;rolling walker  OT Frequency: Min 2X/week   Barriers to D/C:            Co-evaluation              End of Session Equipment Utilized During Treatment: Oxygen;Gait belt Nurse Communication: Mobility status  Activity Tolerance: Patient limited by fatigue Patient left: in chair;with call bell/phone within reach   Time: 1435-1514 OT Time Calculation (min): 39 min Charges:  OT General Charges $OT Visit: 1 Procedure OT Evaluation $Initial OT Evaluation Tier I: 1 Procedure OT Treatments $Self Care/Home Management : 23-37 mins G-Codes:    Evern BioMayberry, Yaeko Fazekas Lynn 08/03/2014, 4:05 PM  [phone]

## 2014-08-03 NOTE — Progress Notes (Addendum)
Name: [name] [name] MRN: [id number] DOB: 04/19/1932    ADMISSION DATE:  08/01/2014 CONSULTATION DATE:  3/22  REFERRING MD :  Sunnie Nielsen   CHIEF COMPLAINT:  Acute on chronic respiratory failure   BRIEF PATIENT DESCRIPTION:   This is a 81 year old female w/ chronic respiratory failure in the setting of ILD (w/ radiographic findings most c/w NSIP) ; ANA pos, SSA positive with hx of dry mouth. She was never biopsied due to concern that she would not tolerate procedure. Her respiratory status is further complicated by diastolic heart failure and secondary PAH. Based on initial evaluation back in Jan 2016 she was placed on a prednisone trial (starting at 40 mg daily for 2 weeks). She initially felt some improvement with this therapy.  On her best day she felt that she could ambulate close to 100 ft on 3.5 liters while on 40 of pred. Prior to that 10 feet would have been a challenge. However these benefits were short lived and outweighed by the side effects which included: weight gain (adipose tissue> water w/ 20 lb gain), fatigue, weeping form her legs and worsening dyspnea -->not ever as bad as before the pred. Based on this it was decided on her March 7 visit to attempt to wean the steroids to off. On 3/18 she had what sounded like a viral illness w/ 3 episodes of projectile vomiting; and low grade fever. She notes that following this her symptom burden really started to decline. On On 3/21 our office was called by the home health nurse stating her resting pulse ox on 3 liters was 69%, this increased to 82% on 5 liters. She notes that her dyspnea really began to take a turn for the worse after her decrease from 20 mg.  We recommended that she be seen. She was admitted on 3/21 w/ multifactorial respiratory failure. She was  Placed on IV lasix, empiric antibiotics and IV solu-cortef. We were asked to see and assist w/ management of this very complicated case.    SIGNIFICANT EVENTS  1/5: initial consult w/  [facility]. Started on Pred trial 40 mg for 2 weeks., then 30 mg x 1 month.  2/8 HHN calling our office. Reporting weight gain from 1/20 starting 267-->274; diuretic increased by her cardiologist.  2/24 seen in f/u by cards. Wt increased to 280 lbs. Blood work suggesting some volume depletion. Wt gain attributed to increased adipose tissue 3/7 [facility] visit. Wt now 285. Decided to wean off pred as side effects now worsening and dyspnea worsening.  3/18 low gd fever, projectile vomiting. Following this much worsening SOB 3/21: calling our office reporting worsening SOB 08/01/2014 - admit 08/02/14: PCCM cnsult   SUBJECTIVE:  08/03/14: still on 15L [place]. Resp Virus pnale - pending, sed rate pending  VITAL SIGNS: Temp:  [97.1 F (36.2 C)-98 F (36.7 C)] 97.3 F (36.3 C) (03/23 0804) Pulse Rate:  [58-76] 67 (03/23 0900) Resp:  [14-27] 14 (03/23 0900) BP: (130-170)/(54-125) 146/125 mmHg (03/23 0900) SpO2:  [83 %-96 %] 94 % (03/23 0916) FiO2 (%):  [45 %-50 %] 50 % (03/23 0916) Filed Weights   08/01/14 2130 08/01/14 2223 08/02/14 0447  Weight: 128.822 kg (284 lb) 132.8 kg (292 lb 12.3 oz) 132.6 kg (292 lb 5.3 oz)    Intake/Output Summary (Last 24 hours) at 08/03/14 1036 Last data filed at 08/03/14 0800  Gross per 24 hour  Intake 1266.25 ml  Output   2350 ml  Net -1083.75 ml   PHYSICAL  EXAMINATION: General:  Chronically ill appearing 81 year old female in no distress Neuro:  Awake, alert, no focal def  HEENT:  Neck large. No clear JVD, MMM, + wheeze  Cardiovascular:  rrr Lungs:  Crackles in bases  Abdomen:  Obese + bowel sounds  Musculoskeletal:  Intact  Skin:  + LE edema w/ chronic venous stasis changes.   PULMONARY  Recent Labs Lab 08/03/14 0502  PHART 7.388  PCO2ART 57.3*  PO2ART 82.6  HCO3 33.7*  TCO2 35.5  O2SAT 94.8    CBC  Recent Labs Lab 08/01/14 1850 08/02/14 0320 08/03/14 0253  HGB 12.4 11.5* 10.9*  HCT 40.9 37.8 35.8*  WBC 14.2* 12.5* 13.8*  PLT 248 249  263    COAGULATION  Recent Labs Lab 08/01/14 1850 08/02/14 0320 08/03/14 0253  INR 1.53* 1.64* 1.81*    CARDIAC   Recent Labs Lab 08/01/14 2309 08/02/14 0320 08/02/14 1030  TROPONINI 0.11* 0.12* 0.06*   No results for input(s): PROBNP in the last 168 hours.   CHEMISTRY  Recent Labs Lab 08/01/14 1850 08/02/14 0320 08/03/14 0253  NA 142 146* 144  K 3.8 3.4* 3.4*  CL 100 100 100  CO2 29 34* 33*  GLUCOSE 107* 102* 81  BUN 27* 25* 19  CREATININE 1.47* 1.32* 1.27*  CALCIUM 9.2 9.1 8.7  MG  --  2.2  --    Estimated Creatinine Clearance: 47.9 mL/min (by C-G formula based on Cr of 1.27).   LIVER  Recent Labs Lab 08/01/14 1850 08/02/14 0320 08/03/14 0253  INR 1.53* 1.64* 1.81*     INFECTIOUS  Recent Labs Lab 08/01/14 2309  LATICACIDVEN 1.8     ENDOCRINE CBG (last 3)  No results for input(s): GLUCAP in the last 72 hours.       IMAGING x48h Ct Chest High Resolution  08/03/2014   CLINICAL DATA:  Interstitial lung disease, decreased O2 sats, prednisone taper, subsequent encounter.  EXAM: CT CHEST WITHOUT CONTRAST  TECHNIQUE: Multidetector CT imaging of the chest was performed following the standard protocol without intravenous contrast. High resolution imaging of the lungs, as well as inspiratory and expiratory imaging, was performed.  COMPARISON:  03/01/2014 and 11/09/2006.  FINDINGS: Mediastinum/Nodes: Mediastinal lymph nodes measure up to 1.3 cm in short axis in AP window (previously 1.0 cm). Hilar regions are difficult to definitively evaluate without IV contrast. No axillary adenopathy. Atherosclerotic calcification of the arterial vasculature, including coronary arteries. Pulmonary arteries and heart are enlarged. No pericardial effusion.  Lungs/Pleura: There are patchy areas of ground-glass with interstitial thickening, coarsening, mild architectural distortion and subpleural reticulation, without a definite zonal predominance. Dependent  atelectasis and/or scarring in the right hemi thorax, similar to 03/01/2014. Probable tiny left pleural effusion.  Upper abdomen: Visualized portions of the liver, gallbladder and right adrenal gland are unremarkable. A 1.9 cm nodule in the left adrenal gland is only partially imaged. Visualized portions of the spleen and stomach are grossly unremarkable. No upper abdominal adenopathy.  Musculoskeletal: No worrisome lytic or sclerotic lesions. Degenerative changes are seen in the spine.  IMPRESSION: 1. Pulmonary parenchymal pattern of patchy ground-glass, mild interstitial thickening, coarsening, mild architectural distortion and subpleural reticulation, without a definite zonal predominance, suspicious for pulmonary edema or atypical/viral pneumonia superimposed on interstitial lung disease. Evaluation of underlying interstitial lung disease, and assessment of progression, are difficult given the suspected superimposed acute findings. It can be said, however, that the findings are not consistent with usual interstitial pneumonitis (UIP) and may be due to  underlying postinflammatory fibrosis. 2. Tiny left pleural effusion. 3. Enlarged pulmonary arteries, indicative of pulmonary arterial hypertension. 4. Slight enlargement of mediastinal lymph nodes, likely reactive. 5. Coronary artery calcification. 6. Left adrenal nodule, incompletely imaged.   Electronically Signed   By: Leanna Battles M.D.   On: 08/03/2014 08:36   Dg Chest Port 1 View  08/01/2014   CLINICAL DATA:  Shortness of breath  EXAM: PORTABLE CHEST - 1 VIEW  COMPARISON:  March 19, 2014  FINDINGS: The mediastinal contour is normal. The heart size is enlarged. The aorta is tortuous. There is increased bilateral pulmonary interstitium. Patchy consolidation of left lung base is noted. There is no pleural effusion. The visualized skeletal structures are stable.  IMPRESSION: Congestive heart failure.  Patchy consolidation of left lung base, superimposed  pneumonia is not excluded.   Electronically Signed   By: Sherian Rein M.D.   On: 08/01/2014 19:18        ASSESSMENT / PLAN:  Acute on Chronic Hypoxic Respiratory failure ILD (presumed NSIP by CT scan) OSA on CPAP Secondary PAH Decompensated diastolic HF Decompensated Cor pulmonale  Probable OHS w/ substantial weight gain and worsening deconditioning  Af (currently NSR but on amiodarone) Recent viral illness  Obesity htn  Prior PE/DVT   Multi-factorial Acute on Chronic respiratory failure on the basis of what is likely some combination of decompensated diastolic HF,cor pulmonale, obesity hypoventilation, and deconditioning super-imposed on underlying ILD (presumed NSIP v Autoimmune ILD by CT scan).   Her ILD seemed initially somewhat steroid responsive however she had substantial wt gain resulted in return of dyspnea,  and worsening edema ultimately resulting in the decision to decrease her pred. She has since had an episode of what seemed like a viral illness. Unclear to what extent this is playing on her current symptom burden but she feels that her dyspnea has dramatically worsened since then. Probably no one intervention here to fix things. Suspect we can make some impact on her dyspnea but ultimately will really need to focus on what her O2 demand at home will be as well    - overall still stame 15L [place] as of 08/03/2014   Recs Push diuresis Titrate O2-->may need oximizer.  F/u on viral panel Given wheeznig and amio intake  increase  Steroids to IV DC amio Cont current abx for now, but don't think she's infected  - check PCT and if ok, dc abx Cont anticoagulation  Hold off on bronch BAL - this is BAL - not biopsy - BAL will be low yield as of now    Dr. Kalman Shan, M.D., [facility].C.P Pulmonary and Critical Care Medicine Staff Physician [facility] Pager: [phone], If no answer or between  15:00h - 7:00h: call [phone]   [phone]  08/03/2014 10:53 AM

## 2014-08-03 NOTE — Progress Notes (Addendum)
[facility] PROGRESS NOTE  [name] [name] ZOX:[id number] DOB: 1931-06-20 DOA: 08/01/2014 PCP: Kerby Nora, MD  Brief Summary  The patient is an 50-yo female with history of HTN, GERD, gout, a-fib, hx of DVT and PE on coumadin, small AAA, anemia, CKD II/III, diastolic heart failure, interstitial lung disease on 3.5L home oxygen and recently started on steroids without benefit, who presented with shortness of breath.  She was started on 40 mg of prednisone daily in January 2016. Her shortness of breath has gotten progressively worse since tapering her steroids. She increased her oxygen to 5 L but continued to have dyspnea. She developed a fever to 100.6 Fahrenheit and a cough productive of mucus. She presented to the emergency department for evaluation. She has gained a proximally 20 pounds since January. In the emergency department, she had mild AK I with a troponin of 0.1, BNP 658, white blood cell count 14.2. Her chest x-ray demonstrated congestive heart failure changes with patchy left lung infiltrate.  Pulmonology was consulted.  Assessment/Plan  Acute on Chronic Respiratory Failure, multifactorial due to ILD, Pulmonary HTN, Diastolic HF, and possible CAP  - BIPAP prn, currently on venti mask 45%  - Continue tx for diastolic heart failure -  Continue ceftriaxone and azithromycin day 3 -  Continue IV antibiotics, Nebulizer treatments -  Steroids per pulm -  Appreciate PCCM assistance -  CT report pending -  May need bronch with biopsy:  Currently on warfarin so if planning possible procedure, would need to hold >> I will discontinue for now   Acute on chronic diastolic congestive heart failure: 2-D echo on 02/23/14 showed EF 60-65% with a grade 1 diastolic dysfunction. Patient is on torsemide 40 mg twice a day at home.  BNP is 658, body weight increased to 20 pounds, congestion on CXR -  Continue IV lasix 80 mg bid -  Daily weights -  Strict I/O:  -1.8L  CAP; chest x ray with  patchy consolidation Left Lung base.  - Continue with IV ceftriaxone and azithro, day 2 - Influenza neg - Respiratory panel pending.  - Strep Pneumonia negative.  - Legionella negative.  -  D/c tamiflu  Acute on CKD stage II, creatinine trending down with diuresis Monitor on IV lasix. Hold on getting renal US right now,.  Elevated trop: 0.10 and approximately stable. It is most likely due to congestive heart failure exacerbation and stress. Patient does not have chest pain. EKG has no significant change compared with previous EKG. -trop x 3:  Trended down slightly to 0.06 as creatinine improved -repeat EKG -continue aspirin, Lipitor -ECHO pending.   HTN, blood pressures elevated -  Continue diuresis and tapering steroids -  Consider increasing dilt -  Avoid BB given wheeze/respiratory distress  Paroxysmal atrial Fibrillation: CHA2DS2-VASc Score is 5, need oral anticoagulation. Patient is on coumadin at home. INR is 1.53 on admission, subtherapeutic.  Needed multiple cardioversions previously and was only finally maintained on amiodarone -Heparin bridging given high CHADs score -hold coumadin given possibility of bronch  - d/c amiodarone give worsening ILD for now - Cardiology to please consult and assist with determining whether it is safe to stop amiodarone or whether there is an alternative. - Continue Cardizem -  Tele:  NSR  Remote history of DVT and pulmonary embolism: 6 years ago which was treated. No signs of new DVT -  IV heparin  Right shin oozing lesion: -consult to wound care team  Normocytic anemia -  Iron studies, b12,  folate, TSH  Hypokalemia due to diuresis -  Oral potassium repletion  Leukocytosis likely secondary to steroids  Diet:  Healthy heart Access:  PIV IVF:  off Proph:  Heparin gtt  Code Status: Full Family Communication: patient alone Disposition Plan: pending further improvement in  breathing   Consultants:  Pulm  Cardiology  Procedures:  CXR  HRCT chest  Antibiotics:  Ceftriaxone 3/21 >>  Azithromycin 3/21 >>  HPI/Subjective:  Feeling better after BM this AM.  Still Linde Wilensky of breath. Cough and was able to give one sputum sample. Denies diarrhea.  Objective: Filed Vitals:   08/03/14 0208 08/03/14 0300 08/03/14 0400 08/03/14 0804  BP: 154/58 165/69 165/69 140/65  Pulse: 61 60 64 67  Temp:  97.1 F (36.2 C)  97.3 F (36.3 C)  TempSrc:  Oral  Oral  Resp: 22 22 21 23   Height:      Weight:      SpO2: 95% 93% 95% 95%    Intake/Output Summary (Last 24 hours) at 08/03/14 0824 Last data filed at 08/03/14 0700  Gross per 24 hour  Intake 1146.25 ml  Output   2950 ml  Net -1803.75 ml   Filed Weights   08/01/14 2130 08/01/14 2223 08/02/14 0447  Weight: 128.822 kg (284 lb) 132.8 kg (292 lb 12.3 oz) 132.6 kg (292 lb 5.3 oz)    Exam:   General:  Adult female, mild respiratory distress using abdominal muscles to assist with breathing  HEENT:  NCAT, MMM  Cardiovascular:  RRR, nl S1, S2 no mrg, 2+ pulses, warm extremities  Respiratory:  Rales at bilateral bases, worse at the left base and heard to the mid back bilaterally, positive wheeze, no obvious rhonchi, no increased WOB  Abdomen:   NABS, soft, NT/ND  MSK:   Normal tone and bulk, 1+ bilateral pitting LEE  Neuro:  Grossly moves all extremities  Skin: Skin graft right anterior shin  Data Reviewed: Basic Metabolic Panel:  Recent Labs Lab 08/01/14 1850 08/02/14 0320 08/03/14 0253  NA 142 146* 144  K 3.8 3.4* 3.4*  CL 100 100 100  CO2 29 34* 33*  GLUCOSE 107* 102* 81  BUN 27* 25* 19  CREATININE 1.47* 1.32* 1.27*  CALCIUM 9.2 9.1 8.7  MG  --  2.2  --    Liver Function Tests: No results for input(s): AST, ALT, ALKPHOS, BILITOT, PROT, ALBUMIN in the last 168 hours. No results for input(s): LIPASE, AMYLASE in the last 168 hours. No results for input(s): AMMONIA in the last 168  hours. CBC:  Recent Labs Lab 08/01/14 1850 08/02/14 0320 08/03/14 0253  WBC 14.2* 12.5* 13.8*  HGB 12.4 11.5* 10.9*  HCT 40.9 37.8 35.8*  MCV 91.3 91.1 91.6  PLT 248 249 263   Cardiac Enzymes:  Recent Labs Lab 08/01/14 2309 08/02/14 0320 08/02/14 1030  TROPONINI 0.11* 0.12* 0.06*   BNP (last 3 results)  Recent Labs  08/01/14 1850  BNP 658.4*    ProBNP (last 3 results)  Recent Labs  02/22/14 0947 02/24/14 0332 03/07/14 0648  PROBNP 2319.0* 2410.0* 437.5    CBG: No results for input(s): GLUCAP in the last 168 hours.  Recent Results (from the past 240 hour(s))  MRSA PCR Screening     Status: Abnormal   Collection Time: 08/02/14 10:59 AM  Result Value Ref Range Status   MRSA by PCR POSITIVE (A) NEGATIVE Final    Comment:        The GeneXpert MRSA Assay (FDA approved  for NASAL specimens only), is one component of a comprehensive MRSA colonization surveillance program. It is not intended to diagnose MRSA infection nor to guide or monitor treatment for MRSA infections. RESULT CALLED TO, READ BACK BY AND VERIFIED WITH: SHELTON RN 13:20 08/02/14 ([email or web address])      Studies: Dg Chest Port 1 View  08/01/2014   CLINICAL DATA:  Shortness of breath  EXAM: PORTABLE CHEST - 1 VIEW  COMPARISON:  March 19, 2014  FINDINGS: The mediastinal contour is normal. The heart size is enlarged. The aorta is tortuous. There is increased bilateral pulmonary interstitium. Patchy consolidation of left lung base is noted. There is no pleural effusion. The visualized skeletal structures are stable.  IMPRESSION: Congestive heart failure.  Patchy consolidation of left lung base, superimposed pneumonia is not excluded.   Electronically Signed   By: Sherian Rein M.D.   On: 08/01/2014 19:18    Scheduled Meds: . allopurinol  300 mg Oral Daily  . amiodarone  100 mg Oral Daily  . antiseptic oral rinse  7 mL Mouth Rinse BID  . aspirin EC  81 mg Oral Daily  . atorvastatin  10 mg Oral Daily   . azithromycin  500 mg Intravenous Q24H  . budesonide  0.5 mg Nebulization BID  . cefTRIAXone (ROCEPHIN)  IV  1 g Intravenous Q24H  . diltiazem  120 mg Oral Daily  . escitalopram  20 mg Oral Daily  . famotidine  20 mg Oral Daily  . furosemide  80 mg Intravenous BID  . gabapentin  300 mg Oral Daily  . levalbuterol  0.63 mg Nebulization QID  . loratadine  10 mg Oral Daily  . oseltamivir  30 mg Oral BID  . potassium chloride  40 mEq Oral BID  . predniSONE  20 mg Oral Q breakfast  . Warfarin - Pharmacist Dosing Inpatient   Does not apply q1800   Continuous Infusions: . heparin 1,500 Units/hr (08/03/14 4696)    Principal Problem:   SOB (shortness of breath) Active Problems:   OBESITY-MORBID (>100')   Essential hypertension   EMBOLISM & INFARCTION, IATROGENIC PULMONARY   Atrial fibrillation   DIASTOLIC HEART FAILURE, CHRONIC   Abdominal aortic aneurysm   Allergic rhinitis   CHF exacerbation   Hx pulmonary embolism   History of DVT (deep vein thrombosis)   CAP (community acquired pneumonia)   Interstitial lung disease   Acute on chronic respiratory failure    Time spent: 30 min    Ancelmo Hunt  [facility] Pager [phone]. If 7PM-7AM, please contact night-coverage at [email or web address], password [facility] 08/03/2014, 8:24 AM  LOS: 2 days

## 2014-08-03 NOTE — Consult Note (Signed)
Patient ID: [name] [name] MRN: [id number] DOB/AGE: 01/03/1932 81 y.o.  Admit date: 08/01/2014 Referring Physician: Short  Primary Cardiologist: Mariah Milling Reason for Consultation: Atrial fibrillation, acute on chronic diastolic CHF  HPI: 81 yo female with history of chronic diastolic CHF, persistent atrial fibrillation on amiodarone, DVT/PE, OSA, obesity hypoventilation syndrome, HTN, HLD, obesity, interstitial lung disease, CKD admitted with worsened dyspnea and hypoxemia. She has been followed closely by pulmonary as an outpatient. She has been tried on steroids recently and has had significant weight gain with volume overload so her steroids were decreased. She was admitted with worsening dyspnea in setting of multiple chronic respiratory issues as above. She is being diuresed now for volume overload. Pulmonary following and there appears to be worsening of her lung disease with possible viral component leading to acute decompensation.   She has maintained sinus rhythm on amiodarone over the last 6 months. Prior to that she was on Flecainide but this was stopped in October by the CHF team due to renal failure and worsened right heart failure. The question now is if her amiodarone should be stopped with apparent worsening of her ILD.   She reports SOB, LE edema today. No chest pain.   Past Medical History  Diagnosis Date  . Congestive heart failure, unspecified     a. ECHO 6/0: EF 55-60%, mild LVH, mildly dilated RV w. mildly dec fx, RVSP 67  . Chronic atrial fibrillation     failed cardioversion in past  -repeat DC-CV on October 5th, 2010  . Personal history of DVT (deep vein thrombosis) 2008  . Obstructive sleep apnea   . HTN (hypertension)   . Morbid obesity   . Shingles     with postherpetic neuraigia  . GERD (gastroesophageal reflux disease)   . Hyperlipidemia   . Meningitis 1950s  . Gallstones     s/p cholecystectomy  . CAD (coronary artery disease)     nonobstructive by  cath 8/10 - LAD 40-50% w mild PAH mean 29 w PVR 3.2 Woods  . AAA (abdominal aortic aneurysm)     small  . PE (pulmonary embolism)     history of  . Anemia   . Shortness of breath dyspnea   . Chronic kidney disease     ckd stage 2  . Lung interstitial disease   . Hypoxemia     Family History  Problem Relation Age of Onset  . Heart failure Mother   . Depression Mother   . Ovarian cancer Paternal Grandmother   . Heart disease Paternal Grandfather   . Obesity Other     History   Social History  . Marital Status: Married    Spouse Name: N/A  . Number of Children: N/A  . Years of Education: N/A   Occupational History  . Not on file.   Social History Main Topics  . Smoking status: Never Smoker   . Smokeless tobacco: Never Used  . Alcohol Use: Yes     Comment: occasional  . Drug Use: No  . Sexual Activity: Not on file   Other Topics Concern  . Not on file   Social History Narrative   Widowed. Retired from Teacher, English as a foreign language.    Past Surgical History  Procedure Laterality Date  . Appendectomy  1947  . Tonsillectomy  1946  . Hospitalized with meningitis at [place]  1960  . Cholecystectomy    . Cardioversion  01/2007    x2  . Right heart catheterization  N/A 02/28/2014    Procedure: RIGHT HEART CATH;  Surgeon: Laurey Moralealton S McLean, MD;  [facility];  Service: Cardiovascular;  Laterality: N/A;    No Known Allergies   Hospital Medications:  . allopurinol  300 mg Oral Daily  . antiseptic oral rinse  7 mL Mouth Rinse BID  . aspirin EC  81 mg Oral Daily  . atorvastatin  10 mg Oral Daily  . azithromycin  500 mg Intravenous Q24H  . budesonide  0.5 mg Nebulization BID  . cefTRIAXone (ROCEPHIN)  IV  1 g Intravenous Q24H  . diltiazem  120 mg Oral Daily  . escitalopram  20 mg Oral Daily  . famotidine  20 mg Oral Daily  . furosemide  80 mg Intravenous BID  . gabapentin  300 mg Oral Daily  . levalbuterol  0.63 mg Nebulization QID  . loratadine  10 mg Oral Daily  .  potassium chloride  40 mEq Oral BID  . predniSONE  20 mg Oral Q breakfast    Prior to Admission medications   Medication Sig Start Date End Date Taking? Authorizing Provider  allopurinol (ZYLOPRIM) 300 MG tablet Take 1 tablet (300 mg total) by mouth daily. 06/08/14  Yes Amy Michelle NasutiE Bedsole, MD  amiodarone (PACERONE) 200 MG tablet Take 100 mg by mouth daily.  04/16/14  Yes Historical Provider, MD  aspirin 81 MG tablet Take 81 mg by mouth daily.   Yes Historical Provider, MD  atorvastatin (LIPITOR) 10 MG tablet Take 1 tablet (10 mg total) by mouth daily. 07/18/14  Yes Amy E Bedsole, MD  budesonide (PULMICORT) 0.5 MG/2ML nebulizer solution Take 2 mLs (0.5 mg total) by nebulization 2 (two) times daily. 04/19/14  Yes Amy Michelle NasutiE Bedsole, MD  cetirizine (ZYRTEC ALLERGY) 10 MG tablet Take 10 mg by mouth as needed for allergies.    Yes Historical Provider, MD  diltiazem (CARDIZEM CD) 120 MG 24 hr capsule Take 1 capsule (120 mg total) by mouth daily. 07/18/14  Yes Amy E Bedsole, MD  escitalopram (LEXAPRO) 20 MG tablet Take 1 tablet (20 mg total) by mouth daily. 05/02/14  Yes Amy Michelle NasutiE Bedsole, MD  famotidine (PEPCID) 20 MG tablet Take 1 tablet (20 mg total) by mouth daily. 04/15/14  Yes Antonieta Ibaimothy J Gollan, MD  gabapentin (NEURONTIN) 300 MG capsule Take 1 capsule (300 mg total) by mouth daily. 04/19/14  Yes Amy Michelle NasutiE Bedsole, MD  levalbuterol (XOPENEX) 0.63 MG/3ML nebulizer solution Take 3 mLs (0.63 mg total) by nebulization 4 (four) times daily. 03/02/14  Yes Russella DarAllison L Ellis, NP  OVER THE COUNTER MEDICATION Inhale 1 application into the lungs at bedtime. USES BIPAP EVERY BEDTIME FOR SLEEP   Yes Historical Provider, MD  potassium chloride SA (K-DUR,KLOR-CON) 20 MEQ tablet Take 20 mEq by mouth daily.   Yes Historical Provider, MD  predniSONE (DELTASONE) 5 MG tablet 15mg  daily for one week, 10mg  daily for one week, 5mg  daily for one week 07/18/14  Yes Lupita Leashouglas B McQuaid, MD  torsemide (DEMADEX) 20 MG tablet Take 2 tablets (40 mg total) by  mouth 2 (two) times daily. 07/06/14  Yes Antonieta Ibaimothy J Gollan, MD  warfarin (COUMADIN) 2.5 MG tablet Take as directed. Patient taking differently: Take 2.5-3.75 mg by mouth daily at 6 PM. Takes 1.5 tabs on Tues, Thurs and Sun Takes 1 tab all other days 06/27/14  Yes Amy E Ermalene SearingBedsole, MD  colchicine 0.6 MG tablet Take 0.6 mg by mouth as needed (for flareup).  01/11/14   Historical Provider, MD  Review of systems complete and found to be negative unless listed above    Physical Exam: Blood pressure 146/125, pulse 67, temperature 97.3 F (36.3 C), temperature source Oral, resp. rate 14, height  (1.626 m), weight 292 lb 5.3 oz (132.6 kg), SpO2 94 %.    General: Morbidly obese WF, NAD  HEENT: OP clear, mucus membranes moist  SKIN: warm, dry. No rashes.  Neuro: No focal deficits  Musculoskeletal: Muscle strength 5/5 all ext  Psychiatric: Mood and affect normal  Neck: Unable to assess JVD given pt size, no carotid bruits, no thyromegaly, no lymphadenopathy.  Lungs: Bibasilar crackles.   Cardiovascular: Regular rate and rhythm. No murmurs, gallops or rubs.  Abdomen:Soft. Bowel sounds present. Non-tender.  Extremities: 3+ bilateral lower extremity edema.   Labs:   Lab Results  Component Value Date   WBC 13.8* 08/03/2014   HGB 10.9* 08/03/2014   HCT 35.8* 08/03/2014   MCV 91.6 08/03/2014   PLT 263 08/03/2014    Recent Labs Lab 08/03/14 0253  NA 144  K 3.4*  CL 100  CO2 33*  BUN 19  CREATININE 1.27*  CALCIUM 8.7  GLUCOSE 81   Lab Results  Component Value Date   CKTOTAL 50 06/29/2009   CKMB 1.0 06/29/2009   TROPONINI 0.06* 08/02/2014     Chest CT 08/03/14: 1. Pulmonary parenchymal pattern of patchy ground-glass, mild interstitial thickening, coarsening, mild architectural distortion and subpleural reticulation, without a definite zonal predominance, suspicious for pulmonary edema or atypical/viral pneumonia superimposed on interstitial lung disease. Evaluation of  underlying interstitial lung disease, and assessment of progression, are difficult given the suspected superimposed acute findings. It can be said, however, that the findings are not consistent with usual interstitial pneumonitis (UIP) and may be due to underlying postinflammatory fibrosis. 2. Tiny left pleural effusion. 3. Enlarged pulmonary arteries, indicative of pulmonary arterial hypertension. 4. Slight enlargement of mediastinal lymph nodes, likely reactive. 5. Coronary artery calcification. 6. Left adrenal nodule, incompletely imaged.'  Chest x-ray:  Congestive heart failure. Patchy consolidation of left lung base, superimposed pneumonia is not excluded.  EKG: Sinus. Non-specific T wave abnormalities.   ASSESSMENT AND PLAN:   1. Acute on chronic diastolic CHF/Acute respiratory failure: Agree that she is volume overloaded and this is likely contributing at least in part to her acute respiratory failure. Agree with diuresis with IV Lasix. She is now down 1.9 liters since admission. Would continue IV Lasix.   2. Atrial fibrillation, paroxysmal: Sinus today. Appears to have been in sinus rhythm at all checks over the last six months. We are asked to comment on her amiodarone therapy given worsened ILD. I have discussed this with Dr. Marchelle Gearing at the bedside and agree that it is best to stop her amiodarone therapy. Her treatment options for atrial fibrillation are limited. Continue Cardizem. If she has recurrence of atrial fib will likely not tolerate well. May need EP involvement before discharge.   We will follow along with you.    Signed: Verne Carrow, MD 08/03/2014, 10:20 AM

## 2014-08-03 NOTE — Progress Notes (Addendum)
Addendum: possible need for bronch, orders to continue heparin but hold coumadin for now.  08/03/2014 8:40 AM   ANTICOAGULATION CONSULT NOTE  Pharmacy Consult for Warfarin and Heparin Indication: atrial fibrillation  No Known Allergies  Patient Measurements: Height: 5\' 4"  (162.6 cm) Weight: 292 lb 5.3 oz (132.6 kg) IBW/kg (Calculated) : 54.7 Heparin Dosing Weight: 88 kg  Vital Signs: Temp: 97.3 F (36.3 C) (03/23 0804) Temp Source: Oral (03/23 0804) BP: 140/65 mmHg (03/23 0804) Pulse Rate: 67 (03/23 0804)  Labs:  Recent Labs  08/01/14 1850 08/01/14 2309 08/02/14 0320 08/02/14 1030 08/02/14 1310 08/03/14 0253  HGB 12.4  --  11.5*  --   --  10.9*  HCT 40.9  --  37.8  --   --  35.8*  PLT 248  --  249  --   --  263  LABPROT 18.5*  --  19.6*  --   --  21.1*  INR 1.53*  --  1.64*  --   --  1.81*  HEPARINUNFRC  --   --   --   --  0.30 0.28*  CREATININE 1.47*  --  1.32*  --   --  1.27*  TROPONINI  --  0.11* 0.12* 0.06*  --   --     Estimated Creatinine Clearance: 47.9 mL/min (by C-G formula based on Cr of 1.27).  Medical History: Past Medical History  Diagnosis Date  . Congestive heart failure, unspecified     a. ECHO 6/0: EF 55-60%, mild LVH, mildly dilated RV w. mildly dec fx, RVSP 67  . Chronic atrial fibrillation     failed cardioversion in past  -repeat DC-CV on October 5th, 2010  . Personal history of DVT (deep vein thrombosis) 2008  . Obstructive sleep apnea   . HTN (hypertension)   . Morbid obesity   . Shingles     with postherpetic neuraigia  . GERD (gastroesophageal reflux disease)   . Hyperlipidemia   . Meningitis 1950s  . Gallstones     s/p cholecystectomy  . CAD (coronary artery disease)     nonobstructive by cath 8/10 - LAD 40-50% w mild PAH mean 29 w PVR 3.2 Woods  . AAA (abdominal aortic aneurysm)     small  . PE (pulmonary embolism)     history of  . Anemia   . Shortness of breath dyspnea   . Chronic kidney disease     ckd stage 2  .  Lung interstitial disease   . Hypoxemia    Infusions:  . heparin 1,500 Units/hr (08/03/14 [id number])    Assessment: 81yo female with history of Afib presents with worsening SOB. Pharmacy is consulted to dose warfarin and heparin bridge for atrial fibrillation. INR on presentation is SUBtherapeutic at 1.5. CBC is wnl, sCr 1.5, BNP 658, Trop 0.1.  HL just below goal this morning at 0.28 on 1500 units/hr. INR trending up to 1.8. No bleeding issues noted overnight. CBC stable  PTA warfarin dose: 3.75mg  Tues, Thurs, Sun and 2.5mg  AODs.  Goal of Therapy:  INR 2-3 Heparin level 0.3-0.7 units/ml Monitor platelets by anticoagulation protocol: Yes   Plan:  Increase heparin infusion to 1650 units/hr Check anti-Xa level daily while on heparin Continue to monitor H&H and platelets  Warfarin 5mg  tonight x1 Daily INR/CBC Monitor s/sx of bleeding  Sheppard CoilFrank Wilson PharmD., BCPS Clinical Pharmacist Pager [phone] 08/03/2014 8:25 AM

## 2014-08-04 DIAGNOSIS — J9621 Acute and chronic respiratory failure with hypoxia: Secondary | ICD-10-CM

## 2014-08-04 DIAGNOSIS — I5033 Acute on chronic diastolic (congestive) heart failure: Principal | ICD-10-CM

## 2014-08-04 DIAGNOSIS — Z86718 Personal history of other venous thrombosis and embolism: Secondary | ICD-10-CM

## 2014-08-04 LAB — PROTIME-INR
INR: 1.96 — ABNORMAL HIGH (ref 0.00–1.49)
Prothrombin Time: 22.5 seconds — ABNORMAL HIGH (ref 11.6–15.2)

## 2014-08-04 LAB — BASIC METABOLIC PANEL
Anion gap: 14 (ref 5–15)
BUN: 22 mg/dL (ref 6–23)
CHLORIDE: 100 mmol/L (ref 96–112)
CO2: 29 mmol/L (ref 19–32)
CREATININE: 1.26 mg/dL — AB (ref 0.50–1.10)
Calcium: 9.5 mg/dL (ref 8.4–10.5)
GFR calc non Af Amer: 39 mL/min — ABNORMAL LOW (ref >90)
GFR, EST AFRICAN AMERICAN: 45 mL/min — AB (ref >90)
Glucose, Bld: 141 mg/dL — ABNORMAL HIGH (ref 70–99)
Potassium: 4.5 mmol/L (ref 3.5–5.1)
Sodium: 143 mmol/L (ref 135–145)

## 2014-08-04 LAB — CBC
HCT: 39.8 % (ref 36.0–46.0)
HEMOGLOBIN: 12.1 g/dL (ref 12.0–15.0)
MCH: 27.8 pg (ref 26.0–34.0)
MCHC: 30.4 g/dL (ref 30.0–36.0)
MCV: 91.5 fL (ref 78.0–100.0)
Platelets: 292 10*3/uL (ref 150–400)
RBC: 4.35 MIL/uL (ref 3.87–5.11)
RDW: 17.9 % — ABNORMAL HIGH (ref 11.5–15.5)
WBC: 15.2 10*3/uL — ABNORMAL HIGH (ref 4.0–10.5)

## 2014-08-04 LAB — IRON AND TIBC
Iron: 24 ug/dL — ABNORMAL LOW (ref 42–145)
SATURATION RATIOS: 7 % — AB (ref 20–55)
TIBC: 359 ug/dL (ref 250–470)
UIBC: 335 ug/dL (ref 125–400)

## 2014-08-04 LAB — VITAMIN B12: VITAMIN B 12: 1314 pg/mL — AB (ref 211–911)

## 2014-08-04 LAB — PROCALCITONIN

## 2014-08-04 LAB — FERRITIN: Ferritin: 58 ng/mL (ref 10–291)

## 2014-08-04 LAB — HEPARIN LEVEL (UNFRACTIONATED): Heparin Unfractionated: 0.58 IU/mL (ref 0.30–0.70)

## 2014-08-04 LAB — TSH: TSH: 1.149 u[IU]/mL (ref 0.350–4.500)

## 2014-08-04 MED ORDER — FUROSEMIDE 10 MG/ML IJ SOLN
80.0000 mg | Freq: Three times a day (TID) | INTRAMUSCULAR | Status: DC
  Administered 2014-08-04 (×3): 80 mg via INTRAVENOUS
  Filled 2014-08-04 (×4): qty 8

## 2014-08-04 MED ORDER — CHLORHEXIDINE GLUCONATE CLOTH 2 % EX PADS
6.0000 | MEDICATED_PAD | Freq: Every day | CUTANEOUS | Status: DC
  Administered 2014-08-05 – 2014-08-06 (×2): 6 via TOPICAL

## 2014-08-04 MED ORDER — MUPIROCIN 2 % EX OINT
1.0000 "application " | TOPICAL_OINTMENT | Freq: Two times a day (BID) | CUTANEOUS | Status: DC
  Administered 2014-08-04 – 2014-08-07 (×6): 1 via NASAL
  Filled 2014-08-04: qty 22

## 2014-08-04 MED ORDER — WARFARIN SODIUM 4 MG PO TABS
4.0000 mg | ORAL_TABLET | Freq: Once | ORAL | Status: AC
  Administered 2014-08-04: 4 mg via ORAL
  Filled 2014-08-04: qty 1

## 2014-08-04 MED ORDER — FERROUS SULFATE 325 (65 FE) MG PO TABS
325.0000 mg | ORAL_TABLET | Freq: Two times a day (BID) | ORAL | Status: DC
  Administered 2014-08-04 – 2014-08-07 (×6): 325 mg via ORAL
  Filled 2014-08-04 (×9): qty 1

## 2014-08-04 MED ORDER — DILTIAZEM HCL ER COATED BEADS 240 MG PO CP24
240.0000 mg | ORAL_CAPSULE | Freq: Every day | ORAL | Status: DC
  Administered 2014-08-04 – 2014-08-07 (×4): 240 mg via ORAL
  Filled 2014-08-04 (×4): qty 1

## 2014-08-04 MED ORDER — WARFARIN - PHARMACIST DOSING INPATIENT: Freq: Every day | Status: DC

## 2014-08-04 NOTE — Progress Notes (Signed)
DAILY PROGRESS NOTE  Subjective:  Breathing is a little better today. Diuresing. -4L negative. Bedside echo briefly reviewed personally - looks like LV function is normal, RV mildly dilated - RVSP not yet assessed.  Objective:  Temp:  [95.8 F (35.4 C)-98 F (36.7 C)] 95.8 F (35.4 C) (03/24 0800) Pulse Rate:  [55-76] 61 (03/24 0800) Resp:  [17-28] 28 (03/24 1015) BP: (130-173)/(59-86) 160/76 mmHg (03/24 0800) SpO2:  [90 %-98 %] 94 % (03/24 1015) FiO2 (%):  [40 %-50 %] 40 % (03/24 0945) Weight change:   Intake/Output from previous day: 03/23 0701 - 03/24 0700 In: 1173.5 [P.O.:480; I.V.:393.5; IV Piggyback:300] Out: 2900 [Urine:2900]  Intake/Output from this shift: Total I/O In: 409.5 [P.O.:360; I.V.:49.5] Out: 350 [Urine:350]  Medications: Current Facility-Administered Medications  Medication Dose Route Frequency Provider Last Rate Last Dose  . allopurinol (ZYLOPRIM) tablet 300 mg  300 mg Oral Daily Lorretta Harp, MD   300 mg at 08/04/14 0934  . antiseptic oral rinse (CPC / CETYLPYRIDINIUM CHLORIDE 0.05%) solution 7 mL  7 mL Mouth Rinse BID Belkys A Regalado, MD   7 mL at 08/04/14 1000  . aspirin EC tablet 81 mg  81 mg Oral Daily Lorretta Harp, MD   81 mg at 08/04/14 0934  . atorvastatin (LIPITOR) tablet 10 mg  10 mg Oral Daily Lorretta Harp, MD   10 mg at 08/04/14 0934  . azithromycin (ZITHROMAX) 500 mg in dextrose 5 % 250 mL IVPB  500 mg Intravenous Q24H Lorretta Harp, MD   500 mg at 08/03/14 2221  . budesonide (PULMICORT) nebulizer solution 0.5 mg  0.5 mg Nebulization BID Lorretta Harp, MD   0.5 mg at 08/04/14 0944  . cefTRIAXone (ROCEPHIN) 1 g in dextrose 5 % 50 mL IVPB - Premix  1 g Intravenous Q24H Lorretta Harp, MD   1 g at 08/03/14 2218  . diltiazem (CARDIZEM CD) 24 hr capsule 240 mg  240 mg Oral Daily Renae Fickle, MD   240 mg at 08/04/14 0934  . escitalopram (LEXAPRO) tablet 20 mg  20 mg Oral Daily Lorretta Harp, MD   20 mg at 08/04/14 0935  . famotidine (PEPCID) tablet 20 mg  20 mg  Oral Daily Lorretta Harp, MD   20 mg at 08/04/14 0934  . furosemide (LASIX) injection 80 mg  80 mg Intravenous TID Renae Fickle, MD   80 mg at 08/04/14 0935  . gabapentin (NEURONTIN) capsule 300 mg  300 mg Oral Daily Lorretta Harp, MD   300 mg at 08/04/14 0934  . heparin ADULT infusion 100 units/mL (25000 units/250 mL)  1,650 Units/hr Intravenous Continuous Earnie Larsson, RPH 16.5 mL/hr at 08/04/14 0700 1,650 Units/hr at 08/04/14 0700  . levalbuterol (XOPENEX) nebulizer solution 0.63 mg  0.63 mg Nebulization QID Lorretta Harp, MD   0.63 mg at 08/04/14 0945  . loratadine (CLARITIN) tablet 10 mg  10 mg Oral Daily Lorretta Harp, MD   10 mg at 08/04/14 0934  . methylPREDNISolone sodium succinate (SOLU-MEDROL) 125 mg/2 mL injection 60 mg  60 mg Intravenous Q12H Kalman Shan, MD   60 mg at 08/04/14 1019  . warfarin (COUMADIN) tablet 4 mg  4 mg Oral ONCE-1800 Earnie Larsson, [facility]      . Warfarin - Pharmacist Dosing Inpatient   Does not apply q1800 Earnie Larsson, [facility]        Physical Exam: General appearance: alert and no distress Neck: no carotid bruit and no JVD Lungs: diminished breath sounds bilaterally  and rales bibasilar Heart: regular rate and rhythm Abdomen: soft, non-tender; bowel sounds normal; no masses,  no organomegaly and morbidly obese Extremities: edema 1+ LE edema Pulses: 2+ and symmetric Skin: Skin color, texture, turgor normal. No rashes or lesions Neurologic: Grossly normal  Lab Results: Results for orders placed or performed during the hospital encounter of 08/01/14 (from the past 48 hour(s))  Heparin level (unfractionated)     Status: None   Collection Time: 08/02/14  1:10 PM  Result Value Ref Range   Heparin Unfractionated 0.30 0.30 - 0.70 IU/mL    Comment:        IF HEPARIN RESULTS ARE BELOW EXPECTED VALUES, AND PATIENT DOSAGE HAS BEEN CONFIRMED, SUGGEST FOLLOW UP TESTING OF ANTITHROMBIN III LEVELS.   Protime-INR     Status: Abnormal   Collection Time: 08/03/14  2:53 AM    Result Value Ref Range   Prothrombin Time 21.1 (H) 11.6 - 15.2 seconds   INR 1.81 (H) 0.00 - 1.49  CBC     Status: Abnormal   Collection Time: 08/03/14  2:53 AM  Result Value Ref Range   WBC 13.8 (H) 4.0 - 10.5 K/uL   RBC 3.91 3.87 - 5.11 MIL/uL   Hemoglobin 10.9 (L) 12.0 - 15.0 g/dL   HCT 35.8 (L) 36.0 - 46.0 %   MCV 91.6 78.0 - 100.0 fL   MCH 27.9 26.0 - 34.0 pg   MCHC 30.4 30.0 - 36.0 g/dL   RDW 18.4 (H) 11.5 - 15.5 %   Platelets 263 150 - 400 K/uL  Basic metabolic panel     Status: Abnormal   Collection Time: 08/03/14  2:53 AM  Result Value Ref Range   Sodium 144 135 - 145 mmol/L   Potassium 3.4 (L) 3.5 - 5.1 mmol/L   Chloride 100 96 - 112 mmol/L   CO2 33 (H) 19 - 32 mmol/L   Glucose, Bld 81 70 - 99 mg/dL   BUN 19 6 - 23 mg/dL   Creatinine, Ser 1.27 (H) 0.50 - 1.10 mg/dL   Calcium 8.7 8.4 - 10.5 mg/dL   GFR calc non Af Amer 39 (L) >90 mL/min   GFR calc Af Amer 45 (L) >90 mL/min    Comment: (NOTE) The eGFR has been calculated using the CKD EPI equation. This calculation has not been validated in all clinical situations. eGFR's persistently <90 mL/min signify possible Chronic Kidney Disease.    Anion gap 11 5 - 15  Heparin level (unfractionated)     Status: Abnormal   Collection Time: 08/03/14  2:53 AM  Result Value Ref Range   Heparin Unfractionated 0.28 (L) 0.30 - 0.70 IU/mL    Comment:        IF HEPARIN RESULTS ARE BELOW EXPECTED VALUES, AND PATIENT DOSAGE HAS BEEN CONFIRMED, SUGGEST FOLLOW UP TESTING OF ANTITHROMBIN III LEVELS.   Blood gas, arterial     Status: Abnormal   Collection Time: 08/03/14  5:02 AM  Result Value Ref Range   FIO2 0.45 %   Delivery systems VENTURI MASK    pH, Arterial 7.388 7.350 - 7.450   pCO2 arterial 57.3 (HH) 35.0 - 45.0 mmHg    Comment: CRITICAL RESULT CALLED TO, READ BACK BY AND VERIFIED WITH: PAULA TRIVETTE,RN AT 0512, BY KIMBERLY SMITH RRT,RCP ON 08/03/14    pO2, Arterial 82.6 80.0 - 100.0 mmHg   Bicarbonate 33.7 (H) 20.0  - 24.0 mEq/L   TCO2 35.5 0 - 100 mmol/L   Acid-Base Excess 8.6 (H)  0.0 - 2.0 mmol/L   O2 Saturation 94.8 %   Patient temperature 98.6    Collection site RIGHT RADIAL    Drawn by [phone]    Sample type ARTERIAL    Allens test (pass/fail) PASS PASS  Culture, sputum-assessment     Status: None   Collection Time: 08/03/14  8:57 AM  Result Value Ref Range   Specimen Description SPUTUM    Special Requests NONE    Sputum evaluation      MICROSCOPIC FINDINGS SUGGEST THAT THIS SPECIMEN IS NOT REPRESENTATIVE OF LOWER RESPIRATORY SECRETIONS. PLEASE RECOLLECT. Gram Stain Report Called to,Read Back By and Verified With: Thomasene Mohair RN 11:25 08/03/14 ([email or web address])    Report Status 08/03/2014 FINAL   Sedimentation rate     Status: Abnormal   Collection Time: 08/03/14 12:03 PM  Result Value Ref Range   Sed Rate 44 (H) 0 - 22 mm/hr  Procalcitonin - Baseline     Status: None   Collection Time: 08/03/14 12:03 PM  Result Value Ref Range   Procalcitonin <0.10 ng/mL    Comment:        Interpretation: PCT (Procalcitonin) <= 0.5 ng/mL: Systemic infection (sepsis) is not likely. Local bacterial infection is possible. (NOTE)         ICU PCT Algorithm               Non ICU PCT Algorithm    ----------------------------     ------------------------------         PCT < 0.25 ng/mL                 PCT < 0.1 ng/mL     Stopping of antibiotics            Stopping of antibiotics       strongly encouraged.               strongly encouraged.    ----------------------------     ------------------------------       PCT level decrease by               PCT < 0.25 ng/mL       >= 80% from peak PCT       OR PCT 0.25 - 0.5 ng/mL          Stopping of antibiotics                                             encouraged.     Stopping of antibiotics           encouraged.    ----------------------------     ------------------------------       PCT level decrease by              PCT >= 0.25 ng/mL       < 80% from peak PCT         AND PCT >= 0.5 ng/mL            Continuin g antibiotics                                              encouraged.       Continuing antibiotics  encouraged.    ----------------------------     ------------------------------     PCT level increase compared          PCT > 0.5 ng/mL         with peak PCT AND          PCT >= 0.5 ng/mL             Escalation of antibiotics                                          strongly encouraged.      Escalation of antibiotics        strongly encouraged.   Protime-INR     Status: Abnormal   Collection Time: 08/04/14  4:20 AM  Result Value Ref Range   Prothrombin Time 22.5 (H) 11.6 - 15.2 seconds   INR 1.96 (H) 0.00 - 1.49  CBC     Status: Abnormal   Collection Time: 08/04/14  4:20 AM  Result Value Ref Range   WBC 15.2 (H) 4.0 - 10.5 K/uL   RBC 4.35 3.87 - 5.11 MIL/uL   Hemoglobin 12.1 12.0 - 15.0 g/dL   HCT 39.8 36.0 - 46.0 %   MCV 91.5 78.0 - 100.0 fL   MCH 27.8 26.0 - 34.0 pg   MCHC 30.4 30.0 - 36.0 g/dL   RDW 17.9 (H) 11.5 - 15.5 %   Platelets 292 150 - 400 K/uL  Heparin level (unfractionated)     Status: None   Collection Time: 08/04/14  4:20 AM  Result Value Ref Range   Heparin Unfractionated 0.58 0.30 - 0.70 IU/mL    Comment:        IF HEPARIN RESULTS ARE BELOW EXPECTED VALUES, AND PATIENT DOSAGE HAS BEEN CONFIRMED, SUGGEST FOLLOW UP TESTING OF ANTITHROMBIN III LEVELS.   Basic metabolic panel     Status: Abnormal   Collection Time: 08/04/14  4:20 AM  Result Value Ref Range   Sodium 143 135 - 145 mmol/L   Potassium 4.5 3.5 - 5.1 mmol/L    Comment: DELTA CHECK NOTED   Chloride 100 96 - 112 mmol/L   CO2 29 19 - 32 mmol/L   Glucose, Bld 141 (H) 70 - 99 mg/dL   BUN 22 6 - 23 mg/dL   Creatinine, Ser 1.26 (H) 0.50 - 1.10 mg/dL   Calcium 9.5 8.4 - 10.5 mg/dL   GFR calc non Af Amer 39 (L) >90 mL/min   GFR calc Af Amer 45 (L) >90 mL/min    Comment: (NOTE) The eGFR has been calculated using the CKD EPI equation. This  calculation has not been validated in all clinical situations. eGFR's persistently <90 mL/min signify possible Chronic Kidney Disease.    Anion gap 14 5 - 15  Ferritin     Status: None   Collection Time: 08/04/14  4:20 AM  Result Value Ref Range   Ferritin 58 10 - 291 ng/mL    Comment: Performed at [facility]  Vitamin B12     Status: Abnormal   Collection Time: 08/04/14  4:20 AM  Result Value Ref Range   Vitamin B-12 1314 (H) 211 - 911 pg/mL    Comment: Performed at [facility]  TSH     Status: None   Collection Time: 08/04/14  4:20 AM  Result Value Ref Range   TSH 1.149 0.350 -  4.500 uIU/mL  Procalcitonin     Status: None   Collection Time: 08/04/14  4:20 AM  Result Value Ref Range   Procalcitonin <0.10 ng/mL    Comment:        Interpretation: PCT (Procalcitonin) <= 0.5 ng/mL: Systemic infection (sepsis) is not likely. Local bacterial infection is possible. (NOTE)         ICU PCT Algorithm               Non ICU PCT Algorithm    ----------------------------     ------------------------------         PCT < 0.25 ng/mL                 PCT < 0.1 ng/mL     Stopping of antibiotics            Stopping of antibiotics       strongly encouraged.               strongly encouraged.    ----------------------------     ------------------------------       PCT level decrease by               PCT < 0.25 ng/mL       >= 80% from peak PCT       OR PCT 0.25 - 0.5 ng/mL          Stopping of antibiotics                                             encouraged.     Stopping of antibiotics           encouraged.    ----------------------------     ------------------------------       PCT level decrease by              PCT >= 0.25 ng/mL       < 80% from peak PCT        AND PCT >= 0.5 ng/mL            Continuin g antibiotics                                              encouraged.       Continuing antibiotics            encouraged.    ----------------------------      ------------------------------     PCT level increase compared          PCT > 0.5 ng/mL         with peak PCT AND          PCT >= 0.5 ng/mL             Escalation of antibiotics                                          strongly encouraged.      Escalation of antibiotics        strongly encouraged.     Imaging: Ct Chest High Resolution  08/03/2014   CLINICAL DATA:  Interstitial lung disease, decreased O2 sats, prednisone  taper, subsequent encounter.  EXAM: CT CHEST WITHOUT CONTRAST  TECHNIQUE: Multidetector CT imaging of the chest was performed following the standard protocol without intravenous contrast. High resolution imaging of the lungs, as well as inspiratory and expiratory imaging, was performed.  COMPARISON:  03/01/2014 and 11/09/2006.  FINDINGS: Mediastinum/Nodes: Mediastinal lymph nodes measure up to 1.3 cm in short axis in AP window (previously 1.0 cm). Hilar regions are difficult to definitively evaluate without IV contrast. No axillary adenopathy. Atherosclerotic calcification of the arterial vasculature, including coronary arteries. Pulmonary arteries and heart are enlarged. No pericardial effusion.  Lungs/Pleura: There are patchy areas of ground-glass with interstitial thickening, coarsening, mild architectural distortion and subpleural reticulation, without a definite zonal predominance. Dependent atelectasis and/or scarring in the right hemi thorax, similar to 03/01/2014. Probable tiny left pleural effusion.  Upper abdomen: Visualized portions of the liver, gallbladder and right adrenal gland are unremarkable. A 1.9 cm nodule in the left adrenal gland is only partially imaged. Visualized portions of the spleen and stomach are grossly unremarkable. No upper abdominal adenopathy.  Musculoskeletal: No worrisome lytic or sclerotic lesions. Degenerative changes are seen in the spine.  IMPRESSION: 1. Pulmonary parenchymal pattern of patchy ground-glass, mild interstitial thickening, coarsening,  mild architectural distortion and subpleural reticulation, without a definite zonal predominance, suspicious for pulmonary edema or atypical/viral pneumonia superimposed on interstitial lung disease. Evaluation of underlying interstitial lung disease, and assessment of progression, are difficult given the suspected superimposed acute findings. It can be said, however, that the findings are not consistent with usual interstitial pneumonitis (UIP) and may be due to underlying postinflammatory fibrosis. 2. Tiny left pleural effusion. 3. Enlarged pulmonary arteries, indicative of pulmonary arterial hypertension. 4. Slight enlargement of mediastinal lymph nodes, likely reactive. 5. Coronary artery calcification. 6. Left adrenal nodule, incompletely imaged.   Electronically Signed   By: Lorin Picket M.D.   On: 08/03/2014 08:36    Assessment:  1. Principal Problem: 2.   SOB (shortness of breath) 3. Active Problems: 4.   OBESITY-MORBID (>100') 5.   Essential hypertension 6.   EMBOLISM & INFARCTION, IATROGENIC PULMONARY 7.   Atrial fibrillation 8.   DIASTOLIC HEART FAILURE, CHRONIC 9.   Abdominal aortic aneurysm 10.   Allergic rhinitis 11.   Acute on chronic diastolic heart failure 12.   CHF exacerbation 13.   Hx pulmonary embolism 14.   History of DVT (deep vein thrombosis) 15.   CAP (community acquired pneumonia) 84.   Interstitial lung disease 17.   Acute on chronic respiratory failure 18.   Plan:  1. Maintaining sinus rhythm. Diuresing well. Continue diuresis and steroids per PCCM. Echo looks to show preserved LV function. Now off of amiodarone. Warfarin per pharmacy.  Time Spent Directly with Patient:  15 minutes  Length of Stay:  LOS: 3 days   Pixie Casino, MD, [facility] Attending Cardiologist [facility]  Alanea Woolridge C 08/04/2014, 11:04 AM

## 2014-08-04 NOTE — Progress Notes (Addendum)
[facility] PROGRESS NOTE  [name] [name] HQI:[id number] DOB: Jan 07, 1932 DOA: 08/01/2014 PCP: Kerby Nora, MD  Brief Summary  The patient is an 80-yo female with history of HTN, GERD, gout, a-fib, hx of DVT and PE on coumadin, small AAA, anemia, CKD II/III, diastolic heart failure, interstitial lung disease on 3.5L home oxygen and recently started on steroids without benefit, who presented with shortness of breath.  She was started on 40 mg of prednisone daily in January 2016 with some benefit, however, she developed weight gain and water retention. Her steroids were then tapered, however, her shortness of breath got progressively worse. She increased her oxygen to 5 L but continued to have dyspnea. Just prior to admission, she developed a fever to 100.6 Fahrenheit and a cough productive of mucus. She presented to the emergency department where she was found to have mild AK I with a troponin of 0.1, BNP 658, white blood cell count 14.2. Her chest x-ray demonstrated congestive heart failure changes with patchy left lung infiltrate.  She required bipap and has been reliant on venti mask for several days.  Pulmonology was consulted.    Assessment/Plan  Acute on Chronic Respiratory Failure, multifactorial due to ILD, Pulmonary HTN, Diastolic HF, and possible CAP  -  BIPAP prn -  Wean venti mask as tolerated, down to 40% yesterday  -  Continue tx for diastolic heart failure -  Continue ceftriaxone and azithromycin  -  Continue nebulizer treatments -  Steroids per pulm:  Increased to IV  -  Appreciate PCCM assistance  -  HRCT chest:  Consistent with pulmonary edema or atypical/viral pneumonia and not particularly consistent with UIP.  Left pleural effusion, enlarged pulmonary arteries, slightly enlarged mediastinal lymph nodes -  Resume coumadin as were only planning possible BAL and not biopsy  Acute on chronic diastolic congestive heart failure: 2-D echo on 02/23/14 showed EF 60-65% with a  grade 1 diastolic dysfunction. Patient is on torsemide 40 mg twice a day at home.  BNP is 658, body weight increased to 20 pounds, congestion on CXR and CT -  Increase to IV lasix 80 mg TID -  Daily weights:  Relatively unchanged to increased -  Strict I/O:  -1.7 L  CAP; chest x ray with patchy consolidation Left Lung base.  - Continue with IV ceftriaxone and azithro, day 4 - Influenza neg - Respiratory panel negative.  - Strep Pneumonia negative.  - Legionella negative.   Acute on CKD stage II, creatinine trending down with diuresis -  Monitor on IV lasix.  -  Hold on getting renal US right now,.  Elevated trop: 0.10 and approximately stable. It is most likely due to congestive heart failure exacerbation and stress. Patient does not have chest pain. EKG has no significant change compared with previous EKG. -trop x 3:  Trended down slightly to 0.06 as creatinine improved -continue aspirin, Lipitor -ECHO pending.   HTN, blood pressures elevated -  Continue diuresis and tapering steroids -  Increase dilt -  Avoid BB given wheeze/respiratory distress  Paroxysmal atrial Fibrillation: CHA2DS2-VASc Score is 5, need oral anticoagulation. Patient is on coumadin at home. INR is 1.53 on admission, subtherapeutic.  Needed multiple cardioversions previously and was only finally maintained on amiodarone - resume coumadin - amiodarone discontinued  - appreciate cardiology assistance - Increase Cardizem - Tele:  NSR, rate 60s  Remote history of DVT and pulmonary embolism: 6 years ago which was treated. No signs of new DVT -  Bridge  to coumadin  Right shin oozing lesion: - appreciate wound care assistance  Normocytic anemia -  Iron studies c/w iron deficiency -  b12 wnl, folate pending, TSH 1.149  Hypokalemia due to diuresis, resolved with oral potassium repletion  Leukocytosis likely secondary to steroids, rising -  Repeat CBC in a few days  Diet:  Healthy heart Access:   PIV IVF:  off Proph:  Heparin gtt  Code Status: Full Family Communication: patient alone Disposition Plan: pending further improvement in breathing   Consultants:  Pulm  Cardiology  Procedures:  CXR  HRCT chest  Antibiotics:  Ceftriaxone 3/21 >>  Azithromycin 3/21 >>  HPI/Subjective:  Feeling less Wylma Tatem of breath.  Was able to give one sputum sample. Denies diarrhea.  Objective: Filed Vitals:   08/04/14 0200 08/04/14 0300 08/04/14 0415 08/04/14 0500  BP: 146/64 130/71 152/76 149/60  Pulse: 62 55 60 62  Temp:      TempSrc:      Resp: Height:      Weight:      SpO2: 98% 98% 97% 95%    Intake/Output Summary (Last 24 hours) at 08/04/14 0744 Last data filed at 08/04/14 0600  Gross per 24 hour  Intake 1156.95 ml  Output   2900 ml  Net -1743.05 ml   Filed Weights   08/01/14 2130 08/01/14 2223 08/02/14 0447  Weight: 128.822 kg (284 lb) 132.8 kg (292 lb 12.3 oz) 132.6 kg (292 lb 5.3 oz)    Exam:   General:  Adult female, on bipap  HEENT:  NCAT, MMM  Cardiovascular:  RRR, nl S1, S2 no mrg, 2+ pulses, warm extremities  Respiratory:  Wheezy cough, no obvious rales or rhonchi, comfortable on bipap  Abdomen:   NABS, soft, NT/ND  MSK:   Normal tone and bulk, 1+ bilateral pitting LEE  Neuro:  Grossly moves all extremities  Skin: Skin graft right anterior shin  Data Reviewed: Basic Metabolic Panel:  Recent Labs Lab 08/01/14 1850 08/02/14 0320 08/03/14 0253 08/04/14 0420  NA 142 146* 144 143  K 3.8 3.4* 3.4* 4.5  CL 100 100 100 100  CO2 29 34* 33* 29  GLUCOSE 107* 102* 81 141*  BUN 27* 25* 19 22  CREATININE 1.47* 1.32* 1.27* 1.26*  CALCIUM 9.2 9.1 8.7 9.5  MG  --  2.2  --   --    Liver Function Tests: No results for input(s): AST, ALT, ALKPHOS, BILITOT, PROT, ALBUMIN in the last 168 hours. No results for input(s): LIPASE, AMYLASE in the last 168 hours. No results for input(s): AMMONIA in the last 168 hours. CBC:  Recent  Labs Lab 08/01/14 1850 08/02/14 0320 08/03/14 0253 08/04/14 0420  WBC 14.2* 12.5* 13.8* 15.2*  HGB 12.4 11.5* 10.9* 12.1  HCT 40.9 37.8 35.8* 39.8  MCV 91.3 91.1 91.6 91.5  PLT 248 249 263 292   Cardiac Enzymes:  Recent Labs Lab 08/01/14 2309 08/02/14 0320 08/02/14 1030  TROPONINI 0.11* 0.12* 0.06*   BNP (last 3 results)  Recent Labs  08/01/14 1850  BNP 658.4*    ProBNP (last 3 results)  Recent Labs  02/22/14 0947 02/24/14 0332 03/07/14 0648  PROBNP 2319.0* 2410.0* 437.5    CBG: No results for input(s): GLUCAP in the last 168 hours.  Recent Results (from the past 240 hour(s))  Culture, blood (routine x 2) Call MD if unable to obtain prior to antibiotics being given     Status: None (Preliminary result)  Collection Time: 08/01/14 11:09 PM  Result Value Ref Range Status   Specimen Description BLOOD LEFT WRIST  Final   Special Requests BOTTLES DRAWN AEROBIC AND ANAEROBIC 8CC  Final   Culture   Final           BLOOD CULTURE RECEIVED NO GROWTH TO DATE CULTURE WILL BE HELD FOR 5 DAYS BEFORE ISSUING A FINAL NEGATIVE REPORT Performed at [facility]    Report Status PENDING  Incomplete  Culture, blood (routine x 2) Call MD if unable to obtain prior to antibiotics being given     Status: None (Preliminary result)   Collection Time: 08/01/14 11:17 PM  Result Value Ref Range Status   Specimen Description BLOOD LEFT HAND  Final   Special Requests BOTTLES DRAWN AEROBIC AND ANAEROBIC 5CC  Final   Culture   Final           BLOOD CULTURE RECEIVED NO GROWTH TO DATE CULTURE WILL BE HELD FOR 5 DAYS BEFORE ISSUING A FINAL NEGATIVE REPORT Performed at [facility]    Report Status PENDING  Incomplete  Respiratory virus panel     Status: None   Collection Time: 08/02/14  2:17 AM  Result Value Ref Range Status   Source - RVPAN NASAL SWAB  Corrected   Respiratory Syncytial Virus A Negative Negative Final   Respiratory Syncytial Virus B Negative Negative  Final   Influenza A Negative Negative Final   Influenza B Negative Negative Final   Parainfluenza 1 Negative Negative Final   Parainfluenza 2 Negative Negative Final   Parainfluenza 3 Negative Negative Final   Metapneumovirus Negative Negative Final   Rhinovirus Negative Negative Final   Adenovirus Negative Negative Final    Comment: (NOTE) Performed At: [facility] [address] [place], [place] [id number] Mila HomerHancock William F MD OZ:[id number]:[phone]   MRSA PCR Screening     Status: Abnormal   Collection Time: 08/02/14 10:59 AM  Result Value Ref Range Status   MRSA by PCR POSITIVE (A) NEGATIVE Final    Comment:        The GeneXpert MRSA Assay (FDA approved for NASAL specimens only), is one component of a comprehensive MRSA colonization surveillance program. It is not intended to diagnose MRSA infection nor to guide or monitor treatment for MRSA infections. RESULT CALLED TO, READ BACK BY AND VERIFIED WITH: SHELTON RN 13:20 08/02/14 ([email or web address])   Culture, sputum-assessment     Status: None   Collection Time: 08/03/14  8:57 AM  Result Value Ref Range Status   Specimen Description SPUTUM  Final   Special Requests NONE  Final   Sputum evaluation   Final    MICROSCOPIC FINDINGS SUGGEST THAT THIS SPECIMEN IS NOT REPRESENTATIVE OF LOWER RESPIRATORY SECRETIONS. PLEASE RECOLLECT. Gram Stain Report Called to,Read Back By and Verified With: Sherlean FootJ. Cleaver RN 11:25 08/03/14 ([email or web address])    Report Status 08/03/2014 FINAL  Final     Studies: Ct Chest High Resolution  08/03/2014   CLINICAL DATA:  Interstitial lung disease, decreased O2 sats, prednisone taper, subsequent encounter.  EXAM: CT CHEST WITHOUT CONTRAST  TECHNIQUE: Multidetector CT imaging of the chest was performed following the standard protocol without intravenous contrast. High resolution imaging of the lungs, as well as inspiratory and expiratory imaging, was performed.  COMPARISON:  03/01/2014 and 11/09/2006.  FINDINGS:  Mediastinum/Nodes: Mediastinal lymph nodes measure up to 1.3 cm in Janeene Sand axis in AP window (previously 1.0 cm). Hilar regions are difficult to definitively evaluate without IV contrast. No  axillary adenopathy. Atherosclerotic calcification of the arterial vasculature, including coronary arteries. Pulmonary arteries and heart are enlarged. No pericardial effusion.  Lungs/Pleura: There are patchy areas of ground-glass with interstitial thickening, coarsening, mild architectural distortion and subpleural reticulation, without a definite zonal predominance. Dependent atelectasis and/or scarring in the right hemi thorax, similar to 03/01/2014. Probable tiny left pleural effusion.  Upper abdomen: Visualized portions of the liver, gallbladder and right adrenal gland are unremarkable. A 1.9 cm nodule in the left adrenal gland is only partially imaged. Visualized portions of the spleen and stomach are grossly unremarkable. No upper abdominal adenopathy.  Musculoskeletal: No worrisome lytic or sclerotic lesions. Degenerative changes are seen in the spine.  IMPRESSION: 1. Pulmonary parenchymal pattern of patchy ground-glass, mild interstitial thickening, coarsening, mild architectural distortion and subpleural reticulation, without a definite zonal predominance, suspicious for pulmonary edema or atypical/viral pneumonia superimposed on interstitial lung disease. Evaluation of underlying interstitial lung disease, and assessment of progression, are difficult given the suspected superimposed acute findings. It can be said, however, that the findings are not consistent with usual interstitial pneumonitis (UIP) and may be due to underlying postinflammatory fibrosis. 2. Tiny left pleural effusion. 3. Enlarged pulmonary arteries, indicative of pulmonary arterial hypertension. 4. Slight enlargement of mediastinal lymph nodes, likely reactive. 5. Coronary artery calcification. 6. Left adrenal nodule, incompletely imaged.    Electronically Signed   By: Leanna Battles M.D.   On: 08/03/2014 08:36    Scheduled Meds: . allopurinol  300 mg Oral Daily  . antiseptic oral rinse  7 mL Mouth Rinse BID  . aspirin EC  81 mg Oral Daily  . atorvastatin  10 mg Oral Daily  . azithromycin  500 mg Intravenous Q24H  . budesonide  0.5 mg Nebulization BID  . cefTRIAXone (ROCEPHIN)  IV  1 g Intravenous Q24H  . diltiazem  120 mg Oral Daily  . escitalopram  20 mg Oral Daily  . famotidine  20 mg Oral Daily  . furosemide  80 mg Intravenous BID  . gabapentin  300 mg Oral Daily  . levalbuterol  0.63 mg Nebulization QID  . loratadine  10 mg Oral Daily  . methylPREDNISolone (SOLU-MEDROL) injection  60 mg Intravenous Q12H   Continuous Infusions: . heparin 1,650 Units/hr (08/03/14 2218)    Principal Problem:   SOB (shortness of breath) Active Problems:   OBESITY-MORBID (>100')   Essential hypertension   EMBOLISM & INFARCTION, IATROGENIC PULMONARY   Atrial fibrillation   DIASTOLIC HEART FAILURE, CHRONIC   Abdominal aortic aneurysm   Allergic rhinitis   Acute on chronic diastolic heart failure   CHF exacerbation   Hx pulmonary embolism   History of DVT (deep vein thrombosis)   CAP (community acquired pneumonia)   Interstitial lung disease   Acute on chronic respiratory failure    Time spent: 30 min    Yarden Manuelito  [facility] Pager [phone]. If 7PM-7AM, please contact night-coverage at [email or web address], password [email or web address] 08/04/2014, 7:44 AM  LOS: 3 days

## 2014-08-04 NOTE — Progress Notes (Signed)
  Echocardiogram 2D Echocardiogram has been performed.  Arvil ChacoFoster, Anh Mangano 08/04/2014, 2:05 PM

## 2014-08-04 NOTE — Progress Notes (Signed)
Name: [name] [name] MRN: [id number] DOB: 1932-01-24    ADMISSION DATE:  08/01/2014 CONSULTATION DATE:  3/22  REFERRING MD :  Sunnie Nielsen   CHIEF COMPLAINT:  Acute on chronic respiratory failure   BRIEF PATIENT DESCRIPTION:   This is a 81 year old female w/ chronic respiratory failure in the setting of ILD (w/ radiographic findings most c/w NSIP) ; ANA pos, SSA positive with hx of dry mouth. She was never biopsied due to concern that she would not tolerate procedure. Her respiratory status is further complicated by diastolic heart failure and secondary PAH. Based on initial evaluation back in Jan 2016 she was placed on a prednisone trial (starting at 40 mg daily for 2 weeks). She initially felt some improvement with this therapy.  On her best day she felt that she could ambulate close to 100 ft on 3.5 liters while on 40 of pred. Prior to that 10 feet would have been a challenge. However these benefits were short lived and outweighed by the side effects which included: weight gain (adipose tissue> water w/ 20 lb gain), fatigue, weeping form her legs and worsening dyspnea -->not ever as bad as before the pred. Based on this it was decided on her March 7 visit to attempt to wean the steroids to off. On 3/18 she had what sounded like a viral illness w/ 3 episodes of projectile vomiting; and low grade fever. She notes that following this her symptom burden really started to decline. On On 3/21 our office was called by the home health nurse stating her resting pulse ox on 3 liters was 69%, this increased to 82% on 5 liters. She notes that her dyspnea really began to take a turn for the worse after her decrease from 20 mg.  We recommended that she be seen. She was admitted on 3/21 w/ multifactorial respiratory failure. She was  Placed on IV lasix, empiric antibiotics and IV solu-cortef. We were asked to see and assist w/ management of this very complicated case.    SIGNIFICANT EVENTS  1/5: initial consult w/  [facility]. Started on Pred trial 40 mg for 2 weeks., then 30 mg x 1 month.  2/8 HHN calling our office. Reporting weight gain from 1/20 starting 267-->274; diuretic increased by her cardiologist.  2/24 seen in f/u by cards. Wt increased to 280 lbs. Blood work suggesting some volume depletion. Wt gain attributed to increased adipose tissue 3/7 [facility] visit. Wt now 285. Decided to wean off pred as side effects now worsening and dyspnea worsening.  3/18 low gd fever, projectile vomiting. Following this much worsening SOB 3/21: calling our office reporting worsening SOB 08/01/2014 - admit 08/02/14: PCCM cnsult 08/03/14: still on 15L [place].    SUBJECTIVE/OVERNIGHT/INTERVAL HX 08/04/14: Improved hypxoemia. Sitting on chair. 6L [place] 88% This after dc amio + change to IV steroids + continued lasix yesterday. Feels better. RVP panel - negative. ESR 44. Urine strep  - negative  VITAL SIGNS: Temp:  [95.8 F (35.4 C)-98 F (36.7 C)] 95.8 F (35.4 C) (03/24 0800) Pulse Rate:  [55-76] 61 (03/24 0800) Resp:  [17-28] 28 (03/24 1015) BP: (130-173)/(59-86) 160/76 mmHg (03/24 0800) SpO2:  [90 %-98 %] 94 % (03/24 1015) FiO2 (%):  [40 %-50 %] 40 % (03/24 0945) Filed Weights   08/01/14 2130 08/01/14 2223 08/02/14 0447  Weight: 128.822 kg (284 lb) 132.8 kg (292 lb 12.3 oz) 132.6 kg (292 lb 5.3 oz)    Intake/Output Summary (Last 24 hours) at 08/04/14 1143  Last data filed at 08/04/14 1100  Gross per 24 hour  Intake   1296 ml  Output   3250 ml  Net  -1954 ml   PHYSICAL EXAMINATION: General:  Chronically ill appearing 81 year old female in no distress. LOOKS BETTER Neuro:  Awake, alert, no focal def  HEENT:  Neck large. No clear JVD, MMM, + wheeze  Cardiovascular:  rrr Lungs:  Crackles in bases  Abdomen:  Obese + bowel sounds  Musculoskeletal:  Intact  Skin:  + LE edema w/ chronic venous stasis changes.   PULMONARY  Recent Labs Lab 08/03/14 0502  PHART 7.388  PCO2ART 57.3*  PO2ART 82.6  HCO3 33.7*    TCO2 35.5  O2SAT 94.8    CBC  Recent Labs Lab 08/02/14 0320 08/03/14 0253 08/04/14 0420  HGB 11.5* 10.9* 12.1  HCT 37.8 35.8* 39.8  WBC 12.5* 13.8* 15.2*  PLT 249 263 292    COAGULATION  Recent Labs Lab 08/01/14 1850 08/02/14 0320 08/03/14 0253 08/04/14 0420  INR 1.53* 1.64* 1.81* 1.96*    CARDIAC    Recent Labs Lab 08/01/14 2309 08/02/14 0320 08/02/14 1030  TROPONINI 0.11* 0.12* 0.06*   No results for input(s): PROBNP in the last 168 hours.   CHEMISTRY  Recent Labs Lab 08/01/14 1850 08/02/14 0320 08/03/14 0253 08/04/14 0420  NA 142 146* 144 143  K 3.8 3.4* 3.4* 4.5  CL 100 100 100 100  CO2 29 34* 33* 29  GLUCOSE 107* 102* 81 141*  BUN 27* 25* 19 22  CREATININE 1.47* 1.32* 1.27* 1.26*  CALCIUM 9.2 9.1 8.7 9.5  MG  --  2.2  --   --    Estimated Creatinine Clearance: 48.3 mL/min (by C-G formula based on Cr of 1.26).   LIVER  Recent Labs Lab 08/01/14 1850 08/02/14 0320 08/03/14 0253 08/04/14 0420  INR 1.53* 1.64* 1.81* 1.96*     INFECTIOUS  Recent Labs Lab 08/01/14 2309 08/03/14 1203 08/04/14 0420  LATICACIDVEN 1.8  --   --   PROCALCITON  --  <0.10 <0.10     ENDOCRINE CBG (last 3)  No results for input(s): GLUCAP in the last 72 hours.   MICRO Results for orders placed or performed during the hospital encounter of 08/01/14  Culture, blood (routine x 2) Call MD if unable to obtain prior to antibiotics being given     Status: None (Preliminary result)   Collection Time: 08/01/14 11:09 PM  Result Value Ref Range Status   Specimen Description BLOOD LEFT WRIST  Final   Special Requests BOTTLES DRAWN AEROBIC AND ANAEROBIC 8CC  Final   Culture   Final           BLOOD CULTURE RECEIVED NO GROWTH TO DATE CULTURE WILL BE HELD FOR 5 DAYS BEFORE ISSUING A FINAL NEGATIVE REPORT Performed at [facility]    Report Status PENDING  Incomplete  Culture, blood (routine x 2) Call MD if unable to obtain prior to antibiotics being  given     Status: None (Preliminary result)   Collection Time: 08/01/14 11:17 PM  Result Value Ref Range Status   Specimen Description BLOOD LEFT HAND  Final   Special Requests BOTTLES DRAWN AEROBIC AND ANAEROBIC 5CC  Final   Culture   Final           BLOOD CULTURE RECEIVED NO GROWTH TO DATE CULTURE WILL BE HELD FOR 5 DAYS BEFORE ISSUING A FINAL NEGATIVE REPORT Performed at [facility]  Report Status PENDING  Incomplete  Respiratory virus panel     Status: None   Collection Time: 08/02/14  2:17 AM  Result Value Ref Range Status   Source - RVPAN NASAL SWAB  Corrected   Respiratory Syncytial Virus A Negative Negative Final   Respiratory Syncytial Virus B Negative Negative Final   Influenza A Negative Negative Final   Influenza B Negative Negative Final   Parainfluenza 1 Negative Negative Final   Parainfluenza 2 Negative Negative Final   Parainfluenza 3 Negative Negative Final   Metapneumovirus Negative Negative Final   Rhinovirus Negative Negative Final   Adenovirus Negative Negative Final    Comment: (NOTE) Performed At: [facility] [place], [place] [id number] Lindon Romp MD [id number]   MRSA PCR Screening     Status: Abnormal   Collection Time: 08/02/14 10:59 AM  Result Value Ref Range Status   MRSA by PCR POSITIVE (A) NEGATIVE Final    Comment:        The GeneXpert MRSA Assay (FDA approved for NASAL specimens only), is one component of a comprehensive MRSA colonization surveillance program. It is not intended to diagnose MRSA infection nor to guide or monitor treatment for MRSA infections. RESULT CALLED TO, READ BACK BY AND VERIFIED WITH: SHELTON RN 13:20 08/02/14 ([email or web address])   Culture, sputum-assessment     Status: None   Collection Time: 08/03/14  8:57 AM  Result Value Ref Range Status   Specimen Description SPUTUM  Final   Special Requests NONE  Final   Sputum evaluation   Final    MICROSCOPIC FINDINGS SUGGEST THAT THIS  SPECIMEN IS NOT REPRESENTATIVE OF LOWER RESPIRATORY SECRETIONS. PLEASE RECOLLECT. Gram Stain Report Called to,Read Back By and Verified With: Thomasene Mohair RN 11:25 08/03/14 ([email or web address])    Report Status 08/03/2014 FINAL  Final      Anti-infectives    Start     Dose/Rate Route Frequency Ordered Stop   08/02/14 2200  cefTRIAXone (ROCEPHIN) 1 g in dextrose 5 % 50 mL IVPB - Premix     1 g 100 mL/hr over 30 Minutes Intravenous Every 24 hours 08/01/14 2219     08/02/14 2200  azithromycin (ZITHROMAX) 500 mg in dextrose 5 % 250 mL IVPB     500 mg 250 mL/hr over 60 Minutes Intravenous Every 24 hours 08/01/14 2219     08/02/14 1000  oseltamivir (TAMIFLU) capsule 75 mg  Status:  Discontinued     75 mg Oral 2 times daily 08/02/14 0800 08/02/14 0846   08/02/14 1000  oseltamivir (TAMIFLU) capsule 30 mg  Status:  Discontinued     30 mg Oral 2 times daily 08/02/14 0846 08/03/14 0831   08/01/14 2030  cefTRIAXone (ROCEPHIN) 1 g in dextrose 5 % 50 mL IVPB     1 g 100 mL/hr over 30 Minutes Intravenous  Once 08/01/14 2022 08/01/14 2119   08/01/14 2030  azithromycin (ZITHROMAX) 500 mg in dextrose 5 % 250 mL IVPB     500 mg 250 mL/hr over 60 Minutes Intravenous  Once 08/01/14 2022 08/01/14 2207        IMAGING x48h Ct Chest High Resolution  08/03/2014   CLINICAL DATA:  Interstitial lung disease, decreased O2 sats, prednisone taper, subsequent encounter.  EXAM: CT CHEST WITHOUT CONTRAST  TECHNIQUE: Multidetector CT imaging of the chest was performed following the standard protocol without intravenous contrast. High resolution imaging of the lungs, as well as inspiratory and expiratory imaging, was performed.  COMPARISON:  03/01/2014 and 11/09/2006.  FINDINGS: Mediastinum/Nodes: Mediastinal lymph nodes measure up to 1.3 cm in short axis in AP window (previously 1.0 cm). Hilar regions are difficult to definitively evaluate without IV contrast. No axillary adenopathy. Atherosclerotic calcification of the arterial  vasculature, including coronary arteries. Pulmonary arteries and heart are enlarged. No pericardial effusion.  Lungs/Pleura: There are patchy areas of ground-glass with interstitial thickening, coarsening, mild architectural distortion and subpleural reticulation, without a definite zonal predominance. Dependent atelectasis and/or scarring in the right hemi thorax, similar to 03/01/2014. Probable tiny left pleural effusion.  Upper abdomen: Visualized portions of the liver, gallbladder and right adrenal gland are unremarkable. A 1.9 cm nodule in the left adrenal gland is only partially imaged. Visualized portions of the spleen and stomach are grossly unremarkable. No upper abdominal adenopathy.  Musculoskeletal: No worrisome lytic or sclerotic lesions. Degenerative changes are seen in the spine.  IMPRESSION: 1. Pulmonary parenchymal pattern of patchy ground-glass, mild interstitial thickening, coarsening, mild architectural distortion and subpleural reticulation, without a definite zonal predominance, suspicious for pulmonary edema or atypical/viral pneumonia superimposed on interstitial lung disease. Evaluation of underlying interstitial lung disease, and assessment of progression, are difficult given the suspected superimposed acute findings. It can be said, however, that the findings are not consistent with usual interstitial pneumonitis (UIP) and may be due to underlying postinflammatory fibrosis. 2. Tiny left pleural effusion. 3. Enlarged pulmonary arteries, indicative of pulmonary arterial hypertension. 4. Slight enlargement of mediastinal lymph nodes, likely reactive. 5. Coronary artery calcification. 6. Left adrenal nodule, incompletely imaged.   Electronically Signed   By: Lorin Picket M.D.   On: 08/03/2014 08:36        ASSESSMENT / PLAN:  Acute on Chronic Hypoxic Respiratory failure ILD (presumed NSIP by CT scan) OSA on CPAP Secondary PAH Decompensated diastolic HF Decompensated Cor  pulmonale  Probable OHS w/ substantial weight gain and worsening deconditioning  Af (currently NSR but on amiodarone) Recent viral illness  Obesity htn  Prior PE/DVT   Multi-factorial Acute on Chronic respiratory failure on the basis of what is likely some combination of decompensated diastolic HF,cor pulmonale, obesity hypoventilation, and deconditioning super-imposed on underlying ILD (presumed NSIP v Autoimmune ILD by CT scan) v Amio Tox   Her ILD seemed initially somewhat steroid responsive however she had substantial wt gain resulted in return of dyspnea,  and worsening edema ultimately resulting in the decision to decrease her pred. She has since had an episode of what seemed like a viral illness. Unclear to what extent this is playing on her current symptom burden but she feels that her dyspnea has dramatically worsened since then. Probably no one intervention here to fix things. Suspect we can make some impact on her dyspnea but ultimately will really need to focus on what her O2 demand at home will be as well    - significant improvement 08/04/14. DOwn to [place] (from 15L) after IV steroids. Viral panel negative  Recs Continue Push diuresis Titrate O2-- Given wheeznig and amio intake continue  Steroids  IV; taper in few days - higher doses, longer period of few months might be needed DC amio since 08/03/14 DC  current abx - PCT normal, urine strep normal Cont anticoagulation  Hold off on bronch BAL   PCCM will see again once perhaps over long weekend    Dr. Brand Males, M.D., [facility].C.P Pulmonary and Critical Care Medicine Staff Physician [place] [facility] Pager: [phone], If no answer or between  15:00h - 7:00h: call [phone]  08/04/2014 11:43 AM

## 2014-08-04 NOTE — Progress Notes (Signed)
ANTICOAGULATION CONSULT NOTE  Pharmacy Consult for Warfarin and Heparin Indication: atrial fibrillation  No Known Allergies  Patient Measurements: Height: 5\' 4"  (162.6 cm) Weight: 292 lb 5.3 oz (132.6 kg) IBW/kg (Calculated) : 54.7 Heparin Dosing Weight: 88 kg  Vital Signs: Temp: 97.9 F (36.6 C) (03/23 2326) Temp Source: Oral (03/23 2326) BP: 149/60 mmHg (03/24 0500) Pulse Rate: 62 (03/24 0500)  Labs:  Recent Labs  08/01/14 2309 08/02/14 0320 08/02/14 1030 08/02/14 1310 08/03/14 0253 08/04/14 0420  HGB  --  11.5*  --   --  10.9* 12.1  HCT  --  37.8  --   --  35.8* 39.8  PLT  --  249  --   --  263 292  LABPROT  --  19.6*  --   --  21.1* 22.5*  INR  --  1.64*  --   --  1.81* 1.96*  HEPARINUNFRC  --   --   --  0.30 0.28* 0.58  CREATININE  --  1.32*  --   --  1.27* 1.26*  TROPONINI 0.11* 0.12* 0.06*  --   --   --     Estimated Creatinine Clearance: 48.3 mL/min (by C-G formula based on Cr of 1.26).  Medical History: Past Medical History  Diagnosis Date  . Congestive heart failure, unspecified     a. ECHO 6/0: EF 55-60%, mild LVH, mildly dilated RV w. mildly dec fx, RVSP 67  . Chronic atrial fibrillation     failed cardioversion in past  -repeat DC-CV on October 5th, 2010  . Personal history of DVT (deep vein thrombosis) 2008  . Obstructive sleep apnea   . HTN (hypertension)   . Morbid obesity   . Shingles     with postherpetic neuraigia  . GERD (gastroesophageal reflux disease)   . Hyperlipidemia   . Meningitis 1950s  . Gallstones     s/p cholecystectomy  . CAD (coronary artery disease)     nonobstructive by cath 8/10 - LAD 40-50% w mild PAH mean 29 w PVR 3.2 [place]  . AAA (abdominal aortic aneurysm)     small  . PE (pulmonary embolism)     history of  . Anemia   . Shortness of breath dyspnea   . Chronic kidney disease     ckd stage 2  . Lung interstitial disease   . Hypoxemia    Infusions:  . heparin 1,650 Units/hr (08/03/14 2218)     Assessment: 81yo female with history of Afib presents with worsening SOB. Pharmacy is consulted to dose warfarin and heparin bridge for atrial fibrillation. INR on presentation is SUBtherapeutic at 1.5. CBC is wnl, sCr 1.5, BNP 658, Trop 0.1.  HL just below goal this morning at 0.5 on 1650 units/hr. INR trending up to 1.9. No bleeding issues noted overnight. CBC stable. Warfarin was held last night.  PTA warfarin dose: 3.75mg  Tues, Thurs, Sun and 2.5mg  AODs.  Goal of Therapy:  INR 2-3 Heparin level 0.3-0.7 units/ml Monitor platelets by anticoagulation protocol: Yes   Plan:  Continue heparin infusion at 1650 units/hr Check anti-Xa level daily while on heparin Continue to monitor H&H and platelets  Warfarin 4mg  tonight x1 Daily INR/CBC Monitor s/sx of bleeding  Sheppard CoilFrank Wilson PharmD., BCPS Clinical Pharmacist Pager [phone] 08/04/2014 8:39 AM

## 2014-08-05 LAB — BASIC METABOLIC PANEL
Anion gap: 8 (ref 5–15)
BUN: 34 mg/dL — ABNORMAL HIGH (ref 6–23)
CO2: 32 mmol/L (ref 19–32)
Calcium: 9.4 mg/dL (ref 8.4–10.5)
Chloride: 100 mmol/L (ref 96–112)
Creatinine, Ser: 1.37 mg/dL — ABNORMAL HIGH (ref 0.50–1.10)
GFR, EST AFRICAN AMERICAN: 41 mL/min — AB (ref >90)
GFR, EST NON AFRICAN AMERICAN: 35 mL/min — AB (ref >90)
Glucose, Bld: 141 mg/dL — ABNORMAL HIGH (ref 70–99)
Potassium: 3.8 mmol/L (ref 3.5–5.1)
SODIUM: 140 mmol/L (ref 135–145)

## 2014-08-05 LAB — PROTIME-INR
INR: 2.25 — AB (ref 0.00–1.49)
Prothrombin Time: 25.1 seconds — ABNORMAL HIGH (ref 11.6–15.2)

## 2014-08-05 LAB — HEPARIN LEVEL (UNFRACTIONATED)
Heparin Unfractionated: 0.87 IU/mL — ABNORMAL HIGH (ref 0.30–0.70)
Heparin Unfractionated: 0.72 IU/mL — ABNORMAL HIGH (ref 0.30–0.70)

## 2014-08-05 LAB — PROCALCITONIN: Procalcitonin: <0.1 ng/mL

## 2014-08-05 LAB — TRANSFERRIN: TRANSFERRIN: 301 mg/dL (ref 200–370)

## 2014-08-05 LAB — FOLATE RBC
Folate, Hemolysate: >620 ng/mL
HEMATOCRIT: 38.2 % (ref 34.0–46.6)

## 2014-08-05 MED ORDER — WARFARIN SODIUM 3 MG PO TABS
3.0000 mg | ORAL_TABLET | Freq: Once | ORAL | Status: AC
  Administered 2014-08-05: 3 mg via ORAL
  Filled 2014-08-05: qty 1

## 2014-08-05 MED ORDER — TORSEMIDE 20 MG PO TABS
40.0000 mg | ORAL_TABLET | Freq: Two times a day (BID) | ORAL | Status: DC
  Administered 2014-08-06 – 2014-08-07 (×3): 40 mg via ORAL
  Filled 2014-08-05 (×5): qty 2

## 2014-08-05 MED ORDER — HEPARIN (PORCINE) IN NACL 100-0.45 UNIT/ML-% IJ SOLN
1450.0000 [IU]/h | INTRAMUSCULAR | Status: DC
  Administered 2014-08-05: 1450 [IU]/h via INTRAVENOUS
  Filled 2014-08-05: qty 250

## 2014-08-05 NOTE — Progress Notes (Signed)
ANTICOAGULATION CONSULT NOTE  Pharmacy Consult for Heparin Indication: atrial fibrillation  No Known Allergies  Patient Measurements: Height: 5\' 4"  (162.6 cm) Weight: 292 lb 5.3 oz (132.6 kg) IBW/kg (Calculated) : 54.7 Heparin Dosing Weight: 88 kg  Vital Signs: Temp: 97.8 F (36.6 C) (03/25 0425) Temp Source: Axillary (03/25 0425) BP: 140/55 mmHg (03/25 0400) Pulse Rate: 55 (03/25 0400)  Labs:  Recent Labs  08/02/14 1030  08/03/14 0253 08/04/14 0420 08/05/14 0500  HGB  --   --  10.9* 12.1  --   HCT  --   --  35.8* 39.8  --   PLT  --   --  263 292  --   LABPROT  --   --  21.1* 22.5*  --   INR  --   --  1.81* 1.96*  --   HEPARINUNFRC  --   < > 0.28* 0.58 0.87*  CREATININE  --   --  1.27* 1.26*  --   TROPONINI 0.06*  --   --   --   --   < > = values in this interval not displayed.  Estimated Creatinine Clearance: 48.3 mL/min (by C-G formula based on Cr of 1.26).  Medical History: Past Medical History  Diagnosis Date  . Congestive heart failure, unspecified     a. ECHO 6/0: EF 55-60%, mild LVH, mildly dilated RV w. mildly dec fx, RVSP 67  . Chronic atrial fibrillation     failed cardioversion in past  -repeat DC-CV on October 5th, 2010  . Personal history of DVT (deep vein thrombosis) 2008  . Obstructive sleep apnea   . HTN (hypertension)   . Morbid obesity   . Shingles     with postherpetic neuraigia  . GERD (gastroesophageal reflux disease)   . Hyperlipidemia   . Meningitis 1950s  . Gallstones     s/p cholecystectomy  . CAD (coronary artery disease)     nonobstructive by cath 8/10 - LAD 40-50% w mild PAH mean 29 w PVR 3.2 [place]  . AAA (abdominal aortic aneurysm)     small  . PE (pulmonary embolism)     history of  . Anemia   . Shortness of breath dyspnea   . Chronic kidney disease     ckd stage 2  . Lung interstitial disease   . Hypoxemia    Infusions:  . heparin 1,650 Units/hr (08/04/14 1606)    Assessment: 81yo female with history of Afib  presents with worsening SOB. Pharmacy consulted to dose warfarin and heparin bridge for atrial fibrillation. Heparin level this morning is 0.87 on 1650 units/hr. Level has increased above goal. RN reports that level was drawn correctly from arm opposite of arm that heparin IV infusion is in. No bleeding noted.    PTA warfarin dose: 3.75mg  Tues, Thurs, Sun and 2.5mg  AODs.  Goal of Therapy:  INR 2-3 Heparin level 0.3-0.7 units/ml Monitor platelets by anticoagulation protocol: Yes   Plan:  Decrease heparin infusion to 1450 units/hr Check heparin level in 6 hours  Daily heparin level, CBC while on heparin Continue to monitor H&H and platelets  Monitor s/sx of bleeding INR pending.  Will f/u and dose coumadin later today.    Noah Delaineuth Bandon Sherwin, RPh Clinical Pharmacist Pager: [phone] 08/05/2014 6:08 AM

## 2014-08-05 NOTE — Progress Notes (Signed)
[facility] PROGRESS NOTE  [name] [name] ZOX:[id number] DOB: [birth year] DOA: 08/01/2014 PCP: [name] Nora, MD  Brief Summary  The patient is an 81-yo female with history of HTN, GERD, gout, a-fib, hx of DVT and PE on coumadin, small AAA, anemia, CKD II/III, diastolic heart failure, interstitial lung disease on 3.5L home oxygen and recently started on steroids without benefit, who presented with shortness of breath.  She was started on 40 mg of prednisone daily in January 2016 with some benefit, however, she developed weight gain and water retention. Her steroids were then tapered, however, her shortness of breath got progressively worse. She increased her oxygen to 5 L but continued to have dyspnea. Just prior to admission, she developed a fever to 100.6 Fahrenheit and a cough productive of mucus. She presented to the emergency department where she was found to have mild AK I with a troponin of 0.1, BNP 658, white blood cell count 14.2. Her chest x-ray demonstrated congestive heart failure changes with patchy left lung infiltrate.  She required bipap and has been reliant on venti mask for several days, recently transitioned to 6L [place].  Pulmonology and cardiology consulted.    Assessment/Plan  Acute on Chronic Respiratory Failure, multifactorial due to ILD, Pulmonary HTN, Diastolic HF, and possible CAP  -  Down to 6L and continuing to attempt to wean -  Tx for diastolic heart failure as below -  Continue ceftriaxone and azithromycin day 5 of 7 -  Continue nebulizer treatments -  Steroids per pulm -  Appreciate PCCM assistance  -  HRCT chest:  Consistent with pulmonary edema or atypical/viral pneumonia and not particularly consistent with UIP.  Left pleural effusion, enlarged pulmonary arteries, slightly enlarged mediastinal lymph nodes -  Resume coumadin as were only planning possible BAL and not biopsy  Acute on chronic diastolic congestive heart failure: 2-D echo on 02/23/14 showed EF  60-65% with a grade 1 diastolic dysfunction. Patient is on torsemide 40 mg twice a day at home.  BNP is 658, body weight increased to 20 pounds, congestion on CXR and CT -  BUN and creatinine rising -  D/c IV lasix -  Resume home torsemide -  Daily weights:  decreased -  Strict I/O:  -1.4L yesterday -  Repeat CXR in AM  CAP; chest x ray with patchy consolidation Left Lung base.  - Continue with IV ceftriaxone and azithro, day 5 - Influenza neg - Respiratory panel negative.  - Strep Pneumonia negative.  - Legionella negative.   Acute on CKD stage II, creatinine increased slightly -  D/c IV lasix.  -  Hold on getting renal US right now  Elevated trop: 0.10 and approximately stable. It is most likely due to congestive heart failure exacerbation and stress. Patient does not have chest pain. EKG has no significant change compared with previous EKG. -trop x 3:  Trended down slightly to 0.06 as creatinine improved -continue aspirin, Lipitor -ECHO with preserved LV.   HTN, blood pressures decreasing -  Continue tapering steroids -  Continue increased dilt -  Avoid BB given wheeze/respiratory distress  Paroxysmal atrial Fibrillation: CHA2DS2-VASc Score is 5, need oral anticoagulation. Patient is on coumadin at home. INR is 1.53 on admission, subtherapeutic.  Needed multiple cardioversions previously and was only finally maintained on amiodarone - continue coumadin - amiodarone discontinued  - appreciate cardiology assistance - continue increased Cardizem - Tele:  NSR, rate 60s  Remote history of DVT and pulmonary embolism: 6 years ago which was  treated. No signs of new DVT -  Bridge to coumadin -  F/u INR   Right shin oozing lesion: - appreciate wound [name] assistance  Normocytic anemia -  Iron studies c/w iron deficiency -  Started iron supplementation -  b12 wnl, folate pending, TSH 1.149  Hypokalemia due to diuresis, resolved with oral potassium repletion  Leukocytosis  likely secondary to steroids, rising -  Repeat CBC tomorrow  Diet:  Healthy heart Access:  PIV IVF:  off Proph:  Heparin gtt  Code Status: Full Family Communication: patient alone Disposition Plan: pending further improvement in breathing   Consultants:  Pulm  Cardiology  Procedures:  CXR  HRCT chest  Antibiotics:  Ceftriaxone 3/21 >>  Azithromycin 3/21 >>  HPI/Subjective:  Feeling less [name] [name] of breath and glad to have face mask off.    Objective: Filed Vitals:   08/05/14 0425 08/05/14 0600 08/05/14 0808 08/05/14 0855  BP:  127/51 122/54   Pulse:  57 63   Temp: 97.8 F (36.6 C)  97.6 F (36.4 C)   TempSrc: Axillary  Oral   Resp:  29 24   Height:      Weight:  128.776 kg (283 lb 14.4 oz)    SpO2:  86% 97% 98%    Intake/Output Summary (Last 24 hours) at 08/05/14 0946 Last data filed at 08/05/14 1610  Gross per 24 hour  Intake   1410 ml  Output   2760 ml  Net  -1350 ml   Filed Weights   08/01/14 2223 08/02/14 0447 08/05/14 0600  Weight: 132.8 kg (292 lb 12.3 oz) 132.6 kg (292 lb 5.3 oz) 128.776 kg (283 lb 14.4 oz)    Exam:   General:  Adult female, on [place]  HEENT:  NCAT, mildly dry MM, skin appears dry  Cardiovascular:  RRR, nl S1, S2 no mrg, 2+ pulses, warm extremities  Respiratory:  Faint rales at bases, no obvious wheezes or rhonchi  Abdomen:   NABS, soft, NT/ND  MSK:   Normal tone and bulk, trace bilateral pitting LEE  Neuro:  Grossly moves all extremities  Skin: Skin graft right anterior shin  Data Reviewed: Basic Metabolic Panel:  Recent Labs Lab 08/01/14 1850 08/02/14 0320 08/03/14 0253 08/04/14 0420 08/05/14 0500  NA 142 146* 144 143 140  K 3.8 3.4* 3.4* 4.5 3.8  CL 100 100 100 100 100  CO2 29 34* 33* 29 32  GLUCOSE 107* 102* 81 141* 141*  BUN 27* 25* 19 22 34*  CREATININE 1.47* 1.32* 1.27* 1.26* 1.37*  CALCIUM 9.2 9.1 8.7 9.5 9.4  MG  --  2.2  --   --   --    Liver Function Tests: No results for input(s): AST,  ALT, ALKPHOS, BILITOT, PROT, ALBUMIN in the last 168 hours. No results for input(s): LIPASE, AMYLASE in the last 168 hours. No results for input(s): AMMONIA in the last 168 hours. CBC:  Recent Labs Lab 08/01/14 1850 08/02/14 0320 08/03/14 0253 08/04/14 0420  WBC 14.2* 12.5* 13.8* 15.2*  HGB 12.4 11.5* 10.9* 12.1  HCT 40.9 37.8 35.8* 39.8  MCV 91.3 91.1 91.6 91.5  PLT 248 249 263 292   Cardiac Enzymes:  Recent Labs Lab 08/01/14 2309 08/02/14 0320 08/02/14 1030  TROPONINI 0.11* 0.12* 0.06*   BNP (last 3 results)  Recent Labs  08/01/14 1850  BNP 658.4*    ProBNP (last 3 results)  Recent Labs  02/22/14 0947 02/24/14 0332 03/07/14 0648  PROBNP 2319.0* 2410.0* 437.5  CBG: No results for input(s): GLUCAP in the last 168 hours.  Recent Results (from the past 240 hour(s))  Culture, blood (routine x 2) Call MD if unable to obtain prior to antibiotics being given     Status: None (Preliminary result)   Collection Time: 08/01/14 11:09 PM  Result Value Ref Range Status   Specimen Description BLOOD LEFT WRIST  Final   Special Requests BOTTLES DRAWN AEROBIC AND ANAEROBIC 8CC  Final   Culture   Final           BLOOD CULTURE RECEIVED NO GROWTH TO DATE CULTURE WILL BE HELD FOR 5 DAYS BEFORE ISSUING A FINAL NEGATIVE REPORT Performed at [facility]    Report Status PENDING  Incomplete  Culture, blood (routine x 2) Call MD if unable to obtain prior to antibiotics being given     Status: None (Preliminary result)   Collection Time: 08/01/14 11:17 PM  Result Value Ref Range Status   Specimen Description BLOOD LEFT HAND  Final   Special Requests BOTTLES DRAWN AEROBIC AND ANAEROBIC 5CC  Final   Culture   Final           BLOOD CULTURE RECEIVED NO GROWTH TO DATE CULTURE WILL BE HELD FOR 5 DAYS BEFORE ISSUING A FINAL NEGATIVE REPORT Performed at [facility]    Report Status PENDING  Incomplete  Respiratory virus panel     Status: None   Collection Time:  08/02/14  2:17 AM  Result Value Ref Range Status   Source - RVPAN NASAL SWAB  Corrected   Respiratory Syncytial Virus A Negative Negative Final   Respiratory Syncytial Virus B Negative Negative Final   Influenza A Negative Negative Final   Influenza B Negative Negative Final   Parainfluenza 1 Negative Negative Final   Parainfluenza 2 Negative Negative Final   Parainfluenza 3 Negative Negative Final   Metapneumovirus Negative Negative Final   Rhinovirus Negative Negative Final   Adenovirus Negative Negative Final    Comment: (NOTE) Performed At: [facility] [address] [place], [place] [id number] Mila HomerHancock William F MD NW:[id number]:[phone]   MRSA PCR Screening     Status: Abnormal   Collection Time: 08/02/14 10:59 AM  Result Value Ref Range Status   MRSA by PCR POSITIVE (A) NEGATIVE Final    Comment:        The GeneXpert MRSA Assay (FDA approved for NASAL specimens only), is one component of a comprehensive MRSA colonization surveillance program. It is not intended to diagnose MRSA infection nor to guide or monitor treatment for MRSA infections. RESULT CALLED TO, READ BACK BY AND VERIFIED WITH: SHELTON RN 13:20 08/02/14 ([email or web address])   Culture, sputum-assessment     Status: None   Collection Time: 08/03/14  8:57 AM  Result Value Ref Range Status   Specimen Description SPUTUM  Final   Special Requests NONE  Final   Sputum evaluation   Final    MICROSCOPIC FINDINGS SUGGEST THAT THIS SPECIMEN IS NOT REPRESENTATIVE OF LOWER RESPIRATORY SECRETIONS. PLEASE RECOLLECT. Gram Stain Report Called to,Read Back By and Verified With: Sherlean FootJ. Cleaver RN 11:25 08/03/14 ([email or web address])    Report Status 08/03/2014 FINAL  Final     Studies: No results found.  Scheduled Meds: . allopurinol  300 mg Oral Daily  . antiseptic oral rinse  7 mL Mouth Rinse BID  . aspirin EC  81 mg Oral Daily  . atorvastatin  10 mg Oral Daily  . budesonide  0.5 mg Nebulization BID  .  Chlorhexidine Gluconate Cloth  6  each Topical Q0600  . diltiazem  240 mg Oral Daily  . escitalopram  20 mg Oral Daily  . famotidine  20 mg Oral Daily  . ferrous sulfate  325 mg Oral BID WC  . gabapentin  300 mg Oral Daily  . levalbuterol  0.63 mg Nebulization QID  . loratadine  10 mg Oral Daily  . methylPREDNISolone (SOLU-MEDROL) injection  60 mg Intravenous Q12H  . mupirocin ointment  1 application Nasal BID  . [START ON 08/06/2014] torsemide  40 mg Oral BID  . Warfarin - Pharmacist Dosing Inpatient   Does not apply q1800   Continuous Infusions: . heparin 1,450 Units/hr (08/05/14 4098)    Principal Problem:   SOB (shortness of breath) Active Problems:   OBESITY-MORBID (>100')   Essential hypertension   EMBOLISM & INFARCTION, IATROGENIC PULMONARY   Atrial fibrillation   DIASTOLIC HEART FAILURE, CHRONIC   Abdominal aortic aneurysm   Allergic rhinitis   Acute on chronic diastolic heart failure   CHF exacerbation   Hx pulmonary embolism   History of DVT (deep vein thrombosis)   CAP (community acquired pneumonia)   Interstitial lung disease   Acute on chronic respiratory failure    Time spent: 30 min    [name] [name]  [facility] Pager [phone]. If 7PM-7AM, please contact night-coverage at [email or web address], password [facility] 08/05/2014, 9:46 AM  LOS: 4 days

## 2014-08-05 NOTE — Progress Notes (Signed)
ANTICOAGULATION CONSULT NOTE  Pharmacy Consult for Heparin Indication: atrial fibrillation  No Known Allergies  Patient Measurements: Height: 5\' 4"  (162.6 cm) Weight: 283 lb 14.4 oz (128.776 kg) IBW/kg (Calculated) : 54.7 Heparin Dosing Weight: 88 kg  Vital Signs: Temp: 97.9 F (36.6 C) (03/25 1144) Temp Source: Oral (03/25 1144) BP: 139/39 mmHg (03/25 1144) Pulse Rate: 62 (03/25 1144)  Labs:  Recent Labs  08/03/14 0253 08/04/14 0420 08/05/14 0500 08/05/14 1055  HGB 10.9* 12.1  --   --   HCT 35.8* 39.8  --   --   PLT 263 292  --   --   LABPROT 21.1* 22.5*  --  25.1*  INR 1.81* 1.96*  --  2.25*  HEPARINUNFRC 0.28* 0.58 0.87* 0.72*  CREATININE 1.27* 1.26* 1.37*  --     Estimated Creatinine Clearance: 43.6 mL/min (by C-G formula based on Cr of 1.37).  Medical History: Past Medical History  Diagnosis Date  . Congestive heart failure, unspecified     a. ECHO 6/0: EF 55-60%, mild LVH, mildly dilated RV w. mildly dec fx, RVSP 67  . Chronic atrial fibrillation     failed cardioversion in past  -repeat DC-CV on October 5th, 2010  . Personal history of DVT (deep vein thrombosis) 2008  . Obstructive sleep apnea   . HTN (hypertension)   . Morbid obesity   . Shingles     with postherpetic neuraigia  . GERD (gastroesophageal reflux disease)   . Hyperlipidemia   . Meningitis 1950s  . Gallstones     s/p cholecystectomy  . CAD (coronary artery disease)     nonobstructive by cath 8/10 - LAD 40-50% w mild PAH mean 29 w PVR 3.2 [place]  . AAA (abdominal aortic aneurysm)     small  . PE (pulmonary embolism)     history of  . Anemia   . Shortness of breath dyspnea   . Chronic kidney disease     ckd stage 2  . Lung interstitial disease   . Hypoxemia    Infusions:  . heparin 1,450 Units/hr (08/05/14 16100635)    Assessment: 81yo female with history of Afib presents with worsening SOB. Pharmacy consulted to dose warfarin and heparin bridge for atrial  fibrillation.  Heparin level this morning is 0.7 on 1450 units/hr. Level just above goal. INR has trended up to 2.25 and now at goal. No bleeding noted.  Heparin bridge no longer needed, will d/c.  PTA warfarin dose: 3.75mg  Tues, Thurs, Sun and 2.5mg  AODs.  Goal of Therapy:  INR 2-3 Heparin level 0.3-0.7 units/ml Monitor platelets by anticoagulation protocol: Yes   Plan:  Warfarin 3mg  tonight Continue daily INR for now D/c heparin  Sheppard CoilFrank Rashied Corallo PharmD., BCPS Clinical Pharmacist Pager [phone] 08/05/2014 1:29 PM

## 2014-08-05 NOTE — Progress Notes (Signed)
Occupational Therapy Treatment Patient Details Name: [name] [name] MRN: [id number] DOB: 1932-04-19 Today's Date: 08/05/2014    History of present illness Pt admitted with multifactoral acute on chronic respiratory failure and fever.  PMH: afib, CHF, interstitial lung disease, HTN, gout, DVT, PE, anemia and CKD.   OT comments  Participation limited by 02 desaturation. Pt on 4L 02 initially with 02 sats mid 90s.  Pt transferred to [facility] with sats decreasing into high 60s to low 70s.  02 increased to 6L, with pt slowly improving back into the 90s.  Will continue to follow.   Follow Up Recommendations  Home health OT    Equipment Recommendations  None recommended by OT    Recommendations for Other Services      Precautions / Restrictions Precautions Precautions: Fall Precaution Comments: watch 02       Mobility Bed Mobility                  Transfers Overall transfer level: Needs assistance   Transfers: Sit to/from Stand;Stand Pivot Transfers Sit to Stand: Min guard Stand pivot transfers: Min guard       General transfer comment: min guard for safety     Balance   Sitting-balance support: Feet supported Sitting balance-Leahy Scale: Good       Standing balance-Leahy Scale: Fair                     ADL                           Toilet Transfer: Min Leisure centre manager and Hygiene: Min guard;Sit to/from stand         General ADL Comments: Pt on 4L 02 with sats 94% upon therapist's entrance.  Pt performeed bil. UE AROM/exercise, then transferred to [facility]. Sats decreased to 71% on 4L.  02 increased to 5, then 6L with sats slowly increasing to 95%.  Once pt transferred back to recliner, attempted to reduce 02 back to 4L, but sats immediately decreased to 68%.  After a significant amount of time with pt in resting, pt able to maintain sats in the mid 90s on 4L; however, when pt began to eat lunch sats decreased  to the mid 70s - low 80s.   Informed the RN and pt left on 6L.        Vision                     Perception     Praxis      Cognition   Behavior During Therapy: WFL for tasks assessed/performed Overall Cognitive Status: Within Functional Limits for tasks assessed                       Extremity/Trunk Assessment               Exercises Other Exercises Other Exercises: Pt performed 10 reps, 2 sets AROM shoulder flexion seated in chair    Shoulder Instructions       General Comments      Pertinent Vitals/ Pain       Pain Assessment: No/denies pain  Home Living                                          Prior Functioning/Environment  Frequency Min 2X/week     Progress Toward Goals  OT Goals(current goals can now be found in the care plan section)  Progress towards OT goals: Not progressing toward goals - comment (due to 02 desaturation )  ADL Goals Pt Will Perform Grooming: with supervision;standing Pt Will Perform Lower Body Bathing: with supervision;sit to/from stand;with adaptive equipment Pt Will Perform Lower Body Dressing: with supervision;sit to/from stand;with adaptive equipment Pt Will Transfer to Toilet: with supervision;ambulating Pt Will Perform Toileting - Clothing Manipulation and hygiene: with supervision;sit to/from stand Pt Will Perform Tub/Shower Transfer: Shower transfer;with supervision;ambulating;rolling walker  Plan Discharge plan remains appropriate    Co-evaluation                 End of Session Equipment Utilized During Treatment: Oxygen   Activity Tolerance Other (comment) (02 desaturation )   Patient Left in chair;with call bell/phone within reach   Nurse Communication Mobility status        Time: [id number] OT Time Calculation (min): 28 min  Charges: OT General Charges $OT Visit: 1 Procedure OT Treatments $Self Care/Home Management : 8-22 mins $Therapeutic  Activity: 8-22 mins  Tenesia Escudero M 08/05/2014, 1:15 PM

## 2014-08-05 NOTE — Progress Notes (Signed)
DAILY PROGRESS NOTE  Subjective:  Breathing about the same. Diuresed -5L negative. Echo yesterday shows relatively preserved LV function. Creatinine is rising today.  Objective:  Temp:  [97.3 F (36.3 C)-97.9 F (36.6 C)] 97.9 F (36.6 C) (03/25 1144) Pulse Rate:  [55-71] 62 (03/25 1144) Resp:  [16-38] 22 (03/25 1144) BP: (103-140)/(39-88) 139/39 mmHg (03/25 1144) SpO2:  [86 %-99 %] 95 % (03/25 1144) FiO2 (%):  [40 %] 40 % (03/25 0400) Weight:  [283 lb 14.4 oz (128.776 kg)] 283 lb 14.4 oz (128.776 kg) (03/25 0600) Weight change:   Intake/Output from previous day: 03/24 0701 - 03/25 0700 In: 1563 [P.O.:1200; I.V.:363] Out: 2960 [Urine:2960]  Intake/Output from this shift:    Medications: Current Facility-Administered Medications  Medication Dose Route Frequency Provider Last Rate Last Dose  . allopurinol (ZYLOPRIM) tablet 300 mg  300 mg Oral Daily Ivor Costa, MD   300 mg at 08/05/14 1100  . antiseptic oral rinse (CPC / CETYLPYRIDINIUM CHLORIDE 0.05%) solution 7 mL  7 mL Mouth Rinse BID Belkys A Regalado, MD   7 mL at 08/05/14 1000  . aspirin EC tablet 81 mg  81 mg Oral Daily Ivor Costa, MD   81 mg at 08/05/14 1100  . atorvastatin (LIPITOR) tablet 10 mg  10 mg Oral Daily Ivor Costa, MD   10 mg at 08/05/14 1100  . budesonide (PULMICORT) nebulizer solution 0.5 mg  0.5 mg Nebulization BID Ivor Costa, MD   0.5 mg at 08/04/14 2046  . Chlorhexidine Gluconate Cloth 2 % PADS 6 each  6 each Topical Q0600 Janece Canterbury, MD   6 each at 08/05/14 0601  . diltiazem (CARDIZEM CD) 24 hr capsule 240 mg  240 mg Oral Daily Janece Canterbury, MD   240 mg at 08/05/14 1101  . escitalopram (LEXAPRO) tablet 20 mg  20 mg Oral Daily Ivor Costa, MD   20 mg at 08/05/14 1100  . famotidine (PEPCID) tablet 20 mg  20 mg Oral Daily Ivor Costa, MD   20 mg at 08/05/14 1100  . ferrous sulfate tablet 325 mg  325 mg Oral BID WC Janece Canterbury, MD   325 mg at 08/05/14 1059  . gabapentin (NEURONTIN) capsule 300 mg   300 mg Oral Daily Ivor Costa, MD   300 mg at 08/05/14 1100  . levalbuterol (XOPENEX) nebulizer solution 0.63 mg  0.63 mg Nebulization QID Ivor Costa, MD   0.63 mg at 08/05/14 1121  . loratadine (CLARITIN) tablet 10 mg  10 mg Oral Daily Ivor Costa, MD   10 mg at 08/05/14 1100  . methylPREDNISolone sodium succinate (SOLU-MEDROL) 125 mg/2 mL injection 60 mg  60 mg Intravenous Q12H Brand Males, MD   60 mg at 08/05/14 1100  . mupirocin ointment (BACTROBAN) 2 % 1 application  1 application Nasal BID Janece Canterbury, MD   1 application at 21/74/71 1103  . [START ON 08/06/2014] torsemide (DEMADEX) tablet 40 mg  40 mg Oral BID Janece Canterbury, MD      . Warfarin - Pharmacist Dosing Inpatient   Does not apply q1800 Lyndee Leo, RPH   0  at 08/04/14 1800    Physical Exam: General appearance: alert and no distress Neck: no carotid bruit and no JVD Lungs: diminished breath sounds bilaterally and rales bibasilar Heart: regular rate and rhythm Abdomen: soft, non-tender; bowel sounds normal; no masses,  no organomegaly and morbidly obese Extremities: edema 1+ LE edema Pulses: 2+ and symmetric Skin: Skin color, texture, turgor normal.  No rashes or lesions Neurologic: Grossly normal  Lab Results: Results for orders placed or performed during the hospital encounter of 08/01/14 (from the past 48 hour(s))  Protime-INR     Status: Abnormal   Collection Time: 08/04/14  4:20 AM  Result Value Ref Range   Prothrombin Time 22.5 (H) 11.6 - 15.2 seconds   INR 1.96 (H) 0.00 - 1.49  CBC     Status: Abnormal   Collection Time: 08/04/14  4:20 AM  Result Value Ref Range   WBC 15.2 (H) 4.0 - 10.5 K/uL   RBC 4.35 3.87 - 5.11 MIL/uL   Hemoglobin 12.1 12.0 - 15.0 g/dL   HCT 39.8 36.0 - 46.0 %   MCV 91.5 78.0 - 100.0 fL   MCH 27.8 26.0 - 34.0 pg   MCHC 30.4 30.0 - 36.0 g/dL   RDW 17.9 (H) 11.5 - 15.5 %   Platelets 292 150 - 400 K/uL  Heparin level (unfractionated)     Status: None   Collection Time: 08/04/14   4:20 AM  Result Value Ref Range   Heparin Unfractionated 0.58 0.30 - 0.70 IU/mL    Comment:        IF HEPARIN RESULTS ARE BELOW EXPECTED VALUES, AND PATIENT DOSAGE HAS BEEN CONFIRMED, SUGGEST FOLLOW UP TESTING OF ANTITHROMBIN III LEVELS.   Basic metabolic panel     Status: Abnormal   Collection Time: 08/04/14  4:20 AM  Result Value Ref Range   Sodium 143 135 - 145 mmol/L   Potassium 4.5 3.5 - 5.1 mmol/L    Comment: DELTA CHECK NOTED   Chloride 100 96 - 112 mmol/L   CO2 29 19 - 32 mmol/L   Glucose, Bld 141 (H) 70 - 99 mg/dL   BUN 22 6 - 23 mg/dL   Creatinine, Ser 1.26 (H) 0.50 - 1.10 mg/dL   Calcium 9.5 8.4 - 10.5 mg/dL   GFR calc non Af Amer 39 (L) >90 mL/min   GFR calc Af Amer 45 (L) >90 mL/min    Comment: (NOTE) The eGFR has been calculated using the CKD EPI equation. This calculation has not been validated in all clinical situations. eGFR's persistently <90 mL/min signify possible Chronic Kidney Disease.    Anion gap 14 5 - 15  Iron and TIBC     Status: Abnormal   Collection Time: 08/04/14  4:20 AM  Result Value Ref Range   Iron 24 (L) 42 - 145 ug/dL   TIBC 359 250 - 470 ug/dL   Saturation Ratios 7 (L) 20 - 55 %   UIBC 335 125 - 400 ug/dL    Comment: Performed at [facility]  Ferritin     Status: None   Collection Time: 08/04/14  4:20 AM  Result Value Ref Range   Ferritin 58 10 - 291 ng/mL    Comment: Performed at [facility]  Transferrin     Status: None   Collection Time: 08/04/14  4:20 AM  Result Value Ref Range   Transferrin 301 200 - 370 mg/dL    Comment: (NOTE) Performed At: [facility] [place], [place] [id number] Lindon Romp MD KN:[id number]   Vitamin B12     Status: Abnormal   Collection Time: 08/04/14  4:20 AM  Result Value Ref Range   Vitamin B-12 1314 (H) 211 - 911 pg/mL    Comment: Performed at [facility]  TSH     Status: None   Collection Time: 08/04/14  4:20 AM  Result Value Ref Range    TSH 1.149 0.350 - 4.500 uIU/mL  Procalcitonin     Status: None   Collection Time: 08/04/14  4:20 AM  Result Value Ref Range   Procalcitonin <0.10 ng/mL    Comment:        Interpretation: PCT (Procalcitonin) <= 0.5 ng/mL: Systemic infection (sepsis) is not likely. Local bacterial infection is possible. (NOTE)         ICU PCT Algorithm               Non ICU PCT Algorithm    ----------------------------     ------------------------------         PCT < 0.25 ng/mL                 PCT < 0.1 ng/mL     Stopping of antibiotics            Stopping of antibiotics       strongly encouraged.               strongly encouraged.    ----------------------------     ------------------------------       PCT level decrease by               PCT < 0.25 ng/mL       >= 80% from peak PCT       OR PCT 0.25 - 0.5 ng/mL          Stopping of antibiotics                                             encouraged.     Stopping of antibiotics           encouraged.    ----------------------------     ------------------------------       PCT level decrease by              PCT >= 0.25 ng/mL       < 80% from peak PCT        AND PCT >= 0.5 ng/mL            Continuin g antibiotics                                              encouraged.       Continuing antibiotics            encouraged.    ----------------------------     ------------------------------     PCT level increase compared          PCT > 0.5 ng/mL         with peak PCT AND          PCT >= 0.5 ng/mL             Escalation of antibiotics                                          strongly encouraged.      Escalation of antibiotics        strongly encouraged.   Heparin level (unfractionated)     Status:  Abnormal   Collection Time: 08/05/14  5:00 AM  Result Value Ref Range   Heparin Unfractionated 0.87 (H) 0.30 - 0.70 IU/mL    Comment:        IF HEPARIN RESULTS ARE BELOW EXPECTED VALUES, AND PATIENT DOSAGE HAS BEEN CONFIRMED, SUGGEST FOLLOW UP  TESTING OF ANTITHROMBIN III LEVELS.   Basic metabolic panel     Status: Abnormal   Collection Time: 08/05/14  5:00 AM  Result Value Ref Range   Sodium 140 135 - 145 mmol/L   Potassium 3.8 3.5 - 5.1 mmol/L   Chloride 100 96 - 112 mmol/L   CO2 32 19 - 32 mmol/L   Glucose, Bld 141 (H) 70 - 99 mg/dL   BUN 34 (H) 6 - 23 mg/dL   Creatinine, Ser 1.37 (H) 0.50 - 1.10 mg/dL   Calcium 9.4 8.4 - 10.5 mg/dL   GFR calc non Af Amer 35 (L) >90 mL/min   GFR calc Af Amer 41 (L) >90 mL/min    Comment: (NOTE) The eGFR has been calculated using the CKD EPI equation. This calculation has not been validated in all clinical situations. eGFR's persistently <90 mL/min signify possible Chronic Kidney Disease.    Anion gap 8 5 - 15  Procalcitonin     Status: None   Collection Time: 08/05/14  5:00 AM  Result Value Ref Range   Procalcitonin <0.10 ng/mL    Comment:        Interpretation: PCT (Procalcitonin) <= 0.5 ng/mL: Systemic infection (sepsis) is not likely. Local bacterial infection is possible. (NOTE)         ICU PCT Algorithm               Non ICU PCT Algorithm    ----------------------------     ------------------------------         PCT < 0.25 ng/mL                 PCT < 0.1 ng/mL     Stopping of antibiotics            Stopping of antibiotics       strongly encouraged.               strongly encouraged.    ----------------------------     ------------------------------       PCT level decrease by               PCT < 0.25 ng/mL       >= 80% from peak PCT       OR PCT 0.25 - 0.5 ng/mL          Stopping of antibiotics                                             encouraged.     Stopping of antibiotics           encouraged.    ----------------------------     ------------------------------       PCT level decrease by              PCT >= 0.25 ng/mL       < 80% from peak PCT        AND PCT >= 0.5 ng/mL            Continuin g antibiotics  encouraged.       Continuing antibiotics            encouraged.    ----------------------------     ------------------------------     PCT level increase compared          PCT > 0.5 ng/mL         with peak PCT AND          PCT >= 0.5 ng/mL             Escalation of antibiotics                                          strongly encouraged.      Escalation of antibiotics        strongly encouraged.   Heparin level (unfractionated)     Status: Abnormal   Collection Time: 08/05/14 10:55 AM  Result Value Ref Range   Heparin Unfractionated 0.72 (H) 0.30 - 0.70 IU/mL    Comment:        IF HEPARIN RESULTS ARE BELOW EXPECTED VALUES, AND PATIENT DOSAGE HAS BEEN CONFIRMED, SUGGEST FOLLOW UP TESTING OF ANTITHROMBIN III LEVELS.   Protime-INR     Status: Abnormal   Collection Time: 08/05/14 10:55 AM  Result Value Ref Range   Prothrombin Time 25.1 (H) 11.6 - 15.2 seconds   INR 2.25 (H) 0.00 - 1.49    Imaging: No results found.  Assessment:  Principal Problem:   SOB (shortness of breath) Active Problems:   OBESITY-MORBID (>100')   Essential hypertension   EMBOLISM & INFARCTION, IATROGENIC PULMONARY   Atrial fibrillation   DIASTOLIC HEART FAILURE, CHRONIC   Abdominal aortic aneurysm   Allergic rhinitis   Acute on chronic diastolic heart failure   CHF exacerbation   Hx pulmonary embolism   History of DVT (deep vein thrombosis)   CAP (community acquired pneumonia)   Interstitial lung disease   Acute on chronic respiratory failure   Plan:  Maintaining sinus rhythm. Diuresing well. Creatinine is rising - IM has changed to oral torsemide - I agree with this.  Time Spent Directly with Patient:  15 minutes  Length of Stay:  LOS: 4 days   Pixie Casino, MD, [facility] Attending Cardiologist [facility]  HILTY,Kenneth C 08/05/2014, 1:33 PM

## 2014-08-06 DIAGNOSIS — N179 Acute kidney failure, unspecified: Secondary | ICD-10-CM | POA: Insufficient documentation

## 2014-08-06 LAB — PROTIME-INR
INR: 2.49 — AB (ref 0.00–1.49)
PROTHROMBIN TIME: 27.1 s — AB (ref 11.6–15.2)

## 2014-08-06 LAB — BASIC METABOLIC PANEL
Anion gap: 7 (ref 5–15)
BUN: 39 mg/dL — AB (ref 6–23)
CHLORIDE: 101 mmol/L (ref 96–112)
CO2: 32 mmol/L (ref 19–32)
CREATININE: 1.35 mg/dL — AB (ref 0.50–1.10)
Calcium: 9.3 mg/dL (ref 8.4–10.5)
GFR calc Af Amer: 42 mL/min — ABNORMAL LOW (ref >90)
GFR, EST NON AFRICAN AMERICAN: 36 mL/min — AB (ref >90)
Glucose, Bld: 137 mg/dL — ABNORMAL HIGH (ref 70–99)
Potassium: 4.1 mmol/L (ref 3.5–5.1)
Sodium: 140 mmol/L (ref 135–145)

## 2014-08-06 LAB — CBC
HEMATOCRIT: 36.2 % (ref 36.0–46.0)
HEMOGLOBIN: 11.2 g/dL — AB (ref 12.0–15.0)
MCH: 27.8 pg (ref 26.0–34.0)
MCHC: 30.9 g/dL (ref 30.0–36.0)
MCV: 89.8 fL (ref 78.0–100.0)
Platelets: 297 10*3/uL (ref 150–400)
RBC: 4.03 MIL/uL (ref 3.87–5.11)
RDW: 17.7 % — ABNORMAL HIGH (ref 11.5–15.5)
WBC: 14.1 10*3/uL — AB (ref 4.0–10.5)

## 2014-08-06 LAB — HEPARIN LEVEL (UNFRACTIONATED): Heparin Unfractionated: <0.1 IU/mL — ABNORMAL LOW (ref 0.30–0.70)

## 2014-08-06 LAB — MAGNESIUM: Magnesium: 2.4 mg/dL (ref 1.5–2.5)

## 2014-08-06 LAB — PHOSPHORUS: Phosphorus: 4.2 mg/dL (ref 2.3–4.6)

## 2014-08-06 MED ORDER — PREDNISONE 20 MG PO TABS
40.0000 mg | ORAL_TABLET | Freq: Every day | ORAL | Status: DC
  Administered 2014-08-06 – 2014-08-07 (×2): 40 mg via ORAL
  Filled 2014-08-06 (×3): qty 2

## 2014-08-06 MED ORDER — WARFARIN SODIUM 2.5 MG PO TABS
2.5000 mg | ORAL_TABLET | Freq: Once | ORAL | Status: AC
  Administered 2014-08-06: 2.5 mg via ORAL
  Filled 2014-08-06: qty 1

## 2014-08-06 NOTE — Progress Notes (Signed)
DAILY PROGRESS NOTE  Subjective:  Breathing about the same. O2 reduced to 4L. Considering LTAC placement.  Objective:  Temp:  [97.4 F (36.3 C)-98 F (36.7 C)] 97.4 F (36.3 C) (03/26 0821) Pulse Rate:  [57-67] 60 (03/26 0430) Resp:  [18-22] 18 (03/26 0821) BP: (131-151)/(39-75) 151/75 mmHg (03/26 0821) SpO2:  [92 %-98 %] 98 % (03/26 0821) Weight change:   Intake/Output from previous day: 03/25 0701 - 03/26 0700 In: 807.5 [P.O.:700; I.V.:107.5] Out: 200 [Urine:200]  Intake/Output from this shift: Total I/O In: -  Out: 750 [Urine:750]  Medications: Current Facility-Administered Medications  Medication Dose Route Frequency Provider Last Rate Last Dose  . allopurinol (ZYLOPRIM) tablet 300 mg  300 mg Oral Daily Lorretta Harp, MD   300 mg at 08/06/14 0934  . antiseptic oral rinse (CPC / CETYLPYRIDINIUM CHLORIDE 0.05%) solution 7 mL  7 mL Mouth Rinse BID Belkys A Regalado, MD   7 mL at 08/06/14 0935  . aspirin EC tablet 81 mg  81 mg Oral Daily Lorretta Harp, MD   81 mg at 08/06/14 0934  . atorvastatin (LIPITOR) tablet 10 mg  10 mg Oral Daily Lorretta Harp, MD   10 mg at 08/06/14 0934  . budesonide (PULMICORT) nebulizer solution 0.5 mg  0.5 mg Nebulization BID Lorretta Harp, MD   0.5 mg at 08/06/14 0848  . Chlorhexidine Gluconate Cloth 2 % PADS 6 each  6 each Topical Q0600 Renae Fickle, MD   6 each at 08/06/14 [phone]  . diltiazem (CARDIZEM CD) 24 hr capsule 240 mg  240 mg Oral Daily Renae Fickle, MD   240 mg at 08/06/14 0934  . escitalopram (LEXAPRO) tablet 20 mg  20 mg Oral Daily Lorretta Harp, MD   20 mg at 08/06/14 0934  . famotidine (PEPCID) tablet 20 mg  20 mg Oral Daily Lorretta Harp, MD   20 mg at 08/06/14 0934  . ferrous sulfate tablet 325 mg  325 mg Oral BID WC Renae Fickle, MD   325 mg at 08/06/14 0900  . gabapentin (NEURONTIN) capsule 300 mg  300 mg Oral Daily Lorretta Harp, MD   300 mg at 08/06/14 0934  . levalbuterol (XOPENEX) nebulizer solution 0.63 mg  0.63 mg Nebulization QID Lorretta Harp, MD   0.63 mg at 08/06/14 1279  . loratadine (CLARITIN) tablet 10 mg  10 mg Oral Daily Lorretta Harp, MD   10 mg at 08/06/14 0934  . methylPREDNISolone sodium succinate (SOLU-MEDROL) 125 mg/2 mL injection 60 mg  60 mg Intravenous Q12H Kalman Shan, MD   60 mg at 08/06/14 0934  . mupirocin ointment (BACTROBAN) 2 % 1 application  1 application Nasal BID Renae Fickle, MD   1 application at 08/06/14 0935  . torsemide (DEMADEX) tablet 40 mg  40 mg Oral BID Renae Fickle, MD   40 mg at 08/06/14 0900  . Warfarin - Pharmacist Dosing Inpatient   Does not apply q1800 Earnie Larsson, RPH   0  at 08/04/14 1800    Physical Exam: General appearance: alert and no distress Neck: no carotid bruit and no JVD Lungs: diminished breath sounds bilaterally and rales bibasilar Heart: regular rate and rhythm Abdomen: soft, non-tender; bowel sounds normal; no masses,  no organomegaly and morbidly obese Extremities: edema 1+ LE edema Pulses: 2+ and symmetric Skin: Skin color, texture, turgor normal. No rashes or lesions Neurologic: Grossly normal  Lab Results: Results for orders placed or performed during the hospital encounter of 08/01/14 (from the past 48  hour(s))  Heparin level (unfractionated)     Status: Abnormal   Collection Time: 08/05/14  5:00 AM  Result Value Ref Range   Heparin Unfractionated 0.87 (H) 0.30 - 0.70 IU/mL    Comment:        IF HEPARIN RESULTS ARE BELOW EXPECTED VALUES, AND PATIENT DOSAGE HAS BEEN CONFIRMED, SUGGEST FOLLOW UP TESTING OF ANTITHROMBIN III LEVELS.   Basic metabolic panel     Status: Abnormal   Collection Time: 08/05/14  5:00 AM  Result Value Ref Range   Sodium 140 135 - 145 mmol/L   Potassium 3.8 3.5 - 5.1 mmol/L   Chloride 100 96 - 112 mmol/L   CO2 32 19 - 32 mmol/L   Glucose, Bld 141 (H) 70 - 99 mg/dL   BUN 34 (H) 6 - 23 mg/dL   Creatinine, Ser 1.37 (H) 0.50 - 1.10 mg/dL   Calcium 9.4 8.4 - 10.5 mg/dL   GFR calc non Af Amer 35 (L) >90 mL/min   GFR calc  Af Amer 41 (L) >90 mL/min    Comment: (NOTE) The eGFR has been calculated using the CKD EPI equation. This calculation has not been validated in all clinical situations. eGFR's persistently <90 mL/min signify possible Chronic Kidney Disease.    Anion gap 8 5 - 15  Procalcitonin     Status: None   Collection Time: 08/05/14  5:00 AM  Result Value Ref Range   Procalcitonin <0.10 ng/mL    Comment:        Interpretation: PCT (Procalcitonin) <= 0.5 ng/mL: Systemic infection (sepsis) is not likely. Local bacterial infection is possible. (NOTE)         ICU PCT Algorithm               Non ICU PCT Algorithm    ----------------------------     ------------------------------         PCT < 0.25 ng/mL                 PCT < 0.1 ng/mL     Stopping of antibiotics            Stopping of antibiotics       strongly encouraged.               strongly encouraged.    ----------------------------     ------------------------------       PCT level decrease by               PCT < 0.25 ng/mL       >= 80% from peak PCT       OR PCT 0.25 - 0.5 ng/mL          Stopping of antibiotics                                             encouraged.     Stopping of antibiotics           encouraged.    ----------------------------     ------------------------------       PCT level decrease by              PCT >= 0.25 ng/mL       < 80% from peak PCT        AND PCT >= 0.5 ng/mL            Continuin  g antibiotics                                              encouraged.       Continuing antibiotics            encouraged.    ----------------------------     ------------------------------     PCT level increase compared          PCT > 0.5 ng/mL         with peak PCT AND          PCT >= 0.5 ng/mL             Escalation of antibiotics                                          strongly encouraged.      Escalation of antibiotics        strongly encouraged.   Heparin level (unfractionated)     Status: Abnormal   Collection  Time: 08/05/14 10:55 AM  Result Value Ref Range   Heparin Unfractionated 0.72 (H) 0.30 - 0.70 IU/mL    Comment:        IF HEPARIN RESULTS ARE BELOW EXPECTED VALUES, AND PATIENT DOSAGE HAS BEEN CONFIRMED, SUGGEST FOLLOW UP TESTING OF ANTITHROMBIN III LEVELS.   Protime-INR     Status: Abnormal   Collection Time: 08/05/14 10:55 AM  Result Value Ref Range   Prothrombin Time 25.1 (H) 11.6 - 15.2 seconds   INR 2.25 (H) 0.00 - 1.49  Heparin level (unfractionated)     Status: Abnormal   Collection Time: 08/06/14  2:50 AM  Result Value Ref Range   Heparin Unfractionated <0.10 (L) 0.30 - 0.70 IU/mL    Comment:        IF HEPARIN RESULTS ARE BELOW EXPECTED VALUES, AND PATIENT DOSAGE HAS BEEN CONFIRMED, SUGGEST FOLLOW UP TESTING OF ANTITHROMBIN III LEVELS.   Basic metabolic panel     Status: Abnormal   Collection Time: 08/06/14  2:50 AM  Result Value Ref Range   Sodium 140 135 - 145 mmol/L   Potassium 4.1 3.5 - 5.1 mmol/L   Chloride 101 96 - 112 mmol/L   CO2 32 19 - 32 mmol/L   Glucose, Bld 137 (H) 70 - 99 mg/dL   BUN 39 (H) 6 - 23 mg/dL   Creatinine, Ser 1.35 (H) 0.50 - 1.10 mg/dL   Calcium 9.3 8.4 - 10.5 mg/dL   GFR calc non Af Amer 36 (L) >90 mL/min   GFR calc Af Amer 42 (L) >90 mL/min    Comment: (NOTE) The eGFR has been calculated using the CKD EPI equation. This calculation has not been validated in all clinical situations. eGFR's persistently <90 mL/min signify possible Chronic Kidney Disease.    Anion gap 7 5 - 15  CBC     Status: Abnormal   Collection Time: 08/06/14  2:50 AM  Result Value Ref Range   WBC 14.1 (H) 4.0 - 10.5 K/uL   RBC 4.03 3.87 - 5.11 MIL/uL   Hemoglobin 11.2 (L) 12.0 - 15.0 g/dL   HCT 36.2 36.0 - 46.0 %   MCV 89.8 78.0 - 100.0 fL   MCH 27.8 26.0 - 34.0 pg   MCHC 30.9  30.0 - 36.0 g/dL   RDW 17.7 (H) 11.5 - 15.5 %   Platelets 297 150 - 400 K/uL    Imaging: No results found.  Assessment:  Principal Problem:   SOB (shortness of  breath) Active Problems:   OBESITY-MORBID (>100')   Essential hypertension   EMBOLISM & INFARCTION, IATROGENIC PULMONARY   Atrial fibrillation   DIASTOLIC HEART FAILURE, CHRONIC   Abdominal aortic aneurysm   Allergic rhinitis   Acute on chronic diastolic heart failure   CHF exacerbation   Hx pulmonary embolism   History of DVT (deep vein thrombosis)   CAP (community acquired pneumonia)   Interstitial lung disease   Acute on chronic respiratory failure   Plan:  Maintaining sinus rhythm. Back on po torsemide. Creatinine stable today. No further cardiac suggestions. Will sign-off. Call with questions.  Time Spent Directly with Patient:  15 minutes  Length of Stay:  LOS: 5 days   Pixie Casino, MD, [facility] Attending Cardiologist [facility]  HILTY,Kenneth C 08/06/2014, 10:02 AM

## 2014-08-06 NOTE — Progress Notes (Signed)
ANTICOAGULATION CONSULT NOTE - Follow Up Consult  Pharmacy Consult for warfarin Indication: atrial fibrillation  No Known Allergies  Patient Measurements: Height: 5\' 4"  (162.6 cm) Weight: 283 lb 14.4 oz (128.776 kg) IBW/kg (Calculated) : 54.7  Vital Signs: Temp: 98.3 F (36.8 C) (03/26 1135) Temp Source: Oral (03/26 1135) BP: 124/52 mmHg (03/26 1135) Pulse Rate: 63 (03/26 1135)  Labs:  Recent Labs  08/04/14 0420 08/05/14 0500 08/05/14 1055 08/06/14 0250 08/06/14 1235  HGB 12.1  --   --  11.2*  --   HCT 39.8  38.2  --   --  36.2  --   PLT 292  --   --  297  --   LABPROT 22.5*  --  25.1*  --  27.1*  INR 1.96*  --  2.25*  --  2.49*  HEPARINUNFRC 0.58 0.87* 0.72* <0.10*  --   CREATININE 1.26* 1.37*  --  1.35*  --     Estimated Creatinine Clearance: 44.2 mL/min (by C-G formula based on Cr of 1.35).   Medications:  Scheduled:  . allopurinol  300 mg Oral Daily  . antiseptic oral rinse  7 mL Mouth Rinse BID  . aspirin EC  81 mg Oral Daily  . atorvastatin  10 mg Oral Daily  . budesonide  0.5 mg Nebulization BID  . Chlorhexidine Gluconate Cloth  6 each Topical Q0600  . diltiazem  240 mg Oral Daily  . escitalopram  20 mg Oral Daily  . famotidine  20 mg Oral Daily  . ferrous sulfate  325 mg Oral BID WC  . gabapentin  300 mg Oral Daily  . levalbuterol  0.63 mg Nebulization QID  . loratadine  10 mg Oral Daily  . mupirocin ointment  1 application Nasal BID  . predniSONE  40 mg Oral Q breakfast  . torsemide  40 mg Oral BID  . Warfarin - Pharmacist Dosing Inpatient   Does not apply q1800    Assessment: 81 yo f admitted with SOB.  Patient has a history of afib and pharmacy was consulted to begin heparin/warfarin bridge while INR subtherapeutic.  Heparin d/c'ed yesterday and INR today is therapeutic at 2.49. Hgb 11.2, plts 297, no s/s of bleeding.  Goal of Therapy:  INR 2-3 Monitor platelets by anticoagulation protocol: Yes   Plan:  Warfarin 2.5 mg x 1 tonight F/u  INR in AM to determine further dosing Monitor CBC, s/s of bleeding, clinical course  Daurice Ovando L. Roseanne RenoStewart, PharmD Clinical Pharmacy Resident Pager: [phone] 08/06/2014 2:04 PM

## 2014-08-06 NOTE — Progress Notes (Signed)
Name: [name] [name] MRN: [id number] DOB: [birth year]    ADMISSION DATE:  08/01/2014 CONSULTATION DATE:  3/22  REFERRING MD :  Sunnie Nielsen   CHIEF COMPLAINT:  Acute on chronic respiratory failure   BRIEF PATIENT DESCRIPTION:   This is a 81 year old female w/ chronic respiratory failure in the setting of ILD (w/ radiographic findings most c/w NSIP) ; ANA pos, SSA positive with hx of dry mouth. She was never biopsied due to concern that she would not tolerate procedure. Her respiratory status is further complicated by diastolic heart failure and secondary PAH. Based on initial evaluation back in Jan 2016 she was placed on a prednisone trial (starting at 40 mg daily for 2 weeks). She initially felt some improvement with this therapy.  On her best day she felt that she could ambulate close to 100 ft on 3.5 liters while on 40 of pred. Prior to that 10 feet would have been a challenge. However these benefits were short lived and outweighed by the side effects which included: weight gain (adipose tissue> water w/ 20 lb gain), fatigue, weeping form her legs and worsening dyspnea -->not ever as bad as before the pred. Based on this it was decided on her March 7 visit to attempt to wean the steroids to off. On 3/18 she had what sounded like a viral illness w/ 3 episodes of projectile vomiting; and low grade fever. She notes that following this her symptom burden really started to decline. On On 3/21 our office was called by the home health nurse stating her resting pulse ox on 3 liters was 69%, this increased to 82% on 5 liters. She notes that her dyspnea really began to take a turn for the worse after her decrease from 20 mg.  We recommended that she be seen. She was admitted on 3/21 w/ multifactorial respiratory failure. She was  Placed on IV lasix, empiric antibiotics and IV solu-cortef. We were asked to see and assist w/ management of this very complicated case.    SIGNIFICANT EVENTS  1/5: initial consult w/  [facility]. Started on Pred trial 40 mg for 2 weeks., then 30 mg x 1 month.  2/8 HHN calling our office. Reporting weight gain from 1/20 starting 267-->274; diuretic increased by her cardiologist.  2/24 seen in f/u by cards. Wt increased to 280 lbs. Blood work suggesting some volume depletion. Wt gain attributed to increased adipose tissue 3/7 [facility] visit. Wt now 285. Decided to wean off pred as side effects now worsening and dyspnea worsening.  3/18 low gd fever, projectile vomiting. Following this much worsening SOB 3/21: calling our office reporting worsening SOB 08/01/2014 - admit 08/02/14: PCCM cnsult 08/03/14: still on 15L [place].    SUBJECTIVE/OVERNIGHT/INTERVAL HX 3/26 usually sats 70's when moving at home and 88 when still on 3.5-4 liters, patient feels that she is at baseline at this point.  VITAL SIGNS: Temp:  [97.4 F (36.3 C)-98 F (36.7 C)] 97.4 F (36.3 C) (03/26 0821) Pulse Rate:  [57-67] 60 (03/26 0430) Resp:  [18-22] 18 (03/26 0821) BP: (131-151)/(39-75) 151/75 mmHg (03/26 0821) SpO2:  [92 %-98 %] 98 % (03/26 0821) Filed Weights   08/01/14 2223 08/02/14 0447 08/05/14 0600  Weight: 132.8 kg (292 lb 12.3 oz) 132.6 kg (292 lb 5.3 oz) 128.776 kg (283 lb 14.4 oz)   Intake/Output Summary (Last 24 hours) at 08/06/14 1044 Last data filed at 08/06/14 0734  Gross per 24 hour  Intake    388 ml  Output    950 ml  Net   -562 ml   PHYSICAL EXAMINATION: General:  Chronically ill appearing 81 year old female in no distress.  Neuro:  Awake, alert, no focal def  HEENT:  Neck large. No clear JVD, MMM, + wheeze  Cardiovascular:  RRR, Nl S1/S2, -M/R/G. Lungs:  Crackles in bases  Abdomen:  Obese + bowel sounds  Musculoskeletal:  Intact  Skin:  + LE edema w/ chronic venous stasis changes.   PULMONARY  Recent Labs Lab 08/03/14 0502  PHART 7.388  PCO2ART 57.3*  PO2ART 82.6  HCO3 33.7*  TCO2 35.5  O2SAT 94.8   CBC  Recent Labs Lab 08/03/14 0253 08/04/14 0420  08/06/14 0250  HGB 10.9* 12.1 11.2*  HCT 35.8* 39.8  38.2 36.2  WBC 13.8* 15.2* 14.1*  PLT 263 292 297   COAGULATION  Recent Labs Lab 08/01/14 1850 08/02/14 0320 08/03/14 0253 08/04/14 0420 08/05/14 1055  INR 1.53* 1.64* 1.81* 1.96* 2.25*   CARDIAC   Recent Labs Lab 08/01/14 2309 08/02/14 0320 08/02/14 1030  TROPONINI 0.11* 0.12* 0.06*   No results for input(s): PROBNP in the last 168 hours.  CHEMISTRY  Recent Labs Lab 08/02/14 0320 08/03/14 0253 08/04/14 0420 08/05/14 0500 08/06/14 0250  NA 146* 144 143 140 140  K 3.4* 3.4* 4.5 3.8 4.1  CL 100 100 100 100 101  CO2 34* 33* 29 32 32  GLUCOSE 102* 81 141* 141* 137*  BUN 25* 19 22 34* 39*  CREATININE 1.32* 1.27* 1.26* 1.37* 1.35*  CALCIUM 9.1 8.7 9.5 9.4 9.3  MG 2.2  --   --   --   --    Estimated Creatinine Clearance: 44.2 mL/min (by C-G formula based on Cr of 1.35).  LIVER  Recent Labs Lab 08/01/14 1850 08/02/14 0320 08/03/14 0253 08/04/14 0420 08/05/14 1055  INR 1.53* 1.64* 1.81* 1.96* 2.25*   INFECTIOUS  Recent Labs Lab 08/01/14 2309 08/03/14 1203 08/04/14 0420 08/05/14 0500  LATICACIDVEN 1.8  --   --   --   PROCALCITON  --  <0.10 <0.10 <0.10   ENDOCRINE CBG (last 3)  No results for input(s): GLUCAP in the last 72 hours.  MICRO Results for orders placed or performed during the hospital encounter of 08/01/14  Culture, blood (routine x 2) Call MD if unable to obtain prior to antibiotics being given     Status: None (Preliminary result)   Collection Time: 08/01/14 11:09 PM  Result Value Ref Range Status   Specimen Description BLOOD LEFT WRIST  Final   Special Requests BOTTLES DRAWN AEROBIC AND ANAEROBIC 8CC  Final   Culture   Final           BLOOD CULTURE RECEIVED NO GROWTH TO DATE CULTURE WILL BE HELD FOR 5 DAYS BEFORE ISSUING A FINAL NEGATIVE REPORT Performed at [facility]    Report Status PENDING  Incomplete  Culture, blood (routine x 2) Call MD if unable to obtain  prior to antibiotics being given     Status: None (Preliminary result)   Collection Time: 08/01/14 11:17 PM  Result Value Ref Range Status   Specimen Description BLOOD LEFT HAND  Final   Special Requests BOTTLES DRAWN AEROBIC AND ANAEROBIC 5CC  Final   Culture   Final           BLOOD CULTURE RECEIVED NO GROWTH TO DATE CULTURE WILL BE HELD FOR 5 DAYS BEFORE ISSUING A FINAL NEGATIVE REPORT Performed at [facility]  Report Status PENDING  Incomplete  Respiratory virus panel     Status: None   Collection Time: 08/02/14  2:17 AM  Result Value Ref Range Status   Source - RVPAN NASAL SWAB  Corrected   Respiratory Syncytial Virus A Negative Negative Final   Respiratory Syncytial Virus B Negative Negative Final   Influenza A Negative Negative Final   Influenza B Negative Negative Final   Parainfluenza 1 Negative Negative Final   Parainfluenza 2 Negative Negative Final   Parainfluenza 3 Negative Negative Final   Metapneumovirus Negative Negative Final   Rhinovirus Negative Negative Final   Adenovirus Negative Negative Final    Comment: (NOTE) Performed At: [facility] [address] [place], [place] [id number] Mila HomerHancock William F MD XB:[id number]:[phone]   MRSA PCR Screening     Status: Abnormal   Collection Time: 08/02/14 10:59 AM  Result Value Ref Range Status   MRSA by PCR POSITIVE (A) NEGATIVE Final    Comment:        The GeneXpert MRSA Assay (FDA approved for NASAL specimens only), is one component of a comprehensive MRSA colonization surveillance program. It is not intended to diagnose MRSA infection nor to guide or monitor treatment for MRSA infections. RESULT CALLED TO, READ BACK BY AND VERIFIED WITH: SHELTON RN 13:20 08/02/14 ([email or web address])   Culture, sputum-assessment     Status: None   Collection Time: 08/03/14  8:57 AM  Result Value Ref Range Status   Specimen Description SPUTUM  Final   Special Requests NONE  Final   Sputum evaluation   Final    MICROSCOPIC  FINDINGS SUGGEST THAT THIS SPECIMEN IS NOT REPRESENTATIVE OF LOWER RESPIRATORY SECRETIONS. PLEASE RECOLLECT. Gram Stain Report Called to,Read Back By and Verified With: Sherlean FootJ. Cleaver RN 11:25 08/03/14 ([email or web address])    Report Status 08/03/2014 FINAL  Final      Anti-infectives    Start     Dose/Rate Route Frequency Ordered Stop   08/02/14 2200  cefTRIAXone (ROCEPHIN) 1 g in dextrose 5 % 50 mL IVPB - Premix  Status:  Discontinued     1 g 100 mL/hr over 30 Minutes Intravenous Every 24 hours 08/01/14 2219 08/04/14 1148   08/02/14 2200  azithromycin (ZITHROMAX) 500 mg in dextrose 5 % 250 mL IVPB  Status:  Discontinued     500 mg 250 mL/hr over 60 Minutes Intravenous Every 24 hours 08/01/14 2219 08/04/14 1148   08/02/14 1000  oseltamivir (TAMIFLU) capsule 75 mg  Status:  Discontinued     75 mg Oral 2 times daily 08/02/14 0800 08/02/14 0846   08/02/14 1000  oseltamivir (TAMIFLU) capsule 30 mg  Status:  Discontinued     30 mg Oral 2 times daily 08/02/14 0846 08/03/14 0831   08/01/14 2030  cefTRIAXone (ROCEPHIN) 1 g in dextrose 5 % 50 mL IVPB     1 g 100 mL/hr over 30 Minutes Intravenous  Once 08/01/14 2022 08/01/14 2119   08/01/14 2030  azithromycin (ZITHROMAX) 500 mg in dextrose 5 % 250 mL IVPB     500 mg 250 mL/hr over 60 Minutes Intravenous  Once 08/01/14 2022 08/01/14 2207        IMAGING x48h No results found.      ASSESSMENT / PLAN:  Acute on Chronic Hypoxic Respiratory failure ILD (presumed NSIP by CT scan) OSA on CPAP Secondary PAH Decompensated diastolic HF Decompensated Cor pulmonale  Probable OHS w/ substantial weight gain and worsening deconditioning  Af (currently NSR but on amiodarone) Recent viral  illness  Obesity HTN  Prior PE/DVT   Multi-factorial Acute on Chronic respiratory failure on the basis of what is likely some combination of decompensated diastolic HF,cor pulmonale, obesity hypoventilation, and deconditioning super-imposed on underlying ILD (presumed NSIP  v Autoimmune ILD by CT scan) v Amio Tox  Patient feels that she is at baseline, she has an O2 sat monitor at home and sats are as above, she is doing the same here.  Recs Hold further diureses (on home dose of turosamide). Titrate O2 down to 3.5-4 liters at home flow rate. D/C solumedrol Start prednisone 40 mg PO and discharge on that until she is seen by Dr. Kendrick Fries as outpatient. Arrange close f/u with PCCM (preferably within a week). DC amio since 08/03/14 D/C Abx since PCT is normal Cont anticoagulation  Hold off on bronch BAL  PCCM will follow.  Alyson Reedy, M.D. [facility] Pulmonary/Critical Care Medicine. Pager: [phone]. After hours pager: [phone].  08/06/2014 10:44 AM

## 2014-08-06 NOTE — Progress Notes (Signed)
[facility] PROGRESS NOTE  [name] [name] ZOX:[id number] DOB: 07/17/1931 DOA: 08/01/2014 PCP: [name], MD  Brief Summary  The patient is an 81-yo female with history of HTN, GERD, gout, a-fib, hx of DVT and PE on coumadin, small AAA, anemia, CKD II/III, diastolic heart failure, interstitial lung disease on 3.5L home oxygen and recently started on steroids without benefit, who presented with shortness of breath.  She was started on 40 mg of prednisone daily in January 2016 with some benefit, however, she developed weight gain and water retention. Her steroids were then tapered, however, her shortness of breath got progressively worse. She increased her oxygen to 5 L but continued to have dyspnea. Just prior to admission, she developed a fever to 100.6 Fahrenheit and a cough productive of mucus. She presented to the emergency department where she was found to have mild AK I with a troponin of 0.1, BNP 658, white blood cell count 14.2. Her chest x-ray demonstrated congestive heart failure changes with patchy left lung infiltrate.  She required bipap and has been reliant on venti mask for several days, recently transitioned to 6L [place].  Pulmonology and cardiology consulted.    Assessment/Plan  Acute on Chronic Respiratory Failure, multifactorial due to ILD, Pulmonary HTN, Diastolic HF, and possible CAP  -  Continue to attempt to wean -  Tx for diastolic heart failure as below -  Continue ceftriaxone and azithromycin day 6 of 7 -  Continue nebulizer treatments -  Continue BID steroids today and taper tomorrow if continuing to improve -  Appreciate PCCM assistance  -  HRCT chest:  Consistent with pulmonary edema or atypical/viral pneumonia and not particularly consistent with UIP.  Left pleural effusion, enlarged pulmonary arteries, slightly enlarged mediastinal lymph nodes -  Resume coumadin as were only planning possible BAL and not biopsy  Acute on chronic diastolic congestive heart  failure: 2-D echo on 02/23/14 showed EF 60-65% with a grade 1 diastolic dysfunction. Patient is on torsemide 40 mg twice a day at home.  BNP is 658, body weight increased to 20 pounds, congestion on CXR and CT -  BUN and creatinine rising -  IV lasix stopped 3/25 -  Continue torsemide -  Daily weights:  128.7kg today -  Strict I/O:  +61400mL yesterday  CAP; chest x ray with patchy consolidation Left Lung base.  - Continue with IV ceftriaxone and azithro - Influenza neg - Respiratory panel negative.  - Strep Pneumonia negative.  - Legionella negative.   Acute on CKD stage II, creatinine stable  Elevated trop: 0.10 and approximately stable. It is most likely due to congestive heart failure exacerbation and stress. Patient does not have chest pain. EKG has no significant change compared with previous EKG. -trop x 3:  Trended down slightly to 0.06 as creatinine improved -continue aspirin, Lipitor -ECHO with preserved LV.   HTN, blood pressures decreasing -  Continue tapering steroids -  Continue increased dilt -  Avoid BB given wheeze/respiratory distress  Paroxysmal atrial Fibrillation: CHA2DS2-VASc Score is 5, need oral anticoagulation. Patient is on coumadin at home. INR is 1.53 on admission, subtherapeutic.  Needed multiple cardioversions previously and was only finally maintained on amiodarone - continue coumadin - amiodarone discontinued  - appreciate cardiology assistance - continue increased Cardizem - Tele:  NSR, rate 60s  NS v-tach, 17-beats -  Potassium wnl -  Check phos and magnesium -  Will d/c telemetry -  Continue CCB  Remote history of DVT and pulmonary embolism: 6  years ago which was treated. No signs of new DVT -  Warfarin per pharm  Right shin oozing lesion: - appreciate wound care assistance  Normocytic anemia -  Iron studies c/w iron deficiency -  Started iron supplementation -  b12 wnl, folate wnl, TSH 1.149 -  Occult stool pending  Hypokalemia  due to diuresis, resolved with oral potassium repletion  Leukocytosis likely secondary to steroids, decreasing  Diet:  Healthy heart Access:  PIV IVF:  off Proph:  Warfarin  Code Status: Full Family Communication: patient alone Disposition Plan: pending further improvement in breathing.  Consider transfer to LTAC   Consultants:  Pulm  Cardiology  Procedures:  CXR  HRCT chest  Antibiotics:  Ceftriaxone 3/21 >>  Azithromycin 3/21 >>  HPI/Subjective:  Feeling less Chawn Spraggins of breath.  Had episode of O2 sat in low 80s on bipap last night.    Objective: Filed Vitals:   08/06/14 0112 08/06/14 0332 08/06/14 0430 08/06/14 0821  BP:  151/75  151/75  Pulse: 57 57 60   Temp:  97.6 F (36.4 C)  97.4 F (36.3 C)  TempSrc:  Axillary  Oral  Resp: Height:      Weight:      SpO2: 97% 92% 95% 98%    Intake/Output Summary (Last 24 hours) at 08/06/14 0931 Last data filed at 08/06/14 0734  Gross per 24 hour  Intake    567 ml  Output    950 ml  Net   -383 ml   Filed Weights   08/01/14 2223 08/02/14 0447 08/05/14 0600  Weight: 132.8 kg (292 lb 12.3 oz) 132.6 kg (292 lb 5.3 oz) 128.776 kg (283 lb 14.4 oz)    Exam:   General:  Adult female, on [place], sitting in chair  HEENT:  NCAT, mildly dry MM, skin appears dry  Cardiovascular:  RRR, nl S1, S2 no mrg, 2+ pulses, warm extremities  Respiratory:  Faint rales at bilateral bases, stable from prior no obvious wheezes or rhonchi  Abdomen:   NABS, soft, NT/ND  MSK:   Normal tone and bulk, trace bilateral pitting LEE  Neuro:  Grossly moves all extremities  Skin: Skin graft right anterior shin  Data Reviewed: Basic Metabolic Panel:  Recent Labs Lab 08/02/14 0320 08/03/14 0253 08/04/14 0420 08/05/14 0500 08/06/14 0250  NA 146* 144 143 140 140  K 3.4* 3.4* 4.5 3.8 4.1  CL 100 100 100 100 101  CO2 34* 33* 29 32 32  GLUCOSE 102* 81 141* 141* 137*  BUN 25* 19 22 34* 39*  CREATININE 1.32* 1.27* 1.26*  1.37* 1.35*  CALCIUM 9.1 8.7 9.5 9.4 9.3  MG 2.2  --   --   --   --    Liver Function Tests: No results for input(s): AST, ALT, ALKPHOS, BILITOT, PROT, ALBUMIN in the last 168 hours. No results for input(s): LIPASE, AMYLASE in the last 168 hours. No results for input(s): AMMONIA in the last 168 hours. CBC:  Recent Labs Lab 08/01/14 1850 08/02/14 0320 08/03/14 0253 08/04/14 0420 08/06/14 0250  WBC 14.2* 12.5* 13.8* 15.2* 14.1*  HGB 12.4 11.5* 10.9* 12.1 11.2*  HCT 40.9 37.8 35.8* 39.8  38.2 36.2  MCV 91.3 91.1 91.6 91.5 89.8  PLT 248 249 263 292 297   Cardiac Enzymes:  Recent Labs Lab 08/01/14 2309 08/02/14 0320 08/02/14 1030  TROPONINI 0.11* 0.12* 0.06*   BNP (last 3 results)  Recent Labs  08/01/14 1850  BNP 658.4*  ProBNP (last 3 results)  Recent Labs  02/22/14 0947 02/24/14 0332 03/07/14 0648  PROBNP 2319.0* 2410.0* 437.5    CBG: No results for input(s): GLUCAP in the last 168 hours.  Recent Results (from the past 240 hour(s))  Culture, blood (routine x 2) Call MD if unable to obtain prior to antibiotics being given     Status: None (Preliminary result)   Collection Time: 08/01/14 11:09 PM  Result Value Ref Range Status   Specimen Description BLOOD LEFT WRIST  Final   Special Requests BOTTLES DRAWN AEROBIC AND ANAEROBIC 8CC  Final   Culture   Final           BLOOD CULTURE RECEIVED NO GROWTH TO DATE CULTURE WILL BE HELD FOR 5 DAYS BEFORE ISSUING A FINAL NEGATIVE REPORT Performed at [facility]    Report Status PENDING  Incomplete  Culture, blood (routine x 2) Call MD if unable to obtain prior to antibiotics being given     Status: None (Preliminary result)   Collection Time: 08/01/14 11:17 PM  Result Value Ref Range Status   Specimen Description BLOOD LEFT HAND  Final   Special Requests BOTTLES DRAWN AEROBIC AND ANAEROBIC 5CC  Final   Culture   Final           BLOOD CULTURE RECEIVED NO GROWTH TO DATE CULTURE WILL BE HELD FOR 5 DAYS  BEFORE ISSUING A FINAL NEGATIVE REPORT Performed at [facility]    Report Status PENDING  Incomplete  Respiratory virus panel     Status: None   Collection Time: 08/02/14  2:17 AM  Result Value Ref Range Status   Source - RVPAN NASAL SWAB  Corrected   Respiratory Syncytial Virus A Negative Negative Final   Respiratory Syncytial Virus B Negative Negative Final   Influenza A Negative Negative Final   Influenza B Negative Negative Final   Parainfluenza 1 Negative Negative Final   Parainfluenza 2 Negative Negative Final   Parainfluenza 3 Negative Negative Final   Metapneumovirus Negative Negative Final   Rhinovirus Negative Negative Final   Adenovirus Negative Negative Final    Comment: (NOTE) Performed At: [facility] [address] [place], [place] [id number] Mila Homer MD WU:[id number]   MRSA PCR Screening     Status: Abnormal   Collection Time: 08/02/14 10:59 AM  Result Value Ref Range Status   MRSA by PCR POSITIVE (A) NEGATIVE Final    Comment:        The GeneXpert MRSA Assay (FDA approved for NASAL specimens only), is one component of a comprehensive MRSA colonization surveillance program. It is not intended to diagnose MRSA infection nor to guide or monitor treatment for MRSA infections. RESULT CALLED TO, READ BACK BY AND VERIFIED WITH: SHELTON RN 13:20 08/02/14 ([email or web address])   Culture, sputum-assessment     Status: None   Collection Time: 08/03/14  8:57 AM  Result Value Ref Range Status   Specimen Description SPUTUM  Final   Special Requests NONE  Final   Sputum evaluation   Final    MICROSCOPIC FINDINGS SUGGEST THAT THIS SPECIMEN IS NOT REPRESENTATIVE OF LOWER RESPIRATORY SECRETIONS. PLEASE RECOLLECT. Gram Stain Report Called to,Read Back By and Verified With: Sherlean Foot RN 11:25 08/03/14 ([email or web address])    Report Status 08/03/2014 FINAL  Final     Studies: No results found.  Scheduled Meds: . allopurinol  300 mg Oral Daily  . antiseptic oral  rinse  7 mL Mouth Rinse BID  . aspirin EC  81 mg Oral Daily  . atorvastatin  10 mg Oral Daily  . budesonide  0.5 mg Nebulization BID  . Chlorhexidine Gluconate Cloth  6 each Topical Q0600  . diltiazem  240 mg Oral Daily  . escitalopram  20 mg Oral Daily  . famotidine  20 mg Oral Daily  . ferrous sulfate  325 mg Oral BID WC  . gabapentin  300 mg Oral Daily  . levalbuterol  0.63 mg Nebulization QID  . loratadine  10 mg Oral Daily  . methylPREDNISolone (SOLU-MEDROL) injection  60 mg Intravenous Q12H  . mupirocin ointment  1 application Nasal BID  . torsemide  40 mg Oral BID  . Warfarin - Pharmacist Dosing Inpatient   Does not apply q1800   Continuous Infusions:    Principal Problem:   SOB (shortness of breath) Active Problems:   OBESITY-MORBID (>100')   Essential hypertension   EMBOLISM & INFARCTION, IATROGENIC PULMONARY   Atrial fibrillation   DIASTOLIC HEART FAILURE, CHRONIC   Abdominal aortic aneurysm   Allergic rhinitis   Acute on chronic diastolic heart failure   CHF exacerbation   Hx pulmonary embolism   History of DVT (deep vein thrombosis)   CAP (community acquired pneumonia)   Interstitial lung disease   Acute on chronic respiratory failure    Time spent: 30 min    Kawthar Ennen  [facility] Pager [phone]. If 7PM-7AM, please contact night-coverage at [email or web address], password [facility] 08/06/2014, 9:31 AM  LOS: 5 days

## 2014-08-06 NOTE — Evaluation (Signed)
Physical Therapy Evaluation Patient Details Name: Aneta Minseggy T Hertenstein MRN: [id number] DOB: 10/31/1931 Today's Date: 08/06/2014   History of Present Illness  Pt admitted with multifactoral acute on chronic respiratory failure and fever.  PMH: afib, CHF, interstitial lung disease, HTN, gout, DVT, PE, anemia and CKD.  Clinical Impression  Patient presents with problems listed below.  Will benefit from acute PT to maximize independence prior to discharge. Recommend HHPT at discharge to continue therapy.    Follow Up Recommendations Home health PT;Supervision/Assistance - 24 hour    Equipment Recommendations  None recommended by PT    Recommendations for Other Services       Precautions / Restrictions Precautions Precautions: Fall Precaution Comments: watch 02 Restrictions Weight Bearing Restrictions: No      Mobility  Bed Mobility               General bed mobility comments: Patient in chair as PT entered room  Transfers Overall transfer level: Needs assistance Equipment used: 1 person hand held assist Transfers: Sit to/from Stand;Stand Pivot Transfers Sit to Stand: Min guard Stand pivot transfers: Min assist       General transfer comment: Patient uses correct hand placement.  Assist for balance/safety with transfers chair <> BSC.  O2 sats decreased into 70's with transfers on 4 liters O2.  Increased O2 to 6 liters and encouraged pursed-lip breathing.  O2 sats returned to 93%.  Returned O2 to 4 l/min at end of session, maintaining O2 sats at 91-92%.  Ambulation/Gait                Stairs            Wheelchair Mobility    Modified Rankin (Stroke Patients Only)       Balance Overall balance assessment: Needs assistance         Standing balance support: Single extremity supported Standing balance-Leahy Scale: Poor                               Pertinent Vitals/Pain Pain Assessment: No/denies pain    Home Living Family/patient  expects to be discharged to:: Private residence Living Arrangements: Children (Son) Available Help at Discharge: Family;Personal care attendant;Available 24 hours/day (Sitter) Type of Home: House Home Access: Stairs to enter Entrance Stairs-Rails: Left;Right;Can reach both Entrance Stairs-Number of Steps: 2 Home Layout: One level Home Equipment: Shower seat - built in;Grab bars - tub/shower;Walker - 2 wheels;Transport chair;Bedside commode;Other (comment);Adaptive equipment (Lift chair) Additional Comments: Son lives with her; has sitter 6 hours a day    Prior Function Level of Independence: Needs assistance   Gait / Transfers Assistance Needed: Uses RW and has lift chair and elevated height bed.    ADL's / Homemaking Assistance Needed: (A) with cooking/cleaning; reports she can dress and bathe herself         Hand Dominance   Dominant Hand: Right    Extremity/Trunk Assessment   Upper Extremity Assessment: Generalized weakness           Lower Extremity Assessment: Generalized weakness (Edema BLE's; per chart, wound Rt shin)         Communication   Communication: No difficulties  Cognition Arousal/Alertness: Awake/alert Behavior During Therapy: WFL for tasks assessed/performed Overall Cognitive Status: Within Functional Limits for tasks assessed                      General Comments  Exercises        Assessment/Plan    PT Assessment Patient needs continued PT services  PT Diagnosis Difficulty walking;Abnormality of gait;Generalized weakness   PT Problem List Decreased strength;Decreased activity tolerance;Decreased balance;Decreased mobility;Decreased knowledge of use of DME;Cardiopulmonary status limiting activity;Obesity  PT Treatment Interventions DME instruction;Gait training;Functional mobility training;Therapeutic activities;Therapeutic exercise;Patient/family education   PT Goals (Current goals can be found in the Care Plan section) Acute  Rehab PT Goals Patient Stated Goal: return home PT Goal Formulation: With patient Time For Goal Achievement: 08/13/14 Potential to Achieve Goals: Good    Frequency Min 3X/week   Barriers to discharge        Co-evaluation               End of Session Equipment Utilized During Treatment: Oxygen Activity Tolerance: Patient tolerated treatment well;Treatment limited secondary to medical complications (Comment) (Decrease in O2 sats) Patient left: in chair;with call bell/phone within reach Nurse Communication: Mobility status (O2 sat response with mobility)         Time: 1610-9604 PT Time Calculation (min) (ACUTE ONLY): 19 min   Charges:   PT Evaluation $Initial PT Evaluation Tier I: 1 Procedure     PT G CodesVena Austria 08/30/14, 4:34 PM Durenda Hurt. Renaldo Fiddler, [facility] Acute Rehab Services Pager [phone]

## 2014-08-06 NOTE — Progress Notes (Signed)
Placed patient on BIPAP for the night.  Patient is tolerating well at this time. 

## 2014-08-07 DIAGNOSIS — Z86711 Personal history of pulmonary embolism: Secondary | ICD-10-CM

## 2014-08-07 LAB — PROTIME-INR
INR: 2.68 — AB (ref 0.00–1.49)
Prothrombin Time: 28.8 seconds — ABNORMAL HIGH (ref 11.6–15.2)

## 2014-08-07 MED ORDER — FERROUS SULFATE 325 (65 FE) MG PO TABS: 325.0000 mg | ORAL_TABLET | Freq: Two times a day (BID) | ORAL | Status: DC

## 2014-08-07 MED ORDER — PREDNISONE 20 MG PO TABS: 40.0000 mg | ORAL_TABLET | Freq: Every day | ORAL | Status: DC

## 2014-08-07 MED ORDER — WARFARIN SODIUM 2 MG PO TABS
2.0000 mg | ORAL_TABLET | Freq: Once | ORAL | Status: DC
  Filled 2014-08-07: qty 1

## 2014-08-07 MED ORDER — DILTIAZEM HCL ER COATED BEADS 240 MG PO CP24: 240.0000 mg | ORAL_CAPSULE | Freq: Every day | ORAL | Status: DC

## 2014-08-07 NOTE — Progress Notes (Signed)
Name: [name] [name] MRN: [id number] DOB: May 19, 1931    ADMISSION DATE:  08/01/2014 CONSULTATION DATE:  3/22  REFERRING MD :  Sunnie Nielsen   CHIEF COMPLAINT:  Acute on chronic respiratory failure   BRIEF PATIENT DESCRIPTION:   This is a 81 year old female w/ chronic respiratory failure in the setting of ILD (w/ radiographic findings most c/w NSIP) ; ANA pos, SSA positive with hx of dry mouth. She was never biopsied due to concern that she would not tolerate procedure. Her respiratory status is further complicated by diastolic heart failure and secondary PAH. Based on initial evaluation back in Jan 2016 she was placed on a prednisone trial (starting at 40 mg daily for 2 weeks). She initially felt some improvement with this therapy.  On her best day she felt that she could ambulate close to 100 ft on 3.5 liters while on 40 of pred. Prior to that 10 feet would have been a challenge. However these benefits were short lived and outweighed by the side effects which included: weight gain (adipose tissue> water w/ 20 lb gain), fatigue, weeping form her legs and worsening dyspnea -->not ever as bad as before the pred. Based on this it was decided on her March 7 visit to attempt to wean the steroids to off. On 3/18 she had what sounded like a viral illness w/ 3 episodes of projectile vomiting; and low grade fever. She notes that following this her symptom burden really started to decline. On On 3/21 our office was called by the home health nurse stating her resting pulse ox on 3 liters was 69%, this increased to 82% on 5 liters. She notes that her dyspnea really began to take a turn for the worse after her decrease from 20 mg.  We recommended that she be seen. She was admitted on 3/21 w/ multifactorial respiratory failure. She was  Placed on IV lasix, empiric antibiotics and IV solu-cortef. We were asked to see and assist w/ management of this very complicated case.    SIGNIFICANT EVENTS  1/5: initial consult w/  Mcquaid. Started on Pred trial 40 mg for 2 weeks., then 30 mg x 1 month.  2/8 HHN calling our office. Reporting weight gain from 1/20 starting 267-->274; diuretic increased by her cardiologist.  2/24 seen in f/u by cards. Wt increased to 280 lbs. Blood work suggesting some volume depletion. Wt gain attributed to increased adipose tissue 3/7 McQuaid visit. Wt now 285. Decided to wean off pred as side effects now worsening and dyspnea worsening.  3/18 low gd fever, projectile vomiting. Following this much worsening SOB 3/21: calling our office reporting worsening SOB 08/01/2014 - admit 08/02/14: PCCM cnsult 08/03/14: still on 15L [place].    SUBJECTIVE/OVERNIGHT/INTERVAL HX 3/26 usually sats 70's when moving at home and 88 when still on 3.5-4 liters, patient feels that she is at baseline at this point.  VITAL SIGNS: Temp:  [97.5 F (36.4 C)-98.3 F (36.8 C)] 98.3 F (36.8 C) (03/27 0827) Pulse Rate:  [57-64] 64 (03/27 0827) Resp:  [18] 18 (03/27 0827) BP: (124-146)/(48-83) 133/83 mmHg (03/27 0827) SpO2:  [96 %-99 %] 98 % (03/27 0916) Filed Weights   08/01/14 2223 08/02/14 0447 08/05/14 0600  Weight: 132.8 kg (292 lb 12.3 oz) 132.6 kg (292 lb 5.3 oz) 128.776 kg (283 lb 14.4 oz)    Intake/Output Summary (Last 24 hours) at 08/07/14 1027 Last data filed at 08/07/14 0600  Gross per 24 hour  Intake  0 ml  Output   1500 ml  Net  -1500 ml   PHYSICAL EXAMINATION: General:  Chronically ill appearing 81 year old female in no distress.  Neuro:  Awake, alert, no focal def  HEENT:  Neck large. No clear JVD, MMM, + wheeze  Cardiovascular:  RRR, Nl S1/S2, -M/R/G. Lungs:  Crackles in bases  Abdomen:  Obese + bowel sounds  Musculoskeletal:  Intact  Skin:  + LE edema w/ chronic venous stasis changes.   PULMONARY  Recent Labs Lab 08/03/14 0502  PHART 7.388  PCO2ART 57.3*  PO2ART 82.6  HCO3 33.7*  TCO2 35.5  O2SAT 94.8   CBC  Recent Labs Lab 08/03/14 0253 08/04/14 0420  08/06/14 0250  HGB 10.9* 12.1 11.2*  HCT 35.8* 39.8  38.2 36.2  WBC 13.8* 15.2* 14.1*  PLT 263 292 297   COAGULATION  Recent Labs Lab 08/03/14 0253 08/04/14 0420 08/05/14 1055 08/06/14 1235 08/07/14 0300  INR 1.81* 1.96* 2.25* 2.49* 2.68*   CARDIAC    Recent Labs Lab 08/01/14 2309 08/02/14 0320 08/02/14 1030  TROPONINI 0.11* 0.12* 0.06*   No results for input(s): PROBNP in the last 168 hours.  CHEMISTRY  Recent Labs Lab 08/02/14 0320 08/03/14 0253 08/04/14 0420 08/05/14 0500 08/06/14 0250 08/06/14 0935  NA 146* 144 143 140 140  --   K 3.4* 3.4* 4.5 3.8 4.1  --   CL 100 100 100 100 101  --   CO2 34* 33* 29 32 32  --   GLUCOSE 102* 81 141* 141* 137*  --   BUN 25* 19 22 34* 39*  --   CREATININE 1.32* 1.27* 1.26* 1.37* 1.35*  --   CALCIUM 9.1 8.7 9.5 9.4 9.3  --   MG 2.2  --   --   --   --  2.4  PHOS  --   --   --   --   --  4.2   Estimated Creatinine Clearance: 44.2 mL/min (by C-G formula based on Cr of 1.35).  LIVER  Recent Labs Lab 08/03/14 0253 08/04/14 0420 08/05/14 1055 08/06/14 1235 08/07/14 0300  INR 1.81* 1.96* 2.25* 2.49* 2.68*   INFECTIOUS  Recent Labs Lab 08/01/14 2309 08/03/14 1203 08/04/14 0420 08/05/14 0500  LATICACIDVEN 1.8  --   --   --   PROCALCITON  --  <0.10 <0.10 <0.10   ENDOCRINE CBG (last 3)  No results for input(s): GLUCAP in the last 72 hours.  MICRO Results for orders placed or performed during the hospital encounter of 08/01/14  Culture, blood (routine x 2) Call MD if unable to obtain prior to antibiotics being given     Status: None (Preliminary result)   Collection Time: 08/01/14 11:09 PM  Result Value Ref Range Status   Specimen Description BLOOD LEFT WRIST  Final   Special Requests BOTTLES DRAWN AEROBIC AND ANAEROBIC 8CC  Final   Culture   Final           BLOOD CULTURE RECEIVED NO GROWTH TO DATE CULTURE WILL BE HELD FOR 5 DAYS BEFORE ISSUING A FINAL NEGATIVE REPORT Performed at [facility]     Report Status PENDING  Incomplete  Culture, blood (routine x 2) Call MD if unable to obtain prior to antibiotics being given     Status: None (Preliminary result)   Collection Time: 08/01/14 11:17 PM  Result Value Ref Range Status   Specimen Description BLOOD LEFT HAND  Final   Special Requests BOTTLES DRAWN AEROBIC  AND ANAEROBIC 5CC  Final   Culture   Final           BLOOD CULTURE RECEIVED NO GROWTH TO DATE CULTURE WILL BE HELD FOR 5 DAYS BEFORE ISSUING A FINAL NEGATIVE REPORT Performed at [facility]    Report Status PENDING  Incomplete  Respiratory virus panel     Status: None   Collection Time: 08/02/14  2:17 AM  Result Value Ref Range Status   Source - RVPAN NASAL SWAB  Corrected   Respiratory Syncytial Virus A Negative Negative Final   Respiratory Syncytial Virus B Negative Negative Final   Influenza A Negative Negative Final   Influenza B Negative Negative Final   Parainfluenza 1 Negative Negative Final   Parainfluenza 2 Negative Negative Final   Parainfluenza 3 Negative Negative Final   Metapneumovirus Negative Negative Final   Rhinovirus Negative Negative Final   Adenovirus Negative Negative Final    Comment: (NOTE) Performed At: [facility] [address] [place], [place] [id number] Mila Homer MD OZ:[id number]   MRSA PCR Screening     Status: Abnormal   Collection Time: 08/02/14 10:59 AM  Result Value Ref Range Status   MRSA by PCR POSITIVE (A) NEGATIVE Final    Comment:        The GeneXpert MRSA Assay (FDA approved for NASAL specimens only), is one component of a comprehensive MRSA colonization surveillance program. It is not intended to diagnose MRSA infection nor to guide or monitor treatment for MRSA infections. RESULT CALLED TO, READ BACK BY AND VERIFIED WITH: SHELTON RN 13:20 08/02/14 ([email or web address])   Culture, sputum-assessment     Status: None   Collection Time: 08/03/14  8:57 AM  Result Value Ref Range Status   Specimen  Description SPUTUM  Final   Special Requests NONE  Final   Sputum evaluation   Final    MICROSCOPIC FINDINGS SUGGEST THAT THIS SPECIMEN IS NOT REPRESENTATIVE OF LOWER RESPIRATORY SECRETIONS. PLEASE RECOLLECT. Gram Stain Report Called to,Read Back By and Verified With: Sherlean Foot RN 11:25 08/03/14 ([email or web address])    Report Status 08/03/2014 FINAL  Final      Anti-infectives    Start     Dose/Rate Route Frequency Ordered Stop   08/02/14 2200  cefTRIAXone (ROCEPHIN) 1 g in dextrose 5 % 50 mL IVPB - Premix  Status:  Discontinued     1 g 100 mL/hr over 30 Minutes Intravenous Every 24 hours 08/01/14 2219 08/04/14 1148   08/02/14 2200  azithromycin (ZITHROMAX) 500 mg in dextrose 5 % 250 mL IVPB  Status:  Discontinued     500 mg 250 mL/hr over 60 Minutes Intravenous Every 24 hours 08/01/14 2219 08/04/14 1148   08/02/14 1000  oseltamivir (TAMIFLU) capsule 75 mg  Status:  Discontinued     75 mg Oral 2 times daily 08/02/14 0800 08/02/14 0846   08/02/14 1000  oseltamivir (TAMIFLU) capsule 30 mg  Status:  Discontinued     30 mg Oral 2 times daily 08/02/14 0846 08/03/14 0831   08/01/14 2030  cefTRIAXone (ROCEPHIN) 1 g in dextrose 5 % 50 mL IVPB     1 g 100 mL/hr over 30 Minutes Intravenous  Once 08/01/14 2022 08/01/14 2119   08/01/14 2030  azithromycin (ZITHROMAX) 500 mg in dextrose 5 % 250 mL IVPB     500 mg 250 mL/hr over 60 Minutes Intravenous  Once 08/01/14 2022 08/01/14 2207        IMAGING x48h No results found.  ASSESSMENT / PLAN:  Acute on Chronic Hypoxic Respiratory failure ILD (presumed NSIP by CT scan) OSA on CPAP Secondary PAH Decompensated diastolic HF Decompensated Cor pulmonale  Probable OHS w/ substantial weight gain and worsening deconditioning  Af (currently NSR but on amiodarone) Recent viral illness  Obesity HTN  Prior PE/DVT   Multi-factorial Acute on Chronic respiratory failure on the basis of what is likely some combination of decompensated diastolic  HF,cor pulmonale, obesity hypoventilation, and deconditioning super-imposed on underlying ILD (presumed NSIP v Autoimmune ILD by CT scan) v Amio Tox  Patient feels that she is at baseline, she has an O2 sat monitor at home and sats are as above, she is doing the same here.  Recs Hold further diureses (on home dose of turosamide). Titrate O2 down to 3.5-4 liters at home flow rate. D/C solumedrol Continue prednisone 40 mg PO and discharge on that until she is seen by Dr. [name] as outpatient. Arrange close f/u with PCCM (preferably within a week). DC amio since 08/03/14 D/C Abx since PCT is normal Cont anticoagulation  Hold off on bronch BAL CPAP while asleep  I personally reviewed her CXR, at baseline at this point.  PCCM will sign off, recommendations as above, please call back if needed.  Alyson ReedyWesam G. Yacoub, M.D. [facility]. Pager: [phone]. After hours pager: [phone].  08/07/2014 10:27 AM

## 2014-08-07 NOTE — Progress Notes (Signed)
ANTICOAGULATION CONSULT NOTE - Follow Up Consult  Pharmacy Consult for warfarin Indication: atrial fibrillation  No Known Allergies  Patient Measurements: Height: 5\' 4"  (162.6 cm) Weight: 283 lb 14.4 oz (128.776 kg) IBW/kg (Calculated) : 54.7  Vital Signs: Temp: 98.3 F (36.8 C) (03/27 0827) Temp Source: Oral (03/27 0827) BP: 133/83 mmHg (03/27 0827) Pulse Rate: 64 (03/27 0827)  Labs:  Recent Labs  08/05/14 0500 08/05/14 1055 08/06/14 0250 08/06/14 1235 08/07/14 0300  HGB  --   --  11.2*  --   --   HCT  --   --  36.2  --   --   PLT  --   --  297  --   --   LABPROT  --  25.1*  --  27.1* 28.8*  INR  --  2.25*  --  2.49* 2.68*  HEPARINUNFRC 0.87* 0.72* <0.10*  --   --   CREATININE 1.37*  --  1.35*  --   --     Estimated Creatinine Clearance: 44.2 mL/min (by C-G formula based on Cr of 1.35).   Medications:  Scheduled:  . allopurinol  300 mg Oral Daily  . antiseptic oral rinse  7 mL Mouth Rinse BID  . aspirin EC  81 mg Oral Daily  . atorvastatin  10 mg Oral Daily  . budesonide  0.5 mg Nebulization BID  . Chlorhexidine Gluconate Cloth  6 each Topical Q0600  . diltiazem  240 mg Oral Daily  . escitalopram  20 mg Oral Daily  . famotidine  20 mg Oral Daily  . ferrous sulfate  325 mg Oral BID WC  . gabapentin  300 mg Oral Daily  . levalbuterol  0.63 mg Nebulization QID  . loratadine  10 mg Oral Daily  . mupirocin ointment  1 application Nasal BID  . predniSONE  40 mg Oral Q breakfast  . torsemide  40 mg Oral BID  . Warfarin - Pharmacist Dosing Inpatient   Does not apply q1800    Assessment: 82 w f admitted with SOB.  Pharmacy is consulted to dose warfarin for afib. INR is therapeutic today at 2.68. Hgb 11.2, plts 297, no s/s of bleeding, no issues per RN.  Goal of Therapy:  INR 2-3 Monitor platelets by anticoagulation protocol: Yes   Plan:  Warfarin 2 mg x 1 tonight F/u INR in the AM to determine further dosing Monitor CBC, s/s of bleeding, clinical  course  Cassie L. Roseanne RenoStewart, PharmD Clinical Pharmacy Resident Pager: [phone] 08/07/2014 8:32 AM

## 2014-08-07 NOTE — Discharge Summary (Addendum)
Physician Discharge Summary  [name] [name] ZOX:[id number] DOB: November 18, 1931 DOA: 08/01/2014  PCP: Kerby Nora, MD  Admit date: 08/01/2014 Discharge date: 08/07/2014  Recommendations for Outpatient Follow-up:  1. HH PT/OT/RN 2. Continue home oxygen 4L 3. Continue prednisone 40mg  daily 4. F/u with Dr. Kendrick Fries within 1-2 weeks of discharge.  Pulmonary rehab referral if indicated.  Patient states she would like to discuss additional therapeutics risk/benefit given her kidney disease again  5. F/u with cardiology within 2-3 weeks for follow up of changes to amiodarone and diltiazem 6. INR check on or around Thursday.   7. PCP to please repeat anemia panel in about 1 month.  Defer further investigation into source of anemia to PCP.  Given severity of lung disease, doubt she would be a good candidate for procedures such as colonoscopy.    Discharge Diagnoses:  Principal Problem:   SOB (shortness of breath) Active Problems:   OBESITY-MORBID (>100')   Essential hypertension   EMBOLISM & INFARCTION, IATROGENIC PULMONARY   Atrial fibrillation   DIASTOLIC HEART FAILURE, CHRONIC   Abdominal aortic aneurysm   Allergic rhinitis   Acute on chronic diastolic heart failure   CHF exacerbation   Hx pulmonary embolism   History of DVT (deep vein thrombosis)   CAP (community acquired pneumonia)   Interstitial lung disease   Acute on chronic respiratory failure   AKI (acute kidney injury)   Discharge Condition: stable, improved  Diet recommendation: healthy heart  Wt Readings from Last 3 Encounters:  08/05/14 128.776 kg (283 lb 14.4 oz)  07/18/14 129.275 kg (285 lb)  07/06/14 127.347 kg (280 lb 12 oz)    History of present illness:   The patient is an 61-yo female with history of HTN, GERD, gout, a-fib, hx of DVT and PE on coumadin, small AAA, anemia, CKD II/III, diastolic heart failure, interstitial lung disease on 3.5-4L home oxygen and recently started on steroids without benefit, who  presented with shortness of breath. She was started on 40 mg of prednisone daily in January 2016 with some benefit, however, she developed weight gain and water retention. Her steroids were then tapered, however, her shortness of breath got progressively worse. She increased her oxygen to 5 L but continued to have dyspnea. Just prior to admission, she developed a fever to 100.6 Fahrenheit and a cough productive of mucus. She presented to the emergency department where she was found to have mild AK I with a troponin of 0.1, BNP 658, white blood cell count 14.2. Her chest x-ray demonstrated congestive heart failure changes with patchy left lung infiltrate. She required bipap and has been reliant on venti mask for several days, recently transitioned to 6L [place]. Pulmonology and cardiology consulted. She was diuresed and started on high dose steroids and had a slow recovery.  Her amiodarone was discontinued and she is at risk of having recurrence of her a-fib which was symptomatic previously requiring DCCV, however, her lung disease has been progressing and it was felt to be important that she stop all offending agents at this time.    Hospital Course:   Acute on Chronic Respiratory Failure, multifactorial due to ILD, Pulmonary HTN, Diastolic HF, and possible CAP. -  Initially placed on bipap and gradually transitioned to venti mask 45%  -  Was able to tolerate nasal canula after 4 days on venti mask - Currently down to 4L at rest with desaturations with exertion - Tx for diastolic heart failure as below - completed 7 days of ceftriaxone and  azithromycin - Continued nebulizer treatments - Appreciate PCCM assistance:  Recommend prednisone  daily until follow up with Dr. Kendrick Fries - HRCT chest: Consistent with pulmonary edema or atypical/viral pneumonia and not particularly consistent with UIP. Left pleural effusion, enlarged pulmonary arteries, slightly enlarged mediastinal lymph nodes  Acute  on chronic diastolic congestive heart failure: 2-D echo on 02/23/14 showed EF 60-65% with a grade 1 diastolic dysfunction. Patient is on torsemide 40 mg twice a day at home. BNP is 658, body weight increased to 20 pounds, congestion on CXR and CT - Diuresed with IV lasix -8.4L during admission -  Weight on dishrage 128.7kg -  BUN and creatinine began to rise and she was started back on torsemide -  Continue daily weights and call cardiology should she gain more than 5-lbs from the time of discharge or more than 3-lbs in one day -  Continue low sodium diet  CAP; chest x ray with patchy consolidation Left Lung base.  - completed 7 days of ceftriaxone and azithro - Influenza neg - Respiratory panel negative.  - Strep Pneumonia negative.  - Legionella negative.   Acute on CKD stage II, creatinine stable  Elevated trop: 0.10 and approximately stable. It is most likely due to congestive heart failure exacerbation and stress. Patient does not have chest pain. EKG has no significant change compared with previous EKG. -trop x 3: Trended down slightly to 0.06 as creatinine improved -continued aspirin, Lipitor -ECHO with preserved LV.   HTN, blood pressures stable - Increased dilt - Avoided BB given wheeze/respiratory distress  Paroxysmal atrial Fibrillation: CHA2DS2-VASc Score is 5, need oral anticoagulation. Patient is on coumadin at home. INR is 1.53 on admission, subtherapeutic. Needed multiple cardioversions previously and was only finally maintained on amiodarone - continued coumadin at home dose, but required higher dosing during admission to get back to therapeutic range, 10/0/4/3/2.5mg  over last five days - amiodarone discontinued due to progressive lung disease - appreciate cardiology assistance  - continue increased Cardizem - Tele: NSR, rate 60s  NS v-tach, 17-beats - Potassium wnl - Check phos and magnesium which were wnl - Continued CCB  Remote history of DVT and  pulmonary embolism: 6 years ago which was treated. No signs of new DVT - Warfarin continued  Right shin oozing lesion: - appreciate wound care assistance  Normocytic anemia - Iron studies c/w iron deficiency - Started iron supplementation - b12 wnl, folate wnl, TSH 1.149 - Occult stool not obtained  Hypokalemia due to diuresis, resolved with oral potassium repletion  Leukocytosis likely secondary to steroids, decreasing  Consultants:  Pulm  Cardiology  Procedures:  CXR  HRCT chest  Antibiotics:  Ceftriaxone 3/21 >> 3/27  Azithromycin 3/21 >> 3/27  Discharge Exam: Filed Vitals:   08/07/14 1200  BP: 136/62  Pulse: 63  Temp: 97.7 F (36.5 C)  Resp: 18   Filed Vitals:   08/07/14 0827 08/07/14 0916 08/07/14 1200 08/07/14 1600  BP: 133/83  136/62   Pulse: 64  63   Temp: 98.3 F (36.8 C)  97.7 F (36.5 C)   TempSrc: Oral  Oral   Resp: 18  18   Height:      Weight:      SpO2: 97% 98% 97% 99%     General: Adult female, on [place], sitting in chair, smiling and no acute distress, able to talk in complete sentences  HEENT: NCAT, mildly dry MM, skin appears dry  Cardiovascular: RRR, nl S1, S2 no mrg, 2+ pulses, warm extremities  Respiratory: Faint rales at bilateral bases, stable from prior no obvious wheezes or rhonchi  Abdomen: NABS, soft, NT/ND  MSK: Normal tone and bulk, trace bilateral pitting LEE  Neuro: Grossly moves all extremities  Skin: Skin graft right anterior shin  Discharge Instructions      Discharge Instructions    (HEART FAILURE PATIENTS) Call MD:  Anytime you have any of the following symptoms: 1) 3 pound weight gain in 24 hours or 5 pounds in 1 week 2) shortness of breath, with or without a dry hacking cough 3) swelling in the hands, feet or stomach 4) if you have to sleep on extra pillows at night in order to breathe.    Complete by:  As directed      Call MD for:  difficulty breathing, headache or visual disturbances     Complete by:  As directed      Call MD for:  extreme fatigue    Complete by:  As directed      Call MD for:  hives    Complete by:  As directed      Call MD for:  persistant dizziness or light-headedness    Complete by:  As directed      Call MD for:  persistant nausea and vomiting    Complete by:  As directed      Call MD for:  severe uncontrolled pain    Complete by:  As directed      Call MD for:  temperature >100.4    Complete by:  As directed      Diet - low sodium heart healthy    Complete by:  As directed      Discharge instructions    Complete by:  As directed   You were hospitalized with shortness of breath secondary to heart failure and to your lung disease.  For your heart failure, please do three things:  1. Weigh yourself every day and if you gain more than 3-lbs in one day or 5-lbs from the time you arrive home today, please call the cardiology office.  2. Take your torsemide as prescribed, and 3.  Continue to eat a low salt diet.  Please STOP your amiodarone.  Please increaser your diltiazem to 240mg  daily - I have given you a knew prescription.  Please take prednisone 40mg  every day until you follow up with Dr. Kendrick Fries.  You are a little iron deficient, so I have given you a prescription for iron tabs.  Please talk to your primary care doctor about checking your anemia level and iron level in about a month.  If you have worsening shortness of breath, please call Dr. Ulyses Jarred office.  If you have severe shortness of breath or chest pain or if you have a fainting spell, please call 911.  Please have your INR checked on or around Thursday of this week.     Increase activity slowly    Complete by:  As directed             Medication List    STOP taking these medications        amiodarone 200 MG tablet  Commonly known as:  PACERONE      TAKE these medications        allopurinol 300 MG tablet  Commonly known as:  ZYLOPRIM  Take 1 tablet (300 mg total) by mouth daily.      aspirin 81 MG tablet  Take 81 mg by mouth daily.  atorvastatin 10 MG tablet  Commonly known as:  LIPITOR  Take 1 tablet (10 mg total) by mouth daily.     budesonide 0.5 MG/2ML nebulizer solution  Commonly known as:  PULMICORT  Take 2 mLs (0.5 mg total) by nebulization 2 (two) times daily.     colchicine 0.6 MG tablet  Take 0.6 mg by mouth as needed (for flareup).     diltiazem 240 MG 24 hr capsule  Commonly known as:  CARDIZEM CD  Take 1 capsule (240 mg total) by mouth daily.     escitalopram 20 MG tablet  Commonly known as:  LEXAPRO  Take 1 tablet (20 mg total) by mouth daily.     famotidine 20 MG tablet  Commonly known as:  PEPCID  Take 1 tablet (20 mg total) by mouth daily.     ferrous sulfate 325 (65 FE) MG tablet  Take 1 tablet (325 mg total) by mouth 2 (two) times daily with a meal.     gabapentin 300 MG capsule  Commonly known as:  NEURONTIN  Take 1 capsule (300 mg total) by mouth daily.     levalbuterol 0.63 MG/3ML nebulizer solution  Commonly known as:  XOPENEX  Take 3 mLs (0.63 mg total) by nebulization 4 (four) times daily.     OVER THE COUNTER MEDICATION  Inhale 1 application into the lungs at bedtime. USES BIPAP EVERY BEDTIME FOR SLEEP     potassium chloride SA 20 MEQ tablet  Commonly known as:  K-DUR,KLOR-CON  Take 20 mEq by mouth daily.     predniSONE 20 MG tablet  Commonly known as:  DELTASONE  Take 2 tablets (40 mg total) by mouth daily with breakfast.     torsemide 20 MG tablet  Commonly known as:  DEMADEX  Take 2 tablets (40 mg total) by mouth 2 (two) times daily.     warfarin 2.5 MG tablet  Commonly known as:  COUMADIN  Take as directed.     ZYRTEC ALLERGY 10 MG tablet  Generic drug:  cetirizine  Take 10 mg by mouth as needed for allergies.       Follow-up Information    Follow up with Kerby Nora, MD In 1 month.   Specialty:  Family Medicine   Why:  As needed   Contact information:   [address] [place]  [place] [address] [phone]       Follow up with Julien Nordmann, MD. Schedule an appointment as soon as possible for a visit in 2 weeks.   Specialty:  Cardiology   Contact information:   [address] [place] [address] [phone]       Follow up with Max Fickle, MD. Schedule an appointment as soon as possible for a visit in 1 week.   Specialty:  Pulmonary Disease   Contact information:   [address] [place] [address] [phone]        The results of significant diagnostics from this hospitalization (including imaging, microbiology, ancillary and laboratory) are listed below for reference.    Significant Diagnostic Studies: Ct Chest High Resolution  08/03/2014   CLINICAL DATA:  Interstitial lung disease, decreased O2 sats, prednisone taper, subsequent encounter.  EXAM: CT CHEST WITHOUT CONTRAST  TECHNIQUE: Multidetector CT imaging of the chest was performed following the standard protocol without intravenous contrast. High resolution imaging of the lungs, as well as inspiratory and expiratory imaging, was performed.  COMPARISON:  03/01/2014 and 11/09/2006.  FINDINGS: Mediastinum/Nodes: Mediastinal lymph nodes  measure up to 1.3 cm in [place] Cordial axis in AP window (previously 1.0 cm). Hilar regions are difficult to definitively evaluate without IV contrast. No axillary adenopathy. Atherosclerotic calcification of the arterial vasculature, including coronary arteries. Pulmonary arteries and heart are enlarged. No pericardial effusion.  Lungs/Pleura: There are patchy areas of ground-glass with interstitial thickening, coarsening, mild architectural distortion and subpleural reticulation, without a definite zonal predominance. Dependent atelectasis and/or scarring in the right hemi thorax, similar to 03/01/2014. Probable tiny left pleural effusion.  Upper abdomen: Visualized portions of the liver, gallbladder and right adrenal gland are unremarkable. A 1.9 cm nodule in the left adrenal  gland is only partially imaged. Visualized portions of the spleen and stomach are grossly unremarkable. No upper abdominal adenopathy.  Musculoskeletal: No worrisome lytic or sclerotic lesions. Degenerative changes are seen in the spine.  IMPRESSION: 1. Pulmonary parenchymal pattern of patchy ground-glass, mild interstitial thickening, coarsening, mild architectural distortion and subpleural reticulation, without a definite zonal predominance, suspicious for pulmonary edema or atypical/viral pneumonia superimposed on interstitial lung disease. Evaluation of underlying interstitial lung disease, and assessment of progression, are difficult given the suspected superimposed acute findings. It can be said, however, that the findings are not consistent with usual interstitial pneumonitis (UIP) and may be due to underlying postinflammatory fibrosis. 2. Tiny left pleural effusion. 3. Enlarged pulmonary arteries, indicative of pulmonary arterial hypertension. 4. Slight enlargement of mediastinal lymph nodes, likely reactive. 5. Coronary artery calcification. 6. Left adrenal nodule, incompletely imaged.   Electronically Signed   By: Leanna Battles M.D.   On: 08/03/2014 08:36   Dg Chest Port 1 View  08/01/2014   CLINICAL DATA:  Shortness of breath  EXAM: PORTABLE CHEST - 1 VIEW  COMPARISON:  March 19, 2014  FINDINGS: The mediastinal contour is normal. The heart size is enlarged. The aorta is tortuous. There is increased bilateral pulmonary interstitium. Patchy consolidation of left lung base is noted. There is no pleural effusion. The visualized skeletal structures are stable.  IMPRESSION: Congestive heart failure.  Patchy consolidation of left lung base, superimposed pneumonia is not excluded.   Electronically Signed   By: Sherian Rein M.D.   On: 08/01/2014 19:18    Microbiology: Recent Results (from the past 240 hour(s))  Culture, blood (routine x 2) Call MD if unable to obtain prior to antibiotics being given      Status: None (Preliminary result)   Collection Time: 08/01/14 11:09 PM  Result Value Ref Range Status   Specimen Description BLOOD LEFT WRIST  Final   Special Requests BOTTLES DRAWN AEROBIC AND ANAEROBIC 8CC  Final   Culture   Final           BLOOD CULTURE RECEIVED NO GROWTH TO DATE CULTURE WILL BE HELD FOR 5 DAYS BEFORE ISSUING A FINAL NEGATIVE REPORT Performed at [facility]    Report Status PENDING  Incomplete  Culture, blood (routine x 2) Call MD if unable to obtain prior to antibiotics being given     Status: None (Preliminary result)   Collection Time: 08/01/14 11:17 PM  Result Value Ref Range Status   Specimen Description BLOOD LEFT HAND  Final   Special Requests BOTTLES DRAWN AEROBIC AND ANAEROBIC 5CC  Final   Culture   Final           BLOOD CULTURE RECEIVED NO GROWTH TO DATE CULTURE WILL BE HELD FOR 5 DAYS BEFORE ISSUING A FINAL NEGATIVE REPORT Performed at [facility]    Report Status PENDING  Incomplete  Respiratory virus panel     Status: None   Collection Time: 08/02/14  2:17 AM  Result Value Ref Range Status   Source - RVPAN NASAL SWAB  Corrected   Respiratory Syncytial Virus A Negative Negative Final   Respiratory Syncytial Virus B Negative Negative Final   Influenza A Negative Negative Final   Influenza B Negative Negative Final   Parainfluenza 1 Negative Negative Final   Parainfluenza 2 Negative Negative Final   Parainfluenza 3 Negative Negative Final   Metapneumovirus Negative Negative Final   Rhinovirus Negative Negative Final   Adenovirus Negative Negative Final    Comment: (NOTE) Performed At: [facility] [address] [place], [place] [id number] Mila Homer MD WU:[id number]   MRSA PCR Screening     Status: Abnormal   Collection Time: 08/02/14 10:59 AM  Result Value Ref Range Status   MRSA by PCR POSITIVE (A) NEGATIVE Final    Comment:        The GeneXpert MRSA Assay (FDA approved for NASAL specimens only), is one  component of a comprehensive MRSA colonization surveillance program. It is not intended to diagnose MRSA infection nor to guide or monitor treatment for MRSA infections. RESULT CALLED TO, READ BACK BY AND VERIFIED WITH: SHELTON RN 13:20 08/02/14 ([email or web address])   Culture, sputum-assessment     Status: None   Collection Time: 08/03/14  8:57 AM  Result Value Ref Range Status   Specimen Description SPUTUM  Final   Special Requests NONE  Final   Sputum evaluation   Final    MICROSCOPIC FINDINGS SUGGEST THAT THIS SPECIMEN IS NOT REPRESENTATIVE OF LOWER RESPIRATORY SECRETIONS. PLEASE RECOLLECT. Gram Stain Report Called to,Read Back By and Verified With: Sherlean Foot RN 11:25 08/03/14 ([email or web address])    Report Status 08/03/2014 FINAL  Final     Labs: Basic Metabolic Panel:  Recent Labs Lab 08/02/14 0320 08/03/14 0253 08/04/14 0420 08/05/14 0500 08/06/14 0250 08/06/14 0935  NA 146* 144 143 140 140  --   K 3.4* 3.4* 4.5 3.8 4.1  --   CL 100 100 100 100 101  --   CO2 34* 33* 29 32 32  --   GLUCOSE 102* 81 141* 141* 137*  --   BUN 25* 19 22 34* 39*  --   CREATININE 1.32* 1.27* 1.26* 1.37* 1.35*  --   CALCIUM 9.1 8.7 9.5 9.4 9.3  --   MG 2.2  --   --   --   --  2.4  PHOS  --   --   --   --   --  4.2   Liver Function Tests: No results for input(s): AST, ALT, ALKPHOS, BILITOT, PROT, ALBUMIN in the last 168 hours. No results for input(s): LIPASE, AMYLASE in the last 168 hours. No results for input(s): AMMONIA in the last 168 hours. CBC:  Recent Labs Lab 08/01/14 1850 08/02/14 0320 08/03/14 0253 08/04/14 0420 08/06/14 0250  WBC 14.2* 12.5* 13.8* 15.2* 14.1*  HGB 12.4 11.5* 10.9* 12.1 11.2*  HCT 40.9 37.8 35.8* 39.8  38.2 36.2  MCV 91.3 91.1 91.6 91.5 89.8  PLT 248 249 263 292 297   Cardiac Enzymes:  Recent Labs Lab 08/01/14 2309 08/02/14 0320 08/02/14 1030  TROPONINI 0.11* 0.12* 0.06*   BNP: BNP (last 3 results)  Recent Labs  08/01/14 1850  BNP 658.4*    ProBNP (last  3 results)  Recent Labs  02/22/14 0947 02/24/14 0332 03/07/14 0648  PROBNP 2319.0* 2410.0* 437.5  CBG: No results for input(s): GLUCAP in the last 168 hours.  Time coordinating discharge: 35 minutes  Signed:  Jushua Waltman  [facility] 08/07/2014, 4:15 PM

## 2014-08-08 ENCOUNTER — Telehealth: Payer: Self-pay | Admitting: Pulmonary Disease

## 2014-08-08 LAB — CULTURE, BLOOD (ROUTINE X 2)
CULTURE: NO GROWTH
Culture: NO GROWTH

## 2014-08-08 NOTE — Telephone Encounter (Signed)
Spoke with the pt and scheduled appt with MW for 08/15/14 at 1:45 pm for HFU since BQ and TP with NO openings

## 2014-08-10 LAB — RESPIRATORY VIRUS PANEL

## 2014-08-11 ENCOUNTER — Telehealth: Payer: Self-pay | Admitting: Family Medicine

## 2014-08-11 NOTE — Telephone Encounter (Signed)
Larita FifeLynn @ [facility]  PT  22.0 inr  1.8  Her range is 1.8 to 2.2  Please advise

## 2014-08-12 NOTE — Telephone Encounter (Signed)
Continue current dose, repeat in 2 weeks.

## 2014-08-12 NOTE — Telephone Encounter (Signed)
[facility] to have Ms. [name] PillionMassey continue current dose and recheck her PT/INR in 2 weeks.

## 2014-08-15 ENCOUNTER — Ambulatory Visit: Payer: Medicare Other | Admitting: Internal Medicine

## 2014-08-16 DIAGNOSIS — L989 Disorder of the skin and subcutaneous tissue, unspecified: Secondary | ICD-10-CM | POA: Diagnosis not present

## 2014-08-16 DIAGNOSIS — I5032 Chronic diastolic (congestive) heart failure: Secondary | ICD-10-CM | POA: Diagnosis not present

## 2014-08-16 DIAGNOSIS — I4891 Unspecified atrial fibrillation: Secondary | ICD-10-CM | POA: Diagnosis not present

## 2014-08-16 DIAGNOSIS — I129 Hypertensive chronic kidney disease with stage 1 through stage 4 chronic kidney disease, or unspecified chronic kidney disease: Secondary | ICD-10-CM | POA: Diagnosis not present

## 2014-08-18 ENCOUNTER — Other Ambulatory Visit: Payer: Self-pay | Admitting: Family Medicine

## 2014-08-18 MED ORDER — GABAPENTIN 300 MG PO CAPS: 300.0000 mg | ORAL_CAPSULE | Freq: Every day | ORAL | Status: DC

## 2014-08-19 ENCOUNTER — Encounter: Payer: Self-pay | Admitting: Cardiovascular Disease

## 2014-08-19 ENCOUNTER — Ambulatory Visit (INDEPENDENT_AMBULATORY_CARE_PROVIDER_SITE_OTHER): Payer: Medicare Other | Admitting: Cardiovascular Disease

## 2014-08-19 VITALS — BP 144/78 | HR 87 | Ht 64.0 in | Wt 290.5 lb

## 2014-08-19 DIAGNOSIS — I10 Essential (primary) hypertension: Secondary | ICD-10-CM

## 2014-08-19 DIAGNOSIS — I480 Paroxysmal atrial fibrillation: Secondary | ICD-10-CM

## 2014-08-19 DIAGNOSIS — I5033 Acute on chronic diastolic (congestive) heart failure: Secondary | ICD-10-CM

## 2014-08-19 DIAGNOSIS — N179 Acute kidney failure, unspecified: Secondary | ICD-10-CM

## 2014-08-19 DIAGNOSIS — J849 Interstitial pulmonary disease, unspecified: Secondary | ICD-10-CM

## 2014-08-19 DIAGNOSIS — T81718A Complication of other artery following a procedure, not elsewhere classified, initial encounter: Secondary | ICD-10-CM

## 2014-08-19 DIAGNOSIS — I2699 Other pulmonary embolism without acute cor pulmonale: Secondary | ICD-10-CM | POA: Diagnosis not present

## 2014-08-19 MED ORDER — METOLAZONE 5 MG PO TABS: 5.0000 mg | ORAL_TABLET | Freq: Every day | ORAL | Status: DC | PRN

## 2014-08-19 MED ORDER — TORSEMIDE 20 MG PO TABS: 60.0000 mg | ORAL_TABLET | Freq: Two times a day (BID) | ORAL | Status: DC

## 2014-08-19 NOTE — Assessment & Plan Note (Signed)
Maintaining normal sinus rhythm. Diltiazem increased on recent hospital admission likely contributing to worsening leg edema on today's visit. Amiodarone stopped despite small dose of 100 mg. Previously has had difficulty maintaining normal sinus rhythm. Was on flecainide in the past. We'll monitor for now. If leg edema gets worse on the right with worsening skin breakdown, may need to decrease the diltiazem back to 120 mg daily

## 2014-08-19 NOTE — Assessment & Plan Note (Signed)
Mild weight gain since her hospital discharge. Previously was doing well on torsemide 60 mg twice a day. We will increase the dose. Suggested she monitor her weight. Recommended she take metolazone 5 mg 30 minutes before her a.m. torsemide very sparingly only for weight more than 295 pounds

## 2014-08-19 NOTE — Progress Notes (Signed)
Patient ID: [name] [name], female    DOB: 02/17/1932, 81 y.o.   MRN: [id number]  HPI Comments: 81 y/o woman with h/o morbid obesity, sleep apnea and obesity hypoventilation syndrome, atrial fib, previously on flecainide, DVT on the left and PE in 2007 on warfarin, chronic diastolic HF, chronic respiratory distress secondary to ILD/pulmonary fibrosis, on chronic prednisone, nonobstructive CAD by cath 8/10 and small AAA, history of chronic leg edema,  She presents for routine followup of her diastolic CHF after recent hospitalization approximately 2-3 weeks ago  On recent hospitalization, she was treated with aggressive diuresis with 5 L out using IV Lasix. She had climb in her creatinine and BUN, Discharged on torsemide 40 mg twice a day Since then she has had mild weight gain, continues to have leg swelling on the right, no significant change in the left lower extremity She reports having skin breakdown on the right with ulceration, blistering, oozing She has not been having her leg wrapped for some time. Symptoms worse now that her legs are hanging down, better in the hospital  She continues on prednisone which she attributes to her dramatic weight gain up from 260 pounds, now 290 pounds Weight at home 290 though was in high 280s at home on discharge from the hospital, possibly 3 pound weight gain She has follow-up with pulmonary next week  Dr.  Dema SeverinMungal, and the following week  with Dr. [name] initiated her prednisone treatment in the past Family who presents with her today wonders if the prednisone is helping anymore  EKG on today's visit shows normal sinus rhythm with rate 82 bpm, poor R-wave progression to the anterior precordial leads  Other past medical history  hospitalization 02/18/2014 with 2 weeks in the hospital, then 2 weeks in rehabilitation, discharge 03/21/2014. Initial echocardiogram on arrival to the hospital showed severe pulmonary hypertension. She had aggressive diuresis for  5 days then right heart catheterization showing essentially normal right heart pressures. She was discharged to rehabilitation with weight 255 pounds, down from 267 pounds per the patient. \On discharge from the rehabilitation, she was 281 pounds. She was not on a diuretic through the 2 weeks. She call our office after discharge with worsening lower extremity edema and shortness of breath. She was started on torsemide 40 g twice a day. Weight is now 263 pounds at home. Leg edema has resolved, she feels well overall with minimal shortness of breath. She states that weight has been stable on this torsemide 40 mg twice a day dosing. Recent basic metabolic panel reviewed with her showing potassium 4.9, essentially normal renal function  Chronic wound healing issues of her right lower extremity .   Other past medical history  in the hospital 08/31/2013. heart failure symptoms, had aggressive diuresis in the hospital with 15 pound weight drop.  Over the past several months her creatinine has slowly climbed from her baseline 1.2, up to 1.6, 1.8   Previous episode of syncope/loss of consciousness in February 2014 on Valentine's Day. Creatinine at that time was 1.7 .She was given IV fluids. secondary to a huge hematoma of her right leg, she has had months of wound evaluation. Not a candidate for skin grafting. She has a leg wrap on the right. No swelling of her left lower extremity, only the right. Region of skin loss on the right is extensive. discharge 07/27/2012   hospitalization 07/14/2013 for epistaxis. INR was 3.7. She had her nose packed and cauterized. Cardiology evaluated the patient on 06/17/2013. Flecainide  and diltiazem was continued. She was discharged on 06/18/2013 Lab work in the hospital 06/16/2013 showed creatinine 1.84, BUN 60, potassium 3.1  Echocardiogram in the hospital 06/27/2012 was essentially normal, mildly dilated left atrium Echocardiogram October 2015 with normal ejection  fraction, severe pulmonary hypertension   No Known Allergies  Outpatient Encounter Prescriptions as of 08/19/2014  Medication Sig  . allopurinol (ZYLOPRIM) 300 MG tablet Take 1 tablet (300 mg total) by mouth daily.  [name] aspirin 81 MG tablet Take 81 mg by mouth daily.  [name] atorvastatin (LIPITOR) 10 MG tablet Take 1 tablet (10 mg total) by mouth daily.  . budesonide (PULMICORT) 0.5 MG/2ML nebulizer solution Take 2 mLs (0.5 mg total) by nebulization 2 (two) times daily.  . cetirizine (ZYRTEC ALLERGY) 10 MG tablet Take 10 mg by mouth as needed for allergies.   [name] colchicine 0.6 MG tablet Take 0.6 mg by mouth as needed (for flareup).   [name] diltiazem (CARDIZEM CD) 240 MG 24 hr capsule Take 1 capsule (240 mg total) by mouth daily.  [name] escitalopram (LEXAPRO) 20 MG tablet Take 1 tablet (20 mg total) by mouth daily.  . famotidine (PEPCID) 20 MG tablet Take 1 tablet (20 mg total) by mouth daily.  . ferrous sulfate 325 (65 FE) MG tablet Take 1 tablet (325 mg total) by mouth 2 (two) times daily with a meal.  . gabapentin (NEURONTIN) 300 MG capsule Take 1 capsule (300 mg total) by mouth daily.  [name] levalbuterol (XOPENEX) 0.63 MG/3ML nebulizer solution Take 3 mLs (0.63 mg total) by nebulization 4 (four) times daily.  [name] OVER THE COUNTER MEDICATION Inhale 1 application into the lungs at bedtime. USES BIPAP EVERY BEDTIME FOR SLEEP  . potassium chloride SA (K-DUR,KLOR-CON) 20 MEQ tablet Take 20 mEq by mouth daily.  . predniSONE (DELTASONE) 20 MG tablet Take 2 tablets (40 mg total) by mouth daily with breakfast.  . torsemide (DEMADEX) 20 MG tablet Take 3 tablets (60 mg total) by mouth 2 (two) times daily.  [name] warfarin (COUMADIN) 2.5 MG tablet Take as directed. (Patient taking differently: Take 2.5-3.75 mg by mouth daily at 6 PM. Takes 1.5 tabs on Tues, Thurs and Sun Takes 1 tab all other days)  . [DISCONTINUED] torsemide (DEMADEX) 20 MG tablet Take 2 tablets (40 mg total) by mouth 2 (two) times daily.  . metolazone  (ZAROXOLYN) 5 MG tablet Take 1 tablet (5 mg total) by mouth daily as needed.    Past Medical History  Diagnosis Date  . Congestive heart failure, unspecified     a. ECHO 6/0: EF 55-60%, mild LVH, mildly dilated RV w. mildly dec fx, RVSP 67  . Chronic atrial fibrillation     failed cardioversion in past  -repeat DC-CV on October 5th, 2010  . Personal history of DVT (deep vein thrombosis) 2008  . Obstructive sleep apnea   . HTN (hypertension)   . Morbid obesity   . Shingles     with postherpetic neuraigia  . GERD (gastroesophageal reflux disease)   . Hyperlipidemia   . Meningitis 1950s  . Gallstones     s/p cholecystectomy  . CAD (coronary artery disease)     nonobstructive by cath 8/10 - LAD 40-50% w mild PAH mean 29 w PVR 3.2 [place]  . AAA (abdominal aortic aneurysm)     small  . PE (pulmonary embolism)     history of  . Anemia   . Shortness of breath dyspnea   . Chronic kidney disease  ckd stage 2  . Lung interstitial disease   . Hypoxemia     Past Surgical History  Procedure Laterality Date  . Appendectomy  1947  . Tonsillectomy  1946  . Hospitalized with meningitis at [facility]  1960  . Cholecystectomy    . Cardioversion  01/2007    x2  . Right heart catheterization N/A 02/28/2014    Procedure: RIGHT HEART CATH;  Surgeon: Laurey Morale, MD;  Location: [facility] CATH LAB;  Service: Cardiovascular;  Laterality: N/A;    Social History  reports that she has never smoked. She has never used smokeless tobacco. She reports that she drinks alcohol. She reports that she does not use illicit drugs.  Family History family history includes Depression in her mother; Heart disease in her paternal grandfather; Heart failure in her mother; Obesity in her other; Ovarian cancer in her paternal grandmother.   Review of Systems  Constitutional: Negative.   Respiratory: Positive for shortness of breath.   Cardiovascular: Negative.   Gastrointestinal: Negative.   Musculoskeletal:  Positive for gait problem.  Skin: Positive for wound.       Right lower extremity, bandage in place  Neurological: Negative.   Hematological: Negative.   Psychiatric/Behavioral: Negative.   All other systems reviewed and are negative.   BP 144/78 mmHg  Pulse 87  Ht  (1.626 m)  Wt 290 lb 8 oz (131.77 kg)  BMI 49.84 kg/m2  Physical Exam  Constitutional: She is oriented to person, place, and time. She appears well-developed and well-nourished.  Obese, presenting in a wheelchair  HENT:  Head: Normocephalic.  Nose: Nose normal.  Mouth/Throat: Oropharynx is clear and moist.  Eyes: Conjunctivae are normal. Pupils are equal, round, and reactive to light.  Neck: Normal range of motion. Neck supple. No JVD present.  Cardiovascular: Normal rate, regular rhythm, S1 normal, S2 normal, normal heart sounds and intact distal pulses.  Exam reveals no gallop and no friction rub.   No murmur heard. Minimal leg edema on the left lower extremity, trace pitting edema right lower extremity ankle, several blisters right shin, side of wound healing, small ulceration noted right shin, less than size of a quarter  Pulmonary/Chest: Effort normal. No respiratory distress. She has no wheezes. She has rales. She exhibits no tenderness.  Rales one half way up lung fields bilaterally  Abdominal: Soft. Bowel sounds are normal. She exhibits no distension. There is no tenderness.  Musculoskeletal: Normal range of motion. She exhibits edema. She exhibits no tenderness.  Lymphadenopathy:    She has no cervical adenopathy.  Neurological: She is alert and oriented to person, place, and time. Coordination normal.  Skin: Skin is warm and dry. No rash noted. No erythema.  Psychiatric: She has a normal mood and affect. Her behavior is normal. Judgment and thought content normal.    Assessment and Plan  Nursing note and vitals reviewed.

## 2014-08-19 NOTE — Assessment & Plan Note (Signed)
High risk of DVT. Currently on warfarin

## 2014-08-19 NOTE — Assessment & Plan Note (Signed)
Diltiazem dose increased in the hospital last month. If leg edema continues to get worse with skin breakdown as she has today, we'll need to decrease the dose active 120 mg daily

## 2014-08-19 NOTE — Assessment & Plan Note (Signed)
Renal dysfunction likely related to aggressive diuretic use. She seems to have less respiratory distress that she is on the "dry side". We'll continue to be aggressive with her diuresis for the benefit of her respiratory distress and leg edema We'll need close monitoring of her renal function

## 2014-08-19 NOTE — Assessment & Plan Note (Signed)
Followed by pulmonary. She has 2 appointments in the next month. Currently on chronic steroids

## 2014-08-19 NOTE — Patient Instructions (Addendum)
You are doing well.  Please increase the torsemide up to 60 mg twice a day  For weight >295,  Take metolazone in the AM,  30 minutes later, take the torsemide 60 mg  Talk with pulmonary about changing to albuterol (from xopenex, for cost)  Please call us if you have new issues that need to be addressed before your next appt.  Your physician wants you to follow-up in: 3 months.  You will receive a reminder letter in the mail two months in advance. If you don't receive a letter, please call our office to schedule the follow-up appointment.

## 2014-08-23 ENCOUNTER — Telehealth: Payer: Self-pay | Admitting: *Deleted

## 2014-08-23 ENCOUNTER — Encounter: Payer: Self-pay | Admitting: Internal Medicine

## 2014-08-23 ENCOUNTER — Ambulatory Visit (INDEPENDENT_AMBULATORY_CARE_PROVIDER_SITE_OTHER): Payer: Medicare Other | Admitting: Internal Medicine

## 2014-08-23 VITALS — BP 122/76 | HR 80 | Temp 98.0°F | Ht 64.0 in | Wt 284.0 lb

## 2014-08-23 DIAGNOSIS — J9611 Chronic respiratory failure with hypoxia: Secondary | ICD-10-CM | POA: Diagnosis not present

## 2014-08-23 DIAGNOSIS — J849 Interstitial pulmonary disease, unspecified: Secondary | ICD-10-CM | POA: Diagnosis not present

## 2014-08-23 NOTE — Progress Notes (Signed)
MRN# [id number] [name] [name] 01/30/1932   CC: Chief Complaint  Patient presents with  . Acute Visit    BQ pt c/o sob, sl.wheezing. She uses nebulizer three times per day. She was discharged from hospital on 08/07/14. She has been on Prednisone regimen and now up to 40 mg daily. Dr. Mariah Milling would like for pulmonary to look at cost of nebulizer medication/cost.      Brief History: [name] [name] is a 81 year old female who has a past medical history significant for congestive heart failure, pulmonary hypertension, pulmonary embolism, atrial fibrillation and obstructive sleep apnea. She previously been seen by Dr. Coralyn Helling for management of sleep apnea. She uses CPAP at 7 a 10 cm of water every night. At baseline she is on 2.5 L of oxygen continuously. 12/2012 CXR > Left lung scarring vs pneumonia  10/19 RHC >RA mean 3 RV 37/6 PA 37/15, mean 23 PCWP mean 7 Oxygen saturations: PA 75% AO 92% Cardiac Output(Fick) 8.94 Cardiac Index (Fick) 4.14 Cardiac Output (Thermo) 5.05 Cardiac Index (Thermo) 2.34  02/2014 CT chest high res> mild pulm edema, very mild scattered subpleural reticulation, but assessment limited by likely pulmonary edema; 4mm nodule 04/04/2014 CT high res [place]> patchy ground glass throughout with some subpleural reticulation, suspicious for NSIP, not UIP; mild air trapping, mild cardiomegally and atheroscloerosis  07/18/3014 ROV> [name] [name] has been welling more and she thinks that [name] prednisone has caused a "film" to form over her eyes. She has gained twenty pounds. Her BMET recently suggested that she was volume depleted. She says that when she took [name] prednisone 40mg  daily she it helped her breathing better. She says that she has been more fatigued and short of breath recently. She had blood work done recently. She has not been coughing more recently. She continues to use and benefit from her oxygen.  Events since last clinic visit: Patient presents today for a hospital visit.  See  below for full details. Currently on prednisone 40mg  with good control of her symptoms.   Now s\p recent viral illness and hospitalization. Patient recently saw Cardiology, diuretics adjusted.  Today patient states that her breathing is almost back to her baseline.    Recent hospitalization at [facility] - 3/21-3/27 BRIEF PATIENT DESCRIPTION:  Seen by Dr. Molli Knock on 08/07/14  This is a 81 year old female w/ chronic respiratory failure in [name] setting of ILD (w/ radiographic findings most c/w NSIP) ; ANA pos, SSA positive with hx of dry mouth. She was never biopsied due to concern that she would not tolerate procedure. Her respiratory status is further complicated by diastolic heart failure and secondary PAH. Based on initial evaluation back in Jan 2016 she was placed on a prednisone trial (starting at 40 mg daily for 2 weeks). She initially felt some improvement with this therapy. On her best day she felt that she could ambulate close to 100 ft on 3.5 liters while on 40 of pred. Prior to that 10 feet would have been a challenge. However these benefits were short lived and outweighed by [name] side effects which included: weight gain (adipose tissue> water w/ 20 lb gain), fatigue, weeping form her legs and worsening dyspnea -->not ever as bad as before [name] pred. Based on this it was decided on her March 7 visit to attempt to wean [name] steroids to off. On 3/18 she had what sounded like a viral illness w/ 3 episodes of projectile vomiting; and low grade fever. She notes that following this  her symptom burden really started to decline. On On 3/21 our office was called by [name] home health nurse stating her resting pulse ox on 3 liters was 69%, this increased to 82% on 5 liters. She notes that her dyspnea really began to take a turn for [name] worse after her decrease from 20 mg. We recommended that she be seen. She was admitted on 3/21 w/ multifactorial respiratory failure. She was Placed on IV lasix, empiric  antibiotics and IV solu-cortef. We were asked to see and assist w/ management of this very complicated case.    SIGNIFICANT EVENTS  1/5: initial consult w/ Mcquaid. Started on Pred trial 40 mg for 2 weeks., then 30 mg x 1 month.  2/8 HHN calling our office. Reporting weight gain from 1/20 starting 267-->274; diuretic increased by her cardiologist.  2/24 seen in f/u by cards. Wt increased to 280 lbs. Blood work suggesting some volume depletion. Wt gain attributed to increased adipose tissue 3/7 McQuaid visit. Wt now 285. Decided to wean off pred as side effects now worsening and dyspnea worsening.  3/18 low gd fever, projectile vomiting. Following this much worsening SOB 3/21: calling our office reporting worsening SOB 08/01/2014 - admit 08/02/14: PCCM cnsult 08/03/14: still on 15L [place].   ASSESSMENT / PLAN:  Acute on Chronic Hypoxic Respiratory failure ILD (presumed NSIP by CT scan) OSA on CPAP Secondary PAH Decompensated diastolic HF Decompensated Cor pulmonale  Probable OHS w/ substantial weight gain and worsening deconditioning  Af (currently NSR but on amiodarone) Recent viral illness  Obesity HTN  Prior PE/DVT  Multi-factorial Acute on Chronic respiratory failure on [name] basis of what is likely some combination of decompensated diastolic HF,cor pulmonale, obesity hypoventilation, and deconditioning super-imposed on underlying ILD (presumed NSIP v Autoimmune ILD by CT scan) v Amio Tox  Patient feels that she is at baseline, she has an O2 sat monitor at home and sats are as above, she is doing [name] same here.  Recs Hold further diureses (on home dose of turosamide). Titrate O2 down to 3.5-4 liters at home flow rate. D/C solumedrol Continue prednisone 40 mg PO and discharge on that until she is seen by Dr. Kendrick Fries as outpatient. Arrange close f/u with PCCM (preferably within a week). DC amio since 08/03/14 D/C Abx since PCT is normal Cont anticoagulation  Hold  off on bronch BAL CPAP while asleep   PMHX:   Past Medical History  Diagnosis Date  . Congestive heart failure, unspecified     a. ECHO 6/0: EF 55-60%, mild LVH, mildly dilated RV w. mildly dec fx, RVSP 67  . Chronic atrial fibrillation     failed cardioversion in past  -repeat DC-CV on October 5th, 2010  . Personal history of DVT (deep vein thrombosis) 2008  . Obstructive sleep apnea   . HTN (hypertension)   . Morbid obesity   . Shingles     with postherpetic neuraigia  . GERD (gastroesophageal reflux disease)   . Hyperlipidemia   . Meningitis 1950s  . Gallstones     s/p cholecystectomy  . CAD (coronary artery disease)     nonobstructive by cath 8/10 - LAD 40-50% w mild PAH mean 29 w PVR 3.2 [place]  . AAA (abdominal aortic aneurysm)     small  . PE (pulmonary embolism)     history of  . Anemia   . Shortness of breath dyspnea   . Chronic kidney disease     ckd stage 2  . Lung interstitial  disease   . Hypoxemia    Surgical Hx:  Past Surgical History  Procedure Laterality Date  . Appendectomy  1947  . Tonsillectomy  1946  . Hospitalized with meningitis at [place] [facility]  1960  . Cholecystectomy    . Cardioversion  01/2007    x2  . Right heart catheterization N/A 02/28/2014    Procedure: RIGHT HEART CATH;  Surgeon: Laurey Morale, MD;  Location: [facility] CATH LAB;  Service: Cardiovascular;  Laterality: N/A;   Family Hx:  Family History  Problem Relation Age of Onset  . Heart failure Mother   . Depression Mother   . Ovarian cancer Paternal Grandmother   . Heart disease Paternal Grandfather   . Obesity Other    Social Hx:   History  Substance Use Topics  . Smoking status: Never Smoker   . Smokeless tobacco: Never Used  . Alcohol Use: Yes     Comment: occasional   Medication:   Current Outpatient Rx  Name  Route  Sig  Dispense  Refill  . allopurinol (ZYLOPRIM) 300 MG tablet   Oral   Take 1 tablet (300 mg total) by mouth daily.   30 tablet   11   . aspirin 81  MG tablet   Oral   Take 81 mg by mouth daily.         [name] atorvastatin (LIPITOR) 10 MG tablet   Oral   Take 1 tablet (10 mg total) by mouth daily.   30 tablet   5   . budesonide (PULMICORT) 0.5 MG/2ML nebulizer solution   Nebulization   Take 2 mLs (0.5 mg total) by nebulization 2 (two) times daily.   2 mL   12   . cetirizine (ZYRTEC ALLERGY) 10 MG tablet   Oral   Take 10 mg by mouth as needed for allergies.          [name] colchicine 0.6 MG tablet   Oral   Take 0.6 mg by mouth as needed (for flareup).          [name] diltiazem (CARDIZEM CD) 240 MG 24 hr capsule   Oral   Take 1 capsule (240 mg total) by mouth daily.   30 capsule   0   . escitalopram (LEXAPRO) 20 MG tablet   Oral   Take 1 tablet (20 mg total) by mouth daily.   30 tablet   5   . famotidine (PEPCID) 20 MG tablet   Oral   Take 1 tablet (20 mg total) by mouth daily.   30 tablet   6   . ferrous sulfate 325 (65 FE) MG tablet   Oral   Take 1 tablet (325 mg total) by mouth 2 (two) times daily with a meal.   60 tablet   0   . gabapentin (NEURONTIN) 300 MG capsule   Oral   Take 1 capsule (300 mg total) by mouth daily.   90 capsule   0   . levalbuterol (XOPENEX) 0.63 MG/3ML nebulizer solution   Nebulization   Take 3 mLs (0.63 mg total) by nebulization 4 (four) times daily.   3 mL   12   . metolazone (ZAROXOLYN) 5 MG tablet   Oral   Take 1 tablet (5 mg total) by mouth daily as needed.   30 tablet   1   . OVER [name] COUNTER MEDICATION   Inhalation   Inhale 1 application into [name] lungs at bedtime. USES BIPAP EVERY BEDTIME FOR SLEEP         .  potassium chloride SA (K-DUR,KLOR-CON) 20 MEQ tablet   Oral   Take 20 mEq by mouth daily.         . predniSONE (DELTASONE) 20 MG tablet   Oral   Take 2 tablets (40 mg total) by mouth daily with breakfast.   60 tablet   0   . torsemide (DEMADEX) 20 MG tablet   Oral   Take 3 tablets (60 mg total) by mouth 2 (two) times daily.   180 tablet   6   .  warfarin (COUMADIN) 2.5 MG tablet      Take as directed. Patient taking differently: Take 2.5-3.75 mg by mouth daily at 6 PM. Takes 1.5 tabs on Tues, Thurs and Sun Takes 1 tab all other days   90 tablet   0      Review of Systems: Gen:  Denies  fever, sweats, chills HEENT: Denies blurred vision, double vision, ear pain, eye pain, hearing loss, nose bleeds, sore throat Cvc:  No dizziness, chest pain or heaviness Resp:   Chronic sob (O2 dependent), mild cough, still with mild wheeze Gi: Denies swallowing difficulty, stomach pain, nausea or vomiting, diarrhea, constipation, bowel incontinence Gu:  Denies bladder incontinence, burning urine Ext:   No Joint pain, stiffness or swelling Skin: Right leg swelling and mild weeping skin Endoc:  No polyuria, polydipsia , polyphagia or weight change Psych: No depression, insomnia or hallucinations  Other:  All other systems negative  Allergies:  Review of patient's allergies indicates no known allergies.  Physical Examination:  VS: BP 122/76 mmHg  Pulse 80  Temp(Src) 98 F (36.7 C) (Oral)  Ht 5\' 4"  (1.626 m)  Wt 284 lb (128.822 kg)  BMI 48.72 kg/m2  SpO2 91%  General Appearance: No distress  HEENT: PERRLA, EOM intact, no ptosis, no other lesions noticed Pulmonary:Exam: coarse upper airway sounds, fine dry crackles from mid lung to base.  Cardiovascular:@ Exam:  Normal S1,S2.  No m/r/g.     Abdomen:Exam: Benign, Soft, non-tender, No masses  Skin:   warm, no rashes, no ecchymosis  Extremities: normal, no cyanosis, clubbing. RLE with mild erythema, mild skin molting, +2 edema to knee Labs results:  BMP Lab Results  Component Value Date   NA 140 08/06/2014   K 4.1 08/06/2014   CL 101 08/06/2014   CO2 32 08/06/2014   GLUCOSE 137* 08/06/2014   BUN 39* 08/06/2014   CREATININE 1.35* 08/06/2014     CBC CBC Latest Ref Rng 08/06/2014 08/04/2014 08/04/2014  WBC 4.0 - 10.5 K/uL 14.1(H) 15.2(H) -  Hemoglobin 12.0 - 15.0 g/dL 11.2(L) 12.1  -  Hematocrit 36.0 - 46.0 % 36.2 39.8 38.2  Platelets 150 - 400 K/uL 297 292 -     Rad results: ([name] following images and results were reviewed by Dr. Dema SeverinMungal). Ct Chest (HRCT ) 08/03/14 Mediastinum/Nodes: Mediastinal lymph nodes measure up to 1.3 cm in short axis in AP window (previously 1.0 cm). Hilar regions are difficult to definitively evaluate without IV contrast. No axillary adenopathy. Atherosclerotic calcification of [name] arterial vasculature, including coronary arteries. Pulmonary arteries and heart are enlarged. No pericardial effusion.  Lungs/Pleura: There are patchy areas of ground-glass with interstitial thickening, coarsening, mild architectural distortion and subpleural reticulation, without a definite zonal predominance. Dependent atelectasis and/or scarring in [name] right hemi thorax, similar to 03/01/2014. Probable tiny left pleural effusion.  Upper abdomen: Visualized portions of [name] liver, gallbladder and right adrenal gland are unremarkable. A 1.9 cm nodule in [name] left adrenal gland is  only partially imaged. Visualized portions of [name] spleen and stomach are grossly unremarkable. No upper abdominal adenopathy.  Musculoskeletal: No worrisome lytic or sclerotic lesions. Degenerative changes are seen in [name] spine.  IMPRESSION: 1. Pulmonary parenchymal pattern of patchy ground-glass, mild interstitial thickening, coarsening, mild architectural distortion and subpleural reticulation, without a definite zonal predominance, suspicious for pulmonary edema or atypical/viral pneumonia superimposed on interstitial lung disease. Evaluation of underlying interstitial lung disease, and assessment of progression, are difficult given [name] suspected superimposed acute findings. It can be said, however, that [name] findings are not consistent with usual interstitial pneumonitis (UIP) and may be due to underlying postinflammatory fibrosis. 2. Tiny left pleural effusion. 3.  Enlarged pulmonary arteries, indicative of pulmonary arterial hypertension. 4. Slight enlargement of mediastinal lymph nodes, likely reactive. 5. Coronary artery calcification. 6. Left adrenal nodule, incompletely imaged.     Assessment and Plan: ILD (interstitial lung disease) Recent hospitalization with resuming high dose steroids, currently on 40mg .   We will start weaning prednisone again, and taper slowly.   Using a steroid sparing agent is not a good idea considering their side effect profile when we don't know exactly what type of pulmonary pathology is afflicting her. She would not be able to tolerate an open lung biopsy.  While usual interstitial pneumonitis would be [name] most common etiology in her demographic, [name] CT scan did not seem consistent with this. At her last visit Dr. Kendrick Fries, did briefly discuss [name] use of [name] new anti-fibrotic agents but she was not interested in these because of their side effect profiles and they would not make her feel better, he will probably readdress this is she cannot be weaned of steroids.   Plan: -Prednisone 30mg  until follow with Dr. Kendrick Fries on 4/29 -Continue oxygen as written -Continue nebulized steroids in [name] form of Pulmicort twice a day        Updated Medication List Outpatient Encounter Prescriptions as of 08/23/2014  Medication Sig  . allopurinol (ZYLOPRIM) 300 MG tablet Take 1 tablet (300 mg total) by mouth daily.  [name] aspirin 81 MG tablet Take 81 mg by mouth daily.  [name] atorvastatin (LIPITOR) 10 MG tablet Take 1 tablet (10 mg total) by mouth daily.  . budesonide (PULMICORT) 0.5 MG/2ML nebulizer solution Take 2 mLs (0.5 mg total) by nebulization 2 (two) times daily.  . cetirizine (ZYRTEC ALLERGY) 10 MG tablet Take 10 mg by mouth as needed for allergies.   [name] colchicine 0.6 MG tablet Take 0.6 mg by mouth as needed (for flareup).   [name] diltiazem (CARDIZEM CD) 240 MG 24 hr capsule Take 1 capsule (240 mg total) by mouth daily.  [name]  escitalopram (LEXAPRO) 20 MG tablet Take 1 tablet (20 mg total) by mouth daily.  . famotidine (PEPCID) 20 MG tablet Take 1 tablet (20 mg total) by mouth daily.  . ferrous sulfate 325 (65 FE) MG tablet Take 1 tablet (325 mg total) by mouth 2 (two) times daily with a meal.  . gabapentin (NEURONTIN) 300 MG capsule Take 1 capsule (300 mg total) by mouth daily.  [name] levalbuterol (XOPENEX) 0.63 MG/3ML nebulizer solution Take 3 mLs (0.63 mg total) by nebulization 4 (four) times daily.  . metolazone (ZAROXOLYN) 5 MG tablet Take 1 tablet (5 mg total) by mouth daily as needed.  [name] OVER [name] COUNTER MEDICATION Inhale 1 application into [name] lungs at bedtime. USES BIPAP EVERY BEDTIME FOR SLEEP  . potassium chloride SA (K-DUR,KLOR-CON) 20 MEQ tablet Take 20 mEq by mouth daily.  [name]  predniSONE (DELTASONE) 20 MG tablet Take 2 tablets (40 mg total) by mouth daily with breakfast.  . torsemide (DEMADEX) 20 MG tablet Take 3 tablets (60 mg total) by mouth 2 (two) times daily.  [name] warfarin (COUMADIN) 2.5 MG tablet Take as directed. (Patient taking differently: Take 2.5-3.75 mg by mouth daily at 6 PM. Takes 1.5 tabs on Tues, Thurs and Sun Takes 1 tab all other days)    Orders for this visit: No orders of [name] defined types were placed in this encounter.    Thank  you for [name] visitation and for allowing  [facility] to assist in [name] care of your patient. Our recommendations are noted above.  Please contact us if we can be of further service.  Stephanie Acre, MD [facility] Office Number: [phone]

## 2014-08-23 NOTE — Assessment & Plan Note (Addendum)
Recent hospitalization with resuming high dose steroids, currently on 40mg .   We will start weaning prednisone again, and taper slowly.   Using a steroid sparing agent is not a good idea considering their side effect profile when we don't know exactly what type of pulmonary pathology is afflicting her. She would not be able to tolerate an open lung biopsy.  While usual interstitial pneumonitis would be the most common etiology in her demographic, the CT scan did not seem consistent with this. At her last visit Dr. [name], did briefly discuss the use of the new anti-fibrotic agents but she was not interested in these because of their side effect profiles and they would not make her feel better, he will probably readdress this is she cannot be weaned of steroids.   Plan: -Prednisone 30mg  until follow with Dr. [name] on 4/29 -Continue oxygen as written -Continue nebulized steroids in the form of Pulmicort twice a day

## 2014-08-23 NOTE — Telephone Encounter (Signed)
Dr. [name] this is the order for [name] [name]. She is requesting new order for oxygen as the tank she has at home runs out quickly. [name] [name] [name] and he says this is how the order should read. The patient's [facility] and was told that they need the doctor to send a new order.    DME: [name] Route (nasal cannula OR mask): nasal Liter Flow:5L also bleed 02 into Bipap Frequency (continuous with stationary and portable oxygen unit needed OR only at night): Continuous RT Eval Best Portable System Oxygen Conserving Device (Yes or No): Yes Type of Oxygen (Gas or Liquid): Gas

## 2014-08-23 NOTE — Patient Instructions (Addendum)
Follow up with Dr. [name] on 4/29 -Prednisone 30mg  until follow with Dr. [name] on 4/29 -Continue oxygen as written -Continue nebulized steroids in the form of Pulmicort twice a day -Dr. [name] will discuss changing your nebulizer if possible to something more financially feasible.

## 2014-08-24 ENCOUNTER — Other Ambulatory Visit: Payer: Self-pay | Admitting: *Deleted

## 2014-08-24 DIAGNOSIS — J9612 Chronic respiratory failure with hypercapnia: Secondary | ICD-10-CM

## 2014-08-24 DIAGNOSIS — R0602 Shortness of breath: Secondary | ICD-10-CM

## 2014-08-24 NOTE — Telephone Encounter (Signed)
I am OK with this, please order

## 2014-08-24 NOTE — Telephone Encounter (Signed)
Order placed verbatim.  Nothing further needed.

## 2014-08-26 ENCOUNTER — Ambulatory Visit: Payer: Medicare Other

## 2014-08-30 ENCOUNTER — Encounter: Payer: Self-pay | Admitting: Pulmonary Disease

## 2014-09-01 ENCOUNTER — Telehealth: Payer: Self-pay | Admitting: *Deleted

## 2014-09-01 NOTE — Telephone Encounter (Signed)
Larita FifeLynn with [facility] called. INR was 2.7 and PT was 32.5. No abx or missed doses. No abnormal bleeding or bruising. Has not eaten quite as much spinach as usual. Call back # [phone]

## 2014-09-01 NOTE — Telephone Encounter (Signed)
Continue current dose and recheck in 2 weeks.

## 2014-09-02 NOTE — Consult Note (Signed)
General Aspect Primary cardiologist: Dr. Rockey Situ.   81 y/o woman with h/o morbid obesity, sleep apnea and obesity hypoventilation syndrome, atrial fib maintaining NSR on flecainide, DVT on the left and PE in 8295, diastolic HF on chronic diuretics, nonobstructive CAD by cath 8/10 and small AAA, chronic shortness of breath, edema.       She has been maintaining in NSR with Flecainide. She presented with 3 syncopal episodes that happened around midnight. She stood to go to bed and had a LOC. She had minor injures the first time but more significant injuries after the second episode. No chest pain, dyspnea or palpitations. The patient at triage was noted to be significantly hypotensive. She was also noted to have a significant right leg hematoma and also a large pulmonary contusion. She was noted to be coagulopathic and in acute renal failure.   Physical Exam:  GEN no acute distress   HEENT PERRL   NECK supple    RESP normal resp effort  clear BS    CARD Regular rate and rhythm  No murmur    ABD denies tenderness    LYMPH negative neck   EXTR positive edema, Large right leg hematoma   SKIN positive rashes, stasis dermatitis   PSYCH alert, A+O to time, place, person   Review of Systems:  Subjective/Chief Complaint Syncope   General: Fatigue    Skin: No Complaints    ENT: No Complaints    Eyes: No Complaints    Neck: No Complaints    Respiratory: Short of breath    Cardiovascular: Dyspnea    Gastrointestinal: No Complaints    Genitourinary: No Complaints    Vascular: No Complaints    Neurologic: Fainting    Hematologic: No Complaints    Endocrine: No Complaints    Home Medications:  Medication Instructions Status  flecainide 100 mg oral tablet 1  orally 2 times a day  Active  Cardizem CD 120 mg/24 hours oral capsule, extended release 1 cap(s) orally once a day  Active  Kdur 20 meq  PO 2 times a day  Active  Coumadin 5 mg tablet 1 tab(s) orally once a day at  5:00 pm Active  C-pap at night  Active  Ascriptin Enteric 81 mg enteric coated tablet 1 tab(s) orally once a day x 30 days  Active  allopurinol 100 mg oral tablet  orally once a day Active  venlafaxine extended release 150 mg oral tablet, extended release 1 tab(s) orally once a day Active  torsemide 20 mg oral tablet 1 tab(s) orally once a day Active  metolazone 2.5 mg oral tablet 1 tab(s) orally once a day Active  Zyrtec 10 mg oral tablet 1 tab(s) orally once a day Active  OxyContin 10 mg oral tablet, extended release 1 tab(s) orally every 12 hours Active   Lab Results:  Hepatic:  14-Feb-14 04:22   Bilirubin, Total 0.3  Alkaline Phosphatase 124  SGPT (ALT) 21  SGOT (AST) 22  Total Protein, Serum 7.4  Albumin, Serum  2.8  Routine BB:  14-Feb-14 04:22   ABO Group + Rh Type A Negative  Result(s) reported on 26 Jun 2012 at 02:03PM.  Fresh Frozen Plasma Unit 1 Issued  Fresh Frozen Plasma Unit 2 Ready (Result(s) reported on 26 Jun 2012 at 02:57PM.)  Routine Chem:  14-Feb-14 04:22   Glucose, Serum  122  BUN  23  Creatinine (comp)  1.74  Sodium, Serum 136  Potassium, Serum  3.1  Chloride, Serum  96  CO2, Serum 27  Calcium (Total), Serum 8.5  Osmolality (calc) 277  eGFR (African American)  32  eGFR (Non-African American)  28 (eGFR values <18m/min/1.73 m2 may be an indication of chronic kidney disease (CKD). Calculated eGFR is useful in patients with stable renal function. The eGFR calculation will not be reliable in acutely ill patients when serum creatinine is changing rapidly. It is not useful in  patients on dialysis. The eGFR calculation may not be applicable to patients at the low and high extremes of body sizes, pregnant women, and vegetarians.)  Anion Gap 13  Result Comment INR - NOTIFIED OF CRITICAL VALUE  - RESULTS VERIFIED BY REPEAT TESTING.  - CALLED TO CATHERINE ALEXANDER:06/26/12 AT  - 0510..Marland KitchenMarland KitchenPL  - READ-BACK PROCESS PERFORMED.  Result(s) reported on 26 Jun 2012 at 05:05AM.  Cardiac:  14-Feb-14 04:22   Troponin I < 0.02 (0.00-0.05 0.05 ng/mL or less: NEGATIVE  Repeat testing in 3-6 hrs  if clinically indicated. >0.05 ng/mL: POTENTIAL  MYOCARDIAL INJURY. Repeat  testing in 3-6 hrs if  clinically indicated. NOTE: An increase or decrease  of 30% or more on serial  testing suggests a  clinically important change)  CK, Total 100  CPK-MB, Serum  3.8 (Result(s) reported on 26 Jun 2012 at 05:09AM.)    14:51   Troponin I < 0.02 (0.00-0.05 0.05 ng/mL or less: NEGATIVE  Repeat testing in 3-6 hrs  if clinically indicated. >0.05 ng/mL: POTENTIAL  MYOCARDIAL INJURY. Repeat  testing in 3-6 hrs if  clinically indicated. NOTE: An increase or decrease  of 30% or more on serial  testing suggests a  clinically important change)  Routine UA:  14-Feb-14 14:21   Color (UA) Yellow  Clarity (UA) Clear  Glucose (UA) Negative  Bilirubin (UA) Negative  Ketones (UA) Negative  Specific Gravity (UA) 1.010  Blood (UA) Negative  pH (UA) 7.0  Protein (UA) 25 mg/dL  Nitrite (UA) Negative  Leukocyte Esterase (UA) Negative (Result(s) reported on 26 Jun 2012 at 03:01PM.)  RBC (UA) NONE SEEN  WBC (UA) 1 /HPF  Bacteria (UA) NONE SEEN  Epithelial Cells (UA) <1 /HPF  Mucous (UA) PRESENT  Hyaline Cast (UA) 5 /LPF (Result(s) reported on 26 Jun 2012 at 03:01PM.)  Routine Coag:  14-Feb-14 04:22   Prothrombin  41.8  INR  4.4 (INR reference interval applies to patients on anticoagulant therapy. A single INR therapeutic range for coumarins is not optimal for all indications; however, the suggested range for most indications is 2.0 - 3.0. Exceptions to the INR Reference Range may include: Prosthetic heart valves, acute myocardial infarction, prevention of myocardial infarction, and combinations of aspirin and anticoagulant. The need for a higher or lower target INR must be assessed individually. Reference: The Pharmacology and Management of the Vitamin K   antagonists: the seventh ACCP Conference on Antithrombotic and Thrombolytic Therapy. CIRCVE.9381Sept:126 (3suppl): [id number] A HCT value >55% may artifactually increase the PT.  In one study,  the increase was an average of 25%. Reference:  "Effect on Routine and Special Coagulation Testing Values of Citrate Anticoagulant Adjustment in Patients with High HCT Values." American Journal of Clinical Pathology 2006;126:400-405.)  Routine Hem:  14-Feb-14 04:22   WBC (CBC)  20.4  RBC (CBC) 4.04  Hemoglobin (CBC) 13.1  Hematocrit (CBC) 39.1  Platelet Count (CBC) 329  MCV 97  MCH 32.3  MCHC 33.4  RDW  15.8  Neutrophil % 86.5  Lymphocyte % 7.7  Monocyte % 4.8  Eosinophil % 0.5  Basophil % 0.5  Neutrophil #  17.6  Lymphocyte # 1.6  Monocyte #  1.0  Eosinophil # 0.1  Basophil # 0.1 (Result(s) reported on 26 Jun 2012 at 05:11AM.)   EKG:  EKG NSR    Radiology Results:  XRay:    14-Feb-14 05:06, Chest PA and Lateral  Chest PA and Lateral   REASON FOR EXAM:    fall with right chest trauma, hematoma  COMMENTS:       PROCEDURE: DXR - DXR CHEST PA (OR AP) AND LATERAL  - Jun 26 2012  5:06AM     RESULT: Comparison is made to the previous study of 06 April 2010. The   cardiac silhouette isenlarged. There is pulmonary vascular prominence   with interstitial thickening and peribronchial cuffing. There is no   significant effusion. There is no pneumothorax or focal consolidation   evident.    The bony structures appear to be grossly intact.    IMPRESSION:  Findings concerning for cardiomegaly with interstitial   edema. Correlate for congestive heart failure. The inspiratory effort is     somewhat shallow may contribute to the appearance.    Dictation Site: 6        Verified By: Sundra Aland, M.D., MD  CT:    14-Feb-14 04:52, CT Head Without Contrast  CT Head Without Contrast   REASON FOR EXAM:    fall with facial/head trauma  COMMENTS:       PROCEDURE: CT  - CT  HEAD WITHOUT CONTRAST  - Jun 26 2012  4:52AM     RESULT:     Technique: Helical axial 5 mm sections were obtained from the skull base   to the vertex.    Findings: There is no evidence of intra-axial or extra-axial fluid   collections, acute hemorrhage, mass effect, or depressed skull fracture.   The paranasal sinuses are patent as well as the mastoid air cells. Areas   of low-attenuation project within the subcortical, deep, and   periventricular white matter regions. This finding likely represents the     sequela of small vessel white matter ischemic disease.    IMPRESSION:      1. No evidence of focal or acute abnormalities.  2. Dr. Owens Shark of the EmergencyDepartment was informed of these findings   via a preliminary faxed report.    Thank you for the opportunity to contribute to the care of your patient.         Verified By: Mikki Santee, M.D., MD    14-Feb-14 06:30, CT Chest Abdomen and Pelvis WO  CT Chest Abdomen and Pelvis WO   REASON FOR EXAM:    (1) fall with large chest contusion, hypotension; (2)   fall with large chest cont  COMMENTS:       PROCEDURE: CT  - CT CHEST ABDOMEN AND PELVIS WO  - Jun 26 2012  6:30AM     RESULT:     Technique: Helical 3 mm sections were obtained from the thoracic inlet   through the lung bases without administration of oral or intravenous   contrast.    Findings: A subcutaneous soft tissue hematoma is identified approximately   at the level of the thoracic inlet. Scattered ground-glass opacities are   identified throughout both lungs. This may represent edema, infection,     atelectasis or possibly contusion. There is mild bronchiectasis within   the lung bases. Coarse reticular opacity is identified, greatest on the   right. There  isno evidence of pleural effusion. The aorta is normal in   caliber and contour. The heart size is unremarkable. Subcentimeter   mediastinal lymph nodes are identified. Indeterminate nodule is    identified within the left adrenal. The right adrenal is unremarkable.   Noncontrast evaluation of the liver, spleen, pancreas, bladder and   kidneys is unremarkable. The pelvis is unremarkable. There is no evidence   of bowel obstruction, free fluid, loculated fluid collections or gross   evidence of masses within the abdomen or pelvis. Moderate ectasia of the   aorta is identified. There is no evidence of displaced rib fractures.   Multilevel degenerative changes are identified within the spine.    IMPRESSION:    1.  Scattered ground-glass opacities bilaterally which are indeterminate.   Differential diagnosis is atelectasis, edema, infection or possibly   contusion in the setting of trauma.   2.  Mild bronchiectasis.  3.  No acute abdominal or pelvic abnormalities.  4.  Dr. Owens Shark of the EmergencyDepartment was informed of these findings   via a preliminary faxed report.      Thank you for this opportunity to contribute to the care of your patient.       Verified By: Mikki Santee, M.D., MD    Prednisone: Other  Vital Signs/Nurse's Notes:  **Vital Signs.:   14-Feb-14 16:16  Vital Signs Type Blood Transfusion  Temperature Temperature (F) 98.2  Celsius 36.7  Temperature Source oral  Pulse Pulse 83  Respirations Respirations 18  Systolic BP Systolic BP 696  Diastolic BP (mmHg) Diastolic BP (mmHg) 72  Mean BP 86  Pulse Ox % Pulse Ox % 91    Impression 1. Recurrent syncope: I suspect due to orthostatic hypotension in setting of mild volume depletion and possible nocturnal hypoxia.  2. Large right leg hematoma: Agree wth holding and reversing anticoagulation.  3. Persistent A-fib: currently in NSR on Flicainide. 4. Chronic diastolic heart failure: seems to be mildly volume depleted.   Plan Hold Diltiazem.  Check Echo.  Hold diuretics. Will likely need to hold anticoagulation in the near future until we make sure she is not having recurrent falls.  She will need PT  consult once better with pulse oximetry with activites to see if hypoxic.   Electronic Signatures: Kathlyn Sacramento (MD)  (Signed 14-Feb-14 17:06)  Authored: General Aspect/Present Illness, History and Physical Exam, Review of System, Home Medications, Labs, EKG , Radiology, Allergies, Vital Signs/Nurse's Notes, Impression/Plan   Last Updated: 14-Feb-14 17:06 by Kathlyn Sacramento (MD)

## 2014-09-02 NOTE — Telephone Encounter (Signed)
Left message for [facility] to keep Ms. Gosney on her current dose of warfarin and recheck her PT/INR in 2 weeks per [name].

## 2014-09-02 NOTE — Consult Note (Signed)
PATIENT NAME:  [name] [name], [name] [name] MR#:  [id number] DATE OF BIRTH:  [birth year]  DATE OF CONSULTATION:  06/26/2012  CONSULTING PHYSICIAN:  Laurier NancyShaukat A. Tashanda Fuhrer, MD  HISTORY OF PRESENT ILLNESS: This is a 81 year old with white female with a past medical history of atrial fibrillation, pulmonary embolism, requiring anticoagulation. Right now she is in a sinus rhythm. I was asked to evaluate the patient because the patient apparently had a syncopal episode x 3 and has swelling of the left leg and needed urgent consultation. The patient denies any chest pain, palpitations or shortness of breath. According to the family member, she passed out 3 times while getting up. Whenever she gets up, she feels dizzy. She was apparently hypotensive. She fell finally after a syncopal episode.   PAST MEDICAL HISTORY: History of atrial fibrillation, is on chronic anticoagulation, history of pulmonary embolism in 2008, history of CHF, history of gout, history of hypertension, hyperlipidemia, diabetes mellitus, history of sleep apnea, she uses CPAP. She was evaluated also for coronary artery disease in 2008 by Dr. Juliann Paresallwood and a stress test apparently was negative. Then she had sleep apnea evaluation and she has been getting CPAP treatment for it. Recently she says she has been followed by Dr. Mariah MillingGollan. Her INR was very high and thus, that is why she developed bleeding.   CURRENT MEDICATIONS: She has a history of being on flecainide as far as medications and metolazone, Effexor, Zyrtec, Cardizem CD, aspirin.    ALLERGIES: PENICILLIN.   SOCIAL HISTORY: She denies EtOH abuse or smoking.   FAMILY HISTORY: Unremarkable.   PHYSICAL EXAMINATION:  GENERAL: She is alert, oriented x 3, in no acute distress.  VITAL SIGNS: She is hypotensive. Her blood pressure was 91/55, right now is 133/79, respirations 18, pulse 84, temperature 98.5.  NECK: The patient has ecchymosis with swelling of the neck, probably due to hematoma.  HEART: Regular  rate and rhythm. Normal S1, S2. No audible murmur.  ABDOMEN: Soft, nontender, positive bowel sounds.  EXTREMITIES: Large swelling on the medial aspect of the lower leg due to hematoma with blisters developing.  NEUROLOGIC: She appears to be intact.   LABORATORY AND DIAGNOSTIC DATA: EKG shows normal sinus rhythm, 85 beats per minute, no acute changes. Her hemoglobin actually is right now 13.1, white count 20.4.  Electrolytes: BUN and creatinine are 23 and 1.74, potassium 3.1. CPK was 100, MB fraction 3.8. Troponin was 0.02. Her INR was 4.4 with PT of 41.8.   ASSESSMENT AND PLAN: The patient has coagulopathy secondary to being on Coumadin due to paroxysmal atrial fibrillation, currently in sinus rhythm according to the EKG and exam. Agree with giving fresh frozen plasma and also, the patient may need packed red blood cells if hemoglobin drops below 10. Currently, she is developing blisters on the left lower extremity with severe swelling due to hematoma. Advise surgical and vascular evaluation. Once the reversal of the effect of Coumadin takes place, maybe she should be put on Xarelto or just aspirin because of the risk of falling.   Thank you very much for the referral.  ____________________________ Laurier NancyShaukat A. Brayln Duque, MD sak:jm D: 06/26/2012 15:51:53 ET [name]: 06/26/2012 16:06:18 ET JOB#: [id number]  cc: Laurier NancyShaukat A. Valerie Cones, MD, <Dictator> Laurier NancySHAUKAT A Ashlon Lottman MD ELECTRONICALLY SIGNED 06/29/2012 9:04

## 2014-09-02 NOTE — Consult Note (Signed)
PATIENT NAME:  [name] [name], [name] [name] MR#:  [id number] DATE OF BIRTH:  [birth year]  DATE OF CONSULTATION:  06/26/2012  REFERRING PHYSICIAN:  Cristal Deer A. Lundquist, MD CONSULTING PHYSICIAN:  Espyn Radwan J. Cherlynn Kaiser, MD PRIMARY CARE PHYSICIAN:  Excell Seltzer, MD   REASON FOR CONSULTATION: Medical management.   HISTORY OF PRESENT ILLNESS: This is a 81 year old female who presents to the hospital today secondary to 2 syncopal episodes. The patient lives with her grandson, who noticed her to have 2 syncopal episodes. The patient was trying to get out of bed when she had her first one. She does not recall either one of the episodes. The patient's other son then came to evaluate her. She was arousable but was in significant pain. EMS was called and she was brought to the hospital. The patient at triage was noted to be significantly hypotensive. She was also noted to have a significant right leg hematoma and also a large pulmonary contusion. She was noted to be coagulopathic and in acute renal failure. Given her multiple medical problems, hospice services were contacted for medical management and consultation. The patient presently denies any chest pain, any shortness of breath, any nausea, vomiting, abdominal pain, fevers, chills, cough. The only symptom she really has is right leg pain. Also as per the patient, she has not been feeling dizzy or lightheaded, or has had any recent syncopal episode or fall in the past few months. She did have a fall a few years back. She denies any palpitations, any dizziness or any other associated symptoms presently.   REVIEW OF SYSTEMS:    CONSTITUTIONAL: No documented fever. No weight gain, no weight loss.  EYES: No blurred or double vision.  ENT: No tinnitus. No postnasal drip. No redness of the oropharynx.  RESPIRATORY: No cough, no wheeze, no hemoptysis, no dyspnea.  CARDIOVASCULAR: No chest pain, no orthopnea, no palpitations. Positive syncope.  GASTROINTESTINAL: No nausea, no  vomiting, no diarrhea, no abdominal pain, no melena, no hematochezia.  GENITOURINARY: No dysuria, no hematuria.  ENDOCRINE: No polyuria or nocturia. No heat or cold intolerance. HEMATOLOGIC: No anemia but positive bruising and bleeding.  INTEGUMENTARY: No rashes, no lesions.  MUSCULOSKELETAL: No arthritis, no swelling, no gout.  NEUROLOGIC: No numbness, no tingling, no ataxia, no seizure-type activity.  PSYCHIATRIC: No anxiety, no insomnia, no ADD. Positive depression.   PAST MEDICAL HISTORY: Consistent with chronic atrial fibrillation, history of pulmonary embolism, depression, history of shingles with chronic pain.   ALLERGIES: PREDNISONE.   SOCIAL HISTORY: No smoking. Occasional alcohol use. No illicit drug abuse. Lives at home with her grandson.   FAMILY HISTORY: Significant for heart disease and stroke on both mother's and father's side of family.   CURRENT MEDICATIONS: Allopurinol 100 mg daily, aspirin 81 mg daily, Cardizem CD 120 mg daily, Coumadin 5 mg daily, CPAP at bedtime, flecainide 100 mg b.i.d., metolazone 2.5 mg daily, OxyContin 10 mg b.i.d., torsemide 20 mg daily and Effexor 150 mg daily.   PHYSICAL EXAMINATION:  VITAL SIGNS: Temperature 98.1, pulse 84, respirations 18, blood pressure 95/62, sats 98% on room air.  GENERAL: She is a pleasant appearing female in no apparent distress.  HEENT: Atraumatic, normocephalic. Extraocular muscles are intact. Pupils equal and reactive to light. Sclerae anicteric. No conjunctival injection. No pharyngeal erythema. She does have a small lip laceration which is oozing.  NECK: Supple. No jugular venous distention, no bruits, no lymphadenopathy, no thyromegaly.  HEART: Regular rate and rhythm. No murmurs, no rubs, no clicks.  LUNGS: Clear  to auscultation bilaterally. No rales, no rhonchi, no wheezes. No dullness to percussion.  ABDOMEN: Soft, flat, nontender, nondistended. Has good bowel sounds. No hepatosplenomegaly appreciated.   EXTREMITIES: No evidence of any cyanosis or clubbing. She does have +1 to 2 pitting edema bilaterally. She has an extensive right leg hematoma and her right leg is fairly tense, +2 pedal and radial pulses bilaterally.  NEUROLOGICAL: The patient is alert, alert, awake, oriented x 3 with no focal motor or sensory deficits appreciated bilaterally.  SKIN: Moist and warm with no rash appreciated.  LYMPHATIC: There is no cervical or axillary lymphadenopathy.   LABORATORY, DIAGNOSTIC, AND RADIOLOGICAL DATA: Serum glucose 122, BUN 23, creatinine 1.7, sodium 136, potassium 3.1, chloride 96, bicarbonate 27. LFTs are within normal limits. Troponin less than 0.02. White cell count of 20.4, hemoglobin 13.1, hematocrit 39.1, platelet count 329. INR is 4.4.   The patient did have a chest x-ray done which shows cardiomegaly. Also had a CT of the maxillofacial area which showed no evidence of osseous abnormalities. The patient had a CT of the head done without contrast which showed no evidence of acute focal or acute abnormalities. X-ray of the right tibia and fibula shows no bony abnormality. CT of the neck done without contrast showing subcutaneous hematoma in the anterior neck just above the level of the clavicles. CT chest, abdomen and pelvis done without contrast showing scattered ground-glass opacities bilaterally which are indeterminate, mild bronchiectasis, no acute abdominal or pelvic abnormalities.   ASSESSMENT AND PLAN: This is a 81 year old female with history of chronic atrial fibrillation, history of pulmonary embolism, depression, history of shingles with chronic pain, who presents to the hospital with 2 syncopal episodes and noted to have a significant right leg hematoma and pulmonary contusion.  1.  Syncope: The exact etiology of syncope is unclear, whether it is vasovagal versus cardiogenic versus neurogenic. CT head is negative. She has a nonfocal neurological exam. I cannot do orthostatics on her, as  she cannot stand or bear weight on that right leg. For now, I will keep her on telemetry for 24 hours to look for any arrhythmia, as she is on antiarrhythmics like flecainide. Will get a 2-dimensional echocardiogram. Also get a cardiology consult and discuss the case with Dr. Kirke Corin.  2.  Right leg hematoma and pulmonary contusion: This is likely the result of the fall/syncope. Continue pain control and management as per general surgery. She may need surgical evacuation of the right leg hematoma.  3.  Coagulopathy: This is secondary to Coumadin. The patient now presents with acute bleeding. I will discontinue her Coumadin for now. Reverse her INR with some fresh frozen plasma and vitamin K. Follow PT and INR. She likely should not be on anticoagulation given high fall risk.  4.  Chronic atrial fibrillation: The patient is currently rate controlled. Will continue her flecainide, but hold her Cardizem given the hypotension. She is likely not a good candidate for long-term anticoagulation given her fall.  5.  Acute renal failure: Exact etiology unclear, but probably related to dehydration and hypotension. Would hydrate her with IV fluids aggressively. Follow BUN and creatinine and urine output. Renal dose medications. Avoid nephrotoxins. I will go ahead and place a Foley catheter and follow urine output. Hold her metolazone and torsemide for now.  6.  Leukocytosis: Again, the etiology of this is unclear. Unlikely to be infectious. Likely stress mediated from the fall. I will follow her white cell count closely and follow fever curve.  7.  Obstructive sleep apnea: Continue with the CPAP at bedtime.   CODE STATUS: The patient is a full code.   Thank you so much for the consultation. Will follow along with you.   TIME SPENT: 50 minutes.    ____________________________ Rolly PancakeVivek J. Cherlynn KaiserSainani, MD vjs:jm D: 06/26/2012 13:50:56 ET T: 06/26/2012 14:19:00 ET JOB#: [id number]  cc: Rolly PancakeVivek J. Cherlynn KaiserSainani, MD,  <Dictator> Houston SirenVIVEK J Samaiya Awadallah MD ELECTRONICALLY SIGNED 06/28/2012 20:55

## 2014-09-02 NOTE — H&P (Signed)
PATIENT NAME:  [name], [name] MR#:  [id number] DATE OF BIRTH:  [birth year]  DATE OF ADMISSION:  06/26/2012  REASON FOR ADMISSION: Fall with what appears to be multiple soft tissue injuries and hypotension.   HISTORY OF PRESENT ILLNESS: [name] [name] is a pleasant 81 year old female with multiple medical issues including afib and pulmonary embolism requiring anticoagulation. She presents today after a fall, apparently x 2. First was at 12:30, and she does not remember but apparently another at approximately 1:00 a.m. She says that she does not remember the circumstance surrounding the fall. She has had falls in the past requiring admission, in 2011. Currently is alert and oriented x 3. She says that the main thing that is bothering her right now is her right leg over which there is a large hematoma. Also has a lip laceration and a large suprasternal hematoma. Otherwise no fevers, chills, shortness of breath, cough, chest pain, abdominal pain, nausea, vomiting, diarrhea, constipation, dysuria or hematuria. No paraspinal or spinal tenderness.   PAST MEDICAL HISTORY: 1. History of atrial fibrillation, on chronic Coumadin.  2. History of pulmonary embolism in 2008.  3. History of shingles.  4. History of CHF.  5. History of gout. 6. History of hypertension.  7. History of hyperlipidemia.  8. Diabetes mellitus.  9. History of appendectomy.  10. History of leg debridement and skin grafting by Dr. Egbert Garibaldi in 2011.   HOME MEDICATIONS: 1. Zyrtec 10 mg p.o. daily. 2. Effexor extended-release 150 mg p.o. daily. 3. Torsemide  p.o. daily. 4. OxyContin 10 mg p.o. q. 12 hours.  5. Metolazone 2.5 mg p.o. daily. 6. K-Dur 20 milliequivalents p.o. 2 times a day.  7. Flecainide 100 mg p.o. 2 times a day.  8. Coumadin 5 mg p.o. daily. 9. Cardizem CD 120 mg p.o. q. 24 hours.  10. Aspirin enteric-coated 81 mg p.o. daily. 11. Allopurinol 100 mg p.o. daily.   ALLERGIES: PREDNISONE.   SOCIAL HISTORY: Denies alcohol  or drug use. Denies any illicit drugs. Is here with her children.   FAMILY HISTORY: Noncontributory.  REVIEW OF SYSTEMS: A 12 point review was obtained. Pertinent positives and negatives as above.   PHYSICAL EXAM: VITAL SIGNS: Currently temperature is 97.4, pulse 75, blood pressure 98/49, respirations 20 and pulse ox 96% on room air.  GENERAL: No acute distress. Alert and oriented x 3. Pleasant.  HEAD: Normocephalic, atraumatic. Does have some blood in her scalp, but no obvious lacerations.  EYES: No scleral icterus. No conjunctivitis.  FACE: No obvious facial fractures. Does have a laceration which appears to be full-thickness, in her lower lip, which is hemostatic at this time. Normal external ears.  NECK: Soft, supple. Does have a hematoma on lower aspect of the neck, which appears to be expanding, but significant contusion.   CHEST: Lungs clear to auscultation, moving air well. No obvious contusions over chest. No obvious chest tenderness.  ABDOMEN: Soft, nontender, nondistended. No obvious tenderness. LEGS: Right lower extremity with a large hematoma, which is firm, in the anteromedial aspect, with some skin discoloration. Is able to move all extremities well. Sensation is intact in all 4 extremities. Strength is 5 out of 5 in all 4 extremities. Left leg has a skin graft, on the lower extremity, from previous skin graft.  SPINE: No spinal tenderness, paramedian or lower midline spinal tenderness.   LABS:  Significant for white cell count of 20.4, hemoglobin 13.1, hematocrit 39.1 and  platelets 329. Renal panel is significant for a potassium of 3.1,  creatinine of 1.74, has been as high as 2.5 in the past. Normal appears at 1.3. LFTs are normal. INR is 4.4.   RADIOLOGIC DATA:  Did have a CT of head which showed no obvious intracranial bleed. CT of neck, which was noncontrast, so unable to evaluate for any carotid injuries and at lower aspect of neck and aspect of chest does have some soft  tissue stranding consistent with hematoma. Lungs clear for auscultation. Potentially some small areas of contusion, but not significant. Abdomen with no obvious intraabdominal blood. Again, noncontrast due to the patient's creatinine. Does have an area of calcification posteriorly, which I do not believe is due to blood  as she does not have large ecchymosis on physical examination.  Right lower extremity x-ray: No obvious fractures.   ASSESSMENT AND PLAN: [name] [name] is a pleasant 81 year old female with a fall whose injuries include multiple soft tissue hematomas as well as a lip laceration. I will admit her for monitoring and resuscitation as she is hypotensive. As she has multiple medical issues, we obtain hospitalist consultation, as well as for possible syncopal workup. We will continue to watch signs or symptoms of bleeding. She has gotten 5 mg of vitamin K and will reverse if indication of bleeding. Lip laceration will be closed by the Emergency Room doctor. We will continue to watch extremity hematoma. It is my belief and possible that this may cause pressure across the skin and may need to be evacuated, but there is no indication for evacuation at this time.  ____________________________ Si Raiderhristopher A. Ashanna Heinsohn, MD cal:sb D: 06/26/2012 07:55:01 ET T: 06/26/2012 08:12:15 ET JOB#: [id number]  cc: Cristal Deerhristopher A. Shanessa Hodak, MD, <Dictator> Jarvis NewcomerHRISTOPHER A Velmer Woelfel MD ELECTRONICALLY SIGNED 07/01/2012 14:04

## 2014-09-02 NOTE — Consult Note (Signed)
Consult dictated, 80 ypwf with h/o afib/PE ,now in NSR, came with 3 syncopal episodes and developed hematoma in right LE/neck with INR 4.4. Agree with FFP, and may need PRBC if hemoglobin drops below 10, and surgical/vascular consult.  Electronic Signatures: Radene KneeKhan, Shaukat Ali (MD)  (Signed on 14-Feb-14 15:54)  Authored  Last Updated: 14-Feb-14 15:54 by Radene KneeKhan, Shaukat Ali (MD)

## 2014-09-02 NOTE — Consult Note (Signed)
Brief Consult Note: Diagnosis: 1. Syncope 2. Right leg hematoma/Pulm. contusion 3. Acquired Coagulopathy 4. chronic a. fib 5. OSA.   Patient was seen by consultant.   Consult note dictated.   Recommend to proceed with surgery or procedure.   Orders entered.   Discussed with Attending MD.   Comments: 81 yo female w/ hx of chronic a. fib, hx of PE, Depression, hx of shingles w/ chronic pain came into hospital w/ 2 syncopal episodes and noted to have a right leg hematoma w/ pulm. contusion.    1. Syncope - etiology unclear presently. ?? vasovagal (vs) Cardiogenic (vs) Neurogenic - CT head (-).  Cannot do orthostatics as pt. cannot stand on the right leg.  - will keep on tele for 24 hrs, 2-D Echo.  Will get Cardiology consult (Discussed w/ Dr. Kirke CorinArida)  2. Right leg hematoma/PUlm. contusion - result of the fall/syncope.  - cont. pain control and management as per General Surgery.   3. Acquired Coagulopathy - due to coumadin and now w/ acute bleeding.  - will d/c coumadin and reverse INR w/ FFP & Vitamin K.  Follow PT/INR  4. Chronic a. fib - rate controlled.  cont. Flecanide but hold Cardizem given relative hypotension.  - likely not a good candidate for longer term-anticogulation given high fall risk.   5. Acute Renal Failure - likely related to dehydration, hypotension.  - will cont. IV fluids for now and follow BUN/Cr and urine output.  - place foley follow urine output.  HOld Metolazone, Torsemide.   6. Leukocytosis - likely stress mediated.  No acute source of infection and will monitor.   7. OSA - cont. CPAP at bedtime.   Full Code  Thanks for the consult and will follow with you.  Job # [id number].  Electronic Signatures: [place] SirenSainani, Shakari Qazi J (MD)  (Signed 14-Feb-14 13:51)  Authored: Brief Consult Note   Last Updated: 14-Feb-14 13:51 by [place] SirenSainani, Genny Caulder J (MD)

## 2014-09-02 NOTE — Discharge Summary (Signed)
PATIENT NAMAneta Mins:  [name] [name], [name] [name] MR#:  [id number] DATE OF BIRTH:  1931/11/30  DATE OF ADMISSION:  06/27/2012 DATE OF DISCHARGE:    FINAL DIAGNOSES:  1.  Massive Right lower extremity hematoma with skin necrosis.  2.  suprasternal notch and chest wall hematoma.  3.  Hypoxemia.  4.  Atrial fibrillation with rapid ventricular response now converted to normal sinus rhythm.  5.  Elevated cardiac enzymes.  6.  Obesity.  7. History of deep vein thrombosis and pulmonary embolism on chronic anticoagulation therapy.  8.  Sleep apnea, noncompliant.  9.  Acute Blood loss anemia.  DISCHARGE MEDICATIONS: Allopurinol 100 mg by mouth daily, Zyrtec 10 mg by mouth daily, diltiazem ER capsule 120 mg by mouth daily, OxyContin ER 10 mg by mouth b.i.d., Protonix 40 mg IV daily, Effexor-XR 150 mg by mouth daily, Lasix 20 mg IV b.i.d., Diflucan 150 mg by mouth daily, Tylenol morphine 2 to 4 mg IV q. 2 hours p.r.n. pain, Zofran 4 mg IV q. 4 hours p.r.n. nausea, vomiting, Phenergan 12.5/25 mg intramuscular q. 4 hours p.r.n. nausea, vomiting, Phenergan suppository 25 mg every 4 to 6 hours p.r.n. nausea and vomiting.  HOSPITAL COURSE SUMMARY: The patient was admitted to the surgical service on the 14th of February following a syncopal episode at home. The patient is completly amnestic to the event. She was found to have a large right lower extremity subcutaneous hematoma and markedly elevated INR. She had a lip laceration and large suprasternal hematoma. Lip laceration was repaired in the Emergency Room. The patient was admitted to the surgical service. Initially her hemoglobin was 13.1, white count 20.1K, platelet count 329,000. Her INR was found to be 4.4 on admission. This was corrected with FFP. She continued to have a substantial amount of lower extremity swelling and then the development of early skin necrosis and development insensate area on this right leg.  The patient then developed atrial fibrillation with rapid  ventricular response requiring transfer to the intensive care unit as well as a cardiology consultation initiating a Cardizem drip. The patient did convert to normal sinus rhythm; however, has developed hypoxemia. Hemoglobin on the morning of the 17th was 7.2. The patient did require a 1 unit transfusion. She did have a low-grade transfusion reaction with elevated temperature, but her antibody screen was negative. After discussion with the family earlier today on rounds with the patient, the patient elected to be transferred to a tertiary care medical center for definitive therapy of what will require a substantial lower extremity surgery and skin grafting in the setting of significant comorbidities. I did speak with Dr. Bonnee QuinSamuel Jones, by telephone of burn center and transfer will be arranged.   CONDITION ON DISCHARGE:  Guarded   [facility].     ____________________________ Redge GainerMark A. Egbert GaribaldiBird, MD mab:cc D: 06/29/2012 20:51:10 ET [name]: 06/29/2012 21:46:29 ET JOB#: [id number]  cc: Loraine LericheMark A. Egbert GaribaldiBird, MD, <Dictator> Excell SeltzerAmy E. Bedsole, MD Yevonne PaxSaadat A. Khan, MD Chelsea AusMuhammad A. Kirke CorinArida, MD  Raynald KempMARK A Jamillia Closson MD ELECTRONICALLY SIGNED 06/30/2012 6:44

## 2014-09-03 NOTE — Consult Note (Signed)
PATIENT NAME:  [name] [name], [name] [name] MR#:  [id number] DATE OF BIRTH:  12-24-1931  DATE OF CONSULTATION:  06/16/2013  REFERRING PHYSICIAN:  Dr. Janalyn Harder CONSULTING PHYSICIAN:  Ollen Gross. Willeen Cass, MD  REASON FOR CONSULTATION: Epistaxis.   HISTORY OF PRESENT ILLNESS:  [name] on Coumadin presented to the Emergency Room with right-sided epistaxis, started earlier today and has persisted despite attempts at packing the nose by Dr. Carollee Massed. Her blood pressure has not been elevated but she is on Coumadin and her INR was 3.7.   PAST MEDICAL HISTORY: Congestive heart failure, hypertension, gout, shingles, diabetes, obstructive sleep apnea, history of MRSA.   PAST SURGICAL HISTORY: Appendectomy.   MEDICATIONS:  Zinc with vitamin A and C one lozenge orally daily, zinc sulfate 220 mg p.o. b.i.d., Coumadin 5 mg p.o. daily, all days except Monday, Wednesday, Friday. Coumadin 2.5 mg p.o. daily on Monday, Wednesday, Friday. Vitamin C one p.o. daily, venlafaxine 150 mg p.o. daily, senna 1 p.o. daily, potassium chloride 3 tabs (30 mEq orally) b.i.d., oxycodone 1 to 2 p.o. q. 4 to 6 hours p.r.n. pain, multivitamin 1 p.o. daily, MiraLAX 17 grams orally daily, metolazone 2.5 mg p.o. b.i.d., Lasix 40 mg p.o. daily, flecainide 100 mg 1 p.o. q. 12 hours, diltiazem 120 mg p.o. daily, colchicine 0.6 mg p.o. daily, Atrovent HFA 1 puff q.i.d., atorvastatin 10 mg p.o. daily, aspirin 81 mg p.o. daily.   ALLERGIES: PREDNISONE.   REVIEW OF SYSTEMS: The patient has not had any nausea, vomiting, diarrhea. She does feel excessively weak since this started with some shortness of breath.   PHYSICAL EXAMINATION: VITAL SIGNS: Pulse 83, respirations 20, blood pressure 123/65. GENERAL:  Morbidly obese female slightly lethargic but responsive to questions. There is some slight oozing from her nose at this point around the pack. HEAD AND FACE:  Head is normocephalic, atraumatic. There are no facial skin lesions.  NOSE:  External nose unremarkable. There is a Rhino Rocket pack in the right nasal cavity with bleeding around it. There is no bleeding from the left nasal cavity. ORAL CAVITY AND OROPHARYNX:  Lips, gums, tongue, floor of mouth are unremarkable. Posterior pharynx does not reveal any bleeding down the back of the throat at all.  NECK:  Supple without adenopathy or mass. There is no thyromegaly.   The Rhino Rocket pack was removed and the nose inspected and suctioned. Active bleeding was noted from the anterior nasal septum about 2 cm back with no other source of bleeding seen. This actually slowed some with suctioning and I was able to cauterize the blood vessel and stop the bleeding. Because of the severity of the nosebleed however and the anticoagulation status, it was felt best to go ahead and place a small pack in the right nasal cavity. I put a short 3 cm Merocel pack in the right side of the nose over the area of bleeding and expanded it with Afrin. No additional bleeding was seen at this point. There was no bleeding down the pack and her throat was reinspected with no bleeding down the back of the throat.   ASSESSMENT: This patient experienced epistaxis from the right anterior nasal septum which I was able to control with cautery. A pack was placed to provide additional insurance against rebleeding. I talked with the hospitalist and they will observe her at least overnight as she does feel excessively weak and has multiple comorbidities. As long as the bleeding is controlled with the pack, we will plan on removing the  pack in my office next Monday. They will put her on antistaphylococcal antibiotic prophylaxis until the pack is removed.   ____________________________ Ollen GrossPaul S. Willeen CassBennett, MD psb:ce D: 06/16/2013 16:37:10 ET T: 06/16/2013 17:42:40 ET JOB#: [id number]  cc: Ollen GrossPaul S. Willeen CassBennett, MD, <Dictator> Sandi MealyPAUL S Denis Carreon MD ELECTRONICALLY SIGNED 06/23/2013 8:55

## 2014-09-03 NOTE — Discharge Summary (Signed)
PATIENT NAMAneta Mins:  [name], [name] MR#:  [id number] DATE OF BIRTH:  01/23/1932  DATE OF ADMISSION:  06/16/2013 DATE OF DISCHARGE:  06/18/2013.  ADMITTING PHYSICIAN:  Delfino LovettVipul Shah, MD.   DISCHARGING PHYSICIAN:  Enid Baasadhika Jervis Trapani, MD   PRIMARY CARE PHYSICIAN:  Dr. [name].   CONSULTATIONS IN THE HOSPITAL:  1.  ENT consultation with Dr. Geanie LoganPaul Bennett.  2.  Cardiology consultation by Dr. Kirke CorinArida.   DISCHARGE DIAGNOSES:  1.  Significant epistaxis.  2.  Acquired coagulopathy on Coumadin.  3.  Chronic atrial fibrillation on Coumadin.  4.  A history of pulmonary embolism and deep vein thrombosis.  5.  A history of right leg hematoma after trauma.  6.  Depression.  7.  Chronic respiratory failure secondary to chronic diastolic congestive heart failure on 2.5 L home oxygen. 8.  Diastolic congestive heart failure. 9.  A culture on chronic anemia from epistaxis. 10.  Bronchitis.  11.  Leukocytosis. 12.  Chronic kidney disease, stage III.  DISCHARGE MEDICATIONS: 1.  Colchicine 0.6 mg p.o. daily.  2.  Vitamin C 1 tablet p.o. daily.  3.  Atorvastatin 10 mg p.o. daily.  4.  Atrovent inhaler 1 puff 4 times a day.  5.  Venlafaxine 150 mg p.o. daily.  6.  Zinc sulfate 220 mg p.o. daily.  7.  Zinc with vitamin A and C lozenges 1 lozenge daily.  8.  Cardizem 120 mg p.o. daily.  9.  Flecainide 100 mg p.o. b.i.d.  10.  Lasix 40 mg p.o. daily.  11.  Metolazone 2.5 mg p.o. b.i.d.  12.  MiraLAX powder p.r.n. for constipation.  13.  Multivitamin 1 tablet p.o. daily.  14.  Oxycodone 1 to 2 tablets q.6 hours p.r.n. for pain.  15.  Potassium chloride 30 mEq p.o. 2 times a day.  16.  Senokot 1 tablet daily.  17.  Aspirin 81 mg p.o. daily.  18.  Levaquin 250 mg p.o. daily for 6 days.  19.  Keflex 500 mg p.o. q.8 hours for 6 days.   DISCHARGE OXYGEN:  Three L home O2.   DISCHARGE DIET:  Low-sodium diet.   DISCHARGE ACTIVITY:  As tolerated.   FOLLOWUP INSTRUCTIONS: 1.  Resume home Health services,  will need physical therapy and nursing.  2.  ENT followup in 2 days on 06/21/2013 for packing removal from the right nose.  3.  Cardiology followup with Dr. Mariah MillingGollan in 1 to 3 weeks.  4.  Hemoglobin check in 3 to 4 days.  5.  Please use a humidifier with oxygen.   LABORATORY AND IMAGING STUDIES PRIOR TO DISCHARGE:  WBC 18.3, hemoglobin is 9.2, hematocrit 26.6, platelet count 324. Sodium is 136, potassium 3.2, chloride 95, bicarb 38, BUN 77, creatinine 1.79, glucose 117 and calcium of 9.6. INR prior to discharge is 1.6.  Chest x-ray on admission showing low volume film with cardiomegaly, left base atelectasis and maybe retrocardiac infiltrate which is difficult to exclude. INR on admission was 3.6.  BRIEF HOSPITAL COURSE:  [name] [name] is a 81 year old obese Caucasian female with past medical history significant for chronic respiratory failure secondary to chronic diastolic CHF on 3 L home oxygen continuously, a history of DVT and PE on Coumadin and a history of chronic a-fib paroxysmal on Coumadin as well who is in normal sinus rhythm, presents to the hospital secondary to uncontrollable epistaxis.  1.  Epistaxis. Her INR 3.1 on admission, likely acquired coagulopathy from Coumadin. It was difficult to control just with regular nasal  sprays so ENT was consulted. They had to cauterize an artery in the anterior part of the right nares and packing is placed. The patient did swallow a significant amount of blood during the epistaxis episode, and her hemoglobin dropped while in the hospital. Her packing will be removed in 3 days and ENT followup has been set up and the patient has not had further bleeding. The patient will be discharged on Keflex for staphylococcal coverage until the packing is taking out.  2.  Acute on chronic anemia, likely secondary to nose bleed. The patient did have a couple of bloody stools but probably from swallowing the blood from the epistaxis. The hemoglobin remained stable around 9. She  came in with a hemoglobin of 12 though with some dilation it dropped down to 10 and then 9 at the time of discharge. The patient is not orthostatically positive for hypotension. She worked with Physical Therapy so she will have a followup hemoglobin check next week.  3.  Paroxysmal atrial fibrillation, currently in normal sinus rhythm, seen by Cardiology. Coumadin is being held due to anemia and epistaxis at this time; however, a prior history of transient ischemic attack and family very concerned and want to be back on the Coumadin, which can be restarted after Cardiology followup next week. Her goal INR could be maintained on the lower side around 2. She is already on Cardizem, flecainide for her atrial fibrillation.  4.  Chronic diastolic congestive heart failure. The patient's blood pressure was stable. She was well compensated. She is on Lasix and metolazone with potassium supplements at home.  5.  Acute bronchitis symptoms, chest x-ray with possible infiltrate. The patient is being discharged on Levaquin for this same. 6.  Her course has been otherwise uneventful in the hospital. The patient worked with Physical Therapy who recommended that she will need Home Health. The patient already followed by Home Health and physical therapy services will be resumes.   DISCHARGE CONDITION:  Stable.   DISCHARGE DISPOSITION:  Home with Home Health and also a 24-hour assistance by son.   CODE STATUS:  FULL CODE.   TIME SPENT ON DISCHARGE:  45 minutes.   ____________________________ Enid Baas, MD rk:jm D: 06/18/2013 15:15:16 ET T: 06/18/2013 16:58:20 ET JOB#: [id number]  cc: Enid Baas, MD, <Dictator> Enid Baas MD ELECTRONICALLY SIGNED 06/29/2013 14:59

## 2014-09-03 NOTE — Consult Note (Signed)
General Aspect [name] [name] is a 81yo Caucasian female w/ PMHx s/f chronic respiratory failure (O2 dependent), persistent atrial fibrillation (chronic flecainide and warfarin therapy), h/o DVT/PE (5 years ago), HFpEF, morbid obesity, deconditioning, OSA (on CPAP), OHS, chronic RLE wound, nonobstructive CAD (by 2010 cath), h/o CVA, AAA, HLD and HTN who was admitted to [facility] yesterday for severe epistaxis. Cardiology consulted re: anticoagulation recs.   The patient has been maintaining NSR on flecainide. She reports no recurrence of a-fib in "quite some time." She had a fall 06/2012 w/ resultant RLE hematoma (INR > 4.0 at the time). This was debrided, and the wound has been slow to heal. She still sees wound care currently (1 year later). She reports she is steady on her feet with the aide of a walker. She lives with her son. "I do not want another stroke." She denies chest pain, palpitations, lightheadedness, syncope. Endorses chronic LE edema (CVI, obesity), PND, orthopnea from chronic resp failure. She follows Dr. Kendrick Fries for this. Maintained on CPAP, O2. Recent scarring on CXR. If persists, work-up for pulmonary fibrosis planned.  She reports experiencing profound nose bleeding around noon yesterday. The day prior, she blew her nose and noticed a large blood clot, but no ongoing bleeding. She continues to take Coumadin as prescribed (INR 2.9 on 06/09/13, managed by [facility], range 2.5-3.5). Given her ongoing bleeding, she called EMS and was transported to the ED.   Present Illness There, INR returned elevated at 3.6. H/H 12/6.36.5. WBC 14.2K. BMP- BUN 60/Cr 1.84. K 3.1. CXR- low volume film with cardiomegaly and left base probable atelectasis although retrocardiac infiltrate difficult to exclude. She was given  of oral vitamin K. ENT cauterized a hemorrhaging vessel in R anterior nasal septum and packed. No further bleeding. She was admitted for observation. H/H 10.3/31.2, WBC 19.6 today. Placed on Keflex,  Levaquin. K 2.7, Mg 2.5.   Telemetry review reveals NSR, occasional PACs.   PAST MEDICAL HISTORY: 1.  Atrial fibrillation, on Coumadin.   2.  History of PE.  3.  Depression. 4.  History of shingles with chronic pain.   PAST SURGICAL HISTORY 1. Appendectomy 2. Cholecystectomy 3. Tonsillectomy 8. H/o DCCV in 2008  ALLERGIES: PREDNISOLONE  SOCIAL HISTORY: No smoking. Occasional alcohol or illicit drug use.   FAMILY HISTORY: Positive for heart disease and stroke on both mother and father's sides of the family.   Physical Exam:  GEN no acute distress, obese, disheveled   HEENT pink conjunctivae, PERRL, hearing intact to voice, R nasal packing appreciated, dried blood behind R ear   NECK supple  No masses  trachea midline  redundant neck tissue, no JVD or bruits   RESP normal resp effort  no use of accessory muscles  diffusely diminished breath sounds with shallow inspiration, no wheezes, rales or rhonchi   CARD Regular rate and rhythm  Normal, S1, S2  No murmur  occasional extra-systoles, no gallops   ABD denies tenderness  soft  normal BS   EXTR negative cyanosis/clubbing, positive edema, nonpitting bilateral LE edema, hyperpigmentation, induration, RLE leg wrap applied, no bleeding or tenderness   SKIN tight to palpation   NEURO follows commands, motor/sensory function intact   PSYCH alert, A+O to time, place, person   Review of Systems:  Subjective/Chief Complaint epistaxis   General: Fatigue   ENT: Nosebleeds   Respiratory: Short of breath  DOE, PND, orthopnea   Review of Systems: All other systems were reviewed and found to be negative   Lab  Results:  Routine Chem:  04-Feb-15 13:34   BUN  60  Creatinine (comp)  1.84  05-Feb-15 07:01   BUN  99  Creatinine (comp)  1.89  Routine Hem:  04-Feb-15 13:34   WBC (CBC)  14.2  Hemoglobin (CBC) 12.6  Hematocrit (CBC) 38.5  05-Feb-15 07:01   WBC (CBC)  19.6  Hemoglobin (CBC)  10.3  Hematocrit (CBC)  31.2    Radiology Results: XRay:    04-Feb-15 15:22, Chest Portable Single View  Chest Portable Single View   REASON FOR EXAM:    hx of CHF, O2 dependent, with respiratory difficulty   with current epistaxis  COMMENTS:       PROCEDURE: DXR - DXR PORTABLE CHEST SINGLE VIEW  - Jun 16 2013  3:22PM     CLINICAL DATA:  Nosebleed.    EXAM:  PORTABLE CHEST - 1 VIEW    COMPARISON:  06/29/2012    FINDINGS:  1514 hrs. Low volume film with vascular congestion. No overt  airspace pulmonary edema. Retrocardiac density likely secondary to  atelectasis. Cardiopericardial silhouette is enlarged. Rightward  deviation of the trachea is similar to an exam 04/06/2010. Bones are  diffusely demineralized.     IMPRESSION:  Low volume film with cardiomegaly and left base probable atelectasis  although retrocardiac infiltrate difficult to exclude.      Electronically Signed    By: Kennith Center M.D.    On: 06/16/2013 15:25       Verified By: ERIC A. MANSELL, M.D.,    Prednisone: Other  Vital Signs/Nurse's Notes: **Vital Signs.:   05-Feb-15 05:11  Vital Signs Type Routine  Temperature Temperature (F) 97.9  Celsius 36.6  Temperature Source axillary  Pulse Pulse 79  Respirations Respirations 20  Systolic BP Systolic BP 110  Diastolic BP (mmHg) Diastolic BP (mmHg) 63  Mean BP 78  Pulse Ox % Pulse Ox % 96  Pulse Ox Activity Level  At rest  Oxygen Delivery Venturi Mask    Impression 1. Coumadin-induced coagulopathy INR 3.6 on arrival. Unclear why goal 2.5-3.5, no h/o valve replacement. DVT/PE ~ 5 years ago. CHA2DS2VASc = 8 (CHF, HTN, age >72, h/o CVA, CAD/AAA, female) placing her at very high cardioembolic risk. She had a fall this time last year with resultant large hematoma s/p evacuation, resultant nonhealing wound requiring extended wound care. Still not healed. Severe epistaxis yesterday w/ dip in H/H. The patient is adamant on continuing Coumadin. She has supervision at home, lives with  son.  -- Unclear reasons for high INR range? Would reduce to 2.0-3.0, keep close to 2.0 with frequent monitoring. Coumadin a good choice in case reversal needed. She has had bleeding complications in the last year.  -- venlafaxine-warfarin interaction- increased risk of bleeding, monitor INR frequently w/ this vs alternative medication. Defer this decision to PCP/psychiatrist.   2. Epistaxis s/p cauterization, nasal packing Bleeding improved. INR supratherapeutic on admission.  -- Management per ENT, primary teams  3. Persistent atrial fibrillation Maintaining NSR on flecainide. Mild structural cardiac changes- mild LAE per echo last year, nonobstructive CAD by 2010 cath. Tolerating well w/ no associated arrhythmias.  -- Continue Coumadin, see #1 -- Continue flecainide for now -- Will need to see in follow-up in 2-4 weeks, may benefit from EP referral as well for further insight  4. Hypokalemia, hypermagnesemia -- Continue OP diuretics -- Replete K  5. Chronic respiratory failure Multifactorial 2/2 OSA, OHS, deconditioning, HFpEF. O2 dependent, stable.  Euvolemic on exam.  -- Continue  outpatient management. Regarding chronic diastolic CHF, resume outpatient diuretics.   Electronic Signatures for Addendum Section:  Lorine BearsArida, Muhammad (MD) (Signed Addendum 05-Feb-15 11:32)  The patient was seen and examined. Agree with the above. Known history of A-fib with Warfarin. She presented with nose bleed which was packed. INR was 3.7.  She is at extremely high risk for recurrent thrombo-embolic complications related to A-fib. I favor continued anticoagulation with goal INR of 2-3.   Electronic Signatures: Gery Prayrguello, Quincie Haroon A (PA-C)  (Signed 05-Feb-15 10:07)  Authored: General Aspect/Present Illness, History and Physical Exam, Review of System, Home Medications, Labs, Radiology, Allergies, Vital Signs/Nurse's Notes, Impression/Plan Lorine BearsArida, Muhammad (MD)  (Signed 05-Feb-15 11:32)  Co-Signer: General  Aspect/Present Illness, History and Physical Exam, Review of System, Home Medications, Labs, Radiology, Allergies, Vital Signs/Nurse's Notes, Impression/Plan   Last Updated: 05-Feb-15 11:32 by Lorine BearsArida, Muhammad (MD)

## 2014-09-03 NOTE — H&P (Signed)
PATIENT NAME:  Aneta MinsMASSEY, Shayna T MR#:  [id number] DATE OF BIRTH:  [birth year]  DATE OF ADMISSION:  06/16/2013  PRIMARY CARE PHYSICIAN: Dr. [name]  REQUESTING PHYSICIAN: Dr. Janalyn Harderavid Kaminski.   CHIEF COMPLAINT: Nose bleed.   HISTORY OF PRESENT ILLNESS:   The patient is a 81 year old female with a known history of chronic atrial fibrillation and PE on Coumadin. She is being admitted for uncontrolled epistaxis. The patient was brought into the Emergency Department via EMS from home after she started having uncontrolled nose bleeding.  It started around noon. The patient takes Coumadin regularly. She tried Afrin nose spray 4 times but the bleeding did not stop. She does report having minimal blood when she blew her nose last week but did not have continued bleeding. While in the ED, she was tried on nasal packing by ED physician that her bleeding had not stopped and she was given 10 mg vitamin K intramuscular. At this point, we are waiting for ENT, Dr. Willeen CassBennett, who has just come to likely cauterized her bleeding vessel. She is being admitted for further evaluation and management.   PAST MEDICAL HISTORY: 1.  Atrial fibrillation, on Coumadin.   2.  History of PE.  3.  Depression. 4.  History of shingles with chronic pain.   ALLERGIES: PREDNISOLONE  SOCIAL HISTORY: No smoking. Occasional alcohol or illicit drug use.   FAMILY HISTORY: Positive for heart disease and stroke on both mother and father's sides of the family.    MEDICATIONS AT HOME: 1.  Aspirin 81 mg p.o. daily.  2.  Atorvastatin 10 mg p.o. at bedtime.  3.  Atrovent inhalation 4 times a day. 4.  Colchicine 0.6 mg p.o. daily.  5.  Cardizem 120 mg p.o. daily.  6.  Flecainide 100 mg p.o. b.i.d. 7.  Furosemide 40 mg p.o. daily. 8.  Metolazone 2.5 mg p.o. b.i.d.  9.  MiraLax once daily as needed.  10.  Multivitamin once daily.  11.  Oxycodone 5 mg 1 to 2 tablets p.o. every 4 to 6 hours as needed. 12.  Potassium chloride 10 p.o. 2  tablets b.i.d. 13.  Senna once daily. 14.  Venalfaxine 150 mg p.o. daily.  15.  Vitamin C once daily. 16.  Warfarin 2.5 mg p.o. at bedtime on Monday, Wednesday, Friday and 5 mg every day except Monday, Wednesday, Friday.  17.  Zinc sulfate 220 mg p.o. daily  18.  Zinc with vitamin A and C once daily.   REVIEW OF SYSTEMS: CONSTITUTIONAL: No fever, fatigue, weakness.  EYES: No blurred or double vision.   ENT: She is having uncontrolled nosebleed. She does have packing in place although she is still actively bleeding from both nostrils. No tinnitus or ear pain.  RESPIRATORY: No cough, wheezing, hemoptysis.  CARDIOVASCULAR: No chest pain, orthopnea, edema.  GASTROINTESTINAL: No nausea, vomiting or diarrhea.  GENITOURINARY:  No dysuria or hematuria.  ENDOCRINE: No polyuria, nocturia  HEMATOLOGIC:  No anemia or easy bruising. SKIN:  No skin rash or lesion.  MUSCULOSKELETAL: No arthritis or muscle cramp.  NEUROLOGIC: No tingling or numbness, weakness.  PSYCHIATRY: No history of anxiety, depression.   PHYSICAL EXAMINATION: VITAL SIGNS: Temperature 98, heart rate 108 per minute, respirations 22 per minute, blood pressure 123/65. She is saturating 99% on 2 liters oxygen via nasal cannula. She was saturating 87% on room air.  GENERAL: The patient is a 81 year old female lying in the bed comfortably without any acute distress.  EYES: Pupils equal, round and reactive to  light and accommodation. No scleral icterus. Extraocular muscles intact.  HEENT: Head atraumatic, normocephalic. Oropharynx clear. Nasopharynx: She seemed to have active bleeding from her right nostril. She does have some packing in place although blood is still pouring out of her right nostril and soaked her gown and upper chest region.  NECK:  Supple. No jugular venous distention.  No thyroid enlargement or tenderness. LUNGS: Clear to auscultation bilaterally. No wheezing, rales, rhonchi or crepitation.  CARDIOVASCULAR: S1, S2  normal. No murmurs, rubs, or gallop.  ABDOMEN: Soft, nontender, nondistended. Bowel sounds present. No organomegaly or mass.  EXTREMITIES: No pedal edema, cyanosis or clubbing.  NEUROLOGIC: Nonfocal examination. Cranial nerves II through XII intact. Muscle strength 5 out of 5 in all extremities. Sensation intact. The patient is alert and oriented x3. SKIN:  No obvious rash, lesion or ulcer.   LABORATORY, DIAGNOSTIC AND RADIOLOGICAL DATA: Sodium 131, potassium 3.1, BUN 60, creatinine 1.84, blood sugar 113.   Normal liver function tests. Normal CBC except white count of 14.2.  PT of 34.6, INR 3.6.   Chest x-ray in the ED showed no acute cardiopulmonary disease.   EKG is still not performed and is pending.   Blood gas showed pH of 7.47, pCO2 53, pO2 86, bicarbonate of 38.6.   IMPRESSION AND PLAN: 1.  Uncontrolled severe epistaxis. Will need ENT. Discussed with Dr. Willeen Cass who is heading to her home for likely cauterization. Dr. Willeen Cass is hoping to cauterize the vessel.  We will hold her Coumadin for now as she was already given 10 mg vitamin K intramuscularly. We will give another 10 mg p.o. as she was actively bleeding.  Just talking with Dr. Willeen Cass a few seconds ago. It looks like he was able to cauterize the vessel and the bleeding has stopped after packing also.  Will start her on Keflex for prophylaxis which needs to be continued until her nasal packing comes out, which according to Dr. Willeen Cass will be another 5 days. He can see her in the office next Monday.  2.  Acquired coagulopathy with INR 3.6 with active bleeding, reversing with vitamin K. We will consult Cardiology to evaluate the need for anticoagulation. Her pulmonary embolus was about at least 5 years ago. We will get an EKG to see if she is still in persistent atrial fibrillation or not.  3.  Chronic atrial fibrillation. We will get an EKG for further evaluation to see if she is still in persistent atrial fibrillation. Not a good  candidate for anticoagulation. Will get a Cardiology consult for further evaluation.  4.  Hyponatremia/hypokalemia. We will replete and recheck. Check magnesium in the morning.   CODE STATUS: FULL CODE.   Total time taking care of this patient: 55 minutes.    ____________________________ Ellamae Sia. Sherryll Burger, MD vss:dp D: 06/16/2013 16:27:05 ET T: 06/16/2013 17:09:34 ET JOB#: [id number]  cc: Hale Chalfin S. Sherryll Burger, MD, <Dictator> Ollen Gross. Willeen Cass, MD Excell Seltzer, MD  Patricia Pesa MD ELECTRONICALLY SIGNED 06/18/2013 14:51

## 2014-09-03 NOTE — Op Note (Signed)
PATIENT NAME:  [name] [name], [name] [name] MR#:  [id number] DATE OF BIRTH:  11-11-1931  DATE OF PROCEDURE:  02/01/2014  PREOPERATIVE DIAGNOSIS: Visually significant cataract of the left eye.   POSTOPERATIVE DIAGNOSIS: Visually significant cataract of the left eye.   OPERATIVE PROCEDURE: Cataract extraction by phacoemulsification with implant of intraocular lens to left eye.   SURGEON: Galen ManilaWilliam Aarron Wierzbicki, MD.   ANESTHESIA:  1. Managed anesthesia care.  2. Topical tetracaine drops followed by 2% Xylocaine jelly applied in the preoperative holding area.   COMPLICATIONS: None.   TECHNIQUE:  Stop and chop.   DESCRIPTION OF PROCEDURE: The patient was examined and consented in the preoperative holding area where the aforementioned topical anesthesia was applied to the left eye and then brought back to the Operating Room where the left eye was prepped and draped in the usual sterile ophthalmic fashion and a lid speculum was placed. A paracentesis was created with the side port blade and the anterior chamber was filled with viscoelastic. A near clear corneal incision was performed with the steel keratome. A continuous curvilinear capsulorrhexis was performed with a cystotome followed by the capsulorrhexis forceps. Hydrodissection and hydrodelineation were carried out with BSS on a blunt cannula. The lens was removed in a stop and chop technique and the remaining cortical material was removed with the irrigation-aspiration handpiece. The capsular bag was inflated with viscoelastic and the Alcon SN60WF lens, 23.5-diopter lens, serial number [id number] was placed in the capsular bag without complication. The remaining viscoelastic was removed from the eye with the irrigation-aspiration handpiece. The wounds were hydrated. The anterior chamber was flushed with Miostat and the eye was inflated to physiologic pressure. 0.1 mL of cefuroxime concentration 10 mg/mL was placed in the anterior chamber. The wounds were found to be  water tight. The eye was dressed with Vigamox. The patient was given protective glasses to wear throughout the day and a shield with which to sleep tonight. The patient was also given drops with which to begin a drop regimen today and will follow-up with me in one day.     ____________________________ Jerilee FieldWilliam L. Sherley Leser, MD wlp:LT D: 02/01/2014 21:08:00 ET [name]: 02/01/2014 21:45:38 ET JOB#: [id number]  cc: Kaileia Flow L. Jaleen Grupp, MD, <Dictator> Jerilee FieldWILLIAM L Treg Diemer MD ELECTRONICALLY SIGNED 02/02/2014 10:43

## 2014-09-03 NOTE — Op Note (Signed)
PATIENT NAME:  [name] [name], [name] [name] MR#:  [id number] DATE OF BIRTH:  1932-05-11  DATE OF PROCEDURE:  06/16/2013  PREOPERATIVE DIAGNOSIS:  Right-sided epistaxis.   POSTOPERATIVE DIAGNOSIS:  Right-sided epistaxis.  PROCEDURE:  Cauterization and packing for control of epistaxis.   SURGEON: Anthoney HaradaPaul Scott Simon Llamas, MD.   DESCRIPTION OF PROCEDURE: After discussing the procedure with the patient, the nose was inspected and bleeding noted from the anterior nasal septum. Silver nitrate was used to cauterize the area that was bleeding. The bleeding was actually controlled reasonably well with the silver nitrate with no active bleeding seen, but given her anticoagulated status and the severity of the nosebleed she experienced earlier, it was felt best to go ahead and placed a simple anterior pack to provide further assurance against rebleeding. This was placed, approximately a 3 cm Merocel pack, and expanded with Afrin. The patient's throat was inspected and there was no bleeding down the back of the throat after placement of the pack and cautery. There was no bleeding around the pack itself. The patient tolerated the procedure well.     ____________________________ Ollen GrossPaul S. Willeen CassBennett, MD psb:ce D: 06/16/2013 16:39:38 ET [name]: 06/16/2013 18:27:05 ET JOB#: [id number]  cc: Ollen GrossPaul S. Willeen CassBennett, MD, <Dictator> Sandi MealyPAUL S Beula Joyner MD ELECTRONICALLY SIGNED 06/23/2013 8:55

## 2014-09-09 ENCOUNTER — Telehealth: Payer: Self-pay | Admitting: *Deleted

## 2014-09-09 ENCOUNTER — Encounter: Payer: Self-pay | Admitting: Pulmonary Disease

## 2014-09-09 ENCOUNTER — Ambulatory Visit (INDEPENDENT_AMBULATORY_CARE_PROVIDER_SITE_OTHER): Payer: Medicare Other | Admitting: Pulmonary Disease

## 2014-09-09 VITALS — BP 146/84 | HR 79 | Resp 16

## 2014-09-09 DIAGNOSIS — I2699 Other pulmonary embolism without acute cor pulmonale: Secondary | ICD-10-CM

## 2014-09-09 DIAGNOSIS — J9611 Chronic respiratory failure with hypoxia: Secondary | ICD-10-CM

## 2014-09-09 DIAGNOSIS — J849 Interstitial pulmonary disease, unspecified: Secondary | ICD-10-CM | POA: Diagnosis not present

## 2014-09-09 DIAGNOSIS — I5032 Chronic diastolic (congestive) heart failure: Secondary | ICD-10-CM | POA: Diagnosis not present

## 2014-09-09 DIAGNOSIS — T81718A Complication of other artery following a procedure, not elsewhere classified, initial encounter: Secondary | ICD-10-CM

## 2014-09-09 MED ORDER — PREDNISONE 5 MG PO TABS: ORAL_TABLET | ORAL | Status: DC

## 2014-09-09 MED ORDER — IPRATROPIUM-ALBUTEROL 0.5-2.5 (3) MG/3ML IN SOLN: 3.0000 mL | Freq: Three times a day (TID) | RESPIRATORY_TRACT | Status: DC

## 2014-09-09 NOTE — Progress Notes (Signed)
Subjective:    Patient ID: [name] [name], female    DOB: December 05, 1931, 81 y.o.   MRN: [id number]  Synopsis: [name] is a 81 year old female who has a past medical history significant for congestive heart failure, pulmonary hypertension, pulmonary embolism, atrial fibrillation and obstructive sleep apnea. She previously been seen by Dr. Coralyn Helling for management of sleep apnea. She uses CPAP at 7 a 10 cm of water every night. At baseline she is on 2.5 L of oxygen continuously. 12/2012 CXR > Left lung scarring vs pneumonia  10/19 RHC >RA mean 3 RV 37/6 PA 37/15, mean 23 PCWP mean 7 Oxygen saturations: PA 75% AO 92% Cardiac Output(Fick) 8.94 Cardiac Index (Fick) 4.14 Cardiac Output (Thermo) 5.05 Cardiac Index (Thermo) 2.34  02/2014 CT chest high res> mild pulm edema, very mild scattered subpleural reticulation, but assessment limited by likely pulmonary edema; 4mm nodule 04/04/2014 CT high res [place]> patchy ground glass throughout with some subpleural reticulation, suspicious for NSIP, not UIP; mild air trapping, mild cardiomegally and atheroscloerosis  HPI Chief Complaint  Patient presents with  . Follow-up    pt c/o sob with any activity.  denies cough, chest congestion.     [name] [name]'s breathing is worse>  She went to the hospital in March after stopping prednisone per my instruction, couldn't keep her oxygen level up, couldn't breath, was put on  in ED at [facility], then saw Mungal and he put her down to .  Overall she isn't sure that the prednisone; she continues to be more short of breath and she has been continues to be on 5L O2 which is a big increase since I saw her.  No cough, some mucus in mornings.  Sinus drainage> minimal amount, takes zyrtec daily  Swelling has been bad> Dr. Mariah Milling gave her a new diuretic (metolazone) and her weight came down by 4 pounds; she is supposed to take it if her weight is > 295 lbs; goal weight is 260lbs.  Weight gain > she has gained 30 pounds since  starting prednisone in January  Past Medical History  Diagnosis Date  . Congestive heart failure, unspecified     a. ECHO 6/0: EF 55-60%, mild LVH, mildly dilated RV w. mildly dec fx, RVSP 67  . Chronic atrial fibrillation     failed cardioversion in past  -repeat DC-CV on October 5th, 2010  . Personal history of DVT (deep vein thrombosis) 2008  . Obstructive sleep apnea   . HTN (hypertension)   . Morbid obesity   . Shingles     with postherpetic neuraigia  . GERD (gastroesophageal reflux disease)   . Hyperlipidemia   . Meningitis 1950s  . Gallstones     s/p cholecystectomy  . CAD (coronary artery disease)     nonobstructive by cath 8/10 - LAD 40-50% w mild PAH mean 29 w PVR 3.2 [place]  . AAA (abdominal aortic aneurysm)     small  . PE (pulmonary embolism)     history of  . Anemia   . Shortness of breath dyspnea   . Chronic kidney disease     ckd stage 2  . Lung interstitial disease   . Hypoxemia       Review of Systems  Constitutional: Positive for fatigue. Negative for fever, chills and diaphoresis.  HENT: Negative for congestion, postnasal drip, rhinorrhea and sinus pressure.   Respiratory: Positive for cough and shortness of breath. Negative for choking, chest tightness and wheezing.   Cardiovascular: Positive for leg  swelling. Negative for chest pain.       Objective:   Physical Exam  Filed Vitals:   09/09/14 1343  BP: 146/84  Pulse: 79  Resp: 16  SpO2: 92%  6 L [place]   Gen: chronically ill appearing, wheelchair bound HENT: OP clear, TM's clear, neck supple PULM: Crackles basesB, normal percussion CV: RRR, no mgr, massive edema GI: BS+, soft, nontender Derm: no cyanosis or rash Psyche: normal mood and affect      12/2012 CXR > Left lung scarring vs pneumonia  10/19 RHC >RA mean 3 RV 37/6 PA 37/15, mean 23 PCWP mean 7 Oxygen saturations: PA 75% AO 92% Cardiac Output(Fick) 8.94 Cardiac Index (Fick) 4.14 Cardiac Output (Thermo) 5.05 Cardiac Index (Thermo)  2.34  02/2014 CT chest high res> mild pulm edema, very mild scattered subpleural reticulation, but assessment limited by likely pulmonary edema; 4mm nodule 07/2014 HRCT reviewed> persistent ggo bilaterally, mild pleural effusion, scattered interstitial thickening, no major change from prior study Cardiology [facility] as well as Dr. Courtney Paris notes form 2 weeks ago   Assessment & Plan:   Chronic hypoxemic respiratory failure Currently she is taking 5L at rest and 6 L with exertion, I agree with this plan   ILD (interstitial lung disease) [name] [name] has been a very difficult patient to manage with her severe hypoxemia. I believe that she has both congestive heart failure as well as an inflammatory interstitial lung disease of uncertain etiology. It's not clear to me that she has usual interstitial pneumonitis despite the fact that this would be the most common etiology in her demographic. She has refused anti-fibrotic therapy in the past. Because of the inflammatory nature and her perceived benefit from anti-inflammatory medication prednisone, we have and 10 you to treat her with prednisone in 2016. However, this has only lead to serious side effects with significant weight gain. Her oxygenation is now worse despite being on prednisone this year and it is not entirely clear to me that she has received benefit from this medicine. As I had stated and notes previously I am not comfortable treating her with a non-steroidal anti-inflammatory such as CellCept or Imuran because of her multiple comorbid illnesses and my lack of understanding of what is going on in her lungs.  Today I explained to her that I'm afraid that I have very few options. From my perspective I feel that we need to keep her on a low-dose of prednisone to keep her symptoms controlled but try to lower the dose of that she can lose weight in the setting of her congestive heart failure.  I offered a second opinion with  another physician and she has elected to see my partner Dr. Marchelle Gearing, our expert in interstitial lung disease.  Plan: Taper prednisone down to 10 mg daily and maintain at that dose Follow-up with my partner, an interstitial lung disease expert   DIASTOLIC HEART FAILURE, CHRONIC Her weight has been up more recently. The addition of metolazone has helped her. However, she still has difficulty with weight because of the prednisone. She should continue taking the diuretic regimen as prescribed until she obtains her goal weight of 262 pounds. We will attempt to facilitate this by weaning the prednisone down to a low dose.   EMBOLISM & INFARCTION, IATROGENIC PULMONARY Continue warfarin     Updated Medication List Outpatient Encounter Prescriptions as of 09/09/2014  Medication Sig  . allopurinol (ZYLOPRIM) 300 MG tablet Take 1 tablet (300 mg total) by mouth  daily.  . aspirin 81 MG tablet Take 81 mg by mouth daily.  [name]. atorvastatin (LIPITOR) 10 MG tablet Take 1 tablet (10 mg total) by mouth daily.  . cetirizine (ZYRTEC ALLERGY) 10 MG tablet Take 10 mg by mouth as needed for allergies.   [name]. colchicine 0.6 MG tablet Take 0.6 mg by mouth as needed (for flareup).   [name]. diltiazem (CARDIZEM CD) 240 MG 24 hr capsule Take 1 capsule (240 mg total) by mouth daily.  [name]. escitalopram (LEXAPRO) 20 MG tablet Take 1 tablet (20 mg total) by mouth daily.  . famotidine (PEPCID) 20 MG tablet Take 1 tablet (20 mg total) by mouth daily.  . ferrous sulfate 325 (65 FE) MG tablet Take 1 tablet (325 mg total) by mouth 2 (two) times daily with a meal.  . gabapentin (NEURONTIN) 300 MG capsule Take 1 capsule (300 mg total) by mouth daily.  . metolazone (ZAROXOLYN) 5 MG tablet Take 1 tablet (5 mg total) by mouth daily as needed.  [name]. OVER THE COUNTER MEDICATION Inhale 1 application into the lungs at bedtime. USES BIPAP EVERY BEDTIME FOR SLEEP  . potassium chloride SA (K-DUR,KLOR-CON) 20 MEQ tablet Take 20 mEq by mouth daily.  [name].  torsemide (DEMADEX) 20 MG tablet Take 3 tablets (60 mg total) by mouth 2 (two) times daily.  [name]. warfarin (COUMADIN) 2.5 MG tablet Take as directed. (Patient taking differently: Take 2.5-3.75 mg by mouth daily at 6 PM. Takes 1.5 tabs on Tues, Thurs and Sun Takes 1 tab all other days)  . [DISCONTINUED] budesonide (PULMICORT) 0.5 MG/2ML nebulizer solution Take 2 mLs (0.5 mg total) by nebulization 2 (two) times daily.  . [DISCONTINUED] levalbuterol (XOPENEX) 0.63 MG/3ML nebulizer solution Take 3 mLs (0.63 mg total) by nebulization 4 (four) times daily. (Patient taking differently: Take 0.63 mg by nebulization 2 (two) times daily. )  . [DISCONTINUED] predniSONE (DELTASONE) 20 MG tablet Take 2 tablets (40 mg total) by mouth daily with breakfast. (Patient taking differently: Take 30 mg by mouth daily with breakfast. )  . ipratropium-albuterol (DUONEB) 0.5-2.5 (3) MG/3ML SOLN Take 3 mLs by nebulization 4 (four) times daily - after meals and at bedtime.  . predniSONE (DELTASONE) 5 MG tablet Take 25mg  daily for a week, then 20mg  daily for a week, then 15mg  daily for a week, then 10mg  daily until you see us next

## 2014-09-09 NOTE — Telephone Encounter (Signed)
Ok for verbal order  °

## 2014-09-09 NOTE — Patient Instructions (Signed)
Prednisone: Take 25mg  daily for a week, then 20mg  daily for a week, then 15mg  daily for a week, then 10mg  daily until you see us next  Stop taking the nebulized medicines you are taking now  Start taking duo neb 4 times a day  We will have you come see my partner who is an interstitial disease specialist, Dr. Marchelle Gearingamaswamy

## 2014-09-09 NOTE — Assessment & Plan Note (Signed)
Her weight has been up more recently. The addition of metolazone has helped her. However, she still has difficulty with weight because of the prednisone. She should continue taking the diuretic regimen as prescribed until she obtains her goal weight of 262 pounds. We will attempt to facilitate this by weaning the prednisone down to a low dose.

## 2014-09-09 NOTE — Telephone Encounter (Signed)
[facility] [place] [facility] to [facility] PT from this week to next since [name] is not feeling well.

## 2014-09-09 NOTE — Telephone Encounter (Signed)
Call from [facility] requesting a verbal order to to move therapy from this week to next.  The patient is not feeling well today.  Okay to give verbal order?

## 2014-09-09 NOTE — Assessment & Plan Note (Signed)
Currently she is taking 5L at rest and 6 L with exertion, I agree with this plan

## 2014-09-09 NOTE — Assessment & Plan Note (Signed)
[name] [name] has been a very difficult patient to manage with her severe hypoxemia. I believe that she has both congestive heart failure as well as an inflammatory interstitial lung disease of uncertain etiology. It's not clear to me that she has usual interstitial pneumonitis despite the fact that this would be the most common etiology in her demographic. She has refused anti-fibrotic therapy in the past. Because of the inflammatory nature and her perceived benefit from anti-inflammatory medication prednisone, we have and 10 you to treat her with prednisone in 2016. However, this has only lead to serious side effects with significant weight gain. Her oxygenation is now worse despite being on prednisone this year and it is not entirely clear to me that she has received benefit from this medicine. As I had stated and notes previously I am not comfortable treating her with a non-steroidal anti-inflammatory such as CellCept or Imuran because of her multiple comorbid illnesses and my lack of understanding of what is going on in her lungs.  Today I explained to her that I'm afraid that I have very few options. From my perspective I feel that we need to keep her on a low-dose of prednisone to keep her symptoms controlled but try to lower the dose of that she can lose weight in the setting of her congestive heart failure.  I offered a second opinion with another physician and she has elected to see my partner Dr. Marchelle Gearingamaswamy, our expert in interstitial lung disease.  Plan: Taper prednisone down to 10 mg daily and maintain at that dose Follow-up with my partner, an interstitial lung disease expert

## 2014-09-09 NOTE — Assessment & Plan Note (Signed)
Continue warfarin.

## 2014-09-13 ENCOUNTER — Telehealth: Payer: Self-pay | Admitting: Family Medicine

## 2014-09-13 NOTE — Telephone Encounter (Signed)
[name] [name] @ [facility] called with pt/inr results  Pt  34.2 inr 2.8

## 2014-09-13 NOTE — Telephone Encounter (Signed)
Stable control. Re-eval in 2 weeks given history.

## 2014-09-13 NOTE — Telephone Encounter (Signed)
[name] have [name] [name] continue current dose of warfarin and recheck PT/INR in two weeks.

## 2014-09-22 ENCOUNTER — Telehealth: Payer: Self-pay | Admitting: Pulmonary Disease

## 2014-09-22 NOTE — Telephone Encounter (Signed)
[phone], pt cb

## 2014-09-22 NOTE — Telephone Encounter (Signed)
lmomtcb x1 for Lynn 

## 2014-09-22 NOTE — Telephone Encounter (Signed)
Spoke with Nash[employer]Finch CompanyLynn. States that pt is taking 20mg  of Prednisone. She is having more SOB while resting. Wants pt to be put on 30mg  prednisone daily. Pt is not having any coughing or wheezing.  BQ - please advise. Thanks.

## 2014-09-23 NOTE — Telephone Encounter (Signed)
Spoke with Lynn(hh nurse)- aware of rec's per BQ Nothing further needed.

## 2014-09-23 NOTE — Telephone Encounter (Signed)
I'm okay with that but she needs to keep a close eye on her weight because this has made her go into congestive heart failure in the past. Tell her to keep the appointment with Dr. Marchelle Gearingamaswamy.

## 2014-09-29 ENCOUNTER — Telehealth: Payer: Self-pay | Admitting: *Deleted

## 2014-09-29 NOTE — Telephone Encounter (Signed)
[facility] current dose of warfarin and recheck PT/INR in 2 weeks.

## 2014-09-29 NOTE — Telephone Encounter (Signed)
Larita FifeLynn with [facility] called: PT= 34.1 INR= 2.8 No missed doses, no abx, no abnormal bleeding or bruising. + prednisone 2.5 mg M,W,F,Sat 3.75 other days  [phone]-leave message if no answer

## 2014-09-29 NOTE — Telephone Encounter (Signed)
Continue current dose recheck in 2 weeks 

## 2014-10-04 ENCOUNTER — Encounter: Payer: Self-pay | Admitting: Cardiovascular Disease

## 2014-10-04 ENCOUNTER — Other Ambulatory Visit: Payer: Self-pay | Admitting: *Deleted

## 2014-10-04 ENCOUNTER — Ambulatory Visit (INDEPENDENT_AMBULATORY_CARE_PROVIDER_SITE_OTHER): Payer: Medicare Other | Admitting: Cardiovascular Disease

## 2014-10-04 VITALS — BP 147/76 | HR 81 | Ht 64.0 in | Wt 292.5 lb

## 2014-10-04 DIAGNOSIS — R0602 Shortness of breath: Secondary | ICD-10-CM

## 2014-10-04 DIAGNOSIS — R609 Edema, unspecified: Secondary | ICD-10-CM | POA: Diagnosis not present

## 2014-10-04 DIAGNOSIS — I10 Essential (primary) hypertension: Secondary | ICD-10-CM

## 2014-10-04 DIAGNOSIS — J849 Interstitial pulmonary disease, unspecified: Secondary | ICD-10-CM | POA: Diagnosis not present

## 2014-10-04 DIAGNOSIS — M7989 Other specified soft tissue disorders: Secondary | ICD-10-CM

## 2014-10-04 DIAGNOSIS — I5032 Chronic diastolic (congestive) heart failure: Secondary | ICD-10-CM | POA: Diagnosis not present

## 2014-10-04 MED ORDER — WARFARIN SODIUM 2.5 MG PO TABS: ORAL_TABLET | ORAL | Status: DC

## 2014-10-04 NOTE — Assessment & Plan Note (Signed)
Lower extremity edema is minimal on today's visit. Recommended Ace wraps, leg elevation. Likely exacerbated by weight gain and prednisone

## 2014-10-04 NOTE — Assessment & Plan Note (Signed)
Currently on steroidal 30 mg daily, oxygen 5 L. Seems to a very dependent on her anti-inflammatory regimen. Weight has been climbing at a tremendous rate which likely does not help with her symptoms. [facility]

## 2014-10-04 NOTE — Progress Notes (Signed)
Patient ID: [name] [name], female    DOB: 05/05/1932, 81 y.o.   MRN: [id number]  HPI Comments: 81 y/o woman with h/o morbid obesity, sleep apnea and obesity hypoventilation syndrome, atrial fib, previously on flecainide, DVT on the left and PE in 2007 on warfarin, chronic diastolic HF, chronic respiratory distress secondary to ILD/pulmonary fibrosis, on chronic prednisone, nonobstructive CAD by cath 8/10 and small AAA, history of chronic leg edema,  She presents for routine followup of her diastolic CHF   In follow-up today, she reports continued shortness of breath, leg edema She is on 5 L oxygen for the past 6 months or so Prednisone dose increased from 15 mg to 30 mg for worsening shortness of breath She's pays $500 for her nebulizer.  She is relatively immobile She took metolazone recently for weight 295 pounds. This occurred last Saturday. She reports that she only dropped 1 pound. She's been taking torsemide 60 mg twice a day. Continued weight gain from her prednisone which has been tremendous Previous lab work March 2016 showing mild dehydration from aggressive diuresis No recent BMP since that time  Other past medical history Previous hospitalization, she was treated with aggressive diuresis with 5 L out using IV Lasix. She had climb in her creatinine and BUN, Discharged on torsemide 40 mg twice a day Since then she has had mild weight gain, continues to have leg swelling on the right, no significant change in the left lower extremity She reports having skin breakdown on the right with ulceration, blistering, oozing She has not been having her leg wrapped for some time. Symptoms worse now that her legs are hanging down, better in the hospital   hospitalization 02/18/2014 with 2 weeks in the hospital, then 2 weeks in rehabilitation, discharge 03/21/2014. Initial echocardiogram on arrival to the hospital showed severe pulmonary hypertension. She had aggressive diuresis for 5 days then  right heart catheterization showing essentially normal right heart pressures. She was discharged to rehabilitation with weight 255 pounds, down from 267 pounds per the patient. \On discharge from the rehabilitation, she was 281 pounds. She was not on a diuretic through the 2 weeks. She call our office after discharge with worsening lower extremity edema and shortness of breath. She was started on torsemide 40 g twice a day. Weight is now 263 pounds at home. Leg edema has resolved, she feels well overall with minimal shortness of breath. She states that weight has been stable on this torsemide 40 mg twice a day dosing. Recent basic metabolic panel reviewed with her showing potassium 4.9, essentially normal renal function  Chronic wound healing issues of her right lower extremity .   Other past medical history  in the hospital 08/31/2013. heart failure symptoms, had aggressive diuresis in the hospital with 15 pound weight drop.  Over the past several months her creatinine has slowly climbed from her baseline 1.2, up to 1.6, 1.8   Previous episode of syncope/loss of consciousness in February 2014 on Valentine's Day. Creatinine at that time was 1.7 .She was given IV fluids. secondary to a huge hematoma of her right leg, she has had months of wound evaluation. Not a candidate for skin grafting. She has a leg wrap on the right. No swelling of her left lower extremity, only the right. Region of skin loss on the right is extensive. discharge 07/27/2012   hospitalization 07/14/2013 for epistaxis. INR was 3.7. She had her nose packed and cauterized. Cardiology evaluated the patient on 06/17/2013. Flecainide and diltiazem  was continued. She was discharged on 06/18/2013 Lab work in the hospital 06/16/2013 showed creatinine 1.84, BUN 60, potassium 3.1  Echocardiogram in the hospital 06/27/2012 was essentially normal, mildly dilated left atrium Echocardiogram October 2015 with normal ejection fraction, severe  pulmonary hypertension   No Known Allergies  Outpatient Encounter Prescriptions as of 10/04/2014  Medication Sig  . allopurinol (ZYLOPRIM) 300 MG tablet Take 1 tablet (300 mg total) by mouth daily.  [name] aspirin 81 MG tablet Take 81 mg by mouth daily.  [name] atorvastatin (LIPITOR) 10 MG tablet Take 1 tablet (10 mg total) by mouth daily.  . budesonide (PULMICORT) 0.25 MG/2ML nebulizer solution Take 0.25 mg by nebulization 2 (two) times daily.  . cetirizine (ZYRTEC ALLERGY) 10 MG tablet Take 10 mg by mouth as needed for allergies.   [name] colchicine 0.6 MG tablet Take 0.6 mg by mouth as needed (for flareup).   [name] diltiazem (CARDIZEM CD) 240 MG 24 hr capsule Take 1 capsule (240 mg total) by mouth daily.  [name] escitalopram (LEXAPRO) 20 MG tablet Take 1 tablet (20 mg total) by mouth daily.  . famotidine (PEPCID) 20 MG tablet Take 1 tablet (20 mg total) by mouth daily.  . ferrous sulfate 325 (65 FE) MG tablet Take 1 tablet (325 mg total) by mouth 2 (two) times daily with a meal.  . gabapentin (NEURONTIN) 300 MG capsule Take 1 capsule (300 mg total) by mouth daily.  . metolazone (ZAROXOLYN) 5 MG tablet Take 1 tablet (5 mg total) by mouth daily as needed.  [name] OVER THE COUNTER MEDICATION Inhale 1 application into the lungs at bedtime. USES BIPAP EVERY BEDTIME FOR SLEEP  . potassium chloride SA (K-DUR,KLOR-CON) 20 MEQ tablet Take 20 mEq by mouth daily.  . predniSONE (DELTASONE) 10 MG tablet Take 30 mg by mouth daily with breakfast.  . torsemide (DEMADEX) 20 MG tablet Take 3 tablets (60 mg total) by mouth 2 (two) times daily.  [name] warfarin (COUMADIN) 2.5 MG tablet Take as directed.  . [DISCONTINUED] ipratropium-albuterol (DUONEB) 0.5-2.5 (3) MG/3ML SOLN Take 3 mLs by nebulization 4 (four) times daily - after meals and at bedtime. (Patient not taking: Reported on 10/04/2014)  . [DISCONTINUED] predniSONE (DELTASONE) 5 MG tablet Take  daily for a week, then  daily for a week, then  daily for a week, then   daily until you see [place] next (Patient not taking: Reported on 10/04/2014)   No facility-administered encounter medications on file as of 10/04/2014.    Past Medical History  Diagnosis Date  . Congestive heart failure, unspecified     a. ECHO 6/0: EF 55-60%, mild LVH, mildly dilated RV w. mildly dec fx, RVSP 67  . Chronic atrial fibrillation     failed cardioversion in past  -repeat DC-CV on October 5th, 2010  . Personal history of DVT (deep vein thrombosis) 2008  . Obstructive sleep apnea   . HTN (hypertension)   . Morbid obesity   . Shingles     with postherpetic neuraigia  . GERD (gastroesophageal reflux disease)   . Hyperlipidemia   . Meningitis 1950s  . Gallstones     s/p cholecystectomy  . CAD (coronary artery disease)     nonobstructive by cath 8/10 - LAD 40-50% w mild PAH mean 29 w PVR 3.2 [place]  . AAA (abdominal aortic aneurysm)     small  . PE (pulmonary embolism)     history of  . Anemia   . Shortness of breath dyspnea   .  Chronic kidney disease     ckd stage 2  . Lung interstitial disease   . Hypoxemia     Past Surgical History  Procedure Laterality Date  . Appendectomy  1947  . Tonsillectomy  1946  . Hospitalized with meningitis at [facility]  1960  . Cholecystectomy    . Cardioversion  01/2007    x2  . Right heart catheterization N/A 02/28/2014    Procedure: RIGHT HEART CATH;  Surgeon: Laurey Morale, MD;  Location: [facility] CATH LAB;  Service: Cardiovascular;  Laterality: N/A;    Social History  reports that she has never smoked. She has never used smokeless tobacco. She reports that she drinks alcohol. She reports that she does not use illicit drugs.  Family History family history includes Depression in her mother; Heart disease in her paternal grandfather; Heart failure in her mother; Obesity in her other; Ovarian cancer in her paternal grandmother.   Review of Systems  Constitutional: Positive for unexpected weight change.  Respiratory: Positive for  shortness of breath.   Cardiovascular: Positive for leg swelling.  Gastrointestinal: Negative.   Musculoskeletal: Positive for gait problem.  Skin: Positive for wound.       Right lower extremity, bandage in place  Neurological: Negative.   Hematological: Negative.   Psychiatric/Behavioral: Negative.   All other systems reviewed and are negative.   BP 147/76 mmHg  Pulse 81  Ht  (1.626 m)  Wt 292 lb 8 oz (132.677 kg)  BMI 50.18 kg/m2  Physical Exam  Constitutional: She is oriented to person, place, and time. She appears well-developed and well-nourished.  Obese, presenting in a wheelchair  HENT:  Head: Normocephalic.  Nose: Nose normal.  Mouth/Throat: Oropharynx is clear and moist.  Eyes: Conjunctivae are normal. Pupils are equal, round, and reactive to light.  Neck: Normal range of motion. Neck supple. No JVD present.  Cardiovascular: Normal rate, regular rhythm, S1 normal, S2 normal, normal heart sounds and intact distal pulses.  Exam reveals no gallop and no friction rub.   No murmur heard. Minimal leg edema on the left lower extremity, trace pitting edema right lower extremity ankle, several blisters right shin, side of wound healing, small ulceration noted right shin, less than size of a quarter  Pulmonary/Chest: Effort normal. No respiratory distress. She has no wheezes. She has rales. She exhibits no tenderness.  Rales one half way up lung fields bilaterally  Abdominal: Soft. Bowel sounds are normal. She exhibits no distension. There is no tenderness.  Musculoskeletal: Normal range of motion. She exhibits edema. She exhibits no tenderness.  Lymphadenopathy:    She has no cervical adenopathy.  Neurological: She is alert and oriented to person, place, and time. Coordination normal.  Skin: Skin is warm and dry. No rash noted. No erythema.  Psychiatric: She has a normal mood and affect. Her behavior is normal. Judgment and thought content normal.    Assessment and Plan   Nursing note and vitals reviewed.

## 2014-10-04 NOTE — Assessment & Plan Note (Signed)
We have ordered a BMP for today. Suggested she continue torsemide 60 mg twice a day Suggested she try to refrain from using metolazone. I suspect her weight gain is secondary to prednisone. She has typically run mildly dry only torsemide 60 minute grams twice a day. Little room for additional diuresis. We have recommended that if BUN and creatinine climbed further, we may need to adjust her torsemide down further. We have recommended Ace wraps for her legs.

## 2014-10-04 NOTE — Patient Instructions (Signed)
You are doing well. No medication changes were made.  We will check blood work today to help guide your diuretic regimen  Please call us if you have new issues that need to be addressed before your next appt.  Your physician wants you to follow-up in: 3 months.  You will receive a reminder letter in the mail two months in advance. If you don't receive a letter, please call our office to schedule the follow-up appointment.

## 2014-10-04 NOTE — Assessment & Plan Note (Signed)
Blood pressure is well controlled on today's visit. No changes made to the medications. 

## 2014-10-04 NOTE — Assessment & Plan Note (Signed)
Little room to maneuver here as prednisone has contributed to massive weight gain. She is unable to exercise

## 2014-10-05 ENCOUNTER — Ambulatory Visit (INDEPENDENT_AMBULATORY_CARE_PROVIDER_SITE_OTHER): Payer: Medicare Other | Admitting: Internal Medicine

## 2014-10-05 ENCOUNTER — Telehealth: Payer: Self-pay | Admitting: Internal Medicine

## 2014-10-05 ENCOUNTER — Encounter: Payer: Self-pay | Admitting: Internal Medicine

## 2014-10-05 VITALS — BP 126/70 | HR 82 | Ht 64.0 in | Wt 292.0 lb

## 2014-10-05 DIAGNOSIS — J9611 Chronic respiratory failure with hypoxia: Secondary | ICD-10-CM | POA: Diagnosis not present

## 2014-10-05 DIAGNOSIS — T380X5S Adverse effect of glucocorticoids and synthetic analogues, sequela: Secondary | ICD-10-CM

## 2014-10-05 DIAGNOSIS — J849 Interstitial pulmonary disease, unspecified: Secondary | ICD-10-CM | POA: Diagnosis not present

## 2014-10-05 DIAGNOSIS — T380X5A Adverse effect of glucocorticoids and synthetic analogues, initial encounter: Secondary | ICD-10-CM | POA: Insufficient documentation

## 2014-10-05 LAB — BASIC METABOLIC PANEL
BUN/Creatinine Ratio: 32 — ABNORMAL HIGH (ref 11–26)
BUN: 61 mg/dL — ABNORMAL HIGH (ref 8–27)
CO2: 32 mmol/L — ABNORMAL HIGH (ref 18–29)
Calcium: 10.5 mg/dL — ABNORMAL HIGH (ref 8.7–10.3)
Chloride: 85 mmol/L — ABNORMAL LOW (ref 97–108)
Creatinine, Ser: 1.89 mg/dL — ABNORMAL HIGH (ref 0.57–1.00)
GFR, EST AFRICAN AMERICAN: 28 mL/min/{1.73_m2} — AB (ref >59)
GFR, EST NON AFRICAN AMERICAN: 25 mL/min/{1.73_m2} — AB (ref >59)
Glucose: 159 mg/dL — ABNORMAL HIGH (ref 65–99)
Potassium: 4.6 mmol/L (ref 3.5–5.2)
Sodium: 140 mmol/L (ref 134–144)

## 2014-10-05 NOTE — Progress Notes (Signed)
Subjective:    Patient ID: [name] [name], female    DOB: 1932/05/12, 81 y.o.   MRN: [id number]  HPI    OV 10/05/2014  Chief Complaint  Patient presents with  . Follow-up    Referred by BQ for ILD work-up. Pt is on 5 L cont. 02. Pt had bloodwork done yesterday at cardiology and would like to know kidney levels.    81 year old obese female referred by Dr. Kendrick Fries for second opinion on interstitial lung disease. I have seen this patient once in March 2016 the hospital based on my review of the old chart. History is currently her pain from review of Dr. Ulyses Jarred notes, my own notes in March 2016 and other hospital charts and from the patient's sister. This is a morbidly obese female with diastolic heart failure multiple medical problems  It appears she was diagnosed with interstitial lung disease in early part of 2016. The CT scan of the chest was never fully diagnostic of UIP (personally reviewed an old scan from 2008 and this does show evidence of early ILD with subpleural bibasal radicular abnormalities without any honeycombing). In December 2015 she had an extensive autoimmune panel that was trace positive for ANA and slightly positive for SSA Sjogren's antibody at a level of 3.3. Otherwise anti-Jo antibody, anticentromere antibody, rheumatoid factor SSB and SCL 71 negative. Also negative was hypersensitivity pneumonitis panel.  Due to multiple medical problems and associated diastolic heart failure surgical lung biopsy was rightfully deferred. She was treated with empiric prednisone. At onset she was on 2 L oxygen according to her history. And according to her history despite prednisone she has continued to worsen and is currently on 5-6 liters of oxygen. In the interim in March 2016 she even had her admission for acute on chronic respiratory failure requiring 15 L oxygen for which she was significantly diuresed for worsening diastolic heart failure I saw her at this time for 2 days and she  does not remove this admission].   Overall she believes that her prednisone has not helped her. She's gained 30 pounds of weight. She is also describing cushingoid syndrome. Specifically she states that "I do not want to be buried in fact coffin". Yet at the same time she also states that when prednisone is tapered she gets worse and when it is bumped up she feels a little bit better.  Currently she is living with someone. She gets around the house with a walker and a oxygen system looked along oxygen cords. We discussed the goals of care: She is fully expressing interest in therapy to slow down the progression of disease or potentially reverse it. She is not interested in a total palliative or symptom-based approach believing that she's not fully ready for it but she is open to listening to such options.  Radiological review - personally visualized all images - CT scan of the chest 2008: Suggestion of early subpleural bibasilar reticular abnormalities very mild. No honeycombing - October 2015 and March 2016 CT chest:: Similar ILD findings but more overt. Hard to diescribe pattern because of bilateral pleural effusions  Autoimune  - trace posiive SSA, ANA but otherwise negative   Exposure hx  - not elicited  Labs Creatinine is worse than a few months ago  Recent Labs Lab 10/04/14 1618  NA 140  K 4.6  CL 85*  CO2 32*  GLUCOSE 159*  BUN 61*  CREATININE 1.89*  CALCIUM 10.5*   Estimated Creatinine Clearance: 32.2 mL/min (by  C-G formula based on Cr of 1.89).     has a past medical history of Congestive heart failure, unspecified; Chronic atrial fibrillation; Personal history of DVT (deep vein thrombosis) (2008); Obstructive sleep apnea; HTN (hypertension); Morbid obesity; Shingles; GERD (gastroesophageal reflux disease); Hyperlipidemia; Meningitis (1950s); Gallstones; CAD (coronary artery disease); AAA (abdominal aortic aneurysm); PE (pulmonary embolism); Anemia; Shortness of breath  dyspnea; Chronic kidney disease; Lung interstitial disease; and Hypoxemia.   reports that she has never smoked. She has never used smokeless tobacco.  Past Surgical History  Procedure Laterality Date  . Appendectomy  1947  . Tonsillectomy  1946  . Hospitalized with meningitis at [facility]  1960  . Cholecystectomy    . Cardioversion  01/2007    x2  . Right heart catheterization N/A 02/28/2014    Procedure: RIGHT HEART CATH;  Surgeon: Laurey Morale, MD;  Location[facility] CATH LAB;  Service: Cardiovascular;  Laterality: N/A;    No Known Allergies  Immunization History  Administered Date(s) Administered  . Influenza Split 02/01/2011, 02/13/2012  . Influenza Whole 03/03/2007, 03/09/2008, 01/19/2010  . Influenza,inj,Quad PF,36+ Mos 01/15/2013  . Influenza-Unspecified 02/11/2014  . Pneumococcal Conjugate-13 04/12/2014  . Pneumococcal Polysaccharide-23 03/09/2008  . Td 03/09/2008  . Zoster 07/13/2010    Family History  Problem Relation Age of Onset  . Heart failure Mother   . Depression Mother   . Ovarian cancer Paternal Grandmother   . Heart disease Paternal Grandfather   . Obesity Other      Current outpatient prescriptions:  .  allopurinol (ZYLOPRIM) 300 MG tablet, Take 1 tablet (300 mg total) by mouth daily., Disp: 30 tablet, Rfl: 11 .  aspirin 81 MG tablet, Take 81 mg by mouth daily., Disp: , Rfl:  .  atorvastatin (LIPITOR) 10 MG tablet, Take 1 tablet (10 mg total) by mouth daily., Disp: 30 tablet, Rfl: 5 .  budesonide (PULMICORT) 0.25 MG/2ML nebulizer solution, Take 0.25 mg by nebulization 2 (two) times daily., Disp: , Rfl:  .  cetirizine (ZYRTEC ALLERGY) 10 MG tablet, Take 10 mg by mouth as needed for allergies. , Disp: , Rfl:  .  colchicine 0.6 MG tablet, Take 0.6 mg by mouth as needed (for flareup). , Disp: , Rfl:  .  diltiazem (CARDIZEM CD) 240 MG 24 hr capsule, Take 1 capsule (240 mg total) by mouth daily., Disp: 30 capsule, Rfl: 0 .  escitalopram (LEXAPRO) 20 MG  tablet, Take 1 tablet (20 mg total) by mouth daily., Disp: 30 tablet, Rfl: 5 .  famotidine (PEPCID) 20 MG tablet, Take 1 tablet (20 mg total) by mouth daily., Disp: 30 tablet, Rfl: 6 .  ferrous sulfate 325 (65 FE) MG tablet, Take 1 tablet (325 mg total) by mouth 2 (two) times daily with a meal., Disp: 60 tablet, Rfl: 0 .  gabapentin (NEURONTIN) 300 MG capsule, Take 1 capsule (300 mg total) by mouth daily., Disp: 90 capsule, Rfl: 0 .  metolazone (ZAROXOLYN) 5 MG tablet, Take 1 tablet (5 mg total) by mouth daily as needed., Disp: 30 tablet, Rfl: 1 .  OVER THE COUNTER MEDICATION, Inhale 1 application into the lungs at bedtime. USES BIPAP EVERY BEDTIME FOR SLEEP, Disp: , Rfl:  .  potassium chloride SA (K-DUR,KLOR-CON) 20 MEQ tablet, Take 20 mEq by mouth daily., Disp: , Rfl:  .  torsemide (DEMADEX) 20 MG tablet, Take 3 tablets (60 mg total) by mouth 2 (two) times daily., Disp: 180 tablet, Rfl: 6 .  warfarin (COUMADIN) 2.5 MG tablet, Take as  directed., Disp: 90 tablet, Rfl: 0 .  predniSONE (DELTASONE) 5 MG tablet, Take 30 mg by mouth daily., Disp: , Rfl:     Review of Systems  Constitutional: Positive for unexpected weight change. Negative for fever, chills, diaphoresis and appetite change.  HENT: Positive for sore throat. Negative for congestion, dental problem, ear pain, nosebleeds, postnasal drip, rhinorrhea, sinus pressure, sneezing and trouble swallowing.   Eyes: Negative for redness and itching.  Respiratory: Positive for cough and shortness of breath. Negative for chest tightness and wheezing.   Cardiovascular: Positive for chest pain. Negative for palpitations and leg swelling.  Gastrointestinal: Negative for nausea, vomiting, abdominal pain, diarrhea, abdominal distention and anal bleeding.  Endocrine: Negative for cold intolerance, polydipsia and polyuria.  Genitourinary: Negative for dysuria, frequency, flank pain, decreased urine volume, vaginal bleeding, enuresis, difficulty urinating,  genital sores, vaginal pain, menstrual problem and pelvic pain.  Musculoskeletal: Positive for arthralgias. Negative for myalgias, back pain, joint swelling, gait problem, neck pain and neck stiffness.  Skin: Negative.  Negative for rash.  Allergic/Immunologic: Negative for environmental allergies, food allergies and immunocompromised state.  Neurological: Negative for tremors, seizures, syncope, speech difficulty, weakness, light-headedness, numbness and headaches.  Hematological: Negative for adenopathy. Does not bruise/bleed easily.  Psychiatric/Behavioral: Negative for behavioral problems, confusion, dysphoric mood and agitation. The patient is not nervous/anxious.        Objective:   Physical Exam  Constitutional: She is oriented to person, place, and time. She appears well-developed and well-nourished. No distress.  Morbidly obesity and a wheelchair  HENT:  Head: Normocephalic and atraumatic.  Right Ear: External ear normal.  Left Ear: External ear normal.  Mouth/Throat: Oropharynx is clear and moist. No oropharyngeal exudate.  Cushingoid Oxygen on  Eyes: Conjunctivae and EOM are normal. Pupils are equal, round, and reactive to light. Right eye exhibits no discharge. Left eye exhibits no discharge. No scleral icterus.  Neck: Normal range of motion. Neck supple. No JVD present. No tracheal deviation present. No thyromegaly present.  Cardiovascular: Normal rate, regular rhythm, normal heart sounds and intact distal pulses.  Exam reveals no gallop and no friction rub.   No murmur heard. Pulmonary/Chest: Effort normal. No respiratory distress. She has no wheezes. She has rales. She exhibits no tenderness.  Bibasal crackles present  Abdominal: Soft. Bowel sounds are normal. She exhibits no distension and no mass. There is no tenderness. There is no rebound and no guarding.  Musculoskeletal: Normal range of motion. She exhibits edema. She exhibits no tenderness.  Looks  deconditioned Chronic 3+ venous stasis edema present  Lymphadenopathy:    She has no cervical adenopathy.  Neurological: She is alert and oriented to person, place, and time. She has normal reflexes. No cranial nerve deficit. She exhibits normal muscle tone. Coordination normal.  Skin: Skin is warm and dry. No rash noted. She is not diaphoretic. No erythema. No pallor.  Psychiatric: She has a normal mood and affect. Her behavior is normal. Judgment and thought content normal.  Vitals reviewed.   Filed Vitals:   10/05/14 1522  BP: 126/70  Pulse: 82  Height:  (1.626 m)  Weight: 292 lb (132.45 kg)  SpO2: 92%         Assessment & Plan:     ICD-9-CM ICD-10-CM   1. ILD (interstitial lung disease) 515 J84.9   2. Chronic hypoxemic respiratory failure 518.83 J96.11    799.02    3. Steroid toxicity, sequela 909.5 T38.0X5S    E932.0     1.  Chronic hypoxemic respiratory failure: This is due to diastolic heart failure and interstitial lung disease.  1. Interstitial lung disease: The autoimmune antibodies of ANA and SSA only trace positive and her courses been pretty aggressive since January 2016 despite prednisone. In addition back in 2008 she did have some subtle interstitial lung disease of normality is on the CT chest. Given age 28 and the above I think idiopathic pulmonary fibrosis is the most likely diagnosis but he clearly given the rapid course. My clinical experience in [place] is that the vast number of biopsies done on patient's in this age group in the setting of CT scan not consistent with UIP turn out to be indeed UIP. Other possibilities hypersensitivity pneumonitis but I think this would be unusual given the lower lobe predominance of the disease.  Currently her quality of life is miserable and partly contributing by prednisone. Therefore regardless of potential benefit from prednisone [I highly doubt that prednisone has been beneficial in fact she is continued to worsen  despite prednisone] given the side effects and the poor quality of life she perceives from prednisone I recommend slowly tapering this and stopping it ultimately. Therefore recommended her drop prednisone to 20mg   once daily. Ultimately wean off prednisone is my rec .  We discussed the goals of care: She still wants to pursue an option of trying to slow the progression of pulmonary fibrosis. I do not think CellCept for presumed are theoretically possible hypersensitive pneumonitis or NSIP is realistic given the side effect profile and lack of good evidence of benefit in this situation. Therefore we are left with looking at definitive therapies for idiopathic pulmonary fibrosis.   She is not a candidate for Ofev due to anticoagulation. She could be a cajndiate for Pirfenidone. However I warned her that it is not been studied in people over 55, data is present only for one year, and not studied in advanced IPF, number needed to treat is approximately 1:6 in early/moderate IPF, and one third of patients do have GI side effects. Additionally, in my clinical experience people in her age group are at high risk for side effects. In addition her creatinine clearance is around 30. At this creatinine clearance the drug cannot be prescribed in [place] but ok to use in [place] wight significant dose reduction. Therefore, we could try Prifenidone 1 tab tid with meals. INsurance approval needed and told her high risk for denial.  We also disussed palliative option of hospice (see below).  She is not interested in it at this time. She is willing to consider if she declines further  I have dw Dr Kendrick Fries 10/05/2014     > 50% of this > 40 min visit spent in face to face counseling or/and coordination of care   Dr. Kalman Shan, M.D., [facility].C.P Pulmonary and Critical Care Medicine Staff Physician [facility] [place] [facility]monary and Critical Care Pager: [phone], If no answer or between  15:00h - 7:00h: call  [phone]  [phone]  10/05/2014 5:47 PM       Explained Discussed medicare hospice benefit  - a medicare paid benefit  - for people with terminal qualifying  illness such as IPF, COPD, cancer with statistical prognosis < 6 months for which there is no cure - utilization of hospice shows people live longer paradoxically than those without hospice due to improved attention  - explained hospice locations - home, residential etc.,   - explained respite care options for caregivers  -  explained that she could still get treatment for non-hospice diagnosis and still come to office to see me for hospice related diagnosis that i provide support for  - explained that hospice provides nursing, MD, chaplain, volunteer and medications and supplies paid through medicare

## 2014-10-05 NOTE — Patient Instructions (Addendum)
ICD-9-CM ICD-10-CM   1. ILD (interstitial lung disease) 515 J84.9   2. Chronic hypoxemic respiratory failure 518.83 J96.11    799.02    3. Steroid toxicity, sequela 909.5 T38.0X5S    E932.0     I do not think prednisone working for you - giving lot of side effects Likely diagnosis given rapid worsening course is IPF For IPF  - you do not qualify for Ofev because of warfarin use   Plan - will discuss with Dr [name] about starting low dose esbriet - safest of optiosn between prednisone, esbriet and cellcept - cut prednisone down to 20mg  daily - we discussed hospice concept - something to think about for future

## 2014-10-05 NOTE — Telephone Encounter (Signed)
[name] [name]  D/w Dr [name]. I saw patien on 2nd opinon. My note has al details.   Plan = patient to reduce prednisone to 20mg  daily and stay there and Dr [name] will ultimately try to wean off to extent possible. My rec is for her to come off it 100% in next few months   - start esbriet for IPF at 1 tab tid. Let patient know this. No escalation beyond 1 tab tid due to low GFR. Please let patient and insureance know this. Please explain to patient there is potential for insurance denial  Thanks  Dr. Kalman ShanMurali Pammie Chirino, M.D., [facility].C.P Pulmonary and Critical Care Medicine Staff Physician [facility] [place] [facility] and Critical Care Pager: [phone], If no answer or between  15:00h - 7:00h: call [phone]  10/05/2014 5:54 PM

## 2014-10-06 ENCOUNTER — Telehealth: Payer: Self-pay | Admitting: *Deleted

## 2014-10-06 NOTE — Telephone Encounter (Signed)
Pt home health nursing calling to report weight gain  3 pounds since yesterday Nothing out of the ordinary Is taking all medications like normal Only change was PredniSONE 30 to 20  Please advise.

## 2014-10-06 NOTE — Telephone Encounter (Signed)
Not concerned about weight, she is dehydrated by lab work Weight gain from prednisone Previous instructions apply

## 2014-10-06 NOTE — Telephone Encounter (Signed)
Dr. [name], forwarding to you at [facility] request.  Please let me know what I need to do for this patient.  Thanks!

## 2014-10-06 NOTE — Telephone Encounter (Signed)
Spoke w/ [name] [name].  Advised her of Dr. Windell HummingbirdGollan's recommendation.  She verbalizes understanding and will call back w/ any questions or concerns.

## 2014-10-07 NOTE — Telephone Encounter (Signed)
Spoke with pt, she is aware of recs.   I've placed esbriet forms in the mail to pt's verified address with instructions to mail back asap when completed.  Will hold in my inbox to look out for forms.

## 2014-10-07 NOTE — Telephone Encounter (Signed)
Yes, OK to start process for Esbriet, tell her we'll do this please

## 2014-10-11 ENCOUNTER — Emergency Department (HOSPITAL_COMMUNITY): Payer: Medicare Other

## 2014-10-11 ENCOUNTER — Telehealth: Payer: Self-pay

## 2014-10-11 ENCOUNTER — Encounter (HOSPITAL_COMMUNITY): Payer: Self-pay | Admitting: Vascular Surgery

## 2014-10-11 ENCOUNTER — Telehealth: Payer: Self-pay | Admitting: *Deleted

## 2014-10-11 ENCOUNTER — Inpatient Hospital Stay (HOSPITAL_COMMUNITY)
Admission: EM | Admit: 2014-10-11 | Discharge: 2014-10-20 | DRG: 291 | Disposition: A | Discharge status: Home Health | Payer: Medicare Other | Attending: Internal Medicine | Admitting: Internal Medicine

## 2014-10-11 DIAGNOSIS — I480 Paroxysmal atrial fibrillation: Secondary | ICD-10-CM | POA: Diagnosis present

## 2014-10-11 DIAGNOSIS — Z7982 Long term (current) use of aspirin: Secondary | ICD-10-CM

## 2014-10-11 DIAGNOSIS — I4891 Unspecified atrial fibrillation: Secondary | ICD-10-CM

## 2014-10-11 DIAGNOSIS — I5030 Unspecified diastolic (congestive) heart failure: Secondary | ICD-10-CM | POA: Diagnosis not present

## 2014-10-11 DIAGNOSIS — Z7952 Long term (current) use of systemic steroids: Secondary | ICD-10-CM

## 2014-10-11 DIAGNOSIS — Z9981 Dependence on supplemental oxygen: Secondary | ICD-10-CM | POA: Diagnosis not present

## 2014-10-11 DIAGNOSIS — I10 Essential (primary) hypertension: Secondary | ICD-10-CM | POA: Diagnosis present

## 2014-10-11 DIAGNOSIS — I5033 Acute on chronic diastolic (congestive) heart failure: Secondary | ICD-10-CM | POA: Diagnosis present

## 2014-10-11 DIAGNOSIS — I5043 Acute on chronic combined systolic (congestive) and diastolic (congestive) heart failure: Principal | ICD-10-CM | POA: Diagnosis present

## 2014-10-11 DIAGNOSIS — R0902 Hypoxemia: Secondary | ICD-10-CM | POA: Diagnosis present

## 2014-10-11 DIAGNOSIS — F329 Major depressive disorder, single episode, unspecified: Secondary | ICD-10-CM | POA: Diagnosis present

## 2014-10-11 DIAGNOSIS — E662 Morbid (severe) obesity with alveolar hypoventilation: Secondary | ICD-10-CM | POA: Diagnosis present

## 2014-10-11 DIAGNOSIS — Z79899 Other long term (current) drug therapy: Secondary | ICD-10-CM | POA: Diagnosis not present

## 2014-10-11 DIAGNOSIS — Z86711 Personal history of pulmonary embolism: Secondary | ICD-10-CM | POA: Diagnosis present

## 2014-10-11 DIAGNOSIS — I129 Hypertensive chronic kidney disease with stage 1 through stage 4 chronic kidney disease, or unspecified chronic kidney disease: Secondary | ICD-10-CM | POA: Diagnosis present

## 2014-10-11 DIAGNOSIS — J811 Chronic pulmonary edema: Secondary | ICD-10-CM | POA: Insufficient documentation

## 2014-10-11 DIAGNOSIS — J9611 Chronic respiratory failure with hypoxia: Secondary | ICD-10-CM | POA: Diagnosis present

## 2014-10-11 DIAGNOSIS — Z6841 Body Mass Index (BMI) 40.0 and over, adult: Secondary | ICD-10-CM | POA: Diagnosis not present

## 2014-10-11 DIAGNOSIS — E876 Hypokalemia: Secondary | ICD-10-CM | POA: Diagnosis present

## 2014-10-11 DIAGNOSIS — I482 Chronic atrial fibrillation: Secondary | ICD-10-CM | POA: Diagnosis present

## 2014-10-11 DIAGNOSIS — E1165 Type 2 diabetes mellitus with hyperglycemia: Secondary | ICD-10-CM | POA: Diagnosis present

## 2014-10-11 DIAGNOSIS — J9690 Respiratory failure, unspecified, unspecified whether with hypoxia or hypercapnia: Secondary | ICD-10-CM

## 2014-10-11 DIAGNOSIS — J849 Interstitial pulmonary disease, unspecified: Secondary | ICD-10-CM | POA: Diagnosis present

## 2014-10-11 DIAGNOSIS — I5032 Chronic diastolic (congestive) heart failure: Secondary | ICD-10-CM

## 2014-10-11 DIAGNOSIS — K5900 Constipation, unspecified: Secondary | ICD-10-CM | POA: Diagnosis not present

## 2014-10-11 DIAGNOSIS — E785 Hyperlipidemia, unspecified: Secondary | ICD-10-CM | POA: Diagnosis present

## 2014-10-11 DIAGNOSIS — M109 Gout, unspecified: Secondary | ICD-10-CM | POA: Diagnosis present

## 2014-10-11 DIAGNOSIS — J9621 Acute and chronic respiratory failure with hypoxia: Secondary | ICD-10-CM | POA: Diagnosis present

## 2014-10-11 DIAGNOSIS — G4733 Obstructive sleep apnea (adult) (pediatric): Secondary | ICD-10-CM | POA: Diagnosis not present

## 2014-10-11 DIAGNOSIS — Z86718 Personal history of other venous thrombosis and embolism: Secondary | ICD-10-CM | POA: Diagnosis not present

## 2014-10-11 DIAGNOSIS — I714 Abdominal aortic aneurysm, without rupture: Secondary | ICD-10-CM | POA: Diagnosis present

## 2014-10-11 DIAGNOSIS — J8410 Pulmonary fibrosis, unspecified: Secondary | ICD-10-CM | POA: Diagnosis not present

## 2014-10-11 DIAGNOSIS — Z7901 Long term (current) use of anticoagulants: Secondary | ICD-10-CM | POA: Diagnosis not present

## 2014-10-11 DIAGNOSIS — I2510 Atherosclerotic heart disease of native coronary artery without angina pectoris: Secondary | ICD-10-CM | POA: Diagnosis present

## 2014-10-11 DIAGNOSIS — Z7951 Long term (current) use of inhaled steroids: Secondary | ICD-10-CM | POA: Diagnosis not present

## 2014-10-11 DIAGNOSIS — T380X5A Adverse effect of glucocorticoids and synthetic analogues, initial encounter: Secondary | ICD-10-CM | POA: Diagnosis present

## 2014-10-11 DIAGNOSIS — K219 Gastro-esophageal reflux disease without esophagitis: Secondary | ICD-10-CM | POA: Diagnosis present

## 2014-10-11 DIAGNOSIS — J810 Acute pulmonary edema: Secondary | ICD-10-CM | POA: Diagnosis not present

## 2014-10-11 DIAGNOSIS — N183 Chronic kidney disease, stage 3 unspecified: Secondary | ICD-10-CM | POA: Diagnosis present

## 2014-10-11 DIAGNOSIS — J9601 Acute respiratory failure with hypoxia: Secondary | ICD-10-CM | POA: Diagnosis not present

## 2014-10-11 DIAGNOSIS — J449 Chronic obstructive pulmonary disease, unspecified: Secondary | ICD-10-CM | POA: Diagnosis present

## 2014-10-11 DIAGNOSIS — R627 Adult failure to thrive: Secondary | ICD-10-CM | POA: Diagnosis present

## 2014-10-11 DIAGNOSIS — J189 Pneumonia, unspecified organism: Secondary | ICD-10-CM | POA: Diagnosis not present

## 2014-10-11 HISTORY — DX: Gout, unspecified: M10.9

## 2014-10-11 HISTORY — DX: Major depressive disorder, single episode, unspecified: F32.9

## 2014-10-11 HISTORY — DX: Dependence on supplemental oxygen: Z99.81

## 2014-10-11 HISTORY — DX: Chronic kidney disease, stage 2 (mild): N18.2

## 2014-10-11 HISTORY — DX: Iron deficiency anemia, unspecified: D50.9

## 2014-10-11 HISTORY — DX: Chronic obstructive pulmonary disease, unspecified: J44.9

## 2014-10-11 HISTORY — DX: Obstructive sleep apnea (adult) (pediatric): G47.33

## 2014-10-11 HISTORY — DX: Personal history of other medical treatment: Z92.89

## 2014-10-11 HISTORY — DX: Depression, unspecified: F32.A

## 2014-10-11 LAB — GLUCOSE, CAPILLARY
Glucose-Capillary: 139 mg/dL — ABNORMAL HIGH (ref 65–99)
Glucose-Capillary: 174 mg/dL — ABNORMAL HIGH (ref 65–99)

## 2014-10-11 LAB — CBC
HEMATOCRIT: 45.5 % (ref 36.0–46.0)
Hemoglobin: 14.4 g/dL (ref 12.0–15.0)
MCH: 31.9 pg (ref 26.0–34.0)
MCHC: 31.6 g/dL (ref 30.0–36.0)
MCV: 100.9 fL — ABNORMAL HIGH (ref 78.0–100.0)
PLATELETS: 213 10*3/uL (ref 150–400)
RBC: 4.51 MIL/uL (ref 3.87–5.11)
RDW: 20.9 % — ABNORMAL HIGH (ref 11.5–15.5)
WBC: 14.1 10*3/uL — AB (ref 4.0–10.5)

## 2014-10-11 LAB — BASIC METABOLIC PANEL
Anion gap: 13 (ref 5–15)
BUN: 35 mg/dL — ABNORMAL HIGH (ref 6–20)
CO2: 34 mmol/L — ABNORMAL HIGH (ref 22–32)
CREATININE: 1.55 mg/dL — AB (ref 0.44–1.00)
Calcium: 9.1 mg/dL (ref 8.9–10.3)
Chloride: 94 mmol/L — ABNORMAL LOW (ref 101–111)
GFR calc Af Amer: 35 mL/min — ABNORMAL LOW (ref >60)
GFR calc non Af Amer: 30 mL/min — ABNORMAL LOW (ref >60)
Glucose, Bld: 133 mg/dL — ABNORMAL HIGH (ref 65–99)
Potassium: 4.3 mmol/L (ref 3.5–5.1)
SODIUM: 141 mmol/L (ref 135–145)

## 2014-10-11 LAB — PROTIME-INR
INR: 2.3 — ABNORMAL HIGH (ref 0.00–1.49)
PROTHROMBIN TIME: 25 s — AB (ref 11.6–15.2)

## 2014-10-11 LAB — BRAIN NATRIURETIC PEPTIDE: B Natriuretic Peptide: 327.1 pg/mL — ABNORMAL HIGH (ref 0.0–100.0)

## 2014-10-11 LAB — TROPONIN I: Troponin I: 0.04 ng/mL — ABNORMAL HIGH (ref <0.031)

## 2014-10-11 LAB — I-STAT TROPONIN, ED: Troponin i, poc: 0.06 ng/mL (ref 0.00–0.08)

## 2014-10-11 MED ORDER — DILTIAZEM HCL ER COATED BEADS 240 MG PO CP24
240.0000 mg | ORAL_CAPSULE | Freq: Every day | ORAL | Status: DC
  Administered 2014-10-12 – 2014-10-20 (×9): 240 mg via ORAL
  Filled 2014-10-11 (×10): qty 1

## 2014-10-11 MED ORDER — FUROSEMIDE 10 MG/ML IJ SOLN
60.0000 mg | Freq: Two times a day (BID) | INTRAMUSCULAR | Status: DC
  Administered 2014-10-11: 60 mg via INTRAVENOUS
  Filled 2014-10-11: qty 6

## 2014-10-11 MED ORDER — GABAPENTIN 300 MG PO CAPS
300.0000 mg | ORAL_CAPSULE | Freq: Every day | ORAL | Status: DC
  Administered 2014-10-11 – 2014-10-19 (×9): 300 mg via ORAL
  Filled 2014-10-11 (×9): qty 1

## 2014-10-11 MED ORDER — ALBUTEROL SULFATE (2.5 MG/3ML) 0.083% IN NEBU
5.0000 mg | INHALATION_SOLUTION | Freq: Once | RESPIRATORY_TRACT | Status: AC
  Administered 2014-10-11: 5 mg via RESPIRATORY_TRACT
  Filled 2014-10-11: qty 6

## 2014-10-11 MED ORDER — CETYLPYRIDINIUM CHLORIDE 0.05 % MT LIQD
7.0000 mL | Freq: Two times a day (BID) | OROMUCOSAL | Status: DC
  Administered 2014-10-12 – 2014-10-20 (×14): 7 mL via OROMUCOSAL

## 2014-10-11 MED ORDER — BUDESONIDE 0.25 MG/2ML IN SUSP
0.2500 mg | Freq: Two times a day (BID) | RESPIRATORY_TRACT | Status: DC
  Administered 2014-10-11 – 2014-10-12 (×2): 0.25 mg via RESPIRATORY_TRACT
  Filled 2014-10-11 (×2): qty 2

## 2014-10-11 MED ORDER — GABAPENTIN 300 MG PO CAPS: 300.0000 mg | ORAL_CAPSULE | Freq: Every day | ORAL | Status: DC

## 2014-10-11 MED ORDER — LORATADINE 10 MG PO TABS
10.0000 mg | ORAL_TABLET | Freq: Every day | ORAL | Status: DC
Start: 2014-10-11 — End: 2014-10-20
  Administered 2014-10-11 – 2014-10-20 (×10): 10 mg via ORAL
  Filled 2014-10-11 (×10): qty 1

## 2014-10-11 MED ORDER — ALLOPURINOL 300 MG PO TABS
300.0000 mg | ORAL_TABLET | Freq: Every day | ORAL | Status: DC
  Administered 2014-10-12 – 2014-10-20 (×9): 300 mg via ORAL
  Filled 2014-10-11 (×9): qty 1

## 2014-10-11 MED ORDER — COLCHICINE 0.6 MG PO TABS: 0.6000 mg | ORAL_TABLET | ORAL | Status: DC | PRN

## 2014-10-11 MED ORDER — FAMOTIDINE 20 MG PO TABS
20.0000 mg | ORAL_TABLET | Freq: Every day | ORAL | Status: DC
  Administered 2014-10-12 – 2014-10-20 (×9): 20 mg via ORAL
  Filled 2014-10-11 (×9): qty 1

## 2014-10-11 MED ORDER — FUROSEMIDE 10 MG/ML IJ SOLN
60.0000 mg | Freq: Once | INTRAMUSCULAR | Status: AC
  Administered 2014-10-11: 60 mg via INTRAVENOUS
  Filled 2014-10-11: qty 6

## 2014-10-11 MED ORDER — WARFARIN SODIUM 7.5 MG PO TABS
3.7500 mg | ORAL_TABLET | Freq: Once | ORAL | Status: AC
  Administered 2014-10-11: 3.75 mg via ORAL
  Filled 2014-10-11: qty 2

## 2014-10-11 MED ORDER — ASPIRIN 81 MG PO TABS
81.0000 mg | ORAL_TABLET | Freq: Every day | ORAL | Status: DC
  Administered 2014-10-12 – 2014-10-14 (×3): 81 mg via ORAL
  Filled 2014-10-11 (×8): qty 1

## 2014-10-11 MED ORDER — ALBUTEROL SULFATE (2.5 MG/3ML) 0.083% IN NEBU
2.5000 mg | INHALATION_SOLUTION | RESPIRATORY_TRACT | Status: DC | PRN
  Administered 2014-10-12: 2.5 mg via RESPIRATORY_TRACT
  Filled 2014-10-11: qty 3

## 2014-10-11 MED ORDER — POTASSIUM CHLORIDE CRYS ER 20 MEQ PO TBCR
20.0000 meq | EXTENDED_RELEASE_TABLET | Freq: Every day | ORAL | Status: DC
  Administered 2014-10-12 – 2014-10-17 (×6): 20 meq via ORAL
  Filled 2014-10-11 (×6): qty 1

## 2014-10-11 MED ORDER — WARFARIN - PHARMACIST DOSING INPATIENT
Freq: Every day | Status: DC
  Administered 2014-10-11 – 2014-10-16 (×3)

## 2014-10-11 MED ORDER — ESCITALOPRAM OXALATE 10 MG PO TABS
20.0000 mg | ORAL_TABLET | Freq: Every day | ORAL | Status: DC
  Administered 2014-10-12 – 2014-10-20 (×9): 20 mg via ORAL
  Filled 2014-10-11 (×9): qty 2

## 2014-10-11 MED ORDER — PREDNISONE 20 MG PO TABS: 20.0000 mg | ORAL_TABLET | Freq: Every day | ORAL | Status: DC

## 2014-10-11 MED ORDER — INSULIN ASPART 100 UNIT/ML ~~LOC~~ SOLN: 0.0000 [IU] | Freq: Every day | SUBCUTANEOUS | Status: DC

## 2014-10-11 MED ORDER — FERROUS SULFATE 325 (65 FE) MG PO TABS
325.0000 mg | ORAL_TABLET | Freq: Two times a day (BID) | ORAL | Status: DC
  Administered 2014-10-12 – 2014-10-20 (×18): 325 mg via ORAL
  Filled 2014-10-11 (×18): qty 1

## 2014-10-11 MED ORDER — INSULIN ASPART 100 UNIT/ML ~~LOC~~ SOLN
0.0000 [IU] | Freq: Three times a day (TID) | SUBCUTANEOUS | Status: DC
  Administered 2014-10-13: 1 [IU] via SUBCUTANEOUS

## 2014-10-11 MED ORDER — ATORVASTATIN CALCIUM 10 MG PO TABS
10.0000 mg | ORAL_TABLET | Freq: Every day | ORAL | Status: DC
  Administered 2014-10-12 – 2014-10-20 (×9): 10 mg via ORAL
  Filled 2014-10-11 (×9): qty 1

## 2014-10-11 MED ORDER — FUROSEMIDE 10 MG/ML IJ SOLN
80.0000 mg | Freq: Two times a day (BID) | INTRAMUSCULAR | Status: DC
  Administered 2014-10-11 – 2014-10-12 (×2): 80 mg via INTRAVENOUS
  Filled 2014-10-11 (×3): qty 8

## 2014-10-11 NOTE — Progress Notes (Signed)
Patient ID: [name] [name] MRN: [id number], DOB/AGE: 06/19/1931   Admit date: 10/11/2014   Primary Physician: Kerby Nora, MD Primary Cardiologist: Dr. Mariah Milling  Pt. Profile:  81 y/o female with chronic diastolic HF and interstitial lung disease presenting with increased dyspnea and weight gain.   Problem List  Past Medical History  Diagnosis Date  . Congestive heart failure, unspecified     a. ECHO 6/0: EF 55-60%, mild LVH, mildly dilated RV w. mildly dec fx, RVSP 67  . Chronic atrial fibrillation     failed cardioversion in past  -repeat DC-CV on October 5th, 2010  . Personal history of DVT (deep vein thrombosis) 2008  . Obstructive sleep apnea   . HTN (hypertension)   . Morbid obesity   . Shingles     with postherpetic neuraigia  . GERD (gastroesophageal reflux disease)   . Hyperlipidemia   . Meningitis 1950s  . Gallstones     s/p cholecystectomy  . CAD (coronary artery disease)     nonobstructive by cath 8/10 - LAD 40-50% w mild PAH mean 29 w PVR 3.2 Woods  . AAA (abdominal aortic aneurysm)     small  . PE (pulmonary embolism)     history of  . Anemia   . Shortness of breath dyspnea   . Chronic kidney disease     ckd stage 2  . Lung interstitial disease   . Hypoxemia     Past Surgical History  Procedure Laterality Date  . Appendectomy  1947  . Tonsillectomy  1946  . Hospitalized with meningitis at [place] [facility]  1960  . Cholecystectomy    . Cardioversion  01/2007    x2  . Right heart catheterization N/A 02/28/2014    Procedure: RIGHT HEART CATH;  Surgeon: Laurey Morale, MD;  Location: [facility] CATH LAB;  Service: Cardiovascular;  Laterality: N/A;     Allergies  No Known Allergies  HPI  The patient is a 81 y/o morbidly obese female, followed by Dr. Mariah Milling. Her PMH is significant for chronic systolic HF, chronic respiratory distress secondary to interstitial lung disease/pulmonary fibrosis and obesity hypoventilation syndrome. This is followed by Dr. Kendrick Fries.   She is on chronic home O2 (5%L at baseline) and chronic prednisone. She also has a h/o atrial fibrillation, previously on flecainide, h/o DVT and PE in 2007 (now on chronic warfarin), h/o nonobstructive CAD by cath 12/2008, small AAA and OSA. Her most recent 2D echo was 07/2014 which demonstrated normal LV systolic function with an EF of 55-60% with G1DD. Also with mild MR and moderate-severely dilated LA.    She presents to the [facility] ED today with complaints of progressive worsening dyspnea and fluid retension/ weight gain. Her baseline weight is 260-265lb. Yesterday when she weighed herself at home, she was 300 lb. She denies dietary indiscretion with sodium. She reports that she attempted taking metolazone at home, in addition to her torsemide,  with no increased diuresis. She was seen by Dr. Mariah Milling on 10/05/14. He felt her prednisone was contributing to her weight gain. A BMP was obtained which showed an increase in SCr and BUN to 1.89 and 61 mg/dL respectively.  Her baseline SCr is ~1.3. There was concerns for dehydration. She was instructed to discontinue use of metolazone. She was also advised to hold her torsemide for 2 days, then resume  a lower dose. Her diuretic was decreased from 120 mg down to 60 mg.    Today, she notes her increased dyspnea  progressed to labored breathing which required an increase in her home O2 from 5 to 7L. This prompted her to present to the ED via EMS.  She was hypoxic when EMS arrived with O2 sats in the 70s. She was placed on a NRB at 15L and her O2 sats improved into the mid 90s. She denies CP. No recent palpations.  BNP is mildly elevated at 327.1 (658.4, 2 months ago). CXR shows enlargement of cardiac silhouette with vascular congestion and mild pulmonary edema.  POC troponin is normal. SCr remains elevated but improved since recent office visit (1.89-->1.55). BUN improved from 61 to 35.  INR is therapeutic at 2.30. EKG nonacute.    Home Medications  Prior to Admission  medications   Medication Sig Start Date End Date Taking? Authorizing Provider  allopurinol (ZYLOPRIM) 300 MG tablet Take 1 tablet (300 mg total) by mouth daily. 06/08/14   Amy Michelle NasutiE Bedsole, MD  aspirin 81 MG tablet Take 81 mg by mouth daily.    Historical Provider, MD  atorvastatin (LIPITOR) 10 MG tablet Take 1 tablet (10 mg total) by mouth daily. 07/18/14   Amy Michelle NasutiE Bedsole, MD  budesonide (PULMICORT) 0.25 MG/2ML nebulizer solution Take 0.25 mg by nebulization 2 (two) times daily.    Historical Provider, MD  cetirizine (ZYRTEC ALLERGY) 10 MG tablet Take 10 mg by mouth as needed for allergies.     Historical Provider, MD  colchicine 0.6 MG tablet Take 0.6 mg by mouth as needed (for flareup).  01/11/14   Historical Provider, MD  diltiazem (CARDIZEM CD) 240 MG 24 hr capsule Take 1 capsule (240 mg total) by mouth daily. 08/07/14   Renae FickleMackenzie Short, MD  escitalopram (LEXAPRO) 20 MG tablet Take 1 tablet (20 mg total) by mouth daily. 05/02/14   Amy Michelle NasutiE Bedsole, MD  famotidine (PEPCID) 20 MG tablet Take 1 tablet (20 mg total) by mouth daily. 04/15/14   Antonieta Ibaimothy J Gollan, MD  ferrous sulfate 325 (65 FE) MG tablet Take 1 tablet (325 mg total) by mouth 2 (two) times daily with a meal. 08/07/14   Renae FickleMackenzie Short, MD  gabapentin (NEURONTIN) 300 MG capsule Take 1 capsule (300 mg total) by mouth daily. 08/18/14   Amy Michelle NasutiE Bedsole, MD  metolazone (ZAROXOLYN) 5 MG tablet Take 1 tablet (5 mg total) by mouth daily as needed. 08/19/14   Antonieta Ibaimothy J Gollan, MD  OVER THE COUNTER MEDICATION Inhale 1 application into the lungs at bedtime. USES BIPAP EVERY BEDTIME FOR SLEEP    Historical Provider, MD  potassium chloride SA (K-DUR,KLOR-CON) 20 MEQ tablet Take 20 mEq by mouth daily.    Historical Provider, MD  predniSONE (DELTASONE) 5 MG tablet Take 30 mg by mouth daily. 09/09/14   Historical Provider, MD  torsemide (DEMADEX) 20 MG tablet Take 3 tablets (60 mg total) by mouth 2 (two) times daily. 08/19/14   Antonieta Ibaimothy J Gollan, MD  warfarin (COUMADIN) 2.5  MG tablet Take as directed. 10/04/14   Amy Michelle NasutiE Bedsole, MD    Family History  Family History  Problem Relation Age of Onset  . Heart failure Mother   . Depression Mother   . Ovarian cancer Paternal Grandmother   . Heart disease Paternal Grandfather   . Obesity Other     Social History  History   Social History  . Marital Status: Widowed    Spouse Name: N/A  . Number of Children: N/A  . Years of Education: N/A   Occupational History  . Not on file.  Social History Main Topics  . Smoking status: Never Smoker   . Smokeless tobacco: Never Used  . Alcohol Use: Yes     Comment: occasional  . Drug Use: No  . Sexual Activity: Not on file   Other Topics Concern  . Not on file   Social History Narrative   Widowed. Retired from Teacher, English as a foreign language.     Review of Systems General:  No chills, fever, night sweats or weight changes.  Cardiovascular:  No chest pain, dyspnea on exertion, edema, orthopnea, palpitations, paroxysmal nocturnal dyspnea. Dermatological: No rash, lesions/masses Respiratory: No cough, dyspnea Urologic: No hematuria, dysuria Abdominal:   No nausea, vomiting, diarrhea, bright red blood per rectum, melena, or hematemesis Neurologic:  No visual changes, wkns, changes in mental status. All other systems reviewed and are otherwise negative except as noted above.  Physical Exam  Blood pressure 112/67, pulse 80, temperature 99.3 F (37.4 C), temperature source Axillary, resp. rate 24, SpO2 96 %.  General: Pleasant, morbidly obese, mild labored breathing Psych: Normal affect. Neuro: Alert and oriented X 3. Moves all extremities spontaneously. HEENT: Normal  Neck: Supple without bruits. JVD difficult to assess due to body habitus Lungs:  Mildly labored, now on NRB, decreased BS bilaterally with faint bibasilar rales. Heart: RRR no s3, s4, or murmurs. Abdomen:  non-tender, distended, BS + x 4.  Extremities: No clubbing, or cyanosis bilateral LEE. DP/PT/Radials 2+  and equal bilaterally.  Labs  Troponin [facility] Test)  Recent Labs  10/11/14 1436  TROPIPOC 0.06   No results for input(s): CKTOTAL, CKMB, TROPONINI in the last 72 hours. Lab Results  Component Value Date   WBC 14.1* 10/11/2014   HGB 14.4 10/11/2014   HCT 45.5 10/11/2014   MCV 100.9* 10/11/2014   PLT 213 10/11/2014     Recent Labs Lab 10/11/14 1431  NA 141  K 4.3  CL 94*  CO2 34*  BUN 35*  CREATININE 1.55*  CALCIUM 9.1  GLUCOSE 133*   Lab Results  Component Value Date   CHOL 115 03/03/2014   HDL 49 03/03/2014   LDLCALC 44 03/03/2014   TRIG 110 03/03/2014   Lab Results  Component Value Date   DDIMER  02/10/2009    0.33        AT THE INHOUSE ESTABLISHED CUTOFF VALUE OF 0.48 ug/mL FEU, THIS ASSAY HAS BEEN DOCUMENTED IN THE LITERATURE TO HAVE A SENSITIVITY AND NEGATIVE PREDICTIVE VALUE OF AT LEAST 98 TO 99%.  THE TEST RESULT SHOULD BE CORRELATED WITH AN ASSESSMENT OF THE CLINICAL PROBABILITY OF DVT / VTE.     Radiology/Studies  Dg Chest Port 1 View  10/11/2014   CLINICAL DATA:  Shortness of breath for 4 days, history of unspecified CHF, chronic atrial fibrillation, DVT, hypertension, hyperlipidemia, pulmonary embolism, coronary artery disease, abdominal aortic aneurysm, interstitial lung disease  EXAM: PORTABLE CHEST - 1 VIEW  COMPARISON:  Portable exam 1440 hours compared to 08/01/2014  FINDINGS: Enlargement of cardiac silhouette with pulmonary vascular congestion.  Chronic interstitial changes diffusely which could represent CHF though underlying chronic interstitial disease not excluded.  Pleural thickening at the apices unchanged.  No segmental consolidation, pleural effusion or pneumothorax.  Bones demineralized.  IMPRESSION: Enlargement of cardiac silhouette with vascular congestion and question mild pulmonary edema though cannot exclude underlying chronic interstitial lung disease.   Electronically Signed   By: Ulyses Southward M.D.   On: 10/11/2014  14:55    ECG  NSR 79 bpm. Nonacute.  ASSESSMENT AND PLAN  Active Problems:   Essential hypertension   Atrial fibrillation   CKD (chronic kidney disease) stage 3, GFR 30-59 ml/min   Acute on chronic diastolic heart failure   Acute on chronic respiratory failure with hypoxemia   ILD (interstitial lung disease)   1. Acute on Chronic Diastolic CHF: denies dietary indiscretion with sodium. Chronic prednisone therapy possible contributor. She is well above her dry weight of 260-265lb. Last home weight 1 day ago was 300 lb. BNP elevated at 327. CXR c/w CHF with mild pulmonary edema. Physical exam notable for decreased BS bilaterally with faint bibasilar rales, bilateral LEE and abdominal distension. Will admit to telemetry for IV diuretic therapy with lasix. 80 mg BID. Check daily weights. Strict I/Os. Will place foley for acute UOP measurements. Low sodium diet. Follow BMP daily to monitor renal function and electrolytes. Keep BP controlled.   2. PAF: in NSR on telemetry. Rate is controlled. Continue Coumadin. INR is therapeutic.   3. HTN: BP well controlled on current regimen  4. Acute on Chronic Kidney Disease: SCr elevated at 1.55. Improved from 1.89 1 week ago. Baseline ~1.3. Will monitor closely as we attempt to diurese with IV diuretics.   5. Acute on Chronic Respiratory Failure: increased O2 requirements. Multifactorial with h/o ILD/ pulmonary fibrosis, obesity hypoventilation syndrome and acute on chronic diastolic CHF. May need pulmonary consult/assistance this admission.    Signed, Robbie Lis, PA-C 10/11/2014, 4:20 PM  Patient seen, examined. Available data reviewed. Agree with findings, assessment, and plan as outlined by Robbie Lis, PA-C. The patient is independently interviewed and examined. She reports chronic shortness of breath, worse over the last few weeks. She has developed progressive symptoms at rest over the last 48 hours. Her diarrhetic's were cut  back recently because of for renal function. She has gained a lot of weight on prednisone over the last few months. She denies chest pain or pressure. She also complains of edema and orthopnea.  On exam, it is difficult to assess volume status because of morbid obesity. I cannot visualize her jugular veins. Lungs have diffuse rales. Heart sounds are distant. Abdomen is obese and nontender. Extremities show diffuse leg edema. The patient was given IV Lasix about 2 hours ago and has not responded yet.  I have reviewed all available data, including lab work and radiographic studies. She has acute on chronic respiratory failure, now on an oxygen nonrebreather. I think acute diastolic heart failure is clearly contributing to her symptoms. However, underlying pulmonary fibrosis and morbid obesity are clearly playing a role. We will treat with furosemide 80 mg IV twice daily. Renal function will need to be watched closely. Have requested that the patient be admitted by the internal medicine service and it may be reasonable to consult pulmonary. We will follow and help manage her heart failure.  Tonny Bollman, M.D. 10/11/2014 6:02 PM

## 2014-10-11 NOTE — Telephone Encounter (Signed)
Pt caregiver, Toni AmendCourtney calling stating pt it still SOB  increased her ox to 7 liters and her o2 is about 60%  Family will take her ED but caregiver states we need to tell her family But pt does not want to go.

## 2014-10-11 NOTE — ED Notes (Signed)
Phlebotomy at bedside.

## 2014-10-11 NOTE — Progress Notes (Signed)
ANTICOAGULATION CONSULT NOTE - Initial Consult  Pharmacy Consult for Coumadin Indication: atrial fibrillation  No Known Allergies  Patient Measurements: Height: 5\' 4"  (162.6 cm) Weight: 271 lb 6.4 oz (123.106 kg) IBW/kg (Calculated) : 54.7  Vital Signs: Temp: 97.5 F (36.4 C) (05/31 2051) Temp Source: Oral (05/31 2051) BP: 136/56 mmHg (05/31 2051) Pulse Rate: 85 (05/31 2051)  Labs:  Recent Labs  10/11/14 1431 10/11/14 2036  HGB 14.4  --   HCT 45.5  --   PLT 213  --   LABPROT 25.0*  --   INR 2.30*  --   CREATININE 1.55*  --   TROPONINI  --  0.04*    Estimated Creatinine Clearance: 37.5 mL/min (by C-G formula based on Cr of 1.55).   Medical History: Past Medical History  Diagnosis Date  . Congestive heart failure, unspecified     a. ECHO 6/0: EF 55-60%, mild LVH, mildly dilated RV w. mildly dec fx, RVSP 67  . Chronic atrial fibrillation     failed cardioversion in past  -repeat DC-CV on October 5th, 2010  . Personal history of DVT (deep vein thrombosis) 2008  . Obstructive sleep apnea   . HTN (hypertension)   . Morbid obesity   . Shingles     with postherpetic neuraigia  . GERD (gastroesophageal reflux disease)   . Hyperlipidemia   . Meningitis 1950s or 1960's    [facility]  . Gallstones     s/p cholecystectomy  . CAD (coronary artery disease)     nonobstructive by cath 8/10 - LAD 40-50% w mild PAH mean 29 w PVR 3.2 Woods  . AAA (abdominal aortic aneurysm)     small  . PE (pulmonary embolism)     history of  . Anemia   . Shortness of breath dyspnea   . Chronic kidney disease     ckd stage 2  . Lung interstitial disease   . Hypoxemia    Assessment:   81 year old woman with ILD and CHF, admitted tonight with shortness of breath and weight gain.  IV diuresis begun.   On Coumadin for atrial fibrillation.  INR is therapeutic at 2.3. Also with hx DVT and PE in 2007, and hospitalization for epistaxis in March 2015.    Home regimen per  daughter:  2.5 mg MWFSat, 3.75 mg TTSun.   Last dose 5/30 pm.  Outpatient INR 2.8 on 09/29/14  Goal of Therapy:  INR 2-3 Monitor platelets by anticoagulation protocol: Yes   Plan:   Coumadin 3.75 mg tonight, usual Tuesday dose.  PT/INR daily for now.  Dennie FettersEgan, Ziyon Cedotal Donovan, RPh Pager: [phone] 10/11/2014,9:58 PM

## 2014-10-11 NOTE — Telephone Encounter (Signed)
Spoke w/ pt.  She reports a 4lb wt gain overnight, audible SOB, and reduced urine output.  She reports that she does not drink much fluid and eats a reduced sodium diet.  She reports that sx have worsened since med change last week:  "Lab work results Renal function is worse than previous numbers She is getting progressively more dehydrated Would not take metolazone Would hold torsemide for 2 days Then would resume torsemide only in the morning Perhaps next week resume her torsemide twice a day"  Pt would like to know what she can do to reduce the fluid on her and help her breathe.  She adamantly refuses to go to the ED and asks what Dr. Mariah MillingGollan recommends she do.

## 2014-10-11 NOTE — Plan of Care (Signed)
Problem: Phase I Progression Outcomes Goal: Voiding-avoid urinary catheter unless indicated Outcome: Completed/Met Date Met:  10/11/14 Foley catheter indicated:  Aggressive IV diuresis, patient is unable to accurately collect.

## 2014-10-11 NOTE — ED Notes (Signed)
Dr. Jacubowitz at bedside 

## 2014-10-11 NOTE — Telephone Encounter (Signed)
Pt c/o Shortness Of Breath: STAT if SOB developed within the last 24 hours or pt is noticeably SOB on the phone  1. Are you currently SOB (can you hear that pt is SOB on the phone)? yes  2. How long have you been experiencing SOB? All weekend  3. Are you SOB when sitting or when up moving around? Cant move around much, is out of breath even sitting down  4. Are you currently experiencing any other symptoms? Vision is blurred

## 2014-10-11 NOTE — Telephone Encounter (Signed)
With such a degree of hypoxia, in the setting of being on her diuresis and renal function suggesting dehydration, This is unfortunately most likely a progressive pulmonary issue We could certain increase the torsemide back up to a high level if it provides comfort.

## 2014-10-11 NOTE — ED Provider Notes (Addendum)
CSN: [id number]     Arrival date & time 10/11/14  1406 History   First MD Initiated Contact with Patient 10/11/14 1422     Chief Complaint  Patient presents with  . Shortness of Breath     (Consider location/radiation/quality/duration/timing/severity/associated sxs/prior Treatment) Patient is a 81 y.o. female presenting with shortness of breath.  Shortness of Breath  She reports progressively worsening shortness of breath onset 3 days ago. She had her torsemide dose diminished by her cardiologist on 10/06/2014 due to worsening renal function. She denies chest pain denies cough denies other complaint. She has been treating herself with home nebulizer treatment with partial relief. Her EMS treated patient with increased oxygen. She admits to 4 pound weight gain since 10/08/2014. No other associated symptoms. Nothing makes symptoms better or worse Past Medical History  Diagnosis Date  . Congestive heart failure, unspecified     a. ECHO 6/0: EF 55-60%, mild LVH, mildly dilated RV w. mildly dec fx, RVSP 67  . Chronic atrial fibrillation     failed cardioversion in past  -repeat DC-CV on October 5th, 2010  . Personal history of DVT (deep vein thrombosis) 2008  . Obstructive sleep apnea   . HTN (hypertension)   . Morbid obesity   . Shingles     with postherpetic neuraigia  . GERD (gastroesophageal reflux disease)   . Hyperlipidemia   . Meningitis 1950s  . Gallstones     s/p cholecystectomy  . CAD (coronary artery disease)     nonobstructive by cath 8/10 - LAD 40-50% w mild PAH mean 29 w PVR 3.2 Woods  . AAA (abdominal aortic aneurysm)     small  . PE (pulmonary embolism)     history of  . Anemia   . Shortness of breath dyspnea   . Chronic kidney disease     ckd stage 2  . Lung interstitial disease   . Hypoxemia    Past Surgical History  Procedure Laterality Date  . Appendectomy  1947  . Tonsillectomy  1946  . Hospitalized with meningitis at [facility]  1960  .  Cholecystectomy    . Cardioversion  01/2007    x2  . Right heart catheterization N/A 02/28/2014    Procedure: RIGHT HEART CATH;  Surgeon: Laurey Morale, MD;  Location: [facility] CATH LAB;  Service: Cardiovascular;  Laterality: N/A;   Family History  Problem Relation Age of Onset  . Heart failure Mother   . Depression Mother   . Ovarian cancer Paternal Grandmother   . Heart disease Paternal Grandfather   . Obesity Other    History  Substance Use Topics  . Smoking status: Never Smoker   . Smokeless tobacco: Never Used  . Alcohol Use: Yes     Comment: occasional   OB History    No data available     Review of Systems  Constitutional: Negative.   HENT: Negative.   Respiratory: Positive for shortness of breath.   Cardiovascular: Positive for leg swelling.  Gastrointestinal: Negative.   Musculoskeletal: Negative.   Skin: Negative.   Neurological: Negative.   Psychiatric/Behavioral: Negative.   All other systems reviewed and are negative.     Allergies  Review of patient's allergies indicates no known allergies.  Home Medications   Prior to Admission medications   Medication Sig Start Date End Date Taking? Authorizing Provider  allopurinol (ZYLOPRIM) 300 MG tablet Take 1 tablet (300 mg total) by mouth daily. 06/08/14   Excell Seltzer, MD  aspirin 81 MG tablet Take 81 mg by mouth daily.    Historical Provider, MD  atorvastatin (LIPITOR) 10 MG tablet Take 1 tablet (10 mg total) by mouth daily. 07/18/14   Amy Michelle NasutiE Bedsole, MD  budesonide (PULMICORT) 0.25 MG/2ML nebulizer solution Take 0.25 mg by nebulization 2 (two) times daily.    Historical Provider, MD  cetirizine (ZYRTEC ALLERGY) 10 MG tablet Take 10 mg by mouth as needed for allergies.     Historical Provider, MD  colchicine 0.6 MG tablet Take 0.6 mg by mouth as needed (for flareup).  01/11/14   Historical Provider, MD  diltiazem (CARDIZEM CD) 240 MG 24 hr capsule Take 1 capsule (240 mg total) by mouth daily. 08/07/14   Renae FickleMackenzie  Short, MD  escitalopram (LEXAPRO) 20 MG tablet Take 1 tablet (20 mg total) by mouth daily. 05/02/14   Amy Michelle NasutiE Bedsole, MD  famotidine (PEPCID) 20 MG tablet Take 1 tablet (20 mg total) by mouth daily. 04/15/14   Antonieta Ibaimothy J Gollan, MD  ferrous sulfate 325 (65 FE) MG tablet Take 1 tablet (325 mg total) by mouth 2 (two) times daily with a meal. 08/07/14   Renae FickleMackenzie Short, MD  gabapentin (NEURONTIN) 300 MG capsule Take 1 capsule (300 mg total) by mouth daily. 08/18/14   Amy Michelle NasutiE Bedsole, MD  metolazone (ZAROXOLYN) 5 MG tablet Take 1 tablet (5 mg total) by mouth daily as needed. 08/19/14   Antonieta Ibaimothy J Gollan, MD  OVER THE COUNTER MEDICATION Inhale 1 application into the lungs at bedtime. USES BIPAP EVERY BEDTIME FOR SLEEP    Historical Provider, MD  potassium chloride SA (K-DUR,KLOR-CON) 20 MEQ tablet Take 20 mEq by mouth daily.    Historical Provider, MD  predniSONE (DELTASONE) 5 MG tablet Take 30 mg by mouth daily. 09/09/14   Historical Provider, MD  torsemide (DEMADEX) 20 MG tablet Take 3 tablets (60 mg total) by mouth 2 (two) times daily. 08/19/14   Antonieta Ibaimothy J Gollan, MD  warfarin (COUMADIN) 2.5 MG tablet Take as directed. 10/04/14   Amy E Ermalene SearingBedsole, MD   BP 121/63 mmHg  Pulse 79  Temp(Src) 99.3 F (37.4 C) (Axillary)  Resp 26  SpO2 95% Physical Exam  Constitutional: No distress.  Chronically ill-appearing  HENT:  Head: Normocephalic and atraumatic.  Eyes: Conjunctivae are normal. Pupils are equal, round, and reactive to light.  Neck: Neck supple. No tracheal deviation present. No thyromegaly present.  Cardiovascular: Normal rate and regular rhythm.   No murmur heard. Pulmonary/Chest: Effort normal and breath sounds normal.  Abdominal: Soft. Bowel sounds are normal. She exhibits no distension. There is no tenderness.  Morbidly obese  Musculoskeletal: Normal range of motion. She exhibits edema. She exhibits no tenderness.  3+ pretibial pitting edema bilaterally  Neurological: She is alert. Coordination  normal.  Skin: Skin is warm and dry. No rash noted.  Psychiatric: She has a normal mood and affect.  Nursing note and vitals reviewed.   ED Course  Procedures (including critical care time) Labs Review Labs Reviewed  CBC  BASIC METABOLIC PANEL  BRAIN NATRIURETIC PEPTIDE  I-STAT TROPOININ, ED    Imaging Review No results found.   EKG Interpretation   Date/Time:  Tuesday Oct 11 2014 14:32:09 EDT Ventricular Rate:  79 PR Interval:  157 QRS Duration: 88 QT Interval:  391 QTC Calculation: 448 R Axis:   -5 Text Interpretation:  Pacemaker spikes or artifacts Sinus rhythm Atrial  premature complexes Abnormal T, consider ischemia, lateral leads No  significant change since last  tracing Confirmed by Ethelda Chick  MD, Bronsyn Shappell  [id number]) on 10/11/2014 2:39:43 PM     Chest x-ray viewed by me.  Results for orders placed or performed during the hospital encounter of 10/11/14  CBC  Result Value Ref Range   WBC 14.1 (H) 4.0 - 10.5 K/uL   RBC 4.51 3.87 - 5.11 MIL/uL   Hemoglobin 14.4 12.0 - 15.0 g/dL   HCT 60.4 54.0 - 98.1 %   MCV 100.9 (H) 78.0 - 100.0 fL   MCH 31.9 26.0 - 34.0 pg   MCHC 31.6 30.0 - 36.0 g/dL   RDW 19.1 (H) 47.8 - 29.5 %   Platelets 213 150 - 400 K/uL  Basic metabolic panel  Result Value Ref Range   Sodium 141 135 - 145 mmol/L   Potassium 4.3 3.5 - 5.1 mmol/L   Chloride 94 (L) 101 - 111 mmol/L   CO2 34 (H) 22 - 32 mmol/L   Glucose, Bld 133 (H) 65 - 99 mg/dL   BUN 35 (H) 6 - 20 mg/dL   Creatinine, Ser 6.21 (H) 0.44 - 1.00 mg/dL   Calcium 9.1 8.9 - 30.8 mg/dL   GFR calc non Af Amer 30 (L) >60 mL/min   GFR calc Af Amer 35 (L) >60 mL/min   Anion gap 13 5 - 15  BNP (order ONLY if patient complains of dyspnea/SOB AND you have documented it for THIS visit)  Result Value Ref Range   B Natriuretic Peptide 327.1 (H) 0.0 - 100.0 pg/mL  Protime-INR  Result Value Ref Range   Prothrombin Time 25.0 (H) 11.6 - 15.2 seconds   INR 2.30 (H) 0.00 - 1.49  I-stat troponin, ED   (not at [facility], [facility])  Result Value Ref Range   Troponin i, poc 0.06 0.00 - 0.08 ng/mL   Comment 3           Dg Chest Port 1 View  10/11/2014   CLINICAL DATA:  Shortness of breath for 4 days, history of unspecified CHF, chronic atrial fibrillation, DVT, hypertension, hyperlipidemia, pulmonary embolism, coronary artery disease, abdominal aortic aneurysm, interstitial lung disease  EXAM: PORTABLE CHEST - 1 VIEW  COMPARISON:  Portable exam 1440 hours compared to 08/01/2014  FINDINGS: Enlargement of cardiac silhouette with pulmonary vascular congestion.  Chronic interstitial changes diffusely which could represent CHF though underlying chronic interstitial disease not excluded.  Pleural thickening at the apices unchanged.  No segmental consolidation, pleural effusion or pneumothorax.  Bones demineralized.  IMPRESSION: Enlargement of cardiac silhouette with vascular congestion and question mild pulmonary edema though cannot exclude underlying chronic interstitial lung disease.   Electronically Signed   By: Ulyses Southward M.D.   On: 10/11/2014 14:55     MDM Final diagnoses:  None  will administer Lasix in light of increased dyspnea, weight gain. Patient may not be amenable to nitrates due to borderline low blood pressure.  Renal insufficiency is chronic, but improved over 5/24/16S Poke with cardiology service will come to evaluate patient . Pt requires increased oxygen over baseline of 5l [place] which she wears at home  Dr. Excell Seltzer from cardiology service evaluated patient requested admission to medical service as her dyspnea and hypoxia likely multifactorial. Patient has increased oxygen requirement. She did feel improved after treatment with nebulizer and IV Lasix Spoke with Dr. Benjamine Mola who will arrange for inpatient stay to stepdown unit Dx#1 dyspnea #2 hypoxia #3 chronic renal insufficiency   CRITICAL CARE Performed by: Doug Sou Total critical care time: 30 minutes Critical care time  was exclusive of  separately billable procedures and treating other patients. Critical care was necessary to treat or prevent imminent or life-threatening deterioration. Critical care was time spent personally by me on the following activities: development of treatment plan with patient and/or surrogate as well as nursing, discussions with consultants, evaluation of patient's response to treatment, examination of patient, obtaining history from patient or surrogate, ordering and performing treatments and interventions, ordering and review of laboratory studies, ordering and review of radiographic studies, pulse oximetry and re-evaluation of patient's condition. Doug Sou, MD 10/11/14 4098  Doug Sou, MD 10/11/14 [phone]

## 2014-10-11 NOTE — H&P (Signed)
[facility] History and Physical  [name] [name] ZOX:[id number] DOB: 07-04-1931 DOA: 10/11/2014  Referring physician: er PCP: Kerby Nora, MD   Chief Complaint: SOB  HPI: [name] [name] is a 81 y.o. female  With long PMHX: ILD, diastolic heart failure, morbid obesity She lives with son and gets around with a walker.  She presents to the ER with worsening SOB.  Last week she saw her cardiologist who adjusted her diuretic due to AKI.  She had 4 lbs of weight gain overnight.  She wears 5L of O2 at baseline.   She recently saw Dr. Alan Ripper for a 2nd opinion of her ILD.  He suggested hospice which she declined.  She did increase her prednisone to 30 mg daily which may have contributed to her weight gain.  In the ER, BNP was elevated, x ray showed Enlargement of cardiac silhouette with vascular congestion and question mild pulmonary edema though cannot exclude underlying chronic interstitial lung disease.  cR was improved from 1 week ago   Review of Systems:  All systems reviewed, negative unless stated above   Past Medical History  Diagnosis Date  . Congestive heart failure, unspecified     a. ECHO 6/0: EF 55-60%, mild LVH, mildly dilated RV w. mildly dec fx, RVSP 67  . Chronic atrial fibrillation     failed cardioversion in past  -repeat DC-CV on October 5th, 2010  . Personal history of DVT (deep vein thrombosis) 2008  . Obstructive sleep apnea   . HTN (hypertension)   . Morbid obesity   . Shingles     with postherpetic neuraigia  . GERD (gastroesophageal reflux disease)   . Hyperlipidemia   . Meningitis 1950s  . Gallstones     s/p cholecystectomy  . CAD (coronary artery disease)     nonobstructive by cath 8/10 - LAD 40-50% w mild PAH mean 29 w PVR 3.2 [place]  . AAA (abdominal aortic aneurysm)     small  . PE (pulmonary embolism)     history of  . Anemia   . Shortness of breath dyspnea   . Chronic kidney disease     ckd stage 2  . Lung interstitial disease   .  Hypoxemia    Past Surgical History  Procedure Laterality Date  . Appendectomy  1947  . Tonsillectomy  1946  . Hospitalized with meningitis at [facility]  1960  . Cholecystectomy    . Cardioversion  01/2007    x2  . Right heart catheterization N/A 02/28/2014    Procedure: RIGHT HEART CATH;  Surgeon: Laurey Morale, MD;  Location: [facility] CATH LAB;  Service: Cardiovascular;  Laterality: N/A;   Social History:  reports that she has never smoked. She has never used smokeless tobacco. She reports that she drinks alcohol. She reports that she does not use illicit drugs.  No Known Allergies  Family History  Problem Relation Age of Onset  . Heart failure Mother   . Depression Mother   . Ovarian cancer Paternal Grandmother   . Heart disease Paternal Grandfather   . Obesity Other     Prior to Admission medications   Medication Sig Start Date End Date Taking? Authorizing Provider  allopurinol (ZYLOPRIM) 300 MG tablet Take 1 tablet (300 mg total) by mouth daily. 06/08/14  Yes Amy Michelle Nasuti, MD  aspirin 81 MG tablet Take 81 mg by mouth daily.   Yes Historical Provider, MD  atorvastatin (LIPITOR) 10 MG tablet Take 1 tablet (10 mg  total) by mouth daily. 07/18/14  Yes Amy E Bedsole, MD  budesonide (PULMICORT) 0.25 MG/2ML nebulizer solution Take 0.25 mg by nebulization 2 (two) times daily.   Yes Historical Provider, MD  cetirizine (ZYRTEC ALLERGY) 10 MG tablet Take 10 mg by mouth as needed for allergies.    Yes Historical Provider, MD  colchicine 0.6 MG tablet Take 0.6 mg by mouth as needed (for flareup).  01/11/14  Yes Historical Provider, MD  diltiazem (CARDIZEM CD) 240 MG 24 hr capsule Take 1 capsule (240 mg total) by mouth daily. 08/07/14  Yes Renae FickleMackenzie Short, MD  escitalopram (LEXAPRO) 20 MG tablet Take 1 tablet (20 mg total) by mouth daily. 05/02/14  Yes Amy Michelle NasutiE Bedsole, MD  famotidine (PEPCID) 20 MG tablet Take 1 tablet (20 mg total) by mouth daily. 04/15/14  Yes Antonieta Ibaimothy J Gollan, MD  ferrous sulfate  325 (65 FE) MG tablet Take 1 tablet (325 mg total) by mouth 2 (two) times daily with a meal. 08/07/14  Yes Renae FickleMackenzie Short, MD  gabapentin (NEURONTIN) 300 MG capsule Take 1 capsule (300 mg total) by mouth daily. 08/18/14  Yes Amy Michelle NasutiE Bedsole, MD  OVER THE COUNTER MEDICATION Inhale 1 application into the lungs at bedtime. USES BIPAP EVERY BEDTIME FOR SLEEP   Yes Historical Provider, MD  potassium chloride SA (K-DUR,KLOR-CON) 20 MEQ tablet Take 20 mEq by mouth daily.   Yes Historical Provider, MD  predniSONE (DELTASONE) 5 MG tablet Take 20 mg by mouth daily.  09/09/14  Yes Historical Provider, MD  torsemide (DEMADEX) 20 MG tablet Take 3 tablets (60 mg total) by mouth 2 (two) times daily. Patient taking differently: Take 20 mg by mouth 3 (three) times daily.  08/19/14  Yes Antonieta Ibaimothy J Gollan, MD  warfarin (COUMADIN) 2.5 MG tablet Take as directed. 10/04/14  Yes Amy Michelle NasutiE Bedsole, MD  metolazone (ZAROXOLYN) 5 MG tablet Take 1 tablet (5 mg total) by mouth daily as needed. 08/19/14   Antonieta Ibaimothy J Gollan, MD   Physical Exam: Filed Vitals:   10/11/14 1545 10/11/14 1602 10/11/14 1630 10/11/14 1700  BP: 121/66 112/67 118/72 124/57  Pulse: 78 80 71 70  Temp:      TempSrc:      Resp: 28 24 23 23   SpO2: 94% 96% 90% 93%    Wt Readings from Last 3 Encounters:  10/05/14 132.45 kg (292 lb)  10/04/14 132.677 kg (292 lb 8 oz)  08/23/14 128.822 kg (284 lb)    General:  Mild increase in work of breathing on NRB= able to carry on conversation with some SOB Eyes: PERRL, normal lids, irises & conjunctiva ENT: grossly normal hearing, lips & tongue Neck: obese Cardiovascular: RRR, no m/r/g. No LE edema. Respiratory: diminished with crackles Abdomen: soft, ntnd Skin: healed wound pinkish in color on right leg Musculoskeletal: grossly normal tone BUE/BLE Psychiatric: grossly normal mood and affect, speech fluent and appropriate Neurologic: grossly non-focal.          Labs on Admission:  Basic Metabolic Panel:  Recent  Labs Lab 10/11/14 1431  NA 141  K 4.3  CL 94*  CO2 34*  GLUCOSE 133*  BUN 35*  CREATININE 1.55*  CALCIUM 9.1   Liver Function Tests: No results for input(s): AST, ALT, ALKPHOS, BILITOT, PROT, ALBUMIN in the last 168 hours. No results for input(s): LIPASE, AMYLASE in the last 168 hours. No results for input(s): AMMONIA in the last 168 hours. CBC:  Recent Labs Lab 10/11/14 1431  WBC 14.1*  HGB 14.4  HCT 45.5  MCV 100.9*  PLT 213   Cardiac Enzymes: No results for input(s): CKTOTAL, CKMB, CKMBINDEX, TROPONINI in the last 168 hours.  BNP (last 3 results)  Recent Labs  08/01/14 1850 10/11/14 1431  BNP 658.4* 327.1*    ProBNP (last 3 results)  Recent Labs  02/22/14 0947 02/24/14 0332 03/07/14 0648  PROBNP 2319.0* 2410.0* 437.5    CBG: No results for input(s): GLUCAP in the last 168 hours.  Radiological Exams on Admission: Dg Chest Port 1 View  10/11/2014   CLINICAL DATA:  Shortness of breath for 4 days, history of unspecified CHF, chronic atrial fibrillation, DVT, hypertension, hyperlipidemia, pulmonary embolism, coronary artery disease, abdominal aortic aneurysm, interstitial lung disease  EXAM: PORTABLE CHEST - 1 VIEW  COMPARISON:  Portable exam 1440 hours compared to 08/01/2014  FINDINGS: Enlargement of cardiac silhouette with pulmonary vascular congestion.  Chronic interstitial changes diffusely which could represent CHF though underlying chronic interstitial disease not excluded.  Pleural thickening at the apices unchanged.  No segmental consolidation, pleural effusion or pneumothorax.  Bones demineralized.  IMPRESSION: Enlargement of cardiac silhouette with vascular congestion and question mild pulmonary edema though cannot exclude underlying chronic interstitial lung disease.   Electronically Signed   By: Ulyses Southward M.D.   On: 10/11/2014 14:55    EKG: Independently reviewed.   Assessment/Plan Active Problems:   Essential hypertension   Atrial  fibrillation   CKD (chronic kidney disease) stage 3, GFR 30-59 ml/min   Acute on chronic diastolic heart failure   Acute on chronic respiratory failure with hypoxemia   ILD (interstitial lung disease)   Hypoxia   Acute on chronic resp failure-  -usually on 5 L O2 at home- now on NRB  Acute on diastolic CHF- recent dose adjustment by renal -recent echo -BNP elevated but less than previous admission -cards consult -lasix IV  ILD -seen by McQuaid/ Ramiswammey -poor prognosis -last note from [facility] 5/25 -on chronic steroids - if breathing not improved to baseline with diuresis- will need pulm consult  HTN   May need pallaitive care consult if not improving- patient is full code. ? If this is worsening of the ILD Poor overall prognosis  Cards consulted in ER   Code Status: full (would not want PEG or Trach) DVT Prophylaxis: Family Communication: son at bedside Disposition Plan:   Time spent: 75 min  Marlin Canary [facility] Pager [phone]

## 2014-10-11 NOTE — Telephone Encounter (Signed)
Pt c/o Shortness Of Breath: STAT if SOB developed within the last 24 hours or pt is noticeably SOB on the phone  1. Are you currently SOB (can you hear that pt is SOB on the phone)? Just a little   2. How long have you been experiencing SOB? All weekend, since we changed up medication   3. Are you SOB when sitting or when up moving around? Sitting mainly, if she moves her ox drops to 50 to 60's   4. Are you currently experiencing any other symptoms? And if she stands up her eyes get cloudy and feels like she will fall.

## 2014-10-11 NOTE — Progress Notes (Signed)
RT placed pt on auto titrate CPAP with 6Lpm of oxygen bled in via ffm. Sterile water was added for humidification. Pt states she is comfortable and is tolerating CPAP well at this time. RT will continue to monitor as needed.

## 2014-10-11 NOTE — ED Notes (Signed)
Pt reports to the ED for eval of SOB and bilateral lower extremity edema. She was seen by her MD on Friday and her doctor reduced her torsemide from 120 mg to 60 mg. The SOB became worse today. Upon EMS arrival pts O2 sats were in the 70s on her usual home O2 at 6 L via nasal cannula. She was placed on a NBR at 15 L and her O2 sats improved to the mid 90s. She states that since Friday she has had SOB and increased swelling. Denies any CP or N/V. 12 lead en route unremarkable. Pt A&Ox4. Skin warm and dry. Resp rapid.

## 2014-10-11 NOTE — ED Notes (Signed)
Portable DG at bedside.  

## 2014-10-11 NOTE — ED Notes (Signed)
Pt placed in a gown and hooked up to the monitor with 5 lead, BP cuff and pulse ox 

## 2014-10-11 NOTE — ED Notes (Addendum)
Upon standing to use the bedside commode the patient's O2 sats dropped to the mid 80s on the partial rebreather. Admission MD made aware and she states the patient will likely need a foley catheter.

## 2014-10-11 NOTE — Telephone Encounter (Signed)
Spoke w/ Toni Amendourtney.  She states that pt has worsening SOB, is up to 7L O2 and SpO2 has not gone above 68%. Advised her to hang up the phone and call 911. She states that pt called this am and has delayed treatment as she thinks Dr. Mariah MillingGollan can change her meds and alleviate her sx. Advised her that I have spoken w/ Dr. Mariah MillingGollan and he is hesitant to increase pt's diuretics due to her worsening kidney function. She states that pt will not let her call 911. Advised her that pt has the right to refuse treatment, but as her caregiver, it is her responsibility to make sure that pt gets the care that she needs.  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 She verbalizes understanding and is agreeable to calling 911 for pt and will call back if we can be of further assistance.

## 2014-10-12 ENCOUNTER — Inpatient Hospital Stay (HOSPITAL_COMMUNITY): Payer: Medicare Other

## 2014-10-12 DIAGNOSIS — N183 Chronic kidney disease, stage 3 (moderate): Secondary | ICD-10-CM

## 2014-10-12 DIAGNOSIS — J849 Interstitial pulmonary disease, unspecified: Secondary | ICD-10-CM

## 2014-10-12 LAB — GLUCOSE, CAPILLARY
GLUCOSE-CAPILLARY: 126 mg/dL — AB (ref 65–99)
Glucose-Capillary: 95 mg/dL (ref 65–99)
Glucose-Capillary: 119 mg/dL — ABNORMAL HIGH (ref 65–99)
Glucose-Capillary: 152 mg/dL — ABNORMAL HIGH (ref 65–99)

## 2014-10-12 LAB — BASIC METABOLIC PANEL
Anion gap: 12 (ref 5–15)
BUN: 31 mg/dL — AB (ref 6–20)
CALCIUM: 8.8 mg/dL — AB (ref 8.9–10.3)
CO2: 36 mmol/L — ABNORMAL HIGH (ref 22–32)
Chloride: 94 mmol/L — ABNORMAL LOW (ref 101–111)
Creatinine, Ser: 1.48 mg/dL — ABNORMAL HIGH (ref 0.44–1.00)
GFR calc non Af Amer: 32 mL/min — ABNORMAL LOW (ref >60)
GFR, EST AFRICAN AMERICAN: 37 mL/min — AB (ref >60)
Glucose, Bld: 96 mg/dL (ref 65–99)
Potassium: 3.2 mmol/L — ABNORMAL LOW (ref 3.5–5.1)
Sodium: 142 mmol/L (ref 135–145)

## 2014-10-12 LAB — BLOOD GAS, ARTERIAL
ACID-BASE EXCESS: 11.5 mmol/L — AB (ref 0.0–2.0)
Bicarbonate: 36.2 mEq/L — ABNORMAL HIGH (ref 20.0–24.0)
DRAWN BY: 31814
Delivery systems: POSITIVE
Expiratory PAP: 20
MODE: POSITIVE
O2 SAT: 91.2 %
PCO2 ART: 53.1 mmHg — AB (ref 35.0–45.0)
Patient temperature: 97.5
TCO2: 37.9 mmol/L (ref 0–100)
pH, Arterial: 7.446 (ref 7.350–7.450)
pO2, Arterial: 60.6 mmHg — ABNORMAL LOW (ref 80.0–100.0)

## 2014-10-12 LAB — MRSA PCR SCREENING: MRSA BY PCR: POSITIVE — AB

## 2014-10-12 LAB — PROTIME-INR
INR: 2.02 — AB (ref 0.00–1.49)
Prothrombin Time: 22.8 seconds — ABNORMAL HIGH (ref 11.6–15.2)

## 2014-10-12 LAB — TROPONIN I
TROPONIN I: 0.05 ng/mL — AB (ref <0.031)
Troponin I: 0.04 ng/mL — ABNORMAL HIGH (ref <0.031)

## 2014-10-12 MED ORDER — BUDESONIDE 0.25 MG/2ML IN SUSP
0.5000 mg | Freq: Two times a day (BID) | RESPIRATORY_TRACT | Status: DC
  Administered 2014-10-12 – 2014-10-13 (×3): 0.5 mg via RESPIRATORY_TRACT
  Administered 2014-10-14: 5 mg via RESPIRATORY_TRACT
  Administered 2014-10-14 – 2014-10-15 (×2): 0.5 mg via RESPIRATORY_TRACT
  Filled 2014-10-12 (×7): qty 4

## 2014-10-12 MED ORDER — IPRATROPIUM-ALBUTEROL 0.5-2.5 (3) MG/3ML IN SOLN
RESPIRATORY_TRACT | Status: AC
  Filled 2014-10-12: qty 3

## 2014-10-12 MED ORDER — METHYLPREDNISOLONE SODIUM SUCC 40 MG IJ SOLR
40.0000 mg | Freq: Three times a day (TID) | INTRAMUSCULAR | Status: DC
Start: 2014-10-12 — End: 2014-10-14
  Administered 2014-10-12 – 2014-10-14 (×6): 40 mg via INTRAVENOUS
  Filled 2014-10-12 (×6): qty 1

## 2014-10-12 MED ORDER — IPRATROPIUM-ALBUTEROL 0.5-2.5 (3) MG/3ML IN SOLN
3.0000 mL | RESPIRATORY_TRACT | Status: DC
  Administered 2014-10-12: 3 mL via RESPIRATORY_TRACT

## 2014-10-12 MED ORDER — WARFARIN SODIUM 2.5 MG PO TABS
2.5000 mg | ORAL_TABLET | Freq: Once | ORAL | Status: AC
  Administered 2014-10-12: 2.5 mg via ORAL
  Filled 2014-10-12: qty 1

## 2014-10-12 MED ORDER — IPRATROPIUM-ALBUTEROL 0.5-2.5 (3) MG/3ML IN SOLN
3.0000 mL | Freq: Four times a day (QID) | RESPIRATORY_TRACT | Status: DC
  Administered 2014-10-12 – 2014-10-18 (×25): 3 mL via RESPIRATORY_TRACT
  Filled 2014-10-12 (×25): qty 3

## 2014-10-12 MED ORDER — MUPIROCIN 2 % EX OINT
1.0000 "application " | TOPICAL_OINTMENT | Freq: Two times a day (BID) | CUTANEOUS | Status: AC
  Administered 2014-10-13 – 2014-10-17 (×9): 1 via NASAL
  Filled 2014-10-12: qty 22

## 2014-10-12 MED ORDER — FUROSEMIDE 10 MG/ML IJ SOLN
80.0000 mg | Freq: Three times a day (TID) | INTRAMUSCULAR | Status: DC
  Administered 2014-10-12 – 2014-10-20 (×24): 80 mg via INTRAVENOUS
  Filled 2014-10-12 (×24): qty 8

## 2014-10-12 MED ORDER — FUROSEMIDE 10 MG/ML IJ SOLN
40.0000 mg | Freq: Once | INTRAMUSCULAR | Status: AC
  Administered 2014-10-12: 40 mg via INTRAVENOUS

## 2014-10-12 MED ORDER — METHYLPREDNISOLONE SODIUM SUCC 40 MG IJ SOLR: 40.0000 mg | Freq: Two times a day (BID) | INTRAMUSCULAR | Status: DC

## 2014-10-12 MED ORDER — CHLORHEXIDINE GLUCONATE CLOTH 2 % EX PADS
6.0000 | MEDICATED_PAD | Freq: Every day | CUTANEOUS | Status: AC
  Administered 2014-10-12 – 2014-10-16 (×5): 6 via TOPICAL

## 2014-10-12 MED ORDER — FUROSEMIDE 10 MG/ML IJ SOLN
INTRAMUSCULAR | Status: AC
  Administered 2014-10-12: 40 mg via INTRAVENOUS
  Filled 2014-10-12: qty 4

## 2014-10-12 NOTE — Progress Notes (Signed)
[name] CONSULT NOTE - Follow up  Pharmacy Consult for Coumadin Indication: atrial fibrillation  No Known Allergies  Patient Measurements: Height: 5\' 4"  (162.6 cm) Weight: 271 lb 6.4 oz (123.106 kg) IBW/kg (Calculated) : 54.7  Vital Signs: [name]: 97.3 F (36.3 C) (06/01 0729) [name] Source: Axillary (06/01 0729) BP: 110/83 mmHg (06/01 0730) Pulse Rate: 71 (06/01 0730)  Labs:  Recent Labs  10/11/14 1431 10/11/14 2036 10/12/14 0049 10/12/14 0802  HGB 14.4  --   --   --   HCT 45.5  --   --   --   PLT 213  --   --   --   LABPROT 25.0*  --   --  22.8*  INR 2.30*  --   --  2.02*  CREATININE 1.55*  --   --  1.48*  TROPONINI  --  0.04* 0.04* 0.05*    Estimated Creatinine Clearance: 38.6 mL/min (by C-G formula based on Cr of 1.48).   Medical History: Past Medical History  Diagnosis Date  . Congestive heart failure, unspecified     a. ECHO 6/0: EF 55-60%, mild LVH, mildly dilated RV w. mildly dec fx, RVSP 67  . Chronic atrial fibrillation     failed cardioversion in past  -repeat DC-CV on October 5th, 2010  . Personal history of DVT (deep vein thrombosis) 2008    LLE  . HTN (hypertension)   . Morbid obesity   . Shingles     with postherpetic neuraigia  . GERD (gastroesophageal reflux disease)   . Hyperlipidemia   . Meningitis ~ 1960    [facility]  . Gallstones     s/p cholecystectomy  . CAD (coronary artery disease)     nonobstructive by cath 8/10 - LAD 40-50% w mild PAH mean 29 w PVR 3.2 [place]  . PE (pulmonary embolism) ~ 2010    LLE  . Shortness of breath dyspnea   . Lung interstitial disease   . Hypoxemia   . COPD (chronic obstructive pulmonary disease)   . On home oxygen therapy     "5L; 24/7" (10/11/2014)  . OSA treated with BiPAP     "I think it's BiPAP"  . AAA (abdominal aortic aneurysm)     small  . History of blood transfusion   . Anemia   . Iron deficiency anemia   . History of blood transfusion     "related to nose  bleed"  . Gout   . Depression   . Chronic kidney disease (CKD), stage II (mild)    Assessment:   81 y.o obese female with ILD and CHF, admitted 5/31 PM with 1 week history of shortness of breath, weight gain and BLE edema. IV diuresis begun.  She is on chronic coumadin PTA  for atrial fibrillation.  INR is therapeutic at 2.3 on admit 10/11/14. Today INR is 2.02, remains therapeutic. No bleeding noted.  -Pt also with hx DVT and PE in 2007, and hospitalization for epistaxis in March 2015.  -Home regimen per daughter:  2.5 mg MWFSat, 3.75 mg TTSun.   Last dose 5/30 pm.  Outpatient INR 2.8 on 09/29/14  Goal of Therapy:  INR 2-3 Monitor platelets by [name] protocol: Yes   Plan:  Coumadin 2.5 mg tonight x1. PT/INR daily for now.  Noah Delaineuth Walterine Amodei, RPh Clinical Pharmacist Pager: [phone] 10/12/2014,12:29 PM

## 2014-10-12 NOTE — Progress Notes (Signed)
Called by primary RN to see pt for decreased O2 sats.  Pt was asleep on cpap when sats dropped.  Pt placed on NRB & sats increased to 99%.  Pt is alert & oriented & joking about it being her birthday.  Pt placed on cpap with increased PS but still low sats.  Discussed with Donnamarie PoagK. Kirby, NP [facility]. Pt resting comfortably on NRB without increased WOB.

## 2014-10-12 NOTE — Progress Notes (Signed)
RT note: RN notified RT this a.m. of scheduled aerosol tx. wanting to be given, once RT able to arrive to room RN giving pt. aerosol tx. RT stopped back to see pt. once first step-down units assignment/priority pts. seen with aerosol txs. given, pt. found on PRB mask @ 15 lpm was alert with sats of 91%, Dr. Waymon AmatoHongalgi in to see pt., spoke with CCM-Dr. Molli KnockYacoub about pt. status, then RT gave new ordered aerosol, (Duoneb) then scheduled steroid nebulizer, (Pulmicort) and placed on CPAP Auto, per pt. wanting to rest from last nite, max pressure of 20 cmh20 displayed, 02 sats 86-88% with 15 lpm Oxygen being blended in, from CCM consult-placed bipap order, placed pt. on Bipap Vision @ 16/8/bur-20/24 pt. rate/Fi02 50% maintaining sats 92% with Vt's of 700's, pt. Tolerating well, RT to monitor.

## 2014-10-12 NOTE — Progress Notes (Signed)
[facility]  Patient Status: Active (receiving services up to time of hospitalization)  [name] is providing the following services: RN  If patient discharges after hours, please call [phone].   [name] FurnishDonna [name] 10/12/2014, 10:25 AM

## 2014-10-12 NOTE — Progress Notes (Addendum)
RN paged earlier in shift requesting order for CPAP since pt uses at home. Pt could not remember home CPAP settings, so RT worked with settings. Later, RN paged because pt's O2 sat decreased to the 40s on the CPAP. CPAP removed and NRB placed with normal O2 sats. RT adjusted settings again and afterwards, RN paged again stating pt was satting anywhere between 82-88% on new CPAP settings. NP called RN and asked him to remove CPAP and place NRB. Sats up to 95%. Rapid response saw pt and pt was alert, new it was her birthday, had no increased WOB and was satting 99% at that time.  ABG ordered. CXR ordered. Pt may require more Lasix as she was admitted with acute on chronic respiratory failure with CHF decompensation. Has had about 120mg  of Lasix IV in past 12 hours. Pt also has chronic ILD seen by pulmonary outpt. Further management depending on response and test results. Will follow. Jimmye NormanKaren Kirby-Graham, NP [facility] Update: CXR showed mild edema. Gave pt extra 40mg  dose of Lasix IV.  Update: RN paged stating pt had increased WOB. NP to bedside. Pt with eyes closed but easily arousable. Oriented, smiling. No increased WOB. RR 24. Lung sounds diminished but pt is obese and this NP does not appreciate any crackles or wheezes. Pt continues to be stable enough to remain on floor and off bipap. Has had UO since Lasix. KJKG, NP

## 2014-10-12 NOTE — Consult Note (Signed)
PULMONARY / CRITICAL CARE MEDICINE   Name: [name] [name] MRN: [id number] DOB: July 23, 1931    ADMISSION DATE:  10/11/2014 CONSULTATION DATE:  10/12/14  REFERRING MD :  Waymon Amato   CHIEF COMPLAINT:  Respiratory failure   INITIAL PRESENTATION:  81yo female with multiple medical problems including Afib, HTN, dCHF, CKD, chronic respiratory failure r/t COPD, OSA/OHS and ILD (on chronic prednisone) on 5L home O2.  She presented 5/31 with 1 week hx increased SOB, weight gain and BLE edema.  Admitted by [facility] and seen by cardiology for acute on chronic diastolic CHF.  Diuresed gently but overnight continued to worsen requiring NRB and PCCM consulted.   STUDIES:    SIGNIFICANT EVENTS:    HISTORY OF PRESENT ILLNESS:  81yo female with multiple medical problems including Afib, HTN, dCHF, CKD, chronic respiratory failure r/t COPD, OSA/OHS and ILD (on chronic prednisone) on 5L home O2.  She presented 5/31 with 1 week hx increased SOB, weight gain and BLE edema.  Admitted by [facility] and seen by cardiology for acute on chronic diastolic CHF.  Diuresed gently but overnight continued to worsen requiring NRB and PCCM consulted.   Currently feeling somewhat better.  Does c/o BLE edema, orthopnea and dry cough.  Denies chest pain, purulent sputum, fevers, chills, leg/calf pain.  Has been compliant with CPAP at home.  Recently weaned from 30 mg to 20mg  of prednisone which she did not tolerate and she bumped herself back to 30mg  daily int he last week or so.     PAST MEDICAL HISTORY :   has a past medical history of Congestive heart failure, unspecified; Chronic atrial fibrillation; Personal history of DVT (deep vein thrombosis) (2008); HTN (hypertension); Morbid obesity; Shingles; GERD (gastroesophageal reflux disease); Hyperlipidemia; Meningitis (~ 1960); Gallstones; CAD (coronary artery disease); PE (pulmonary embolism) (~ 2010); Shortness of breath dyspnea; Lung interstitial disease; Hypoxemia; COPD (chronic  obstructive pulmonary disease); On home oxygen therapy; OSA treated with BiPAP; AAA (abdominal aortic aneurysm); History of blood transfusion; Anemia; Iron deficiency anemia; History of blood transfusion; Gout; Depression; and Chronic kidney disease (CKD), stage II (mild).  has past surgical history that includes Appendectomy (1947); Tonsillectomy (1946); Cardioversion (01/2007); right heart catheterization (N/A, 02/28/2014); Cardiac catheterization (02/2014); and Cataract extraction w/ intraocular lens  implant, bilateral (Bilateral). Prior to Admission medications   Medication Sig Start Date End Date Taking? Authorizing Provider  allopurinol (ZYLOPRIM) 300 MG tablet Take 1 tablet (300 mg total) by mouth daily. 06/08/14  Yes Amy Michelle Nasuti, MD  aspirin 81 MG tablet Take 81 mg by mouth daily.   Yes Historical Provider, MD  atorvastatin (LIPITOR) 10 MG tablet Take 1 tablet (10 mg total) by mouth daily. 07/18/14  Yes Amy E Bedsole, MD  budesonide (PULMICORT) 0.25 MG/2ML nebulizer solution Take 0.25 mg by nebulization 2 (two) times daily.   Yes Historical Provider, MD  cetirizine (ZYRTEC ALLERGY) 10 MG tablet Take 10 mg by mouth as needed for allergies.    Yes Historical Provider, MD  colchicine 0.6 MG tablet Take 0.6 mg by mouth as needed (for flareup).  01/11/14  Yes Historical Provider, MD  diltiazem (CARDIZEM CD) 240 MG 24 hr capsule Take 1 capsule (240 mg total) by mouth daily. 08/07/14  Yes Renae Fickle, MD  escitalopram (LEXAPRO) 20 MG tablet Take 1 tablet (20 mg total) by mouth daily. 05/02/14  Yes Amy Michelle Nasuti, MD  famotidine (PEPCID) 20 MG tablet Take 1 tablet (20 mg total) by mouth daily. 04/15/14  Yes Antonieta Iba,  MD  ferrous sulfate 325 (65 FE) MG tablet Take 1 tablet (325 mg total) by mouth 2 (two) times daily with a meal. 08/07/14  Yes Renae Fickle, MD  gabapentin (NEURONTIN) 300 MG capsule Take 1 capsule (300 mg total) by mouth daily. 08/18/14  Yes Amy Michelle Nasuti, MD  OVER THE COUNTER  MEDICATION Inhale 1 application into the lungs at bedtime. USES BIPAP EVERY BEDTIME FOR SLEEP   Yes Historical Provider, MD  potassium chloride SA (K-DUR,KLOR-CON) 20 MEQ tablet Take 20 mEq by mouth daily.   Yes Historical Provider, MD  predniSONE (DELTASONE) 5 MG tablet Take 20 mg by mouth daily.  09/09/14  Yes Historical Provider, MD  torsemide (DEMADEX) 20 MG tablet Take 3 tablets (60 mg total) by mouth 2 (two) times daily. Patient taking differently: Take 20 mg by mouth 3 (three) times daily.  08/19/14  Yes Antonieta Iba, MD  warfarin (COUMADIN) 2.5 MG tablet Take as directed. 10/04/14  Yes Amy Michelle Nasuti, MD  metolazone (ZAROXOLYN) 5 MG tablet Take 1 tablet (5 mg total) by mouth daily as needed. 08/19/14   Antonieta Iba, MD   No Known Allergies  FAMILY HISTORY:  indicated that her mother is deceased. She indicated that her father is deceased.  SOCIAL HISTORY:  reports that she has never smoked. She has never used smokeless tobacco. She reports that she drinks alcohol. She reports that she does not use illicit drugs.  REVIEW OF SYSTEMS:  As per HPI - All other systems reviewed and were neg.    SUBJECTIVE:   VITAL SIGNS: Temp:  [97.3 F (36.3 C)-99.3 F (37.4 C)] 97.3 F (36.3 C) (06/01 0729) Pulse Rate:  [66-85] 71 (06/01 0730) Resp:  [15-30] 21 (06/01 0730) BP: (109-137)/(56-95) 110/83 mmHg (06/01 0730) SpO2:  [48 %-97 %] 96 % (06/01 0730) FiO2 (%):  [40 %-100 %] 100 % (06/01 0510) Weight:  [271 lb 6.4 oz (123.106 kg)] 271 lb 6.4 oz (123.106 kg) (05/31 1852) HEMODYNAMICS:   VENTILATOR SETTINGS: Vent Mode:  [-]  FiO2 (%):  [40 %-100 %] 100 % INTAKE / OUTPUT:  Intake/Output Summary (Last 24 hours) at 10/12/14 0935 Last data filed at 10/12/14 0538  Gross per 24 hour  Intake      0 ml  Output   2050 ml  Net  -2050 ml    PHYSICAL EXAMINATION: General:  Pleasant, obese female, NAD  Neuro:  Awake, alert, appropriate, MAE, gen weakness  HEENT:  Mm moist, NRB   Cardiovascular:  s1s2 rrr, distant  Lungs:  resps even, mildly tachypneic on NRB, crackles bilat bases, faint scattered wheeze  Abdomen:  Obese, soft, non tender  Musculoskeletal:  Warm and dry, 2-3+ BLE edema, venous stasis   LABS:  CBC  Recent Labs Lab 10/11/14 1431  WBC 14.1*  HGB 14.4  HCT 45.5  PLT 213   Coag's  Recent Labs Lab 10/11/14 1431 10/12/14 0802  INR 2.30* 2.02*   BMET  Recent Labs Lab 10/11/14 1431 10/12/14 0802  NA 141 142  K 4.3 3.2*  CL 94* 94*  CO2 34* 36*  BUN 35* 31*  CREATININE 1.55* 1.48*  GLUCOSE 133* 96   Electrolytes  Recent Labs Lab 10/11/14 1431 10/12/14 0802  CALCIUM 9.1 8.8*   Sepsis Markers No results for input(s): LATICACIDVEN, PROCALCITON, O2SATVEN in the last 168 hours. ABG  Recent Labs Lab 10/12/14 0222  PHART 7.446  PCO2ART 53.1*  PO2ART 60.6*   Liver Enzymes No results for input(s):  AST, ALT, ALKPHOS, BILITOT, ALBUMIN in the last 168 hours. Cardiac Enzymes  Recent Labs Lab 10/11/14 2036 10/12/14 0049 10/12/14 0802  TROPONINI 0.04* 0.04* 0.05*   Glucose  Recent Labs Lab 10/11/14 1851 10/11/14 2055 10/12/14 0730  GLUCAP 139* 174* 95    Imaging Dg Chest Port 1 View  10/12/2014   CLINICAL DATA:  Worsening shortness of breath and decreased O2 saturation. Personal history of pulmonary edema. Initial encounter.  EXAM: PORTABLE CHEST - 1 VIEW  COMPARISON:  Chest radiograph performed 10/11/2014  FINDINGS: The right costophrenic angle is incompletely imaged on this study. Small bilateral pleural effusions are seen. Underlying vascular congestion and diffusely increased interstitial markings are compatible with pulmonary edema, similar in appearance to the prior study. No pneumothorax is seen.  The cardiomediastinal silhouette is mildly enlarged. No acute osseous abnormalities are identified.  IMPRESSION: Small bilateral pleural effusions noted. Underlying vascular congestion and mild cardiomegaly, with  diffusely increased interstitial markings, compatible with mild persistent pulmonary edema.   Electronically Signed   By: Roanna RaiderJeffery  Chang M.D.   On: 10/12/2014 03:33   Dg Chest Port 1 View  10/11/2014   CLINICAL DATA:  Shortness of breath for 4 days, history of unspecified CHF, chronic atrial fibrillation, DVT, hypertension, hyperlipidemia, pulmonary embolism, coronary artery disease, abdominal aortic aneurysm, interstitial lung disease  EXAM: PORTABLE CHEST - 1 VIEW  COMPARISON:  Portable exam 1440 hours compared to 08/01/2014  FINDINGS: Enlargement of cardiac silhouette with pulmonary vascular congestion.  Chronic interstitial changes diffusely which could represent CHF though underlying chronic interstitial disease not excluded.  Pleural thickening at the apices unchanged.  No segmental consolidation, pleural effusion or pneumothorax.  Bones demineralized.  IMPRESSION: Enlargement of cardiac silhouette with vascular congestion and question mild pulmonary edema though cannot exclude underlying chronic interstitial lung disease.   Electronically Signed   By: Ulyses SouthwardMark  Boles M.D.   On: 10/11/2014 14:55     ASSESSMENT / PLAN:  PULMONARY Acute on chronic respiratory failure  ILD - no surgical bx r/t multiple medical problems/ CHF but thought likely IPF. Trace pos ANA and SSA but rapid worsening of symptoms since January despite prednisone.  Pulmonary edema  P:    Supplemental O2 as needed Bipap PRN for increased WOB  F/u CXR  Continue budesonide, change to 0.5mg  BID  Continue duonebs - change to q6h  Continue PRN albuterol  Continue solumedrol - increase to 40mg  TID (baseline 20-30mg  daily pred)    CARDIOVASCULAR Acute on chronic diastolic CHF  A-Fib  P:  Aggressive diuresis per cards as BP and Scr allow  Continue diltiazem  Trend troponin  F/u BNP    RENAL CKD III - baseline Scr ~1.3 P:   Monitor renal function with diuresis  F/u chem   GASTROINTESTINAL No active issue  P:   Heart  healthy diet  pepcid   HEMATOLOGIC Chronic anticoagulation r/t AFib  P:  Continue coumadin  F/u INR   INFECTIOUS No indication active infection  P:   Monitor wbc, fever curve off abx  Note chronic steroids   ENDOCRINE DM  P:   SSI   NEUROLOGIC No active issue  P:   Monitor mental status with respiratory failure    FAMILY  - Updates: no family available 6/1.  Pt updated at length.   Discussed goals of care briefly with pt (she has also d/w Dr. Excell Seltzerooper and Dr. Waymon AmatoHongalgi).  Palliative care to see her later today.  She wishes to remain full code for now  but is willing and open to meeting with palliative care to discuss further.  She understands that if she were to require mechanical ventilation, the likelihood that she would be successfully extubated and back to her baseline functional status are low given her multiple co morbidities and chronic lung disease.  Will continue to follow.   Dirk Dress, NP 10/12/2014  9:35 AM Pager: [phone] or [phone]  81 year old, acute hypoxemic respiratory failure due to pulmonary edema.  Unable to maintain sats on 100% NRB.  Consulted for respiratory failure.  Diffuse crackles on exam.  Patient has multiple comorbidities and is unlikely to be liberated from the ventilator if intubated.  Very poor candidate for aggressive measures and resuscitation.  Discussed at length with patient.  Does not want to "die" on her birthday which is today.  Open to meeting with palliative care however.  Will place on BiPAP for now and keep in SDU.  Aggressive diureses and will recheck after patient has been seen by palliative care.  The patient is critically ill with multiple organ systems failure and requires high complexity decision making for assessment and support, frequent evaluation and titration of therapies, application of advanced monitoring technologies and extensive interpretation of multiple databases.   Critical Care Time devoted to patient  care services described in this note is  35  Minutes. This time reflects time of care of this signee Dr Koren Bound. This critical care time does not reflect procedure time, or teaching time or supervisory time of PA/NP/Med student/Med Resident etc but could involve care discussion time.  Alyson Reedy, M.D. [facility] Pulmonary/Critical Care Medicine. Pager: [phone]. After hours pager: [phone].

## 2014-10-12 NOTE — Progress Notes (Signed)
PROGRESS NOTE    [name] [name] ZOX:[id number] DOB: 12/10/1931 DOA: 10/11/2014 PCP: [name], MD  HPI/Brief narrative 81 year old female with history of interstitial lung disease, A. fib, HTN, CKD, chronic diastolic CHF, morbid obesity, chronic respiratory failure on oxygen 5 L/m at home, lives with son and ambulates with the help of a walker, admitted on 10/11/14 with worsening dyspnea. She had recently seen her cardiologist who adjusted down her diuretic dose due to renal insufficiency. She has since gained 4 pounds of weight. She was recently seen by pulmonology for second opinion of her ILD and hospice was recommended which she declined. Prednisone was also in increased. In the ED, BNP elevated, chest x-ray showed cardiomegaly with vascular congestion and possible pulmonary edema. Cardiology was consulted and increased her Lasix dose. On 6/1, she became acutely dyspneic but improved after nebulizations. Pulmonary consulted.   Assessment/Plan:  Acute on chronic hypoxic respiratory failure - Most likely secondary to decompensated CHF complicating underlying COPD and IPF - Treat underlying cause. Titrate oxygen down as tolerated - Pulmonary consultation appreciated: Very poor candidate for aggressive measures or resuscitation. Patient wishes to be a full code but if intubated may not be able to come off it. - BiPAP when necessary. Changed D.O. nebs to 6 hourly and added IV Solu-Medrol.  Acute on chronic diastolic CHF - Continue IV Lasix 80 mg twice a day. - Diuresing well. - Cardiology input appreciated.  Paroxysmal A. Fib - Sinus rhythm on monitor. Continue diltiazem and Coumadin per pharmacy  Acute on stage III chronic kidney disease  - Monitor creatinine closely while on diuretics   Hypokalemia - Replace and follow  Failure to thrive - Multifactorial due to complex multiple medical conditions. Discussed at length with her this morning. She wishes to remain full code but is  agreeable to palliative care consultation for goals of care-called  DVT prophylaxis: Anticoagulated on Coumadin.  Code Status: Full Family Communication: None at bedside Disposition Plan: DC home when medically stable.   Consultants:  Cardiology  PCCM  Palliative care medicine  Procedures:  BIPAP PRN  Antibiotics:  None   Subjective: Patient states that she had improved compared to admission until this morning when she started having acute dyspnea. This however improved after nebulizations.   Objective: Filed Vitals:   10/12/14 1351 10/12/14 1412 10/12/14 1420 10/12/14 1647  BP:      Pulse: 82     Temp:    98.2 F (36.8 C)  TempSrc:    Oral  Resp: 24     Height:      Weight:      SpO2: 92% 91% 90%     Intake/Output Summary (Last 24 hours) at 10/12/14 1844 Last data filed at 10/12/14 1400  Gross per 24 hour  Intake      0 ml  Output   3150 ml  Net  -3150 ml   Filed Weights   10/11/14 1852  Weight: 123.106 kg (271 lb 6.4 oz)     Exam:  General exam: Morbidly obese female lying propped up in bed with mild increased work of breathing Respiratory system: reduced breath sounds bilaterally with scattered occasional expiratory rhonchi and basal crackles. Mild increased work of breathing. Cardiovascular system: S1 & S2 heard, RRR. No JVD, murmurs, gallops, clicks. 1+ pitting bilateral leg edema. Telemetry: Sinus rhythm  Gastrointestinal system: Abdomen is nondistended, soft and nontender. Normal bowel sounds heard. Central nervous system: Alert and oriented. No focal neurological deficits. Extremities: Symmetric 5 x  5 power.   Data Reviewed: Basic Metabolic Panel:  Recent Labs Lab 10/11/14 1431 10/12/14 0802  NA 141 142  K 4.3 3.2*  CL 94* 94*  CO2 34* 36*  GLUCOSE 133* 96  BUN 35* 31*  CREATININE 1.55* 1.48*  CALCIUM 9.1 8.8*   Liver Function Tests: No results for input(s): AST, ALT, ALKPHOS, BILITOT, PROT, ALBUMIN in the last 168 hours. No  results for input(s): LIPASE, AMYLASE in the last 168 hours. No results for input(s): AMMONIA in the last 168 hours. CBC:  Recent Labs Lab 10/11/14 1431  WBC 14.1*  HGB 14.4  HCT 45.5  MCV 100.9*  PLT 213   Cardiac Enzymes:  Recent Labs Lab 10/11/14 2036 10/12/14 0049 10/12/14 0802  TROPONINI 0.04* 0.04* 0.05*   BNP (last 3 results)  Recent Labs  02/22/14 0947 02/24/14 0332 03/07/14 0648  PROBNP 2319.0* 2410.0* 437.5   CBG:  Recent Labs Lab 10/11/14 1851 10/11/14 2055 10/12/14 0730 10/12/14 1126 10/12/14 1647  GLUCAP 139* 174* 95 119* 126*    Recent Results (from the past 240 hour(s))  MRSA PCR Screening     Status: Abnormal   Collection Time: 10/11/14  6:49 PM  Result Value Ref Range Status   MRSA by PCR POSITIVE (A) NEGATIVE Final    Comment:        The GeneXpert MRSA Assay (FDA approved for NASAL specimens only), is one component of a comprehensive MRSA colonization surveillance program. It is not intended to diagnose MRSA infection nor to guide or monitor treatment for MRSA infections. RESULT CALLED TO, READ BACK BY AND VERIFIED WITH: RALPH,C RN 0031 10/12/14 MITCHELL,L          Studies: Dg Chest Port 1 View  10/12/2014   CLINICAL DATA:  Worsening shortness of breath and decreased O2 saturation. Personal history of pulmonary edema. Initial encounter.  EXAM: PORTABLE CHEST - 1 VIEW  COMPARISON:  Chest radiograph performed 10/11/2014  FINDINGS: The right costophrenic angle is incompletely imaged on this study. Small bilateral pleural effusions are seen. Underlying vascular congestion and diffusely increased interstitial markings are compatible with pulmonary edema, similar in appearance to the prior study. No pneumothorax is seen.  The cardiomediastinal silhouette is mildly enlarged. No acute osseous abnormalities are identified.  IMPRESSION: Small bilateral pleural effusions noted. Underlying vascular congestion and mild cardiomegaly, with  diffusely increased interstitial markings, compatible with mild persistent pulmonary edema.   Electronically Signed   By: Roanna Raider M.D.   On: 10/12/2014 03:33   Dg Chest Port 1 View  10/11/2014   CLINICAL DATA:  Shortness of breath for 4 days, history of unspecified CHF, chronic atrial fibrillation, DVT, hypertension, hyperlipidemia, pulmonary embolism, coronary artery disease, abdominal aortic aneurysm, interstitial lung disease  EXAM: PORTABLE CHEST - 1 VIEW  COMPARISON:  Portable exam 1440 hours compared to 08/01/2014  FINDINGS: Enlargement of cardiac silhouette with pulmonary vascular congestion.  Chronic interstitial changes diffusely which could represent CHF though underlying chronic interstitial disease not excluded.  Pleural thickening at the apices unchanged.  No segmental consolidation, pleural effusion or pneumothorax.  Bones demineralized.  IMPRESSION: Enlargement of cardiac silhouette with vascular congestion and question mild pulmonary edema though cannot exclude underlying chronic interstitial lung disease.   Electronically Signed   By: Ulyses Southward M.D.   On: 10/11/2014 14:55        Scheduled Meds: . allopurinol  300 mg Oral Daily  . antiseptic oral rinse  7 mL Mouth Rinse BID  . aspirin  81 mg Oral Daily  . atorvastatin  10 mg Oral Daily  . budesonide  0.5 mg Nebulization BID  . Chlorhexidine Gluconate Cloth  6 each Topical Q0600  . diltiazem  240 mg Oral Daily  . escitalopram  20 mg Oral Daily  . famotidine  20 mg Oral Daily  . ferrous sulfate  325 mg Oral BID WC  . furosemide  80 mg Intravenous 3 times per day  . gabapentin  300 mg Oral QHS  . insulin aspart  0-5 Units Subcutaneous QHS  . insulin aspart  0-9 Units Subcutaneous TID WC  . ipratropium-albuterol  3 mL Nebulization Q6H  . loratadine  10 mg Oral Daily  . methylPREDNISolone (SOLU-MEDROL) injection  40 mg Intravenous TID  . [START ON 10/13/2014] mupirocin ointment  1 application Nasal BID  . potassium  chloride SA  20 mEq Oral Daily  . Warfarin - Pharmacist Dosing Inpatient   Does not apply q1800   Continuous Infusions:   Active Problems:   Essential hypertension   Atrial fibrillation   CKD (chronic kidney disease) stage 3, GFR 30-59 ml/min   Acute on chronic diastolic heart failure   Acute on chronic respiratory failure with hypoxemia   ILD (interstitial lung disease)   Hypoxia    Time spent: 40 minutes.    Marcellus Scott, MD, FACP, FHM. [facility] Pager [phone]  If 7PM-7AM, please contact night-coverage [email or web address] Password [email or web address] 10/12/2014, 6:44 PM    LOS: 1 day

## 2014-10-12 NOTE — Progress Notes (Signed)
UR Completed Reyan Helle Graves-Bigelow, RN,BSN [phone]  

## 2014-10-12 NOTE — Progress Notes (Signed)
RN called RT due to low O2 sats. RT increased CPAP pressure to 20cmH2O and increased oxygen to 10Lpm. SpO2 now anywhere from 82-88%. RN notified.

## 2014-10-12 NOTE — Progress Notes (Signed)
At approximately 01:15, checked monitor for patient and noted O2 sat of 44%. Bedside alarms for O2 sat, respiratory rate and BP were turned off. Patient's fingers and mouth were blue on arrival to bedside. Patient responsive to voice commands and oriented to self and place, but not time or situation. Requested assistance from other RNs on the floor and immediately placed on non-rebreather mask at 100% O2. Rapid response team RN and respiratory therapist arrived to bedside shortly and provided close support. At approximately 10 minutes, patient's O2 sat returned to baseline (88 - 92%) with adjustments to CPAP expiratory pressure and O2 delivery.  Paged NP on-call for [facility], Maren ReamerKaren Kirby, to make aware of patient issues/status.  Filed Vitals:   10/12/14 0130  BP: 129/64  Pulse: 83  Temp:   Resp: 15  SPO2:    93% on 6 L/m via CPAP  Continuing to acutely monitor.

## 2014-10-12 NOTE — Telephone Encounter (Signed)
[facility] and was admitted for hypoxia.

## 2014-10-12 NOTE — Progress Notes (Signed)
    Subjective:  Worsening shortness of breath overnight. Feels a little better this morning. No chest pain.  Objective:  Vital Signs in the last 24 hours: Temp:  [97.3 F (36.3 C)-99.3 F (37.4 C)] 97.3 F (36.3 C) (06/01 0729) Pulse Rate:  [66-85] 71 (06/01 0730) Resp:  [15-30] 21 (06/01 0730) BP: (109-137)/(56-95) 110/83 mmHg (06/01 0730) SpO2:  [48 %-97 %] 96 % (06/01 0730) FiO2 (%):  [40 %-100 %] 100 % (06/01 0510) Weight:  [271 lb 6.4 oz (123.106 kg)] 271 lb 6.4 oz (123.106 kg) (05/31 1852)  Intake/Output from previous day: 05/31 0701 - 06/01 0700 In: -  Out: 2050 [Urine:2050]  Physical Exam: Pt is alert and oriented, very pleasant, on 100% nonrebreather, in mild respiratory distress  HEENT: normal Neck: JVP - unable to visualize Lungs: Decreased breath sounds bilaterally CV: RRR without murmur or gallop, distant heart sounds Abd: soft, NT, Positive BS, no hepatomegaly Ext: Diffuse edema  Lab Results:  Recent Labs  10/11/14 1431  WBC 14.1*  HGB 14.4  PLT 213    Recent Labs  10/11/14 1431  NA 141  K 4.3  CL 94*  CO2 34*  GLUCOSE 133*  BUN 35*  CREATININE 1.55*    Recent Labs  10/11/14 2036 10/12/14 0049  TROPONINI 0.04* 0.04*    Cardiac Studies: 2-D echocardiogram 08/04/2014: Study Conclusions  - Left ventricle: The cavity size was normal. Wall thickness was increased in a pattern of mild LVH. Systolic function was normal. The estimated ejection fraction was in the range of 55% to 60%. Wall motion was normal; there were no regional wall motion abnormalities. Doppler parameters are consistent with abnormal left ventricular relaxation (grade 1 diastolic dysfunction). - Mitral valve: Calcified annulus. There was mild regurgitation. - Left atrium: The atrium was moderately to severely dilated. - Tricuspid valve: There was mild-moderate regurgitation. - Pulmonary arteries: Systolic pressure was mildly to  moderately increased.  Tele: Personally reviewed, normal sinus rhythm without significant arrhythmia  Assessment/Plan:  1. Acute on chronic hypoxic respiratory failure 2. Acute on chronic diastolic heart failure 3. Paroxysmal atrial fibrillation, maintaining sinus rhythm 4. Acute on chronic kidney disease, baseline CKD3  Unfortunately the patient is clinically worse even with 2 L negative diuresis overnight. We will continue to diurese her aggressively. Will increase furosemide to 3 times daily. Have asked pulmonary/critical care medicine to evaluate her. Her respiratory status is very marginal and she is at high risk of requiring mechanical ventilation. Her overall prognosis appears to be quite poor.   Tonny Bollmanooper, Rasheida Broden, M.D. 10/12/2014, 8:51 AM

## 2014-10-13 ENCOUNTER — Inpatient Hospital Stay (HOSPITAL_COMMUNITY): Payer: Medicare Other

## 2014-10-13 DIAGNOSIS — Z7901 Long term (current) use of anticoagulants: Secondary | ICD-10-CM

## 2014-10-13 LAB — BASIC METABOLIC PANEL
ANION GAP: 14 (ref 5–15)
BUN: 28 mg/dL — AB (ref 6–20)
CHLORIDE: 95 mmol/L — AB (ref 101–111)
CO2: 33 mmol/L — ABNORMAL HIGH (ref 22–32)
CREATININE: 1.3 mg/dL — AB (ref 0.44–1.00)
Calcium: 8.7 mg/dL — ABNORMAL LOW (ref 8.9–10.3)
GFR calc Af Amer: 43 mL/min — ABNORMAL LOW (ref >60)
GFR, EST NON AFRICAN AMERICAN: 37 mL/min — AB (ref >60)
GLUCOSE: 149 mg/dL — AB (ref 65–99)
Potassium: 3.8 mmol/L (ref 3.5–5.1)
SODIUM: 142 mmol/L (ref 135–145)

## 2014-10-13 LAB — CBC
HCT: 44.7 % (ref 36.0–46.0)
Hemoglobin: 13.8 g/dL (ref 12.0–15.0)
MCH: 31.7 pg (ref 26.0–34.0)
MCHC: 30.9 g/dL (ref 30.0–36.0)
MCV: 102.8 fL — ABNORMAL HIGH (ref 78.0–100.0)
PLATELETS: 201 10*3/uL (ref 150–400)
RBC: 4.35 MIL/uL (ref 3.87–5.11)
RDW: 20.6 % — AB (ref 11.5–15.5)
WBC: 12.4 10*3/uL — ABNORMAL HIGH (ref 4.0–10.5)

## 2014-10-13 LAB — GLUCOSE, CAPILLARY
GLUCOSE-CAPILLARY: 105 mg/dL — AB (ref 65–99)
Glucose-Capillary: 146 mg/dL — ABNORMAL HIGH (ref 65–99)
Glucose-Capillary: 135 mg/dL — ABNORMAL HIGH (ref 65–99)
Glucose-Capillary: 167 mg/dL — ABNORMAL HIGH (ref 65–99)

## 2014-10-13 LAB — PROTIME-INR
INR: 2.31 — ABNORMAL HIGH (ref 0.00–1.49)
PROTHROMBIN TIME: 25.2 s — AB (ref 11.6–15.2)

## 2014-10-13 MED ORDER — ACETAMINOPHEN 325 MG PO TABS
650.0000 mg | ORAL_TABLET | Freq: Four times a day (QID) | ORAL | Status: DC | PRN
  Administered 2014-10-13 – 2014-10-19 (×7): 650 mg via ORAL
  Filled 2014-10-13 (×7): qty 2

## 2014-10-13 MED ORDER — POLYETHYLENE GLYCOL 3350 17 G PO PACK: 17.0000 g | PACK | Freq: Every day | ORAL | Status: DC

## 2014-10-13 MED ORDER — WARFARIN SODIUM 7.5 MG PO TABS
3.7500 mg | ORAL_TABLET | ORAL | Status: DC
  Administered 2014-10-13: 3.75 mg via ORAL
  Filled 2014-10-13: qty 2

## 2014-10-13 MED ORDER — POLYETHYLENE GLYCOL 3350 17 G PO PACK
17.0000 g | PACK | Freq: Once | ORAL | Status: DC
  Filled 2014-10-13: qty 1

## 2014-10-13 MED ORDER — WARFARIN SODIUM 2.5 MG PO TABS
2.5000 mg | ORAL_TABLET | ORAL | Status: DC
  Administered 2014-10-14: 2.5 mg via ORAL
  Filled 2014-10-13: qty 1

## 2014-10-13 NOTE — Progress Notes (Signed)
   10/13/14 1100  Clinical Encounter Type  Visited With Patient  Visit Type Initial;Spiritual support;Social support  Spiritual Encounters  Spiritual Needs Prayer;Emotional  [place] met with patient; 81 year old with oxygen support; patient was very talkative and welcoming; [place] discussed planning with patient who discussed her care plan.  Palliative care was discussed and would welcome visit.  [facility] will discuss with RN.

## 2014-10-13 NOTE — Consult Note (Signed)
Consultation Note Date: 10/13/2014   Patient Name: [name] [name]  DOB: 1931-12-01  MRN: [id number]  Age / Sex: 81 y.o., female   PCP: [name] Sanders, MD Referring Physician: Modena Jansky, MD  Reason for Consultation: Establishing goals of care  Palliative Care Assessment and Plan Summary of Established Goals of Care and Medical Treatment Preferences    Palliative Care Discussion Held Today Contacts/Participants in Discussion: Primary Decision Maker: Patient and then her son [name] [name]  HCPOA: yes   I met with [name] [name] today and she is in good spirits and conversing easily via nonrebreather. She did not appear to become short of breath with long conversation nor did her saturations decrease. We had a long conversation and she is aware that she is getting worse and says "I wish I had 5 years left but I don't think I do." She makes jokes and feels fairly comfortable discussing her death. She says she does not fear death - at least not right now. She continues to hope for improvement to titrate oxygen down to nasal cannula and return home. She has much support at home living with her son, has nurse coming in, and also has a hired helper that comes to her home and cooks/buys groceries/and cleans the house. She enjoys watching her helpers 2 little girls play at her home while their mother works. She also likes to read mysteries, knit, and crochet at home so she still has many options that bring her good QOL even though she cannot get out of the house. She also states that she has some adjustments to make to her Living Will and is currently cleaning out her home "so my children won't have to do it."   Code Status/Advance Care Planning:  FULL - She would not tracheostomy and says she would not go to a nursing home. She is considering if she would want resuscitation. I believe one factor influencing this decision is that she says her mother "only had a quarter of her heart working" and that she went  through resuscitation and went on to live another good 10 yrs until 81 yo.   Symptom Management:   Constipation: Miralax ordered. May need to have scheduled regimen and can add that tomorrow if Miralax ineffective.   Dyspnea: Much improved according to [name] [name]. She is hopeful to come down from nonrebreather today.   Psycho-social/Spiritual:   Support System: She has great support with numerous children/grandchildren living close by. She speaks highly of her granddaughter who she raised as her own and just graduated veterinary school. She is happy with the support she has at home.   Desire for further Chaplaincy support:yes  Prognosis: Likely < 6 months  Discharge Planning:  To be decided - she wants to return home        Chief Complaint/History of Present Illness: 81 yo female with multiple medical problems including Afib, HTN, dCHF, CKD, chronic respiratory failure r/t COPD, OSA/OHS and ILD (on chronic prednisone) on 5L home O2. She presented 5/31 with 1 week hx increased SOB, weight gain and BLE edema. Admitted by [facility] and seen by cardiology for acute on chronic diastolic CHF. Diuresed gently but continued to worsen requiring NRB and BiPAP. PMH reviewed.   Primary Diagnoses  Present on Admission:  . Acute on chronic diastolic heart failure . Atrial fibrillation . Essential hypertension . CKD (chronic kidney disease) stage 3, GFR 30-59 ml/min . ILD (interstitial lung disease) . Acute on chronic respiratory failure with hypoxemia .  Hypoxia  Palliative Review of Systems: + constipation  I have reviewed the medical record, interviewed the patient and family, and examined the patient. The following aspects are pertinent.  Past Medical History  Diagnosis Date  . Congestive heart failure, unspecified     a. ECHO 6/0: EF 55-60%, mild LVH, mildly dilated RV w. mildly dec fx, RVSP 67  . Chronic atrial fibrillation     failed cardioversion in past  -repeat DC-CV on October  5th, 2010  . Personal history of DVT (deep vein thrombosis) 2008    LLE  . HTN (hypertension)   . Morbid obesity   . Shingles     with postherpetic neuraigia  . GERD (gastroesophageal reflux disease)   . Hyperlipidemia   . Meningitis ~ 1960    hospitalized @ [facility]  . Gallstones     s/p cholecystectomy  . CAD (coronary artery disease)     nonobstructive by cath 8/10 - LAD 40-50% w mild PAH mean 29 w PVR 3.2 [place]  . PE (pulmonary embolism) ~ 2010    LLE  . Shortness of breath dyspnea   . Lung interstitial disease   . Hypoxemia   . COPD (chronic obstructive pulmonary disease)   . On home oxygen therapy     "5L; 24/7" (10/11/2014)  . OSA treated with BiPAP     "I think it's BiPAP"  . AAA (abdominal aortic aneurysm)     small  . History of blood transfusion   . Anemia   . Iron deficiency anemia   . History of blood transfusion     "related to nose bleed"  . Gout   . Depression   . Chronic kidney disease (CKD), stage II (mild)    History   Social History  . Marital Status: Widowed    Spouse Name: N/A  . Number of Children: N/A  . Years of Education: N/A   Social History Main Topics  . Smoking status: Never Smoker   . Smokeless tobacco: Never Used  . Alcohol Use: Yes     Comment: 10/11/2014 "used to have a drink on holidays; don't drink at all now"  . Drug Use: No  . Sexual Activity: No   Other Topics Concern  . None   Social History Narrative   Widowed. Retired from [employer], occupational.   Family History  Problem Relation Age of Onset  . Heart failure Mother   . Depression Mother   . Ovarian cancer Paternal Grandmother   . Heart disease Paternal Grandfather   . Obesity Other    Scheduled Meds: . allopurinol  300 mg Oral Daily  . antiseptic oral rinse  7 mL Mouth Rinse BID  . aspirin  81 mg Oral Daily  . atorvastatin  10 mg Oral Daily  . budesonide  0.5 mg Nebulization BID  . Chlorhexidine Gluconate Cloth  6 each Topical Q0600  . diltiazem  240 mg Oral  Daily  . escitalopram  20 mg Oral Daily  . famotidine  20 mg Oral Daily  . ferrous sulfate  325 mg Oral BID WC  . furosemide  80 mg Intravenous 3 times per day  . gabapentin  300 mg Oral QHS  . insulin aspart  0-5 Units Subcutaneous QHS  . insulin aspart  0-9 Units Subcutaneous TID WC  . ipratropium-albuterol  3 mL Nebulization Q6H  . loratadine  10 mg Oral Daily  . methylPREDNISolone (SOLU-MEDROL) injection  40 mg Intravenous TID  . mupirocin ointment  1 application Nasal BID  . potassium chloride SA  20 mEq Oral Daily  . Warfarin - Pharmacist Dosing Inpatient   Does not apply q1800   Continuous Infusions:  PRN Meds:.albuterol, colchicine Medications Prior to Admission:  Prior to Admission medications   Medication Sig Start Date End Date Taking? Authorizing Provider  allopurinol (ZYLOPRIM) 300 MG tablet Take 1 tablet (300 mg total) by mouth daily. 06/08/14  Yes Amy Michelle Nasuti, MD  aspirin 81 MG tablet Take 81 mg by mouth daily.   Yes Historical Provider, MD  atorvastatin (LIPITOR) 10 MG tablet Take 1 tablet (10 mg total) by mouth daily. 07/18/14  Yes Amy E Bedsole, MD  budesonide (PULMICORT) 0.25 MG/2ML nebulizer solution Take 0.25 mg by nebulization 2 (two) times daily.   Yes Historical Provider, MD  cetirizine (ZYRTEC ALLERGY) 10 MG tablet Take 10 mg by mouth as needed for allergies.    Yes Historical Provider, MD  colchicine 0.6 MG tablet Take 0.6 mg by mouth as needed (for flareup).  01/11/14  Yes Historical Provider, MD  diltiazem (CARDIZEM CD) 240 MG 24 hr capsule Take 1 capsule (240 mg total) by mouth daily. 08/07/14  Yes Renae Fickle, MD  escitalopram (LEXAPRO) 20 MG tablet Take 1 tablet (20 mg total) by mouth daily. 05/02/14  Yes Amy Michelle Nasuti, MD  famotidine (PEPCID) 20 MG tablet Take 1 tablet (20 mg total) by mouth daily. 04/15/14  Yes Antonieta Iba, MD  ferrous sulfate 325 (65 FE) MG tablet Take 1 tablet (325 mg total) by mouth 2 (two) times daily with a meal. 08/07/14  Yes  Renae Fickle, MD  gabapentin (NEURONTIN) 300 MG capsule Take 1 capsule (300 mg total) by mouth daily. 08/18/14  Yes Amy Michelle Nasuti, MD  OVER THE COUNTER MEDICATION Inhale 1 application into the lungs at bedtime. USES BIPAP EVERY BEDTIME FOR SLEEP   Yes Historical Provider, MD  potassium chloride SA (K-DUR,KLOR-CON) 20 MEQ tablet Take 20 mEq by mouth daily.   Yes Historical Provider, MD  predniSONE (DELTASONE) 5 MG tablet Take 20 mg by mouth daily.  09/09/14  Yes Historical Provider, MD  torsemide (DEMADEX) 20 MG tablet Take 3 tablets (60 mg total) by mouth 2 (two) times daily. Patient taking differently: Take 20 mg by mouth 3 (three) times daily.  08/19/14  Yes Antonieta Iba, MD  warfarin (COUMADIN) 2.5 MG tablet Take as directed. 10/04/14  Yes Amy Michelle Nasuti, MD  metolazone (ZAROXOLYN) 5 MG tablet Take 1 tablet (5 mg total) by mouth daily as needed. 08/19/14   Antonieta Iba, MD   No Known Allergies CBC:    Component Value Date/Time   WBC 12.4* 10/13/2014 0415   WBC 18.3* 06/18/2013 0443   HGB 13.8 10/13/2014 0415   HGB 9.1* 06/18/2013 1320   HCT 44.7 10/13/2014 0415   HCT 38.2 08/04/2014 0420   HCT 26.6* 06/18/2013 0443   PLT 201 10/13/2014 0415   PLT 324 06/18/2013 0443   MCV 102.8* 10/13/2014 0415   MCV 90 06/18/2013 0443   NEUTROABS 6.7 03/17/2014 0620   NEUTROABS 14.5* 06/18/2013 0443   LYMPHSABS 0.7 03/17/2014 0620   LYMPHSABS 1.7 06/18/2013 0443   MONOABS 0.7 03/17/2014 0620   MONOABS 1.6* 06/18/2013 0443   EOSABS 0.4 03/17/2014 0620   EOSABS 0.3 06/18/2013 0443   BASOSABS 0.0 03/17/2014 0620   BASOSABS 0.1 06/18/2013 0443   Comprehensive Metabolic Panel:    Component Value Date/Time   NA 142 10/13/2014 0415  NA 140 10/04/2014 1618   NA 136 06/18/2013 0443   K 3.8 10/13/2014 0415   K 3.2* 01/24/2014 1608   CL 95* 10/13/2014 0415   CL 95* 06/18/2013 0443   CO2 33* 10/13/2014 0415   CO2 38* 06/18/2013 0443   BUN 28* 10/13/2014 0415   BUN 61* 10/04/2014 1618    BUN 77* 06/18/2013 0443   CREATININE 1.30* 10/13/2014 0415   CREATININE 1.65* 09/17/2013 1646   CREATININE 1.79* 06/18/2013 0443   GLUCOSE 149* 10/13/2014 0415   GLUCOSE 159* 10/04/2014 1618   GLUCOSE 117* 06/18/2013 0443   CALCIUM 8.7* 10/13/2014 0415   CALCIUM 9.6 06/18/2013 0443   AST 21 03/03/2014 0820   AST 34 06/16/2013 1334   ALT 21 03/03/2014 0820   ALT 35 06/16/2013 1334   ALKPHOS 79 03/03/2014 0820   ALKPHOS 103 06/16/2013 1334   BILITOT 0.4 03/03/2014 0820   PROT 7.5 03/03/2014 0820   PROT 8.1 06/16/2013 1334   ALBUMIN 3.0* 03/03/2014 0820   ALBUMIN 3.2* 06/16/2013 1334    Physical Exam: Vital Signs: BP 123/66 mmHg  Pulse 58  Temp(Src) 98 F (36.7 C) (Axillary)  Resp 20  Ht $R'5\' 4"'EA$  (1.626 m)  Wt 136.306 kg (300 lb 8 oz)  BMI 51.56 kg/m2  SpO2 98% SpO2: SpO2: 98 % O2 Device: O2 Device: NRB O2 Flow Rate: O2 Flow Rate (L/min): 15 L/min Intake/output summary:  Intake/Output Summary (Last 24 hours) at 10/13/14 0913 Last data filed at 10/13/14 0527  Gross per 24 hour  Intake      0 ml  Output   3175 ml  Net  -3175 ml   LBM:  5/31 Baseline Weight: Weight: 123.106 kg (271 lb 6.4 oz) Most recent weight: Weight: (!) 136.306 kg (300 lb 8 oz)  Exam Findings:  Gen: NAD, sitting up in bed HEENT: [place]/AT, moist mucous membranes CV: RRR Pulm: No labored breathing, speaking fluently in conversation with ease, nonrebreather in place Abd: Soft, NT, ND Extremities: MAE Neuro: Awake, alert, oriented x 3         Palliative Performance Scale: 30%               Additional Data Reviewed: Recent Labs     10/11/14  1431  10/12/14  0802  10/13/14  0415  WBC  14.1*   --   12.4*  HGB  14.4   --   13.8  PLT  213   --   201  NA  141  142  142  BUN  35*  31*  28*  CREATININE  1.55*  1.48*  1.30*     Time In: 0900 Time Out: 1010 Time Total: 15min  Greater than 50%  of this time was spent counseling and coordinating care related to the above assessment and  plan.  Signed by: Pershing Proud, NP  Pershing Proud, NP  3/0/0762, 9:13 AM  Please contact Palliative Medicine Team phone at [phone] for questions and concerns.

## 2014-10-13 NOTE — Care Management Note (Signed)
Case Management Note  Patient Details  Name: [name] [name] MRN: [id number] Date of Birth: 11/27/1931  Subjective/Objective: Pt admitted for worsening SOB. [facility]                   Action/Plan: Pt will need resumption orders for home if this continues to be the plan of care. CM will continue to monitor.    Expected Discharge Date:                  Expected Discharge Plan:  Home w Home Health Services  In-House Referral:     Discharge planning Services  CM Consult  Post Acute Care Choice:  Home Health, Resumption of Svcs/PTA Provider Choice offered to:  Patient  DME Arranged:    DME Agency:     HH Arranged:  RN HH Agency:  [facility]  Status of Service:  In process, will continue to follow  Medicare Important Message Given:    Date Medicare IM Given:    Medicare IM give by:    Date Additional Medicare IM Given:    Additional Medicare Important Message give by:     If discussed at Long Length of Stay Meetings, dates discussed:    Additional Comments:  Gala LewandowskyGraves-Bigelow, Lonnel Gjerde Kaye, RN 10/13/2014, 2:57 PM

## 2014-10-13 NOTE — Discharge Instructions (Addendum)

## 2014-10-13 NOTE — Progress Notes (Signed)
PULMONARY / CRITICAL CARE MEDICINE   Name: [name] [name] T Suit MRN: [id number] DOB: 06/14/1931    ADMISSION DATE:  10/11/2014 CONSULTATION DATE:  10/12/14  REFERRING MD :  Waymon AmatoHongalgi   CHIEF COMPLAINT:  Respiratory failure   INITIAL PRESENTATION:   81yo female with multiple medical problems including Afib, HTN, dCHF, CKD, chronic respiratory failure r/t COPD, OSA/OHS and ILD (on chronic prednisone) on 5L home O2.  She presented 5/31 with 1 week hx increased SOB, weight gain and BLE edema.  Admitted by [facility] and seen by cardiology for acute on chronic diastolic CHF.  Diuresed gently but overnight continued to worsen requiring NRB and PCCM consulted.   STUDIES:    SIGNIFICANT EVENTS:    SUBJECTIVE:  Feels better  VITAL SIGNS: Temp:  [97.8 F (36.6 C)-98.2 F (36.8 C)] 98 F (36.7 C) (06/02 0740) Pulse Rate:  [58-82] 58 (06/02 0500) Resp:  [20-27] 20 (06/02 0500) BP: (118-133)/(65-68) 123/66 mmHg (06/02 0500) SpO2:  [89 %-98 %] 98 % (06/02 0829) FiO2 (%):  [50 %-100 %] 100 % (06/02 0829) Weight:  [136.306 kg (300 lb 8 oz)] 136.306 kg (300 lb 8 oz) (06/02 0500) INTAKE / OUTPUT:  Intake/Output Summary (Last 24 hours) at 10/13/14 1014 Last data filed at 10/13/14 0527  Gross per 24 hour  Intake      0 ml  Output   3175 ml  Net  -3175 ml    PHYSICAL EXAMINATION: General:  Pleasant, obese female, NAD, on partial re-breather mask  Neuro:  Awake, alert, appropriate, MAE, gen weakness  HEENT:  Mm moist, NRB  Cardiovascular:  s1s2 rrr, distant  Lungs:  resps even, mildly tachypneic on NRB,faint scattered wheeze, decreased bases  Abdomen:  Obese, soft, non tender  Musculoskeletal:  Warm and dry, 2-3+ BLE edema, venous stasis   LABS:  CBC  Recent Labs Lab 10/11/14 1431 10/13/14 0415  WBC 14.1* 12.4*  HGB 14.4 13.8  HCT 45.5 44.7  PLT 213 201   Coag's  Recent Labs Lab 10/11/14 1431 10/12/14 0802 10/13/14 0415  INR 2.30* 2.02* 2.31*   BMET  Recent Labs Lab  10/11/14 1431 10/12/14 0802 10/13/14 0415  NA 141 142 142  K 4.3 3.2* 3.8  CL 94* 94* 95*  CO2 34* 36* 33*  BUN 35* 31* 28*  CREATININE 1.55* 1.48* 1.30*  GLUCOSE 133* 96 149*   Electrolytes  Recent Labs Lab 10/11/14 1431 10/12/14 0802 10/13/14 0415  CALCIUM 9.1 8.8* 8.7*   Sepsis Markers No results for input(s): LATICACIDVEN, PROCALCITON, O2SATVEN in the last 168 hours. ABG  Recent Labs Lab 10/12/14 0222  PHART 7.446  PCO2ART 53.1*  PO2ART 60.6*   Liver Enzymes No results for input(s): AST, ALT, ALKPHOS, BILITOT, ALBUMIN in the last 168 hours. Cardiac Enzymes  Recent Labs Lab 10/11/14 2036 10/12/14 0049 10/12/14 0802  TROPONINI 0.04* 0.04* 0.05*   Glucose  Recent Labs Lab 10/11/14 2055 10/12/14 0730 10/12/14 1126 10/12/14 1647 10/12/14 2117 10/13/14 0741  GLUCAP 174* 95 119* 126* 152* 146*    Imaging Dg Chest Portable 1 View  10/13/2014   CLINICAL DATA:  81 year old female with a history of pulmonary edema  EXAM: PORTABLE CHEST - 1 VIEW  COMPARISON:  Multiple prior plain film, most recent 10/12/2014. CT 08/03/2014  FINDINGS: Cardiomediastinal silhouette is unchanged with cardiomegaly. Borders are partially obscured by overlying lung and pleural disease.  Lung volumes remain low with interstitial and airspace opacities. Hemidiaphragms are obscured.  Unremarkable appearance of the visualized skeletal structures.  IMPRESSION: Low lung volumes with worsening edema and likely small bilateral pleural effusions.  Signed,  Yvone Neu. Loreta Ave, DO  [facility]  [facility]   Electronically Signed   By: Gilmer Mor D.O.   On: 10/13/2014 07:51     ASSESSMENT / PLAN:  Acute on chronic respiratory failure in setting of Pulmonary edema superimposed on underlying ILD and obesity hypoventilation..  no surgical bx r/t multiple medical problems/ CHF but thought likely IPF. Trace pos ANA and SSA but rapid worsening of symptoms  since January despite prednisone.  >favor edema d/t decomp diastolic HF, OHS/OSA and volume overload  being the larger issue here given she is - 3 liters and now feels better and we are able to wean O2 support.   Plan:    Supplemental O2 as needed Bipap @ HS F/u CXR  Continue budesonide, change to 0.5mg  BID  Continue duonebs - change to q6h  Cont lasix as long as BUN/creatinine and BP tolerate  Continue PRN albuterol  Continue solumedrol -(baseline 20-30mg  daily pred), will begin taper 6/3   Acute on chronic diastolic CHF  A-Fib  CKD III - baseline Scr ~1.3 Chronic anticoagulation r/t AFib  DM  Plan Per cards/IM service   Simonne Martinet ACNP-BC [facility] Pulmonary/Critical Care Pager # [phone] OR # [phone] if no answer  PCCM ATTENDING: I have reviewed pt's initial presentation, consultants notes and hospital database in detail.  The above assessment and plan was formulated under my direction.  She feels somewhat better today but still is on high flow O2 She has required intermittent BiPAP Her exam continues to reveal bilateral crackles and moderate symmetric LE edema CXR is unchanged with bilateral infiltrates and CM  In summary: Acute on chronic respiratory failure - hypoxic History of ILD Diastolic dysfunction Increased pulmonary infiltrates - edema and/or pneumonitis  There is little further tho offer beyond diuresis and systemic steroids which will be continued as currently ordered. She would not likely have a favorable outcome from intubation and certainly would not recover well if she were to undergo CPR/ACLS. Therefore, Palliative Care consultation is appropriate.  Billy Fischer, MD;  PCCM service; Mobile [phone]

## 2014-10-13 NOTE — Progress Notes (Addendum)
PROGRESS NOTE    [name] [name] HUT:[id number] DOB: 1932-03-05 DOA: 10/11/2014 PCP: Eliezer Lofts, MD  HPI/Brief narrative 81 year old female with history of interstitial lung disease, A. fib, HTN, CKD, chronic diastolic CHF, morbid obesity, chronic respiratory failure on oxygen 5 L/m at home, lives with son and ambulates with the help of a walker, admitted on 10/11/14 with worsening dyspnea. She had recently seen her cardiologist who adjusted down her diuretic dose due to renal insufficiency. She has since gained 4 pounds of weight. She was recently seen by pulmonology for second opinion of her ILD and hospice was recommended which she declined. Prednisone was also in increased. In the ED, BNP elevated, chest x-ray showed cardiomegaly with vascular congestion and possible pulmonary edema. Cardiology was consulted and increased her Lasix dose. On 6/1, she became acutely dyspneic but improved after nebulizations. Pulmonary consulted.   Assessment/Plan:  Acute on chronic hypoxic respiratory failure - Most likely secondary to decompensated CHF complicating underlying COPD and IPF - Treating CHF and COPD aggressively. - Was on high flow oxygen this morning. Titrate oxygen down to maintain saturations between 89-92 percent. - Palliative care met with patient today: Patient wishes to continue full code - Pulmonary follow-up appreciated  Acute on chronic diastolic CHF - Patient started on high-dose IV Lasix with good diuresis - -5 L since admission. - Cardiology follow-up appreciated - Improving. Continue IV Lasix.  Paroxysmal A. Fib - Sinus rhythm on monitor. Continue diltiazem and Coumadin per pharmacy  Acute on stage III chronic kidney disease  - Monitor creatinine closely while on diuretics  - Creatinine has improved.  Hypokalemia - Replace and follow as needed  Failure to thrive - Multifactorial due to complex multiple medical conditions. Discussed at length with her. She wishes to  remain full code but is agreeable to palliative care consultation for goals of care.  Hyperglycemia - Related to steroids. Continue SSI - Check A1c  GERD  - On H2 blocker   HLD - Continue statins  History of PE - Anticoagulated  DVT prophylaxis: Anticoagulated on Coumadin.  Code Status: Full Family Communication: None at bedside Disposition Plan: DC home when medically stable.   Consultants:  Cardiology  PCCM  Palliative care medicine  Procedures:  BIPAP PRN  Antibiotics:  None   Subjective: Breathing better.   Objective: Filed Vitals:   10/13/14 0814 10/13/14 0829 10/13/14 1027 10/13/14 1153  BP:      Pulse:      Temp:    98.6 F (37 C)  TempSrc:    Oral  Resp:      Height:      Weight:      SpO2: 91% 98% 95%    pulse 58/m, respiration 20 per minute, blood pressure 123/66  Intake/Output Summary (Last 24 hours) at 10/13/14 1539 Last data filed at 10/13/14 0527  Gross per 24 hour  Intake      0 ml  Output   2075 ml  Net  -2075 ml   Filed Weights   10/11/14 1852 10/13/14 0500  Weight: 123.106 kg (271 lb 6.4 oz) 136.306 kg (300 lb 8 oz)     Exam:  General exam: Morbidly obese female lying propped up in bedon oxygen via Ventimask. No respiratory distress. Looks much improved compared to yesterday.  Respiratory system: improved breath sounds. No wheezing or rhonchi. Few basal crackles. No increased work of breathing. Cardiovascular system: S1 & S2 heard, RRR. No JVD, murmurs, gallops, clicks. 1+ pitting bilateral leg edema.  Telemetry: Sinus rhythm  Gastrointestinal system: Abdomen is nondistended, soft and nontender. Normal bowel sounds heard. Central nervous system: Alert and oriented. No focal neurological deficits. Extremities: Symmetric 5 x 5 power.   Data Reviewed: Basic Metabolic Panel:  Recent Labs Lab 10/11/14 1431 10/12/14 0802 10/13/14 0415  NA 141 142 142  K 4.3 3.2* 3.8  CL 94* 94* 95*  CO2 34* 36* 33*  GLUCOSE 133* 96  149*  BUN 35* 31* 28*  CREATININE 1.55* 1.48* 1.30*  CALCIUM 9.1 8.8* 8.7*   Liver Function Tests: No results for input(s): AST, ALT, ALKPHOS, BILITOT, PROT, ALBUMIN in the last 168 hours. No results for input(s): LIPASE, AMYLASE in the last 168 hours. No results for input(s): AMMONIA in the last 168 hours. CBC:  Recent Labs Lab 10/11/14 1431 10/13/14 0415  WBC 14.1* 12.4*  HGB 14.4 13.8  HCT 45.5 44.7  MCV 100.9* 102.8*  PLT 213 201   Cardiac Enzymes:  Recent Labs Lab 10/11/14 2036 10/12/14 0049 10/12/14 0802  TROPONINI 0.04* 0.04* 0.05*   BNP (last 3 results)  Recent Labs  02/22/14 0947 02/24/14 0332 03/07/14 0648  PROBNP 2319.0* 2410.0* 437.5   CBG:  Recent Labs Lab 10/12/14 1126 10/12/14 1647 10/12/14 2117 10/13/14 0741 10/13/14 1149  GLUCAP 119* 126* 152* 146* 105*    Recent Results (from the past 240 hour(s))  MRSA PCR Screening     Status: Abnormal   Collection Time: 10/11/14  6:49 PM  Result Value Ref Range Status   MRSA by PCR POSITIVE (A) NEGATIVE Final    Comment:        The GeneXpert MRSA Assay (FDA approved for NASAL specimens only), is one component of a comprehensive MRSA colonization surveillance program. It is not intended to diagnose MRSA infection nor to guide or monitor treatment for MRSA infections. RESULT CALLED TO, READ BACK BY AND VERIFIED WITH: RALPH,C RN 0031 10/12/14 MITCHELL,L          Studies: Dg Chest Portable 1 View  10/13/2014   CLINICAL DATA:  81 year old female with a history of pulmonary edema  EXAM: PORTABLE CHEST - 1 VIEW  COMPARISON:  Multiple prior plain film, most recent 10/12/2014. CT 08/03/2014  FINDINGS: Cardiomediastinal silhouette is unchanged with cardiomegaly. Borders are partially obscured by overlying lung and pleural disease.  Lung volumes remain low with interstitial and airspace opacities. Hemidiaphragms are obscured.  Unremarkable appearance of the visualized skeletal structures.   IMPRESSION: Low lung volumes with worsening edema and likely small bilateral pleural effusions.  Signed,  Dulcy Fanny. Earleen Newport, DO  [facility]  [facility]   Electronically Signed   By: Corrie Mckusick D.O.   On: 10/13/2014 07:51   Dg Chest Port 1 View  10/12/2014   CLINICAL DATA:  Worsening shortness of breath and decreased O2 saturation. Personal history of pulmonary edema. Initial encounter.  EXAM: PORTABLE CHEST - 1 VIEW  COMPARISON:  Chest radiograph performed 10/11/2014  FINDINGS: The right costophrenic angle is incompletely imaged on this study. Small bilateral pleural effusions are seen. Underlying vascular congestion and diffusely increased interstitial markings are compatible with pulmonary edema, similar in appearance to the prior study. No pneumothorax is seen.  The cardiomediastinal silhouette is mildly enlarged. No acute osseous abnormalities are identified.  IMPRESSION: Small bilateral pleural effusions noted. Underlying vascular congestion and mild cardiomegaly, with diffusely increased interstitial markings, compatible with mild persistent pulmonary edema.   Electronically Signed   By: Garald Balding M.D.   On:  10/12/2014 03:33        Scheduled Meds: . allopurinol  300 mg Oral Daily  . antiseptic oral rinse  7 mL Mouth Rinse BID  . aspirin  81 mg Oral Daily  . atorvastatin  10 mg Oral Daily  . budesonide  0.5 mg Nebulization BID  . Chlorhexidine Gluconate Cloth  6 each Topical Q0600  . diltiazem  240 mg Oral Daily  . escitalopram  20 mg Oral Daily  . famotidine  20 mg Oral Daily  . ferrous sulfate  325 mg Oral BID WC  . furosemide  80 mg Intravenous 3 times per day  . gabapentin  300 mg Oral QHS  . insulin aspart  0-5 Units Subcutaneous QHS  . insulin aspart  0-9 Units Subcutaneous TID WC  . ipratropium-albuterol  3 mL Nebulization Q6H  . loratadine  10 mg Oral Daily  . methylPREDNISolone (SOLU-MEDROL) injection  40 mg Intravenous  TID  . mupirocin ointment  1 application Nasal BID  . polyethylene glycol  17 g Oral Once  . potassium chloride SA  20 mEq Oral Daily  . [START ON 10/14/2014] warfarin  2.5 mg Oral Q M,W,F,Sa-1800  . warfarin  3.75 mg Oral Once per day on Sun Tue Thu  . Warfarin - Pharmacist Dosing Inpatient   Does not apply q1800   Continuous Infusions:   Principal Problem:   Acute on chronic respiratory failure with hypoxemia Active Problems:   OBESITY-MORBID (>100')   Essential hypertension   PAF (paroxysmal atrial fibrillation)   Obstructive sleep apnea   Chronic hypoxemic respiratory failure   CKD (chronic kidney disease) stage 3, GFR 30-59 ml/min   Acute on chronic diastolic heart failure   Hx pulmonary embolism   ILD (interstitial lung disease)   History of DVT (deep vein thrombosis)   Hypoxia   Chronic anticoagulation    Time spent: 30 minutes.    Vernell Leep, MD, FACP, FHM. [facility] Pager [phone]  If 7PM-7AM, please contact night-coverage [email or web address] Password [email or web address] 10/13/2014, 3:39 PM    LOS: 2 days

## 2014-10-13 NOTE — Progress Notes (Signed)
Subjective:  She is breathing better today.  Objective:  Vital Signs in the last 24 hours: Temp:  [97.8 F (36.6 C)-98.2 F (36.8 C)] 98 F (36.7 C) (06/02 0740) Pulse Rate:  [58-82] 58 (06/02 0500) Resp:  [20-27] 20 (06/02 0500) BP: (118-133)/(65-68) 123/66 mmHg (06/02 0500) SpO2:  [89 %-98 %] 98 % (06/02 0829) FiO2 (%):  [50 %-100 %] 100 % (06/02 0829) Weight:  [300 lb 8 oz (136.306 kg)] 300 lb 8 oz (136.306 kg) (06/02 0500)  Intake/Output from previous day:  Intake/Output Summary (Last 24 hours) at 10/13/14 1016 Last data filed at 10/13/14 0527  Gross per 24 hour  Intake      0 ml  Output   3175 ml  Net  -3175 ml    Physical Exam: General appearance: alert, cooperative and morbidly obese Lungs: breath sounds bilat Heart: regular rate and rhythm   Rate: 60  Rhythm: normal sinus rhythm  Lab Results:  Recent Labs  10/11/14 1431 10/13/14 0415  WBC 14.1* 12.4*  HGB 14.4 13.8  PLT 213 201    Recent Labs  10/12/14 0802 10/13/14 0415  NA 142 142  K 3.2* 3.8  CL 94* 95*  CO2 36* 33*  GLUCOSE 96 149*  BUN 31* 28*  CREATININE 1.48* 1.30*    Recent Labs  10/12/14 0049 10/12/14 0802  TROPONINI 0.04* 0.05*    Recent Labs  10/13/14 0415  INR 2.31*    Scheduled Meds: . allopurinol  300 mg Oral Daily  . antiseptic oral rinse  7 mL Mouth Rinse BID  . aspirin  81 mg Oral Daily  . atorvastatin  10 mg Oral Daily  . budesonide  0.5 mg Nebulization BID  . Chlorhexidine Gluconate Cloth  6 each Topical Q0600  . diltiazem  240 mg Oral Daily  . escitalopram  20 mg Oral Daily  . famotidine  20 mg Oral Daily  . ferrous sulfate  325 mg Oral BID WC  . furosemide  80 mg Intravenous 3 times per day  . gabapentin  300 mg Oral QHS  . insulin aspart  0-5 Units Subcutaneous QHS  . insulin aspart  0-9 Units Subcutaneous TID WC  . ipratropium-albuterol  3 mL Nebulization Q6H  . loratadine  10 mg Oral Daily  . methylPREDNISolone (SOLU-MEDROL) injection  40 mg  Intravenous TID  . mupirocin ointment  1 application Nasal BID  . potassium chloride SA  20 mEq Oral Daily  . [START ON 10/14/2014] warfarin  2.5 mg Oral Q M,W,F,Sa-1800  . warfarin  3.75 mg Oral Once per day on Sun Tue Thu  . Warfarin - Pharmacist Dosing Inpatient   Does not apply q1800   Continuous Infusions:  PRN Meds:.albuterol, colchicine   Imaging: Dg Chest Portable 1 View  10/13/2014   CLINICAL DATA:  81 year old female with a history of pulmonary edema  EXAM: PORTABLE CHEST - 1 VIEW  COMPARISON:  Multiple prior plain film, most recent 10/12/2014. CT 08/03/2014  FINDINGS: Cardiomediastinal silhouette is unchanged with cardiomegaly. Borders are partially obscured by overlying lung and pleural disease.  Lung volumes remain low with interstitial and airspace opacities. Hemidiaphragms are obscured.  Unremarkable appearance of the visualized skeletal structures.  IMPRESSION: Low lung volumes with worsening edema and likely small bilateral pleural effusions.  Signed,  Yvone Neu. Loreta Ave, DO  [facility]  [facility]   Electronically Signed   By: Gilmer Mor D.O.   On: 10/13/2014 07:51  Cardiac Studies: Echo 08/04/14 Study Conclusions  - Left ventricle: The cavity size was normal. Wall thickness was increased in a pattern of mild LVH. Systolic function was normal. The estimated ejection fraction was in the range of 55% to 60%. Wall motion was normal; there were no regional wall motion abnormalities. Doppler parameters are consistent with abnormal left ventricular relaxation (grade 1 diastolic dysfunction). - Mitral valve: Calcified annulus. There was mild regurgitation. - Left atrium: The atrium was moderately to severely dilated. - Tricuspid valve: There was mild-moderate regurgitation. - Pulmonary arteries: Systolic pressure was mildly to moderately increased.  Assessment/Plan: 81 y/o morbidly obese female, followed by Dr.  Mariah MillingGollan. Her PMH is significant for chronic diastolic HF, chronic respiratory failure secondary to interstitial lung disease/pulmonary fibrosis, sleep apnea, and obesity hypoventilation syndrome. She is followed by Dr. [name]. She is on chronic home O2 (5%L at baseline), C-pap QHS, and chronic prednisone. She also has a h/o PAF, previously on flecainide, h/o DVT and PE in 2007 (now on chronic warfarin), h/o nonobstructive CAD by cath 12/2008, small AAA. Her most recent 2D echo was 07/2014 which demonstrated normal LV systolic function with an EF of 55-60% with G1DD. Also with mild MR and moderate-severely dilated LA.She was admitted 10/11/14 with acute on chronic respiratory failure.    Principal Problem:   Acute on chronic respiratory failure with hypoxemia Active Problems:   Acute on chronic diastolic heart failure   OBESITY-MORBID (>100')   Obstructive sleep apnea   Chronic hypoxemic respiratory failure   CKD (chronic kidney disease) stage 3, GFR 30-59 ml/min   ILD (interstitial lung disease)   Hypoxia   Chronic anticoagulation   Essential hypertension   PAF (paroxysmal atrial fibrillation)   Hx pulmonary embolism   History of DVT (deep vein thrombosis)   PLAN: Wgt not reliable, I/O negative 5.2L. Continue diuresis  Corine ShelterLuke Kilroy PA-C 10/13/2014, 10:16 AM [phone]  Patient seen, examined. Available data reviewed. Agree with findings, assessment, and plan as outlined by Corine ShelterLuke Kilroy, PA-C. The patient is clinically improved with IV diuresis. The fact that her creatinine is improving is a good sign. Her O2 requirements remain high. She is 5 L negative. I would recommend continuing IV diuresis to try to dry her out as much as possible. I have reviewed the notes from pulmonary/critical care medicine and we really don't have much else to offer her beyond diuresis and treatment of her congestive heart failure. She continues on systemic sterile for pulmonary fibrosis.  Tonny BollmanMichael Solaris Kram,  M.D. 10/13/2014 11:49 AM

## 2014-10-13 NOTE — Progress Notes (Signed)
ANTICOAGULATION CONSULT NOTE - Follow Up Consult  Pharmacy Consult for Coumadin Indication: atrial fibrillation, h/o DVT/PE  No Known Allergies  Patient Measurements: Height: 5\' 4"  (162.6 cm) Weight: (!) 300 lb 8 oz (136.306 kg) IBW/kg (Calculated) : 54.7 Heparin Dosing Weight:   Vital Signs: Temp: 98 F (36.7 C) (06/02 0740) Temp Source: Axillary (06/02 0740) BP: 123/66 mmHg (06/02 0500) Pulse Rate: 58 (06/02 0500)  Labs:  Recent Labs  10/11/14 1431 10/11/14 2036 10/12/14 0049 10/12/14 0802 10/13/14 0415  HGB 14.4  --   --   --  13.8  HCT 45.5  --   --   --  44.7  PLT 213  --   --   --  201  LABPROT 25.0*  --   --  22.8* 25.2*  INR 2.30*  --   --  2.02* 2.31*  CREATININE 1.55*  --   --  1.48* 1.30*  TROPONINI  --  0.04* 0.04* 0.05*  --     Estimated Creatinine Clearance: 46.8 mL/min (by C-G formula based on Cr of 1.3).    Assessment: Anticoagulation-chronic Coumadin PTA for h/o afib, DVT & PE in 2007 Had hospitalization for epistaxis in March 2015. INR 2.3 on admit 5/31 PM.  Prior INR goal 1.8-2.2 per Dr. Windell HummingbirdGollan's old outpt notes, but I think they're using 2-3 now. Current home dose per dtr  (pt called)- 2.5 mg MWFSat, 3.75 mg TTSun. INR today 2.31 remains in goal.  Goal of Therapy:  INR 2-3 Monitor platelets by anticoagulation protocol: Yes   Plan:  2.5 mg MWFSat, 3.75 mg TTSun Daily INR   Glorious Flicker S. Merilynn Finlandobertson, PharmD, BCPS Clinical Staff Pharmacist Pager [phone]  Misty Stanleyobertson, Devanie Galanti Stillinger 10/13/2014,10:00 AM

## 2014-10-13 NOTE — Progress Notes (Signed)
Attempted to ween pt down to 6 L nasal cannula, pt desaturated to 82%. Put back on partial non-rebreather. No s/s of distress will continue to monitor

## 2014-10-14 DIAGNOSIS — J8410 Pulmonary fibrosis, unspecified: Secondary | ICD-10-CM | POA: Insufficient documentation

## 2014-10-14 DIAGNOSIS — J810 Acute pulmonary edema: Secondary | ICD-10-CM

## 2014-10-14 DIAGNOSIS — J811 Chronic pulmonary edema: Secondary | ICD-10-CM | POA: Insufficient documentation

## 2014-10-14 LAB — PROTIME-INR
INR: 2.28 — ABNORMAL HIGH (ref 0.00–1.49)
PROTHROMBIN TIME: 24.9 s — AB (ref 11.6–15.2)

## 2014-10-14 LAB — BASIC METABOLIC PANEL
Anion gap: 14 (ref 5–15)
BUN: 41 mg/dL — ABNORMAL HIGH (ref 6–20)
CHLORIDE: 96 mmol/L — AB (ref 101–111)
CO2: 32 mmol/L (ref 22–32)
Calcium: 9.2 mg/dL (ref 8.9–10.3)
Creatinine, Ser: 1.28 mg/dL — ABNORMAL HIGH (ref 0.44–1.00)
GFR calc Af Amer: 44 mL/min — ABNORMAL LOW (ref >60)
GFR calc non Af Amer: 38 mL/min — ABNORMAL LOW (ref >60)
Glucose, Bld: 144 mg/dL — ABNORMAL HIGH (ref 65–99)
Potassium: 4.5 mmol/L (ref 3.5–5.1)
SODIUM: 142 mmol/L (ref 135–145)

## 2014-10-14 LAB — GLUCOSE, CAPILLARY
GLUCOSE-CAPILLARY: 156 mg/dL — AB (ref 65–99)
Glucose-Capillary: 126 mg/dL — ABNORMAL HIGH (ref 65–99)

## 2014-10-14 LAB — BRAIN NATRIURETIC PEPTIDE: B Natriuretic Peptide: 301.7 pg/mL — ABNORMAL HIGH (ref 0.0–100.0)

## 2014-10-14 LAB — HEMOGLOBIN A1C
HEMOGLOBIN A1C: 6 % — AB (ref 4.8–5.6)
MEAN PLASMA GLUCOSE: 126 mg/dL

## 2014-10-14 MED ORDER — METHYLPREDNISOLONE SODIUM SUCC 40 MG IJ SOLR: 40.0000 mg | Freq: Two times a day (BID) | INTRAMUSCULAR | Status: DC

## 2014-10-14 MED ORDER — PREDNISONE 20 MG PO TABS
60.0000 mg | ORAL_TABLET | Freq: Every day | ORAL | Status: DC
  Administered 2014-10-15 – 2014-10-17 (×3): 60 mg via ORAL
  Filled 2014-10-14 (×3): qty 3

## 2014-10-14 NOTE — Progress Notes (Deleted)
PULMONARY / CRITICAL CARE MEDICINE   Name: [name] [name] MRN: [id number] DOB: 03/07/1932    ADMISSION DATE:  10/11/2014 CONSULTATION DATE:  10/12/14  REFERRING MD :  Waymon AmatoHongalgi   CHIEF COMPLAINT:  Respiratory failure   INITIAL PRESENTATION:   81yo female with multiple medical problems including Afib, HTN, dCHF, CKD, chronic respiratory failure r/t COPD, OSA/OHS and ILD (on chronic prednisone) on 5L home O2.  She presented 5/31 with 1 week hx increased SOB, weight gain and BLE edema.  Admitted by [facility] and seen by cardiology for acute on chronic diastolic CHF.  Diuresed gently but overnight continued to worsen requiring NRB and PCCM consulted.   STUDIES:    SIGNIFICANT EVENTS:    SUBJECTIVE:  Feels better  VITAL SIGNS: Temp:  [97.6 F (36.4 C)-98.6 F (37 C)] 97.6 F (36.4 C) (06/03 0400) Pulse Rate:  [56-82] 66 (06/03 0800) Resp:  [21-27] 22 (06/03 0800) BP: (120-133)/(51-66) 133/66 mmHg (06/03 0800) SpO2:  [91 %-98 %] 96 % (06/03 0837) FiO2 (%):  [55 %-100 %] 100 % (06/03 0837) Weight:  [303 lb 9.6 oz (137.712 kg)] 303 lb 9.6 oz (137.712 kg) (06/03 0400) INTAKE / OUTPUT:  Intake/Output Summary (Last 24 hours) at 10/14/14 0943 Last data filed at 10/14/14 0845  Gross per 24 hour  Intake   1080 ml  Output   3200 ml  Net  -2120 ml    PHYSICAL EXAMINATION: General:  Pleasant, obese female, NAD, on partial re-breather mask . NAD Neuro:  Awake, alert, appropriate, MAE, gen weakness  HEENT:  Mm moist, NRB  Cardiovascular:  s1s2 rrr, distant  Lungs:  resps even, mildly tachypneic on NRB,faint scattered wheeze, decreased bs bases  Abdomen:  Obese, soft, non tender  Musculoskeletal:  Warm and dry, 2-3+ BLE edema, venous stasis   LABS:  CBC  Recent Labs Lab 10/11/14 1431 10/13/14 0415  WBC 14.1* 12.4*  HGB 14.4 13.8  HCT 45.5 44.7  PLT 213 201   Coag's  Recent Labs Lab 10/12/14 0802 10/13/14 0415 10/14/14 0420  INR 2.02* 2.31* 2.28*   BMET  Recent  Labs Lab 10/12/14 0802 10/13/14 0415 10/14/14 0420  NA 142 142 142  K 3.2* 3.8 4.5  CL 94* 95* 96*  CO2 36* 33* 32  BUN 31* 28* 41*  CREATININE 1.48* 1.30* 1.28*  GLUCOSE 96 149* 144*   Electrolytes  Recent Labs Lab 10/12/14 0802 10/13/14 0415 10/14/14 0420  CALCIUM 8.8* 8.7* 9.2   Sepsis Markers No results for input(s): LATICACIDVEN, PROCALCITON, O2SATVEN in the last 168 hours. ABG  Recent Labs Lab 10/12/14 0222  PHART 7.446  PCO2ART 53.1*  PO2ART 60.6*   Liver Enzymes No results for input(s): AST, ALT, ALKPHOS, BILITOT, ALBUMIN in the last 168 hours. Cardiac Enzymes  Recent Labs Lab 10/11/14 2036 10/12/14 0049 10/12/14 0802  TROPONINI 0.04* 0.04* 0.05*   Glucose  Recent Labs Lab 10/12/14 2117 10/13/14 0741 10/13/14 1149 10/13/14 1715 10/13/14 2133 10/14/14 0758  GLUCAP 152* 146* 105* 135* 167* 156*    Intake/Output Summary (Last 24 hours) at 10/14/14 0945 Last data filed at 10/14/14 0845  Gross per 24 hour  Intake   1080 ml  Output   3200 ml  Net  -2120 ml    Imaging No results found.   ASSESSMENT / PLAN:  Acute on chronic respiratory failure in setting of Pulmonary edema superimposed on underlying ILD and obesity hypoventilation..  no surgical bx r/t multiple medical problems/ CHF but thought likely IPF. Trace  pos ANA and SSA but rapid worsening of symptoms since January despite prednisone.  >favor edema d/t decomp diastolic HF, OHS/OSA and volume overload  being the larger issue here given she is - 5 liters and now feels better and we are able to wean O2 support. Remains on high flow O2  Plan:    Supplemental O2 as needed Bipap @ HS F/u CXR  Continue budesonide, changed to 0.5mg  BID  Continue duonebs - change to q6h  Cont lasix as long as BUN/creatinine and BP tolerate  Continue PRN albuterol  Continue solumedrol -(baseline 20-30mg  daily pred), will begin taper 6/3   Acute on chronic diastolic CHF  A-Fib  CKD III - baseline  Scr ~1.3 Chronic anticoagulation r/t AFib  DM  Plan Per cards/IM service   Brett Canales [place] Noori ACNP [facility] PCCM Pager [phone] till 3 pm If no answer page [phone] 10/14/2014, 9:43 AM

## 2014-10-14 NOTE — Progress Notes (Signed)
Pt refused insulin this am. Pt stated "I do not have diabetes, so I do not need the insulin." Educated pt about the need for insulin. Will continue to monitor.

## 2014-10-14 NOTE — Progress Notes (Signed)
Placed patient on BIPAP 16/8 80% Patient is tolerating well. RT will continue to monitor.

## 2014-10-14 NOTE — Progress Notes (Signed)
No new complaints No distress Tolerating [place] O2 @ 5 lpm by [place] which is what she wears @ home  Filed Vitals:   10/14/14 0800 10/14/14 0837 10/14/14 1159 10/14/14 1416  BP: 133/66  125/71   Pulse: 66  76   Temp:   97.8 F (36.6 C)   TempSrc:   Oral   Resp: 22  19   Height:      Weight:      SpO2: 96% 96%  92%   NAD, obese JVP cannot be assessed Bibasilar crackles RRR Soft, NT Severe symmetric brawny edema  BMET    Component Value Date/Time   NA 142 10/14/2014 0420   NA 140 10/04/2014 1618   NA 136 06/18/2013 0443   K 4.5 10/14/2014 0420   K 3.2* 01/24/2014 1608   CL 96* 10/14/2014 0420   CL 95* 06/18/2013 0443   CO2 32 10/14/2014 0420   CO2 38* 06/18/2013 0443   GLUCOSE 144* 10/14/2014 0420   GLUCOSE 159* 10/04/2014 1618   GLUCOSE 117* 06/18/2013 0443   BUN 41* 10/14/2014 0420   BUN 61* 10/04/2014 1618   BUN 77* 06/18/2013 0443   CREATININE 1.28* 10/14/2014 0420   CREATININE 1.65* 09/17/2013 1646   CREATININE 1.79* 06/18/2013 0443   CALCIUM 9.2 10/14/2014 0420   CALCIUM 9.6 06/18/2013 0443   GFRNONAA 38* 10/14/2014 0420   GFRNONAA 26* 06/18/2013 0443   GFRAA 44* 10/14/2014 0420   GFRAA 31* 06/18/2013 0443    CBC    Component Value Date/Time   WBC 12.4* 10/13/2014 0415   WBC 18.3* 06/18/2013 0443   RBC 4.35 10/13/2014 0415   RBC 2.94* 06/18/2013 0443   HGB 13.8 10/13/2014 0415   HGB 9.1* 06/18/2013 1320   HCT 44.7 10/13/2014 0415   HCT 38.2 08/04/2014 0420   HCT 26.6* 06/18/2013 0443   PLT 201 10/13/2014 0415   PLT 324 06/18/2013 0443   MCV 102.8* 10/13/2014 0415   MCV 90 06/18/2013 0443   MCH 31.7 10/13/2014 0415   MCH 30.1 06/18/2013 0443   MCHC 30.9 10/13/2014 0415   MCHC 33.3 06/18/2013 0443   RDW 20.6* 10/13/2014 0415   RDW 18.3* 06/18/2013 0443   LYMPHSABS 0.7 03/17/2014 0620   LYMPHSABS 1.7 06/18/2013 0443   MONOABS 0.7 03/17/2014 0620   MONOABS 1.6* 06/18/2013 0443   EOSABS 0.4 03/17/2014 0620   EOSABS 0.3 06/18/2013 0443   BASOSABS  0.0 03/17/2014 0620   BASOSABS 0.1 06/18/2013 0443    No new CXR  IMPRESSION: Acute on chronic respiratory failure - hypoxic History of ILD Diastolic dysfunction Increased pulmonary infiltrates - improving gas exchange with diuresis. Therefore likely edema  PLAN: Change steroids to prednisone 60 mg daily X 3, 40 mg daily X 3, 20 mg daily until seen in follow up with Dr [name] (appt has already been made) Cont diuresis to extent permitted by BP and renal function Should be ready for DC home first of next week  PCCM will sign off. Please call if we can be of further assistance  Billy Fischeravid Partick Musselman, MD ; [facility] Mobile [phone].  After 5:30 PM or weekends, call [phone]

## 2014-10-14 NOTE — Progress Notes (Signed)
ANTICOAGULATION CONSULT NOTE - Follow Up Consult  Pharmacy Consult for Coumadin Indication: atrial fibrillation  No Known Allergies  Patient Measurements: Height: 5\' 4"  (162.6 cm) Weight: (!) 303 lb 9.6 oz (137.712 kg) IBW/kg (Calculated) : 54.7 Heparin Dosing Weight:    Vital Signs: Temp: 97.6 F (36.4 C) (06/03 0400) Temp Source: Oral (06/03 0400) BP: 133/66 mmHg (06/03 0800) Pulse Rate: 66 (06/03 0800)  Labs:  Recent Labs  10/11/14 1431 10/11/14 2036 10/12/14 0049 10/12/14 0802 10/13/14 0415 10/14/14 0420  HGB 14.4  --   --   --  13.8  --   HCT 45.5  --   --   --  44.7  --   PLT 213  --   --   --  201  --   LABPROT 25.0*  --   --  22.8* 25.2* 24.9*  INR 2.30*  --   --  2.02* 2.31* 2.28*  CREATININE 1.55*  --   --  1.48* 1.30* 1.28*  TROPONINI  --  0.04* 0.04* 0.05*  --   --     Estimated Creatinine Clearance: 47.8 mL/min (by C-G formula based on Cr of 1.28).  Assessment: 81 y/o female with chronic diastolic HF and interstitial lung disease presents with increased dyspnea and weight gain.   Anticoagulation-chronic Coumadin PTA for h/o afib, DVT & PE in 2007. Had hospitalization for epistaxis in March 2015. INR 2.3 on admit 5/31 PM.  Prior INR goal 1.8-2.2 per Dr. Windell HummingbirdGollan's old outpt notes, (pt verified they like to keep her INR low) Current home dose per dtr  (pt called)- 2.5 mg MWFSat, 3.75 mg TTSun. INR today 2.28 remains in goal.  Goal of Therapy:  INR 2-3 Monitor platelets by anticoagulation protocol: Yes   Plan:  2.5 mg MWFSat, 3.75 mg TTSun   Tamina Cyphers S. Merilynn Finlandobertson, PharmD, BCPS Clinical Staff Pharmacist Pager [phone]  Misty Stanleyobertson, Sohrab Keelan Stillinger 10/14/2014,10:32 AM

## 2014-10-14 NOTE — Progress Notes (Signed)
PROGRESS NOTE    [name] [name] PET:[id number] DOB: Aug 03, 1931 DOA: 10/11/2014 PCP: Eliezer Lofts, MD  HPI/Brief narrative 81 year old female with history of interstitial lung disease, A. fib, HTN, CKD, chronic diastolic CHF, morbid obesity, chronic respiratory failure on oxygen 5 L/m at home, lives with son and ambulates with the help of a walker, admitted on 10/11/14 with worsening dyspnea. She had recently seen her cardiologist who adjusted down her diuretic dose due to renal insufficiency. She has since gained 4 pounds of weight. She was recently seen by pulmonology for second opinion of her ILD and hospice was recommended which she declined. Prednisone was also in increased. In the ED, BNP elevated, chest x-ray showed cardiomegaly with vascular congestion and possible pulmonary edema. Cardiology was consulted and increased her Lasix dose. On 6/1, she became acutely dyspneic but improved after nebulizations. Pulmonary consulted.   Assessment/Plan:  Acute on chronic hypoxic respiratory failure - Most likely secondary to decompensated CHF complicating underlying COPD and IPF - Treating CHF and COPD aggressively. - Was on high flow oxygen. Titrate oxygen down to maintain saturations between 89-92 percent. - Palliative care met with patient: Patient wishes to continue full code - Pulmonary follow-up appreciated - Improved. Now on oxygen 5 L/m via nasal cannula.  Acute on chronic diastolic CHF - Patient started on high-dose IV Lasix with good diuresis - - 7 L since admission. - Cardiology follow-up appreciated - Improving. Continue IV Lasix at least for a day or 2.  Paroxysmal A. Fib - Sinus rhythm on monitor. Continue diltiazem and Coumadin per pharmacy  Acute on stage III chronic kidney disease  - Monitor creatinine closely while on diuretics  - Creatinine has improved.  Hypokalemia - Replace and follow as needed  Failure to thrive - Multifactorial due to complex multiple medical  conditions. Discussed at length with her. She wishes to remain full code but is agreeable to palliative care consultation for goals of care.  Hyperglycemia - Related to steroids. SSI-patient refuses and hence discontinued. DC CBG checks - Check A1c: 6  GERD  - On H2 blocker   HLD - Continue statins  History of PE - Anticoagulated  DVT prophylaxis: Anticoagulated on Coumadin.  Code Status: Full Family Communication: None at bedside Disposition Plan: DC home when medically stable-possibly 2-3 days.   Consultants:  Cardiology  PCCM  Palliative care medicine  Procedures:  BIPAP PRN  Antibiotics:  None   Subjective: Dyspnea continues to improve. In fact denies dyspnea at this time.  Objective: Filed Vitals:   10/14/14 0400 10/14/14 0800 10/14/14 0837 10/14/14 1159  BP: 123/59 133/66  125/71  Pulse: 56 66  76  Temp: 97.6 F (36.4 C)   97.8 F (36.6 C)  TempSrc: Oral   Oral  Resp: _0 Height: _1  (1.626 m)     Weight: 137.712 kg (303 lb 9.6 oz)     SpO2: 97% 96% 96%     Intake/Output Summary (Last 24 hours) at 10/14/14 1356 Last data filed at 10/14/14 0845  Gross per 24 hour  Intake    600 ml  Output   3200 ml  Net  -2600 ml   Filed Weights   10/11/14 1852 10/13/14 0500 10/14/14 0400  Weight: 123.106 kg (271 lb 6.4 oz) 136.306 kg (300 lb 8 oz) 137.712 kg (303 lb 9.6 oz)     Exam:  General exam: Morbidly obese female lying propped up in bed on oxygen via nasal cannula. No respiratory  distress.  Respiratory system: improved breath sounds. No wheezing or rhonchi. Few basal crackles. No increased work of breathing. Cardiovascular system: S1 & S2 heard, RRR. No JVD, murmurs, gallops, clicks. 1+ pitting bilateral leg edema. Telemetry: Sinus rhythm  Gastrointestinal system: Abdomen is nondistended, soft and nontender. Normal bowel sounds heard. Central nervous system: Alert and oriented. No focal neurological deficits. Extremities: Symmetric 5 x 5  power.   Data Reviewed: Basic Metabolic Panel:  Recent Labs Lab 10/11/14 1431 10/12/14 0802 10/13/14 0415 10/14/14 0420  NA 141 142 142 142  K 4.3 3.2* 3.8 4.5  CL 94* 94* 95* 96*  CO2 34* 36* 33* 32  GLUCOSE 133* 96 149* 144*  BUN 35* 31* 28* 41*  CREATININE 1.55* 1.48* 1.30* 1.28*  CALCIUM 9.1 8.8* 8.7* 9.2   Liver Function Tests: No results for input(s): AST, ALT, ALKPHOS, BILITOT, PROT, ALBUMIN in the last 168 hours. No results for input(s): LIPASE, AMYLASE in the last 168 hours. No results for input(s): AMMONIA in the last 168 hours. CBC:  Recent Labs Lab 10/11/14 1431 10/13/14 0415  WBC 14.1* 12.4*  HGB 14.4 13.8  HCT 45.5 44.7  MCV 100.9* 102.8*  PLT 213 201   Cardiac Enzymes:  Recent Labs Lab 10/11/14 2036 10/12/14 0049 10/12/14 0802  TROPONINI 0.04* 0.04* 0.05*   BNP (last 3 results)  Recent Labs  02/22/14 0947 02/24/14 0332 03/07/14 0648  PROBNP 2319.0* 2410.0* 437.5   CBG:  Recent Labs Lab 10/13/14 1149 10/13/14 1715 10/13/14 2133 10/14/14 0758 10/14/14 1158  GLUCAP 105* 135* 167* 156* 126*    Recent Results (from the past 240 hour(s))  MRSA PCR Screening     Status: Abnormal   Collection Time: 10/11/14  6:49 PM  Result Value Ref Range Status   MRSA by PCR POSITIVE (A) NEGATIVE Final    Comment:        The GeneXpert MRSA Assay (FDA approved for NASAL specimens only), is one component of a comprehensive MRSA colonization surveillance program. It is not intended to diagnose MRSA infection nor to guide or monitor treatment for MRSA infections. RESULT CALLED TO, READ BACK BY AND VERIFIED WITH: RALPH,C RN 0031 10/12/14 MITCHELL,L          Studies: Dg Chest Portable 1 View  10/13/2014   CLINICAL DATA:  81 year old female with a history of pulmonary edema  EXAM: PORTABLE CHEST - 1 VIEW  COMPARISON:  Multiple prior plain film, most recent 10/12/2014. CT 08/03/2014  FINDINGS: Cardiomediastinal silhouette is unchanged with  cardiomegaly. Borders are partially obscured by overlying lung and pleural disease.  Lung volumes remain low with interstitial and airspace opacities. Hemidiaphragms are obscured.  Unremarkable appearance of the visualized skeletal structures.  IMPRESSION: Low lung volumes with worsening edema and likely small bilateral pleural effusions.  Signed,  Dulcy Fanny. Earleen Newport, DO  [facility]  [facility]   Electronically Signed   By: Corrie Mckusick D.O.   On: 10/13/2014 07:51        Scheduled Meds: . allopurinol  300 mg Oral Daily  . antiseptic oral rinse  7 mL Mouth Rinse BID  . aspirin  81 mg Oral Daily  . atorvastatin  10 mg Oral Daily  . budesonide  0.5 mg Nebulization BID  . Chlorhexidine Gluconate Cloth  6 each Topical Q0600  . diltiazem  240 mg Oral Daily  . escitalopram  20 mg Oral Daily  . famotidine  20 mg Oral Daily  . ferrous sulfate  325 mg Oral BID WC  . furosemide  80 mg Intravenous 3 times per day  . gabapentin  300 mg Oral QHS  . insulin aspart  0-5 Units Subcutaneous QHS  . insulin aspart  0-9 Units Subcutaneous TID WC  . ipratropium-albuterol  3 mL Nebulization Q6H  . loratadine  10 mg Oral Daily  . methylPREDNISolone (SOLU-MEDROL) injection  40 mg Intravenous Q12H  . mupirocin ointment  1 application Nasal BID  . polyethylene glycol  17 g Oral Once  . potassium chloride SA  20 mEq Oral Daily  . warfarin  2.5 mg Oral Q M,W,F,Sa-1800  . warfarin  3.75 mg Oral Once per day on Sun Tue Thu  . Warfarin - Pharmacist Dosing Inpatient   Does not apply q1800   Continuous Infusions:   Principal Problem:   Acute on chronic respiratory failure with hypoxemia Active Problems:   OBESITY-MORBID (>100')   Essential hypertension   PAF (paroxysmal atrial fibrillation)   Obstructive sleep apnea   Chronic hypoxemic respiratory failure   CKD (chronic kidney disease) stage 3, GFR 30-59 ml/min   Acute on chronic diastolic heart failure   Hx  pulmonary embolism   ILD (interstitial lung disease)   History of DVT (deep vein thrombosis)   Hypoxia   Chronic anticoagulation    Time spent: 20 minutes.    Vernell Leep, MD, FACP, FHM. [facility] Pager [phone]  If 7PM-7AM, please contact night-coverage [email or web address] Password [email or web address] 10/14/2014, 1:56 PM    LOS: 3 days

## 2014-10-14 NOTE — Care Management (Signed)
  Medicare Important Message given? YES   (If response is "NO", the following Medicare IM given date fields will be blank)   Date Medicare IM given:  10-14-14 Medicare IM given by: Graves-Bigelow, Dhalia Zingaro   

## 2014-10-14 NOTE — Progress Notes (Signed)
Ms. [name] [name] is still in good spirits and happy to be on nasal cannula. She is asking to go home but knows that they want to continue diuresis over the weekend. I reminded her that she wants to be optimized for home and not have to come right back to the hospital. She is still requesting aggressive care but understands her prognosis is poor and that she will die. No changes today.   [name], NP Palliative Medicine Team Pager # [phone] (M-F 8a-5p) Team Phone # [phone] (Nights/Weekends)

## 2014-10-14 NOTE — Progress Notes (Addendum)
Subjective:  She is breathing better today.  Objective:  Vital Signs in the last 24 hours: Temp:  [97.6 F (36.4 C)-98.6 F (37 C)] 97.6 F (36.4 C) (06/03 0400) Pulse Rate:  [56-82] 66 (06/03 0800) Resp:  [21-27] 22 (06/03 0800) BP: (120-133)/(51-66) 133/66 mmHg (06/03 0800) SpO2:  [91 %-98 %] 96 % (06/03 0837) FiO2 (%):  [55 %-100 %] 100 % (06/03 0837) Weight:  [303 lb 9.6 oz (137.712 kg)] 303 lb 9.6 oz (137.712 kg) (06/03 0400)  Intake/Output from previous day:  Intake/Output Summary (Last 24 hours) at 10/14/14 0957 Last data filed at 10/14/14 0845  Gross per 24 hour  Intake   1080 ml  Output   3200 ml  Net  -2120 ml    Physical Exam: General appearance: alert, cooperative and morbidly obese Lungs: crackles bilat Heart: regular rate and rhythm   Rate: 60  Rhythm: normal sinus rhythm  Lab Results:  Recent Labs  10/11/14 1431 10/13/14 0415  WBC 14.1* 12.4*  HGB 14.4 13.8  PLT 213 201    Recent Labs  10/13/14 0415 10/14/14 0420  NA 142 142  K 3.8 4.5  CL 95* 96*  CO2 33* 32  GLUCOSE 149* 144*  BUN 28* 41*  CREATININE 1.30* 1.28*    Recent Labs  10/12/14 0049 10/12/14 0802  TROPONINI 0.04* 0.05*    Recent Labs  10/14/14 0420  INR 2.28*    Scheduled Meds: . allopurinol  300 mg Oral Daily  . antiseptic oral rinse  7 mL Mouth Rinse BID  . aspirin  81 mg Oral Daily  . atorvastatin  10 mg Oral Daily  . budesonide  0.5 mg Nebulization BID  . Chlorhexidine Gluconate Cloth  6 each Topical Q0600  . diltiazem  240 mg Oral Daily  . escitalopram  20 mg Oral Daily  . famotidine  20 mg Oral Daily  . ferrous sulfate  325 mg Oral BID WC  . furosemide  80 mg Intravenous 3 times per day  . gabapentin  300 mg Oral QHS  . insulin aspart  0-5 Units Subcutaneous QHS  . insulin aspart  0-9 Units Subcutaneous TID WC  . ipratropium-albuterol  3 mL Nebulization Q6H  . loratadine  10 mg Oral Daily  . methylPREDNISolone (SOLU-MEDROL) injection  40 mg  Intravenous Q12H  . mupirocin ointment  1 application Nasal BID  . polyethylene glycol  17 g Oral Once  . potassium chloride SA  20 mEq Oral Daily  . warfarin  2.5 mg Oral Q M,W,F,Sa-1800  . warfarin  3.75 mg Oral Once per day on Sun Tue Thu  . Warfarin - Pharmacist Dosing Inpatient   Does not apply q1800   Continuous Infusions:  PRN Meds:.acetaminophen, albuterol, colchicine   Imaging: Dg Chest Portable 1 View  10/13/2014   CLINICAL DATA:  81 year old female with a history of pulmonary edema  EXAM: PORTABLE CHEST - 1 VIEW  COMPARISON:  Multiple prior plain film, most recent 10/12/2014. CT 08/03/2014  FINDINGS: Cardiomediastinal silhouette is unchanged with cardiomegaly. Borders are partially obscured by overlying lung and pleural disease.  Lung volumes remain low with interstitial and airspace opacities. Hemidiaphragms are obscured.  Unremarkable appearance of the visualized skeletal structures.  IMPRESSION: Low lung volumes with worsening edema and likely small bilateral pleural effusions.  Signed,  Yvone Neu. Loreta Ave, DO  [facility]  [facility]   Electronically Signed   By: Gilmer Mor D.O.   On: 10/13/2014  07:51     Cardiac Studies: Echo 08/04/14 Study Conclusions  - Left ventricle: The cavity size was normal. Wall thickness was increased in a pattern of mild LVH. Systolic function was normal. The estimated ejection fraction was in the range of 55% to 60%. Wall motion was normal; there were no regional wall motion abnormalities. Doppler parameters are consistent with abnormal left ventricular relaxation (grade 1 diastolic dysfunction). - Mitral valve: Calcified annulus. There was mild regurgitation. - Left atrium: The atrium was moderately to severely dilated. - Tricuspid valve: There was mild-moderate regurgitation. - Pulmonary arteries: Systolic pressure was mildly to moderately increased.  Assessment/Plan: 81 y/o  morbidly obese female, followed by Dr. Mariah MillingGollan. Her PMH is significant for chronic diastolic HF, chronic respiratory failure secondary to interstitial lung disease/pulmonary fibrosis, sleep apnea, and obesity hypoventilation syndrome. She is followed by Dr. [name]. She is on chronic home O2 (5%L at baseline), C-pap QHS, and chronic prednisone. She also has a h/o PAF, previously on flecainide, h/o DVT and PE in 2007 (now on chronic warfarin), h/o nonobstructive CAD by cath 12/2008, small AAA. Her most recent 2D echo was 07/2014 which demonstrated normal LV systolic function with an EF of 55-60% with G1DD. Also with mild MR and moderate-severely dilated LA.She was admitted 10/11/14 with acute on chronic respiratory failure.    Principal Problem:   Acute on chronic respiratory failure with hypoxemia Active Problems:   Acute on chronic diastolic heart failure   OBESITY-MORBID (>100')   Obstructive sleep apnea   Chronic hypoxemic respiratory failure   CKD (chronic kidney disease) stage 3, GFR 30-59 ml/min   ILD (interstitial lung disease)   Hypoxia   Chronic anticoagulation   Essential hypertension   PAF (paroxysmal atrial fibrillation)   Hx pulmonary embolism   History of DVT (deep vein thrombosis)   PLAN: Wgt not reliable, I/O negative 7L. Continue diuresis, suspect she will probably be here over the weekend.   Corine ShelterLuke Aliyha Fornes PA-C 10/14/2014, 9:57 AM [phone]   Patient seen, examined. Available data reviewed. Agree with findings, assessment, and plan as outlined by Corine ShelterLuke Adair Lauderback, PA-C. The patient continues to improve. She is now on oxygen per nasal cannula. On exam, she has poor air movement through her lungs. Heart is distant and regular without murmur. Lower extremity edema is improving. I agree with plans as outlined above. I think she should continue with IV diuresis over the weekend. Renal function remains stable/improved with ongoing diuresis.  Tonny BollmanMichael Cooper, M.D. 10/14/2014 10:44  AM

## 2014-10-15 ENCOUNTER — Inpatient Hospital Stay (HOSPITAL_COMMUNITY): Payer: Medicare Other

## 2014-10-15 LAB — BASIC METABOLIC PANEL
ANION GAP: 11 (ref 5–15)
BUN: 50 mg/dL — ABNORMAL HIGH (ref 6–20)
CALCIUM: 8.7 mg/dL — AB (ref 8.9–10.3)
CHLORIDE: 95 mmol/L — AB (ref 101–111)
CO2: 36 mmol/L — ABNORMAL HIGH (ref 22–32)
Creatinine, Ser: 1.2 mg/dL — ABNORMAL HIGH (ref 0.44–1.00)
GFR calc Af Amer: 48 mL/min — ABNORMAL LOW (ref >60)
GFR calc non Af Amer: 41 mL/min — ABNORMAL LOW (ref >60)
Glucose, Bld: 126 mg/dL — ABNORMAL HIGH (ref 65–99)
Potassium: 3.9 mmol/L (ref 3.5–5.1)
Sodium: 142 mmol/L (ref 135–145)

## 2014-10-15 LAB — GLUCOSE, CAPILLARY: GLUCOSE-CAPILLARY: 110 mg/dL — AB (ref 65–99)

## 2014-10-15 LAB — PROTIME-INR
INR: 2.82 — ABNORMAL HIGH (ref 0.00–1.49)
Prothrombin Time: 29.2 seconds — ABNORMAL HIGH (ref 11.6–15.2)

## 2014-10-15 MED ORDER — WARFARIN SODIUM 2.5 MG PO TABS
1.2500 mg | ORAL_TABLET | Freq: Once | ORAL | Status: AC
  Administered 2014-10-15: 1.25 mg via ORAL
  Filled 2014-10-15: qty 1

## 2014-10-15 MED ORDER — ASPIRIN EC 81 MG PO TBEC
81.0000 mg | DELAYED_RELEASE_TABLET | Freq: Every day | ORAL | Status: DC
  Administered 2014-10-15 – 2014-10-20 (×6): 81 mg via ORAL
  Filled 2014-10-15 (×4): qty 1

## 2014-10-15 MED ORDER — BUDESONIDE 0.5 MG/2ML IN SUSP
0.5000 mg | Freq: Two times a day (BID) | RESPIRATORY_TRACT | Status: DC
  Administered 2014-10-15 – 2014-10-20 (×10): 0.5 mg via RESPIRATORY_TRACT
  Filled 2014-10-15 (×9): qty 2

## 2014-10-15 NOTE — Progress Notes (Signed)
ANTICOAGULATION CONSULT NOTE - Follow Up Consult  Pharmacy Consult for Coumadin Indication: atrial fibrillation  No Known Allergies  Patient Measurements: Height: 5\' 4"  (162.6 cm) Weight: 293 lb 3.2 oz (132.995 kg) IBW/kg (Calculated) : 54.7 Heparin Dosing Weight:    Vital Signs: Temp: 97 F (36.1 C) (06/04 0759) Temp Source: Oral (06/04 0500) BP: 132/62 mmHg (06/04 0759) Pulse Rate: 64 (06/04 0759)  Labs:  Recent Labs  10/13/14 0415 10/14/14 0420 10/15/14 0249  HGB 13.8  --   --   HCT 44.7  --   --   PLT 201  --   --   LABPROT 25.2* 24.9* 29.2*  INR 2.31* 2.28* 2.82*  CREATININE 1.30* 1.28* 1.20*    Estimated Creatinine Clearance: 49.9 mL/min (by C-G formula based on Cr of 1.2).  Assessment: 81 y/o female admitted 10/11/2014 with increased dyspnea and weight gain.  Anticoagulation-chronic Coumadin PTA for h/o afib, DVT & PE in 2007. Had hospitalization for epistaxis in March 2015 with INR of 3.7.   INR goal 1.8-2.2 per outpatient goal - see Dr. Daphine DeutscherBedsole's encounter (08/11/14). INR 2.82 which is SUPRAtherapeutic for patient specific goal, likely due to methylprednisone administration, which has now been d/ced and patient is currently on prednisone 60 mg daily (home dose 20 mg daily).   Current home dose - 2.5 mg MWFSat, 3.75 mg TTSun.   Goal of Therapy:  Patient specific INR goal of 1.8-2.2  Monitor platelets by anticoagulation protocol: Yes   Plan:  - Coumadin 1.25 mg x 1 tonight - Daily INR - Monitor for s/sx of bleeding   [name] CraverStacey [name], PharmD, BCPS Pharmacy Resident

## 2014-10-15 NOTE — Progress Notes (Signed)
Patient's o2 sat dropped to 79-80% with movement in bed to get on bedpan.  Patient recovered after several minutes of slow deep breaths on 6LO2 [place].  Last O2 sat=91% on 6L

## 2014-10-15 NOTE — Procedures (Signed)
Pt placed on BIPAP and tolerating well.  RT will continue to monitor pt.

## 2014-10-15 NOTE — Progress Notes (Signed)
  Subjective:  Breathing is better today.  Weight is down 10 pounds from yesterday and wonder if the weights are actually accurate.  Continues on oxygen.  Creatinine down but BUN rising.  Objective:  Vital Signs in the last 24 hours: BP 132/62 mmHg  Pulse 64  Temp(Src) 97 F (36.1 C) (Oral)  Resp 23  Ht 5\' 4"  (1.626 m)  Wt 132.995 kg (293 lb 3.2 oz)  BMI 50.30 kg/m2  SpO2 90%  Physical Exam: Severely obese white female pleasant currently in no acute distress Lungs: Crackles bilaterally  Cardiac:  Regular rhythm, normal S1 and S2, no S3 Abdomen:  Soft, nontender, no masses Extremities: 1+ edema, large discoloration noted on the right leg   Intake/Output from previous day: 06/03 0701 - 06/04 0700 In: -  Out: 3125 [Urine:3125] Weight Filed Weights   10/13/14 0500 10/14/14 0400 10/15/14 0500  Weight: 136.306 kg (300 lb 8 oz) 137.712 kg (303 lb 9.6 oz) 132.995 kg (293 lb 3.2 oz)    Lab Results: Basic Metabolic Panel:  Recent Labs  29/56/2104/07/26 0420 10/15/14 0249  NA 142 142  K 4.5 3.9  CL 96* 95*  CO2 32 36*  GLUCOSE 144* 126*  BUN 41* 50*  CREATININE 1.28* 1.20*    CBC:  Recent Labs  10/13/14 0415  WBC 12.4*  HGB 13.8  HCT 44.7  MCV 102.8*  PLT 201    BNP    Component Value Date/Time   BNP 301.7* 10/14/2014 0420   Telemetry: Sinus with PACs  Assessment/Plan:   1.  Chronic interstitial lung disease oxygen dependent 2.  Morbid obesity with obesity hypoventilation syndrome 3.  Chronic diastolic heart failure currently diuresing  Recommendations:  Continue diuresis over weekend.  Wonder about accuracy of weights.     Darden PalmerW. Spencer Francee Setzer, Jr.  MD [facility]  10/15/2014, 9:35 AM

## 2014-10-15 NOTE — Progress Notes (Addendum)
PROGRESS NOTE    [name] [name] GTX:[id number] DOB: [birth year] DOA: 10/11/2014 PCP: Eliezer Lofts, MD  HPI/Brief narrative 81 year old female with history of interstitial lung disease, A. fib, HTN, CKD, chronic diastolic CHF, morbid obesity, chronic respiratory failure on oxygen 5 L/m at home, lives with son and ambulates with the help of a walker, admitted on 10/11/14 with worsening dyspnea. She had recently seen her cardiologist who adjusted down her diuretic dose due to renal insufficiency. She has since gained 4 pounds of weight. She was recently seen by pulmonology for second opinion of her ILD and hospice was recommended which she declined. Prednisone was also in increased. In the ED, BNP elevated, chest x-ray showed cardiomegaly with vascular congestion and possible pulmonary edema. Cardiology was consulted and increased her Lasix dose. On 6/1, she became acutely dyspneic but improved after nebulizations. Pulmonary consulted.   Assessment/Plan:  Acute on chronic hypoxic respiratory failure - Most likely secondary to decompensated CHF complicating underlying COPD and IPF - Treating CHF and COPD aggressively. - Was on high flow oxygen. Titrate oxygen down to maintain saturations between 89-92 percent. - Palliative care met with patient: Patient wishes to continue full code - Pulmonary follow-up appreciated - Improved. Now on oxygen 5 L/m via nasal cannula.  Acute on chronic diastolic CHF - Patient started on high-dose IV Lasix with good diuresis - - 7.7 L since admission. - Cardiology follow-up appreciated - Improving. Continue IV Lasix at least for a day or 2.  Paroxysmal A. Fib - Sinus rhythm on monitor. Continue diltiazem and Coumadin per pharmacy  Acute on stage III chronic kidney disease  - Monitor creatinine closely while on diuretics  - Creatinine has improved.  Hypokalemia - Replace and follow as needed  Failure to thrive - Multifactorial due to complex multiple  medical conditions. Discussed at length with her. She wishes to remain full code but is agreeable to palliative care consultation for goals of care.  Hyperglycemia - Related to steroids. SSI-patient refuses and hence discontinued. DC CBG checks - Check A1c: 6  GERD  - On H2 blocker   HLD - Continue statins  History of PE - Anticoagulated  DVT prophylaxis: Anticoagulated on Coumadin.  Code Status: Full Family Communication: None at bedside. Unsuccessfully attempted to reach patient's son Mr. Dallis Darden (as per patient request). Disposition Plan: DC home when medically stable-possibly 48 hours days.   Consultants:  Cardiology  PCCM  Palliative care medicine  Procedures:  BIPAP PRN  Antibiotics:  None   Subjective: Denies dyspnea but gets hypoxic with minimal activity.  Objective: Filed Vitals:   10/15/14 1033 10/15/14 1106 10/15/14 1322 10/15/14 1534  BP:  126/63 139/67   Pulse:   74   Temp:   97.7 F (36.5 C)   TempSrc:   Oral   Resp:   23   Height:      Weight:      SpO2: 91%  92% 91%    Intake/Output Summary (Last 24 hours) at 10/15/14 1712 Last data filed at 10/15/14 1323  Gross per 24 hour  Intake   1900 ml  Output   1975 ml  Net    -75 ml   Filed Weights   10/13/14 0500 10/14/14 0400 10/15/14 0500  Weight: 136.306 kg (300 lb 8 oz) 137.712 kg (303 lb 9.6 oz) 132.995 kg (293 lb 3.2 oz)     Exam:  General exam: Morbidly obese female lying propped up in bed on oxygen via nasal cannula. No  respiratory distress.  Respiratory system: improved breath sounds. No wheezing or rhonchi. Clear. No increased work of breathing. Cardiovascular system: S1 & S2 heard, RRR. No JVD, murmurs, gallops, clicks. 1+ pitting bilateral leg edema- decreasing. Telemetry: Sinus rhythm  Gastrointestinal system: Abdomen is nondistended, soft and nontender. Normal bowel sounds heard. Central nervous system: Alert and oriented. No focal neurological  deficits. Extremities: Symmetric 5 x 5 power.   Data Reviewed: Basic Metabolic Panel:  Recent Labs Lab 10/11/14 1431 10/12/14 0802 10/13/14 0415 10/14/14 0420 10/15/14 0249  NA 141 142 142 142 142  K 4.3 3.2* 3.8 4.5 3.9  CL 94* 94* 95* 96* 95*  CO2 34* 36* 33* 32 36*  GLUCOSE 133* 96 149* 144* 126*  BUN 35* 31* 28* 41* 50*  CREATININE 1.55* 1.48* 1.30* 1.28* 1.20*  CALCIUM 9.1 8.8* 8.7* 9.2 8.7*   Liver Function Tests: No results for input(s): AST, ALT, ALKPHOS, BILITOT, PROT, ALBUMIN in the last 168 hours. No results for input(s): LIPASE, AMYLASE in the last 168 hours. No results for input(s): AMMONIA in the last 168 hours. CBC:  Recent Labs Lab 10/11/14 1431 10/13/14 0415  WBC 14.1* 12.4*  HGB 14.4 13.8  HCT 45.5 44.7  MCV 100.9* 102.8*  PLT 213 201   Cardiac Enzymes:  Recent Labs Lab 10/11/14 2036 10/12/14 0049 10/12/14 0802  TROPONINI 0.04* 0.04* 0.05*   BNP (last 3 results)  Recent Labs  02/22/14 0947 02/24/14 0332 03/07/14 0648  PROBNP 2319.0* 2410.0* 437.5   CBG:  Recent Labs Lab 10/13/14 1715 10/13/14 2133 10/14/14 0758 10/14/14 1158 10/15/14 0755  GLUCAP 135* 167* 156* 126* 110*    Recent Results (from the past 240 hour(s))  MRSA PCR Screening     Status: Abnormal   Collection Time: 10/11/14  6:49 PM  Result Value Ref Range Status   MRSA by PCR POSITIVE (A) NEGATIVE Final    Comment:        The GeneXpert MRSA Assay (FDA approved for NASAL specimens only), is one component of a comprehensive MRSA colonization surveillance program. It is not intended to diagnose MRSA infection nor to guide or monitor treatment for MRSA infections. RESULT CALLED TO, READ BACK BY AND VERIFIED WITH: RALPH,C RN 0031 10/12/14 MITCHELL,L          Studies: Dg Chest Port 1 View  10/15/2014   CLINICAL DATA:  Respiratory failure, follow-up exam.  EXAM: PORTABLE CHEST - 1 VIEW  COMPARISON:  10/13/2014  FINDINGS: Mild enlargement of the  cardiopericardial silhouette. No mediastinal or hilar masses.  Lung volumes remain low. There is vascular congestion and mild interstitial prominence. Some hazy opacity noted on the previous exam has improved. No new lung opacities. Probable small effusions.  IMPRESSION: 1. Mild improvement in lung aeration since the prior exam. 2. There still vascular congestion with mild interstitial prominence and mild cardiomegaly. Combination suggests mild congestive heart failure.   Electronically Signed   By: Lajean Manes M.D.   On: 10/15/2014 07:52        Scheduled Meds: . allopurinol  300 mg Oral Daily  . antiseptic oral rinse  7 mL Mouth Rinse BID  . aspirin EC  81 mg Oral Daily  . atorvastatin  10 mg Oral Daily  . budesonide  0.5 mg Nebulization BID  . Chlorhexidine Gluconate Cloth  6 each Topical Q0600  . diltiazem  240 mg Oral Daily  . escitalopram  20 mg Oral Daily  . famotidine  20 mg Oral Daily  .  ferrous sulfate  325 mg Oral BID WC  . furosemide  80 mg Intravenous 3 times per day  . gabapentin  300 mg Oral QHS  . ipratropium-albuterol  3 mL Nebulization Q6H  . loratadine  10 mg Oral Daily  . mupirocin ointment  1 application Nasal BID  . polyethylene glycol  17 g Oral Once  . potassium chloride SA  20 mEq Oral Daily  . predniSONE  60 mg Oral Q breakfast  . warfarin  1.25 mg Oral ONCE-1800  . Warfarin - Pharmacist Dosing Inpatient   Does not apply q1800   Continuous Infusions:   Principal Problem:   Acute on chronic respiratory failure with hypoxemia Active Problems:   OBESITY-MORBID (>100')   Essential hypertension   PAF (paroxysmal atrial fibrillation)   Obstructive sleep apnea   Chronic hypoxemic respiratory failure   CKD (chronic kidney disease) stage 3, GFR 30-59 ml/min   Acute on chronic diastolic heart failure   Hx pulmonary embolism   ILD (interstitial lung disease)   History of DVT (deep vein thrombosis)   Hypoxia   Chronic anticoagulation   Pulmonary edema    Pulmonary fibrosis    Time spent: 20 minutes.    Vernell Leep, MD, FACP, FHM. [facility] Pager [phone]  If 7PM-7AM, please contact night-coverage [email or web address] Password [email or web address] 10/15/2014, 5:12 PM    LOS: 4 days

## 2014-10-16 LAB — BASIC METABOLIC PANEL
ANION GAP: 12 (ref 5–15)
BUN: 50 mg/dL — ABNORMAL HIGH (ref 6–20)
CO2: 36 mmol/L — AB (ref 22–32)
CREATININE: 1.34 mg/dL — AB (ref 0.44–1.00)
Calcium: 8.8 mg/dL — ABNORMAL LOW (ref 8.9–10.3)
Chloride: 96 mmol/L — ABNORMAL LOW (ref 101–111)
GFR calc Af Amer: 42 mL/min — ABNORMAL LOW (ref >60)
GFR, EST NON AFRICAN AMERICAN: 36 mL/min — AB (ref >60)
GLUCOSE: 99 mg/dL (ref 65–99)
POTASSIUM: 3.6 mmol/L (ref 3.5–5.1)
Sodium: 144 mmol/L (ref 135–145)

## 2014-10-16 LAB — PROTIME-INR
INR: 2.72 — ABNORMAL HIGH (ref 0.00–1.49)
Prothrombin Time: 28.4 seconds — ABNORMAL HIGH (ref 11.6–15.2)

## 2014-10-16 MED ORDER — WARFARIN SODIUM 2.5 MG PO TABS
2.5000 mg | ORAL_TABLET | Freq: Once | ORAL | Status: AC
  Administered 2014-10-16: 2.5 mg via ORAL
  Filled 2014-10-16: qty 1

## 2014-10-16 NOTE — Progress Notes (Signed)
ANTICOAGULATION CONSULT NOTE - Follow Up Consult  Pharmacy Consult for Coumadin Indication: atrial fibrillation  No Known Allergies  Patient Measurements: Height: 5\' 4"  (162.6 cm) Weight: 294 lb 3.2 oz (133.448 kg) IBW/kg (Calculated) : 54.7  Vital Signs: Temp: 97.4 F (36.3 C) (06/05 0418) Temp Source: Axillary (06/05 0418) BP: 131/62 mmHg (06/05 0418) Pulse Rate: 59 (06/05 0418)  Labs:  Recent Labs  10/14/14 0420 10/15/14 0249 10/16/14 0351  LABPROT 24.9* 29.2* 28.4*  INR 2.28* 2.82* 2.72*  CREATININE 1.28* 1.20* 1.34*    Estimated Creatinine Clearance: 44.8 mL/min (by C-G formula based on Cr of 1.34).  Assessment: 81 y/o female admitted 10/11/2014 with increased dyspnea and weight gain. Patient on chronic Coumadin PTA for h/o afib, DVT & PE in 2007. Had hospitalization for epistaxis in March 2015 with INR of 3.7. INR on admission slightly above goal at 2.3.  INR remains above goal but has trended down some to 2.72 after decreased dose given yesterday evening. Of note, elevation in INR may be due to recent administration of methylpred which has now been discontinued and continued increased prednisone dose (home dose 20 mg daily).   Current home dose - 2.5 mg MWFSat, 3.75 mg TTSun.   Goal of Therapy:  Patient specific INR goal of 1.8-2.2 see Dr. Daphine DeutscherBedsole's encounter (08/11/14) Monitor platelets by anticoagulation protocol: Yes   Plan:  - Coumadin 2.5 mg x 1 tonight (decreased from home dose) - Daily INR - Monitor for s/sx of bleeding   Julita Ozbun K. Bonnye FavaNicolsen, PharmD, BCPS Clinical Pharmacist - Resident Pager: [phone] Pharmacy: [phone] 10/16/2014 8:07 AM

## 2014-10-16 NOTE — Progress Notes (Signed)
  Subjective:  Still hypoxic but breathing okay.  Weight is back up today although she is diuresing according to records.  Still with some crackles bilaterally.  1+ edema is currently noted.  INR is therapeutic.  Renal function is stable.  No chest pain.   Objective:  Vital Signs in the last 24 hours: BP 131/62 mmHg  Pulse 59  Temp(Src) 97.4 F (36.3 C) (Axillary)  Resp 18  Ht 5\' 4"  (1.626 m)  Wt 133.448 kg (294 lb 3.2 oz)  BMI 50.47 kg/m2  SpO2 96%  Physical Exam: Severely obese white female pleasant currently in no acute distress Lungs: Crackles bilaterally  Cardiac:  Regular rhythm, normal S1 and S2, no S3 Abdomen:  Soft, nontender, no masses Extremities: 1+ edema, large discoloration noted on the right leg   Intake/Output from previous day: 06/04 0701 - 06/05 0700 In: 840 [P.O.:840] Out: 2425 [Urine:2425] Weight Filed Weights   10/14/14 0400 10/15/14 0500 10/16/14 0418  Weight: 137.712 kg (303 lb 9.6 oz) 132.995 kg (293 lb 3.2 oz) 133.448 kg (294 lb 3.2 oz)    Lab Results: Basic Metabolic Panel:  Recent Labs  40/98/1104/08/26 0249 10/16/14 0351  NA 142 144  K 3.9 3.6  CL 95* 96*  CO2 36* 36*  GLUCOSE 126* 99  BUN 50* 50*  CREATININE 1.20* 1.34*    CBC: No results for input(s): WBC, NEUTROABS, HGB, HCT, MCV, PLT in the last 72 hours.  BNP    Component Value Date/Time   BNP 301.7* 10/14/2014 0420   Telemetry: Sinus with PACs  Assessment/Plan:   1.  Chronic interstitial lung disease oxygen dependent 2.  Morbid obesity with obesity hypoventilation syndrome 3.  Chronic diastolic heart failure currently diuresing  Recommendations:  Continue oxygen and intravenous diuresis.  Allow patient to get out of bed today.     Darden PalmerW. Spencer Tilley, Jr.  MD [facility] CenterFACC Cardiology  10/16/2014, 8:30 AM

## 2014-10-16 NOTE — Progress Notes (Signed)
PROGRESS NOTE    [name] [name] NAT:[id number] DOB: 09-09-1931 DOA: 10/11/2014 PCP: Eliezer Lofts, MD  HPI/Brief narrative 81 year old female with history of interstitial lung disease, A. fib, HTN, CKD, chronic diastolic CHF, morbid obesity, chronic respiratory failure on oxygen 5 L/m at home, lives with son and ambulates with the help of a walker, admitted on 10/11/14 with worsening dyspnea. She had recently seen her cardiologist who adjusted down her diuretic dose due to renal insufficiency. She has since gained 4 pounds of weight. She was recently seen by pulmonology for second opinion of her ILD and hospice was recommended which she declined. Prednisone was also in increased. In the ED, BNP elevated, chest x-ray showed cardiomegaly with vascular congestion and possible pulmonary edema. Cardiology was consulted and increased her Lasix dose. On 6/1, she became acutely dyspneic but improved after nebulizations. Pulmonary consulted.   Assessment/Plan:  Acute on chronic hypoxic respiratory failure - Most likely secondary to decompensated CHF complicating underlying COPD and IPF - Treating CHF and COPD aggressively. - Was on high flow oxygen. Titrate oxygen down to maintain saturations between 89-92 percent. - Palliative care met with patient: Patient wishes to continue full code - Pulmonary follow-up appreciated - Improved. Now on oxygen 5 L/m via nasal cannula. - Gets hypoxic with minimal activity. Try to mobilize with PT  Acute on chronic diastolic CHF - Patient started on high-dose IV Lasix with good diuresis - - 11.495 L since admission. - Cardiology follow-up appreciated - Improving. Continue IV Lasix at least for a day. Creatinine starting to rise.  Paroxysmal A. Fib - Sinus rhythm on monitor. Continue diltiazem and Coumadin per pharmacy  Acute on stage III chronic kidney disease  - Monitor creatinine closely while on diuretics  - Creatinine has improved. Creatinine starting to  rise up. Follow BMP in a.m.  Hypokalemia - Replace and follow as needed  Failure to thrive - Multifactorial due to complex multiple medical conditions. Discussed at length with her. She wishes to remain full code but is agreeable to palliative care consultation for goals of care.  Hyperglycemia - Related to steroids. SSI-patient refuses and hence discontinued. DC CBG checks - Check A1c: 6  GERD  - On H2 blocker   HLD - Continue statins  History of PE - Anticoagulated  DVT prophylaxis: Anticoagulated on Coumadin.  Code Status: Full Family Communication: None at bedside. Unsuccessfully attempted to reach patient's son Mr. Alizzon Dioguardi (as per patient request) on 6/5. Disposition Plan: To be determined pending PT eval.? SNF   Consultants:  Cardiology  PCCM  Palliative care medicine  Procedures:  BIPAP PRN  Antibiotics:  None   Subjective: Denies dyspnea but gets hypoxic with minimal activity.  Objective: Filed Vitals:   10/16/14 0722 10/16/14 0730 10/16/14 0807 10/16/14 1305  BP:   130/51 137/58  Pulse:   73 79  Temp:    97.8 F (36.6 C)  TempSrc:    Axillary  Resp:   23 23  Height:      Weight:      SpO2: 97% 96% 86% 87%    Intake/Output Summary (Last 24 hours) at 10/16/14 1307 Last data filed at 10/16/14 1300  Gross per 24 hour  Intake   1200 ml  Output   2125 ml  Net   -925 ml   Filed Weights   10/14/14 0400 10/15/14 0500 10/16/14 0418  Weight: 137.712 kg (303 lb 9.6 oz) 132.995 kg (293 lb 3.2 oz) 133.448 kg (294 lb 3.2 oz)  Exam:  General exam: Morbidly obese female lying propped up in bed on oxygen via nasal cannula. No respiratory distress.  Respiratory system: improved breath sounds. No wheezing or rhonchi. Clear. No increased work of breathing. Cardiovascular system: S1 & S2 heard, RRR. No JVD, murmurs, gallops, clicks. 1+ pitting bilateral leg edema- decreasing. Telemetry: Sinus rhythm- DC'ed Gastrointestinal system: Abdomen is  nondistended, soft and nontender. Normal bowel sounds heard. Central nervous system: Alert and oriented. No focal neurological deficits. Extremities: Symmetric 5 x 5 power.   Data Reviewed: Basic Metabolic Panel:  Recent Labs Lab 10/12/14 0802 10/13/14 0415 10/14/14 0420 10/15/14 0249 10/16/14 0351  NA 142 142 142 142 144  K 3.2* 3.8 4.5 3.9 3.6  CL 94* 95* 96* 95* 96*  CO2 36* 33* 32 36* 36*  GLUCOSE 96 149* 144* 126* 99  BUN 31* 28* 41* 50* 50*  CREATININE 1.48* 1.30* 1.28* 1.20* 1.34*  CALCIUM 8.8* 8.7* 9.2 8.7* 8.8*   Liver Function Tests: No results for input(s): AST, ALT, ALKPHOS, BILITOT, PROT, ALBUMIN in the last 168 hours. No results for input(s): LIPASE, AMYLASE in the last 168 hours. No results for input(s): AMMONIA in the last 168 hours. CBC:  Recent Labs Lab 10/11/14 1431 10/13/14 0415  WBC 14.1* 12.4*  HGB 14.4 13.8  HCT 45.5 44.7  MCV 100.9* 102.8*  PLT 213 201   Cardiac Enzymes:  Recent Labs Lab 10/11/14 2036 10/12/14 0049 10/12/14 0802  TROPONINI 0.04* 0.04* 0.05*   BNP (last 3 results)  Recent Labs  02/22/14 0947 02/24/14 0332 03/07/14 0648  PROBNP 2319.0* 2410.0* 437.5   CBG:  Recent Labs Lab 10/13/14 1715 10/13/14 2133 10/14/14 0758 10/14/14 1158 10/15/14 0755  GLUCAP 135* 167* 156* 126* 110*    Recent Results (from the past 240 hour(s))  MRSA PCR Screening     Status: Abnormal   Collection Time: 10/11/14  6:49 PM  Result Value Ref Range Status   MRSA by PCR POSITIVE (A) NEGATIVE Final    Comment:        The GeneXpert MRSA Assay (FDA approved for NASAL specimens only), is one component of a comprehensive MRSA colonization surveillance program. It is not intended to diagnose MRSA infection nor to guide or monitor treatment for MRSA infections. RESULT CALLED TO, READ BACK BY AND VERIFIED WITH: RALPH,C RN 0031 10/12/14 MITCHELL,L          Studies: Dg Chest Port 1 View  10/15/2014   CLINICAL DATA:   Respiratory failure, follow-up exam.  EXAM: PORTABLE CHEST - 1 VIEW  COMPARISON:  10/13/2014  FINDINGS: Mild enlargement of the cardiopericardial silhouette. No mediastinal or hilar masses.  Lung volumes remain low. There is vascular congestion and mild interstitial prominence. Some hazy opacity noted on the previous exam has improved. No new lung opacities. Probable small effusions.  IMPRESSION: 1. Mild improvement in lung aeration since the prior exam. 2. There still vascular congestion with mild interstitial prominence and mild cardiomegaly. Combination suggests mild congestive heart failure.   Electronically Signed   By: Lajean Manes M.D.   On: 10/15/2014 07:52        Scheduled Meds: . allopurinol  300 mg Oral Daily  . antiseptic oral rinse  7 mL Mouth Rinse BID  . aspirin EC  81 mg Oral Daily  . atorvastatin  10 mg Oral Daily  . budesonide  0.5 mg Nebulization BID  . diltiazem  240 mg Oral Daily  . escitalopram  20 mg Oral Daily  .  famotidine  20 mg Oral Daily  . ferrous sulfate  325 mg Oral BID WC  . furosemide  80 mg Intravenous 3 times per day  . gabapentin  300 mg Oral QHS  . ipratropium-albuterol  3 mL Nebulization Q6H  . loratadine  10 mg Oral Daily  . mupirocin ointment  1 application Nasal BID  . polyethylene glycol  17 g Oral Once  . potassium chloride SA  20 mEq Oral Daily  . predniSONE  60 mg Oral Q breakfast  . warfarin  2.5 mg Oral ONCE-1800  . Warfarin - Pharmacist Dosing Inpatient   Does not apply q1800   Continuous Infusions:   Principal Problem:   Acute on chronic respiratory failure with hypoxemia Active Problems:   OBESITY-MORBID (>100')   Essential hypertension   PAF (paroxysmal atrial fibrillation)   Obstructive sleep apnea   Chronic hypoxemic respiratory failure   CKD (chronic kidney disease) stage 3, GFR 30-59 ml/min   Acute on chronic diastolic heart failure   Hx pulmonary embolism   ILD (interstitial lung disease)   History of DVT (deep vein  thrombosis)   Hypoxia   Chronic anticoagulation   Pulmonary edema   Pulmonary fibrosis    Time spent: 20 minutes.    Vernell Leep, MD, FACP, FHM. [facility] Pager [phone]  If 7PM-7AM, please contact night-coverage [email or web address] Password [email or web address] 10/16/2014, 1:07 PM    LOS: 5 days

## 2014-10-16 NOTE — Progress Notes (Signed)
Placed pt. On bipap for sleep. Pt. Tolerating well at this time.

## 2014-10-17 LAB — BASIC METABOLIC PANEL
ANION GAP: 12 (ref 5–15)
BUN: 44 mg/dL — ABNORMAL HIGH (ref 6–20)
CO2: 36 mmol/L — ABNORMAL HIGH (ref 22–32)
Calcium: 8.8 mg/dL — ABNORMAL LOW (ref 8.9–10.3)
Chloride: 97 mmol/L — ABNORMAL LOW (ref 101–111)
Creatinine, Ser: 1.22 mg/dL — ABNORMAL HIGH (ref 0.44–1.00)
GFR calc non Af Amer: 40 mL/min — ABNORMAL LOW (ref >60)
GFR, EST AFRICAN AMERICAN: 47 mL/min — AB (ref >60)
Glucose, Bld: 90 mg/dL (ref 65–99)
Potassium: 3.7 mmol/L (ref 3.5–5.1)
SODIUM: 145 mmol/L (ref 135–145)

## 2014-10-17 LAB — CBC
HCT: 44.7 % (ref 36.0–46.0)
Hemoglobin: 13.9 g/dL (ref 12.0–15.0)
MCH: 31.7 pg (ref 26.0–34.0)
MCHC: 31.1 g/dL (ref 30.0–36.0)
MCV: 102.1 fL — ABNORMAL HIGH (ref 78.0–100.0)
Platelets: 182 10*3/uL (ref 150–400)
RBC: 4.38 MIL/uL (ref 3.87–5.11)
RDW: 20.1 % — AB (ref 11.5–15.5)
WBC: 11 10*3/uL — ABNORMAL HIGH (ref 4.0–10.5)

## 2014-10-17 LAB — PROTIME-INR
INR: 2.05 — AB (ref 0.00–1.49)
Prothrombin Time: 23 seconds — ABNORMAL HIGH (ref 11.6–15.2)

## 2014-10-17 MED ORDER — WARFARIN SODIUM 2.5 MG PO TABS
2.5000 mg | ORAL_TABLET | Freq: Once | ORAL | Status: AC
  Administered 2014-10-17: 2.5 mg via ORAL
  Filled 2014-10-17: qty 1

## 2014-10-17 MED ORDER — PREDNISONE 20 MG PO TABS
20.0000 mg | ORAL_TABLET | Freq: Every day | ORAL | Status: DC
  Administered 2014-10-18 – 2014-10-20 (×3): 20 mg via ORAL
  Filled 2014-10-17 (×3): qty 1

## 2014-10-17 NOTE — Evaluation (Signed)
Physical Therapy Evaluation Patient Details Name: [name] [name] MRN: [id number] DOB: 11/10/1931 Today's Date: 10/17/2014   History of Present Illness  pt presents with Respiratory Failure.  pt with long hx of Respiratory deficits and on Home O2.  PMH: CHF, Interstitial Lung Disease, A-fib, Gout, PE, and DVT.    Clinical Impression  Pt with long term respiratory difficulties and is on home O2.  Pt indicates she uses 5L O2 at rest and increase to 6L during mobility.  Pt states her O2 sats drop to 60's and 70's with activity at home on 6L O2.  Today during mobility pt decreased to 70% on 6L O2, RN made aware and pt left on 6L O2 to A with recovery.  Pt eager for return to home and has god family support, but would benefit from HHPT at D/C and may want to discuss W/C for energy conservation.      Follow Up Recommendations Home health PT;Supervision/Assistance - 24 hour    Equipment Recommendations  Wheelchair (measurements PT);Wheelchair cushion (measurements PT)    Recommendations for Other Services       Precautions / Restrictions Precautions Precautions: Fall Restrictions Weight Bearing Restrictions: No      Mobility  Bed Mobility Overal bed mobility: Needs Assistance Bed Mobility: Supine to Sit     Supine to sit: Min assist;HOB elevated     General bed mobility comments: pt moves slowly and utilizes bed rails for bringing self up to sitting EOB.  pt SOB with minimal activity.  O2 on 6L with sats 83-87% sitting EOB.    Transfers Overall transfer level: Needs assistance Equipment used: 2 person hand held assist Transfers: Sit to/from UGI CorporationStand;Stand Pivot Transfers Sit to Stand: Min assist;+2 safety/equipment Stand pivot transfers: Min assist;+2 safety/equipment       General transfer comment: pt moves well, but fatigues very quickly.  O2 sats decrease to 70% on 6L O2 after transfer.  pt able to sit and perform pursed lip breathing to bring sats back to mid 80's.     Ambulation/Gait                Stairs            Wheelchair Mobility    Modified Rankin (Stroke Patients Only)       Balance Overall balance assessment: History of Falls                                           Pertinent Vitals/Pain Pain Assessment: No/denies pain    Home Living Family/patient expects to be discharged to:: Private residence Living Arrangements: Children Available Help at Discharge: Family;Personal care attendant;Available 24 hours/day Type of Home: House Home Access: Stairs to enter Entrance Stairs-Rails: Left;Right;Can reach both Entrance Stairs-Number of Steps: 2 Home Layout: One level Home Equipment: Shower seat - built in;Grab bars - tub/shower;Walker - 2 wheels;Transport chair;Bedside commode;Other (comment);Adaptive equipment Additional Comments: Son lives with her; has sitter 6 hours a day    Prior Function Level of Independence: Needs assistance   Gait / Transfers Assistance Needed: Uses RW and has lift chair and elevated height bed.    ADL's / Homemaking Assistance Needed: (A) with cooking/cleaning; reports she can dress and bathe herself         Hand Dominance   Dominant Hand: Right    Extremity/Trunk Assessment   Upper Extremity Assessment: Generalized weakness  Lower Extremity Assessment: Generalized weakness      Cervical / Trunk Assessment: Kyphotic  Communication   Communication: No difficulties  Cognition Arousal/Alertness: Awake/alert Behavior During Therapy: WFL for tasks assessed/performed Overall Cognitive Status: Within Functional Limits for tasks assessed                      General Comments      Exercises        Assessment/Plan    PT Assessment Patient needs continued PT services  PT Diagnosis Difficulty walking;Generalized weakness   PT Problem List Decreased strength;Decreased activity tolerance;Decreased balance;Decreased mobility;Decreased  knowledge of use of DME;Cardiopulmonary status limiting activity;Obesity  PT Treatment Interventions DME instruction;Gait training;Functional mobility training;Therapeutic activities;Stair training;Therapeutic exercise;Balance training;Patient/family education   PT Goals (Current goals can be found in the Care Plan section) Acute Rehab PT Goals Patient Stated Goal: Home PT Goal Formulation: With patient Time For Goal Achievement: 10/31/14 Potential to Achieve Goals: Fair    Frequency Min 3X/week   Barriers to discharge        Co-evaluation               End of Session Equipment Utilized During Treatment: Gait belt;Oxygen Activity Tolerance: Patient limited by fatigue (Limited by O2 sats.) Patient left: in chair;with call bell/phone within reach Nurse Communication: Mobility status         Time: 1000-1038 PT Time Calculation (min) (ACUTE ONLY): 38 min   Charges:   PT Evaluation $Initial PT Evaluation Tier I: 1 Procedure PT Treatments $Therapeutic Activity: 23-37 mins   PT G CodesSunny [name], [name] [name] [phone] 10/17/2014, 1:00 PM

## 2014-10-17 NOTE — Progress Notes (Signed)
PROGRESS NOTE    [name] [name] YQM:[id number] DOB: 11-25-1931 DOA: 10/11/2014 PCP: [name] Lofts, MD  HPI/Brief narrative 81 year old female with history of interstitial lung disease, A. fib, HTN, CKD, chronic diastolic CHF, morbid obesity, chronic respiratory failure on oxygen 5 L/m at home, lives with son and ambulates with the help of a walker, admitted on 10/11/14 with worsening dyspnea. She had recently seen her cardiologist who adjusted down her diuretic dose due to renal insufficiency. She has since gained 4 pounds of weight. She was recently seen by pulmonology for second opinion of her ILD and hospice was recommended which she declined. Prednisone was also in increased. In the ED, BNP elevated, chest x-ray showed cardiomegaly with vascular congestion and possible pulmonary edema. Cardiology was consulted and increased her Lasix dose. On 6/1, she became acutely dyspneic but improved after nebulizations. Pulmonary consulted.   Assessment/Plan:  Acute on chronic hypoxic respiratory failure - Most likely secondary to decompensated CHF complicating underlying COPD and IPF - Treating CHF and COPD aggressively. - Was on high flow oxygen. Titrate oxygen down to maintain saturations between 89-92 percent. - Palliative care met with patient: Patient wishes to continue full code - Pulmonary signed off - Improved. Now on oxygen 5 L/m via nasal cannula. - Gets hypoxic with minimal activity. Try to mobilize with PT  Acute on chronic diastolic CHF - Patient started on high-dose IV Lasix with good diuresis - - 15.58 L since admission. - Cardiology signed off - Improving but remains volume overloaded and hence continue IV Lasix until creatinine starts to rise. Creatinine remains stable.  Paroxysmal A. Fib - Sinus rhythm on monitor. Continue diltiazem and Coumadin per pharmacy  Acute on stage III chronic kidney disease  - Monitor creatinine closely while on diuretics  - Creatinine  stable.  Hypokalemia - Replace and follow as needed  Failure to thrive - Multifactorial due to complex multiple medical conditions. Discussed at length with her. She wishes to remain full code but is agreeable to palliative care consultation for goals of care.  Hyperglycemia - Related to steroids. SSI-patient refuses and hence discontinued. DC CBG checks - Check A1c: 6  GERD  - On H2 blocker   HLD - Continue statins  History of PE - Anticoagulated  DVT prophylaxis: Anticoagulated on Coumadin.  Code Status: Full Family Communication: None at bedside. Unsuccessfully attempted to reach patient's son Mr. [name] [name] (as per patient request) on 6/5. Disposition Plan: To be determined pending PT eval.? SNF   Consultants:  Cardiology  PCCM  Palliative care medicine  Procedures:  BIPAP PRN  Antibiotics:  None   Subjective: Dyspnea not yet at baseline. Gets easily hypoxic in the 70s with minimal activity-apparently chronic even at home as per patient's report.  Objective: Filed Vitals:   10/17/14 0948 10/17/14 0952 10/17/14 1316 10/17/14 1336  BP:    136/51  Pulse:    71  Temp:    97.9 F (36.6 C)  TempSrc:    Oral  Resp:    20  Height:      Weight:      SpO2: 84% 92% 88% 89%    Intake/Output Summary (Last 24 hours) at 10/17/14 1710 Last data filed at 10/17/14 1341  Gross per 24 hour  Intake    540 ml  Output   4025 ml  Net  -3485 ml   Filed Weights   10/15/14 0500 10/16/14 0418 10/17/14 0600  Weight: 132.995 kg (293 lb 3.2 oz) 133.448 kg (294  lb 3.2 oz) 132.586 kg (292 lb 4.8 oz)     Exam:  General exam: Morbidly obese sitting up comfortably on chair this morning.  Respiratory system: improved breath sounds. No wheezing or rhonchi. Few basal crackles. No increased work of breathing. Cardiovascular system: S1 & S2 heard, RRR. No JVD, murmurs, gallops, clicks. 1+ pitting bilateral leg edema- decreasing. Gastrointestinal system: Abdomen is  nondistended, soft and nontender. Normal bowel sounds heard. Central nervous system: Alert and oriented. No focal neurological deficits. Extremities: Symmetric 5 x 5 power.   Data Reviewed: Basic Metabolic Panel:  Recent Labs Lab 10/13/14 0415 10/14/14 0420 10/15/14 0249 10/16/14 0351 10/17/14 0535  NA 142 142 142 144 145  K 3.8 4.5 3.9 3.6 3.7  CL 95* 96* 95* 96* 97*  CO2 33* 32 36* 36* 36*  GLUCOSE 149* 144* 126* 99 90  BUN 28* 41* 50* 50* 44*  CREATININE 1.30* 1.28* 1.20* 1.34* 1.22*  CALCIUM 8.7* 9.2 8.7* 8.8* 8.8*   Liver Function Tests: No results for input(s): AST, ALT, ALKPHOS, BILITOT, PROT, ALBUMIN in the last 168 hours. No results for input(s): LIPASE, AMYLASE in the last 168 hours. No results for input(s): AMMONIA in the last 168 hours. CBC:  Recent Labs Lab 10/11/14 1431 10/13/14 0415 10/17/14 0535  WBC 14.1* 12.4* 11.0*  HGB 14.4 13.8 13.9  HCT 45.5 44.7 44.7  MCV 100.9* 102.8* 102.1*  PLT 213 201 182   Cardiac Enzymes:  Recent Labs Lab 10/11/14 2036 10/12/14 0049 10/12/14 0802  TROPONINI 0.04* 0.04* 0.05*   BNP (last 3 results)  Recent Labs  02/22/14 0947 02/24/14 0332 03/07/14 0648  PROBNP 2319.0* 2410.0* 437.5   CBG:  Recent Labs Lab 10/13/14 1715 10/13/14 2133 10/14/14 0758 10/14/14 1158 10/15/14 0755  GLUCAP 135* 167* 156* 126* 110*    Recent Results (from the past 240 hour(s))  MRSA PCR Screening     Status: Abnormal   Collection Time: 10/11/14  6:49 PM  Result Value Ref Range Status   MRSA by PCR POSITIVE (A) NEGATIVE Final    Comment:        The GeneXpert MRSA Assay (FDA approved for NASAL specimens only), is one component of a comprehensive MRSA colonization surveillance program. It is not intended to diagnose MRSA infection nor to guide or monitor treatment for MRSA infections. RESULT CALLED TO, READ BACK BY AND VERIFIED WITH: RALPH,C RN 0031 10/12/14 MITCHELL,L          Studies: No results  found.      Scheduled Meds: . allopurinol  300 mg Oral Daily  . antiseptic oral rinse  7 mL Mouth Rinse BID  . aspirin EC  81 mg Oral Daily  . atorvastatin  10 mg Oral Daily  . budesonide  0.5 mg Nebulization BID  . diltiazem  240 mg Oral Daily  . escitalopram  20 mg Oral Daily  . famotidine  20 mg Oral Daily  . ferrous sulfate  325 mg Oral BID WC  . furosemide  80 mg Intravenous 3 times per day  . gabapentin  300 mg Oral QHS  . ipratropium-albuterol  3 mL Nebulization Q6H  . loratadine  10 mg Oral Daily  . mupirocin ointment  1 application Nasal BID  . polyethylene glycol  17 g Oral Once  . potassium chloride SA  20 mEq Oral Daily  . predniSONE  60 mg Oral Q breakfast  . Warfarin - Pharmacist Dosing Inpatient   Does not apply q1800   Continuous  Infusions:   Principal Problem:   Acute on chronic respiratory failure with hypoxemia Active Problems:   OBESITY-MORBID (>100')   Essential hypertension   PAF (paroxysmal atrial fibrillation)   Obstructive sleep apnea   Chronic hypoxemic respiratory failure   CKD (chronic kidney disease) stage 3, GFR 30-59 ml/min   Acute on chronic diastolic heart failure   Hx pulmonary embolism   ILD (interstitial lung disease)   History of DVT (deep vein thrombosis)   Hypoxia   Chronic anticoagulation   Pulmonary edema   Pulmonary fibrosis    Time spent: 20 minutes.    Vernell Leep, MD, FACP, FHM. [facility] Pager [phone]  If 7PM-7AM, please contact night-coverage [email or web address] Password [email or web address] 10/17/2014, 5:10 PM    LOS: 6 days

## 2014-10-17 NOTE — Care Management Note (Signed)
Case Management Note  Patient Details  Name: Aneta Minseggy T Rabenold MRN: [id number] Date of Birth: 10/08/1931  Subjective/Objective:   Pt continues to be diuresed with IV lasix tid.                  Action/Plan: CM will continue to monitor for additional needs.   Expected Discharge Date:                  Expected Discharge Plan:  Home w Home Health Services  In-House Referral:     Discharge planning Services  CM Consult  Post Acute Care Choice:  Home Health, Resumption of Svcs/PTA Provider Choice offered to:  Patient  DME Arranged:    DME Agency:     HH Arranged:  RN HH Agency:  [facility]  Status of Service:  In process, will continue to follow  Medicare Important Message Given:  Yes Date Medicare IM Given:  10/17/14 Medicare IM give by:  Tomi BambergerBrenda Graves-Bigelow, RN,BSN Date Additional Medicare IM Given:    Additional Medicare Important Message give by:     If discussed at Long Length of Stay Meetings, dates discussed:    Additional Comments:  Gala LewandowskyGraves-Bigelow, Concetta Guion Kaye, RN 10/17/2014, 4:22 PM

## 2014-10-17 NOTE — Progress Notes (Signed)
UR Completed Jeaneane Adamec Graves-Bigelow, RN,BSN [phone]  

## 2014-10-17 NOTE — Progress Notes (Signed)
Ms. [name] [name] is sitting up in chair today and feeling "better." She is hoping to be going home soon but says she may consider short term rehab - she will need to know her choices. She has no concerns and is still hoping for the best. No change in plan of care.   Yong ChannelAlicia Kenley Troop, NP Palliative Medicine Team Pager # [phone] (M-F 8a-5p) Team Phone # [phone] (Nights/Weekends)

## 2014-10-17 NOTE — Progress Notes (Signed)
ANTICOAGULATION CONSULT NOTE - Follow Up Consult  Pharmacy Consult for Coumadin Indication: atrial fibrillation  No Known Allergies  Patient Measurements: Height: 5\' 4"  (162.6 cm) Weight: 292 lb 4.8 oz (132.586 kg) IBW/kg (Calculated) : 54.7  Vital Signs: Temp: 97.9 F (36.6 C) (06/06 1336) Temp Source: Oral (06/06 1336) BP: 136/51 mmHg (06/06 1336) Pulse Rate: 71 (06/06 1336)  Labs:  Recent Labs  10/15/14 0249 10/16/14 0351 10/17/14 0535  HGB  --   --  13.9  HCT  --   --  44.7  PLT  --   --  182  LABPROT 29.2* 28.4* 23.0*  INR 2.82* 2.72* 2.05*  CREATININE 1.20* 1.34* 1.22*    Estimated Creatinine Clearance: 49 mL/min (by C-G formula based on Cr of 1.22).  Assessment: 81 y/o female admitted 10/11/2014 with increased dyspnea and weight gain. Patient on chronic Coumadin PTA for h/o afib, DVT & PE in 2007. Had hospitalization for epistaxis in March 2015 with INR of 3.7. INR on admission slightly above goal at 2.3.  INR is down to goal at 2.05 today after decreased dose given yesterday evening. Of note, elevation in INR may be due to recent administration of methylpred which has now been discontinued and continued increased prednisone dose (home dose 20 mg daily).   Current home dose - 2.5 mg MWFSat, 3.75 mg TTSun.   Goal of Therapy:  Patient specific INR goal of 1.8-2.2 see Dr. Daphine DeutscherBedsole's encounter (08/11/14) Monitor platelets by anticoagulation protocol: Yes   Plan:  - Coumadin 2.5 mg x 1 tonight  - Daily INR - Monitor for s/sx of bleeding   Vinnie LevelBenjamin Aiden Rao, PharmD., BCPS Clinical Pharmacist Pager [phone]

## 2014-10-17 NOTE — Progress Notes (Signed)
Subjective:  She is breathing better today.  Objective:  Vital Signs in the last 24 hours: Temp:  [97.6 F (36.4 C)-98.4 F (36.9 C)] 97.6 F (36.4 C) (06/06 0536) Pulse Rate:  [64-79] 64 (06/06 0536) Resp:  [20-23] 20 (06/06 0536) BP: (115-139)/(55-71) 115/55 mmHg (06/06 0816) SpO2:  [87 %-99 %] 99 % (06/06 0536) FiO2 (%):  [50 %] 50 % (06/06 0119) Weight:  [292 lb 4.8 oz (132.586 kg)] 292 lb 4.8 oz (132.586 kg) (06/06 0600)  Intake/Output from previous day:  Intake/Output Summary (Last 24 hours) at 10/17/14 0845 Last data filed at 10/17/14 [id number]  Gross per 24 hour  Intake   1020 ml  Output   5625 ml  Net  -4605 ml    Physical Exam: General appearance: alert, cooperative and morbidly obese Lungs: crackles bilat Heart: regular rate and rhythm   Rate: 60  Rhythm: off telemetry  Lab Results:  Recent Labs  10/17/14 0535  WBC 11.0*  HGB 13.9  PLT 182    Recent Labs  10/16/14 0351 10/17/14 0535  NA 144 145  K 3.6 3.7  CL 96* 97*  CO2 36* 36*  GLUCOSE 99 90  BUN 50* 44*  CREATININE 1.34* 1.22*   No results for input(s): TROPONINI in the last 72 hours.  Invalid input(s): CK, MB  Recent Labs  10/17/14 0535  INR 2.05*    Scheduled Meds: . allopurinol  300 mg Oral Daily  . antiseptic oral rinse  7 mL Mouth Rinse BID  . aspirin EC  81 mg Oral Daily  . atorvastatin  10 mg Oral Daily  . budesonide  0.5 mg Nebulization BID  . diltiazem  240 mg Oral Daily  . escitalopram  20 mg Oral Daily  . famotidine  20 mg Oral Daily  . ferrous sulfate  325 mg Oral BID WC  . furosemide  80 mg Intravenous 3 times per day  . gabapentin  300 mg Oral QHS  . ipratropium-albuterol  3 mL Nebulization Q6H  . loratadine  10 mg Oral Daily  . mupirocin ointment  1 application Nasal BID  . polyethylene glycol  17 g Oral Once  . potassium chloride SA  20 mEq Oral Daily  . predniSONE  60 mg Oral Q breakfast  . Warfarin - Pharmacist Dosing Inpatient   Does not apply q1800    Continuous Infusions:  PRN Meds:.acetaminophen, albuterol, colchicine   Imaging: Dg Chest Portable 1 View  10/13/2014   CLINICAL DATA:  81 year old female with a history of pulmonary edema  EXAM: PORTABLE CHEST - 1 VIEW  COMPARISON:  Multiple prior plain film, most recent 10/12/2014. CT 08/03/2014  FINDINGS: Cardiomediastinal silhouette is unchanged with cardiomegaly. Borders are partially obscured by overlying lung and pleural disease.  Lung volumes remain low with interstitial and airspace opacities. Hemidiaphragms are obscured.  Unremarkable appearance of the visualized skeletal structures.  IMPRESSION: Low lung volumes with worsening edema and likely small bilateral pleural effusions.  Signed,  Yvone NeuJaime S. Loreta AveWagner, DO  [facility]  [facility]   Electronically Signed   By: Gilmer MorJaime  Wagner D.O.   On: 10/13/2014 07:51     Cardiac Studies: Echo 08/04/14 Study Conclusions  - Left ventricle: The cavity size was normal. Wall thickness was increased in a pattern of mild LVH. Systolic function was normal. The estimated ejection fraction was in the range of 55% to 60%. Wall motion was normal; there were no regional wall motion abnormalities. Doppler  parameters are consistent with abnormal left ventricular relaxation (grade 1 diastolic dysfunction). - Mitral valve: Calcified annulus. There was mild regurgitation. - Left atrium: The atrium was moderately to severely dilated. - Tricuspid valve: There was mild-moderate regurgitation. - Pulmonary arteries: Systolic pressure was mildly to moderately increased.  Assessment/Plan: 81 y/o morbidly obese female, followed by Dr. Mariah Milling. Her PMH is significant for chronic diastolic HF, chronic respiratory failure secondary to interstitial lung disease/pulmonary fibrosis, sleep apnea, and obesity hypoventilation syndrome. She is followed by Dr. Kendrick Fries. She is on chronic home O2 (5%L at baseline), C-pap  QHS, and chronic prednisone. She also has a h/o PAF, previously on flecainide, h/o DVT and PE in 2007 (now on chronic warfarin), h/o nonobstructive CAD by cath 12/2008, small AAA. Her most recent 2D echo was 07/2014 which demonstrated normal LV systolic function with an EF of 55-60% with G1DD. Also with mild MR and moderate-severely dilated LA.She was admitted 10/11/14 with acute on chronic respiratory failure.    Principal Problem:   Acute on chronic respiratory failure with hypoxemia Active Problems:   Acute on chronic diastolic heart failure   OBESITY-MORBID (>100')   Obstructive sleep apnea   Chronic hypoxemic respiratory failure   CKD (chronic kidney disease) stage 3, GFR 30-59 ml/min   ILD (interstitial lung disease)   Hypoxia   Chronic anticoagulation   Essential hypertension   PAF (paroxysmal atrial fibrillation)   Hx pulmonary embolism   History of DVT (deep vein thrombosis)   Pulmonary edema   Pulmonary fibrosis   PLAN: Bed Wgt not reliable, I/O negative 15L since admission.  Continue diuresis, PT to see today to get her up.   Corine Shelter PA-C 10/17/2014, 8:45 AM [phone]  Primary issue is OSA/ obesity hypoventilation ILD and history of PE.  Continue diuresis  Exam remarkable for basilar crackles and obesity.  Will sign off can have outpatient f/u with Dr Mariah Milling.  Charlton Haws

## 2014-10-18 LAB — BASIC METABOLIC PANEL
Anion gap: 10 (ref 5–15)
BUN: 44 mg/dL — ABNORMAL HIGH (ref 6–20)
CALCIUM: 8.9 mg/dL (ref 8.9–10.3)
CO2: 37 mmol/L — AB (ref 22–32)
CREATININE: 1.2 mg/dL — AB (ref 0.44–1.00)
Chloride: 97 mmol/L — ABNORMAL LOW (ref 101–111)
GFR calc Af Amer: 48 mL/min — ABNORMAL LOW (ref >60)
GFR calc non Af Amer: 41 mL/min — ABNORMAL LOW (ref >60)
GLUCOSE: 110 mg/dL — AB (ref 65–99)
POTASSIUM: 3.2 mmol/L — AB (ref 3.5–5.1)
Sodium: 144 mmol/L (ref 135–145)

## 2014-10-18 LAB — PROTIME-INR
INR: 1.75 — AB (ref 0.00–1.49)
PROTHROMBIN TIME: 20.4 s — AB (ref 11.6–15.2)

## 2014-10-18 MED ORDER — IPRATROPIUM-ALBUTEROL 0.5-2.5 (3) MG/3ML IN SOLN
3.0000 mL | Freq: Four times a day (QID) | RESPIRATORY_TRACT | Status: DC
  Administered 2014-10-19 – 2014-10-20 (×5): 3 mL via RESPIRATORY_TRACT
  Filled 2014-10-18 (×7): qty 3

## 2014-10-18 MED ORDER — POTASSIUM CHLORIDE CRYS ER 20 MEQ PO TBCR
40.0000 meq | EXTENDED_RELEASE_TABLET | Freq: Every day | ORAL | Status: DC
  Administered 2014-10-18 – 2014-10-20 (×3): 40 meq via ORAL
  Filled 2014-10-18 (×3): qty 2

## 2014-10-18 MED ORDER — WARFARIN SODIUM 7.5 MG PO TABS
3.7500 mg | ORAL_TABLET | Freq: Once | ORAL | Status: AC
  Administered 2014-10-18: 3.75 mg via ORAL
  Filled 2014-10-18: qty 2

## 2014-10-18 NOTE — Progress Notes (Signed)
Placed pt on nocturnal bipap per home regimen, rn aware.

## 2014-10-18 NOTE — Progress Notes (Signed)
PROGRESS NOTE    [name] [name] ZSW:[id number] DOB: 07-21-1931 DOA: 10/11/2014 PCP: Eliezer Lofts, MD  HPI/Brief narrative 81 year old female with history of interstitial lung disease, A. fib, HTN, CKD, chronic diastolic CHF, morbid obesity, chronic respiratory failure on oxygen 5 L/m at home, lives with son and ambulates with the help of a walker, admitted on 10/11/14 with worsening dyspnea. She had recently seen her cardiologist who adjusted down her diuretic dose due to renal insufficiency. She has since gained 4 pounds of weight. She was recently seen by pulmonology for second opinion of her ILD and hospice was recommended which she declined. Prednisone was also in increased. In the ED, BNP elevated, chest x-ray showed cardiomegaly with vascular congestion and possible pulmonary edema. Cardiology and pulmonology were consulted-have signed off. Patient is undergoing aggressive IV diuresis and gradually improving. Post discharge, she will have to follow-up with Dr. Lake Bells, pulmonology and Dr. Rockey Situ, cardiology. Hopefully she should be able to discharge in the next 48 hours.   Assessment/Plan:  Acute on chronic hypoxic respiratory failure - Most likely secondary to decompensated CHF complicating underlying COPD and IPF - Treating CHF and COPD aggressively. - Was on high flow oxygen. Titrate oxygen down to maintain saturations between 89-92 percent. - Palliative care met with patient: Patient wishes to continue full code - Pulmonary signed off - Improved. Now on oxygen 5 L/m via nasal cannula. - Gets hypoxic with minimal activity. Try to mobilize with PT  Acute on chronic diastolic CHF - Patient started on high-dose IV Lasix with good diuresis - - 18.6 L since admission. - Cardiology signed off - Improving but remains volume overloaded and hence continue IV Lasix until creatinine starts to rise at which time she can be transitioned to oral Lasix at discharge. Creatinine remains  stable.  Paroxysmal A. Fib - Sinus rhythm on monitor. Continue diltiazem and Coumadin per pharmacy  Acute on stage III chronic kidney disease  - Monitor creatinine closely while on diuretics  - Creatinine stable.  Hypokalemia - Replace and follow as needed  Failure to thrive - Multifactorial due to complex multiple medical conditions. Discussed at length with her. She wishes to remain full code but is agreeable to palliative care consultation for goals of care.  Hyperglycemia - Related to steroids. SSI-patient refuses and hence discontinued. DC CBG checks - Check A1c: 6  GERD  - On H2 blocker   HLD - Continue statins  History of PE - Anticoagulated  DVT prophylaxis: Anticoagulated on Coumadin.  Code Status: Full Family Communication: None at bedside. Unsuccessfully attempted to reach patient's son Mr. Krystelle Prashad (as per patient request) on 6/5. Disposition Plan: DC home possibly in the next 48 hours.   Consultants:  Cardiology-signed off  PCCM-signed off  Palliative care medicine  Procedures:  BIPAP PRN  Foley catheter-continue while on IV Lasix  Antibiotics:  None   Subjective: States that dyspnea and leg swelling gradually continued to improve. As per nursing, still gets easily hypoxic with minimal activity. Patient had been out of bed to chair for a few hours yesterday and has been on chair this morning.  Objective: Filed Vitals:   10/18/14 1052 10/18/14 1100 10/18/14 1340 10/18/14 1411  BP: 138/62 138/62  131/57  Pulse:  77  70  Temp:    98.6 F (37 C)  TempSrc:    Oral  Resp: 20     Height:      Weight:      SpO2:  86% 88% 89%  Intake/Output Summary (Last 24 hours) at 10/18/14 1635 Last data filed at 10/18/14 1217  Gross per 24 hour  Intake    960 ml  Output   3501 ml  Net  -2541 ml   Filed Weights   10/17/14 0600 10/18/14 0508 10/18/14 0653  Weight: 132.586 kg (292 lb 4.8 oz) 130.817 kg (288 lb 6.4 oz) 135.217 kg (298 lb 1.6 oz)      Exam:  General exam: Morbidly obese sitting up comfortably on chair this morning.  Respiratory system: improved breath sounds. No wheezing or rhonchi. Few basal crackles. No increased work of breathing. Cardiovascular system: S1 & S2 heard, RRR. No JVD, murmurs, gallops, clicks. Trace pitting bilateral leg edema- decreasing. Gastrointestinal system: Abdomen is nondistended, soft and nontender. Normal bowel sounds heard. Central nervous system: Alert and oriented. No focal neurological deficits. Extremities: Symmetric 5 x 5 power.   Data Reviewed: Basic Metabolic Panel:  Recent Labs Lab 10/14/14 0420 10/15/14 0249 10/16/14 0351 10/17/14 0535 10/18/14 0347  NA 142 142 144 145 144  K 4.5 3.9 3.6 3.7 3.2*  CL 96* 95* 96* 97* 97*  CO2 32 36* 36* 36* 37*  GLUCOSE 144* 126* 99 90 110*  BUN 41* 50* 50* 44* 44*  CREATININE 1.28* 1.20* 1.34* 1.22* 1.20*  CALCIUM 9.2 8.7* 8.8* 8.8* 8.9   Liver Function Tests: No results for input(s): AST, ALT, ALKPHOS, BILITOT, PROT, ALBUMIN in the last 168 hours. No results for input(s): LIPASE, AMYLASE in the last 168 hours. No results for input(s): AMMONIA in the last 168 hours. CBC:  Recent Labs Lab 10/13/14 0415 10/17/14 0535  WBC 12.4* 11.0*  HGB 13.8 13.9  HCT 44.7 44.7  MCV 102.8* 102.1*  PLT 201 182   Cardiac Enzymes:  Recent Labs Lab 10/11/14 2036 10/12/14 0049 10/12/14 0802  TROPONINI 0.04* 0.04* 0.05*   BNP (last 3 results)  Recent Labs  02/22/14 0947 02/24/14 0332 03/07/14 0648  PROBNP 2319.0* 2410.0* 437.5   CBG:  Recent Labs Lab 10/13/14 1715 10/13/14 2133 10/14/14 0758 10/14/14 1158 10/15/14 0755  GLUCAP 135* 167* 156* 126* 110*    Recent Results (from the past 240 hour(s))  MRSA PCR Screening     Status: Abnormal   Collection Time: 10/11/14  6:49 PM  Result Value Ref Range Status   MRSA by PCR POSITIVE (A) NEGATIVE Final    Comment:        The GeneXpert MRSA Assay (FDA approved for NASAL  specimens only), is one component of a comprehensive MRSA colonization surveillance program. It is not intended to diagnose MRSA infection nor to guide or monitor treatment for MRSA infections. RESULT CALLED TO, READ BACK BY AND VERIFIED WITH: RALPH,C RN 0031 10/12/14 MITCHELL,L          Studies: No results found.      Scheduled Meds: . allopurinol  300 mg Oral Daily  . antiseptic oral rinse  7 mL Mouth Rinse BID  . aspirin EC  81 mg Oral Daily  . atorvastatin  10 mg Oral Daily  . budesonide  0.5 mg Nebulization BID  . diltiazem  240 mg Oral Daily  . escitalopram  20 mg Oral Daily  . famotidine  20 mg Oral Daily  . ferrous sulfate  325 mg Oral BID WC  . furosemide  80 mg Intravenous 3 times per day  . gabapentin  300 mg Oral QHS  . ipratropium-albuterol  3 mL Nebulization Q6H  . loratadine  10 mg Oral Daily  .  polyethylene glycol  17 g Oral Once  . potassium chloride SA  40 mEq Oral Daily  . predniSONE  20 mg Oral Q breakfast  . warfarin  3.75 mg Oral ONCE-1800  . Warfarin - Pharmacist Dosing Inpatient   Does not apply q1800   Continuous Infusions:   Principal Problem:   Acute on chronic respiratory failure with hypoxemia Active Problems:   OBESITY-MORBID (>100')   Essential hypertension   PAF (paroxysmal atrial fibrillation)   Obstructive sleep apnea   Chronic hypoxemic respiratory failure   CKD (chronic kidney disease) stage 3, GFR 30-59 ml/min   Acute on chronic diastolic heart failure   Hx pulmonary embolism   ILD (interstitial lung disease)   History of DVT (deep vein thrombosis)   Hypoxia   Chronic anticoagulation   Pulmonary edema   Pulmonary fibrosis    Time spent: 20 minutes.    Vernell Leep, MD, FACP, FHM. [facility] Pager [phone]  If 7PM-7AM, please contact night-coverage [email or web address] Password [email or web address] 10/18/2014, 4:35 PM    LOS: 7 days

## 2014-10-18 NOTE — Progress Notes (Signed)
Pt standing weight is 298.1 lbs

## 2014-10-18 NOTE — Progress Notes (Signed)
Patient had one desat episode into the low 70's when she had to bare down to have a BM. This was while wearing a NRB mask because pt desats quickly while eating, drinking and moving.   This am to get pt out of the bed and get a true standing weight, this RN left the Bipap on and the transition for the standing weight and movement to the chair was great. No desaturation this time. Patient then taken off bipap and is sitting comfortably in the recliner on 5LNC. Reduced anxiety.

## 2014-10-18 NOTE — Progress Notes (Signed)
ANTICOAGULATION CONSULT NOTE - Follow Up Consult  Pharmacy Consult for Coumadin Indication: atrial fibrillation  No Known Allergies  Patient Measurements: Height: 5\' 4"  (162.6 cm) Weight: 298 lb 1.6 oz (135.217 kg) IBW/kg (Calculated) : 54.7  Vital Signs: Temp: 97.5 F (36.4 C) (06/07 0508) Temp Source: Axillary (06/07 0508) BP: 138/62 mmHg (06/07 1100) Pulse Rate: 77 (06/07 1100)  Labs:  Recent Labs  10/16/14 0351 10/17/14 0535 10/18/14 0347  HGB  --  13.9  --   HCT  --  44.7  --   PLT  --  182  --   LABPROT 28.4* 23.0* 20.4*  INR 2.72* 2.05* 1.75*  CREATININE 1.34* 1.22* 1.20*    Estimated Creatinine Clearance: 50.4 mL/min (by C-G formula based on Cr of 1.2).  Assessment: 81 y/o female admitted 10/11/2014 with increased dyspnea and weight gain. Patient on chronic Coumadin PTA for h/o afib, DVT & PE in 2007. Had hospitalization for epistaxis in March 2015 with INR of 3.7. INR on admission slightly above goal at 2.3.  INR is down to 1.75 today, just below target range.  Current home dose - 2.5 mg MWFSat, 3.75 mg TTSun.   Goal of Therapy:  Patient specific INR goal of 1.8-2.2 see Dr. Daphine DeutscherBedsole's encounter (08/11/14) Monitor platelets by anticoagulation protocol: Yes   Plan:   Coumadin 3.75 mg today, usual Tuesday dose.  Daily PT/INR for now.  Dennie Fettersgan, Goldman Birchall [place], [place] Pager: [phone] 10/18/2014 11:42 AM

## 2014-10-19 LAB — BASIC METABOLIC PANEL
ANION GAP: 12 (ref 5–15)
BUN: 44 mg/dL — ABNORMAL HIGH (ref 6–20)
CALCIUM: 8.8 mg/dL — AB (ref 8.9–10.3)
CHLORIDE: 94 mmol/L — AB (ref 101–111)
CO2: 34 mmol/L — ABNORMAL HIGH (ref 22–32)
CREATININE: 1.22 mg/dL — AB (ref 0.44–1.00)
GFR calc Af Amer: 47 mL/min — ABNORMAL LOW (ref >60)
GFR calc non Af Amer: 40 mL/min — ABNORMAL LOW (ref >60)
GLUCOSE: 82 mg/dL (ref 65–99)
Potassium: 3.6 mmol/L (ref 3.5–5.1)
Sodium: 140 mmol/L (ref 135–145)

## 2014-10-19 LAB — CBC
HEMATOCRIT: 45.9 % (ref 36.0–46.0)
Hemoglobin: 14.2 g/dL (ref 12.0–15.0)
MCH: 31.2 pg (ref 26.0–34.0)
MCHC: 30.9 g/dL (ref 30.0–36.0)
MCV: 100.9 fL — ABNORMAL HIGH (ref 78.0–100.0)
PLATELETS: 186 10*3/uL (ref 150–400)
RBC: 4.55 MIL/uL (ref 3.87–5.11)
RDW: 19.9 % — AB (ref 11.5–15.5)
WBC: 11.4 10*3/uL — ABNORMAL HIGH (ref 4.0–10.5)

## 2014-10-19 LAB — PROTIME-INR
INR: 1.59 — ABNORMAL HIGH (ref 0.00–1.49)
Prothrombin Time: 19 seconds — ABNORMAL HIGH (ref 11.6–15.2)

## 2014-10-19 MED ORDER — WARFARIN SODIUM 7.5 MG PO TABS
3.7500 mg | ORAL_TABLET | Freq: Once | ORAL | Status: AC
Start: 2014-10-19 — End: 2014-10-19
  Administered 2014-10-19: 3.75 mg via ORAL
  Filled 2014-10-19: qty 2

## 2014-10-19 NOTE — Progress Notes (Signed)
Daily Progress Note   Patient Name: [name] [name]       Date: 10/19/2014 DOB: 12/02/1931  Age: 81 y.o. MRN#: [id number] Attending Physician: Richarda OverlieNayana Abrol, MD Primary Care Physician: [name], MD Admit Date: 10/11/2014  Reason for Consultation/Follow-up: Establishing goals of care  Subjective:     [name] [name] is siting up in recliner reading her book and watching television. She tells me that she is feeling "pretty close" to her baseline prior to admission. She is anticipating going home. She looks forward to seeing her helper's girls play at her home. She continues to joke about wanting another 5 years to live. She states that she would be happy continuing as she is and coming to the hospital 2x year. She will return to the hospital at this point and still desiring further aggressive treatment. However, I feel that with more frequent hospitalizations and decreasing QOL she will consider her options. Support provided - she is fully aware that she has options to focus more on comfort.    Length of Stay: 8 days  Current Medications: Scheduled Meds:  . allopurinol  300 mg Oral Daily  . antiseptic oral rinse  7 mL Mouth Rinse BID  . aspirin EC  81 mg Oral Daily  . atorvastatin  10 mg Oral Daily  . budesonide  0.5 mg Nebulization BID  . diltiazem  240 mg Oral Daily  . escitalopram  20 mg Oral Daily  . famotidine  20 mg Oral Daily  . ferrous sulfate  325 mg Oral BID WC  . furosemide  80 mg Intravenous 3 times per day  . gabapentin  300 mg Oral QHS  . ipratropium-albuterol  3 mL Nebulization QID  . loratadine  10 mg Oral Daily  . polyethylene glycol  17 g Oral Once  . potassium chloride SA  40 mEq Oral Daily  . predniSONE  20 mg Oral Q breakfast  . Warfarin - Pharmacist Dosing Inpatient   Does not apply q1800    Continuous Infusions:    PRN Meds: acetaminophen, albuterol, colchicine  Palliative Performance Scale: 50%     Vital Signs: BP 142/57 mmHg  Pulse 52  Temp(Src)  97.5 F (36.4 C) (Axillary)  Resp 22  Ht 5\' 4"  (1.626 m)  Wt 134.265 kg (296 lb)  BMI 50.78 kg/m2  SpO2 95% SpO2: SpO2: 95 % O2 Device: O2 Device: Nasal Cannula O2 Flow Rate: O2 Flow Rate (L/min): 5 L/min  Intake/output summary:  Intake/Output Summary (Last 24 hours) at 10/19/14 1114 Last data filed at 10/19/14 0815  Gross per 24 hour  Intake    850 ml  Output   2800 ml  Net  -1950 ml   LBM: Last BM Date: 10/17/14 Baseline Weight: Weight: 123.106 kg (271 lb 6.4 oz) Most recent weight: Weight: 134.265 kg (296 lb)  Physical Exam:  General: NAD, sitting in recliner HEENT: [place]/AT, moist mucous membranes CVS: Irreg Resp: No labored breathing, conversing easily without SOB Abd: Soft, obese Neuro: Awake, alert, oriented x 3   Additional Data Reviewed: Recent Labs     10/17/14  0535  10/18/14  0347  10/19/14  0550  WBC  11.0*   --   11.4*  HGB  13.9   --   14.2  PLT  182   --   186  NA  145  144  140  BUN  44*  44*  44*  CREATININE  1.22*  1.20*  1.22*  Problem List:  Patient Active Problem List   Diagnosis Date Noted  . Pulmonary edema   . Pulmonary fibrosis   . Chronic anticoagulation 10/13/2014  . Hypoxia 10/11/2014  . Steroid toxicity 10/05/2014  . Leg swelling 10/04/2014  . AKI (acute kidney injury)   . Acute on chronic respiratory failure 08/02/2014  . History of DVT (deep vein thrombosis) 08/01/2014  . CAP (community acquired pneumonia) 08/01/2014  . SOB (shortness of breath) 08/01/2014  . Interstitial lung disease 08/01/2014  . Acute bronchitis 04/26/2014  . Counseling regarding end of life decision making 04/12/2014  . ILD (interstitial lung disease) 04/05/2014  . Encounter for anticoagulation discussion and counseling 04/05/2014  . Acute on chronic respiratory failure with hypoxemia 02/21/2014  . Non-compliant behavior 02/18/2014  . CHF exacerbation 02/18/2014  . Hx pulmonary embolism 02/18/2014  . Peripheral neuropathy 09/17/2013  .  Acute-on-chronic respiratory failure 08/31/2013  . Acute on chronic diastolic heart failure 08/31/2013  . Hypokalemia 08/24/2013  . Encounter for therapeutic drug monitoring 07/28/2013  . CKD (chronic kidney disease) stage 3, GFR 30-59 ml/min 06/24/2013  . Iron deficiency anemia 06/24/2013  . Chronic hypoxemic respiratory failure 09/29/2012  . Wound of lower extremity due to hematoma 08/16/2012  . Gout, chronic 02/12/2012  . Pure hypercholesterolemia 07/09/2010  . SLEEP RELATED HYPOVENTILATION/HYPOXEMIA CCE 07/03/2010  . OBESITY-MORBID (>100') 04/04/2009  . DIASTOLIC HEART FAILURE, CHRONIC 02/02/2009  . Abdominal aortic aneurysm 12/23/2008  . POSTHERPETIC NEURALGIA 03/09/2008  . Allergic rhinitis 12/02/2007  . DEPRESSION, ACUTE 06/03/2007  . MENINGITIS, HX OF 11/26/2006  . SHINGLES, HX OF 11/26/2006  . Essential hypertension 11/25/2006  . EMBOLISM & INFARCTION, IATROGENIC PULMONARY 11/25/2006  . PAF (paroxysmal atrial fibrillation) 11/25/2006  . THROMBOSIS, VENOUS NOS 11/25/2006  . Obstructive sleep apnea 11/25/2006     Palliative Care Assessment & Plan    Code Status:  Full code  Goals of Care:  Continue aggressive care.   3. Symptom Management:  Constipation: Resolved  Dyspnea: Much improved. Continue management by primary team, cardiology, and pulmonology.   5. Prognosis: Likely < 6 months  5. Discharge Planning: Home with Home Health    Thank you for allowing the Palliative Medicine Team to assist in the care of this patient.   Time In: 1020 Time Out: 1040 Total Time Prolonged Time Billed  no     Greater than 50%  of this time was spent counseling and coordinating care related to the above assessment and plan.     Yong Channel, NP Palliative Medicine Team Pager # [phone] (M-F 8a-5p) Team Phone # [phone] (Nights/Weekends)  10/19/2014, 11:14 AM

## 2014-10-19 NOTE — Progress Notes (Addendum)
PROGRESS NOTE    CINA [name] [name]:[id number] DOB: 07-01-1931 DOA: 10/11/2014 PCP: Eliezer Lofts, MD  HPI/Brief narrative 81 year old female with history of interstitial lung disease, A. fib, HTN, CKD, chronic diastolic CHF, morbid obesity, chronic respiratory failure on oxygen 5 L/m at home, lives with son and ambulates with the help of a walker, admitted on 10/11/14 with worsening dyspnea. She had recently seen her cardiologist who adjusted down her diuretic dose due to renal insufficiency. She has since gained 4 pounds of weight. She was recently seen by pulmonology for second opinion of her ILD and hospice was recommended which she declined. Prednisone was also in increased. In the ED, BNP elevated, chest x-ray showed cardiomegaly with vascular congestion and possible pulmonary edema. Cardiology and pulmonology were consulted-have signed off. Patient is undergoing aggressive IV diuresis and gradually improving. Post discharge, she will have to follow-up with Dr. Lake Bells, pulmonology and Dr. Rockey Situ, cardiology. Hopefully she should be able to discharge in the next 48 hours.   Assessment/Plan:  Acute on chronic hypoxic respiratory failure - Most likely secondary to decompensated CHF complicating underlying COPD and IPF - Treating CHF and COPD aggressively.   Titrate oxygen down to maintain saturations between 89-92 percent. Patient is on BiPAP at home - Palliative care met with patient: Patient wishes to continue full code - Pulmonary signed off - Improved. Now on oxygen 5 L/m via nasal cannula. BiPAP daily at bedtime and when necessary - Gets hypoxic with minimal activity. Try to mobilize with PT Continue prednisone 20 mg a day  Acute on chronic diastolic CHF - Patient started on high-dose IV Lasix with good diuresis and stable renal function  Patient's current weight is higher than admission weight, will continue diuresis with IV Lasix for now - Cardiology signed off - Improving but  remains volume overloaded and hence continue IV Lasix until creatinine starts to rise at which time she can be transitioned to oral Lasix at discharge.   Paroxysmal A. Fib - Sinus rhythm on monitor. Continue diltiazem and Coumadin per pharmacy  Acute on stage III chronic kidney disease  - Monitor creatinine closely while on diuretics  - Creatinine stable.  Hypokalemia Resolved  Failure to thrive - Multifactorial due to complex multiple medical conditions. Discussed at length with her. She wishes to remain full code but is agreeable to palliative care consultation for goals of care.  Hyperglycemia - Related to steroids. SSI-patient refuses and hence discontinued. DC CBG checks - Check A1c: 6  GERD  - On H2 blocker   HLD - Continue statins  History of PE - Anticoagulated  DVT prophylaxis: Anticoagulated on Coumadin.  Code Status: Full Family Communication: None at bedside. Unsuccessfully attempted to reach patient's son Mr. Shaneka Efaw (as per patient request) on 6/5. Disposition Plan: DC home possibly in the next 48 hours.   Consultants:  Cardiology-signed off  PCCM-signed off  Palliative care medicine  Procedures:  BIPAP PRN  Foley catheter-continue while on IV Lasix  Antibiotics:  None   Subjective: States that dyspnea and leg swelling gradually continued to improve.  still gets easily hypoxic with minimal activity.  Denies any subjective shortness of breath at rest  Objective: Filed Vitals:   10/19/14 0321 10/19/14 0500 10/19/14 0600 10/19/14 0845  BP:  142/57 142/57   Pulse: 59 63 52   Temp:  97.5 F (36.4 C)    TempSrc:  Axillary    Resp: 20 22    Height:      Weight:  134.265 kg (296 lb)    SpO2: 97% 97% 99% 95%    Intake/Output Summary (Last 24 hours) at 10/19/14 1016 Last data filed at 10/19/14 0815  Gross per 24 hour  Intake    850 ml  Output   3000 ml  Net  -2150 ml   Filed Weights   10/18/14 0508 10/18/14 0653 10/19/14 0500    Weight: 130.817 kg (288 lb 6.4 oz) 135.217 kg (298 lb 1.6 oz) 134.265 kg (296 lb)     Exam:  General exam: Morbidly obese sitting up comfortably on chair this morning.  Respiratory system: improved breath sounds. No wheezing or rhonchi. Few basal crackles. No increased work of breathing. Cardiovascular system: S1 & S2 heard, RRR. No JVD, murmurs, gallops, clicks. Trace pitting bilateral leg edema- decreasing. Gastrointestinal system: Abdomen is nondistended, soft and nontender. Normal bowel sounds heard. Central nervous system: Alert and oriented. No focal neurological deficits. Extremities: Symmetric 5 x 5 power.   Data Reviewed: Basic Metabolic Panel:  Recent Labs Lab 10/15/14 0249 10/16/14 0351 10/17/14 0535 10/18/14 0347 10/19/14 0550  NA 142 144 145 144 140  K 3.9 3.6 3.7 3.2* 3.6  CL 95* 96* 97* 97* 94*  CO2 36* 36* 36* 37* 34*  GLUCOSE 126* 99 90 110* 82  BUN 50* 50* 44* 44* 44*  CREATININE 1.20* 1.34* 1.22* 1.20* 1.22*  CALCIUM 8.7* 8.8* 8.8* 8.9 8.8*   Liver Function Tests: No results for input(s): AST, ALT, ALKPHOS, BILITOT, PROT, ALBUMIN in the last 168 hours. No results for input(s): LIPASE, AMYLASE in the last 168 hours. No results for input(s): AMMONIA in the last 168 hours. CBC:  Recent Labs Lab 10/13/14 0415 10/17/14 0535 10/19/14 0550  WBC 12.4* 11.0* 11.4*  HGB 13.8 13.9 14.2  HCT 44.7 44.7 45.9  MCV 102.8* 102.1* 100.9*  PLT 201 182 186   Cardiac Enzymes: No results for input(s): CKTOTAL, CKMB, CKMBINDEX, TROPONINI in the last 168 hours. BNP (last 3 results)  Recent Labs  02/22/14 0947 02/24/14 0332 03/07/14 0648  PROBNP 2319.0* 2410.0* 437.5   CBG:  Recent Labs Lab 10/13/14 1715 10/13/14 2133 10/14/14 0758 10/14/14 1158 10/15/14 0755  GLUCAP 135* 167* 156* 126* 110*    Recent Results (from the past 240 hour(s))  MRSA PCR Screening     Status: Abnormal   Collection Time: 10/11/14  6:49 PM  Result Value Ref Range Status    MRSA by PCR POSITIVE (A) NEGATIVE Final    Comment:        The GeneXpert MRSA Assay (FDA approved for NASAL specimens only), is one component of a comprehensive MRSA colonization surveillance program. It is not intended to diagnose MRSA infection nor to guide or monitor treatment for MRSA infections. RESULT CALLED TO, READ BACK BY AND VERIFIED WITH: RALPH,C RN 0031 10/12/14 MITCHELL,L          Studies: No results found.      Scheduled Meds: . allopurinol  300 mg Oral Daily  . antiseptic oral rinse  7 mL Mouth Rinse BID  . aspirin EC  81 mg Oral Daily  . atorvastatin  10 mg Oral Daily  . budesonide  0.5 mg Nebulization BID  . diltiazem  240 mg Oral Daily  . escitalopram  20 mg Oral Daily  . famotidine  20 mg Oral Daily  . ferrous sulfate  325 mg Oral BID WC  . furosemide  80 mg Intravenous 3 times per day  . gabapentin  300 mg Oral  QHS  . ipratropium-albuterol  3 mL Nebulization QID  . loratadine  10 mg Oral Daily  . polyethylene glycol  17 g Oral Once  . potassium chloride SA  40 mEq Oral Daily  . predniSONE  20 mg Oral Q breakfast  . Warfarin - Pharmacist Dosing Inpatient   Does not apply q1800   Continuous Infusions:   Principal Problem:   Acute on chronic respiratory failure with hypoxemia Active Problems:   OBESITY-MORBID (>100')   Essential hypertension   PAF (paroxysmal atrial fibrillation)   Obstructive sleep apnea   Chronic hypoxemic respiratory failure   CKD (chronic kidney disease) stage 3, GFR 30-59 ml/min   Acute on chronic diastolic heart failure   Hx pulmonary embolism   ILD (interstitial lung disease)   History of DVT (deep vein thrombosis)   Hypoxia   Chronic anticoagulation   Pulmonary edema   Pulmonary fibrosis    Time spent: 20 minutes.    Reyne Dumas, MD,   [facility] Pager [phone]  If 7PM-7AM, please contact night-coverage [email or web address] Password [email or web address] 10/19/2014, 10:16 AM    LOS: 8 days

## 2014-10-19 NOTE — Progress Notes (Signed)
Physical Therapy Treatment Patient Details Name: [name] [name] MRN: [id number] DOB: 22-May-1931 Today's Date: 10/19/2014    History of Present Illness pt presents with Respiratory Failure.  pt with long hx of Respiratory deficits and on Home O2.  PMH: CHF, Interstitial Lung Disease, A-fib, Gout, PE, and DVT.      PT Comments    Pt progressing towards physical therapy goals. Pt appears motivated to work with therapy and states "I'm not going to get better just sitting here - when are you coming back?". Pt reports that she is near her baseline of function with sats dropping into the 70's during longer bouts of ambulation on 6L/min supplemental O2. This session she was able to walk about 52' with RW and 2 seated rest breaks (~3-5 minute seated rest required between each bout of walking).  It appears she has good support at home. Will continue to follow.   Follow Up Recommendations  Home health PT;Supervision/Assistance - 24 hour     Equipment Recommendations  Wheelchair (measurements PT);Wheelchair cushion (measurements PT)    Recommendations for Other Services       Precautions / Restrictions Precautions Precautions: Fall Precaution Comments: Baseline: O2 at 6L/min and sats dropping into the 70's with home mobility.  Restrictions Weight Bearing Restrictions: No    Mobility  Bed Mobility               General bed mobility comments: Pt sitting up in recliner upon PT arrival.   Transfers Overall transfer level: Needs assistance Equipment used: Rolling walker (2 wheeled) Transfers: Sit to/from Stand Sit to Stand: Mod assist;+2 physical assistance         General transfer comment: Pt was able to power-up to full stand with rocking for momemtum and +2 assist for initial support.   Ambulation/Gait Ambulation/Gait assistance: Min guard Ambulation Distance (Feet): 75 Feet Assistive device: Rolling walker (2 wheeled) Gait Pattern/deviations: Step-through pattern;Decreased  stride length;Trunk flexed Gait velocity: Decreased Gait velocity interpretation: Below normal speed for age/gender General Gait Details: No physical assist required, however pt required close monitoring of O2 sats and chair follow for seated rest breaks. Pt ambulated on 6L/min supplemental O2 with sats decreasing into low 80's during ambulation and then further as low as 72% after seated rest break. Pt demonstrating good pursed-lip breathing throughout session.    Stairs            Wheelchair Mobility    Modified Rankin (Stroke Patients Only)       Balance Overall balance assessment: Needs assistance Sitting-balance support: Feet supported Sitting balance-Leahy Scale: Fair     Standing balance support: Bilateral upper extremity supported Standing balance-Leahy Scale: Poor Standing balance comment: Pt requires UE support to maintain standing balance. Assist required for support as pt transitions hands from armrests of chair to walker during sit>stand.                     Cognition Arousal/Alertness: Awake/alert Behavior During Therapy: WFL for tasks assessed/performed Overall Cognitive Status: Within Functional Limits for tasks assessed                      Exercises      General Comments        Pertinent Vitals/Pain Pain Assessment: No/denies pain    Home Living                      Prior Function  PT Goals (current goals can now be found in the care plan section) Acute Rehab PT Goals Patient Stated Goal: Home PT Goal Formulation: With patient Time For Goal Achievement: 10/31/14 Potential to Achieve Goals: Fair Progress towards PT goals: Progressing toward goals    Frequency  Min 3X/week    PT Plan Current plan remains appropriate    Co-evaluation             End of Session Equipment Utilized During Treatment: Gait belt;Oxygen Activity Tolerance: Patient limited by fatigue (O2 sats) Patient left: in  chair;with call bell/phone within reach     Time: [id number] PT Time Calculation (min) (ACUTE ONLY): 24 min  Charges:  $Gait Training: 8-22 mins $Therapeutic Activity: 8-22 mins                    G Codes:      Conni SlipperKirkman, Imara Standiford 10/19/2014, 12:08 PM  Conni SlipperLaura Kealan Buchan, PT, DPT Acute Rehabilitation Services Pager: [phone]

## 2014-10-19 NOTE — Progress Notes (Signed)
Placed pt on nocturnal bipap per home regimen.  RN aware.

## 2014-10-19 NOTE — Progress Notes (Signed)
ANTICOAGULATION CONSULT NOTE - Follow Up Consult  Pharmacy Consult for Coumadin Indication: atrial fibrillation  No Known Allergies  Patient Measurements: Height: 5\' 4"  (162.6 cm) Weight: 296 lb (134.265 kg) IBW/kg (Calculated) : 54.7  Vital Signs: Temp: 97.5 F (36.4 C) (06/08 0500) Temp Source: Axillary (06/08 0500) BP: 142/57 mmHg (06/08 0600) Pulse Rate: 52 (06/08 0600)  Labs:  Recent Labs  10/17/14 0535 10/18/14 0347 10/19/14 0550  HGB 13.9  --  14.2  HCT 44.7  --  45.9  PLT 182  --  186  LABPROT 23.0* 20.4* 19.0*  INR 2.05* 1.75* 1.59*  CREATININE 1.22* 1.20* 1.22*    Estimated Creatinine Clearance: 49.4 mL/min (by C-G formula based on Cr of 1.22).  Assessment: 81 y/o female admitted 10/11/2014 with increased dyspnea and weight gain. Patient on chronic Coumadin PTA for h/o afib, DVT & PE in 2007. Had hospitalization for epistaxis in March 2015 with INR of 3.7. INR on admission slightly above goal at 2.3.  INR is down to 1.59 today, subtherapeutic.  Current home dose:  2.5 mg MWFSat, 3.75 mg TTSun.   Goal of Therapy:  Patient specific INR goal of 1.8-2.2 see Dr. Daphine DeutscherBedsole's encounter (08/11/14) Monitor platelets by anticoagulation protocol: Yes   Plan:   Coumadin 3.75 mg again today, increase from usual Wednesday dose.  Daily PT/INR for now.  Dennie Fettersgan, Ogle Hoeffner Donovan, [place] Pager: [phone] 10/19/2014 12:29 PM

## 2014-10-20 LAB — CBC
HEMATOCRIT: 44.5 % (ref 36.0–46.0)
HEMOGLOBIN: 14.1 g/dL (ref 12.0–15.0)
MCH: 31.6 pg (ref 26.0–34.0)
MCHC: 31.7 g/dL (ref 30.0–36.0)
MCV: 99.8 fL (ref 78.0–100.0)
Platelets: 187 10*3/uL (ref 150–400)
RBC: 4.46 MIL/uL (ref 3.87–5.11)
RDW: 19.3 % — ABNORMAL HIGH (ref 11.5–15.5)
WBC: 12.1 10*3/uL — AB (ref 4.0–10.5)

## 2014-10-20 LAB — COMPREHENSIVE METABOLIC PANEL
ALK PHOS: 41 U/L (ref 38–126)
ALT: 32 U/L (ref 14–54)
ANION GAP: 10 (ref 5–15)
AST: 38 U/L (ref 15–41)
Albumin: 3.2 g/dL — ABNORMAL LOW (ref 3.5–5.0)
BILIRUBIN TOTAL: 1.5 mg/dL — AB (ref 0.3–1.2)
BUN: 37 mg/dL — AB (ref 6–20)
CALCIUM: 8.9 mg/dL (ref 8.9–10.3)
CHLORIDE: 96 mmol/L — AB (ref 101–111)
CO2: 35 mmol/L — ABNORMAL HIGH (ref 22–32)
Creatinine, Ser: 1.21 mg/dL — ABNORMAL HIGH (ref 0.44–1.00)
GFR calc Af Amer: 47 mL/min — ABNORMAL LOW (ref >60)
GFR, EST NON AFRICAN AMERICAN: 41 mL/min — AB (ref >60)
Glucose, Bld: 96 mg/dL (ref 65–99)
Potassium: 3.6 mmol/L (ref 3.5–5.1)
Sodium: 141 mmol/L (ref 135–145)
TOTAL PROTEIN: 5.4 g/dL — AB (ref 6.5–8.1)

## 2014-10-20 LAB — PROTIME-INR
INR: 1.58 — AB (ref 0.00–1.49)
Prothrombin Time: 18.9 seconds — ABNORMAL HIGH (ref 11.6–15.2)

## 2014-10-20 MED ORDER — WARFARIN SODIUM 7.5 MG PO TABS
3.7500 mg | ORAL_TABLET | Freq: Once | ORAL | Status: AC
Start: 2014-10-20 — End: 2014-10-20
  Administered 2014-10-20: 3.75 mg via ORAL
  Filled 2014-10-20: qty 2

## 2014-10-20 MED ORDER — FUROSEMIDE 80 MG PO TABS
80.0000 mg | ORAL_TABLET | Freq: Three times a day (TID) | ORAL | Status: DC
  Administered 2014-10-20: 80 mg via ORAL
  Filled 2014-10-20: qty 1

## 2014-10-20 MED ORDER — POLYETHYLENE GLYCOL 3350 17 G PO PACK: 17.0000 g | PACK | Freq: Once | ORAL | Status: DC

## 2014-10-20 MED ORDER — ALBUTEROL SULFATE (2.5 MG/3ML) 0.083% IN NEBU: 2.5000 mg | INHALATION_SOLUTION | RESPIRATORY_TRACT | Status: AC | PRN

## 2014-10-20 MED ORDER — FUROSEMIDE 80 MG PO TABS: 80.0000 mg | ORAL_TABLET | Freq: Three times a day (TID) | ORAL | Status: DC

## 2014-10-20 MED ORDER — NYSTATIN 100000 UNIT/GM EX POWD: CUTANEOUS | Status: DC

## 2014-10-20 NOTE — Consult Note (Signed)
   [facility] CM Inpatient Consult   10/20/2014  [name] [name] 1932/02/26 [id number] Referral received to assess for care management services. Explained that [name] is a covered benefit of Medicare insurance.   Met with the patient regarding the benefits of [name] services.  Patient states she has been active with [name] for the past 2-3 years and receives nursing for various services.  She states has some private personal care services that provides light house keeping, meal preparation, and shopping.  She states she has no trouble getting to her MD appointments or obtaining her medications. She states she is adherent to her daily weights and follow up with her primary MD, Dr. Josefa Half. "I pretty much have everything I need so far." Reviewed information for [name] and a folder was provided with contact information. Brochure and contact information given.  Explained that [name] does not interfere with or replace any services arranged by the inpatient care management staff. Patient verbalized understanding.   Patient declined services with [name], at this time.  For questions, please contact: Natividad Brood, RN BSN [facility] Liaison  [phone] business mobile phone

## 2014-10-20 NOTE — Progress Notes (Signed)
Pt understands discharge instructions. Pt discharged home with daughter and son. VSS

## 2014-10-20 NOTE — Progress Notes (Signed)
Pt walked this AM, on 6L 02. Pt did well. Pt desat to 82% MD was notified.

## 2014-10-20 NOTE — Care Management Note (Signed)
Case Management Note  Patient Details  Name: [name] [name] MRN: [id number] Date of Birth: Sep 16, 1931  Subjective/Objective:          Pt plan for d/c today home with [facility] services.           Action/Plan: CM did make referral for new services with [name]. SOC to begin within 24-48 hrs post d/c. [facility] to consult to see if pt is eligible for services. No further needs from CM at this time.   Expected Discharge Date:                  Expected Discharge Plan:  Home w Home Health Services  In-House Referral:     Discharge planning Services  CM Consult  Post Acute Care Choice:  Home Health, Resumption of Svcs/PTA Provider Choice offered to:  Patient  DME Arranged:    DME Agency:     HH Arranged:  RN, Disease Management, PT, OT, Nurse's Aide HH Agency:  [facility]  Status of Service:  In process, will continue to follow  Medicare Important Message Given:  Yes Date Medicare IM Given:  10/17/14 Medicare IM give by:  Tomi Bamberger, RN,BSN Date Additional Medicare IM Given:  10/20/14 Additional Medicare Important Message give by:  Tomi Bamberger  If discussed at Long Length of Stay Meetings, dates discussed:    Additional Comments:  Gala Lewandowsky, RN 10/20/2014, 11:39 AM

## 2014-10-20 NOTE — Progress Notes (Signed)
ANTICOAGULATION CONSULT NOTE - Follow Up Consult  Pharmacy Consult for Coumadin Indication: atrial fibrillation  No Known Allergies  Patient Measurements: Height: 5\' 4"  (162.6 cm) Weight: 294 lb (133.358 kg) IBW/kg (Calculated) : 54.7  Vital Signs: Temp: 97.5 F (36.4 C) (06/09 0600) Temp Source: Axillary (06/09 0600) BP: 140/65 mmHg (06/09 0600) Pulse Rate: 76 (06/08 2346)  Labs:  Recent Labs  10/18/14 0347 10/19/14 0550 10/20/14 0431 10/20/14 0826  HGB  --  14.2 14.1  --   HCT  --  45.9 44.5  --   PLT  --  186 187  --   LABPROT 20.4* 19.0* 18.9*  --   INR 1.75* 1.59* 1.58*  --   CREATININE 1.20* 1.22*  --  1.21*    Estimated Creatinine Clearance: 49.6 mL/min (by C-G formula based on Cr of 1.21).  Assessment: 81 y/o female admitted 10/11/2014 with increased dyspnea and weight gain. Patient on chronic Coumadin PTA for h/o afib, DVT & PE in 2007. Had hospitalization for epistaxis in March 2015 with INR of 3.7. INR on admission slightly above goal at 2.3.  INR is down to 1.58 today, subtherapeutic.  Current home dose:  2.5 mg MWFSat, 3.75 mg TTSun.   Goal of Therapy:  Patient specific INR goal of 1.8-2.2 see Dr. Daphine Deutscher encounter (08/11/14) Monitor platelets by anticoagulation protocol: Yes   Plan:  - Repeat Coumadin 3.75 mg again today, increase from usual Wednesday dose. When ready for discharge, continue home dose  - Daily PT/INR for now.  Vinnie Level, PharmD., BCPS Clinical Pharmacist Pager [phone]

## 2014-10-20 NOTE — Discharge Summary (Addendum)
Physician Discharge Summary  [name] [name] MRN: [id number] DOB/AGE: 11/03/1931 81 y.o.  PCP: Eliezer Lofts, MD   Admit date: 10/11/2014 Discharge date: 10/20/2014  Discharge Diagnoses:   Principal Problem:   Acute on chronic respiratory failure with hypoxemia Active Problems:   OBESITY-MORBID (>100')   Essential hypertension   PAF (paroxysmal atrial fibrillation)   Obstructive sleep apnea   Chronic hypoxemic respiratory failure   CKD (chronic kidney disease) stage 3, GFR 30-59 ml/min   Acute on chronic diastolic heart failure   Hx pulmonary embolism   ILD (interstitial lung disease)   History of DVT (deep vein thrombosis)   Hypoxia   Chronic anticoagulation   Pulmonary edema   Pulmonary fibrosis     Follow-up recommendations Follow-up with PCP in 3-5 days Follow-up CBC, CMP, INR in 3-5 days     Medication List    STOP taking these medications        torsemide 20 MG tablet  Commonly known as:  DEMADEX      TAKE these medications        albuterol (2.5 MG/3ML) 0.083% nebulizer solution  Commonly known as:  PROVENTIL  Take 3 mLs (2.5 mg total) by nebulization every 2 (two) hours as needed for wheezing or shortness of breath.     allopurinol 300 MG tablet  Commonly known as:  ZYLOPRIM  Take 1 tablet (300 mg total) by mouth daily.     aspirin 81 MG tablet  Take 81 mg by mouth daily.     atorvastatin 10 MG tablet  Commonly known as:  LIPITOR  Take 1 tablet (10 mg total) by mouth daily.     budesonide 0.25 MG/2ML nebulizer solution  Commonly known as:  PULMICORT  Take 0.25 mg by nebulization 2 (two) times daily.     colchicine 0.6 MG tablet  Take 0.6 mg by mouth as needed (for flareup).     diltiazem 240 MG 24 hr capsule  Commonly known as:  CARDIZEM CD  Take 1 capsule (240 mg total) by mouth daily.     escitalopram 20 MG tablet  Commonly known as:  LEXAPRO  Take 1 tablet (20 mg total) by mouth daily.     famotidine 20 MG tablet  Commonly known as:   PEPCID  Take 1 tablet (20 mg total) by mouth daily.     ferrous sulfate 325 (65 FE) MG tablet  Take 1 tablet (325 mg total) by mouth 2 (two) times daily with a meal.     furosemide 80 MG tablet  Commonly known as:  LASIX  Take 1 tablet (80 mg total) by mouth every 8 (eight) hours.     gabapentin 300 MG capsule  Commonly known as:  NEURONTIN  Take 1 capsule (300 mg total) by mouth daily.     metolazone 5 MG tablet  Commonly known as:  ZAROXOLYN  Take 1 tablet (5 mg total) by mouth daily as needed.     OVER THE COUNTER MEDICATION  Inhale 1 application into the lungs at bedtime. USES BIPAP EVERY BEDTIME FOR SLEEP     polyethylene glycol packet  Commonly known as:  MIRALAX / GLYCOLAX  Take 17 g by mouth once.     potassium chloride SA 20 MEQ tablet  Commonly known as:  K-DUR,KLOR-CON  Take 20 mEq by mouth daily.     predniSONE 5 MG tablet  Commonly known as:  DELTASONE  Take 20 mg by mouth daily.     warfarin 2.5  MG tablet  Commonly known as:  COUMADIN  Take as directed.     ZYRTEC ALLERGY 10 MG tablet  Generic drug:  cetirizine  Take 10 mg by mouth as needed for allergies.         Discharge Condition: Poor prognosis   Disposition: Home with family and home health   Consults:  Palliative care    Significant Diagnostic Studies:  Dg Chest Port 1 View  10/15/2014   CLINICAL DATA:  Respiratory failure, follow-up exam.  EXAM: PORTABLE CHEST - 1 VIEW  COMPARISON:  10/13/2014  FINDINGS: Mild enlargement of the cardiopericardial silhouette. No mediastinal or hilar masses.  Lung volumes remain low. There is vascular congestion and mild interstitial prominence. Some hazy opacity noted on the previous exam has improved. No new lung opacities. Probable small effusions.  IMPRESSION: 1. Mild improvement in lung aeration since the prior exam. 2. There still vascular congestion with mild interstitial prominence and mild cardiomegaly. Combination suggests mild congestive heart  failure.   Electronically Signed   By: Lajean Manes M.D.   On: 10/15/2014 07:52   Dg Chest Portable 1 View  10/13/2014   CLINICAL DATA:  81 year old female with a history of pulmonary edema  EXAM: PORTABLE CHEST - 1 VIEW  COMPARISON:  Multiple prior plain film, most recent 10/12/2014. CT 08/03/2014  FINDINGS: Cardiomediastinal silhouette is unchanged with cardiomegaly. Borders are partially obscured by overlying lung and pleural disease.  Lung volumes remain low with interstitial and airspace opacities. Hemidiaphragms are obscured.  Unremarkable appearance of the visualized skeletal structures.  IMPRESSION: Low lung volumes with worsening edema and likely small bilateral pleural effusions.  Signed,  Dulcy Fanny. Earleen Newport, DO  [facility]  [facility]   Electronically Signed   By: Corrie Mckusick D.O.   On: 10/13/2014 07:51   Dg Chest Port 1 View  10/12/2014   CLINICAL DATA:  Worsening shortness of breath and decreased O2 saturation. Personal history of pulmonary edema. Initial encounter.  EXAM: PORTABLE CHEST - 1 VIEW  COMPARISON:  Chest radiograph performed 10/11/2014  FINDINGS: The right costophrenic angle is incompletely imaged on this study. Small bilateral pleural effusions are seen. Underlying vascular congestion and diffusely increased interstitial markings are compatible with pulmonary edema, similar in appearance to the prior study. No pneumothorax is seen.  The cardiomediastinal silhouette is mildly enlarged. No acute osseous abnormalities are identified.  IMPRESSION: Small bilateral pleural effusions noted. Underlying vascular congestion and mild cardiomegaly, with diffusely increased interstitial markings, compatible with mild persistent pulmonary edema.   Electronically Signed   By: Garald Balding M.D.   On: 10/12/2014 03:33   Dg Chest Port 1 View  10/11/2014   CLINICAL DATA:  Shortness of breath for 4 days, history of unspecified CHF, chronic atrial  fibrillation, DVT, hypertension, hyperlipidemia, pulmonary embolism, coronary artery disease, abdominal aortic aneurysm, interstitial lung disease  EXAM: PORTABLE CHEST - 1 VIEW  COMPARISON:  Portable exam 1440 hours compared to 08/01/2014  FINDINGS: Enlargement of cardiac silhouette with pulmonary vascular congestion.  Chronic interstitial changes diffusely which could represent CHF though underlying chronic interstitial disease not excluded.  Pleural thickening at the apices unchanged.  No segmental consolidation, pleural effusion or pneumothorax.  Bones demineralized.  IMPRESSION: Enlargement of cardiac silhouette with vascular congestion and question mild pulmonary edema though cannot exclude underlying chronic interstitial lung disease.   Electronically Signed   By: Lavonia Dana M.D.   On: 10/11/2014 14:55     2-D echo  Filed Weights   10/18/14 0653 10/19/14 0500 10/20/14 4166  Weight: 135.217 kg (298 lb 1.6 oz) 134.265 kg (296 lb) 133.358 kg (294 lb)     Microbiology: Recent Results (from the past 240 hour(s))  MRSA PCR Screening     Status: Abnormal   Collection Time: 10/11/14  6:49 PM  Result Value Ref Range Status   MRSA by PCR POSITIVE (A) NEGATIVE Final    Comment:        The GeneXpert MRSA Assay (FDA approved for NASAL specimens only), is one component of a comprehensive MRSA colonization surveillance program. It is not intended to diagnose MRSA infection nor to guide or monitor treatment for MRSA infections. RESULT CALLED TO, READ BACK BY AND VERIFIED WITH: RALPH,C RN [id number] 10/12/14 MITCHELL,L        Blood Culture    Component Value Date/Time   SDES SPUTUM 08/03/2014 0857   SPECREQUEST NONE 08/03/2014 0857   CULT  08/01/2014 2317    NO GROWTH 5 DAYS Performed at [place] 08/03/2014 FINAL 08/03/2014 0857      Labs: Results for orders placed or performed during the hospital encounter of 10/11/14 (from the past 48 hour(s))   Protime-INR     Status: Abnormal   Collection Time: 10/19/14  5:50 AM  Result Value Ref Range   Prothrombin Time 19.0 (H) 11.6 - 15.2 seconds   INR 1.59 (H) 0.00 - 0.63  Basic metabolic panel     Status: Abnormal   Collection Time: 10/19/14  5:50 AM  Result Value Ref Range   Sodium 140 135 - 145 mmol/L   Potassium 3.6 3.5 - 5.1 mmol/L   Chloride 94 (L) 101 - 111 mmol/L   CO2 34 (H) 22 - 32 mmol/L   Glucose, Bld 82 65 - 99 mg/dL   BUN 44 (H) 6 - 20 mg/dL   Creatinine, Ser 1.22 (H) 0.44 - 1.00 mg/dL   Calcium 8.8 (L) 8.9 - 10.3 mg/dL   GFR calc non Af Amer 40 (L) >60 mL/min   GFR calc Af Amer 47 (L) >60 mL/min    Comment: (NOTE) The eGFR has been calculated using the CKD EPI equation. This calculation has not been validated in all clinical situations. eGFR's persistently <60 mL/min signify possible Chronic Kidney Disease.    Anion gap 12 5 - 15  CBC     Status: Abnormal   Collection Time: 10/19/14  5:50 AM  Result Value Ref Range   WBC 11.4 (H) 4.0 - 10.5 K/uL   RBC 4.55 3.87 - 5.11 MIL/uL   Hemoglobin 14.2 12.0 - 15.0 g/dL   HCT 45.9 36.0 - 46.0 %   MCV 100.9 (H) 78.0 - 100.0 fL   MCH 31.2 26.0 - 34.0 pg   MCHC 30.9 30.0 - 36.0 g/dL   RDW 19.9 (H) 11.5 - 15.5 %   Platelets 186 150 - 400 K/uL  Protime-INR     Status: Abnormal   Collection Time: 10/20/14  4:31 AM  Result Value Ref Range   Prothrombin Time 18.9 (H) 11.6 - 15.2 seconds   INR 1.58 (H) 0.00 - 1.49  CBC     Status: Abnormal   Collection Time: 10/20/14  4:31 AM  Result Value Ref Range   WBC 12.1 (H) 4.0 - 10.5 K/uL   RBC 4.46 3.87 - 5.11 MIL/uL   Hemoglobin 14.1 12.0 - 15.0 g/dL   HCT 44.5 36.0 - 46.0 %   MCV 99.8  78.0 - 100.0 fL   MCH 31.6 26.0 - 34.0 pg   MCHC 31.7 30.0 - 36.0 g/dL   RDW 19.3 (H) 11.5 - 15.5 %   Platelets 187 150 - 400 K/uL  Comprehensive metabolic panel     Status: Abnormal   Collection Time: 10/20/14  8:26 AM  Result Value Ref Range   Sodium 141 135 - 145 mmol/L   Potassium 3.6  3.5 - 5.1 mmol/L    Comment: HEMOLYSIS AT THIS LEVEL MAY AFFECT RESULT   Chloride 96 (L) 101 - 111 mmol/L   CO2 35 (H) 22 - 32 mmol/L   Glucose, Bld 96 65 - 99 mg/dL   BUN 37 (H) 6 - 20 mg/dL   Creatinine, Ser 1.21 (H) 0.44 - 1.00 mg/dL   Calcium 8.9 8.9 - 10.3 mg/dL   Total Protein 5.4 (L) 6.5 - 8.1 g/dL   Albumin 3.2 (L) 3.5 - 5.0 g/dL   AST 38 15 - 41 U/L   ALT 32 14 - 54 U/L   Alkaline Phosphatase 41 38 - 126 U/L   Total Bilirubin 1.5 (H) 0.3 - 1.2 mg/dL   GFR calc non Af Amer 41 (L) >60 mL/min   GFR calc Af Amer 47 (L) >60 mL/min    Comment: (NOTE) The eGFR has been calculated using the CKD EPI equation. This calculation has not been validated in all clinical situations. eGFR's persistently <60 mL/min signify possible Chronic Kidney Disease.    Anion gap 10 5 - 15     Lipid Panel     Component Value Date/Time   CHOL 115 03/03/2014 0820   TRIG 110 03/03/2014 0820   HDL 49 03/03/2014 0820   CHOLHDL 2.3 03/03/2014 0820   VLDL 22 03/03/2014 0820   LDLCALC 44 03/03/2014 0820     Lab Results  Component Value Date   HGBA1C 6.0* 10/13/2014   HGBA1C 6.2* 03/03/2014   HGBA1C 5.6 08/31/2013     Lab Results  Component Value Date   LDLCALC 44 03/03/2014   CREATININE 1.21* 10/20/2014    HPI/Brief narrative 81 year old female with history of interstitial lung disease, A. fib, HTN, CKD, chronic diastolic CHF, morbid obesity, chronic respiratory failure on oxygen 5 L/m at home, lives with son and ambulates with the help of a walker, admitted on 10/11/14 with worsening dyspnea. She had recently seen her cardiologist who adjusted down her diuretic dose due to renal insufficiency. She has since gained 4 pounds of weight. She was recently seen by pulmonology for second opinion of her ILD and hospice was recommended which she declined. Prednisone was also in increased. In the ED, BNP elevated, chest x-ray showed cardiomegaly with vascular congestion and possible pulmonary edema.  Cardiology and pulmonology were consulted-have signed off. Patient is undergoing aggressive IV diuresis and gradually improving. Post discharge, she will have to follow-up with Dr. Lake Bells, pulmonology and Dr. Rockey Situ, cardiology. Hopefully she should be able to discharge in the next 48 hours.   Assessment/Plan:  Acute on chronic hypoxic respiratory failure - Most likely secondary to decompensated CHF complicating underlying COPD and IPF - Treating CHF and COPD aggressively.  Titrate oxygen down to maintain saturations between 89-92 percent. Patient is on BiPAP at home - Palliative care met with patient: Patient wishes to continue full code - Pulmonary signed off - Improved. Now on oxygen 5 L/m via nasal cannula. BiPAP daily at bedtime and when necessary - Gets hypoxic with minimal activity. Try to mobilize with PT Patient to  continue   on prednisone 20 mg daily until seen in follow up with Dr Lake Bells (appt has already been made)p  Acute on chronic diastolic CHF - Patient started on high-dose IV Lasix with good diuresis and stable renal function Patient's current weight is higher than admission weight, patient treated with IV Lasix for 9 days No significant change in weight per Epic, however patient swelling in her upper and lower extremities seems to have improved - Cardiology signed off Continue Lasix 80 mg by mouth 3 times a day along with Zaroxolyn Patient would need close follow-up of her kidney function outpatient  Paroxysmal A. Fib - Sinus rhythm on monitor. Continue diltiazem and Coumadin per pharmacy  Acute on stage III chronic kidney disease  - Monitor creatinine closely while on diuretics  - Creatinine stable.  Hypokalemia Resolved  Failure to thrive - Multifactorial due to complex multiple medical conditions. Discussed at length with her. She wishes to remain full code but is agreeable to palliative care consultation for goals of care.  Hyperglycemia - Related to  steroids. SSI-patient refuses and hence discontinued. DC CBG checks - Check A1c: 6  GERD  - On H2 blocker   HLD - Continue statins  History of PE - Anticoagulated     Discharge Exam: *   Blood pressure 140/65, pulse 76, temperature 97.5 F (36.4 C), temperature source Axillary, resp. rate 24, height $RemoveBe'5\' 4"'YwMjixFbw$  (1.626 m), weight 133.358 kg (294 lb), SpO2 94 %. General exam: Morbidly obese sitting up comfortably on chair this morning.  Respiratory system: improved breath sounds. No wheezing or rhonchi. Few basal crackles. No increased work of breathing. Cardiovascular system: S1 & S2 heard, RRR. No JVD, murmurs, gallops, clicks. Trace pitting bilateral leg edema- decreasing. Gastrointestinal system: Abdomen is nondistended, soft and nontender. Normal bowel sounds heard. Central nervous system: Alert and oriented. No focal neurological deficits. Extremities: Symmetric 5 x 5 power.        Discharge Instructions    Diet - low sodium heart healthy    Complete by:  As directed      Increase activity slowly    Complete by:  As directed            Follow-up Information    Follow up with Ida Rogue, MD On 11/04/2014.   Specialty:  Cardiology   Why:  9:15   Contact information:   [place] [address] [phone]       Signed: Reyne Dumas 10/20/2014, 11:21 AM        Time spent >45 mins

## 2014-10-20 NOTE — Progress Notes (Signed)
OT Cancellation Note  Patient Details Name: [name] [name] MRN: [id number] DOB: Jun 19, 1931   Cancelled Treatment:    Reason Eval/Treat Not Completed: OT screened, no needs identified, will sign off . Pt reports that she has all DME and does not feel a need for OT. Acute OT to sign off.   Nena Jordan M 10/20/2014, 11:46 AM

## 2014-10-21 NOTE — Patient Outreach (Signed)
[facility]) Care Management  10/21/2014  SUNSET ROKICKI 1931/11/16 [id number]   Referral received from Inpatient Case Manager, assigned Janci Minor, RN.  Corrie Mckusick. Sharlee Blew [facility] Care Management [facility] CM Assistant Phone: [phone] Fax: [phone]

## 2014-10-25 ENCOUNTER — Encounter: Payer: Self-pay | Admitting: Family Medicine

## 2014-10-25 NOTE — Patient Outreach (Signed)
Notification from Charlesetta Shanks, Rn to close case as patient refused [facility] Care Management services.  Corrie Mckusick. Sharlee Blew [facility] Care Management [facility] CM Assistant Phone: [phone] Fax: [phone]

## 2014-10-27 ENCOUNTER — Telehealth: Payer: Self-pay | Admitting: Family Medicine

## 2014-10-27 NOTE — Telephone Encounter (Signed)
Patient was released from the Hospital last week.  Lynn,[facility], is going to see patient at 11:30 today.  Larita Fife wants to know if patient needs her INR checked today.  If it does need to be checked, please call Larita Fife with the order or call her and let her know when it needs to be checked.

## 2014-10-27 NOTE — Telephone Encounter (Signed)
[name] [name] advise me she has already seen pt today but she is scheduled to go back and see pt either 6/20 (Monday) or 6/21 (Tuesday) and she will check it then

## 2014-10-27 NOTE — Telephone Encounter (Signed)
I would have them check INR if it is not to late.

## 2014-10-31 ENCOUNTER — Telehealth: Payer: Self-pay | Admitting: Family Medicine

## 2014-10-31 ENCOUNTER — Ambulatory Visit (INDEPENDENT_AMBULATORY_CARE_PROVIDER_SITE_OTHER): Payer: Medicare Other | Admitting: *Deleted

## 2014-10-31 DIAGNOSIS — Z5181 Encounter for therapeutic drug level monitoring: Secondary | ICD-10-CM

## 2014-10-31 DIAGNOSIS — I480 Paroxysmal atrial fibrillation: Secondary | ICD-10-CM

## 2014-10-31 LAB — POCT INR: INR: 3.9

## 2014-10-31 NOTE — Telephone Encounter (Signed)
Agreed -

## 2014-10-31 NOTE — Telephone Encounter (Signed)
Patient to hold Coumadin today and tomorrow, then continue taking 1.5 tablets daily.  Recheck in one week.  Orders called to [place] at [facility] [phone].

## 2014-10-31 NOTE — Telephone Encounter (Signed)
Pt inr  Results : inr - 3.9 Pt 46.4 3.75 mg coumadin daily  Callback [phone]

## 2014-10-31 NOTE — Progress Notes (Signed)
Pre visit review using our clinic review tool, if applicable. No additional management support is needed unless otherwise documented below in the visit note. 

## 2014-11-03 ENCOUNTER — Telehealth: Payer: Self-pay | Admitting: *Deleted

## 2014-11-03 NOTE — Telephone Encounter (Signed)
We will need more readings before we can adjust meds.

## 2014-11-03 NOTE — Telephone Encounter (Signed)
Pt home health nurse [name] [name] calling Stating that she is noticing that pt BP is steadily going higher. She is usually in 130's over 70's  Now days she is about 150/80's Please advise what needs to be done. She is a little concerned

## 2014-11-04 ENCOUNTER — Encounter: Payer: Medicare Other | Admitting: Cardiovascular Disease

## 2014-11-07 ENCOUNTER — Telehealth: Payer: Self-pay | Admitting: Family Medicine

## 2014-11-07 NOTE — Telephone Encounter (Signed)
Pt-inr results Pt - 38.4 inr - 3.2 [phone]

## 2014-11-08 ENCOUNTER — Telehealth: Payer: Self-pay | Admitting: Pulmonary Disease

## 2014-11-08 ENCOUNTER — Encounter: Payer: Self-pay | Admitting: Pulmonary Disease

## 2014-11-08 ENCOUNTER — Ambulatory Visit (INDEPENDENT_AMBULATORY_CARE_PROVIDER_SITE_OTHER): Payer: Medicare Other | Admitting: Pulmonary Disease

## 2014-11-08 VITALS — BP 122/74 | HR 74

## 2014-11-08 DIAGNOSIS — J9611 Chronic respiratory failure with hypoxia: Secondary | ICD-10-CM | POA: Diagnosis not present

## 2014-11-08 DIAGNOSIS — J849 Interstitial pulmonary disease, unspecified: Secondary | ICD-10-CM

## 2014-11-08 DIAGNOSIS — Z5181 Encounter for therapeutic drug level monitoring: Secondary | ICD-10-CM

## 2014-11-08 DIAGNOSIS — N183 Chronic kidney disease, stage 3 unspecified: Secondary | ICD-10-CM

## 2014-11-08 MED ORDER — PREDNISONE 5 MG PO TABS: ORAL_TABLET | ORAL | Status: DC

## 2014-11-08 NOTE — Progress Notes (Signed)
Subjective:    Patient ID: [name] [name], female    DOB: 09/20/1931, 81 y.o.   MRN: [id number]  Synopsis: The [name] [name] is a 81 year old female who has a past medical history significant for congestive heart failure, pulmonary hypertension, pulmonary embolism, atrial fibrillation and obstructive sleep apnea. She previously been seen by Dr. Coralyn HellingVineet Sood for management of sleep apnea. She uses CPAP at 7 a 10 cm of water every night. At baseline she is on 2.5 L of oxygen continuously. 12/2012 CXR > Left lung scarring vs pneumonia  10/19 RHC >RA mean 3 RV 37/6 PA 37/15, mean 23 PCWP mean 7 Oxygen saturations: PA 75% AO 92% Cardiac Output(Fick) 8.94 Cardiac Index (Fick) 4.14 Cardiac Output (Thermo) 5.05 Cardiac Index (Thermo) 2.34  02/2014 CT chest high res> mild pulm edema, very mild scattered subpleural reticulation, but assessment limited by likely pulmonary edema; 4mm nodule 04/04/2014 CT high res [place]> patchy ground glass throughout with some subpleural reticulation, suspicious for NSIP, not UIP; mild air trapping, mild cardiomegally and atheroscloerosis  HPI Chief Complaint  Patient presents with  . Follow-up    Pt doing well today, no complaints.  Pt has not yet started esbriet.  Pt also concerned about neb prices.     She saw my partner Dr. Marchelle Gearingamaswamy for her lung scarring. Her breathing has been about the same lately, though she is still short of breath. She denies cough. Her leg swelling is at baseline No chest pain. She continues to take prednisone at 20 mg daily. She continues to use oxygen at 5 L at rest. She has not been walking around much.   Past Medical History  Diagnosis Date  . Congestive heart failure, unspecified     a. ECHO 6/0: EF 55-60%, mild LVH, mildly dilated RV w. mildly dec fx, RVSP 67  . Chronic atrial fibrillation     failed cardioversion in past  -repeat DC-CV on October 5th, 2010  . Personal history of DVT (deep vein thrombosis) 2008    LLE  . HTN  (hypertension)   . Morbid obesity   . Shingles     with postherpetic neuraigia  . GERD (gastroesophageal reflux disease)   . Hyperlipidemia   . Meningitis ~ 1960    [facility]  . Gallstones     s/p cholecystectomy  . CAD (coronary artery disease)     nonobstructive by cath 8/10 - LAD 40-50% w mild PAH mean 29 w PVR 3.2 [place]  . PE (pulmonary embolism) ~ 2010    LLE  . Shortness of breath dyspnea   . Lung interstitial disease   . Hypoxemia   . COPD (chronic obstructive pulmonary disease)   . On home oxygen therapy     "5L; 24/7" (10/11/2014)  . OSA treated with BiPAP     "I think it's BiPAP"  . AAA (abdominal aortic aneurysm)     small  . History of blood transfusion   . Anemia   . Iron deficiency anemia   . History of blood transfusion     "related to nose bleed"  . Gout   . Depression   . Chronic kidney disease (CKD), stage II (mild)       Review of Systems  Constitutional: Positive for fatigue. Negative for fever, chills and diaphoresis.  HENT: Negative for congestion, postnasal drip, rhinorrhea and sinus pressure.   Respiratory: Positive for shortness of breath. Negative for choking, chest tightness and wheezing.   Cardiovascular: Positive for leg swelling.  Negative for chest pain.       Objective:   Physical Exam  Filed Vitals:   11/08/14 1507  BP: 122/74  Pulse: 74  SpO2: 88%  6 L [place]   Gen: chronically ill appearing, wheelchair bound HENT: OP clear, TM's clear, neck supple PULM: Crackles bases B, normal effort CV: RRR, no mgr, massive edema GI: BS+, soft, nontender Derm: no cyanosis or rash Psyche: normal mood and affect      12/2012 CXR > Left lung scarring vs pneumonia  10/19 RHC >RA mean 3 RV 37/6 PA 37/15, mean 23 PCWP mean 7 Oxygen saturations: PA 75% AO 92% Cardiac Output(Fick) 8.94 Cardiac Index (Fick) 4.14 Cardiac Output (Thermo) 5.05 Cardiac Index (Thermo) 2.34  02/2014 CT chest high res> mild pulm edema, very mild  scattered subpleural reticulation, but assessment limited by likely pulmonary edema; 4mm nodule 07/2014 HRCT > persistent ggo bilaterally, mild pleural effusion, scattered interstitial thickening, no major change from prior study  Records from my partners visit were reviewed today, she was recommended to start pirfenidone.   Assessment & Plan:   ILD (interstitial lung disease) I appreciate the consultation with my partner earlier this month who feels that considering her age and lack of response to steroid's it is best to treat this condition as if it is usual interstitial pneumonitis. I agree with that assessment as there has not been a clear benefit from prednisone and this has only worsened the situation with her fluid retention.  So moving forward we will treat this as if it is idiopathic pulmonary fibrosis.  Plan: Slowly taper off prednisone over 3 months, starting with 15 mg daily for 1 month, then 10 mg daily for 1 month, then 5 mg daily for one month  Start pirfenidone 1 pill 3 times a day once arrived, we will not be able to move much higher than this dose considering her chronic kidney disease. She will need to be monitored for liver and kidney toxicity while taking this medication. She was advised at length of the toxicity of pirfenidone and instructed to use sunscreen when out in the sun and watch for GI side effects  > 25 minutes spent in direct consultation with her and her son today  CKD (chronic kidney disease) stage 3, GFR 30-59 ml/min We will need to monitor her kidney function closely as well as her liver function testing. We will do this on a monthly basis after she has started taking the pirfenidone.  Chronic hypoxemic respiratory failure Continue 5 L at rest and 6 L with exertion.    Updated Medication List Outpatient Encounter Prescriptions as of 11/08/2014  Medication Sig  . albuterol (PROVENTIL) (2.5 MG/3ML) 0.083% nebulizer solution Take 3 mLs (2.5 mg total)  by nebulization every 2 (two) hours as needed for wheezing or shortness of breath.  . allopurinol (ZYLOPRIM) 300 MG tablet Take 1 tablet (300 mg total) by mouth daily.  [name] aspirin 81 MG tablet Take 81 mg by mouth daily.  [name] atorvastatin (LIPITOR) 10 MG tablet Take 1 tablet (10 mg total) by mouth daily.  . budesonide (PULMICORT) 0.25 MG/2ML nebulizer solution Take 0.25 mg by nebulization 2 (two) times daily.  . cetirizine (ZYRTEC ALLERGY) 10 MG tablet Take 10 mg by mouth as needed for allergies.   [name] colchicine 0.6 MG tablet Take 0.6 mg by mouth as needed (for flareup).   [name] diltiazem (CARDIZEM CD) 240 MG 24 hr capsule Take 1 capsule (240 mg total) by mouth daily.  [name]  escitalopram (LEXAPRO) 20 MG tablet Take 1 tablet (20 mg total) by mouth daily.  . famotidine (PEPCID) 20 MG tablet Take 1 tablet (20 mg total) by mouth daily.  . ferrous sulfate 325 (65 FE) MG tablet Take 1 tablet (325 mg total) by mouth 2 (two) times daily with a meal.  . furosemide (LASIX) 80 MG tablet Take 1 tablet (80 mg total) by mouth every 8 (eight) hours.  . gabapentin (NEURONTIN) 300 MG capsule Take 1 capsule (300 mg total) by mouth daily.  . metolazone (ZAROXOLYN) 5 MG tablet Take 1 tablet (5 mg total) by mouth daily as needed.  . nystatin (MYCOSTATIN/NYSTOP) 100000 UNIT/GM POWD Apply to flank and groin and under breasts BID  . OVER THE COUNTER MEDICATION Inhale 1 application into the lungs at bedtime. USES BIPAP EVERY BEDTIME FOR SLEEP  . potassium chloride SA (K-DUR,KLOR-CON) 20 MEQ tablet Take 20 mEq by mouth daily.  . predniSONE (DELTASONE) 5 MG tablet Take 15 mg daily for one month Take 10 mg daily for one month Take 5 mg daily for one month  . warfarin (COUMADIN) 2.5 MG tablet Take as directed.  . [DISCONTINUED] predniSONE (DELTASONE) 5 MG tablet Take 20 mg by mouth daily.   . [DISCONTINUED] polyethylene glycol (MIRALAX / GLYCOLAX) packet Take 17 g by mouth once. (Patient not taking: Reported on 11/08/2014)   No  facility-administered encounter medications on file as of 11/08/2014.

## 2014-11-08 NOTE — Patient Instructions (Signed)
We are going to start a slow taper of your prednisone. I when she did take 15 mg daily for the next month, then 10 mg daily for a month, then 5 mg daily for a month. When you receive the pirfenidone in the mail, take 1 pill 3 times a day We will monitor your blood work while you're taking the medication. We will see you back in 8 weeks or sooner if needed

## 2014-11-08 NOTE — Assessment & Plan Note (Signed)
I appreciate the consultation with my partner earlier this month who feels that considering her age and lack of response to steroid's it is best to treat this condition as if it is usual interstitial pneumonitis. I agree with that assessment as there has not been a clear benefit from prednisone and this has only worsened the situation with her fluid retention.  So moving forward we will treat this as if it is idiopathic pulmonary fibrosis.  Plan: Slowly taper off prednisone over 3 months, starting with 15 mg daily for 1 month, then 10 mg daily for 1 month, then 5 mg daily for one month  Start pirfenidone 1 pill 3 times a day once arrived, we will not be able to move much higher than this dose considering her chronic kidney disease. She will need to be monitored for liver and kidney toxicity while taking this medication. She was advised at length of the toxicity of pirfenidone and instructed to use sunscreen when out in the sun and watch for GI side effects  > 25 minutes spent in direct consultation with her and her son today

## 2014-11-08 NOTE — Telephone Encounter (Signed)
Continue current dose, recheck in 2 weeks.

## 2014-11-08 NOTE — Assessment & Plan Note (Signed)
Continue 5 L at rest and 6 L with exertion.

## 2014-11-08 NOTE — Assessment & Plan Note (Signed)
We will need to monitor her kidney function closely as well as her liver function testing. We will do this on a monthly basis after she has started taking the pirfenidone.

## 2014-11-08 NOTE — Telephone Encounter (Signed)
Dx code has been put on form and resent.  Nothing further needed at this time.

## 2014-11-08 NOTE — Telephone Encounter (Signed)
Left message for [name] to have [name] [name] continue her current dose of warfarin and recheck PT/INR in two weeks.

## 2014-11-08 NOTE — Telephone Encounter (Signed)
Morrie Sheldonshley to do you still have the form to provide DX code on form? thanks

## 2014-11-15 ENCOUNTER — Telehealth: Payer: Self-pay | Admitting: Pulmonary Disease

## 2014-11-15 NOTE — Telephone Encounter (Signed)
Spoke with pharmacist at [facility] - states that they need clarification of the patient's Pirfenidone Rx Forms were faxed last week for Pharmacist and the quantity does not seem to match the instructions.  I advised that I do not have the form to view it and that I will send to Dr Ulyses JarredMcQuaid's nurse.  Please advise Morrie Sheldonshley if you still have these forms. They are also being faxed to triage so that proper changes could be made.

## 2014-11-15 NOTE — Telephone Encounter (Signed)
Spoke with pt's home care nurse, Larita FifeLynn. She is aware of BQ's recommendations. They will call back if pt's symptoms continue after medication change. Nothing further was needed at this time.

## 2014-11-15 NOTE — Telephone Encounter (Signed)
Tried to call caregiver Toni AmendCourtney but unable to LM.

## 2014-11-15 NOTE — Telephone Encounter (Signed)
[facility] with [facility] would like to talk to a nurse in regards to pt treatment. They have more information on today's visit.  [phone]

## 2014-11-15 NOTE — Telephone Encounter (Signed)
To clarify, she should be on pirfenidone 267mg  tid  I'm concerned about her symptoms. I doubt that this is related to the pirfenidone, but I can't rule it out.  Have her stop the medicine for 3 days, then start taking one pill daily, after a week, one pill bid, then after a week one pill tid  Should she feel worse she needs to be seen.  I have openings in [place] tomorrow if needed

## 2014-11-15 NOTE — Telephone Encounter (Signed)
Pt reports starting Pirfenidone 150mg  (1 po TID) Pt c/o increased cough, wheezing, SOB, HA and light pink mucus x 1 day Denies chest tightness/pain.  Pt does not want to stop the medication - asking for rec's to decrease the current symptoms she is having. Pt believes that a lot of her symptoms are from the OFEV.   Please advise Dr [name]. Thanks.

## 2014-11-15 NOTE — Telephone Encounter (Signed)
Per both MR and BQ, due to a low GFR pt will not be titrating up to the standard 3 tabs tid dosage: she is to remain at 1 tab tid so a disp #90 will last pt a 30 day supply.   [facility]ed [facility] at [phone], verified this with pharmacist.  Nothing further needed at this time.

## 2014-11-16 ENCOUNTER — Inpatient Hospital Stay (HOSPITAL_COMMUNITY): Payer: Medicare Other

## 2014-11-16 ENCOUNTER — Inpatient Hospital Stay (HOSPITAL_COMMUNITY)
Admission: AD | Admit: 2014-11-16 | Discharge: 2014-11-24 | DRG: 865 | Disposition: A | Discharge status: Rehabilitation Facility | Payer: Medicare Other | Source: Ambulatory Visit | Attending: Internal Medicine | Admitting: Internal Medicine

## 2014-11-16 ENCOUNTER — Telehealth: Payer: Self-pay | Admitting: Pulmonary Disease

## 2014-11-16 ENCOUNTER — Ambulatory Visit (INDEPENDENT_AMBULATORY_CARE_PROVIDER_SITE_OTHER): Payer: Medicare Other | Admitting: Pulmonary Disease

## 2014-11-16 ENCOUNTER — Encounter: Payer: Self-pay | Admitting: Pulmonary Disease

## 2014-11-16 VITALS — BP 124/72 | HR 80 | Temp 98.5°F

## 2014-11-16 DIAGNOSIS — Z86711 Personal history of pulmonary embolism: Secondary | ICD-10-CM

## 2014-11-16 DIAGNOSIS — Z9841 Cataract extraction status, right eye: Secondary | ICD-10-CM | POA: Diagnosis not present

## 2014-11-16 DIAGNOSIS — G4733 Obstructive sleep apnea (adult) (pediatric): Secondary | ICD-10-CM | POA: Diagnosis not present

## 2014-11-16 DIAGNOSIS — Z7901 Long term (current) use of anticoagulants: Secondary | ICD-10-CM | POA: Diagnosis not present

## 2014-11-16 DIAGNOSIS — T502X5A Adverse effect of carbonic-anhydrase inhibitors, benzothiadiazides and other diuretics, initial encounter: Secondary | ICD-10-CM | POA: Diagnosis not present

## 2014-11-16 DIAGNOSIS — K219 Gastro-esophageal reflux disease without esophagitis: Secondary | ICD-10-CM | POA: Diagnosis present

## 2014-11-16 DIAGNOSIS — I5032 Chronic diastolic (congestive) heart failure: Secondary | ICD-10-CM | POA: Diagnosis present

## 2014-11-16 DIAGNOSIS — B348 Other viral infections of unspecified site: Principal | ICD-10-CM | POA: Diagnosis present

## 2014-11-16 DIAGNOSIS — Z86718 Personal history of other venous thrombosis and embolism: Secondary | ICD-10-CM

## 2014-11-16 DIAGNOSIS — Z7952 Long term (current) use of systemic steroids: Secondary | ICD-10-CM | POA: Diagnosis not present

## 2014-11-16 DIAGNOSIS — F329 Major depressive disorder, single episode, unspecified: Secondary | ICD-10-CM | POA: Diagnosis present

## 2014-11-16 DIAGNOSIS — I480 Paroxysmal atrial fibrillation: Secondary | ICD-10-CM

## 2014-11-16 DIAGNOSIS — I482 Chronic atrial fibrillation: Secondary | ICD-10-CM | POA: Diagnosis present

## 2014-11-16 DIAGNOSIS — M109 Gout, unspecified: Secondary | ICD-10-CM | POA: Diagnosis present

## 2014-11-16 DIAGNOSIS — I5031 Acute diastolic (congestive) heart failure: Secondary | ICD-10-CM | POA: Diagnosis not present

## 2014-11-16 DIAGNOSIS — Z79899 Other long term (current) drug therapy: Secondary | ICD-10-CM

## 2014-11-16 DIAGNOSIS — R0600 Dyspnea, unspecified: Secondary | ICD-10-CM | POA: Diagnosis not present

## 2014-11-16 DIAGNOSIS — I5030 Unspecified diastolic (congestive) heart failure: Secondary | ICD-10-CM | POA: Diagnosis not present

## 2014-11-16 DIAGNOSIS — N182 Chronic kidney disease, stage 2 (mild): Secondary | ICD-10-CM | POA: Diagnosis present

## 2014-11-16 DIAGNOSIS — Z961 Presence of intraocular lens: Secondary | ICD-10-CM | POA: Diagnosis present

## 2014-11-16 DIAGNOSIS — R0602 Shortness of breath: Secondary | ICD-10-CM | POA: Diagnosis present

## 2014-11-16 DIAGNOSIS — J849 Interstitial pulmonary disease, unspecified: Secondary | ICD-10-CM | POA: Diagnosis not present

## 2014-11-16 DIAGNOSIS — J449 Chronic obstructive pulmonary disease, unspecified: Secondary | ICD-10-CM | POA: Diagnosis present

## 2014-11-16 DIAGNOSIS — J9620 Acute and chronic respiratory failure, unspecified whether with hypoxia or hypercapnia: Secondary | ICD-10-CM | POA: Diagnosis present

## 2014-11-16 DIAGNOSIS — J9621 Acute and chronic respiratory failure with hypoxia: Secondary | ICD-10-CM | POA: Diagnosis present

## 2014-11-16 DIAGNOSIS — Z7982 Long term (current) use of aspirin: Secondary | ICD-10-CM | POA: Diagnosis not present

## 2014-11-16 DIAGNOSIS — R627 Adult failure to thrive: Secondary | ICD-10-CM | POA: Diagnosis not present

## 2014-11-16 DIAGNOSIS — A419 Sepsis, unspecified organism: Secondary | ICD-10-CM | POA: Diagnosis not present

## 2014-11-16 DIAGNOSIS — E876 Hypokalemia: Secondary | ICD-10-CM | POA: Diagnosis present

## 2014-11-16 DIAGNOSIS — J189 Pneumonia, unspecified organism: Secondary | ICD-10-CM

## 2014-11-16 DIAGNOSIS — N183 Chronic kidney disease, stage 3 (moderate): Secondary | ICD-10-CM | POA: Diagnosis not present

## 2014-11-16 DIAGNOSIS — I2510 Atherosclerotic heart disease of native coronary artery without angina pectoris: Secondary | ICD-10-CM | POA: Diagnosis present

## 2014-11-16 DIAGNOSIS — J8410 Pulmonary fibrosis, unspecified: Secondary | ICD-10-CM | POA: Diagnosis present

## 2014-11-16 DIAGNOSIS — Z7951 Long term (current) use of inhaled steroids: Secondary | ICD-10-CM | POA: Diagnosis not present

## 2014-11-16 DIAGNOSIS — N189 Chronic kidney disease, unspecified: Secondary | ICD-10-CM | POA: Diagnosis not present

## 2014-11-16 DIAGNOSIS — Z9842 Cataract extraction status, left eye: Secondary | ICD-10-CM

## 2014-11-16 DIAGNOSIS — N390 Urinary tract infection, site not specified: Secondary | ICD-10-CM | POA: Diagnosis not present

## 2014-11-16 DIAGNOSIS — J810 Acute pulmonary edema: Secondary | ICD-10-CM | POA: Diagnosis not present

## 2014-11-16 DIAGNOSIS — E785 Hyperlipidemia, unspecified: Secondary | ICD-10-CM | POA: Diagnosis present

## 2014-11-16 DIAGNOSIS — I481 Persistent atrial fibrillation: Secondary | ICD-10-CM | POA: Diagnosis not present

## 2014-11-16 DIAGNOSIS — J9690 Respiratory failure, unspecified, unspecified whether with hypoxia or hypercapnia: Secondary | ICD-10-CM

## 2014-11-16 DIAGNOSIS — J9601 Acute respiratory failure with hypoxia: Secondary | ICD-10-CM | POA: Diagnosis not present

## 2014-11-16 DIAGNOSIS — R609 Edema, unspecified: Secondary | ICD-10-CM

## 2014-11-16 DIAGNOSIS — Z6841 Body Mass Index (BMI) 40.0 and over, adult: Secondary | ICD-10-CM | POA: Diagnosis not present

## 2014-11-16 DIAGNOSIS — I4891 Unspecified atrial fibrillation: Secondary | ICD-10-CM | POA: Diagnosis not present

## 2014-11-16 DIAGNOSIS — I129 Hypertensive chronic kidney disease with stage 1 through stage 4 chronic kidney disease, or unspecified chronic kidney disease: Secondary | ICD-10-CM | POA: Diagnosis present

## 2014-11-16 DIAGNOSIS — I5033 Acute on chronic diastolic (congestive) heart failure: Secondary | ICD-10-CM | POA: Diagnosis not present

## 2014-11-16 DIAGNOSIS — R5381 Other malaise: Secondary | ICD-10-CM | POA: Diagnosis not present

## 2014-11-16 LAB — MAGNESIUM: MAGNESIUM: 2.4 mg/dL (ref 1.7–2.4)

## 2014-11-16 LAB — PHOSPHORUS: PHOSPHORUS: 3.2 mg/dL (ref 2.5–4.6)

## 2014-11-16 LAB — CBC WITH DIFFERENTIAL/PLATELET
BASOS PCT: 0 % (ref 0–1)
Basophils Absolute: 0 10*3/uL (ref 0.0–0.1)
EOS ABS: 0 10*3/uL (ref 0.0–0.7)
Eosinophils Relative: 0 % (ref 0–5)
HCT: 44.4 % (ref 36.0–46.0)
Hemoglobin: 14.1 g/dL (ref 12.0–15.0)
LYMPHS ABS: 0.8 10*3/uL (ref 0.7–4.0)
Lymphocytes Relative: 6 % — ABNORMAL LOW (ref 12–46)
MCH: 32.5 pg (ref 26.0–34.0)
MCHC: 31.8 g/dL (ref 30.0–36.0)
MCV: 102.3 fL — AB (ref 78.0–100.0)
Monocytes Absolute: 0.9 10*3/uL (ref 0.1–1.0)
Monocytes Relative: 7 % (ref 3–12)
NEUTROS PCT: 87 % — AB (ref 43–77)
Neutro Abs: 11.5 10*3/uL — ABNORMAL HIGH (ref 1.7–7.7)
PLATELETS: 157 10*3/uL (ref 150–400)
RBC: 4.34 MIL/uL (ref 3.87–5.11)
RDW: 18.3 % — AB (ref 11.5–15.5)
WBC: 13.2 10*3/uL — ABNORMAL HIGH (ref 4.0–10.5)

## 2014-11-16 LAB — COMPREHENSIVE METABOLIC PANEL
ALT: 35 U/L (ref 14–54)
AST: 34 U/L (ref 15–41)
Albumin: 3.2 g/dL — ABNORMAL LOW (ref 3.5–5.0)
Alkaline Phosphatase: 51 U/L (ref 38–126)
Anion gap: 10 (ref 5–15)
BILIRUBIN TOTAL: 0.6 mg/dL (ref 0.3–1.2)
BUN: 23 mg/dL — ABNORMAL HIGH (ref 6–20)
CHLORIDE: 96 mmol/L — AB (ref 101–111)
CO2: 33 mmol/L — ABNORMAL HIGH (ref 22–32)
CREATININE: 1.25 mg/dL — AB (ref 0.44–1.00)
Calcium: 8.9 mg/dL (ref 8.9–10.3)
GFR, EST AFRICAN AMERICAN: 45 mL/min — AB (ref >60)
GFR, EST NON AFRICAN AMERICAN: 39 mL/min — AB (ref >60)
GLUCOSE: 118 mg/dL — AB (ref 65–99)
POTASSIUM: 4.4 mmol/L (ref 3.5–5.1)
SODIUM: 139 mmol/L (ref 135–145)
Total Protein: 6.2 g/dL — ABNORMAL LOW (ref 6.5–8.1)

## 2014-11-16 LAB — PROCALCITONIN: Procalcitonin: 0.28 ng/mL

## 2014-11-16 LAB — MRSA PCR SCREENING: MRSA BY PCR: POSITIVE — AB

## 2014-11-16 LAB — TROPONIN I: Troponin I: 0.04 ng/mL — ABNORMAL HIGH (ref <0.031)

## 2014-11-16 LAB — STREP PNEUMONIAE URINARY ANTIGEN: STREP PNEUMO URINARY ANTIGEN: NEGATIVE

## 2014-11-16 LAB — BRAIN NATRIURETIC PEPTIDE: B NATRIURETIC PEPTIDE 5: 160.9 pg/mL — AB (ref 0.0–100.0)

## 2014-11-16 LAB — PROTIME-INR
INR: 3.28 — ABNORMAL HIGH (ref 0.00–1.49)
Prothrombin Time: 32.7 seconds — ABNORMAL HIGH (ref 11.6–15.2)

## 2014-11-16 MED ORDER — SODIUM CHLORIDE 0.9 % IJ SOLN
3.0000 mL | Freq: Two times a day (BID) | INTRAMUSCULAR | Status: DC
  Administered 2014-11-16 – 2014-11-24 (×16): 3 mL via INTRAVENOUS

## 2014-11-16 MED ORDER — FAMOTIDINE 20 MG PO TABS
20.0000 mg | ORAL_TABLET | Freq: Every day | ORAL | Status: DC
  Administered 2014-11-17 – 2014-11-24 (×8): 20 mg via ORAL
  Filled 2014-11-16 (×10): qty 1

## 2014-11-16 MED ORDER — BUDESONIDE 0.25 MG/2ML IN SUSP
0.2500 mg | Freq: Two times a day (BID) | RESPIRATORY_TRACT | Status: DC
  Administered 2014-11-16 – 2014-11-24 (×16): 0.25 mg via RESPIRATORY_TRACT
  Filled 2014-11-16 (×23): qty 2

## 2014-11-16 MED ORDER — ATORVASTATIN CALCIUM 10 MG PO TABS
10.0000 mg | ORAL_TABLET | Freq: Every day | ORAL | Status: DC
  Administered 2014-11-17 – 2014-11-24 (×8): 10 mg via ORAL
  Filled 2014-11-16 (×10): qty 1

## 2014-11-16 MED ORDER — DILTIAZEM HCL ER COATED BEADS 240 MG PO CP24
240.0000 mg | ORAL_CAPSULE | Freq: Every day | ORAL | Status: DC
  Administered 2014-11-17 – 2014-11-24 (×8): 240 mg via ORAL
  Filled 2014-11-16 (×5): qty 1
  Filled 2014-11-16: qty 2
  Filled 2014-11-16 (×2): qty 1
  Filled 2014-11-16: qty 2
  Filled 2014-11-16: qty 1

## 2014-11-16 MED ORDER — COLCHICINE 0.6 MG PO TABS
0.6000 mg | ORAL_TABLET | ORAL | Status: DC | PRN
  Filled 2014-11-16: qty 1

## 2014-11-16 MED ORDER — SODIUM CHLORIDE 0.9 % IV SOLN: 250.0000 mL | INTRAVENOUS | Status: DC | PRN

## 2014-11-16 MED ORDER — PREDNISONE 20 MG PO TABS
20.0000 mg | ORAL_TABLET | Freq: Every day | ORAL | Status: DC
  Administered 2014-11-17: 20 mg via ORAL
  Filled 2014-11-16 (×2): qty 1

## 2014-11-16 MED ORDER — ALBUTEROL SULFATE (2.5 MG/3ML) 0.083% IN NEBU: 2.5000 mg | INHALATION_SOLUTION | RESPIRATORY_TRACT | Status: DC | PRN

## 2014-11-16 MED ORDER — METOLAZONE 5 MG PO TABS
5.0000 mg | ORAL_TABLET | Freq: Every day | ORAL | Status: DC
  Administered 2014-11-17 – 2014-11-24 (×8): 5 mg via ORAL
  Filled 2014-11-16 (×8): qty 1

## 2014-11-16 MED ORDER — ALBUTEROL SULFATE (2.5 MG/3ML) 0.083% IN NEBU
2.5000 mg | INHALATION_SOLUTION | RESPIRATORY_TRACT | Status: DC | PRN
  Administered 2014-11-16 – 2014-11-20 (×3): 2.5 mg via RESPIRATORY_TRACT
  Filled 2014-11-16 (×3): qty 3

## 2014-11-16 MED ORDER — ASPIRIN EC 81 MG PO TBEC
81.0000 mg | DELAYED_RELEASE_TABLET | Freq: Every day | ORAL | Status: DC
  Administered 2014-11-17 – 2014-11-24 (×8): 81 mg via ORAL
  Filled 2014-11-16 (×10): qty 1

## 2014-11-16 MED ORDER — ALBUTEROL SULFATE (2.5 MG/3ML) 0.083% IN NEBU
2.5000 mg | INHALATION_SOLUTION | Freq: Four times a day (QID) | RESPIRATORY_TRACT | Status: DC
  Administered 2014-11-16 – 2014-11-24 (×28): 2.5 mg via RESPIRATORY_TRACT
  Filled 2014-11-16 (×28): qty 3

## 2014-11-16 MED ORDER — ACETAMINOPHEN 325 MG PO TABS
650.0000 mg | ORAL_TABLET | ORAL | Status: DC | PRN
  Administered 2014-11-17 – 2014-11-20 (×5): 650 mg via ORAL
  Filled 2014-11-16 (×6): qty 2

## 2014-11-16 MED ORDER — ONDANSETRON HCL 4 MG/2ML IJ SOLN: 4.0000 mg | Freq: Four times a day (QID) | INTRAMUSCULAR | Status: DC | PRN

## 2014-11-16 MED ORDER — GABAPENTIN 300 MG PO CAPS
300.0000 mg | ORAL_CAPSULE | Freq: Every day | ORAL | Status: DC
  Administered 2014-11-17 – 2014-11-24 (×8): 300 mg via ORAL
  Filled 2014-11-16 (×10): qty 1

## 2014-11-16 MED ORDER — POTASSIUM CHLORIDE CRYS ER 20 MEQ PO TBCR
20.0000 meq | EXTENDED_RELEASE_TABLET | Freq: Every day | ORAL | Status: DC
  Administered 2014-11-17 – 2014-11-19 (×3): 20 meq via ORAL
  Filled 2014-11-16 (×4): qty 1

## 2014-11-16 MED ORDER — PIPERACILLIN-TAZOBACTAM 3.375 G IVPB
3.3750 g | Freq: Three times a day (TID) | INTRAVENOUS | Status: DC
  Administered 2014-11-17 – 2014-11-18 (×5): 3.375 g via INTRAVENOUS
  Filled 2014-11-16 (×6): qty 50

## 2014-11-16 MED ORDER — VANCOMYCIN HCL 10 G IV SOLR
1500.0000 mg | INTRAVENOUS | Status: DC
  Administered 2014-11-17: 1500 mg via INTRAVENOUS
  Filled 2014-11-16 (×2): qty 1500

## 2014-11-16 MED ORDER — LORATADINE 10 MG PO TABS
10.0000 mg | ORAL_TABLET | Freq: Every day | ORAL | Status: DC
  Administered 2014-11-17 – 2014-11-24 (×8): 10 mg via ORAL
  Filled 2014-11-16 (×10): qty 1

## 2014-11-16 MED ORDER — PIPERACILLIN-TAZOBACTAM 3.375 G IVPB 30 MIN
3.3750 g | Freq: Once | INTRAVENOUS | Status: AC
  Administered 2014-11-16: 3.375 g via INTRAVENOUS
  Filled 2014-11-16: qty 50

## 2014-11-16 MED ORDER — SODIUM CHLORIDE 0.9 % IJ SOLN
3.0000 mL | INTRAMUSCULAR | Status: DC | PRN
  Administered 2014-11-23: 3 mL via INTRAVENOUS
  Filled 2014-11-16: qty 3

## 2014-11-16 MED ORDER — VANCOMYCIN HCL 10 G IV SOLR
2000.0000 mg | Freq: Once | INTRAVENOUS | Status: AC
  Administered 2014-11-16: 2000 mg via INTRAVENOUS
  Filled 2014-11-16: qty 2000

## 2014-11-16 MED ORDER — ALBUTEROL SULFATE (2.5 MG/3ML) 0.083% IN NEBU
INHALATION_SOLUTION | RESPIRATORY_TRACT | Status: AC
  Administered 2014-11-16: 2.5 mg via RESPIRATORY_TRACT
  Filled 2014-11-16: qty 3

## 2014-11-16 MED ORDER — PIPERACILLIN-TAZOBACTAM 3.375 G IVPB
3.3750 g | Freq: Three times a day (TID) | INTRAVENOUS | Status: DC
  Filled 2014-11-16: qty 50

## 2014-11-16 MED ORDER — FERROUS SULFATE 325 (65 FE) MG PO TABS
325.0000 mg | ORAL_TABLET | Freq: Two times a day (BID) | ORAL | Status: DC
  Administered 2014-11-16 – 2014-11-24 (×17): 325 mg via ORAL
  Filled 2014-11-16 (×20): qty 1

## 2014-11-16 MED ORDER — ALLOPURINOL 300 MG PO TABS
300.0000 mg | ORAL_TABLET | Freq: Every day | ORAL | Status: DC
  Administered 2014-11-17 – 2014-11-24 (×8): 300 mg via ORAL
  Filled 2014-11-16 (×10): qty 1

## 2014-11-16 MED ORDER — FUROSEMIDE 80 MG PO TABS
80.0000 mg | ORAL_TABLET | Freq: Three times a day (TID) | ORAL | Status: DC
  Administered 2014-11-16 – 2014-11-17 (×3): 80 mg via ORAL
  Filled 2014-11-16: qty 1
  Filled 2014-11-16: qty 2
  Filled 2014-11-16 (×5): qty 1

## 2014-11-16 MED ORDER — ESCITALOPRAM OXALATE 20 MG PO TABS
20.0000 mg | ORAL_TABLET | Freq: Every day | ORAL | Status: DC
  Administered 2014-11-17 – 2014-11-24 (×8): 20 mg via ORAL
  Filled 2014-11-16 (×10): qty 1

## 2014-11-16 NOTE — Telephone Encounter (Signed)
Called and spoke to [name] [name]. Pt is feeling worse today than yesterday. Appt made with BQ in [place] office today (7/6) at 1430. C[place]ney verbalized understanding and denied any further questions or concerns at this time.

## 2014-11-16 NOTE — Progress Notes (Addendum)
ANTIBIOTIC and ANTICOAGULATION CONSULT NOTE - INITIAL  Pharmacy Consult for Vancomycin, Zosyn, and Coumadin Indication: pneumonia/ afib, hx DVT/PE  No Known Allergies  Patient Measurements:    Height 5'4" Weight 294 lbs (133 kg) IBW: 54.7 kg  Vital Signs: Temp: 98.5 F (36.9 C) (07/06 1431) Temp Source: Oral (07/06 1431) BP: 153/55 mmHg (07/06 1746) Pulse Rate: 72 (07/06 1746) Intake/Output from previous day:   Intake/Output from this shift:    Labs: No results for input(s): WBC, HGB, PLT, LABCREA, CREATININE in the last 72 hours. CrCl cannot be calculated (Unknown ideal weight.). No results for input(s): VANCOTROUGH, VANCOPEAK, VANCORANDOM, GENTTROUGH, GENTPEAK, GENTRANDOM, TOBRATROUGH, TOBRAPEAK, TOBRARND, AMIKACINPEAK, AMIKACINTROU, AMIKACIN in the last 72 hours.   No current labs:  SCr from 10/20/14 = 1.2, with calculated CrCl ~ 40 ml/min   Microbiology: No results found for this or any previous visit (from the past 720 hour(s)).  Medical History: Past Medical History  Diagnosis Date  . Congestive heart failure, unspecified     a. ECHO 6/0: EF 55-60%, mild LVH, mildly dilated RV w. mildly dec fx, RVSP 67  . Chronic atrial fibrillation     failed cardioversion in past  -repeat DC-CV on October 5th, 2010  . Personal history of DVT (deep vein thrombosis) 2008    LLE  . HTN (hypertension)   . Morbid obesity   . Shingles     with postherpetic neuraigia  . GERD (gastroesophageal reflux disease)   . Hyperlipidemia   . Meningitis ~ 1960    [facility]  . Gallstones     s/p cholecystectomy  . CAD (coronary artery disease)     nonobstructive by cath 8/10 - LAD 40-50% w mild PAH mean 29 w PVR 3.2 [place]  . PE (pulmonary embolism) ~ 2010    LLE  . Shortness of breath dyspnea   . Lung interstitial disease   . Hypoxemia   . COPD (chronic obstructive pulmonary disease)   . On home oxygen therapy     "5L; 24/7" (10/11/2014)  . OSA treated with BiPAP    "I think it's BiPAP"  . AAA (abdominal aortic aneurysm)     small  . History of blood transfusion   . Anemia   . Iron deficiency anemia   . History of blood transfusion     "related to nose bleed"  . Gout   . Depression   . Chronic kidney disease (CKD), stage II (mild)     Medications:  Prescriptions prior to admission  Medication Sig Dispense Refill Last Dose  . albuterol (PROVENTIL) (2.5 MG/3ML) 0.083% nebulizer solution Take 3 mLs (2.5 mg total) by nebulization every 2 (two) hours as needed for wheezing or shortness of breath. 75 mL 12 Taking  . allopurinol (ZYLOPRIM) 300 MG tablet Take 1 tablet (300 mg total) by mouth daily. 30 tablet 11 Taking  . aspirin 81 MG tablet Take 81 mg by mouth daily.   Taking  . atorvastatin (LIPITOR) 10 MG tablet Take 1 tablet (10 mg total) by mouth daily. 30 tablet 5 Taking  . budesonide (PULMICORT) 0.25 MG/2ML nebulizer solution Take 0.25 mg by nebulization 2 (two) times daily.   Taking  . cetirizine (ZYRTEC ALLERGY) 10 MG tablet Take 10 mg by mouth as needed for allergies.    Taking  . colchicine 0.6 MG tablet Take 0.6 mg by mouth as needed (for flareup).    Taking  . diltiazem (CARDIZEM CD) 240 MG 24 hr capsule Take 1  capsule (240 mg total) by mouth daily. 30 capsule 0 Taking  . escitalopram (LEXAPRO) 20 MG tablet Take 1 tablet (20 mg total) by mouth daily. 30 tablet 5 Taking  . famotidine (PEPCID) 20 MG tablet Take 1 tablet (20 mg total) by mouth daily. 30 tablet 6 Taking  . ferrous sulfate 325 (65 FE) MG tablet Take 1 tablet (325 mg total) by mouth 2 (two) times daily with a meal. 60 tablet 0 Taking  . furosemide (LASIX) 80 MG tablet Take 1 tablet (80 mg total) by mouth every 8 (eight) hours. 90 tablet 1 Taking  . gabapentin (NEURONTIN) 300 MG capsule Take 1 capsule (300 mg total) by mouth daily. 90 capsule 0 Taking  . metolazone (ZAROXOLYN) 5 MG tablet Take 1 tablet (5 mg total) by mouth daily as needed. 30 tablet 1 Taking  . nystatin  (MYCOSTATIN/NYSTOP) 100000 UNIT/GM POWD Apply to flank and groin and under breasts BID 60 g 2 Taking  . OVER THE COUNTER MEDICATION Inhale 1 application into the lungs at bedtime. USES BIPAP EVERY BEDTIME FOR SLEEP   Taking  . Pirfenidone (ESBRIET) 267 MG CAPS Take 1 tablet by mouth 3 (three) times daily.   Taking  . potassium chloride SA (K-DUR,KLOR-CON) 20 MEQ tablet Take 20 mEq by mouth daily.   Taking  . predniSONE (DELTASONE) 5 MG tablet Take 15 mg daily for one month Take 10 mg daily for one month Take 5 mg daily for one month 180 tablet 0 Taking  . warfarin (COUMADIN) 2.5 MG tablet Take as directed. 90 tablet 0 Taking   Assessment: 81 yo F admitted from pulmonary MD office with fever, cough, and worsening hypoxemic respiratory failure.  To start Vancomycin and Zosyn for HCAP.  Of note, pt on Coumadin PTA for hx afib, DVT, PE.  PTA dose was 3.75mg  daily (1.5 tabs of 2.5mg ) with last INR 3.2 on 11/07/14.   PMH: pulmonary fibrosis, HF, Afib, hx DVT (2008) and PE (2010), CAD, HLD, HTN, morbid obesity, shingles, GERD, COPD on home O2, Anemia, Gout, Depression  Goal of Therapy:  Vancomycin trough level 15-20 mcg/ml  Renal dose adjustment of antibiotics Per outpatient records, INR goal 1.8-2.2  Plan:  Baseline labs pending. Vancomycin 2000 mg IV x 1 Zosyn 3.375gm IV x 1 (already ordered)  Will follow-up on SCr and INR for further medication dosing.  [employer], Pharm.D., BCPS Clinical Pharmacist Pager [phone] 11/16/2014 6:10 PM  Addendum: Baseline labs completed: WBC 13.2 SCr 1.25, CrCl ~ 40 ml/min INR 3.28  Start Zosyn 3.375 gm IV q8h (4 hour infusion).   Next dose due tomorrow at 0200. Vancomycin 1500 mg IV q24h - next dose tomorrow at 2000. No Coumadin tonight - daily INR ordered.  [employer], Pharm.D., BCPS Clinical Pharmacist Pager [phone] 11/16/2014 8:12 PM

## 2014-11-16 NOTE — H&P (Signed)
PULMONARY / CRITICAL CARE MEDICINE   Name: [name] [name] MRN: [id number] DOB: 12/23/1931    ADMISSION DATE:  11/16/2014 CONSULTATION DATE:  11/16/2014  REFERRING MD :  [name], pulmonary office  CHIEF COMPLAINT:  Shortness of breath  INITIAL PRESENTATION: 81 year old female with a past medical history significant for pulmonary fibrosis believed to be usual interstitial pneumonitis as well as diastolic heart failure was admitted from the pulmonary office on 11/16/2014 with a fever, cough, and worsening hypoxemic respiratory failure.  STUDIES:    SIGNIFICANT EVENTS:    HISTORY OF PRESENT ILLNESS:  [name] [name] has not been doing well. She said that a week ago she started having a nosebleed which only lasted for one day. This was attributed to using oxygen without a humidifier. After that she felt okay, at baseline for her. However, on Saturday she took her first dose of the pirfenidone. Within 24 hours she started coughing, feeling weak, and producing black mucus. This eventually turned into a pink frothy mucus. She had a fever this morning to 101 and her family called EMS because she was gray in color. EMS came out and performed an EKG and vital signs.  Her leg swelling has been at baseline recently.  She denies chest pain. She took  Tylenol today. She has not had abdominal pain nausea vomiting or diarrhea.  PAST MEDICAL HISTORY :   has a past medical history of Congestive heart failure, unspecified; Chronic atrial fibrillation; Personal history of DVT (deep vein thrombosis) (2008); HTN (hypertension); Morbid obesity; Shingles; GERD (gastroesophageal reflux disease); Hyperlipidemia; Meningitis (~ 1960); Gallstones; CAD (coronary artery disease); PE (pulmonary embolism) (~ 2010); Shortness of breath dyspnea; Lung interstitial disease; Hypoxemia; COPD (chronic obstructive pulmonary disease); On home oxygen therapy; OSA treated with BiPAP; AAA (abdominal aortic aneurysm); History of blood transfusion;  Anemia; Iron deficiency anemia; History of blood transfusion; Gout; Depression; and Chronic kidney disease (CKD), stage II (mild).  has past surgical history that includes Appendectomy (1947); Tonsillectomy (1946); Cardioversion (01/2007); right heart catheterization (N/A, 02/28/2014); Cardiac catheterization (02/2014); and Cataract extraction w/ intraocular lens  implant, bilateral (Bilateral). Prior to Admission medications   Medication Sig Start Date End Date Taking? Authorizing Provider  albuterol (PROVENTIL) (2.5 MG/3ML) 0.083% nebulizer solution Take 3 mLs (2.5 mg total) by nebulization every 2 (two) hours as needed for wheezing or shortness of breath. 10/20/14   Richarda OverlieNayana Abrol, MD  allopurinol (ZYLOPRIM) 300 MG tablet Take 1 tablet (300 mg total) by mouth daily. 06/08/14   Amy Michelle NasutiE Bedsole, MD  aspirin 81 MG tablet Take 81 mg by mouth daily.    Historical Provider, MD  atorvastatin (LIPITOR) 10 MG tablet Take 1 tablet (10 mg total) by mouth daily. 07/18/14   Amy Michelle NasutiE Bedsole, MD  budesonide (PULMICORT) 0.25 MG/2ML nebulizer solution Take 0.25 mg by nebulization 2 (two) times daily.    Historical Provider, MD  cetirizine (ZYRTEC ALLERGY) 10 MG tablet Take 10 mg by mouth as needed for allergies.     Historical Provider, MD  colchicine 0.6 MG tablet Take 0.6 mg by mouth as needed (for flareup).  01/11/14   Historical Provider, MD  diltiazem (CARDIZEM CD) 240 MG 24 hr capsule Take 1 capsule (240 mg total) by mouth daily. 08/07/14   Renae FickleMackenzie Short, MD  escitalopram (LEXAPRO) 20 MG tablet Take 1 tablet (20 mg total) by mouth daily. 05/02/14   Amy Michelle NasutiE Bedsole, MD  famotidine (PEPCID) 20 MG tablet Take 1 tablet (20 mg total) by mouth daily. 04/15/14   Marcial Pacasimothy  Elmarie Mainland, MD  ferrous sulfate 325 (65 FE) MG tablet Take 1 tablet (325 mg total) by mouth 2 (two) times daily with a meal. 08/07/14   Renae Fickle, MD  furosemide (LASIX) 80 MG tablet Take 1 tablet (80 mg total) by mouth every 8 (eight) hours. 10/20/14   Richarda Overlie, MD  gabapentin (NEURONTIN) 300 MG capsule Take 1 capsule (300 mg total) by mouth daily. 08/18/14   Amy Michelle Nasuti, MD  metolazone (ZAROXOLYN) 5 MG tablet Take 1 tablet (5 mg total) by mouth daily as needed. 08/19/14   Antonieta Iba, MD  nystatin (MYCOSTATIN/NYSTOP) 100000 UNIT/GM POWD Apply to flank and groin and under breasts BID 10/20/14   Richarda Overlie, MD  OVER THE COUNTER MEDICATION Inhale 1 application into the lungs at bedtime. USES BIPAP EVERY BEDTIME FOR SLEEP    Historical Provider, MD  Pirfenidone (ESBRIET) 267 MG CAPS Take 1 tablet by mouth 3 (three) times daily.    Historical Provider, MD  potassium chloride SA (K-DUR,KLOR-CON) 20 MEQ tablet Take 20 mEq by mouth daily.    Historical Provider, MD  predniSONE (DELTASONE) 5 MG tablet Take 15 mg daily for one month Take 10 mg daily for one month Take 5 mg daily for one month 11/08/14   Lupita Leash, MD  warfarin (COUMADIN) 2.5 MG tablet Take as directed. 10/04/14   Amy Michelle Nasuti, MD   No Known Allergies  FAMILY HISTORY:  indicated that her mother is deceased. She indicated that her father is deceased.  SOCIAL HISTORY:  reports that she has never smoked. She has never used smokeless tobacco. She reports that she drinks alcohol. She reports that she does not use illicit drugs.  REVIEW OF SYSTEMS:   Gen: + fever, no chills, weight change, + fatigue, night sweats HEENT: Denies blurred vision, double vision, hearing loss, tinnitus, sinus congestion, rhinorrhea, sore throat, neck stiffness, dysphagia PULM: per HPI CV: per HPI GI: Denies abdominal pain, nausea, vomiting, diarrhea, hematochezia, melena, constipation, change in bowel habits GU: Denies dysuria, hematuria, polyuria, oliguria, urethral discharge Endocrine: Denies hot or cold intolerance, polyuria, polyphagia or appetite change Derm: Denies rash, dry skin, scaling or peeling skin change Heme: Denies easy bruising, bleeding, bleeding gums Neuro: Denies headache,  numbness, weakness, slurred speech, loss of memory or consciousness   SUBJECTIVE:   VITAL SIGNS: Temp:  [98.5 F (36.9 C)] 98.5 F (36.9 C) (07/06 1431) Pulse Rate:  [80] 80 (07/06 1431) BP: (124)/(72) 124/72 mmHg (07/06 1431) HEMODYNAMICS:   VENTILATOR SETTINGS:   INTAKE / OUTPUT: No intake or output data in the 24 hours ending 11/16/14 1523  PHYSICAL EXAMINATION: General:  Chronically ill appearing, mild respiratory distress Neuro:  Awake, alert, no distress HEENT:  NCAT, EOMI, oropharynx clear, sclera slightly injected Cardiovascular:  Regular rate and rhythm slight systolic murmur cannot assess JVD Lungs:  Crackles and wheezes in bases bilaterally, limited air movement, speaking in full sentences, mild increased respiratory effort Abdomen:  Bowel sounds positive nontender nondistended Musculoskeletal:  Normal bulk and tone Skin:  Chronic right leg wound well healed no oozing  LABS:  CBC No results for input(s): WBC, HGB, HCT, PLT in the last 168 hours. Coag's No results for input(s): APTT, INR in the last 168 hours. BMET No results for input(s): NA, K, CL, CO2, BUN, CREATININE, GLUCOSE in the last 168 hours. Electrolytes No results for input(s): CALCIUM, MG, PHOS in the last 168 hours. Sepsis Markers No results for input(s): LATICACIDVEN, PROCALCITON, O2SATVEN in the  last 168 hours. ABG No results for input(s): PHART, PCO2ART, PO2ART in the last 168 hours. Liver Enzymes No results for input(s): AST, ALT, ALKPHOS, BILITOT, ALBUMIN in the last 168 hours. Cardiac Enzymes No results for input(s): TROPONINI, PROBNP in the last 168 hours. Glucose No results for input(s): GLUCAP in the last 168 hours.  Imaging No results found.   ASSESSMENT / PLAN:  #1 Acute respiratory failure with hypoxemia: the most likely diagnosis is healthcare associated pneumonia considering the fever this morning and cough. Differential diagnosis includes acute pulmonary edema considering  her known diagnosis of congestive heart failure. However, she does not appear to be more volume overloaded than baseline. Differential also includes acute pneumonitisfrom pirfenidone but this seems less likely as this is not a commonly reported side effect of pirfenidone. >ad[facility] place orders for vancomycin and Zosyn > Chest x-ray > check procalcitonin > Check pro BNP > Check blood cultures > O2 as needed to maintain O2 saturation greater than 90% > Maintain prednisone 20 mg daily for now > Continue Lasix daily > Hold esbriet for now, will likely restart slowly after hospital discharge  #2 chronic congestive heart failure> As noted above this is not clearly an exacerbation of her heart failure > Check pro BNP > Check basic metabolic panel > Continue Congestive heart failure meds including diuretics with Zaroxolyn  #3 paroxysmal A. Fib > 12-lead EKG on admission > Continue diltiazem > Continue warfarin, check PT/INR  #4 History of recurrent DVT > continue warfarin > check PT/INR  #5 OSA/OHVS > BIPAP qHS  Code Status: Full, have discussed with patient prior.  Poor prognosis however, need to have ongoing discussion.  FAMILY  - Updates: Son updated in Pulmonary clinic 7/6  - Inter-disciplinary family meet or Palliative Care meeting due by: day 7  Heber McCook, MD  PCCM Pager: [phone] Cell: [phone] After 3pm or if no response, call [phone]   11/16/2014, 3:23 PM

## 2014-11-16 NOTE — Progress Notes (Signed)
Subjective:    Patient ID: [name] [name], female    DOB: 05/01/1932, 81 y.o.   MRN: [id number]  Synopsis: The Georgina PillionMassey is a 81 year old female who has a past medical history significant for congestive heart failure, pulmonary hypertension, pulmonary embolism, atrial fibrillation and obstructive sleep apnea. She previously been seen by Dr. Coralyn HellingVineet Sood for management of sleep apnea. She uses CPAP at 7 a 10 cm of water every night. At baseline she is on 2.5 L of oxygen continuously. 12/2012 CXR > Left lung scarring vs pneumonia  10/19 RHC >RA mean 3 RV 37/6 PA 37/15, mean 23 PCWP mean 7 Oxygen saturations: PA 75% AO 92% Cardiac Output(Fick) 8.94 Cardiac Index (Fick) 4.14 Cardiac Output (Thermo) 5.05 Cardiac Index (Thermo) 2.34  02/2014 CT chest high res> mild pulm edema, very mild scattered subpleural reticulation, but assessment limited by likely pulmonary edema; 4mm nodule 04/04/2014 CT high res [place]> patchy ground glass throughout with some subpleural reticulation, suspicious for NSIP, not UIP; mild air trapping, mild cardiomegally and atheroscloerosis  HPI Chief Complaint  Patient presents with  . Acute Visit    pt c/o prod cough with dark red blood since starting esbriet on Saturday, increased sob.     [name] [name] has not been doing well. She said that a week ago she started having a nosebleed which only lasted for one day. This was attributed to using oxygen without a humidifier. After that she felt okay, at baseline for her. However, on Saturday she took her first dose of the pirfenidone. Within 24 hours she started coughing, feeling weak, and producing black mucus. This eventually turned into a pink frothy mucus. She had a fever this morning to 101 and her family called EMS because she was gray in color. EMS came out and performed an EKG and vital signs.  Her leg swelling has been at baseline recently.  She denies chest pain. She took  Tylenol today. She has not had abdominal pain nausea vomiting or  diarrhea.   Past Medical History  Diagnosis Date  . Congestive heart failure, unspecified     a. ECHO 6/0: EF 55-60%, mild LVH, mildly dilated RV w. mildly dec fx, RVSP 67  . Chronic atrial fibrillation     failed cardioversion in past  -repeat DC-CV on October 5th, 2010  . Personal history of DVT (deep vein thrombosis) 2008    LLE  . HTN (hypertension)   . Morbid obesity   . Shingles     with postherpetic neuraigia  . GERD (gastroesophageal reflux disease)   . Hyperlipidemia   . Meningitis ~ 1960    [facility]  . Gallstones     s/p cholecystectomy  . CAD (coronary artery disease)     nonobstructive by cath 8/10 - LAD 40-50% w mild PAH mean 29 w PVR 3.2 [place]  . PE (pulmonary embolism) ~ 2010    LLE  . Shortness of breath dyspnea   . Lung interstitial disease   . Hypoxemia   . COPD (chronic obstructive pulmonary disease)   . On home oxygen therapy     "5L; 24/7" (10/11/2014)  . OSA treated with BiPAP     "I think it's BiPAP"  . AAA (abdominal aortic aneurysm)     small  . History of blood transfusion   . Anemia   . Iron deficiency anemia   . History of blood transfusion     "related to nose bleed"  . Gout   . Depression   .  Chronic kidney disease (CKD), stage II (mild)       Review of Systems  Constitutional: Positive for fatigue. Negative for fever, chills and diaphoresis.  HENT: Negative for congestion, postnasal drip, rhinorrhea and sinus pressure.   Respiratory: Positive for shortness of breath. Negative for choking, chest tightness and wheezing.   Cardiovascular: Positive for leg swelling. Negative for chest pain.       Objective:   Physical Exam  Filed Vitals:   11/16/14 1431  BP: 124/72  Pulse: 80  Temp: 98.5 F (36.9 C)  TempSrc: Oral  6 L [place] Her oxygen was increased to 8 L nasal cannula and her O2 saturation improved to 90%  Gen: chronically ill appearing, wheelchair bound, mild respiratory distress HENT: OP clear, TM's  clear, neck supple PULM: Crackles bases B, wheezing bilaterally, speaking in full sentences no accessory muscle use CV: RRR, no mgr, massive edemaunchanged from baseline GI: BS+, soft, nontender Derm: chronic scar right leg unchanged from prior,no cyanosis Psyche: normal mood and affect      12/2012 CXR > Left lung scarring vs pneumonia  10/19 RHC >RA mean 3 RV 37/6 PA 37/15, mean 23 PCWP mean 7 Oxygen saturations: PA 75% AO 92% Cardiac Output(Fick) 8.94 Cardiac Index (Fick) 4.14 Cardiac Output (Thermo) 5.05 Cardiac Index (Thermo) 2.34  02/2014 CT chest high res> mild pulm edema, very mild scattered subpleural reticulation, but assessment limited by likely pulmonary edema; 4mm nodule 07/2014 HRCT > persistent ggo bilaterally, mild pleural effusion, scattered interstitial thickening, no major change from prior study    Assessment & Plan:  #1 Acute respiratory failure with hypoxemia: the most likely diagnosis is healthcare associated pneumonia considering the fever this morning and cough. Differential diagnosis includes acute pulmonary edema considering her known diagnosis of congestive heart failure. However, she does not appear to be more volume overloaded than baseline. Differential also includes acute pneumonitisfrom pirfenidone but this seems less likely as this is not a commonly reported side effect of pirfenidone. >admit to stepdown at [facility] > Will place orders for vancomycin and Zosyn > Chest x-ray > check procalcitonin > Check pro BNP > Check blood cultures > O2 as needed to maintain O2 saturation greater than 90% > Maintain prednisone 20 mg daily for now > Continue Lasix daily > Hold esbriet for now, will likely restart slowly after hospital discharge  #2 chronic congestive heart failure> As noted above this is not clearly an exacerbation of her heart failure > Check pro BNP > Check basic metabolic panel > Continue  Congestive heart failure meds including diuretics  with Zaroxolyn  #3 paroxysmal A. Fib > 12-lead EKG on admission > Continue diltiazem > Continue warfarin, check PT/INR  #4 History of recurrent DVT > continue warfarin > check PT/INR  #5 OSA/OHVS > BIPAP qHS  Updated Medication List Outpatient Encounter Prescriptions as of 11/16/2014  Medication Sig  . albuterol (PROVENTIL) (2.5 MG/3ML) 0.083% nebulizer solution Take 3 mLs (2.5 mg total) by nebulization every 2 (two) hours as needed for wheezing or shortness of breath.  . allopurinol (ZYLOPRIM) 300 MG tablet Take 1 tablet (300 mg total) by mouth daily.  [name] aspirin 81 MG tablet Take 81 mg by mouth daily.  [name] atorvastatin (LIPITOR) 10 MG tablet Take 1 tablet (10 mg total) by mouth daily.  . budesonide (PULMICORT) 0.25 MG/2ML nebulizer solution Take 0.25 mg by nebulization 2 (two) times daily.  . cetirizine (ZYRTEC ALLERGY) 10 MG tablet Take 10 mg by mouth as needed for  allergies.   [name] colchicine 0.6 MG tablet Take 0.6 mg by mouth as needed (for flareup).   [name] diltiazem (CARDIZEM CD) 240 MG 24 hr capsule Take 1 capsule (240 mg total) by mouth daily.  [name] escitalopram (LEXAPRO) 20 MG tablet Take 1 tablet (20 mg total) by mouth daily.  . famotidine (PEPCID) 20 MG tablet Take 1 tablet (20 mg total) by mouth daily.  . ferrous sulfate 325 (65 FE) MG tablet Take 1 tablet (325 mg total) by mouth 2 (two) times daily with a meal.  . furosemide (LASIX) 80 MG tablet Take 1 tablet (80 mg total) by mouth every 8 (eight) hours.  . gabapentin (NEURONTIN) 300 MG capsule Take 1 capsule (300 mg total) by mouth daily.  . metolazone (ZAROXOLYN) 5 MG tablet Take 1 tablet (5 mg total) by mouth daily as needed.  . nystatin (MYCOSTATIN/NYSTOP) 100000 UNIT/GM POWD Apply to flank and groin and under breasts BID  . OVER THE COUNTER MEDICATION Inhale 1 application into the lungs at bedtime. USES BIPAP EVERY BEDTIME FOR SLEEP  . Pirfenidone (ESBRIET) 267 MG CAPS Take 1 tablet by mouth 3 (three) times daily.  . potassium  chloride SA (K-DUR,KLOR-CON) 20 MEQ tablet Take 20 mEq by mouth daily.  . predniSONE (DELTASONE) 5 MG tablet Take 15 mg daily for one month Take 10 mg daily for one month Take 5 mg daily for one month  . warfarin (COUMADIN) 2.5 MG tablet Take as directed.   No facility-administered encounter medications on file as of 11/16/2014.

## 2014-11-17 ENCOUNTER — Inpatient Hospital Stay (HOSPITAL_COMMUNITY): Payer: Medicare Other

## 2014-11-17 DIAGNOSIS — R0600 Dyspnea, unspecified: Secondary | ICD-10-CM

## 2014-11-17 DIAGNOSIS — I5032 Chronic diastolic (congestive) heart failure: Secondary | ICD-10-CM

## 2014-11-17 DIAGNOSIS — G4733 Obstructive sleep apnea (adult) (pediatric): Secondary | ICD-10-CM

## 2014-11-17 LAB — BASIC METABOLIC PANEL
ANION GAP: 9 (ref 5–15)
BUN: 18 mg/dL (ref 6–20)
CO2: 37 mmol/L — ABNORMAL HIGH (ref 22–32)
Calcium: 8.9 mg/dL (ref 8.9–10.3)
Chloride: 98 mmol/L — ABNORMAL LOW (ref 101–111)
Creatinine, Ser: 1.19 mg/dL — ABNORMAL HIGH (ref 0.44–1.00)
GFR, EST AFRICAN AMERICAN: 48 mL/min — AB (ref >60)
GFR, EST NON AFRICAN AMERICAN: 42 mL/min — AB (ref >60)
Glucose, Bld: 130 mg/dL — ABNORMAL HIGH (ref 65–99)
POTASSIUM: 3.2 mmol/L — AB (ref 3.5–5.1)
SODIUM: 144 mmol/L (ref 135–145)

## 2014-11-17 LAB — CBC
HCT: 44.6 % (ref 36.0–46.0)
HEMOGLOBIN: 14.4 g/dL (ref 12.0–15.0)
MCH: 33.1 pg (ref 26.0–34.0)
MCHC: 32.3 g/dL (ref 30.0–36.0)
MCV: 102.5 fL — AB (ref 78.0–100.0)
PLATELETS: 160 10*3/uL (ref 150–400)
RBC: 4.35 MIL/uL (ref 3.87–5.11)
RDW: 18.2 % — ABNORMAL HIGH (ref 11.5–15.5)
WBC: 12.6 10*3/uL — ABNORMAL HIGH (ref 4.0–10.5)

## 2014-11-17 LAB — LEGIONELLA ANTIGEN, URINE

## 2014-11-17 LAB — TROPONIN I
TROPONIN I: 0.04 ng/mL — AB (ref <0.031)
Troponin I: 0.03 ng/mL (ref <0.031)

## 2014-11-17 LAB — PROTIME-INR
INR: 2.87 — ABNORMAL HIGH (ref 0.00–1.49)
Prothrombin Time: 29.6 seconds — ABNORMAL HIGH (ref 11.6–15.2)

## 2014-11-17 MED ORDER — METHYLPREDNISOLONE SODIUM SUCC 125 MG IJ SOLR
80.0000 mg | Freq: Two times a day (BID) | INTRAMUSCULAR | Status: DC
  Administered 2014-11-17 – 2014-11-21 (×9): 80 mg via INTRAVENOUS
  Filled 2014-11-17: qty 2
  Filled 2014-11-17: qty 1.28
  Filled 2014-11-17: qty 2
  Filled 2014-11-17 (×6): qty 1.28
  Filled 2014-11-17: qty 2
  Filled 2014-11-17: qty 1.28

## 2014-11-17 MED ORDER — POTASSIUM CHLORIDE CRYS ER 20 MEQ PO TBCR
40.0000 meq | EXTENDED_RELEASE_TABLET | Freq: Once | ORAL | Status: AC
  Administered 2014-11-17: 40 meq via ORAL
  Filled 2014-11-17: qty 2

## 2014-11-17 MED ORDER — WARFARIN - PHARMACIST DOSING INPATIENT
Freq: Every day | Status: DC
  Administered 2014-11-18: 18:00:00

## 2014-11-17 MED ORDER — CHLORHEXIDINE GLUCONATE 0.12 % MT SOLN
15.0000 mL | Freq: Two times a day (BID) | OROMUCOSAL | Status: DC
  Administered 2014-11-17 – 2014-11-24 (×16): 15 mL via OROMUCOSAL
  Filled 2014-11-17 (×15): qty 15

## 2014-11-17 MED ORDER — WARFARIN SODIUM 1 MG PO TABS
1.0000 mg | ORAL_TABLET | Freq: Once | ORAL | Status: AC
  Administered 2014-11-17: 1 mg via ORAL
  Filled 2014-11-17: qty 1

## 2014-11-17 MED ORDER — FUROSEMIDE 10 MG/ML IJ SOLN
40.0000 mg | Freq: Four times a day (QID) | INTRAMUSCULAR | Status: AC
  Administered 2014-11-17 (×2): 40 mg via INTRAVENOUS
  Filled 2014-11-17 (×2): qty 4

## 2014-11-17 MED ORDER — CETYLPYRIDINIUM CHLORIDE 0.05 % MT LIQD
7.0000 mL | Freq: Two times a day (BID) | OROMUCOSAL | Status: DC
  Administered 2014-11-17 – 2014-11-20 (×8): 7 mL via OROMUCOSAL

## 2014-11-17 NOTE — Progress Notes (Signed)
E link called to report a K of 3.2.  RN states that she will report the value to the E link MD

## 2014-11-17 NOTE — Progress Notes (Signed)
PULMONARY / CRITICAL CARE MEDICINE   Name: [name] [name] MRN: [id number] DOB: 05/10/1932    ADMISSION DATE:  11/16/2014 CONSULTATION DATE:  11/16/2014  REFERRING MD :  Kendrick Fries, pulmonary office  CHIEF COMPLAINT:  Shortness of breath  INITIAL PRESENTATION: 81 year old female with a past medical history significant for pulmonary fibrosis believed to be usual interstitial pneumonitis as well as diastolic heart failure was admitted from the pulmonary office on 11/16/2014 with a fever, cough, and worsening hypoxemic respiratory failure.  STUDIES:    SIGNIFICANT EVENTS:   SUBJECTIVE: More hypoxemic overnight, more short of breath  VITAL SIGNS: Temp:  [98 F (36.7 C)-98.8 F (37.1 C)] 98.5 F (36.9 C) (07/07 0747) Pulse Rate:  [63-80] 66 (07/07 0852) Resp:  [20-28] 23 (07/07 0852) BP: (124-153)/(53-72) 145/68 mmHg (07/07 0747) SpO2:  [85 %-96 %] 86 % (07/07 0855) FiO2 (%):  [50 %-91 %] 50 % (07/07 0852) Weight:  [290 lb 3.2 oz (131.634 kg)-292 lb 1.6 oz (132.496 kg)] 292 lb 1.6 oz (132.496 kg) (07/07 0443) HEMODYNAMICS:   VENTILATOR SETTINGS: Vent Mode:  [-] BIPAP FiO2 (%):  [50 %-91 %] 50 % PEEP:  [7 cmH20] 7 cmH20 Pressure Support:  [7 cmH20] 7 cmH20 INTAKE / OUTPUT:  Intake/Output Summary (Last 24 hours) at 11/17/14 1046 Last data filed at 11/17/14 0600  Gross per 24 hour  Intake    600 ml  Output   1100 ml  Net   -500 ml    PHYSICAL EXAMINATION: General:  In bed, mild respiratory distress Neuro:  Awake, alert, no distress HEENT:  NCAT, EOMI, oropharynx clear, sclera slightly injected Cardiovascular:  Regular rate and rhythm slight systolic murmur cannot assess JVD Lungs:  More wheezing today, crackles bilaterally Abdomen:  Bowel sounds positive nontender nondistended Musculoskeletal:  Normal bulk and tone Skin:  Chronic right leg wound well healed no oozing  LABS:  CBC  Recent Labs Lab 11/16/14 1851 11/17/14 0945  WBC 13.2* 12.6*  HGB 14.1 14.4  HCT  44.4 44.6  PLT 157 160   Coag's  Recent Labs Lab 11/16/14 1851 11/17/14 0945  INR 3.28* 2.87*   BMET  Recent Labs Lab 11/16/14 1851  NA 139  K 4.4  CL 96*  CO2 33*  BUN 23*  CREATININE 1.25*  GLUCOSE 118*   Electrolytes  Recent Labs Lab 11/16/14 1851  CALCIUM 8.9  MG 2.4  PHOS 3.2   Sepsis Markers  Recent Labs Lab 11/16/14 1851  PROCALCITON 0.28   ABG No results for input(s): PHART, PCO2ART, PO2ART in the last 168 hours. Liver Enzymes  Recent Labs Lab 11/16/14 1851  AST 34  ALT 35  ALKPHOS 51  BILITOT 0.6  ALBUMIN 3.2*   Cardiac Enzymes  Recent Labs Lab 11/16/14 1851 11/16/14 2328  TROPONINI 0.04* 0.04*   Glucose No results for input(s): GLUCAP in the last 168 hours.  Imaging  7/7 CXR images reviewed> diffuse hazy opacities bilaterally, pleural thickening, no definite infiltrate  ASSESSMENT / PLAN:  #1 Acute respiratory failure with hypoxemia: has severe, end stage ILD and now either has acute flare (pneumonitis) vs less likely pneumonia.  Despite known diagnosis of CHF she has less swelling now than she has had in months.   Suspect acute inflammatory pneumonitis > continue HCAP coverage, would wean off/stop on 7/8 if remains afebrile > add solumedrol  IV q8h > change diuretics to IV today (though favor pneumonitis), adjust tomorrow > use NRB to titrate O2 sat > 85% > BIPAP qHS and  PRN > incentive spirometry  #2 Chronic congestive heart failure> As noted above this is not clearly an exacerbation of her heart failure > follow daily basic metabolic panel > ContinueZaroxolyn > continue diuretics> change to IV today, adjust lasix 7/8 > check echo  #3 paroxysmal A. Fib > Continue diltiazem > Continue warfarin  #4 History of recurrent DVT > continue warfarin > check PT/INR  #5 OSA/OHVS > BIPAP qHS  Code Status: Full.  I discussed this with her today and explained that life support (mechanical ventilation and CPR) would do  more harm than good considering her overall poor prognosis.  She refuses DNR as she states that her 391 y/o mother had a remarkable recovery in the ICU.  I discussed with her son Harvie HeckRandy and he understands this is a terminal illness and agrees with me that she should not be full code.  Will continue conversations daily.  FAMILY  - Updates: Son updated on phone 7/7  - Inter-disciplinary family meet or Palliative Care meeting due by: day 7  Heber CarolinaBrent McQuaid, MD [place] PCCM Pager: [phone] Cell: [phone] After 3pm or if no response, call [phone]   11/17/2014, 10:46 AM

## 2014-11-17 NOTE — Progress Notes (Signed)
Soon as pt is off NRB mask and placed on aerosol neb mask for her scheduled treatment she desats to low 70s and goes into mild respiratory distress. RN aware. NRB mask is reapplied after treatment and pt O2 saturation is now 94% post treatment and is now stable.

## 2014-11-17 NOTE — Progress Notes (Signed)
  Echocardiogram 2D Echocardiogram has been performed.  Janalyn HarderWest, Terilynn Buresh R 11/17/2014, 3:52 PM

## 2014-11-17 NOTE — Progress Notes (Addendum)
ANTICOAGULATION CONSULT NOTE - Follow Up Consult  Pharmacy Consult for coumadin  Indication: hx dvt/pe, afib  Labs:  Recent Labs  11/16/14 1851 11/16/14 2328 11/17/14 0945 11/17/14 1231  HGB 14.1  --  14.4  --   HCT 44.4  --  44.6  --   PLT 157  --  160  --   LABPROT 32.7*  --  29.6*  --   INR 3.28*  --  2.87*  --   CREATININE 1.25*  --   --  1.19*  TROPONINI 0.04* 0.04*  --  0.03    Estimated Creatinine Clearance: 50.2 mL/min (by C-G formula based on Cr of 1.19).   Assessment: 81 yo F admitted from pulmonary MD office with fever, cough, and worsening hypoxemic respiratory failure.  Patient with history of afib and DVT/PE on chronic coumadin PTA. INR elevated last night but is now down to 2.8 this am, dose held 7/6.  PTA coumadin dose is 3.75mg  daily  Goal of Therapy:  Per outpatient records, INR goal 1.8-2.2 Monitor platelets by anticoagulation protocol: Yes   Plan:  Warfarin 1mg  tonight Daily INR  Sheppard CoilFrank Mariella Blackwelder PharmD., BCPS Clinical Pharmacist Pager [phone] 11/17/2014 2:57 PM

## 2014-11-17 NOTE — Progress Notes (Signed)
[facility]  Patient Status: Active (receiving services up to time of hospitalization)  [name] is providing the following services: RN and PT  If patient discharges after hours, please call [phone].   [name] FurnishDonna [name] 11/17/2014, 10:10 AM

## 2014-11-18 LAB — RESPIRATORY VIRUS PANEL
Adenovirus: NEGATIVE
INFLUENZA A: NEGATIVE
INFLUENZA B 1: NEGATIVE
Metapneumovirus: NEGATIVE
Parainfluenza 1: NEGATIVE
Parainfluenza 2: NEGATIVE
Parainfluenza 3: POSITIVE — AB
RESPIRATORY SYNCYTIAL VIRUS A: NEGATIVE
RESPIRATORY SYNCYTIAL VIRUS B: NEGATIVE
Rhinovirus: NEGATIVE

## 2014-11-18 LAB — PROTIME-INR
INR: 2.67 — ABNORMAL HIGH (ref 0.00–1.49)
Prothrombin Time: 28 seconds — ABNORMAL HIGH (ref 11.6–15.2)

## 2014-11-18 LAB — BASIC METABOLIC PANEL
ANION GAP: 10 (ref 5–15)
BUN: 19 mg/dL (ref 6–20)
CO2: 42 mmol/L — ABNORMAL HIGH (ref 22–32)
Calcium: 9 mg/dL (ref 8.9–10.3)
Chloride: 91 mmol/L — ABNORMAL LOW (ref 101–111)
Creatinine, Ser: 1.17 mg/dL — ABNORMAL HIGH (ref 0.44–1.00)
GFR calc non Af Amer: 43 mL/min — ABNORMAL LOW (ref >60)
GFR, EST AFRICAN AMERICAN: 49 mL/min — AB (ref >60)
Glucose, Bld: 131 mg/dL — ABNORMAL HIGH (ref 65–99)
POTASSIUM: 2.9 mmol/L — AB (ref 3.5–5.1)
Sodium: 143 mmol/L (ref 135–145)

## 2014-11-18 MED ORDER — POTASSIUM CHLORIDE CRYS ER 20 MEQ PO TBCR
40.0000 meq | EXTENDED_RELEASE_TABLET | ORAL | Status: AC
  Administered 2014-11-18 (×2): 40 meq via ORAL
  Filled 2014-11-18 (×2): qty 2

## 2014-11-18 MED ORDER — FUROSEMIDE 10 MG/ML IJ SOLN
60.0000 mg | Freq: Four times a day (QID) | INTRAMUSCULAR | Status: AC
  Administered 2014-11-18 (×2): 60 mg via INTRAVENOUS
  Filled 2014-11-18 (×2): qty 6

## 2014-11-18 MED ORDER — WARFARIN SODIUM 2 MG PO TABS
2.0000 mg | ORAL_TABLET | Freq: Once | ORAL | Status: AC
  Administered 2014-11-18: 2 mg via ORAL
  Filled 2014-11-18: qty 1

## 2014-11-18 NOTE — Progress Notes (Signed)
PULMONARY / CRITICAL CARE MEDICINE   Name: [name] [name] MRN: [id number] DOB: 10/03/1931    ADMISSION DATE:  11/16/2014 CONSULTATION DATE:  11/16/2014  REFERRING MD :  [name], pulmonary office  CHIEF COMPLAINT:  Shortness of breath  INITIAL PRESENTATION: 81 year old female with a past medical history significant for pulmonary fibrosis believed to be usual interstitial pneumonitis as well as diastolic heart failure was admitted from the pulmonary office on 11/16/2014 with a fever, cough, and worsening hypoxemic respiratory failure.  STUDIES:  7/7 echo 60%, pa 55, RV appears dilated and hypertrophied On parasternal images ? mass in RV but I think this is just a tangential cut of thickened RV. Can consider f/u MRI or TEE if clinically indicated   SIGNIFICANT EVENTS: 7/8- improved resp status  SUBJECTIVE: in chair, less WOB  VITAL SIGNS: Temp:  [97.6 F (36.4 C)-98.6 F (37 C)] 98.6 F (37 C) (07/08 1111) Pulse Rate:  [63-72] 71 (07/08 1111) Resp:  [17-26] 25 (07/08 1111) BP: (127-160)/(55-73) 144/55 mmHg (07/08 1111) SpO2:  [91 %-96 %] 96 % (07/08 1111) FiO2 (%):  [60 %-100 %] 60 % (07/08 0755) Weight:  [130.591 kg (287 lb 14.4 oz)] 130.591 kg (287 lb 14.4 oz) (07/08 0300) HEMODYNAMICS:   VENTILATOR SETTINGS: Vent Mode:  [-] BIPAP FiO2 (%):  [60 %-100 %] 60 % Set Rate:  [12 bmp] 12 bmp PEEP:  [8 cmH20] 8 cmH20 Pressure Support:  [6 cmH20] 6 cmH20 INTAKE / OUTPUT:  Intake/Output Summary (Last 24 hours) at 11/18/14 1217 Last data filed at 11/18/14 [id number]  Gross per 24 hour  Intake    890 ml  Output    800 ml  Net     90 ml    PHYSICAL EXAMINATION: General:  In bed, no respiratory distress Neuro:  Awake, alert, no distress HEENT:  Obese neck Cardiovascular:s1 s2 RRR no r Lungs:  crackles bilaterally Abdomen:  Bowel sounds positive nontender nondistended Musculoskeletal:  Normal bulk and tone Skin:  Chronic right leg wound well healed no  oozing  LABS:  CBC  Recent Labs Lab 11/16/14 1851 11/17/14 0945  WBC 13.2* 12.6*  HGB 14.1 14.4  HCT 44.4 44.6  PLT 157 160   Coag's  Recent Labs Lab 11/16/14 1851 11/17/14 0945 11/18/14 0320  INR 3.28* 2.87* 2.67*   BMET  Recent Labs Lab 11/16/14 1851 11/17/14 1231 11/18/14 0320  NA 139 144 143  K 4.4 3.2* 2.9*  CL 96* 98* 91*  CO2 33* 37* 42*  BUN 23* 18 19  CREATININE 1.25* 1.19* 1.17*  GLUCOSE 118* 130* 131*   Electrolytes  Recent Labs Lab 11/16/14 1851 11/17/14 1231 11/18/14 0320  CALCIUM 8.9 8.9 9.0  MG 2.4  --   --   PHOS 3.2  --   --    Sepsis Markers  Recent Labs Lab 11/16/14 1851  PROCALCITON 0.28   ABG No results for input(s): PHART, PCO2ART, PO2ART in the last 168 hours. Liver Enzymes  Recent Labs Lab 11/16/14 1851  AST 34  ALT 35  ALKPHOS 51  BILITOT 0.6  ALBUMIN 3.2*   Cardiac Enzymes  Recent Labs Lab 11/16/14 1851 11/16/14 2328 11/17/14 1231  TROPONINI 0.04* 0.04* 0.03   Glucose No results for input(s): GLUCAP in the last 168 hours.  Imaging  7/7 CXR images reviewed> diffuse hazy opacities bilaterally, pleural thickening, no definite infiltrate  ASSESSMENT / PLAN:  #1 Acute respiratory failure with hypoxemia: has severe, end stage ILD and now either has acute flare (  pneumonitis) vs less likely pneumonia.  Despite known diagnosis of CHF she has less swelling now than she has had in months.   Suspect acute inflammatory pneumonitis >unlikley PNA with such rapid resolution, dc abx > keep solumedrol  IV q8h as made clinical progress thus far > improved clinically with lasix, although last 24 hr was pos 600 cc, increase > use NRB to titrate O2 sat > 85%, add high flow Nasal cannula > BIPAP qHS and PRN - NOT required with no WOB in chair > incentive spirometry  #2 Chronic congestive heart failure> As noted above this is not clearly an exacerbation of her heart failure > follow daily basic metabolic panel >  ContinueZaroxolyn > continue diuretics> change to IV today, adjust lasix 7/8 > echo reviewed, no need to image for mass at this stage, would not tolerate MRI or TEE at this stage  #3 paroxysmal A. Fib > Continue diltiazem > Continue warfarin  #4 History of recurrent DVT > continue warfarin  #5 OSA/OHVS > BIPAP qHS, but not daily  Code Status: Full. Per discussions thus far with Dr BQ  FAMILY  - Updates: Son updated on phone 7/7  - Inter-disciplinary family meet or Palliative Care meeting due by: day 7  Mcarthur Rossetti. Tyson Alias, MD, FACP Pgr: [phone] [facility]

## 2014-11-18 NOTE — Care Management Note (Signed)
Case Management Note  Patient Details  Name: [name] [name] MRN: [id number] Date of Birth: 08/10/1931  Subjective/Objective:    Pt lives with son, is active with [facility] for RN, PT services.  [facility] has attempted to follow in the past but pt refused.  Discussed services with pt, she states she does not need anything other than home health.  [facility] when home care RN and PT were no longer visiting - pt continues to decline their services.                             Expected Discharge Plan:  Home w Home Health Services  Discharge planning Services  CM Consult  Post Acute Care Choice:  Resumption of Svcs/PTA Provider  Status of Service:  In process, will continue to follow  Medicare Important Message Given:  Yes-second notification given    11/18/2014, 2:29 PM

## 2014-11-18 NOTE — Progress Notes (Signed)
ANTICOAGULATION CONSULT NOTE - Follow Up Consult  Pharmacy Consult for coumadin  Indication: hx dvt/pe, afib  Labs:  Recent Labs  11/16/14 1851 11/16/14 2328 11/17/14 0945 11/17/14 1231 11/18/14 0320  HGB 14.1  --  14.4  --   --   HCT 44.4  --  44.6  --   --   PLT 157  --  160  --   --   LABPROT 32.7*  --  29.6*  --  28.0*  INR 3.28*  --  2.87*  --  2.67*  CREATININE 1.25*  --   --  1.19* 1.17*  TROPONINI 0.04* 0.04*  --  0.03  --     Estimated Creatinine Clearance: 50.7 mL/min (by C-G formula based on Cr of 1.17).   Assessment: 81 yo F admitted 11/16/2014   from pulmonary MD office with fever, cough, and worsening hypoxemic respiratory failure.    PMH: pulmonary fibrosis, HF, Afib, hx DVT (2008) and PE (2010), CAD, HLD, HTN, morbid obesity, shingles, GERD, COPD on home O2, Anemia, Gout, Depression  Anticoagulation: hx afib, DVT/PE.   INR at goal, dose held 7/6 PTA: Of note, pt on Coumadin PTA for hx afib, DVT, PE.  PTA dose was 3.75mg  daily (1.5 tabs of 2.5mg ) with last INR 3.2 on 11/07/14. ; Per outpatient records, INR goal 1.8-2.2  Goal of Therapy:  Per outpatient records, INR goal 1.8-2.2 Monitor platelets by anticoagulation protocol: Yes   Plan:  Warfarin 2 mg tonight Daily INR  Thank you for allowing pharmacy to be a part of this patients care team.  Lovenia KimJulie Rollie Hynek Pharm.D., BCPS, AQ-Cardiology Clinical Pharmacist 11/18/2014 12:37 PM Pager: [phone] Phone: [phone]) [phone]

## 2014-11-18 NOTE — Progress Notes (Signed)
eLink Physician-Brief Progress Note Patient Name: [name] [name] DOB: 05/15/1931 MRN: [id number]   Date of Service  11/18/2014  HPI/Events of Note  Hypokalemia - already on standing dose of 20 mEq daily  eICU Interventions  Potassium replaced     Intervention Category Major Interventions: Electrolyte abnormality - evaluation and management  DETERDING,ELIZABETH 11/18/2014, 5:06 AM

## 2014-11-18 NOTE — Care Management (Signed)
Important Message  Patient Details  Name: [name] [name] MRN: [id number] Date of Birth: 10/28/1931   Medicare Important Message Given:  Yes-second notification given    Magdalene RiverMayo, Adreonna Yontz T, RN 11/18/2014, 11:18 AM

## 2014-11-19 ENCOUNTER — Inpatient Hospital Stay (HOSPITAL_COMMUNITY): Payer: Medicare Other

## 2014-11-19 DIAGNOSIS — J9621 Acute and chronic respiratory failure with hypoxia: Secondary | ICD-10-CM

## 2014-11-19 LAB — COMPREHENSIVE METABOLIC PANEL
ALT: 44 U/L (ref 14–54)
AST: 41 U/L (ref 15–41)
Albumin: 3 g/dL — ABNORMAL LOW (ref 3.5–5.0)
Alkaline Phosphatase: 48 U/L (ref 38–126)
Anion gap: 14 (ref 5–15)
BILIRUBIN TOTAL: 0.7 mg/dL (ref 0.3–1.2)
BUN: 38 mg/dL — ABNORMAL HIGH (ref 6–20)
CALCIUM: 9.6 mg/dL (ref 8.9–10.3)
CO2: 40 mmol/L — AB (ref 22–32)
Chloride: 90 mmol/L — ABNORMAL LOW (ref 101–111)
Creatinine, Ser: 1.45 mg/dL — ABNORMAL HIGH (ref 0.44–1.00)
GFR calc Af Amer: 38 mL/min — ABNORMAL LOW (ref >60)
GFR, EST NON AFRICAN AMERICAN: 33 mL/min — AB (ref >60)
GLUCOSE: 155 mg/dL — AB (ref 65–99)
Potassium: 2.9 mmol/L — ABNORMAL LOW (ref 3.5–5.1)
SODIUM: 144 mmol/L (ref 135–145)
Total Protein: 6.1 g/dL — ABNORMAL LOW (ref 6.5–8.1)

## 2014-11-19 LAB — CBC WITH DIFFERENTIAL/PLATELET
BASOS PCT: 0 % (ref 0–1)
Basophils Absolute: 0 10*3/uL (ref 0.0–0.1)
EOS PCT: 0 % (ref 0–5)
Eosinophils Absolute: 0 10*3/uL (ref 0.0–0.7)
HEMATOCRIT: 44.6 % (ref 36.0–46.0)
Hemoglobin: 14.1 g/dL (ref 12.0–15.0)
Lymphocytes Relative: 3 % — ABNORMAL LOW (ref 12–46)
Lymphs Abs: 0.5 10*3/uL — ABNORMAL LOW (ref 0.7–4.0)
MCH: 32.5 pg (ref 26.0–34.0)
MCHC: 31.6 g/dL (ref 30.0–36.0)
MCV: 102.8 fL — AB (ref 78.0–100.0)
MONO ABS: 0.7 10*3/uL (ref 0.1–1.0)
Monocytes Relative: 5 % (ref 3–12)
NEUTROS ABS: 13.9 10*3/uL — AB (ref 1.7–7.7)
Neutrophils Relative %: 92 % — ABNORMAL HIGH (ref 43–77)
Platelets: 200 10*3/uL (ref 150–400)
RBC: 4.34 MIL/uL (ref 3.87–5.11)
RDW: 17.1 % — ABNORMAL HIGH (ref 11.5–15.5)
WBC: 15 10*3/uL — ABNORMAL HIGH (ref 4.0–10.5)

## 2014-11-19 LAB — PROTIME-INR
INR: 2 — ABNORMAL HIGH (ref 0.00–1.49)
Prothrombin Time: 22.5 seconds — ABNORMAL HIGH (ref 11.6–15.2)

## 2014-11-19 MED ORDER — WARFARIN SODIUM 4 MG PO TABS
4.0000 mg | ORAL_TABLET | Freq: Once | ORAL | Status: AC
  Administered 2014-11-19: 4 mg via ORAL
  Filled 2014-11-19: qty 1

## 2014-11-19 MED ORDER — WHITE PETROLATUM GEL
Status: AC
  Administered 2014-11-19: 1
  Filled 2014-11-19: qty 1

## 2014-11-19 MED ORDER — POTASSIUM CHLORIDE CRYS ER 20 MEQ PO TBCR
40.0000 meq | EXTENDED_RELEASE_TABLET | ORAL | Status: AC
  Administered 2014-11-19 (×2): 40 meq via ORAL
  Filled 2014-11-19 (×2): qty 2

## 2014-11-19 NOTE — Progress Notes (Signed)
ANTICOAGULATION CONSULT NOTE - Follow Up Consult  Pharmacy Consult for coumadin  Indication: hx dvt/pe, afib  Labs:  Recent Labs  11/16/14 1851 11/16/14 2328 11/17/14 0945 11/17/14 1231 11/18/14 0320 11/19/14 0216  HGB 14.1  --  14.4  --   --  14.1  HCT 44.4  --  44.6  --   --  44.6  PLT 157  --  160  --   --  200  LABPROT 32.7*  --  29.6*  --  28.0* 22.5*  INR 3.28*  --  2.87*  --  2.67* 2.00*  CREATININE 1.25*  --   --  1.19* 1.17* 1.45*  TROPONINI 0.04* 0.04*  --  0.03  --   --     Estimated Creatinine Clearance: 40.4 mL/min (by C-G formula based on Cr of 1.45).   Assessment: 81 yo F admitted 11/16/2014   from pulmonary MD office with fever, cough, and worsening hypoxemic respiratory failure.    PMH: pulmonary fibrosis, HF, Afib, hx DVT (2008) and PE (2010), CAD, HLD, HTN, morbid obesity, shingles, GERD, COPD on home O2, Anemia, Gout, Depression  Anticoagulation: hx afib, DVT/PE.   INR at goal, dose held 7/6 PTA: Of note, pt on Coumadin PTA for hx afib, DVT, PE.  PTA dose was 3.75mg  daily (1.5 tabs of 2.5mg ) with last INR 3.2 on 11/07/14. ; Per outpatient records, INR goal 1.8-2.2  Goal of Therapy:  Per outpatient records, INR goal 1.8-2.2 Monitor platelets by anticoagulation protocol: Yes   Plan:  Warfarin 4 mg tonight Daily INR  Thank you for allowing pharmacy to be a part of this patients care team.  Lovenia KimJulie Lonnette Shrode Pharm.D., BCPS, AQ-Cardiology Clinical Pharmacist 11/19/2014 1:41 PM Pager: [phone]) [phone] Phone: [phone]) [phone]

## 2014-11-19 NOTE — Progress Notes (Signed)
RN explained CCM is wanting patient to try High Flow [place] during day while off BIPAP. Request was made yesterday. However there were no HFNC units available. Pt wore 6L [place] W/ NRB. Charge RT has been made aware of this tonight and had no luck locating a unit not in use. Request will be forwarded to dayshift charge.

## 2014-11-19 NOTE — Progress Notes (Signed)
eLink Physician-Brief Progress Note Patient Name: [name] [name] DOB: 05/11/1932 MRN: [id number]   Date of Service  11/19/2014  HPI/Events of Note  Hypokalemia  eICU Interventions  Potassium replaced     Intervention Category Intermediate Interventions: Electrolyte abnormality - evaluation and management  Naser Schuld 11/19/2014, 6:50 AM

## 2014-11-19 NOTE — Progress Notes (Signed)
PULMONARY / CRITICAL CARE MEDICINE   Name: [name] [name] MRN: [id number] DOB: 07/18/1931    ADMISSION DATE:  11/16/2014 CONSULTATION DATE:  11/16/2014  REFERRING MD :  Kendrick Fries, pulmonary office  CHIEF COMPLAINT:  Shortness of breath  INITIAL PRESENTATION: 81 year old female with a past medical history significant for pulmonary fibrosis believed to be usual interstitial pneumonitis as well as diastolic heart failure was admitted from the pulmonary office on 11/16/2014 with a fever, cough, and worsening hypoxemic respiratory failure.  STUDIES:  7/7 echo 60%, pa 55, RV appears dilated and hypertrophied On parasternal images ? mass in RV but I think this is just a tangential cut of thickened RV. Can consider f/u MRI or TEE if clinically indicated   7/8- improved resp status. in chair, less WOB    SUBJECTIVE/OVERNIGHT/INTERVAL HX 11/19/14: Confirmed to have PIV-3 infection. Feeling better. Says diuresis has helped. She has lost some weight. RN says - [patient improved but stil needing bipap qhs and day time neednig high flow o2 and otherwise desats easily. Wants to know if we need to continue to maintain airborne isolation.   VITAL SIGNS: Temp:  [97.6 F (36.4 C)-98.6 F (37 C)] 97.6 F (36.4 C) (07/09 0754) Pulse Rate:  [65-78] 74 (07/09 0838) Resp:  [18-26] 24 (07/09 0838) BP: (130-170)/(55-100) 143/71 mmHg (07/09 0800) SpO2:  [79 %-98 %] 94 % (07/09 0840) FiO2 (%):  [40 %-50 %] 40 % (07/09 0528) Weight:  [128.459 kg (283 lb 3.2 oz)] 128.459 kg (283 lb 3.2 oz) (07/09 0300) HEMODYNAMICS:   VENTILATOR SETTINGS: Vent Mode:  [-] BIPAP FiO2 (%):  [40 %-50 %] 40 % Set Rate:  [12 bmp] 12 bmp PEEP:  [8 cmH20] 8 cmH20 Pressure Support:  [8 cmH20] 8 cmH20 INTAKE / OUTPUT:  Intake/Output Summary (Last 24 hours) at 11/19/14 1010 Last data filed at 11/18/14 2300  Gross per 24 hour  Intake    243 ml  Output    200 ml  Net     43 ml    PHYSICAL EXAMINATION: General:  In chair no  respiratory distress Neuro:  Awake, alert, no distress HEENT:  Obese neck Cardiovascular:s1 s2 RRR no r Lungs:  crackles bilaterally but improved. Normal WOB. High flow o2 - > pulse ox 88% Abdomen:  Bowel sounds positive nontender nondistended Musculoskeletal:  Normal bulk and tone Skin:  Chronic right leg wound well healed no oozing  LABS:  PULMONARY No results for input(s): PHART, PCO2ART, PO2ART, HCO3, TCO2, O2SAT in the last 168 hours.  Invalid input(s): PCO2, PO2  CBC  Recent Labs Lab 11/16/14 1851 11/17/14 0945 11/19/14 0216  HGB 14.1 14.4 14.1  HCT 44.4 44.6 44.6  WBC 13.2* 12.6* 15.0*  PLT 157 160 200    COAGULATION  Recent Labs Lab 11/16/14 1851 11/17/14 0945 11/18/14 0320 11/19/14 0216  INR 3.28* 2.87* 2.67* 2.00*    CARDIAC   Recent Labs Lab 11/16/14 1851 11/16/14 2328 11/17/14 1231  TROPONINI 0.04* 0.04* 0.03   No results for input(s): PROBNP in the last 168 hours.   CHEMISTRY  Recent Labs Lab 11/16/14 1851 11/17/14 1231 11/18/14 0320 11/19/14 0216  NA 139 144 143 144  K 4.4 3.2* 2.9* 2.9*  CL 96* 98* 91* 90*  CO2 33* 37* 42* 40*  GLUCOSE 118* 130* 131* 155*  BUN 23* 18 19 38*  CREATININE 1.25* 1.19* 1.17* 1.45*  CALCIUM 8.9 8.9 9.0 9.6  MG 2.4  --   --   --   PHOS  3.2  --   --   --    Estimated Creatinine Clearance: 40.4 mL/min (by C-G formula based on Cr of 1.45).   LIVER  Recent Labs Lab 11/16/14 1851 11/17/14 0945 11/18/14 0320 11/19/14 0216  AST 34  --   --  41  ALT 35  --   --  44  ALKPHOS 51  --   --  48  BILITOT 0.6  --   --  0.7  PROT 6.2*  --   --  6.1*  ALBUMIN 3.2*  --   --  3.0*  INR 3.28* 2.87* 2.67* 2.00*     INFECTIOUS  Recent Labs Lab 11/16/14 1851  PROCALCITON 0.28     ENDOCRINE CBG (last 3)  No results for input(s): GLUCAP in the last 72 hours.       IMAGING x48h  - image(s) personally visualized  -   highlighted in bold Dg Chest Port 1 View  11/19/2014   CLINICAL DATA:  Lung  disease  EXAM: PORTABLE CHEST - 1 VIEW  COMPARISON:  11/17/2014  FINDINGS: Lungs are very under aerated. Heterogeneous opacities throughout both lungs have increased. The heart is enlarged. No pneumothorax.  IMPRESSION: Worsening patchy bilateral airspace disease.   Electronically Signed   By: Jolaine ClickArthur  Hoss M.D.   On: 11/19/2014 09:35       ASSESSMENT / PLAN:  #1 Acute respiratory failure with hypoxemia: has severe, end stage ILD and now either has acute flare (pneumonitis) vs less likely pneumonia.  Despite known diagnosis of CHF she has less swelling now than she has had in months.      - Dxis  Acute viral pneumonitis >? ILD flare - due tp Parainfluenza-3 Viral infection  PLAN > keep solumedrol 80mg  IV q8h as made clinical progress thus far > improved clinically with lasix, although last 24 hr was pos 600 cc, increase > use NRB to titrate O2 sat > 85%, add high flow Nasal cannula > BIPAP qHS and PRN - NOT required with no WOB in chair > incentive spirometry  #2 Chronic congestive heart failure> As noted above this is not clearly an exacerbation of her heart failure > follow daily basic metabolic panel > ContinueZaroxolyn > continue diuretics> change to IV today, adjust lasix 7/8 > echo reviewed, no need to image for mass at this stage, would not tolerate MRI or TEE at this stage  #3 paroxysmal A. Fib > Continue diltiazem > Continue warfarin  #4 History of recurrent DVT > continue warfarin  #5 OSA/OHVS > BIPAP qHS, but not daily  Code Status: Full. Per discussions thus far with Dr BQ  FAMILY  - Updates: Son updated on phone 7/7. Patient updated 11/19/2014. Keep in SDU 11/19/2014    - Inter-disciplinary family meet or Palliative Care meeting due by:  MR discussed 11/19/2014 in prsence of RN. She recollects meeting MR in office for ILD 2nd opinion and reports she did not like EOL and code status and goals of care conversations. She reports she has to provide $ for grandauther in  vet school etc. And still has to look forward about life. Says full code     Dr. Kalman ShanMurali Nalla Purdy, M.D., [facility] Hospital[facility].C.C.P Pulmonary and Critical Care Medicine Staff Physician [facility] Pager: [phone], If no answer or between  15:00h - 7:00h: call [phone]  11/19/2014 10:26 AM

## 2014-11-20 LAB — PROTIME-INR
INR: 2.07 — ABNORMAL HIGH (ref 0.00–1.49)
Prothrombin Time: 23.1 seconds — ABNORMAL HIGH (ref 11.6–15.2)

## 2014-11-20 LAB — MAGNESIUM: Magnesium: 2.5 mg/dL — ABNORMAL HIGH (ref 1.7–2.4)

## 2014-11-20 LAB — PHOSPHORUS: PHOSPHORUS: 3.2 mg/dL (ref 2.5–4.6)

## 2014-11-20 MED ORDER — POTASSIUM CHLORIDE 20 MEQ/15ML (10%) PO SOLN
40.0000 meq | Freq: Two times a day (BID) | ORAL | Status: DC
  Administered 2014-11-20: 40 meq via ORAL
  Filled 2014-11-20 (×3): qty 30

## 2014-11-20 MED ORDER — FUROSEMIDE 10 MG/ML IJ SOLN
40.0000 mg | Freq: Three times a day (TID) | INTRAMUSCULAR | Status: DC
  Administered 2014-11-20 – 2014-11-21 (×3): 40 mg via INTRAVENOUS
  Filled 2014-11-20 (×6): qty 4

## 2014-11-20 MED ORDER — POTASSIUM CHLORIDE CRYS ER 20 MEQ PO TBCR
40.0000 meq | EXTENDED_RELEASE_TABLET | Freq: Two times a day (BID) | ORAL | Status: DC
  Administered 2014-11-20 – 2014-11-22 (×4): 40 meq via ORAL
  Filled 2014-11-20 (×6): qty 2

## 2014-11-20 MED ORDER — WARFARIN SODIUM 3 MG PO TABS
3.0000 mg | ORAL_TABLET | Freq: Once | ORAL | Status: AC
  Administered 2014-11-20: 3 mg via ORAL
  Filled 2014-11-20: qty 1

## 2014-11-20 NOTE — Progress Notes (Signed)
Servo vent was pulled and replaced with V60 BIPAP. We have had difficulty maintaining a seal/ low leak with the new Servo NIV mask. Pt complained she did not sleep well last night due to alarms activating throughout night due to high leak. RT exchanged machines and Pt was placed on V60 BIPAP with large mask, same settings. Pt is ok with this and says this mask feels much better. RN/RT will monitor.

## 2014-11-20 NOTE — Progress Notes (Signed)
ANTICOAGULATION CONSULT NOTE - Follow Up Consult  Pharmacy Consult for coumadin  Indication: hx dvt/pe, afib  Labs:  Recent Labs  11/17/14 1231 11/18/14 0320 11/19/14 0216 11/20/14 0220  HGB  --   --  14.1  --   HCT  --   --  44.6  --   PLT  --   --  200  --   LABPROT  --  28.0* 22.5* 23.1*  INR  --  2.67* 2.00* 2.07*  CREATININE 1.19* 1.17* 1.45*  --   TROPONINI 0.03  --   --   --     Estimated Creatinine Clearance: 41.4 mL/min (by C-G formula based on Cr of 1.45).   Assessment: 81 yo F admitted 11/16/2014   from pulmonary MD office with fever, cough, and worsening hypoxemic respiratory failure.  With PIV-3 infection  PMH: pulmonary fibrosis, HF, Afib, hx DVT (2008) and PE (2010), CAD, HLD, HTN, morbid obesity, shingles, GERD, COPD on home O2, Anemia, Gout, Depression  Anticoagulation: hx afib, DVT/PE.   INR at goal, dose held 7/6 PTA: Of note, pt on Coumadin PTA for hx afib, DVT, PE.  PTA dose was 3.75mg  daily (1.5 tabs of 2.5mg ) with last INR 3.2 on 11/07/14. ; Per outpatient records, INR goal 1.8-2.2  Goal of Therapy:  Per outpatient records, INR goal 1.8-2.2 Monitor platelets by anticoagulation protocol: Yes   Plan:  Warfarin 3 mg tonight Daily INR  Thank you for allowing pharmacy to be a part of this patients care team.  Lovenia KimJulie Domanik [place] Pharm.D., BCPS, AQ-Cardiology Clinical Pharmacist 11/20/2014 11:46 AM Pager: [phone]) [phone] Phone: [phone]) [phone]

## 2014-11-20 NOTE — Progress Notes (Signed)
PULMONARY / CRITICAL CARE MEDICINE   Name: [name] [name] MRN: [id number] DOB: April 09, 1932    ADMISSION DATE:  11/16/2014 CONSULTATION DATE:  11/16/2014  REFERRING MD :  Kendrick Fries, pulmonary office  CHIEF COMPLAINT:  Shortness of breath  INITIAL PRESENTATION: 81 year old female with a past medical history significant for pulmonary fibrosis believed to be usual interstitial pneumonitis as well as diastolic heart failure was admitted from the pulmonary office on 11/16/2014 with a fever, cough, and worsening hypoxemic respiratory failure.  STUDIES:  7/7 echo 60%, pa 55, RV appears dilated and hypertrophied On parasternal images ? mass in RV but I think this is just a tangential cut of thickened RV. Can consider f/u MRI or TEE if clinically indicated   7/8- improved resp status. in chair, less WOB   11/19/14: Confirmed to have PIV-3 infection. Feeling better. Says diuresis has helped. She has lost some weight. RN says - [patient improved but stil needing bipap qhs and day time neednig high flow o2 and otherwise desats easily. Wants to know if we need to continue to maintain airborne isolation.    SUBJECTIVE/OVERNIGHT/INTERVAL HX 11/20/14: RN ssyas she does not look as good as  Teacher, English as a foreign language. Reporting slight worsening in resp status. Patient confirms same but also asking for discharge date. Says bipap leaked last night. RN asking for steady K replacement. Needing 6L at rest for poulse ox > 88%.  Sometimes on 15L NRB. Easy desats +.  Creat rising  VITAL SIGNS: Temp:  [96.3 F (35.7 C)-98.2 F (36.8 C)] 96.3 F (35.7 C) (07/10 0744) Pulse Rate:  [60-77] 70 (07/10 0744) Resp:  [15-28] 15 (07/10 0744) BP: (133-159)/(50-59) 155/57 mmHg (07/10 0744) SpO2:  [81 %-100 %] 94 % (07/10 0840) FiO2 (%):  [40 %] 40 % (07/10 0400) Weight:  [133.1 kg (293 lb 6.9 oz)] 133.1 kg (293 lb 6.9 oz) (07/10 0445) HEMODYNAMICS:   VENTILATOR SETTINGS: Vent Mode:  [-] BIPAP FiO2 (%):  [40 %] 40 % Set Rate:  [12  bmp] 12 bmp PEEP:  [8 cmH20] 8 cmH20 Pressure Support:  [8 cmH20] 8 cmH20 INTAKE / OUTPUT:  Intake/Output Summary (Last 24 hours) at 11/20/14 4098 Last data filed at 11/20/14 0100  Gross per 24 hour  Intake    723 ml  Output      0 ml  Net    723 ml    PHYSICAL EXAMINATION: General:  In chair , obese, looks deconditioned Neuro:  Awake, alert, no distress HEENT:  Obese neck Cardiovascular:s1 s2 RRR no r Lungs:  crackles bilaterally but improved. Normal WOB. High flow o2 - > pulse ox 88% Abdomen:  Bowel sounds positive nontender nondistended Musculoskeletal:  Normal bulk and tone Skin:  Chronic right leg wound well healed no oozing  LABS:  PULMONARY No results for input(s): PHART, PCO2ART, PO2ART, HCO3, TCO2, O2SAT in the last 168 hours.  Invalid input(s): PCO2, PO2  CBC  Recent Labs Lab 11/16/14 1851 11/17/14 0945 11/19/14 0216  HGB 14.1 14.4 14.1  HCT 44.4 44.6 44.6  WBC 13.2* 12.6* 15.0*  PLT 157 160 200    COAGULATION  Recent Labs Lab 11/16/14 1851 11/17/14 0945 11/18/14 0320 11/19/14 0216 11/20/14 0220  INR 3.28* 2.87* 2.67* 2.00* 2.07*    CARDIAC    Recent Labs Lab 11/16/14 1851 11/16/14 2328 11/17/14 1231  TROPONINI 0.04* 0.04* 0.03   No results for input(s): PROBNP in the last 168 hours.   CHEMISTRY  Recent Labs Lab 11/16/14 1851 11/17/14 1231 11/18/14 0320 11/19/14  0216 11/20/14 0220  NA 139 144 143 144  --   K 4.4 3.2* 2.9* 2.9*  --   CL 96* 98* 91* 90*  --   CO2 33* 37* 42* 40*  --   GLUCOSE 118* 130* 131* 155*  --   BUN 23* 18 19 38*  --   CREATININE 1.25* 1.19* 1.17* 1.45*  --   CALCIUM 8.9 8.9 9.0 9.6  --   MG 2.4  --   --   --  2.5*  PHOS 3.2  --   --   --  3.2   Estimated Creatinine Clearance: 41.4 mL/min (by C-G formula based on Cr of 1.45).   LIVER  Recent Labs Lab 11/16/14 1851 11/17/14 0945 11/18/14 0320 11/19/14 0216 11/20/14 0220  AST 34  --   --  41  --   ALT 35  --   --  44  --   ALKPHOS 51  --    --  48  --   BILITOT 0.6  --   --  0.7  --   PROT 6.2*  --   --  6.1*  --   ALBUMIN 3.2*  --   --  3.0*  --   INR 3.28* 2.87* 2.67* 2.00* 2.07*     INFECTIOUS  Recent Labs Lab 11/16/14 1851  PROCALCITON 0.28     ENDOCRINE CBG (last 3)  No results for input(s): GLUCAP in the last 72 hours.       IMAGING x48h  - image(s) personally visualized  -   highlighted in bold Dg Chest Port 1 View  11/19/2014   CLINICAL DATA:  Lung disease  EXAM: PORTABLE CHEST - 1 VIEW  COMPARISON:  11/17/2014  FINDINGS: Lungs are very under aerated. Heterogeneous opacities throughout both lungs have increased. The heart is enlarged. No pneumothorax.  IMPRESSION: Worsening patchy bilateral airspace disease.   Electronically Signed   By: Jolaine ClickArthur  Hoss M.D.   On: 11/19/2014 09:35       ASSESSMENT / PLAN:  #1 Acute respiratory failure with hypoxemia: has severe, end stage ILD and now either has acute flare (pneumonitis) vs less likely pneumonia.  Despite known diagnosis of CHF she has less swelling now than she has had in months.      - Dxis  Acute viral pneumonitis >? ILD flare - due tp Parainfluenza-3 Viral infection. Some subjective worsening 11/20/14  PLAN > keep solumedrol 80mg  IV q8h; aim preddnisone 11/21/14 >restart lasix scheduled 11/20/14 > use NRB to titrate O2 sat > 85%, add high flow Nasal cannula > BIPAP qHS and PRN - NOT required with no WOB in chair > incentive spirometry  #2 Chronic diastolic  congestive heart failure> As noted above this is not clearly an exacerbation of her heart failure at admit 11/16/2014  - However, 11/20/14 some worsening might be due to worsening chronic diastolic failure (acute on chronic exacerbation)  PLAN - start scheduled IV lasix 11/20/14 > follow daily basic metabolic panel > ContinueZaroxolyn   #3 paroxysmal A. Fib > Continue diltiazem > Continue warfarin  #4 History of recurrent DVT > continue warfarin  #5 OSA/OHVS > BIPAP qHS, but not  daily  Code Status: Full. Per discussions thus far with Dr BQ and MR 11/19/14   #6) Hypokalemia  - replete  - start scheduled KCL with lasix - but monitor closely   FAMILY  - Updates: Son updated on phone 7/7. Patient updated 11/19/2014. Keep in SDU 11/20/2014    -  Inter-disciplinary family meet or Palliative Care meeting due by:  MR discussed 11/19/2014 in prsence of RN. She recollects meeting MR in office for ILD 2nd opinion and reports she did not like EOL and code status and goals of care conversations she had with him at that time. She reports she has to provide $ for grandauther in vet school etc. And still has to look forward about life. Says full code . She understands very poor prognosis wit h lif expectancy of several months   GLOBAL Keep in SDU Diurese   Dr. Kalman Shan, M.D., [facility] Pulmonary and Critical Care Medicine Staff Physician [facility] [place] [facility] Pager: [phone], If no answer or between  15:00h - 7:00h: call [phone]  11/20/2014 9:21 AM

## 2014-11-21 ENCOUNTER — Encounter (HOSPITAL_COMMUNITY): Payer: Self-pay | Admitting: *Deleted

## 2014-11-21 DIAGNOSIS — J9620 Acute and chronic respiratory failure, unspecified whether with hypoxia or hypercapnia: Secondary | ICD-10-CM

## 2014-11-21 DIAGNOSIS — J849 Interstitial pulmonary disease, unspecified: Secondary | ICD-10-CM

## 2014-11-21 LAB — CBC WITH DIFFERENTIAL/PLATELET
BASOS ABS: 0 10*3/uL (ref 0.0–0.1)
Basophils Relative: 0 % (ref 0–1)
EOS ABS: 0 10*3/uL (ref 0.0–0.7)
Eosinophils Relative: 0 % (ref 0–5)
HCT: 45.9 % (ref 36.0–46.0)
HEMOGLOBIN: 14.6 g/dL (ref 12.0–15.0)
LYMPHS ABS: 0.4 10*3/uL — AB (ref 0.7–4.0)
Lymphocytes Relative: 3 % — ABNORMAL LOW (ref 12–46)
MCH: 32.4 pg (ref 26.0–34.0)
MCHC: 31.8 g/dL (ref 30.0–36.0)
MCV: 102 fL — AB (ref 78.0–100.0)
Monocytes Absolute: 0.8 10*3/uL (ref 0.1–1.0)
Monocytes Relative: 6 % (ref 3–12)
NEUTROS ABS: 11.8 10*3/uL — AB (ref 1.7–7.7)
NEUTROS PCT: 91 % — AB (ref 43–77)
Platelets: 221 10*3/uL (ref 150–400)
RBC: 4.5 MIL/uL (ref 3.87–5.11)
RDW: 16.8 % — AB (ref 11.5–15.5)
WBC: 13 10*3/uL — AB (ref 4.0–10.5)

## 2014-11-21 LAB — MAGNESIUM: Magnesium: 2.4 mg/dL (ref 1.7–2.4)

## 2014-11-21 LAB — CULTURE, BLOOD (ROUTINE X 2)
Culture: NO GROWTH
Culture: NO GROWTH

## 2014-11-21 LAB — BASIC METABOLIC PANEL
ANION GAP: 11 (ref 5–15)
BUN: 47 mg/dL — ABNORMAL HIGH (ref 6–20)
CO2: 42 mmol/L — AB (ref 22–32)
Calcium: 9.3 mg/dL (ref 8.9–10.3)
Chloride: 89 mmol/L — ABNORMAL LOW (ref 101–111)
Creatinine, Ser: 1.34 mg/dL — ABNORMAL HIGH (ref 0.44–1.00)
GFR calc Af Amer: 42 mL/min — ABNORMAL LOW (ref >60)
GFR calc non Af Amer: 36 mL/min — ABNORMAL LOW (ref >60)
Glucose, Bld: 140 mg/dL — ABNORMAL HIGH (ref 65–99)
POTASSIUM: 3.6 mmol/L (ref 3.5–5.1)
SODIUM: 142 mmol/L (ref 135–145)

## 2014-11-21 LAB — PROTIME-INR
INR: 2.56 — ABNORMAL HIGH (ref 0.00–1.49)
Prothrombin Time: 27.2 seconds — ABNORMAL HIGH (ref 11.6–15.2)

## 2014-11-21 LAB — PHOSPHORUS: Phosphorus: 3.8 mg/dL (ref 2.5–4.6)

## 2014-11-21 MED ORDER — WARFARIN SODIUM 2 MG PO TABS
2.0000 mg | ORAL_TABLET | Freq: Once | ORAL | Status: AC
Start: 2014-11-21 — End: 2014-11-21
  Administered 2014-11-21: 2 mg via ORAL
  Filled 2014-11-21: qty 1

## 2014-11-21 MED ORDER — METHYLPREDNISOLONE SODIUM SUCC 125 MG IJ SOLR
60.0000 mg | Freq: Two times a day (BID) | INTRAMUSCULAR | Status: DC
  Administered 2014-11-21 – 2014-11-22 (×3): 60 mg via INTRAVENOUS
  Filled 2014-11-21: qty 0.96
  Filled 2014-11-21: qty 2
  Filled 2014-11-21 (×3): qty 0.96

## 2014-11-21 MED ORDER — FUROSEMIDE 10 MG/ML IJ SOLN
60.0000 mg | Freq: Three times a day (TID) | INTRAMUSCULAR | Status: DC
  Administered 2014-11-21 – 2014-11-24 (×10): 60 mg via INTRAVENOUS
  Filled 2014-11-21 (×9): qty 6

## 2014-11-21 NOTE — Care Management (Signed)
Important Message  Patient Details  Name: [name] [name] MRN: [id number] Date of Birth: 11/27/1931   Medicare Important Message Given:  Yes-third notification given    Kyla BalzarineShealy, Ovetta Bazzano Abena 11/21/2014, 4:01 PM

## 2014-11-21 NOTE — Progress Notes (Addendum)
PULMONARY / CRITICAL CARE MEDICINE   Name: [name] [name] MRN: [id number] DOB: 02-12-1932    ADMISSION DATE:  11/16/2014 CONSULTATION DATE:  11/16/2014  REFERRING MD :  Kendrick Fries, pulmonary office  CHIEF COMPLAINT:  Shortness of breath  INITIAL PRESENTATION: 81 year old female with a past medical history significant for pulmonary fibrosis believed to be usual interstitial pneumonitis as well as diastolic heart failure was admitted from the pulmonary office on 11/16/2014 with a fever, cough, and worsening hypoxemic respiratory failure.  STUDIES:  7/7 echo 60%, pa 55, RV appears dilated and hypertrophied On parasternal images ? mass in RV but I think this is just a tangential cut of thickened RV. Can consider f/u MRI or TEE if clinically indicated   7/8- improved resp status. in chair, less WOB   11/19/14: Confirmed to have PIV-3 infection. Feeling better. Says diuresis has helped. She has lost some weight. RN says - [patient improved but stil needing bipap qhs and day time neednig high flow o2 and otherwise desats easily. Wants to know if we need to continue to maintain airborne isolation.    SUBJECTIVE/OVERNIGHT/INTERVAL HX In chair doing a crossword puzzle intensely   VITAL SIGNS: Temp:  [97.6 F (36.4 C)-98.6 F (37 C)] 98.4 F (36.9 C) (07/11 1215) Pulse Rate:  [58-92] 69 (07/11 1200) Resp:  [17-31] 17 (07/11 1200) BP: (127-153)/(51-70) 139/64 mmHg (07/11 1215) SpO2:  [68 %-98 %] 92 % (07/11 1200) FiO2 (%):  [40 %] 40 % (07/11 0400) Weight:  [130.2 kg (287 lb 0.6 oz)] 130.2 kg (287 lb 0.6 oz) (07/11 0343) HEMODYNAMICS:   VENTILATOR SETTINGS: Vent Mode:  [-]  FiO2 (%):  [40 %] 40 % INTAKE / OUTPUT:  Intake/Output Summary (Last 24 hours) at 11/21/14 1239 Last data filed at 11/21/14 0900  Gross per 24 hour  Intake    780 ml  Output    150 ml  Net    630 ml    PHYSICAL EXAMINATION: General:  In chair , obese, looks deconditioned, no distress Neuro:  Awake, alert, no  distress HEENT:  Obese neck Cardiovascular:s1 s2 RRR no r Lungs:  Crackles improved, 6 liters Abdomen:  Bowel sounds positive nontender nondistended Musculoskeletal:  Normal bulk and tone Skin:  Chronic right leg wound well healed no oozing  LABS:  PULMONARY No results for input(s): PHART, PCO2ART, PO2ART, HCO3, TCO2, O2SAT in the last 168 hours.  Invalid input(s): PCO2, PO2  CBC  Recent Labs Lab 11/17/14 0945 11/19/14 0216 11/21/14 0229  HGB 14.4 14.1 14.6  HCT 44.6 44.6 45.9  WBC 12.6* 15.0* 13.0*  PLT 160 200 221    COAGULATION  Recent Labs Lab 11/17/14 0945 11/18/14 0320 11/19/14 0216 11/20/14 0220 11/21/14 0229  INR 2.87* 2.67* 2.00* 2.07* 2.56*    CARDIAC    Recent Labs Lab 11/16/14 1851 11/16/14 2328 11/17/14 1231  TROPONINI 0.04* 0.04* 0.03   No results for input(s): PROBNP in the last 168 hours.   CHEMISTRY  Recent Labs Lab 11/16/14 1851 11/17/14 1231 11/18/14 0320 11/19/14 0216 11/20/14 0220 11/21/14 0229  NA 139 144 143 144  --  142  K 4.4 3.2* 2.9* 2.9*  --  3.6  CL 96* 98* 91* 90*  --  89*  CO2 33* 37* 42* 40*  --  42*  GLUCOSE 118* 130* 131* 155*  --  140*  BUN 23* 18 19 38*  --  47*  CREATININE 1.25* 1.19* 1.17* 1.45*  --  1.34*  CALCIUM 8.9 8.9 9.0  9.6  --  9.3  MG 2.4  --   --   --  2.5* 2.4  PHOS 3.2  --   --   --  3.2 3.8   Estimated Creatinine Clearance: 44.1 mL/min (by C-G formula based on Cr of 1.34).   LIVER  Recent Labs Lab 11/16/14 1851 11/17/14 0945 11/18/14 0320 11/19/14 0216 11/20/14 0220 11/21/14 0229  AST 34  --   --  41  --   --   ALT 35  --   --  44  --   --   ALKPHOS 51  --   --  48  --   --   BILITOT 0.6  --   --  0.7  --   --   PROT 6.2*  --   --  6.1*  --   --   ALBUMIN 3.2*  --   --  3.0*  --   --   INR 3.28* 2.87* 2.67* 2.00* 2.07* 2.56*     INFECTIOUS  Recent Labs Lab 11/16/14 1851  PROCALCITON 0.28     ENDOCRINE CBG (last 3)  No results for input(s): GLUCAP in the last  72 hours.       IMAGING x48h  - image(s) personally visualized  -   highlighted in bold No results found.     ASSESSMENT / PLAN:  #1 Acute respiratory failure with hypoxemia: has severe, end stage ILD and now either has acute flare (pneumonitis) vs less likely pneumonia.  Despite known diagnosis of CHF she has less swelling now than she has had in months.      - Dxis  Acute viral pneumonitis >? ILD flare - due tp Parainfluenza-3 Viral infection.  PLAN > keep solumedrol but reduce >restart lasix scheduled 11/20/14,need increase, had pos balance, renal tolerating > use NRB to eat as needed > BIPAP qHS and PRN - NOT required with no WOB in chair > incentive spirometry -pcxr inm am   #2 Chronic diastolic  congestive heart failure> As noted above this is not clearly an exacerbation of her heart failure at admit 11/16/2014  PLAN Lasix increase > follow daily basic metabolic panel in am  > ContinueZaroxolyn   #3 paroxysmal A. Fib > Continue diltiazem > Continue warfarin  #4 History of recurrent DVT > continue warfarin  #5 OSA/OHVS > BIPAP qHS, but not daily, uses at night, compliant  Code Status: Full. Per discussions thus far with Dr BQ and MR 11/19/14    FAMILY  - Updates: Son updated on phone 7/7. Patient updated 11/19/2014. Keep in SDU 11/21/2014    - Inter-disciplinary family meet or Palliative Care meeting due by:  MR discussed 11/19/2014 in prsence of RN. She recollects meeting MR in office for ILD 2nd opinion and reports she did not like EOL and code status and goals of care conversations she had with him at that time. She reports she has to provide $ for grandauther in vet school etc. And still has to look forward about life. Says full code . She understands very poor prognosis wit h lif expectancy of several months   GLOBAL To tele, pulse ox   Mcarthur Rossettianiel J. Tyson AliasFeinstein, MD, FACP Pgr: [phone] [facility]

## 2014-11-21 NOTE — Progress Notes (Signed)
ANTICOAGULATION CONSULT NOTE - Follow Up Consult  Pharmacy Consult for coumadin  Indication: hx dvt/pe, afib  Labs:  Recent Labs  11/19/14 0216 11/20/14 0220 11/21/14 0229  HGB 14.1  --  14.6  HCT 44.6  --  45.9  PLT 200  --  221  LABPROT 22.5* 23.1* 27.2*  INR 2.00* 2.07* 2.56*  CREATININE 1.45*  --  1.34*    Estimated Creatinine Clearance: 44.1 mL/min (by C-G formula based on Cr of 1.34).   Assessment: 81 yo F admitted 11/16/2014   from pulmonary MD office with fever, cough, and worsening hypoxemic respiratory failure.  With PIV-3 infection  PMH: pulmonary fibrosis, HF, Afib, hx DVT (2008) and PE (2010), CAD, HLD, HTN, morbid obesity, shingles, GERD, COPD on home O2, Anemia, Gout, Depression  Anticoagulation: hx afib, DVT/PE.   INR supra-therapeutic at 2.56, dose held 7/6 PTA: Of note, pt on Coumadin PTA for hx afib, DVT, PE.  PTA dose was 3.75mg  daily (1.5 tabs of 2.5mg ) with last INR 3.2 on 11/07/14. ; Per outpatient records, INR goal 1.8-2.2  Goal of Therapy:  Per outpatient records, INR goal 1.8-2.2 Monitor platelets by anticoagulation protocol: Yes   Plan:  Warfarin 2 mg tonight Daily INR  Vinnie LevelBenjamin Daved Mcfann, PharmD., BCPS Clinical Pharmacist Pager [phone]

## 2014-11-22 ENCOUNTER — Inpatient Hospital Stay (HOSPITAL_COMMUNITY): Payer: Medicare Other

## 2014-11-22 LAB — CBC WITH DIFFERENTIAL/PLATELET
Basophils Absolute: 0 10*3/uL (ref 0.0–0.1)
Basophils Relative: 0 % (ref 0–1)
EOS PCT: 0 % (ref 0–5)
Eosinophils Absolute: 0 10*3/uL (ref 0.0–0.7)
HEMATOCRIT: 45.3 % (ref 36.0–46.0)
Hemoglobin: 14.4 g/dL (ref 12.0–15.0)
LYMPHS PCT: 3 % — AB (ref 12–46)
Lymphs Abs: 0.4 10*3/uL — ABNORMAL LOW (ref 0.7–4.0)
MCH: 31.9 pg (ref 26.0–34.0)
MCHC: 31.8 g/dL (ref 30.0–36.0)
MCV: 100.2 fL — ABNORMAL HIGH (ref 78.0–100.0)
Monocytes Absolute: 0.5 10*3/uL (ref 0.1–1.0)
Monocytes Relative: 4 % (ref 3–12)
Neutro Abs: 12.2 10*3/uL — ABNORMAL HIGH (ref 1.7–7.7)
Neutrophils Relative %: 93 % — ABNORMAL HIGH (ref 43–77)
PLATELETS: 239 10*3/uL (ref 150–400)
RBC: 4.52 MIL/uL (ref 3.87–5.11)
RDW: 16.8 % — AB (ref 11.5–15.5)
WBC: 13.1 10*3/uL — AB (ref 4.0–10.5)

## 2014-11-22 LAB — BASIC METABOLIC PANEL
ANION GAP: 12 (ref 5–15)
Anion gap: 13 (ref 5–15)
Anion gap: 12 (ref 5–15)
BUN: 60 mg/dL — ABNORMAL HIGH (ref 6–20)
BUN: 70 mg/dL — AB (ref 6–20)
BUN: 75 mg/dL — ABNORMAL HIGH (ref 6–20)
CALCIUM: 9 mg/dL (ref 8.9–10.3)
CHLORIDE: 86 mmol/L — AB (ref 101–111)
CO2: 43 mmol/L — ABNORMAL HIGH (ref 22–32)
CO2: 39 mmol/L — AB (ref 22–32)
CO2: 41 mmol/L — ABNORMAL HIGH (ref 22–32)
CREATININE: 1.43 mg/dL — AB (ref 0.44–1.00)
Calcium: 9.4 mg/dL (ref 8.9–10.3)
Calcium: 9.4 mg/dL (ref 8.9–10.3)
Chloride: 85 mmol/L — ABNORMAL LOW (ref 101–111)
Chloride: 86 mmol/L — ABNORMAL LOW (ref 101–111)
Creatinine, Ser: 1.83 mg/dL — ABNORMAL HIGH (ref 0.44–1.00)
Creatinine, Ser: 1.96 mg/dL — ABNORMAL HIGH (ref 0.44–1.00)
GFR calc Af Amer: 39 mL/min — ABNORMAL LOW (ref >60)
GFR calc Af Amer: 26 mL/min — ABNORMAL LOW (ref >60)
GFR calc non Af Amer: 33 mL/min — ABNORMAL LOW (ref >60)
GFR calc non Af Amer: 25 mL/min — ABNORMAL LOW (ref >60)
GFR, EST AFRICAN AMERICAN: 29 mL/min — AB (ref >60)
GFR, EST NON AFRICAN AMERICAN: 23 mL/min — AB (ref >60)
Glucose, Bld: 148 mg/dL — ABNORMAL HIGH (ref 65–99)
Glucose, Bld: 273 mg/dL — ABNORMAL HIGH (ref 65–99)
Glucose, Bld: 209 mg/dL — ABNORMAL HIGH (ref 65–99)
POTASSIUM: 2.9 mmol/L — AB (ref 3.5–5.1)
POTASSIUM: 5.7 mmol/L — AB (ref 3.5–5.1)
POTASSIUM: 3.6 mmol/L (ref 3.5–5.1)
SODIUM: 142 mmol/L (ref 135–145)
SODIUM: 136 mmol/L (ref 135–145)
Sodium: 139 mmol/L (ref 135–145)

## 2014-11-22 LAB — MAGNESIUM: Magnesium: 2.4 mg/dL (ref 1.7–2.4)

## 2014-11-22 LAB — PROTIME-INR
INR: 2.72 — AB (ref 0.00–1.49)
Prothrombin Time: 28.4 seconds — ABNORMAL HIGH (ref 11.6–15.2)

## 2014-11-22 LAB — PHOSPHORUS: Phosphorus: 4.6 mg/dL (ref 2.5–4.6)

## 2014-11-22 MED ORDER — POTASSIUM CHLORIDE CRYS ER 20 MEQ PO TBCR
40.0000 meq | EXTENDED_RELEASE_TABLET | Freq: Three times a day (TID) | ORAL | Status: DC
Start: 2014-11-22 — End: 2014-11-24
  Administered 2014-11-22 – 2014-11-24 (×7): 40 meq via ORAL
  Filled 2014-11-22 (×10): qty 2

## 2014-11-22 MED ORDER — MUPIROCIN 2 % EX OINT
1.0000 "application " | TOPICAL_OINTMENT | Freq: Two times a day (BID) | CUTANEOUS | Status: DC
  Administered 2014-11-22 – 2014-11-24 (×5): 1 via NASAL
  Filled 2014-11-22: qty 22

## 2014-11-22 MED ORDER — POTASSIUM CHLORIDE CRYS ER 20 MEQ PO TBCR
40.0000 meq | EXTENDED_RELEASE_TABLET | Freq: Once | ORAL | Status: AC
  Administered 2014-11-22: 40 meq via ORAL

## 2014-11-22 MED ORDER — WARFARIN 0.5 MG HALF TABLET
0.5000 mg | ORAL_TABLET | Freq: Once | ORAL | Status: AC
  Administered 2014-11-22: 0.5 mg via ORAL
  Filled 2014-11-22: qty 1

## 2014-11-22 MED ORDER — CHLORHEXIDINE GLUCONATE CLOTH 2 % EX PADS
6.0000 | MEDICATED_PAD | Freq: Every day | CUTANEOUS | Status: DC
  Administered 2014-11-22 – 2014-11-24 (×3): 6 via TOPICAL

## 2014-11-22 MED ORDER — CHLORHEXIDINE GLUCONATE CLOTH 2 % EX PADS: 6.0000 | MEDICATED_PAD | Freq: Every day | CUTANEOUS | Status: DC

## 2014-11-22 NOTE — Progress Notes (Signed)
ANTICOAGULATION CONSULT NOTE - Follow Up Consult  Pharmacy Consult for coumadin  Indication: hx dvt/pe, afib  Labs:  Recent Labs  11/20/14 0220 11/21/14 0229 11/22/14 0431  HGB  --  14.6 14.4  HCT  --  45.9 45.3  PLT  --  221 239  LABPROT 23.1* 27.2* 28.4*  INR 2.07* 2.56* 2.72*  CREATININE  --  1.34* 1.43*    Estimated Creatinine Clearance: 41.5 mL/min (by C-G formula based on Cr of 1.43).   Assessment:  Continues on Coumadin for hx afib, DVT and PE.  INR is therapeutic (2.72) today, but above her low outpatient goal of 1.8-2.2  INR trending up, despite small decrease in Coumadin dose daily x 3.   Home Coumadin dose:  3.75 mg daily (1.5 tabs of 2.5 mg)   Goal of Therapy:  Per outpatient records, INR goal 1.8-2.2 Monitor platelets by anticoagulation protocol: Yes   Plan:   Decrease today's Coumadin dose to 0.5 mg x 1.  Continue daily PT/INR.  Dennie Fettersgan, Forrest Demuro Donovan, [place] Pager: [phone] 11/22/2014 3:53 PM

## 2014-11-22 NOTE — Progress Notes (Signed)
Patient is in A-fib with sustained HR 110-120s. Pt resting in bed with cpap in place. Denies SOB or discomfort. Vs 97.7-110-19-118/54. O2 sat is 91%. MD notified. Will continue to monitor.

## 2014-11-22 NOTE — Progress Notes (Signed)
Received telemetry alert while at patient bedside. 8 beat run vtach. Confirmed with CCMD. Pt sitting up in chair eating dinner without additional complaints. Will continue to monitor. Bess KindsGWALTNEY, Skanda Worlds B, RN

## 2014-11-22 NOTE — Progress Notes (Addendum)
PULMONARY / CRITICAL CARE MEDICINE   Name: [name] [name] MRN: [id number] DOB: 05-27-1931    ADMISSION DATE:  11/16/2014 CONSULTATION DATE:  11/16/2014  REFERRING MD :  Kendrick Fries, pulmonary office  CHIEF COMPLAINT:  Shortness of breath  INITIAL PRESENTATION: 81 year old female with a past medical history significant for pulmonary fibrosis believed to be usual interstitial pneumonitis as well as diastolic heart failure was admitted from the pulmonary office on 11/16/2014 with a fever, cough, and worsening hypoxemic respiratory failure.  STUDIES:  7/7 echo 60%, pa 55, RV appears dilated and hypertrophied On parasternal images ? mass in RV but I think this is just a tangential cut of thickened RV. Can consider f/u MRI or TEE if clinically indicated  11/19/14: Confirmed to have PIV-3 infection.  SUBJECTIVE/OVERNIGHT/INTERVAL HX Short episode of [place] fib over night,  Back in NSR now. Subjectively worse breathing this AM. Remains on 6L O2 with sats 92%. Rapidly desats with any activity.   VITAL SIGNS: Temp:  [97.7 F (36.5 C)-98.4 F (36.9 C)] 98.3 F (36.8 C) (07/12 0900) Pulse Rate:  [66-110] 96 (07/12 0900) Resp:  [17-26] 17 (07/12 0900) BP: (108-139)/(53-82) 122/82 mmHg (07/12 0900) SpO2:  [90 %-95 %] 92 % (07/12 0900) Weight:  [131.316 kg (289 lb 8 oz)] 131.316 kg (289 lb 8 oz) (07/11 2058) HEMODYNAMICS:   VENTILATOR SETTINGS:   INTAKE / OUTPUT:  Intake/Output Summary (Last 24 hours) at 11/22/14 1101 Last data filed at 11/22/14 0925  Gross per 24 hour  Intake    600 ml  Output   3000 ml  Net  -2400 ml    PHYSICAL EXAMINATION: General:  In chair , obese, looks deconditioned, no distress Neuro:  Awake, alert, no distress HEENT:  Obese neck Cardiovascular:s1 s2 RRR no r Lungs:  Crackles throughout, 6 L [place] Abdomen:  Bowel sounds positive nontender nondistended Musculoskeletal:  Normal bulk and tone Skin:  Chronic right leg wound well healed no  oozing  LABS:  PULMONARY No results for input(s): PHART, PCO2ART, PO2ART, HCO3, TCO2, O2SAT in the last 168 hours.  Invalid input(s): PCO2, PO2  CBC  Recent Labs Lab 11/19/14 0216 11/21/14 0229 11/22/14 0431  HGB 14.1 14.6 14.4  HCT 44.6 45.9 45.3  WBC 15.0* 13.0* 13.1*  PLT 200 221 239    COAGULATION  Recent Labs Lab 11/18/14 0320 11/19/14 0216 11/20/14 0220 11/21/14 0229 11/22/14 0431  INR 2.67* 2.00* 2.07* 2.56* 2.72*    CARDIAC    Recent Labs Lab 11/16/14 1851 11/16/14 2328 11/17/14 1231  TROPONINI 0.04* 0.04* 0.03   No results for input(s): PROBNP in the last 168 hours.   CHEMISTRY  Recent Labs Lab 11/16/14 1851 11/17/14 1231 11/18/14 0320 11/19/14 0216 11/20/14 0220 11/21/14 0229 11/22/14 0431  NA 139 144 143 144  --  142 142  K 4.4 3.2* 2.9* 2.9*  --  3.6 2.9*  CL 96* 98* 91* 90*  --  89* 86*  CO2 33* 37* 42* 40*  --  42* 43*  GLUCOSE 118* 130* 131* 155*  --  140* 148*  BUN 23* 18 19 38*  --  47* 60*  CREATININE 1.25* 1.19* 1.17* 1.45*  --  1.34* 1.43*  CALCIUM 8.9 8.9 9.0 9.6  --  9.3 9.4  MG 2.4  --   --   --  2.5* 2.4 2.4  PHOS 3.2  --   --   --  3.2 3.8 4.6   Estimated Creatinine Clearance: 41.5 mL/min (by C-G formula based  on Cr of 1.43).   LIVER  Recent Labs Lab 11/16/14 1851  11/18/14 0320 11/19/14 0216 11/20/14 0220 11/21/14 0229 11/22/14 0431  AST 34  --   --  41  --   --   --   ALT 35  --   --  44  --   --   --   ALKPHOS 51  --   --  48  --   --   --   BILITOT 0.6  --   --  0.7  --   --   --   PROT 6.2*  --   --  6.1*  --   --   --   ALBUMIN 3.2*  --   --  3.0*  --   --   --   INR 3.28*  < > 2.67* 2.00* 2.07* 2.56* 2.72*  < > = values in this interval not displayed.   INFECTIOUS  Recent Labs Lab 11/16/14 1851  PROCALCITON 0.28     ENDOCRINE CBG (last 3)  No results for input(s): GLUCAP in the last 72 hours.   Dg Chest Port 1 View  11/22/2014   CLINICAL DATA:  Edema and shortness of breath;  acute on chronic respiratory failure.  EXAM: PORTABLE CHEST - 1 VIEW  COMPARISON:  Portable chest x-ray of July 9th 2016  FINDINGS: The lungs are better inflated today. There remain coarse interstitial opacities bilaterally. The left hemidiaphragm remains obscured. Small bilateral pleural effusions persist. The cardiac silhouette pulmonary vascularity are engorged. The mediastinum is less prominent in width today. The trachea is midline. The bony thorax exhibits no acute abnormality.  IMPRESSION: There has been mild interval improvement in the appearance of the pulmonary interstitium which may indicate improving edema or pneumonia. There remain small bilateral pleural effusions.   Electronically Signed   By: David  [place] M.D.   On: 11/22/2014 07:44     ASSESSMENT / PLAN:  Acute respiratory failure with hypoxemia: has severe, end stage ILD and now either has acute viral pneumonitis.  Despite known diagnosis of CHF she has less swelling now than she has had in months.    - Dx is  Acute viral pneumonitis >? ILD flare - due tp Parainfluenza-3 Viral infection.  7/12 CXR with mild improvement of edema  PLAN > keep solumedrol but taper as able (reduced 7/11) > Lasix  TID, continue as renal function allows. > BIPAP qHS and PRN > incentive spirometry > intermittent CXR  Chronic diastolic  congestive heart failure> As noted above this is not clearly an exacerbation of her heart failure at admit   PLAN > Lasix as above > follow daily basic metabolic panel in am  > ContinueZaroxolyn  paroxysmal A. Fib > Continue diltiazem > Continue warfarin  History of recurrent DVT > continue warfarin  OSA/OHVS > BIPAP qHS, but not daily, uses at night, compliant  Hypokalemia: secondary to diuresis > Increase potassium supplementation > Follow Bmet this PM  Code Status: Full. Per discussions thus far with Dr BQ and MR 11/19/14   FAMILY  - Updates: Discussed care with patient 7/12  -  Inter-disciplinary family meet or Palliative Care meeting due by:  MR discussed 11/19/2014 in prsence of RN. She recollects meeting MR in office for ILD 2nd opinion and reports she did not like EOL and code status and goals of care conversations she had with him at that time. She reports she has to provide $ for grandauther in vet school etc.  And still has to look forward about life. Says full code . She understands very poor prognosis wit h lif expectancy of several months   Joneen Roachaul Hoffman, AGACNP-BC [facility] Pager [phone] or [phone] 11/22/2014 11:04 AM   Attending Note:  I have examined patient, reviewed labs, studies and notes. I have discussed the case with Henreitta LeberP Hoffman, and I agree with the data and plans as amended above.   Levy Pupaobert Eulla Kochanowski, MD, PhD 11/22/2014, 11:55 AM [facility] [phone] or if no answer [phone]

## 2014-11-22 NOTE — Progress Notes (Signed)
Pt up in chair. Stated feeling a little more short of breath. VSS. Noted telemetry. Pt has resumed sinus rhythm on telemetry. Rate 70s. NP now at bedside. Will continue to monitor. Bess KindsGWALTNEY, Jazyah Butsch B, RN

## 2014-11-23 ENCOUNTER — Telehealth: Payer: Self-pay | Admitting: *Deleted

## 2014-11-23 DIAGNOSIS — R5381 Other malaise: Secondary | ICD-10-CM

## 2014-11-23 DIAGNOSIS — J9601 Acute respiratory failure with hypoxia: Secondary | ICD-10-CM

## 2014-11-23 LAB — CBC WITH DIFFERENTIAL/PLATELET
BASOS ABS: 0 10*3/uL (ref 0.0–0.1)
Basophils Relative: 0 % (ref 0–1)
EOS PCT: 0 % (ref 0–5)
Eosinophils Absolute: 0 10*3/uL (ref 0.0–0.7)
HCT: 44.9 % (ref 36.0–46.0)
Hemoglobin: 14.5 g/dL (ref 12.0–15.0)
LYMPHS PCT: 2 % — AB (ref 12–46)
Lymphs Abs: 0.3 10*3/uL — ABNORMAL LOW (ref 0.7–4.0)
MCH: 32.4 pg (ref 26.0–34.0)
MCHC: 32.3 g/dL (ref 30.0–36.0)
MCV: 100.2 fL — ABNORMAL HIGH (ref 78.0–100.0)
Monocytes Absolute: 0.5 10*3/uL (ref 0.1–1.0)
Monocytes Relative: 4 % (ref 3–12)
NEUTROS ABS: 12.1 10*3/uL — AB (ref 1.7–7.7)
Neutrophils Relative %: 94 % — ABNORMAL HIGH (ref 43–77)
Platelets: 226 10*3/uL (ref 150–400)
RBC: 4.48 MIL/uL (ref 3.87–5.11)
RDW: 16.5 % — ABNORMAL HIGH (ref 11.5–15.5)
WBC: 12.9 10*3/uL — ABNORMAL HIGH (ref 4.0–10.5)

## 2014-11-23 LAB — BASIC METABOLIC PANEL
Anion gap: 10 (ref 5–15)
BUN: 70 mg/dL — ABNORMAL HIGH (ref 6–20)
CHLORIDE: 88 mmol/L — AB (ref 101–111)
CO2: 42 mmol/L — AB (ref 22–32)
Calcium: 9 mg/dL (ref 8.9–10.3)
Creatinine, Ser: 1.62 mg/dL — ABNORMAL HIGH (ref 0.44–1.00)
GFR, EST AFRICAN AMERICAN: 33 mL/min — AB (ref >60)
GFR, EST NON AFRICAN AMERICAN: 29 mL/min — AB (ref >60)
GLUCOSE: 161 mg/dL — AB (ref 65–99)
POTASSIUM: 3.2 mmol/L — AB (ref 3.5–5.1)
Sodium: 140 mmol/L (ref 135–145)

## 2014-11-23 LAB — PROTIME-INR
INR: 2.3 — AB (ref 0.00–1.49)
Prothrombin Time: 25.1 seconds — ABNORMAL HIGH (ref 11.6–15.2)

## 2014-11-23 LAB — PHOSPHORUS: PHOSPHORUS: 4.5 mg/dL (ref 2.5–4.6)

## 2014-11-23 LAB — MAGNESIUM: MAGNESIUM: 2.6 mg/dL — AB (ref 1.7–2.4)

## 2014-11-23 MED ORDER — WARFARIN SODIUM 2 MG PO TABS
2.0000 mg | ORAL_TABLET | Freq: Once | ORAL | Status: AC
  Administered 2014-11-23: 2 mg via ORAL
  Filled 2014-11-23: qty 1

## 2014-11-23 MED ORDER — METHYLPREDNISOLONE SODIUM SUCC 40 MG IJ SOLR
40.0000 mg | Freq: Two times a day (BID) | INTRAMUSCULAR | Status: DC
  Administered 2014-11-23 – 2014-11-24 (×3): 40 mg via INTRAVENOUS
  Filled 2014-11-23 (×3): qty 1

## 2014-11-23 MED ORDER — POTASSIUM CHLORIDE CRYS ER 20 MEQ PO TBCR
40.0000 meq | EXTENDED_RELEASE_TABLET | Freq: Once | ORAL | Status: AC
  Administered 2014-11-23: 40 meq via ORAL

## 2014-11-23 NOTE — Consult Note (Signed)
Physical Medicine and Rehabilitation Consult Reason for Consult: Debilitation related to acute respiratory failure with hypoxemia/medical Referring Physician: Pulmonary services   HPI: [name] [name] is a 81 y.o. right handed female with history of chronic renal insufficiency with creatinine 1.19, pulmonary fibrosis with home oxygen of 5 L, diastolic congestive heart failure, chronic atrial fibrillation maintained on Coumadin therapy. Patient lives with her son  and also home health aide 5 hours a day 5 days a week. She used a walker to help aid in mobility. Presented 11/16/2014 with increasing shortness of breath, fever ,cough. Chest x-ray with no acute abnormalities no evidence of pulmonary edema. Appearance consistent with chronic interstitial lung disease. Pulmonary service follow-up suspect acute viral pneumonitis placed on Solu-Medrol taper continues with diuresis. Echocardiogram with ejection fraction of 60% normal systolic function. Chronic Coumadin ongoing. Contact precautions for MRSA PCR screening positive. Physical therapy evaluation completed 11/23/2014 with recommendations of physical medicine rehabilitation consult.   Review of Systems  Constitutional: Positive for fever. Negative for chills.  Eyes: Negative for blurred vision and double vision.  Respiratory: Positive for cough and shortness of breath.   Cardiovascular: Positive for palpitations and leg swelling.  Gastrointestinal: Positive for heartburn and constipation.       GERD  Musculoskeletal: Positive for myalgias and joint pain.  Skin: Negative for rash.  Neurological: Negative for dizziness and headaches.  Psychiatric/Behavioral: Positive for depression.   Past Medical History  Diagnosis Date  . Congestive heart failure, unspecified     a. ECHO 6/0: EF 55-60%, mild LVH, mildly dilated RV w. mildly dec fx, RVSP 67  . Chronic atrial fibrillation     failed cardioversion in past  -repeat DC-CV on October 5th,  2010  . Personal history of DVT (deep vein thrombosis) 2008    LLE  . HTN (hypertension)   . Morbid obesity   . Shingles     with postherpetic neuraigia  . GERD (gastroesophageal reflux disease)   . Hyperlipidemia   . Meningitis ~ 1960    hospitalized @ [facility]  . Gallstones     s/p cholecystectomy  . CAD (coronary artery disease)     nonobstructive by cath 8/10 - LAD 40-50% w mild PAH mean 29 w PVR 3.2 [place]  . PE (pulmonary embolism) ~ 2010    LLE  . Shortness of breath dyspnea   . Lung interstitial disease   . Hypoxemia   . COPD (chronic obstructive pulmonary disease)   . On home oxygen therapy     "5L; 24/7" (10/11/2014)  . OSA treated with BiPAP     "I think it's BiPAP"  . AAA (abdominal aortic aneurysm)     small  . History of blood transfusion   . Anemia   . Iron deficiency anemia   . History of blood transfusion     "related to nose bleed"  . Gout   . Depression   . Chronic kidney disease (CKD), stage II (mild)    Past Surgical History  Procedure Laterality Date  . Appendectomy  1947  . Tonsillectomy  1946  . Cardioversion  01/2007    x2  . Right heart catheterization N/A 02/28/2014    Procedure: RIGHT HEART CATH;  Surgeon: Laurey Morale, MD;  Location: [facility] CATH LAB;  Service: Cardiovascular;  Laterality: N/A;  . Cardiac catheterization  02/2014  . Cataract extraction w/ intraocular lens  implant, bilateral Bilateral    Family History  Problem Relation Age of  Onset  . Heart failure Mother   . Depression Mother   . Ovarian cancer Paternal Grandmother   . Heart disease Paternal Grandfather   . Obesity Other    Social History:  reports that she has never smoked. She has never used smokeless tobacco. She reports that she drinks alcohol. She reports that she does not use illicit drugs. Allergies:  Allergies  Allergen Reactions  . Pirfenidone Other (See Comments)    Congestion, breathing diffulty   Medications Prior to Admission  Medication Sig  Dispense Refill  . albuterol (PROVENTIL) (2.5 MG/3ML) 0.083% nebulizer solution Take 3 mLs (2.5 mg total) by nebulization every 2 (two) hours as needed for wheezing or shortness of breath. 75 mL 12  . allopurinol (ZYLOPRIM) 300 MG tablet Take 1 tablet (300 mg total) by mouth daily. 30 tablet 11  . aspirin 81 MG tablet Take 81 mg by mouth daily.    [name] atorvastatin (LIPITOR) 10 MG tablet Take 1 tablet (10 mg total) by mouth daily. 30 tablet 5  . budesonide (PULMICORT) 0.25 MG/2ML nebulizer solution Take 0.25 mg by nebulization 2 (two) times daily.    . cetirizine (ZYRTEC ALLERGY) 10 MG tablet Take 10 mg by mouth daily.     . colchicine 0.6 MG tablet Take 0.6 mg by mouth as needed (for flareup).     [name] diltiazem (CARDIZEM CD) 240 MG 24 hr capsule Take 1 capsule (240 mg total) by mouth daily. 30 capsule 0  . escitalopram (LEXAPRO) 20 MG tablet Take 1 tablet (20 mg total) by mouth daily. 30 tablet 5  . famotidine (PEPCID) 20 MG tablet Take 1 tablet (20 mg total) by mouth daily. 30 tablet 6  . ferrous sulfate 325 (65 FE) MG tablet Take 1 tablet (325 mg total) by mouth 2 (two) times daily with a meal. 60 tablet 0  . furosemide (LASIX) 80 MG tablet Take 1 tablet (80 mg total) by mouth every 8 (eight) hours. 90 tablet 1  . gabapentin (NEURONTIN) 300 MG capsule Take 1 capsule (300 mg total) by mouth daily. 90 capsule 0  . Multiple Vitamins-Minerals (MULTIVITAMIN & MINERAL PO) Take 1 tablet by mouth daily.    [name] nystatin (MYCOSTATIN/NYSTOP) 100000 UNIT/GM POWD Apply to flank and groin and under breasts BID (Patient taking differently: Apply topically 2 (two) times daily as needed (fungal areas). Apply to flank and groin and under breasts BID) 60 g 2  . Polyethyl Glycol-Propyl Glycol (SYSTANE OP) Place 1 drop into both eyes daily as needed (dry eyes).    . potassium chloride SA (K-DUR,KLOR-CON) 20 MEQ tablet Take 20 mEq by mouth daily.    . predniSONE (DELTASONE) 5 MG tablet Take 15 mg daily for one month Take  10 mg daily for one month Take 5 mg daily for one month (Patient taking differently: Take 15 mg by mouth daily with breakfast. Take 15 mg daily for one month Take 10 mg daily for one month Take 5 mg daily for one month) 180 tablet 0  . warfarin (COUMADIN) 2.5 MG tablet Take as directed. (Patient taking differently: Take 3.75 mg by mouth daily at 6 PM. Take as directed.) 90 tablet 0  . metolazone (ZAROXOLYN) 5 MG tablet Take 1 tablet (5 mg total) by mouth daily as needed. (Patient taking differently: Take 5 mg by mouth daily as needed (for fluid retention when weight reaches 295). ) 30 tablet 1  . OVER THE COUNTER MEDICATION Inhale 1 application into the lungs at bedtime. USES  BIPAP EVERY BEDTIME FOR SLEEP      Home: Home Living Family/patient expects to be discharged to:: Private residence Living Arrangements: Children, Non-relatives/Friends Available Help at Discharge: Family, Personal care attendant, Available 24 hours/day Type of Home: House Home Access: Stairs to enter Entergy CorporationEntrance Stairs-Number of Steps: 2 Entrance Stairs-Rails: Left, Right, Can reach both Home Layout: One level Bathroom Shower/Tub: Health visitorWalk-in shower Bathroom Toilet: Handicapped height Bathroom Accessibility: Yes Home Equipment: Information systems managerhower seat - built in, Coventry Health Carerab bars - tub/shower, Environmental consultantWalker - 2 wheels, Transport chair, Bedside commode, Other (comment), Adaptive equipment Adaptive Equipment: Reacher, Sock aid, Long-handled shoe horn, Long-handled sponge Additional Comments: son and daughter in-law live with pt; has aide 5 hours daily x 5 days a week  Functional History: Prior Function Level of Independence: Needs assistance Gait / Transfers Assistance Needed: Uses RW and has lift chair and elevated height bed.   ADL's / Homemaking Assistance Needed: (A) with cooking/cleaning; reports she can dress and bathe herself  Comments: pt reports incr difficulty ambulating to/from bathroom at home; very anxious and fearful of  falling Functional Status:  Mobility: Bed Mobility General bed mobility comments: up in chair Transfers Overall transfer level: Needs assistance Equipment used: Rolling walker (2 wheeled) Transfers: Sit to/from Stand Sit to Stand: Min guard General transfer comment: unsteady; min guard to balance; incr time due to weakness Ambulation/Gait Ambulation/Gait assistance: Min guard Ambulation Distance (Feet): 20 Feet Assistive device: Rolling walker (2 wheeled) Gait Pattern/deviations: Step-through pattern, Decreased stride length, Wide base of support, Shuffle General Gait Details: unsteady; intermittent c/o dizziness; min guard to (A) with RW management; O2 desat to 56% on 6L during ambulation; recoverd with 5 min recovery time in sitting to 90% Gait velocity: very decreased Gait velocity interpretation: Below normal speed for age/gender    ADL:    Cognition: Cognition Overall Cognitive Status: Within Functional Limits for tasks assessed Cognition Arousal/Alertness: Awake/alert Behavior During Therapy: Anxious Overall Cognitive Status: Within Functional Limits for tasks assessed  Blood pressure 127/63, pulse 74, temperature 97.8 F (36.6 C), temperature source Oral, resp. rate 17, height 5\' 4"  (1.626 m), weight 130.318 kg (287 lb 4.8 oz), SpO2 92 %. Physical Exam  Constitutional: She is oriented to person, place, and time.  [age] year old right-handed obese female  HENT:  Head: Normocephalic.  Eyes: EOM are normal.  Neck: Normal range of motion. Neck supple. No thyromegaly present.  Cardiovascular:  Cardiac rate controlled  Respiratory:  Decreased breath sounds at the bases  GI: Soft. Bowel sounds are normal. She exhibits no distension.  Neurological: She is alert and oriented to person, place, and time. No cranial nerve deficit. Coordination normal.  Follows commands. UES 5/5 prox to distal. LES: 2/5 HF, 3/5 KE and 4/5 ankles. Decreased LT bilateral distal legs/feet.   Skin:  Skin is warm and dry.  Multiple scars, wounds on both legs. Numerous bruises and ecchymoses on the UE's.   Psychiatric: She has a normal mood and affect. Her behavior is normal.   ischemic changes to lower extremities.  Results for orders placed or performed during the hospital encounter of 11/16/14 (from the past 24 hour(s))  Basic metabolic panel     Status: Abnormal   Collection Time: 11/22/14  6:55 PM  Result Value Ref Range   Sodium 136 135 - 145 mmol/L   Potassium 5.7 (H) 3.5 - 5.1 mmol/L   Chloride 85 (L) 101 - 111 mmol/L   CO2 39 (H) 22 - 32 mmol/L   Glucose, Bld 273 (H) 65 -  99 mg/dL   BUN 70 (H) 6 - 20 mg/dL   Creatinine, Ser 1.61 (H) 0.44 - 1.00 mg/dL   Calcium 9.0 8.9 - 09.6 mg/dL   GFR calc non Af Amer 25 (L) >60 mL/min   GFR calc Af Amer 29 (L) >60 mL/min   Anion gap 12 5 - 15  Basic metabolic panel     Status: Abnormal   Collection Time: 11/22/14 10:05 PM  Result Value Ref Range   Sodium 139 135 - 145 mmol/L   Potassium 3.6 3.5 - 5.1 mmol/L   Chloride 86 (L) 101 - 111 mmol/L   CO2 41 (H) 22 - 32 mmol/L   Glucose, Bld 209 (H) 65 - 99 mg/dL   BUN 75 (H) 6 - 20 mg/dL   Creatinine, Ser 0.45 (H) 0.44 - 1.00 mg/dL   Calcium 9.4 8.9 - 40.9 mg/dL   GFR calc non Af Amer 23 (L) >60 mL/min   GFR calc Af Amer 26 (L) >60 mL/min   Anion gap 12 5 - 15  Protime-INR     Status: Abnormal   Collection Time: 11/23/14  5:21 AM  Result Value Ref Range   Prothrombin Time 25.1 (H) 11.6 - 15.2 seconds   INR 2.30 (H) 0.00 - 1.49  Magnesium     Status: Abnormal   Collection Time: 11/23/14  5:21 AM  Result Value Ref Range   Magnesium 2.6 (H) 1.7 - 2.4 mg/dL  Phosphorus     Status: None   Collection Time: 11/23/14  5:21 AM  Result Value Ref Range   Phosphorus 4.5 2.5 - 4.6 mg/dL  CBC with Differential/Platelet     Status: Abnormal   Collection Time: 11/23/14  5:21 AM  Result Value Ref Range   WBC 12.9 (H) 4.0 - 10.5 K/uL   RBC 4.48 3.87 - 5.11 MIL/uL   Hemoglobin 14.5 12.0 -  15.0 g/dL   HCT 81.1 91.4 - 78.2 %   MCV 100.2 (H) 78.0 - 100.0 fL   MCH 32.4 26.0 - 34.0 pg   MCHC 32.3 30.0 - 36.0 g/dL   RDW 95.6 (H) 21.3 - 08.6 %   Platelets 226 150 - 400 K/uL   Neutrophils Relative % 94 (H) 43 - 77 %   Neutro Abs 12.1 (H) 1.7 - 7.7 K/uL   Lymphocytes Relative 2 (L) 12 - 46 %   Lymphs Abs 0.3 (L) 0.7 - 4.0 K/uL   Monocytes Relative 4 3 - 12 %   Monocytes Absolute 0.5 0.1 - 1.0 K/uL   Eosinophils Relative 0 0 - 5 %   Eosinophils Absolute 0.0 0.0 - 0.7 K/uL   Basophils Relative 0 0 - 1 %   Basophils Absolute 0.0 0.0 - 0.1 K/uL  Basic metabolic panel     Status: Abnormal   Collection Time: 11/23/14  5:21 AM  Result Value Ref Range   Sodium 140 135 - 145 mmol/L   Potassium 3.2 (L) 3.5 - 5.1 mmol/L   Chloride 88 (L) 101 - 111 mmol/L   CO2 42 (H) 22 - 32 mmol/L   Glucose, Bld 161 (H) 65 - 99 mg/dL   BUN 70 (H) 6 - 20 mg/dL   Creatinine, Ser 5.78 (H) 0.44 - 1.00 mg/dL   Calcium 9.0 8.9 - 46.9 mg/dL   GFR calc non Af Amer 29 (L) >60 mL/min   GFR calc Af Amer 33 (L) >60 mL/min   Anion gap 10 5 - 15   Dg Chest  Port 1 View  11/22/2014   CLINICAL DATA:  Edema and shortness of breath; acute on chronic respiratory failure.  EXAM: PORTABLE CHEST - 1 VIEW  COMPARISON:  Portable chest x-ray of July 9th 2016  FINDINGS: The lungs are better inflated today. There remain coarse interstitial opacities bilaterally. The left hemidiaphragm remains obscured. Small bilateral pleural effusions persist. The cardiac silhouette pulmonary vascularity are engorged. The mediastinum is less prominent in width today. The trachea is midline. The bony thorax exhibits no acute abnormality.  IMPRESSION: There has been mild interval improvement in the appearance of the pulmonary interstitium which may indicate improving edema or pneumonia. There remain small bilateral pleural effusions.   Electronically Signed   By: David  [place]aziland M.D.   On: 11/22/2014 07:44    Assessment/Plan: Diagnosis: debility  due to acute on chronic respiratory failure 1. Does the need for close, 24 hr/day medical supervision in concert with the patient's rehab needs make it unreasonable for this patient to be served in a less intensive setting? Yes 2. Co-Morbidities requiring supervision/potential complications: peripheral, gout, severe PF,  3. Due to bladder management, bowel management, safety, skin/wound care, disease management, medication administration, pain management and patient education, does the patient require 24 hr/day rehab nursing? Yes 4. Does the patient require coordinated care of a physician, rehab nurse, PT (1-2 hrs/day, 5 days/week) and OT (1-2 hrs/day, 5 days/week) to address physical and functional deficits in the context of the above medical diagnosis(es)? Yes Addressing deficits in the following areas: balance, endurance, locomotion, strength, transferring, bowel/bladder control, bathing, dressing, feeding, grooming and toileting 5. Can the patient actively participate in an intensive therapy program of at least 3 hrs of therapy per day at least 5 days per week? Yes 6. The potential for patient to make measurable gains while on inpatient rehab is excellent 7. Anticipated functional outcomes upon discharge from inpatient rehab are modified independent  with PT, modified independent with OT, n/a with SLP. 8. Estimated rehab length of stay to reach the above functional goals is: 7 days 9. Does the patient have adequate social supports and living environment to accommodate these discharge functional goals? Yes 10. Anticipated D/C setting: Home 11. Anticipated post D/C treatments: HH therapy and Outpatient therapy 12. Overall Rehab/Functional Prognosis: excellent  RECOMMENDATIONS: This patient's condition is appropriate for continued rehabilitative care in the following setting: CIR Patient has agreed to participate in recommended program. Yes Note that insurance prior authorization may be required  for reimbursement for recommended care.  Comment: Rehab Admissions Coordinator to follow up.  Thanks,  Ranelle Oyster, MD, [place] [facility]     11/23/2014

## 2014-11-23 NOTE — Telephone Encounter (Signed)
Started PA for Esbriet 267 mg. Received request from [facility] ZO:[id number]:[id number] [employer] JWJ:[id number]:[id number]

## 2014-11-23 NOTE — Progress Notes (Addendum)
PULMONARY / CRITICAL CARE MEDICINE   Name: [name] [name] MRN: [id number] DOB: 01/26/1932    ADMISSION DATE:  11/16/2014 CONSULTATION DATE:  11/16/2014  REFERRING MD :  [name], pulmonary office  CHIEF COMPLAINT:  Shortness of breath  INITIAL PRESENTATION: 81 year old female with a past medical history significant for pulmonary fibrosis believed to be usual interstitial pneumonitis as well as diastolic heart failure was admitted from the pulmonary office on 11/16/2014 with a fever, cough, and worsening hypoxemic respiratory failure.  STUDIES:  7/7 echo 60%, pa 55, RV appears dilated and hypertrophied On parasternal images ? mass in RV but I think this is just a tangential cut of thickened RV. Can consider f/u MRI or TEE if clinically indicated  11/19/14: Confirmed to have PIV-3 infection.  SUBJECTIVE/OVERNIGHT/INTERVAL HX Feeling some better.  Still with SOB above baseline but slowly improving.  Still c/o BLE edema above baseline.   VITAL SIGNS: Temp:  [97.5 F (36.4 C)-98.4 F (36.9 C)] 97.8 F (36.6 C) (07/13 0901) Pulse Rate:  [74-117] 74 (07/13 0901) Resp:  [17-20] 17 (07/13 0901) BP: (120-140)/(52-67) 127/63 mmHg (07/13 0901) SpO2:  [91 %-94 %] 92 % (07/13 0901) Weight:  [287 lb 4.8 oz (130.318 kg)] 287 lb 4.8 oz (130.318 kg) (07/12 2051) HEMODYNAMICS:   VENTILATOR SETTINGS:   INTAKE / OUTPUT:  Intake/Output Summary (Last 24 hours) at 11/23/14 1022 Last data filed at 11/23/14 1014  Gross per 24 hour  Intake   1440 ml  Output   3410 ml  Net  -1970 ml    PHYSICAL EXAMINATION: General:  In chair , obese, looks deconditioned, no distress Neuro:  Awake, alert, no distress HEENT:  Obese neck Cardiovascular:s1 s2 RRR no r Lungs:  resps even non labored on 6L [place], diminished bases, no audible wheeze  Abdomen:  Bowel sounds positive nontender nondistended Musculoskeletal:  Normal bulk and tone Skin:  Chronic right leg wound well healed no oozing, 1-2+ BLE edema    LABS:  PULMONARY No results for input(s): PHART, PCO2ART, PO2ART, HCO3, TCO2, O2SAT in the last 168 hours.  Invalid input(s): PCO2, PO2  CBC  Recent Labs Lab 11/21/14 0229 11/22/14 0431 11/23/14 0521  HGB 14.6 14.4 14.5  HCT 45.9 45.3 44.9  WBC 13.0* 13.1* 12.9*  PLT 221 239 226    COAGULATION  Recent Labs Lab 11/19/14 0216 11/20/14 0220 11/21/14 0229 11/22/14 0431 11/23/14 0521  INR 2.00* 2.07* 2.56* 2.72* 2.30*    CARDIAC    Recent Labs Lab 11/16/14 1851 11/16/14 2328 11/17/14 1231  TROPONINI 0.04* 0.04* 0.03   No results for input(s): PROBNP in the last 168 hours.   CHEMISTRY  Recent Labs Lab 11/16/14 1851  11/20/14 0220 11/21/14 0229 11/22/14 0431 11/22/14 1855 11/22/14 2205 11/23/14 0521  NA 139  < >  --  142 142 136 139 140  K 4.4  < >  --  3.6 2.9* 5.7* 3.6 3.2*  CL 96*  < >  --  89* 86* 85* 86* 88*  CO2 33*  < >  --  42* 43* 39* 41* 42*  GLUCOSE 118*  < >  --  140* 148* 273* 209* 161*  BUN 23*  < >  --  47* 60* 70* 75* 70*  CREATININE 1.25*  < >  --  1.34* 1.43* 1.83* 1.96* 1.62*  CALCIUM 8.9  < >  --  9.3 9.4 9.0 9.4 9.0  MG 2.4  --  2.5* 2.4 2.4  --   --  2.6*  PHOS 3.2  --  3.2 3.8 4.6  --   --  4.5  < > = values in this interval not displayed. Estimated Creatinine Clearance: 36.5 mL/min (by C-G formula based on Cr of 1.62).   LIVER  Recent Labs Lab 11/16/14 1851  11/19/14 0216 11/20/14 0220 11/21/14 0229 11/22/14 0431 11/23/14 0521  AST 34  --  41  --   --   --   --   ALT 35  --  44  --   --   --   --   ALKPHOS 51  --  48  --   --   --   --   BILITOT 0.6  --  0.7  --   --   --   --   PROT 6.2*  --  6.1*  --   --   --   --   ALBUMIN 3.2*  --  3.0*  --   --   --   --   INR 3.28*  < > 2.00* 2.07* 2.56* 2.72* 2.30*  < > = values in this interval not displayed.   INFECTIOUS  Recent Labs Lab 11/16/14 1851  PROCALCITON 0.28     ENDOCRINE CBG (last 3)  No results for input(s): GLUCAP in the last 72  hours.   Dg Chest Port 1 View  11/22/2014   CLINICAL DATA:  Edema and shortness of breath; acute on chronic respiratory failure.  EXAM: PORTABLE CHEST - 1 VIEW  COMPARISON:  Portable chest x-ray of July 9th 2016  FINDINGS: The lungs are better inflated today. There remain coarse interstitial opacities bilaterally. The left hemidiaphragm remains obscured. Small bilateral pleural effusions persist. The cardiac silhouette pulmonary vascularity are engorged. The mediastinum is less prominent in width today. The trachea is midline. The bony thorax exhibits no acute abnormality.  IMPRESSION: There has been mild interval improvement in the appearance of the pulmonary interstitium which may indicate improving edema or pneumonia. There remain small bilateral pleural effusions.   Electronically Signed   By: David  [place]and M.D.   On: 11/22/2014 07:44     ASSESSMENT / PLAN:  Acute respiratory failure with hypoxemia: has severe, end stage ILD and now likely acute viral pneumonitis.  Despite known diagnosis of CHF she has less swelling now than she has had in months.    - Dx is  Acute viral pneumonitis >? ILD flare - due tp Parainfluenza-3 Viral infection.  PLAN > keep solumedrol but taper as able (reduced 7/13) - maintenance pred /day which was recently decreased and pt feels she needs more > Lasix  TID, continue as renal function allows. > BIPAP qHS and PRN > incentive spirometry > intermittent CXR > CIR evaluation, suspect she will be a good candidate. Pt would like to speak also with case management to insure that she has benefit coverage for CIR.    Chronic diastolic  congestive heart failure> As noted above this is not clearly an exacerbation of her heart failure at admit   PLAN > Lasix as above > follow daily basic metabolic panel in am  > ContinueZaroxolyn  paroxysmal A. Fib > Continue diltiazem > Continue warfarin  History of recurrent DVT > continue warfarin  OSA/OHVS > BIPAP  qHS  Hypokalemia: secondary to diuresis > potassium supplementation scheduled with lasix and additional PRN    Code Status: Full. Per discussions thus far with Dr BQ and MR 11/19/14   DISPO:  PT to see. Consider CIR.  FAMILY  - Updates: Discussed care with patient 7/13  - Inter-disciplinary family meet or Palliative Care meeting due by:  MR discussed 11/19/2014 in prsence of RN. She recollects meeting MR in office for ILD 2nd opinion and reports she did not like EOL and code status and goals of care conversations she had with him at that time. She reports she has to provide $ for grandauther in vet school etc. And still has to look forward about life. Says full code . She understands very poor prognosis wit h lif expectancy of several months    Dirk Dress, NP 11/23/2014  10:22 AM Pager: [phone] or [phone]    Attending Note:  I have examined patient, reviewed labs, studies and notes. I have discussed the case with Jasper Riling, and I agree with the data and plans as amended above.   Levy Pupa, MD, PhD 11/23/2014, 2:22 PM [facility] [phone] or if no answer [phone]

## 2014-11-23 NOTE — Plan of Care (Signed)
Problem: Phase I Progression Outcomes Goal: Discharge plan established Outcome: Progressing Possible discharge to acute inpatient rehab. Pending final authorization.

## 2014-11-23 NOTE — Progress Notes (Addendum)
ANTICOAGULATION CONSULT NOTE - Follow Up Consult  Pharmacy Consult for coumadin  Indication: hx dvt/pe, afib  Labs:  Recent Labs  11/21/14 0229 11/22/14 0431 11/22/14 1855 11/22/14 2205 11/23/14 0521  HGB 14.6 14.4  --   --  14.5  HCT 45.9 45.3  --   --  44.9  PLT 221 239  --   --  226  LABPROT 27.2* 28.4*  --   --  25.1*  INR 2.56* 2.72*  --   --  2.30*  CREATININE 1.34* 1.43* 1.83* 1.96* 1.62*    Estimated Creatinine Clearance: 36.5 mL/min (by C-G formula based on Cr of 1.62).   Assessment:  Continues on Coumadin for hx afib, DVT and PE.  INR is therapeutic (2.3) today, but above her low outpatient goal of 1.8-2.2.  Expect INR may continue to fall some after low dose given yesterday.     Home Coumadin dose:  3.75 mg daily (1.5 tabs of 2.5 mg)   Goal of Therapy:  Per outpatient records, INR goal 1.8-2.2 Monitor platelets by anticoagulation protocol: Yes   Plan:   Coumadin 2 mg x 1.  Continue daily PT/INR.  Tad MooreJessica Sharmane Dame, Pharm D, BCPS  Clinical Pharmacist Pager [phone]  11/23/2014 8:07 AM

## 2014-11-23 NOTE — Care Management Note (Signed)
Case Management Note  Patient Details  Name: [name] [name] MRN: [id number] Date of Birth: 30-Jan-1932  Subjective/Objective:            CM following for progression and d/c planning.        Action/Plan: 11/23/2014 Met with pt re CIR admission. This pt with questions re her cost, CM assured pt that CIR admission coord would visit and answer any questions re cost , LOS etc.   Expected Discharge Date:       11/24/2014           Expected Discharge Plan:  CIR  In-House Referral:     Discharge planning Services  CM Consult  Post Acute Care Choice:  Resumption of Svcs/PTA Provider Choice offered to:     DME Arranged:    DME Agency:     HH Arranged:    HH Agency:     Status of Service:  In process, will continue to follow  Medicare Important Message Given:  Yes-third notification given Date Medicare IM Given:    Medicare IM give by:    Date Additional Medicare IM Given:    Additional Medicare Important Message give by:     If discussed at [place] of Stay Meetings, dates discussed:    Additional Comments:  Adron Bene, RN 11/23/2014, 3:25 PM

## 2014-11-23 NOTE — Evaluation (Signed)
Physical Therapy Evaluation Patient Details Name: [name] [name] MRN: [id number] DOB: 1931-11-18 Today's Date: 11/23/2014   History of Present Illness  81 year old female with a past medical history significant for pulmonary fibrosis believed to be usual interstitial pneumonitis as well as diastolic heart failure was admitted from the pulmonary office on 11/16/2014 with a fever, cough, and worsening hypoxemic respiratory failure. Pt on Bipap at night. Pt with A-fib noted on 11/22/14.   Clinical Impression  Pt adm due to above. Pt with significant decline in functional mobility due to poor overall activity tolerance and cardiopulmonary status. Pt to benefit from skilled acute PT to maximize functional mobility prior to D/C. Pt with great family support at home and home health aide for 5 hours a day 5 days a week. Pt motivated to increase strength prior to returning home with family. Believe pt to be a good rehab candidate to address deficits prior to returning home. IF pt is not a candidate, pt will need max HH services upon D/C.     Follow Up Recommendations CIR    Equipment Recommendations  None recommended by PT    Recommendations for Other Services OT consult;Rehab consult     Precautions / Restrictions Precautions Precautions: Fall Precaution Comments: monitor O2 sats Restrictions Weight Bearing Restrictions: No      Mobility  Bed Mobility               General bed mobility comments: up in chair  Transfers Overall transfer level: Needs assistance Equipment used: Rolling walker (2 wheeled) Transfers: Sit to/from Stand Sit to Stand: Min guard         General transfer comment: unsteady; min guard to balance; incr time due to weakness  Ambulation/Gait Ambulation/Gait assistance: Min guard Ambulation Distance (Feet): 20 Feet Assistive device: Rolling walker (2 wheeled) Gait Pattern/deviations: Step-through pattern;Decreased stride length;Wide base of  support;Shuffle Gait velocity: very decreased Gait velocity interpretation: Below normal speed for age/gender General Gait Details: unsteady; intermittent c/o dizziness; min guard to (A) with RW management; O2 desat to 56% on 6L during ambulation; recoverd with 5 min recovery time in sitting to 90%  Stairs            Wheelchair Mobility    Modified Rankin (Stroke Patients Only)       Balance Overall balance assessment: Needs assistance;History of Falls Sitting-balance support: Feet supported;No upper extremity supported Sitting balance-Leahy Scale: Good     Standing balance support: During functional activity;Bilateral upper extremity supported Standing balance-Leahy Scale: Poor Standing balance comment: RW for B UE support at all times to balance                             Pertinent Vitals/Pain Pain Assessment: No/denies pain    Home Living Family/patient expects to be discharged to:: Private residence Living Arrangements: Children;Non-relatives/Friends Available Help at Discharge: Family;Personal care attendant;Available 24 hours/day Type of Home: House Home Access: Stairs to enter Entrance Stairs-Rails: Left;Right;Can reach both Entrance Stairs-Number of Steps: 2 Home Layout: One level Home Equipment: Shower seat - built in;Grab bars - tub/shower;Walker - 2 wheels;Transport chair;Bedside commode;Other (comment);Adaptive equipment Additional Comments: son and daughter in-law live with pt; has aide 5 hours daily x 5 days a week    Prior Function Level of Independence: Needs assistance   Gait / Transfers Assistance Needed: Uses RW and has lift chair and elevated height bed.    ADL's / Homemaking Assistance Needed: (A) with cooking/cleaning;  reports she can dress and bathe herself   Comments: pt reports incr difficulty ambulating to/from bathroom at home; very anxious and fearful of falling     Hand Dominance   Dominant Hand: Right     Extremity/Trunk Assessment   Upper Extremity Assessment: Defer to OT evaluation           Lower Extremity Assessment: Generalized weakness      Cervical / Trunk Assessment: Kyphotic;Other exceptions  Communication   Communication: No difficulties  Cognition Arousal/Alertness: Awake/alert Behavior During Therapy: Anxious Overall Cognitive Status: Within Functional Limits for tasks assessed                      General Comments General comments (skin integrity, edema, etc.): educated on energy conservation techniques    Exercises General Exercises - Lower Extremity Ankle Circles/Pumps: AROM;Both;10 reps      Assessment/Plan    PT Assessment Patient needs continued PT services  PT Diagnosis Difficulty walking;Generalized weakness   PT Problem List Decreased strength;Decreased activity tolerance;Decreased balance;Decreased mobility;Decreased knowledge of use of DME;Obesity  PT Treatment Interventions DME instruction;Gait training;Stair training;Functional mobility training;Therapeutic activities;Therapeutic exercise;Balance training;Neuromuscular re-education;Patient/family education   PT Goals (Current goals can be found in the Care Plan section) Acute Rehab PT Goals Patient Stated Goal: to get stronger so i dont have to be a burden to my kids PT Goal Formulation: With patient Time For Goal Achievement: 12/07/14 Potential to Achieve Goals: Good    Frequency Min 3X/week   Barriers to discharge Decreased caregiver support;Inaccessible home environment does not want to burden children    Co-evaluation               End of Session Equipment Utilized During Treatment: Gait belt;Oxygen Activity Tolerance: Patient tolerated treatment well Patient left: in chair;with call bell/phone within reach Nurse Communication: Mobility status;Precautions         Time: [id number] PT Time Calculation (min) (ACUTE ONLY): 23 min   Charges:   PT  Evaluation $Initial PT Evaluation Tier I: 1 Procedure PT Treatments $Gait Training: 8-22 mins   PT G Codes:        Selim Durden, [place] N PT  11/23/2014, 12:01 PM

## 2014-11-23 NOTE — Progress Notes (Signed)
Physical medicine rehabilitation consult requested with chart reviewed. Patient admitted for acute respiratory failure with hypoxemia. Physical  therapy evaluations ordered today. Await therapy evaluations to be completed and follow-up with appropriate recommendations

## 2014-11-23 NOTE — Progress Notes (Signed)
Pt is on BIPAP at this time tolerating it well 14/8. RN aware

## 2014-11-23 NOTE — Consult Note (Signed)
   [facility]   11/23/2014  [name] [name] 12/07/1931 [id number]   [facility].  Made bedside visit to speak with her about [facility]. [facility], patient states, " I have all I need with home health". Declines any additional services or assistance from [facility]. States she has [facility]. Left brochure at bedside. Made inpatient RNCM aware.  Raiford NobleAtika Caelin Rosen, MSN-Ed, RN,BSN [facility] Care Management Hospital Liaison [phone]

## 2014-11-24 ENCOUNTER — Inpatient Hospital Stay (HOSPITAL_COMMUNITY): Payer: Medicare Other

## 2014-11-24 ENCOUNTER — Inpatient Hospital Stay (HOSPITAL_COMMUNITY)
Admission: RE | Admit: 2014-11-24 | Discharge: 2014-12-02 | DRG: 196 | Disposition: A | Discharge status: Home Health | Payer: Medicare Other | Source: Intra-hospital | Attending: Physical Medicine & Rehabilitation | Admitting: Physical Medicine & Rehabilitation

## 2014-11-24 ENCOUNTER — Telehealth: Payer: Self-pay | Admitting: Pulmonary Disease

## 2014-11-24 DIAGNOSIS — I129 Hypertensive chronic kidney disease with stage 1 through stage 4 chronic kidney disease, or unspecified chronic kidney disease: Secondary | ICD-10-CM | POA: Diagnosis present

## 2014-11-24 DIAGNOSIS — I5032 Chronic diastolic (congestive) heart failure: Secondary | ICD-10-CM | POA: Diagnosis present

## 2014-11-24 DIAGNOSIS — Z7901 Long term (current) use of anticoagulants: Secondary | ICD-10-CM | POA: Diagnosis not present

## 2014-11-24 DIAGNOSIS — E876 Hypokalemia: Secondary | ICD-10-CM | POA: Diagnosis present

## 2014-11-24 DIAGNOSIS — R5381 Other malaise: Secondary | ICD-10-CM | POA: Diagnosis present

## 2014-11-24 DIAGNOSIS — N189 Chronic kidney disease, unspecified: Secondary | ICD-10-CM | POA: Diagnosis present

## 2014-11-24 DIAGNOSIS — J8410 Pulmonary fibrosis, unspecified: Principal | ICD-10-CM | POA: Diagnosis present

## 2014-11-24 DIAGNOSIS — I480 Paroxysmal atrial fibrillation: Secondary | ICD-10-CM | POA: Diagnosis present

## 2014-11-24 DIAGNOSIS — E785 Hyperlipidemia, unspecified: Secondary | ICD-10-CM | POA: Diagnosis present

## 2014-11-24 DIAGNOSIS — F329 Major depressive disorder, single episode, unspecified: Secondary | ICD-10-CM | POA: Diagnosis present

## 2014-11-24 DIAGNOSIS — T502X5A Adverse effect of carbonic-anhydrase inhibitors, benzothiadiazides and other diuretics, initial encounter: Secondary | ICD-10-CM | POA: Diagnosis not present

## 2014-11-24 DIAGNOSIS — Z22322 Carrier or suspected carrier of Methicillin resistant Staphylococcus aureus: Secondary | ICD-10-CM | POA: Diagnosis not present

## 2014-11-24 DIAGNOSIS — Z86718 Personal history of other venous thrombosis and embolism: Secondary | ICD-10-CM | POA: Diagnosis not present

## 2014-11-24 DIAGNOSIS — I4891 Unspecified atrial fibrillation: Secondary | ICD-10-CM | POA: Diagnosis present

## 2014-11-24 DIAGNOSIS — Z9981 Dependence on supplemental oxygen: Secondary | ICD-10-CM | POA: Diagnosis not present

## 2014-11-24 DIAGNOSIS — I5031 Acute diastolic (congestive) heart failure: Secondary | ICD-10-CM | POA: Diagnosis not present

## 2014-11-24 DIAGNOSIS — J9621 Acute and chronic respiratory failure with hypoxia: Secondary | ICD-10-CM | POA: Diagnosis present

## 2014-11-24 DIAGNOSIS — J849 Interstitial pulmonary disease, unspecified: Secondary | ICD-10-CM | POA: Diagnosis not present

## 2014-11-24 DIAGNOSIS — J810 Acute pulmonary edema: Secondary | ICD-10-CM | POA: Insufficient documentation

## 2014-11-24 DIAGNOSIS — J129 Viral pneumonia, unspecified: Secondary | ICD-10-CM | POA: Diagnosis not present

## 2014-11-24 LAB — CBC WITH DIFFERENTIAL/PLATELET
BASOS ABS: 0 10*3/uL (ref 0.0–0.1)
BASOS PCT: 0 % (ref 0–1)
EOS PCT: 0 % (ref 0–5)
Eosinophils Absolute: 0 10*3/uL (ref 0.0–0.7)
HEMATOCRIT: 46.2 % — AB (ref 36.0–46.0)
Hemoglobin: 15 g/dL (ref 12.0–15.0)
LYMPHS ABS: 0.5 10*3/uL — AB (ref 0.7–4.0)
LYMPHS PCT: 4 % — AB (ref 12–46)
MCH: 32.5 pg (ref 26.0–34.0)
MCHC: 32.5 g/dL (ref 30.0–36.0)
MCV: 100 fL (ref 78.0–100.0)
MONOS PCT: 2 % — AB (ref 3–12)
Monocytes Absolute: 0.3 10*3/uL (ref 0.1–1.0)
Neutro Abs: 14.2 10*3/uL — ABNORMAL HIGH (ref 1.7–7.7)
Neutrophils Relative %: 94 % — ABNORMAL HIGH (ref 43–77)
Platelets: 258 10*3/uL (ref 150–400)
RBC: 4.62 MIL/uL (ref 3.87–5.11)
RDW: 16.5 % — ABNORMAL HIGH (ref 11.5–15.5)
WBC: 15 10*3/uL — ABNORMAL HIGH (ref 4.0–10.5)

## 2014-11-24 LAB — BASIC METABOLIC PANEL
Anion gap: 11 (ref 5–15)
BUN: 73 mg/dL — AB (ref 6–20)
CALCIUM: 9.4 mg/dL (ref 8.9–10.3)
CO2: 44 mmol/L — AB (ref 22–32)
Chloride: 87 mmol/L — ABNORMAL LOW (ref 101–111)
Creatinine, Ser: 1.61 mg/dL — ABNORMAL HIGH (ref 0.44–1.00)
GFR calc Af Amer: 33 mL/min — ABNORMAL LOW (ref >60)
GFR calc non Af Amer: 29 mL/min — ABNORMAL LOW (ref >60)
GLUCOSE: 166 mg/dL — AB (ref 65–99)
POTASSIUM: 3.6 mmol/L (ref 3.5–5.1)
Sodium: 142 mmol/L (ref 135–145)

## 2014-11-24 LAB — PROTIME-INR
INR: 1.97 — ABNORMAL HIGH (ref 0.00–1.49)
Prothrombin Time: 22.3 seconds — ABNORMAL HIGH (ref 11.6–15.2)

## 2014-11-24 MED ORDER — ASPIRIN EC 81 MG PO TBEC
81.0000 mg | DELAYED_RELEASE_TABLET | Freq: Every day | ORAL | Status: DC
  Administered 2014-11-25 – 2014-12-02 (×8): 81 mg via ORAL
  Filled 2014-11-24 (×9): qty 1

## 2014-11-24 MED ORDER — LORATADINE 10 MG PO TABS
10.0000 mg | ORAL_TABLET | Freq: Every day | ORAL | Status: DC
  Administered 2014-11-25 – 2014-12-02 (×8): 10 mg via ORAL
  Filled 2014-11-24 (×11): qty 1

## 2014-11-24 MED ORDER — POTASSIUM CHLORIDE CRYS ER 20 MEQ PO TBCR
40.0000 meq | EXTENDED_RELEASE_TABLET | Freq: Three times a day (TID) | ORAL | Status: DC
Start: 2014-11-24 — End: 2014-12-02

## 2014-11-24 MED ORDER — ALLOPURINOL 300 MG PO TABS
300.0000 mg | ORAL_TABLET | Freq: Every day | ORAL | Status: DC
  Administered 2014-11-25 – 2014-12-02 (×8): 300 mg via ORAL
  Filled 2014-11-24 (×9): qty 1

## 2014-11-24 MED ORDER — FUROSEMIDE 40 MG PO TABS
60.0000 mg | ORAL_TABLET | Freq: Three times a day (TID) | ORAL | Status: DC
  Administered 2014-11-24 – 2014-11-28 (×13): 60 mg via ORAL
  Filled 2014-11-24 (×19): qty 1

## 2014-11-24 MED ORDER — ALBUTEROL SULFATE (2.5 MG/3ML) 0.083% IN NEBU: 2.5000 mg | INHALATION_SOLUTION | RESPIRATORY_TRACT | Status: DC | PRN

## 2014-11-24 MED ORDER — ONDANSETRON HCL 4 MG/2ML IJ SOLN: 4.0000 mg | Freq: Four times a day (QID) | INTRAMUSCULAR | Status: DC | PRN

## 2014-11-24 MED ORDER — METHYLPREDNISOLONE SODIUM SUCC 40 MG IJ SOLR
40.0000 mg | Freq: Two times a day (BID) | INTRAMUSCULAR | Status: DC
  Administered 2014-11-25 (×2): 40 mg via INTRAVENOUS
  Filled 2014-11-24 (×3): qty 1

## 2014-11-24 MED ORDER — WARFARIN SODIUM 3 MG PO TABS
3.0000 mg | ORAL_TABLET | Freq: Once | ORAL | Status: AC
  Administered 2014-11-24: 3 mg via ORAL
  Filled 2014-11-24: qty 1

## 2014-11-24 MED ORDER — BUDESONIDE 0.25 MG/2ML IN SUSP
0.2500 mg | Freq: Two times a day (BID) | RESPIRATORY_TRACT | Status: DC
  Administered 2014-11-24 – 2014-12-02 (×16): 0.25 mg via RESPIRATORY_TRACT
  Filled 2014-11-24 (×34): qty 2

## 2014-11-24 MED ORDER — FAMOTIDINE 20 MG PO TABS
20.0000 mg | ORAL_TABLET | Freq: Every day | ORAL | Status: DC
  Administered 2014-11-25 – 2014-12-02 (×8): 20 mg via ORAL
  Filled 2014-11-24 (×9): qty 1

## 2014-11-24 MED ORDER — WARFARIN - PHARMACIST DOSING INPATIENT
Freq: Every day | Status: DC
  Administered 2014-11-26 – 2014-12-02 (×3)

## 2014-11-24 MED ORDER — METHYLPREDNISOLONE SODIUM SUCC 40 MG IJ SOLR
40.0000 mg | Freq: Two times a day (BID) | INTRAMUSCULAR | Status: DC
Start: 2014-11-24 — End: 2014-12-02

## 2014-11-24 MED ORDER — ESCITALOPRAM OXALATE 20 MG PO TABS
20.0000 mg | ORAL_TABLET | Freq: Every day | ORAL | Status: DC
  Administered 2014-11-25 – 2014-12-02 (×8): 20 mg via ORAL
  Filled 2014-11-24 (×10): qty 1

## 2014-11-24 MED ORDER — METOLAZONE 5 MG PO TABS
5.0000 mg | ORAL_TABLET | Freq: Every day | ORAL | Status: DC
  Administered 2014-11-25 – 2014-12-02 (×8): 5 mg via ORAL
  Filled 2014-11-24 (×10): qty 1

## 2014-11-24 MED ORDER — MUPIROCIN 2 % EX OINT
1.0000 "application " | TOPICAL_OINTMENT | Freq: Two times a day (BID) | CUTANEOUS | Status: AC
  Administered 2014-11-24 – 2014-11-26 (×5): 1 via NASAL
  Filled 2014-11-24: qty 22

## 2014-11-24 MED ORDER — WARFARIN SODIUM 3 MG PO TABS
3.0000 mg | ORAL_TABLET | Freq: Once | ORAL | Status: AC
Start: 2014-11-24 — End: 2014-11-24
  Administered 2014-11-24: 3 mg via ORAL
  Filled 2014-11-24: qty 1

## 2014-11-24 MED ORDER — ONDANSETRON HCL 4 MG PO TABS: 4.0000 mg | ORAL_TABLET | Freq: Four times a day (QID) | ORAL | Status: DC | PRN

## 2014-11-24 MED ORDER — ACETAMINOPHEN 325 MG PO TABS
650.0000 mg | ORAL_TABLET | ORAL | Status: DC | PRN
  Administered 2014-11-25: 650 mg via ORAL
  Filled 2014-11-24 (×2): qty 2

## 2014-11-24 MED ORDER — DILTIAZEM HCL ER COATED BEADS 240 MG PO CP24
240.0000 mg | ORAL_CAPSULE | Freq: Every day | ORAL | Status: DC
  Administered 2014-11-25 – 2014-12-02 (×8): 240 mg via ORAL
  Filled 2014-11-24 (×9): qty 1

## 2014-11-24 MED ORDER — ATORVASTATIN CALCIUM 10 MG PO TABS
10.0000 mg | ORAL_TABLET | Freq: Every day | ORAL | Status: DC
  Administered 2014-11-25 – 2014-12-02 (×8): 10 mg via ORAL
  Filled 2014-11-24 (×9): qty 1

## 2014-11-24 MED ORDER — CHLORHEXIDINE GLUCONATE CLOTH 2 % EX PADS: 6.0000 | MEDICATED_PAD | Freq: Every day | CUTANEOUS | Status: DC

## 2014-11-24 MED ORDER — ALBUTEROL SULFATE (2.5 MG/3ML) 0.083% IN NEBU
2.5000 mg | INHALATION_SOLUTION | Freq: Four times a day (QID) | RESPIRATORY_TRACT | Status: DC
Start: 2014-11-24 — End: 2014-11-25
  Administered 2014-11-24 – 2014-11-25 (×4): 2.5 mg via RESPIRATORY_TRACT
  Filled 2014-11-24 (×4): qty 3

## 2014-11-24 MED ORDER — FERROUS SULFATE 325 (65 FE) MG PO TABS
325.0000 mg | ORAL_TABLET | Freq: Two times a day (BID) | ORAL | Status: DC
  Administered 2014-11-25 – 2014-12-02 (×16): 325 mg via ORAL
  Filled 2014-11-24 (×17): qty 1

## 2014-11-24 MED ORDER — FUROSEMIDE 20 MG PO TABS
60.0000 mg | ORAL_TABLET | Freq: Three times a day (TID) | ORAL | Status: DC
Start: 2014-11-24 — End: 2014-12-02

## 2014-11-24 MED ORDER — WARFARIN SODIUM 3 MG PO TABS: 3.0000 mg | ORAL_TABLET | Freq: Once | ORAL | Status: DC

## 2014-11-24 MED ORDER — SORBITOL 70 % SOLN: 30.0000 mL | Freq: Every day | Status: DC | PRN

## 2014-11-24 MED ORDER — GABAPENTIN 300 MG PO CAPS
300.0000 mg | ORAL_CAPSULE | Freq: Every day | ORAL | Status: DC
  Administered 2014-11-25 – 2014-12-02 (×8): 300 mg via ORAL
  Filled 2014-11-24 (×9): qty 1

## 2014-11-24 MED ORDER — ALBUTEROL SULFATE (2.5 MG/3ML) 0.083% IN NEBU: 2.5000 mg | INHALATION_SOLUTION | Freq: Four times a day (QID) | RESPIRATORY_TRACT | Status: DC

## 2014-11-24 NOTE — Progress Notes (Signed)
PMR Admission Coordinator Pre-Admission Assessment  Patient: [name] [name] is an 81 y.o., female MRN: [id number] DOB: [birth year] Height: 5\' 4"  (162.6 cm) Weight: 131.2 kg (289 lb 3.9 oz)  Insurance Information HMO: PPO: PCP: IPA: 80/20: yes OTHER: no HMO PRIMARY: Medicare a and b Policy#: [id number] a Subscriber: pt Benefits: Phone #: palmetto online Name: 11/24/14 Eff. Date: 09/11/1998 Deduct: $1288 Out of Pocket Max: none Life Max: none CIR: 100% SNF: 20 full days Outpatient: 80% Co-Pay: 20% Home Health: 100% Co-Pay: none DME: 80% Co-Pay: 20% Providers: pt choice  SECONDARY: none  Medicaid Application Date: Case Manager:  Disability Application Date: Case Worker:   Emergency Contact Information Contact Information    Name Relation Home Work Mobile   Valcarcel,Philip Son   [phone]   Sora, Vrooman  [phone]   Lorilyn, Laitinen   [phone]   Radford,Amy Relative   [phone]     Current Medical History  Patient Admitting Diagnosis: debility due to acute on chronic respiratory failure  History of Present Illness: [name] [name] is a 81 y.o. right handed female with history of chronic renal insufficiency with creatinine 1.19, pulmonary fibrosis with home oxygen of 5 L, diastolic congestive heart failure, chronic atrial fibrillation maintained on Coumadin therapy. Patient lives with her son and also home health aide 5 hours a day 5 days a week. She used a walker to help aid in mobility.  Presented 11/16/2014 with increasing shortness of breath, fever ,cough. Chest x-ray with no acute abnormalities no evidence of pulmonary edema. Appearance consistent with chronic interstitial lung disease. Pulmonary  service follow-up suspect acute viral pneumonitis placed on Solu-Medrol taper continues with diuresis. Echocardiogram with ejection fraction of 60% normal systolic function. Chronic Coumadin ongoing.   Past Medical History  Past Medical History  Diagnosis Date  . Congestive heart failure, unspecified     a. ECHO 6/0: EF 55-60%, mild LVH, mildly dilated RV w. mildly dec fx, RVSP 67  . Chronic atrial fibrillation     failed cardioversion in past -repeat DC-CV on October 5th, 2010  . Personal history of DVT (deep vein thrombosis) 2008    LLE  . HTN (hypertension)   . Morbid obesity   . Shingles     with postherpetic neuraigia  . GERD (gastroesophageal reflux disease)   . Hyperlipidemia   . Meningitis ~ 1960    hospitalized @ [facility]  . Gallstones     s/p cholecystectomy  . CAD (coronary artery disease)     nonobstructive by cath 8/10 - LAD 40-50% w mild PAH mean 29 w PVR 3.2 [place]  . PE (pulmonary embolism) ~ 2010    LLE  . Shortness of breath dyspnea   . Lung interstitial disease   . Hypoxemia   . COPD (chronic obstructive pulmonary disease)   . On home oxygen therapy     "5L; 24/7" (10/11/2014)  . OSA treated with BiPAP     "I think it's BiPAP"  . AAA (abdominal aortic aneurysm)     small  . History of blood transfusion   . Anemia   . Iron deficiency anemia   . History of blood transfusion     "related to nose bleed"  . Gout   . Depression   . Chronic kidney disease (CKD), stage II (mild)     Family History  family history includes Depression in her mother; Heart disease in her paternal grandfather; Heart failure in her mother; Obesity in her other; Ovarian cancer in  her paternal grandmother.  Prior Rehab/Hospitalizations:  Has the patient had major surgery during 100 days prior to admission? [facility] after a RLE injury from a  fall. Wound complex and [facility].  Current Medications   Current facility-administered medications:  . 0.9 % sodium chloride infusion, 250 mL, Intravenous, PRN, Lupita Leashouglas B McQuaid, MD . 0.9 % sodium chloride infusion, 250 mL, Intravenous, PRN, Lupita Leashouglas B McQuaid, MD, Last Rate: 10 mL/hr at 11/17/14 1411, 250 mL at 11/17/14 1411 . acetaminophen (TYLENOL) tablet 650 mg, 650 mg, Oral, Q4H PRN, Lupita Leashouglas B McQuaid, MD, 650 mg at 11/20/14 2242 . albuterol (PROVENTIL) (2.5 MG/3ML) 0.083% nebulizer solution 2.5 mg, 2.5 mg, Nebulization, Q2H PRN, Lupita Leashouglas B McQuaid, MD, 2.5 mg at 11/20/14 2339 . albuterol (PROVENTIL) (2.5 MG/3ML) 0.083% nebulizer solution 2.5 mg, 2.5 mg, Nebulization, Q6H, Lupita Leashouglas B McQuaid, MD, 2.5 mg at 11/24/14 1429 . allopurinol (ZYLOPRIM) tablet 300 mg, 300 mg, Oral, Daily, Lupita Leashouglas B McQuaid, MD, 300 mg at 11/24/14 1050 . aspirin EC tablet 81 mg, 81 mg, Oral, Daily, Lupita Leashouglas B McQuaid, MD, 81 mg at 11/24/14 1050 . atorvastatin (LIPITOR) tablet 10 mg, 10 mg, Oral, Daily, Lupita Leashouglas B McQuaid, MD, 10 mg at 11/24/14 1049 . budesonide (PULMICORT) nebulizer solution 0.25 mg, 0.25 mg, Nebulization, BID, Lupita Leashouglas B McQuaid, MD, 0.25 mg at 11/24/14 0917 . chlorhexidine (PERIDEX) 0.12 % solution 15 mL, 15 mL, Mouth Rinse, BID, Lupita Leashouglas B McQuaid, MD, 15 mL at 11/24/14 1050 . Chlorhexidine Gluconate Cloth 2 % PADS 6 each, 6 each, Topical, Q0600, Scarlett Prestoheresa D Egan, RPH, 6 each at 11/24/14 0600 . diltiazem (CARDIZEM CD) 24 hr capsule 240 mg, 240 mg, Oral, Daily, Lupita Leashouglas B McQuaid, MD, 240 mg at 11/24/14 1051 . escitalopram (LEXAPRO) tablet 20 mg, 20 mg, Oral, Daily, Lupita Leashouglas B McQuaid, MD, 20 mg at 11/24/14 1051 . famotidine (PEPCID) tablet 20 mg, 20 mg, Oral, Daily, Lupita Leashouglas B McQuaid, MD, 20 mg at 11/24/14 1049 . ferrous sulfate tablet 325 mg, 325 mg, Oral, BID WC, Lupita Leashouglas B McQuaid, MD, 325 mg at 11/24/14  0857 . furosemide (LASIX) injection 60 mg, 60 mg, Intravenous, 3 times per day, Nelda Bucksaniel J Feinstein, MD, 60 mg at 11/24/14 1442 . gabapentin (NEURONTIN) capsule 300 mg, 300 mg, Oral, Daily, Lupita Leashouglas B McQuaid, MD, 300 mg at 11/24/14 1049 . loratadine (CLARITIN) tablet 10 mg, 10 mg, Oral, Daily, Lupita Leashouglas B McQuaid, MD, 10 mg at 11/24/14 1049 . methylPREDNISolone sodium succinate (SOLU-MEDROL) 40 mg/mL injection 40 mg, 40 mg, Intravenous, Q12H, Bernadene PersonKathryn A Whiteheart, NP, 40 mg at 11/24/14 1301 . metolazone (ZAROXOLYN) tablet 5 mg, 5 mg, Oral, Daily, Lupita Leashouglas B McQuaid, MD, 5 mg at 11/24/14 1049 . mupirocin ointment (BACTROBAN) 2 % 1 application, 1 application, Nasal, BID, Md Pccm, MD, 1 application at 11/24/14 1054 . ondansetron (ZOFRAN) injection 4 mg, 4 mg, Intravenous, Q6H PRN, Lupita Leashouglas B McQuaid, MD . potassium chloride SA (K-DUR,KLOR-CON) CR tablet 40 mEq, 40 mEq, Oral, TID, Duayne CalPaul W Hoffman, NP, 40 mEq at 11/24/14 1049 . sodium chloride 0.9 % injection 3 mL, 3 mL, Intravenous, Q12H, Lupita Leashouglas B McQuaid, MD, 3 mL at 11/24/14 1442 . sodium chloride 0.9 % injection 3 mL, 3 mL, Intravenous, PRN, Lupita Leashouglas B McQuaid, MD, 3 mL at 11/23/14 1451 . warfarin (COUMADIN) tablet 3 mg, 3 mg, Oral, ONCE-1800, Scarlett Prestoheresa D Egan, RPH . Warfarin - Pharmacist Dosing Inpatient, , Does not apply, q1800, Homero FellersFrank  Azell Der, RPH  Patients Current Diet: Diet 2 gram sodium Room service appropriate?: Yes; Fluid consistency:: Thin  Precautions / Restrictions Precautions Precautions: Fall Precaution Comments: monitor O2 sats Restrictions Weight Bearing Restrictions: No   Has the patient had 2 or more falls or a fall with injury in the past year?No. Uses RW at home and has been greater than a year since last fall. Previous falls related to rollator RW getting away from her or non use of RW. Pt now uses RW all the time with 2 wheels only.  Prior Activity Level Household: only in community for MD appointments  Home  Assistive Devices / Equipment Home Assistive Devices/Equipment: BIPAP, Eyeglasses, Grab bars around toilet, Grab bars in shower, Hand-held shower hose, Oxygen, Shower chair without back, Environmental consultant (specify type) Home Equipment: Shower seat - built in, [employer] - tub/shower, Environmental consultant - 2 wheels, Transport chair, Bedside commode, Other (comment), Adaptive equipment  Prior Device Use: Indicate devices/aids used by the patient prior to current illness, exacerbation or injury? Walker Pt in community only for MD appointments. Ambulates only for less than 50 feet to go to bathroom. Has a chair halfway point for sitting for energy conservation due to pulmonary fibrosis. Does get into shower and bathes upper body herself. Cortney then assists with lower body bathing and dressing. Cortney does the cooking and light housekeeping and shopping. Lift chair use to stand. Dtr in law , AMy, assists on the weekend. Son, Thayer Ohm, 70 yo in the home with MS is Independent and self sufficient. He can provide supervision. Son Harvie Heck and Loistine Chance are local and very supportive.  Prior Functional Level Prior Function Level of Independence: Needs assistance Gait / Transfers Assistance Needed: Uses RW and has lift chair and elevated height bed.  ADL's / Homemaking Assistance Needed: Has Cortney hired 5 hrs per day 5 days per week for cooking cleaning, and assist with LB bathing and dressing Comments: pt reports incr difficulty ambulating to/from bathroom at home; very anxious and fearful of falling  Self Care: Did the patient need help bathing, dressing, using the toilet or eating? Needed some help  Indoor Mobility: Did the patient need assistance with walking from room to room (with or without device)? Needed some help  Stairs: Did the patient need assistance with internal or external stairs (with or without device)? Needed some help able to reach both rails to entry of home. Turns sideways and able to maintain balance to do the  2 steps. Son, Harvie Heck, provide all transportation to all MD appointments.  Functional Cognition: Did the patient need help planning regular tasks such as shopping or remembering to take medications? Needed some help Cortney cooks, cleans, shops, etc  Current Functional Level Cognition  Overall Cognitive Status: Within Functional Limits for tasks assessed   Extremity Assessment (includes Sensation/Coordination)  Upper Extremity Assessment: Defer to OT evaluation  Lower Extremity Assessment: Generalized weakness    ADLs       Mobility  General bed mobility comments: up in chair    Transfers  Overall transfer level: Needs assistance Equipment used: Rolling walker (2 wheeled) Transfers: Sit to/from Stand Sit to Stand: Min guard General transfer comment: unsteady; min guard to balance; incr time due to weakness    Ambulation / Gait / Stairs / Wheelchair Mobility  Ambulation/Gait Ambulation/Gait assistance: Hydrographic surveyor (Feet): 20 Feet Assistive device: Rolling walker (2 wheeled) Gait Pattern/deviations: Step-through pattern, Decreased stride length, Wide base of support, Shuffle General Gait Details: unsteady; intermittent c/o  dizziness; min guard to (A) with RW management; O2 desat to 56% on 6L during ambulation; recoverd with 5 min recovery time in sitting to 90% Gait velocity: very decreased Gait velocity interpretation: Below normal speed for age/gender    Posture / Balance Balance Overall balance assessment: Needs assistance, History of Falls Sitting-balance support: Feet supported, No upper extremity supported Sitting balance-Leahy Scale: Good Standing balance support: During functional activity, Bilateral upper extremity supported Standing balance-Leahy Scale: Poor Standing balance comment: RW for B UE support at all times to balance    Special needs/care consideration BiPAP/CPAP BIPAP pta Oxygen O2 at 5 liters [place] at baseline Bowel  mgmt: LBM 7/14 continent Bladder mgmt: continent but urgency with lasix. Usually does not wear pants or underwear due to urgency. Wears gowns. Wears panties and pads when going to MD appointments. Contact Precautions/MRSA   Previous Home Environment Living Arrangements: Children, Other (Comment) (pt's son, Thayer Ohm, lives with pt. Has MS. Independent) Lives With: Son Available Help at Discharge: Family, Available 24 hours/day Type of Home: House Home Layout: One level Home Access: Stairs to enter Entrance Stairs-Rails: Left, Right, Can reach both Entrance Stairs-Number of Steps: 2 Bathroom Shower/Tub: Walk-in shower, Other (comment) (has seat and grab bars with hand shower) Bathroom Toilet: Handicapped height Bathroom Accessibility: Yes Home Care Services: Other (Comment) (Has RN 2 Xs per week for VS, labs, etc) Type of Home Care Services: Other (Comment) (aide, Cortney paid privately ) Additional Comments: son, Thayer Ohm, lives with pt. two other sons local very supportive  Discharge Living Setting Plans for Discharge Living Setting: Patient's home, Lives with (comment), Other (Comment) (son, Thayer Ohm, lives with pt) Type of Home at Discharge: House Discharge Home Layout: One level Discharge Home Access: Stairs to enter Entrance Stairs-Rails: Right, Left, Can reach both Entrance Stairs-Number of Steps: 2 Discharge Bathroom Shower/Tub: Walk-in shower, Other (comment) (with seat, grab bars, and shower head) Discharge Bathroom Toilet: Handicapped height Discharge Bathroom Accessibility: Yes How Accessible: Accessible via walker Does the patient have any problems obtaining your medications?: No  Social/Family/Support Systems Patient Roles: Spouse, Parent Contact Information: Loistine Chance and his wife, Amy, primary. Son Harvie Heck also Anticipated Caregiver: Cortney, private aide 5 hrs per day mon-fri. Son Thayer Ohm can do supervision 24/7, Amy, dtr in law assists on the weekends and prn Anticipated  Caregiver's Contact Information: see above Ability/Limitations of Caregiver: Thayer Ohm, 70 yo son lives with pt has MS and is Independendt. Amy helps weekends Caregiver Availability: 24/7 Discharge Plan Discussed with Primary Caregiver: Yes Is Caregiver In Agreement with Plan?: Yes Does Caregiver/Family have Issues with Lodging/Transportation while Pt is in Rehab?: No Cortney, aide 5 hrs per day 5 days per week assistance. Thayer Ohm there 24/7 can provide supervision level. Loistine Chance and his wife, Amy, assists prn and on the weekends. Very supportive local sons.  Goals/Additional Needs Patient/Family Goal for Rehab: Mod I with PT and OT Expected length of stay: ELOS 7 days Equipment Needs: MRSA Contact Isolation Pt/Family Agrees to Admission and willing to participate: Yes Program Orientation Provided & Reviewed with Pt/Caregiver Including Roles & Responsibilities: Yes  Decrease burden of Care through IP rehab admission: n/a  Possible need for SNF placement upon discharge:not anticipated. Pt would refuse  Patient Condition: This patient's condition remains as documented in the consult dated 11/24/2014, in which the Rehabilitation Physician determined and documented that the patient's condition is appropriate for intensive rehabilitative care in an inpatient rehabilitation facility. Will admit to inpatient rehab today.  Preadmission Screen Completed By: Clois Dupes, 11/24/2014  3:08 PM ______________________________________________________________________  Discussed status with Dr. Wynn Banker on 11/24/2014 at 1508 and received telephone approval for admission today.  Admission Coordinator: Clois Dupes, time 6295 Date 11/24/2014.          Cosigned by: Erick Colace, MD at 11/24/2014 3:24 PM  Revision History     Date/Time User Provider Type Action   11/24/2014 3:24 PM Erick Colace, MD Physician Cosign   11/24/2014 3:08 PM Standley Brooking, RN Rehab  Admission Coordinator Sign

## 2014-11-24 NOTE — Progress Notes (Signed)
PULMONARY / CRITICAL CARE MEDICINE   Name: [name] [name] MRN: [id number] DOB: 11/03/1931    ADMISSION DATE:  11/16/2014 CONSULTATION DATE:  11/16/2014  REFERRING MD :  [name], pulmonary office  CHIEF COMPLAINT:  Shortness of breath  INITIAL PRESENTATION: 81 year old female with a past medical history significant for pulmonary fibrosis believed to be usual interstitial pneumonitis as well as diastolic heart failure was admitted from the pulmonary office on 11/16/2014 with a fever, cough, and worsening hypoxemic respiratory failure.  STUDIES:  7/7 echo 60%, pa 55, RV appears dilated and hypertrophied On parasternal images ? mass in RV but I think this is just a tangential cut of thickened RV. Can consider f/u MRI or TEE if clinically indicated  11/19/14: Confirmed to have PIV-3 infection.  SUBJECTIVE/OVERNIGHT/INTERVAL HX Feeling better today. Was evaluated for CIR vs SNF, she is currently refusing this and wants to go home with her already established home health regimen.   VITAL SIGNS: Temp:  [97.5 F (36.4 C)-98.3 F (36.8 C)] 97.5 F (36.4 C) (07/14 0458) Pulse Rate:  [66-86] 66 (07/14 0458) Resp:  [17-19] 19 (07/14 0458) BP: (125-142)/(57-59) 130/57 mmHg (07/14 0458) SpO2:  [90 %-96 %] 92 % (07/14 0918) Weight:  [131.2 kg (289 lb 3.9 oz)] 131.2 kg (289 lb 3.9 oz) (07/14 0458) HEMODYNAMICS:   VENTILATOR SETTINGS:   INTAKE / OUTPUT:  Intake/Output Summary (Last 24 hours) at 11/24/14 1010 Last data filed at 11/24/14 [id number]  Gross per 24 hour  Intake   1080 ml  Output   2750 ml  Net  -1670 ml    PHYSICAL EXAMINATION: General:  In chair , obese, looks deconditioned, no distress Neuro:  Awake, alert, no distress HEENT:  Obese neck Cardiovascular:s1 s2 RRR no r Lungs:  resps even non labored on 6L [place], diminished bases, no audible wheeze  Abdomen:  Bowel sounds positive nontender nondistended Musculoskeletal:  Normal bulk and tone Skin:  Chronic right leg wound well  healed no oozing, 1-2+ BLE edema   LABS:  PULMONARY No results for input(s): PHART, PCO2ART, PO2ART, HCO3, TCO2, O2SAT in the last 168 hours.  Invalid input(s): PCO2, PO2  CBC  Recent Labs Lab 11/22/14 0431 11/23/14 0521 11/24/14 0517  HGB 14.4 14.5 15.0  HCT 45.3 44.9 46.2*  WBC 13.1* 12.9* 15.0*  PLT 239 226 258    COAGULATION  Recent Labs Lab 11/20/14 0220 11/21/14 0229 11/22/14 0431 11/23/14 0521 11/24/14 0517  INR 2.07* 2.56* 2.72* 2.30* 1.97*    CARDIAC    Recent Labs Lab 11/17/14 1231  TROPONINI 0.03   No results for input(s): PROBNP in the last 168 hours.   CHEMISTRY  Recent Labs Lab 11/20/14 0220 11/21/14 0229 11/22/14 0431 11/22/14 1855 11/22/14 2205 11/23/14 0521 11/24/14 0517  NA  --  142 142 136 139 140 142  K  --  3.6 2.9* 5.7* 3.6 3.2* 3.6  CL  --  89* 86* 85* 86* 88* 87*  CO2  --  42* 43* 39* 41* 42* 44*  GLUCOSE  --  140* 148* 273* 209* 161* 166*  BUN  --  47* 60* 70* 75* 70* 73*  CREATININE  --  1.34* 1.43* 1.83* 1.96* 1.62* 1.61*  CALCIUM  --  9.3 9.4 9.0 9.4 9.0 9.4  MG 2.5* 2.4 2.4  --   --  2.6*  --   PHOS 3.2 3.8 4.6  --   --  4.5  --    Estimated Creatinine Clearance: 36.9 mL/min (by C-G  formula based on Cr of 1.61).   LIVER  Recent Labs Lab 11/19/14 0216 11/20/14 0220 11/21/14 0229 11/22/14 0431 11/23/14 0521 11/24/14 0517  AST 41  --   --   --   --   --   ALT 44  --   --   --   --   --   ALKPHOS 48  --   --   --   --   --   BILITOT 0.7  --   --   --   --   --   PROT 6.1*  --   --   --   --   --   ALBUMIN 3.0*  --   --   --   --   --   INR 2.00* 2.07* 2.56* 2.72* 2.30* 1.97*     INFECTIOUS No results for input(s): LATICACIDVEN, PROCALCITON in the last 168 hours.   ENDOCRINE CBG (last 3)  No results for input(s): GLUCAP in the last 72 hours.   Dg Chest Portable 1 View  11/24/2014   CLINICAL DATA:  Respiratory failure  EXAM: PORTABLE CHEST - 1 VIEW  COMPARISON:  Portable chest x-ray of November 22, 2014  FINDINGS: The lungs are reasonably well inflated. The interstitial markings are coarse and remain increased bilaterally. The left lower lobe is dense and there is obscuration of the left hemidiaphragm consistent with pleural fluid. The cardiac silhouette is enlarged. The pulmonary vascularity is prominent centrally. There is pleural fluid versus pleural thickening over the superior lateral aspects of both lungs. The bony thorax exhibits no acute abnormality.  IMPRESSION: There has not been significant change since yesterday's study. CHF with small bilateral pleural effusions and interstitial edema and/or bilateral interstitial pneumonia persists.   Electronically Signed   By: David  Swaziland M.D.   On: 11/24/2014 07:42     ASSESSMENT / PLAN:  Acute respiratory failure with hypoxemia: has severe, end stage ILD and now likely acute viral pneumonitis.  Despite known diagnosis of CHF she has less swelling now than she has had in months.    - Dx is  Acute viral pneumonitis >? ILD flare - due tp Parainfluenza-3 Viral infection.  PLAN > keep solumedrol but taper as able (reduced 7/13) - can probably resume prednisone tomorrow if no decline > was on pred taper to off over 3 months prior to admission.  > Lasix  TID, continue as renal function allows. > BIPAP qHS and PRN, she feels BiPAP here is weaker than home. She will call home to get settings.  > incentive spirometry > intermittent CXR > Good CIR candidate, PT recommends this, patient would prefer this  Chronic diastolic  congestive heart failure> As noted above this is not clearly an exacerbation of her heart failure at admit   PLAN > Lasix as above > follow daily basic metabolic panel in am  > ContinueZaroxolyn  paroxysmal A. Fib > Continue diltiazem > Continue warfarin  History of recurrent DVT > continue warfarin  OSA/OHVS > BIPAP qHS  Hypokalemia: secondary to diuresis > potassium supplementation scheduled with lasix and  additional PRN    Code Status: Full. Per discussions thus far with Dr BQ and MR 11/19/14   DISPO:  PT rec's CIR, CIR eval pending   FAMILY  - Updates: Discussed care with patient 7/14  - Inter-disciplinary family meet or Palliative Care meeting due by:    Joneen Roach, AGACNP-BC [facility] Pager [phone] or [phone]  11/24/2014  10:15 AM

## 2014-11-24 NOTE — Telephone Encounter (Signed)
Made in error.Stanley A Dalton ° °

## 2014-11-24 NOTE — Care Management Important Message (Signed)
Important Message  Patient Details  Name: [name] [name] MRN: [id number] Date of Birth: 01/04/1932   Medicare Important Message Given:  Yes-fourth notification given    RoyalAnnamarie Major, Zakhi Dupre U, RN 11/24/2014, 10:51 AM

## 2014-11-24 NOTE — Progress Notes (Signed)
Physical Medicine and Rehabilitation Consult Reason for Consult: Debilitation related to acute respiratory failure with hypoxemia/medical Referring Physician: Pulmonary services   HPI: [name] [name] is a 81 y.o. right handed female with history of chronic renal insufficiency with creatinine 1.19, pulmonary fibrosis with home oxygen of 5 L, diastolic congestive heart failure, chronic atrial fibrillation maintained on Coumadin therapy. Patient lives with her son and also home health aide 5 hours a day 5 days a week. She used a walker to help aid in mobility. Presented 11/16/2014 with increasing shortness of breath, fever ,cough. Chest x-ray with no acute abnormalities no evidence of pulmonary edema. Appearance consistent with chronic interstitial lung disease. Pulmonary service follow-up suspect acute viral pneumonitis placed on Solu-Medrol taper continues with diuresis. Echocardiogram with ejection fraction of 60% normal systolic function. Chronic Coumadin ongoing. Contact precautions for MRSA PCR screening positive. Physical therapy evaluation completed 11/23/2014 with recommendations of physical medicine rehabilitation consult.   Review of Systems  Constitutional: Positive for fever. Negative for chills.  Eyes: Negative for blurred vision and double vision.  Respiratory: Positive for cough and shortness of breath.  Cardiovascular: Positive for palpitations and leg swelling.  Gastrointestinal: Positive for heartburn and constipation.   GERD  Musculoskeletal: Positive for myalgias and joint pain.  Skin: Negative for rash.  Neurological: Negative for dizziness and headaches.  Psychiatric/Behavioral: Positive for depression.   Past Medical History  Diagnosis Date  . Congestive heart failure, unspecified     a. ECHO 6/0: EF 55-60%, mild LVH, mildly dilated RV w. mildly dec fx, RVSP 67  . Chronic atrial fibrillation     failed cardioversion in past -repeat DC-CV on October  5th, 2010  . Personal history of DVT (deep vein thrombosis) 2008    LLE  . HTN (hypertension)   . Morbid obesity   . Shingles     with postherpetic neuraigia  . GERD (gastroesophageal reflux disease)   . Hyperlipidemia   . Meningitis ~ 1960    hospitalized @ [facility]  . Gallstones     s/p cholecystectomy  . CAD (coronary artery disease)     nonobstructive by cath 8/10 - LAD 40-50% w mild PAH mean 29 w PVR 3.2 [place]  . PE (pulmonary embolism) ~ 2010    LLE  . Shortness of breath dyspnea   . Lung interstitial disease   . Hypoxemia   . COPD (chronic obstructive pulmonary disease)   . On home oxygen therapy     "5L; 24/7" (10/11/2014)  . OSA treated with BiPAP     "I think it's BiPAP"  . AAA (abdominal aortic aneurysm)     small  . History of blood transfusion   . Anemia   . Iron deficiency anemia   . History of blood transfusion     "related to nose bleed"  . Gout   . Depression   . Chronic kidney disease (CKD), stage II (mild)    Past Surgical History  Procedure Laterality Date  . Appendectomy  1947  . Tonsillectomy  1946  . Cardioversion  01/2007    x2  . Right heart catheterization N/A 02/28/2014    Procedure: RIGHT HEART CATH; Surgeon: Laurey Morale, MD; Location: [facility] CATH LAB; Service: Cardiovascular; Laterality: N/A;  . Cardiac catheterization  02/2014  . Cataract extraction w/ intraocular lens implant, bilateral Bilateral    Family History  Problem Relation Age of Onset  . Heart failure Mother   . Depression Mother   . Ovarian cancer  Paternal Grandmother   . Heart disease Paternal Grandfather   . Obesity Other    Social History:  reports that she has never smoked. She has never used smokeless tobacco. She reports that she drinks alcohol. She reports that she does not use illicit  drugs. Allergies:  Allergies  Allergen Reactions  . Pirfenidone Other (See Comments)    Congestion, breathing diffulty   Medications Prior to Admission  Medication Sig Dispense Refill  . albuterol (PROVENTIL) (2.5 MG/3ML) 0.083% nebulizer solution Take 3 mLs (2.5 mg total) by nebulization every 2 (two) hours as needed for wheezing or shortness of breath. 75 mL 12  . allopurinol (ZYLOPRIM) 300 MG tablet Take 1 tablet (300 mg total) by mouth daily. 30 tablet 11  . aspirin 81 MG tablet Take 81 mg by mouth daily.    . atorvastatin (LIPITOR) 10 MGMarland Kitchen tablet Take 1 tablet (10 mg total) by mouth daily. 30 tablet 5  . budesonide (PULMICORT) 0.25 MG/2ML nebulizer solution Take 0.25 mg by nebulization 2 (two) times daily.    . cetirizine (ZYRTEC ALLERGY) 10 MG tablet Take 10 mg by mouth daily.     . colchicine 0.6 MG tablet Take 0.6 mg by mouth as needed (for flareup).     [name]. diltiazem (CARDIZEM CD) 240 MG 24 hr capsule Take 1 capsule (240 mg total) by mouth daily. 30 capsule 0  . escitalopram (LEXAPRO) 20 MG tablet Take 1 tablet (20 mg total) by mouth daily. 30 tablet 5  . famotidine (PEPCID) 20 MG tablet Take 1 tablet (20 mg total) by mouth daily. 30 tablet 6  . ferrous sulfate 325 (65 FE) MG tablet Take 1 tablet (325 mg total) by mouth 2 (two) times daily with a meal. 60 tablet 0  . furosemide (LASIX) 80 MG tablet Take 1 tablet (80 mg total) by mouth every 8 (eight) hours. 90 tablet 1  . gabapentin (NEURONTIN) 300 MG capsule Take 1 capsule (300 mg total) by mouth daily. 90 capsule 0  . Multiple Vitamins-Minerals (MULTIVITAMIN & MINERAL PO) Take 1 tablet by mouth daily.    [name]. nystatin (MYCOSTATIN/NYSTOP) 100000 UNIT/GM POWD Apply to flank and groin and under breasts BID (Patient taking differently: Apply topically 2 (two) times daily as needed (fungal areas). Apply to flank and groin and under breasts BID) 60 g 2  .  Polyethyl Glycol-Propyl Glycol (SYSTANE OP) Place 1 drop into both eyes daily as needed (dry eyes).    . potassium chloride SA (K-DUR,KLOR-CON) 20 MEQ tablet Take 20 mEq by mouth daily.    . predniSONE (DELTASONE) 5 MG tablet Take 15 mg daily for one month Take 10 mg daily for one month Take 5 mg daily for one month (Patient taking differently: Take 15 mg by mouth daily with breakfast. Take 15 mg daily for one month Take 10 mg daily for one month Take 5 mg daily for one month) 180 tablet 0  . warfarin (COUMADIN) 2.5 MG tablet Take as directed. (Patient taking differently: Take 3.75 mg by mouth daily at 6 PM. Take as directed.) 90 tablet 0  . metolazone (ZAROXOLYN) 5 MG tablet Take 1 tablet (5 mg total) by mouth daily as needed. (Patient taking differently: Take 5 mg by mouth daily as needed (for fluid retention when weight reaches 295). ) 30 tablet 1  . OVER THE COUNTER MEDICATION Inhale 1 application into the lungs at bedtime. USES BIPAP EVERY BEDTIME FOR SLEEP      Home: Home Living Family/patient expects to  be discharged to:: Private residence Living Arrangements: Children, Non-relatives/Friends Available Help at Discharge: Family, Personal care attendant, Available 24 hours/day Type of Home: House Home Access: Stairs to enter [employer] of Steps: 2 Entrance Stairs-Rails: Left, Right, Can reach both Home Layout: One level Bathroom Shower/Tub: Health visitor: Handicapped height Bathroom Accessibility: Yes Home Equipment: Information systems manager - built in, [employer] - tub/shower, Environmental consultant - 2 wheels, Transport chair, Bedside commode, Other (comment), Adaptive equipment Adaptive Equipment: Reacher, Sock aid, Long-handled shoe horn, Long-handled sponge Additional Comments: son and daughter in-law live with pt; has aide 5 hours daily x 5 days a week  Functional History: Prior Function Level of Independence: Needs assistance Gait / Transfers  Assistance Needed: Uses RW and has lift chair and elevated height bed.  ADL's / Homemaking Assistance Needed: (A) with cooking/cleaning; reports she can dress and bathe herself  Comments: pt reports incr difficulty ambulating to/from bathroom at home; very anxious and fearful of falling Functional Status:  Mobility: Bed Mobility General bed mobility comments: up in chair Transfers Overall transfer level: Needs assistance Equipment used: Rolling walker (2 wheeled) Transfers: Sit to/from Stand Sit to Stand: Min guard General transfer comment: unsteady; min guard to balance; incr time due to weakness Ambulation/Gait Ambulation/Gait assistance: Min guard Ambulation Distance (Feet): 20 Feet Assistive device: Rolling walker (2 wheeled) Gait Pattern/deviations: Step-through pattern, Decreased stride length, Wide base of support, Shuffle General Gait Details: unsteady; intermittent c/o dizziness; min guard to (A) with RW management; O2 desat to 56% on 6L during ambulation; recoverd with 5 min recovery time in sitting to 90% Gait velocity: very decreased Gait velocity interpretation: Below normal speed for age/gender    ADL:    Cognition: Cognition Overall Cognitive Status: Within Functional Limits for tasks assessed Cognition Arousal/Alertness: Awake/alert Behavior During Therapy: Anxious Overall Cognitive Status: Within Functional Limits for tasks assessed  Blood pressure 127/63, pulse 74, temperature 97.8 F (36.6 C), temperature source Oral, resp. rate 17, height  (1.626 m), weight 130.318 kg (287 lb 4.8 oz), SpO2 92 %. Physical Exam  Constitutional: She is oriented to person, place, and time.  81 year old right-handed obese female  HENT:  Head: Normocephalic.  Eyes: EOM are normal.  Neck: Normal range of motion. Neck supple. No thyromegaly present.  Cardiovascular:  Cardiac rate controlled  Respiratory:  Decreased breath sounds at the bases  GI: Soft. Bowel sounds  are normal. She exhibits no distension.  Neurological: She is alert and oriented to person, place, and time. No cranial nerve deficit. Coordination normal.  Follows commands. UES 5/5 prox to distal. LES: 2/5 HF, 3/5 KE and 4/5 ankles. Decreased LT bilateral distal legs/feet.  Skin: Skin is warm and dry.  Multiple scars, wounds on both legs. Numerous bruises and ecchymoses on the UE's.  Psychiatric: She has a normal mood and affect. Her behavior is normal.   ischemic changes to lower extremities.   Lab Results Last 24 Hours    Results for orders placed or performed during the hospital encounter of 11/16/14 (from the past 24 hour(s))  Basic metabolic panel Status: Abnormal   Collection Time: 11/22/14 6:55 PM  Result Value Ref Range   Sodium 136 135 - 145 mmol/L   Potassium 5.7 (H) 3.5 - 5.1 mmol/L   Chloride 85 (L) 101 - 111 mmol/L   CO2 39 (H) 22 - 32 mmol/L   Glucose, Bld 273 (H) 65 - 99 mg/dL   BUN 70 (H) 6 - 20 mg/dL   Creatinine, Ser  1.83 (H) 0.44 - 1.00 mg/dL   Calcium 9.0 8.9 - 16.1 mg/dL   GFR calc non Af Amer 25 (L) >60 mL/min   GFR calc Af Amer 29 (L) >60 mL/min   Anion gap 12 5 - 15  Basic metabolic panel Status: Abnormal   Collection Time: 11/22/14 10:05 PM  Result Value Ref Range   Sodium 139 135 - 145 mmol/L   Potassium 3.6 3.5 - 5.1 mmol/L   Chloride 86 (L) 101 - 111 mmol/L   CO2 41 (H) 22 - 32 mmol/L   Glucose, Bld 209 (H) 65 - 99 mg/dL   BUN 75 (H) 6 - 20 mg/dL   Creatinine, Ser 0.96 (H) 0.44 - 1.00 mg/dL   Calcium 9.4 8.9 - 04.5 mg/dL   GFR calc non Af Amer 23 (L) >60 mL/min   GFR calc Af Amer 26 (L) >60 mL/min   Anion gap 12 5 - 15  Protime-INR Status: Abnormal   Collection Time: 11/23/14 5:21 AM  Result Value Ref Range   Prothrombin Time 25.1 (H) 11.6 - 15.2 seconds   INR 2.30 (H) 0.00 - 1.49  Magnesium Status: Abnormal     Collection Time: 11/23/14 5:21 AM  Result Value Ref Range   Magnesium 2.6 (H) 1.7 - 2.4 mg/dL  Phosphorus Status: None   Collection Time: 11/23/14 5:21 AM  Result Value Ref Range   Phosphorus 4.5 2.5 - 4.6 mg/dL  CBC with Differential/Platelet Status: Abnormal   Collection Time: 11/23/14 5:21 AM  Result Value Ref Range   WBC 12.9 (H) 4.0 - 10.5 K/uL   RBC 4.48 3.87 - 5.11 MIL/uL   Hemoglobin 14.5 12.0 - 15.0 g/dL   HCT 40.9 81.1 - 91.4 %   MCV 100.2 (H) 78.0 - 100.0 fL   MCH 32.4 26.0 - 34.0 pg   MCHC 32.3 30.0 - 36.0 g/dL   RDW 78.2 (H) 95.6 - 21.3 %   Platelets 226 150 - 400 K/uL   Neutrophils Relative % 94 (H) 43 - 77 %   Neutro Abs 12.1 (H) 1.7 - 7.7 K/uL   Lymphocytes Relative 2 (L) 12 - 46 %   Lymphs Abs 0.3 (L) 0.7 - 4.0 K/uL   Monocytes Relative 4 3 - 12 %   Monocytes Absolute 0.5 0.1 - 1.0 K/uL   Eosinophils Relative 0 0 - 5 %   Eosinophils Absolute 0.0 0.0 - 0.7 K/uL   Basophils Relative 0 0 - 1 %   Basophils Absolute 0.0 0.0 - 0.1 K/uL  Basic metabolic panel Status: Abnormal   Collection Time: 11/23/14 5:21 AM  Result Value Ref Range   Sodium 140 135 - 145 mmol/L   Potassium 3.2 (L) 3.5 - 5.1 mmol/L   Chloride 88 (L) 101 - 111 mmol/L   CO2 42 (H) 22 - 32 mmol/L   Glucose, Bld 161 (H) 65 - 99 mg/dL   BUN 70 (H) 6 - 20 mg/dL   Creatinine, Ser 0.86 (H) 0.44 - 1.00 mg/dL   Calcium 9.0 8.9 - 57.8 mg/dL   GFR calc non Af Amer 29 (L) >60 mL/min   GFR calc Af Amer 33 (L) >60 mL/min   Anion gap 10 5 - 15      Imaging Results (Last 48 hours)    Dg Chest Port 1 View  11/22/2014 CLINICAL DATA: Edema and shortness of breath; acute on chronic respiratory failure. EXAM: PORTABLE CHEST - 1 VIEW COMPARISON: Portable chest x-ray of July 9th 2016 FINDINGS:  The lungs are better inflated today. There  remain coarse interstitial opacities bilaterally. The left hemidiaphragm remains obscured. Small bilateral pleural effusions persist. The cardiac silhouette pulmonary vascularity are engorged. The mediastinum is less prominent in width today. The trachea is midline. The bony thorax exhibits no acute abnormality. IMPRESSION: There has been mild interval improvement in the appearance of the pulmonary interstitium which may indicate improving edema or pneumonia. There remain small bilateral pleural effusions. Electronically Signed By: David Swaziland M.D. On: 11/22/2014 07:44     Assessment/Plan: Diagnosis: debility due to acute on chronic respiratory failure 1. Does the need for close, 24 hr/day medical supervision in concert with the patient's rehab needs make it unreasonable for this patient to be served in a less intensive setting? Yes 2. Co-Morbidities requiring supervision/potential complications: peripheral, gout, severe PF,  3. Due to bladder management, bowel management, safety, skin/wound care, disease management, medication administration, pain management and patient education, does the patient require 24 hr/day rehab nursing? Yes 4. Does the patient require coordinated care of a physician, rehab nurse, PT (1-2 hrs/day, 5 days/week) and OT (1-2 hrs/day, 5 days/week) to address physical and functional deficits in the context of the above medical diagnosis(es)? Yes Addressing deficits in the following areas: balance, endurance, locomotion, strength, transferring, bowel/bladder control, bathing, dressing, feeding, grooming and toileting 5. Can the patient actively participate in an intensive therapy program of at least 3 hrs of therapy per day at least 5 days per week? Yes 6. The potential for patient to make measurable gains while on inpatient rehab is excellent 7. Anticipated functional outcomes upon discharge from inpatient rehab are modified independent with PT, modified independent with  OT, n/a with SLP. 8. Estimated rehab length of stay to reach the above functional goals is: 7 days 9. Does the patient have adequate social supports and living environment to accommodate these discharge functional goals? Yes 10. Anticipated D/C setting: Home 11. Anticipated post D/C treatments: HH therapy and Outpatient therapy 12. Overall Rehab/Functional Prognosis: excellent  RECOMMENDATIONS: This patient's condition is appropriate for continued rehabilitative care in the following setting: CIR Patient has agreed to participate in recommended program. Yes Note that insurance prior authorization may be required for reimbursement for recommended care.  Comment: Rehab Admissions Coordinator to follow up.  Thanks,  Ranelle Oyster, MD, [facility]     11/23/2014       Revision History     Date/Time User Provider Type Action   11/24/2014 11:11 AM Ranelle Oyster, MD Physician Sign   11/24/2014 10:47 AM Charlton Amor, PA-C Physician Assistant Pend   View Details Report       Routing History     Date/Time From To Method   11/24/2014 11:11 AM Ranelle Oyster, MD Ranelle Oyster, MD In Basket   11/24/2014 11:11 AM Ranelle Oyster, MD Amy Michelle Nasuti, MD In Basket

## 2014-11-24 NOTE — H&P (Signed)
Physical Medicine and Rehabilitation Admission H&P   Chief complaint: Weakness  HPI: [name] [name] is a 81 y.o. right handed female with history of chronic renal insufficiency with creatinine 1.19, pulmonary fibrosis with home oxygen of 5 L, diastolic congestive heart failure, chronic atrial fibrillation maintained on Coumadin therapy. Patient lives with her son and also home health aide 5 hours a day 5 days a week. She used a walker to help aid in mobility. Presented 11/16/2014 with increasing shortness of breath, fever ,cough. Chest x-ray with no acute abnormalities no evidence of pulmonary edema. Appearance consistent with chronic interstitial lung disease. Pulmonary service follow-up suspect acute viral pneumonitis placed on Solu-Medrol taper continues with diuresis. Echocardiogram with ejection fraction of 25% normal systolic function. Chronic Coumadin ongoing. Contact precautions for MRSA PCR screening positive. Physical therapy evaluation completed 11/23/2014 with recommendations of physical medicine rehabilitation consult. Patient was admitted for comprehensive rehabilitation program  Patient states that she had trouble with oxygen desaturation even prior to admission to the hospital.  ROS Review of Systems  Constitutional: Positive for fever. Negative for chills.  Eyes: Negative for blurred vision and double vision.  Respiratory: Positive for cough and shortness of breath.  Cardiovascular: Positive for palpitations and leg swelling.  Gastrointestinal: Positive for heartburn and constipation.   GERD  Musculoskeletal: Positive for myalgias and joint pain.  Skin: Negative for rash.  Neurological: Negative for dizziness and headaches.  Psychiatric/Behavioral: Positive for depression   Past Medical History  Diagnosis Date  . Congestive heart failure, unspecified     a. ECHO 6/0: EF 55-60%, mild LVH, mildly dilated RV w. mildly dec fx, RVSP 67  . Chronic  atrial fibrillation     failed cardioversion in past -repeat DC-CV on October 5th, 2010  . Personal history of DVT (deep vein thrombosis) 2008    LLE  . HTN (hypertension)   . Morbid obesity   . Shingles     with postherpetic neuraigia  . GERD (gastroesophageal reflux disease)   . Hyperlipidemia   . Meningitis ~ 1960    hospitalized @ [facility]  . Gallstones     s/p cholecystectomy  . CAD (coronary artery disease)     nonobstructive by cath 8/10 - LAD 40-50% w mild PAH mean 29 w PVR 3.2 [place]  . PE (pulmonary embolism) ~ 2010    LLE  . Shortness of breath dyspnea   . Lung interstitial disease   . Hypoxemia   . COPD (chronic obstructive pulmonary disease)   . On home oxygen therapy     "5L; 24/7" (10/11/2014)  . OSA treated with BiPAP     "I think it's BiPAP"  . AAA (abdominal aortic aneurysm)     small  . History of blood transfusion   . Anemia   . Iron deficiency anemia   . History of blood transfusion     "related to nose bleed"  . Gout   . Depression   . Chronic kidney disease (CKD), stage II (mild)    Past Surgical History  Procedure Laterality Date  . Appendectomy  1947  . Tonsillectomy  1946  . Cardioversion  01/2007    x2  . Right heart catheterization N/A 02/28/2014    Procedure: RIGHT HEART CATH; Surgeon: Larey Dresser, MD; Location: [facility] CATH LAB; Service: Cardiovascular; Laterality: N/A;  . Cardiac catheterization  02/2014  . Cataract extraction w/ intraocular lens implant, bilateral Bilateral    Family History  Problem Relation Age of Onset  .  Heart failure Mother   . Depression Mother   . Ovarian cancer Paternal Grandmother   . Heart disease Paternal Grandfather   . Obesity Other    Social History:  reports that she has never smoked. She has never used smokeless tobacco. She  reports that she drinks alcohol. She reports that she does not use illicit drugs. Allergies:  Allergies  Allergen Reactions  . Pirfenidone Other (See Comments)    Congestion, breathing diffulty   Medications Prior to Admission  Medication Sig Dispense Refill  . albuterol (PROVENTIL) (2.5 MG/3ML) 0.083% nebulizer solution Take 3 mLs (2.5 mg total) by nebulization every 2 (two) hours as needed for wheezing or shortness of breath. 75 mL 12  . allopurinol (ZYLOPRIM) 300 MG tablet Take 1 tablet (300 mg total) by mouth daily. 30 tablet 11  . aspirin 81 MG tablet Take 81 mg by mouth daily.    [name] atorvastatin (LIPITOR) 10 MG tablet Take 1 tablet (10 mg total) by mouth daily. 30 tablet 5  . budesonide (PULMICORT) 0.25 MG/2ML nebulizer solution Take 0.25 mg by nebulization 2 (two) times daily.    . cetirizine (ZYRTEC ALLERGY) 10 MG tablet Take 10 mg by mouth daily.     . colchicine 0.6 MG tablet Take 0.6 mg by mouth as needed (for flareup).     [name] diltiazem (CARDIZEM CD) 240 MG 24 hr capsule Take 1 capsule (240 mg total) by mouth daily. 30 capsule 0  . escitalopram (LEXAPRO) 20 MG tablet Take 1 tablet (20 mg total) by mouth daily. 30 tablet 5  . famotidine (PEPCID) 20 MG tablet Take 1 tablet (20 mg total) by mouth daily. 30 tablet 6  . ferrous sulfate 325 (65 FE) MG tablet Take 1 tablet (325 mg total) by mouth 2 (two) times daily with a meal. 60 tablet 0  . furosemide (LASIX) 80 MG tablet Take 1 tablet (80 mg total) by mouth every 8 (eight) hours. 90 tablet 1  . gabapentin (NEURONTIN) 300 MG capsule Take 1 capsule (300 mg total) by mouth daily. 90 capsule 0  . Multiple Vitamins-Minerals (MULTIVITAMIN & MINERAL PO) Take 1 tablet by mouth daily.    [name] nystatin (MYCOSTATIN/NYSTOP) 100000 UNIT/GM POWD Apply to flank and groin and under breasts BID (Patient taking differently: Apply topically 2 (two) times daily as needed  (fungal areas). Apply to flank and groin and under breasts BID) 60 g 2  . Polyethyl Glycol-Propyl Glycol (SYSTANE OP) Place 1 drop into both eyes daily as needed (dry eyes).    . potassium chloride SA (K-DUR,KLOR-CON) 20 MEQ tablet Take 20 mEq by mouth daily.    . predniSONE (DELTASONE) 5 MG tablet Take 15 mg daily for one month Take 10 mg daily for one month Take 5 mg daily for one month (Patient taking differently: Take 15 mg by mouth daily with breakfast. Take 15 mg daily for one month Take 10 mg daily for one month Take 5 mg daily for one month) 180 tablet 0  . warfarin (COUMADIN) 2.5 MG tablet Take as directed. (Patient taking differently: Take 3.75 mg by mouth daily at 6 PM. Take as directed.) 90 tablet 0  . metolazone (ZAROXOLYN) 5 MG tablet Take 1 tablet (5 mg total) by mouth daily as needed. (Patient taking differently: Take 5 mg by mouth daily as needed (for fluid retention when weight reaches 295). ) 30 tablet 1  . OVER THE COUNTER MEDICATION Inhale 1 application into the lungs at bedtime. USES BIPAP EVERY BEDTIME  FOR SLEEP      Home: Home Living Family/patient expects to be discharged to:: Private residence Living Arrangements: Children, Non-relatives/Friends Available Help at Discharge: Family, Personal care attendant, Available 24 hours/day Type of Home: House Home Access: Stairs to enter [employer] of Steps: 2 Entrance Stairs-Rails: Left, Right, Can reach both Home Layout: One level Bathroom Shower/Tub: Multimedia programmer: Handicapped height Bathroom Accessibility: Yes Home Equipment: Civil engineer, contracting - built in, [employer] - tub/shower, Environmental consultant - 2 wheels, Transport chair, Bedside commode, Other (comment), Adaptive equipment Adaptive Equipment: Reacher, Sock aid, Long-handled shoe horn, Long-handled sponge Additional Comments: son and daughter in-law live with pt; has aide 5 hours daily x 5 days a week  Functional  History: Prior Function Level of Independence: Needs assistance Gait / Transfers Assistance Needed: Uses RW and has lift chair and elevated height bed.  ADL's / Homemaking Assistance Needed: (A) with cooking/cleaning; reports she can dress and bathe herself  Comments: pt reports incr difficulty ambulating to/from bathroom at home; very anxious and fearful of falling  Functional Status:  Mobility: Bed Mobility General bed mobility comments: up in chair Transfers Overall transfer level: Needs assistance Equipment used: Rolling walker (2 wheeled) Transfers: Sit to/from Stand Sit to Stand: Min guard General transfer comment: unsteady; min guard to balance; incr time due to weakness Ambulation/Gait Ambulation/Gait assistance: Min guard Ambulation Distance (Feet): 20 Feet Assistive device: Rolling walker (2 wheeled) Gait Pattern/deviations: Step-through pattern, Decreased stride length, Wide base of support, Shuffle General Gait Details: unsteady; intermittent c/o dizziness; min guard to (A) with RW management; O2 desat to 56% on 6L during ambulation; recoverd with 5 min recovery time in sitting to 90% Gait velocity: very decreased Gait velocity interpretation: Below normal speed for age/gender    ADL:    Cognition: Cognition Overall Cognitive Status: Within Functional Limits for tasks assessed Cognition Arousal/Alertness: Awake/alert Behavior During Therapy: Anxious Overall Cognitive Status: Within Functional Limits for tasks assessed  Physical Exam: Blood pressure 130/57, pulse 66, temperature 97.5 F (36.4 C), temperature source Axillary, resp. rate 19, height _0  (1.626 m), weight 131.2 kg (289 lb 3.9 oz), SpO2 92 %. Physical Exam Constitutional: She is oriented to person, place, and time.  81 year old right-handed obese female  HENT:  Head: Normocephalic.  Eyes: EOM are normal.  Neck: Normal range of motion. Neck supple. No thyromegaly present.   Cardiovascular:  Cardiac rate controlled  Respiratory:  Decreased breath sounds at the bases  GI: Soft. Bowel sounds are normal. She exhibits no distension.  Neurological: She is alert and oriented to person, place, and time. No cranial nerve deficit. Coordination normal.  Follows commands. UES 5/5 prox to distal. LES: 2/5 HF, 3/5 KE and 4/5 ankles. Decreased LT bilateral distal legs/feet.  Skin: Skin is warm and dry.  Multiple scars, wounds on both legs. Numerous bruises and ecchymoses on the UE's.  Psychiatric: She has a normal mood and affect. Her behavior is normal.  Stasis dermatitis bilateral lower extremities, healed extensive abrasions bilateral anterior leg right greater than the left    Lab Results Last 48 Hours    Results for orders placed or performed during the hospital encounter of 11/16/14 (from the past 48 hour(s))  Basic metabolic panel Status: Abnormal   Collection Time: 11/22/14 6:55 PM  Result Value Ref Range   Sodium 136 135 - 145 mmol/L   Potassium 5.7 (H) 3.5 - 5.1 mmol/L    Comment: DELTA CHECK NOTED HEMOLYSIS AT THIS LEVEL MAY AFFECT RESULT  Chloride 85 (L) 101 - 111 mmol/L   CO2 39 (H) 22 - 32 mmol/L   Glucose, Bld 273 (H) 65 - 99 mg/dL   BUN 70 (H) 6 - 20 mg/dL   Creatinine, Ser 1.83 (H) 0.44 - 1.00 mg/dL   Calcium 9.0 8.9 - 10.3 mg/dL   GFR calc non Af Amer 25 (L) >60 mL/min   GFR calc Af Amer 29 (L) >60 mL/min    Comment: (NOTE) The eGFR has been calculated using the CKD EPI equation. This calculation has not been validated in all clinical situations. eGFR's persistently <60 mL/min signify possible Chronic Kidney Disease.    Anion gap 12 5 - 15  Basic metabolic panel Status: Abnormal   Collection Time: 11/22/14 10:05 PM  Result Value Ref Range   Sodium 139 135 - 145 mmol/L   Potassium 3.6 3.5 - 5.1 mmol/L    Comment: DELTA CHECK NOTED   Chloride  86 (L) 101 - 111 mmol/L   CO2 41 (H) 22 - 32 mmol/L   Glucose, Bld 209 (H) 65 - 99 mg/dL   BUN 75 (H) 6 - 20 mg/dL   Creatinine, Ser 1.96 (H) 0.44 - 1.00 mg/dL   Calcium 9.4 8.9 - 10.3 mg/dL   GFR calc non Af Amer 23 (L) >60 mL/min   GFR calc Af Amer 26 (L) >60 mL/min    Comment: (NOTE) The eGFR has been calculated using the CKD EPI equation. This calculation has not been validated in all clinical situations. eGFR's persistently <60 mL/min signify possible Chronic Kidney Disease.    Anion gap 12 5 - 15  Protime-INR Status: Abnormal   Collection Time: 11/23/14 5:21 AM  Result Value Ref Range   Prothrombin Time 25.1 (H) 11.6 - 15.2 seconds   INR 2.30 (H) 0.00 - 1.49  Magnesium Status: Abnormal   Collection Time: 11/23/14 5:21 AM  Result Value Ref Range   Magnesium 2.6 (H) 1.7 - 2.4 mg/dL  Phosphorus Status: None   Collection Time: 11/23/14 5:21 AM  Result Value Ref Range   Phosphorus 4.5 2.5 - 4.6 mg/dL  CBC with Differential/Platelet Status: Abnormal   Collection Time: 11/23/14 5:21 AM  Result Value Ref Range   WBC 12.9 (H) 4.0 - 10.5 K/uL   RBC 4.48 3.87 - 5.11 MIL/uL   Hemoglobin 14.5 12.0 - 15.0 g/dL   HCT 44.9 36.0 - 46.0 %   MCV 100.2 (H) 78.0 - 100.0 fL   MCH 32.4 26.0 - 34.0 pg   MCHC 32.3 30.0 - 36.0 g/dL   RDW 16.5 (H) 11.5 - 15.5 %   Platelets 226 150 - 400 K/uL   Neutrophils Relative % 94 (H) 43 - 77 %   Neutro Abs 12.1 (H) 1.7 - 7.7 K/uL   Lymphocytes Relative 2 (L) 12 - 46 %   Lymphs Abs 0.3 (L) 0.7 - 4.0 K/uL   Monocytes Relative 4 3 - 12 %   Monocytes Absolute 0.5 0.1 - 1.0 K/uL   Eosinophils Relative 0 0 - 5 %   Eosinophils Absolute 0.0 0.0 - 0.7 K/uL   Basophils Relative 0 0 - 1 %   Basophils Absolute 0.0 0.0 - 0.1 K/uL  Basic metabolic panel Status: Abnormal   Collection  Time: 11/23/14 5:21 AM  Result Value Ref Range   Sodium 140 135 - 145 mmol/L   Potassium 3.2 (L) 3.5 - 5.1 mmol/L   Chloride 88 (L) 101 - 111 mmol/L   CO2 42 (H) 22 -  32 mmol/L   Glucose, Bld 161 (H) 65 - 99 mg/dL   BUN 70 (H) 6 - 20 mg/dL   Creatinine, Ser 1.62 (H) 0.44 - 1.00 mg/dL   Calcium 9.0 8.9 - 10.3 mg/dL   GFR calc non Af Amer 29 (L) >60 mL/min   GFR calc Af Amer 33 (L) >60 mL/min    Comment: (NOTE) The eGFR has been calculated using the CKD EPI equation. This calculation has not been validated in all clinical situations. eGFR's persistently <60 mL/min signify possible Chronic Kidney Disease.    Anion gap 10 5 - 15  Protime-INR Status: Abnormal   Collection Time: 11/24/14 5:17 AM  Result Value Ref Range   Prothrombin Time 22.3 (H) 11.6 - 15.2 seconds   INR 1.97 (H) 0.00 - 1.49  CBC with Differential/Platelet Status: Abnormal   Collection Time: 11/24/14 5:17 AM  Result Value Ref Range   WBC 15.0 (H) 4.0 - 10.5 K/uL   RBC 4.62 3.87 - 5.11 MIL/uL   Hemoglobin 15.0 12.0 - 15.0 g/dL   HCT 46.2 (H) 36.0 - 46.0 %   MCV 100.0 78.0 - 100.0 fL   MCH 32.5 26.0 - 34.0 pg   MCHC 32.5 30.0 - 36.0 g/dL   RDW 16.5 (H) 11.5 - 15.5 %   Platelets 258 150 - 400 K/uL   Neutrophils Relative % 94 (H) 43 - 77 %   Neutro Abs 14.2 (H) 1.7 - 7.7 K/uL   Lymphocytes Relative 4 (L) 12 - 46 %   Lymphs Abs 0.5 (L) 0.7 - 4.0 K/uL   Monocytes Relative 2 (L) 3 - 12 %   Monocytes Absolute 0.3 0.1 - 1.0 K/uL   Eosinophils Relative 0 0 - 5 %   Eosinophils Absolute 0.0 0.0 - 0.7 K/uL   Basophils Relative 0 0 - 1 %   Basophils Absolute 0.0 0.0 - 0.1 K/uL  Basic metabolic panel Status: Abnormal   Collection Time: 11/24/14 5:17 AM  Result Value Ref Range   Sodium 142 135 - 145 mmol/L   Potassium 3.6 3.5 - 5.1 mmol/L     Comment: SLIGHT HEMOLYSIS   Chloride 87 (L) 101 - 111 mmol/L   CO2 44 (H) 22 - 32 mmol/L   Glucose, Bld 166 (H) 65 - 99 mg/dL   BUN 73 (H) 6 - 20 mg/dL   Creatinine, Ser 1.61 (H) 0.44 - 1.00 mg/dL   Calcium 9.4 8.9 - 10.3 mg/dL   GFR calc non Af Amer 29 (L) >60 mL/min   GFR calc Af Amer 33 (L) >60 mL/min    Comment: (NOTE) The eGFR has been calculated using the CKD EPI equation. This calculation has not been validated in all clinical situations. eGFR's persistently <60 mL/min signify possible Chronic Kidney Disease.    Anion gap 11 5 - 15      Imaging Results (Last 48 hours)    Dg Chest Portable 1 View  11/24/2014 CLINICAL DATA: Respiratory failure EXAM: PORTABLE CHEST - 1 VIEW COMPARISON: Portable chest x-ray of November 22, 2014 FINDINGS: The lungs are reasonably well inflated. The interstitial markings are coarse and remain increased bilaterally. The left lower lobe is dense and there is obscuration of the left hemidiaphragm consistent with pleural fluid. The cardiac silhouette is enlarged. The pulmonary vascularity is prominent centrally. There is pleural fluid versus pleural thickening over the superior lateral aspects of both lungs. The bony thorax exhibits no acute abnormality. IMPRESSION: There has not been significant change since yesterday's  study. CHF with small bilateral pleural effusions and interstitial edema and/or bilateral interstitial pneumonia persists. Electronically Signed By: David Martinique M.D. On: 11/24/2014 07:42        Medical Problem List and Plan: 1. Functional deficits secondary to debilitation related to acute on chronic respiratory failure with pulmonary fibrosis with chronic oxygen therapy/Solu-Medrol per pulmonary services intravenous for now (patient was on a prednisone taper to off over 3 months prior to admission) 2. DVT Prophylaxis/Anticoagulation: History of DVT,Chronic Coumadin therapy. Monitor for  rebleeding episodes 3. Pain Management: Neurontin 300 mg daily. 4. Mood/depression: Lexapro 20 mg daily. Provide emotional support 5. Neuropsych: This patient is capable of making decisions on her own behalf. 6. Skin/Wound Care: Routine skin checks 7. Fluids/Electrolytes/Nutrition: Routine I&O with follow-up chemistries 8. Hypertension/atrial fibrillation. Cardizem 240 mg daily, Zaroxolyn 5 mg daily 9. Diastolic congestive heart failure. Continue Lasix as directed 40 mg 3 times a day and monitor for any signs of fluid overload as well as monitoring renal function 10. MRSA PCR screening positive. Contact precautions 11. Hyperlipidemia. Lipitor 12. Chronic renal insufficiency. Baseline creatinine 1.19-1.61. Follow-up chemistries   Post Admission Physician Evaluation: 1. Functional deficits secondary to debilitation related to acute on chronic respiratory failure with pulmonary fibrosis with chronic oxygen therapy. 2. Patient is admitted to receive collaborative, interdisciplinary care between the physiatrist, rehab nursing staff, and therapy team. 3. Patient's level of medical complexity and substantial therapy needs in context of that medical necessity cannot be provided at a lesser intensity of care such as a SNF. 4. Patient has experienced substantial functional loss from his/her baseline which was documented above under the "Functional History" and "Functional Status" headings. Judging by the patient's diagnosis, physical exam, and functional history, the patient has potential for functional progress which will result in measurable gains while on inpatient rehab. These gains will be of substantial and practical use upon discharge in facilitating mobility and self-care at the household level. 5. Physiatrist will provide 24 hour management of medical needs as well as oversight of the therapy plan/treatment and provide guidance as appropriate regarding the interaction of the two. 6. 24 hour  rehab nursing will assist with bladder management, bowel management, safety, skin/wound care, disease management, medication administration, pain management and patient education and help integrate therapy concepts, techniques,education, etc. 7. PT will assess and treat for/with: pre gait, gait training, endurance , safety, equipment, neuromuscular re education 8. . Goals are: Mod I. 9. OT will assess and treat for/with: ADLs, Cognitive perceptual skills, Neuromuscular re education, safety, endurance, equipment. Goals are: Mod I. Therapy may proceed with showering this patient. 10. SLP will assess and treat for/with: NA. Goals are: NA. 11. Case Management and Social Worker will assess and treat for psychological issues and discharge planning. 12. Team conference will be held weekly to assess progress toward goals and to determine barriers to discharge. 13. Patient will receive at least 3 hours of therapy per day at least 5 days per week. 14. ELOS: 5-7d  15. Prognosis: good     Charlett Blake M.D. [facility] FAAPM&R (Sports Med, Neuromuscular Med) Diplomate Am Board of Electrodiagnostic Med  11/24/2014

## 2014-11-24 NOTE — Progress Notes (Signed)
I met with pt at bedside to discuss a possible inpt rehab admission. Pt is in agreement to admission and I have a bed available today. I will contact Attending MD for possible admission today. [phone]

## 2014-11-24 NOTE — Progress Notes (Signed)
ANTICOAGULATION CONSULT NOTE - Follow Up Consult  Pharmacy Consult for Coumadin  Indication: hx dvt/pe, afib  Labs:  Recent Labs  11/22/14 0431  11/22/14 2205 11/23/14 0521 11/24/14 0517  HGB 14.4  --   --  14.5 15.0  HCT 45.3  --   --  44.9 46.2*  PLT 239  --   --  226 258  LABPROT 28.4*  --   --  25.1* 22.3*  INR 2.72*  --   --  2.30* 1.97*  CREATININE 1.43*  < > 1.96* 1.62* 1.61*  < > = values in this interval not displayed.  Estimated Creatinine Clearance: 36.9 mL/min (by C-G formula based on Cr of 1.61).   Assessment:  Continues on Coumadin for hx afib, DVT and PE.  INR is down to 1.97, at low outpatient goal of 1.8-2.2.  Had had variable doses since admitted on 7/6 with supratherapeutic INR of 3.28.    Home Coumadin dose:  3.75 mg daily (1.5 tabs of 2.5 mg)   Goal of Therapy:  Per outpatient records, INR goal 1.8-2.2 Monitor platelets by anticoagulation protocol: Yes   Plan:   Coumadin 3 mg x 1 today.  Continue daily PT/INR.  Expecting transfer to Inpatient Rehab today.  Dennie Fettersgan, Shi Blankenship Donovan, [place] Pager: [phone] 11/24/2014 3:04 PM

## 2014-11-24 NOTE — Progress Notes (Addendum)
Received pt. As a transfer from 6 E.Pt. Was oriented to rehab unit.Safety plan was explained,fall prevention plan was explained and signed.Keep monitoring pt..closely.Safety video was showed to pt.

## 2014-11-24 NOTE — PMR Pre-admission (Signed)
PMR Admission Coordinator Pre-Admission Assessment  Patient: [name] [name] is an 81 y.o., female MRN: [id number] DOB: [birth year] Height: 5\' 4"  (162.6 cm) Weight: 131.2 kg (289 lb 3.9 oz)              Insurance Information HMO:     PPO:      PCP:      IPA:      80/20: yes     OTHER: no HMO PRIMARY: Medicare a and b      Policy#: [id number]      Subscriber: pt Benefits:  Phone #: palmetto online     Name: 11/24/14 Eff. Date: 09/11/1998     Deduct: $1288      Out of Pocket Max: none      Life Max: none CIR: 100%      SNF: 20 full days Outpatient: 80%     Co-Pay: 20% Home Health: 100%      Co-Pay: none DME: 80%     Co-Pay: 20% Providers: pt choice  SECONDARY: none       Medicaid Application Date:       Case Manager:  Disability Application Date:       Case Worker:   Emergency Contact Information Contact Information    Name Relation Home Work Mobile   Merica,Philip Son   [phone]   Sussie, Minor  [phone]   Shauntavia, Brackin   [phone]   Gaulin,Amy Relative   [phone]     Current Medical History  Patient Admitting Diagnosis: debility due to acute on chronic respiratory failure  History of Present Illness: [name] [name] is a 81 y.o. right handed female with history of chronic renal insufficiency with creatinine 1.19, pulmonary fibrosis with home oxygen of 5 L, diastolic congestive heart failure, chronic atrial fibrillation maintained on Coumadin therapy. Patient lives with her son and also home health aide 5 hours a day 5 days a week. She used a walker to help aid in mobility.   Presented 11/16/2014 with increasing shortness of breath, fever ,cough. Chest x-ray with no acute abnormalities no evidence of pulmonary edema. Appearance consistent with chronic interstitial lung disease. Pulmonary service follow-up suspect acute viral pneumonitis placed on Solu-Medrol taper continues with diuresis. Echocardiogram with ejection fraction of 60% normal systolic  function. Chronic Coumadin ongoing.   Past Medical History  Past Medical History  Diagnosis Date  . Congestive heart failure, unspecified     a. ECHO 6/0: EF 55-60%, mild LVH, mildly dilated RV w. mildly dec fx, RVSP 67  . Chronic atrial fibrillation     failed cardioversion in past  -repeat DC-CV on October 5th, 2010  . Personal history of DVT (deep vein thrombosis) 2008    LLE  . HTN (hypertension)   . Morbid obesity   . Shingles     with postherpetic neuraigia  . GERD (gastroesophageal reflux disease)   . Hyperlipidemia   . Meningitis ~ 1960    hospitalized @ [facility]  . Gallstones     s/p cholecystectomy  . CAD (coronary artery disease)     nonobstructive by cath 8/10 - LAD 40-50% w mild PAH mean 29 w PVR 3.2 [place]  . PE (pulmonary embolism) ~ 2010    LLE  . Shortness of breath dyspnea   . Lung interstitial disease   . Hypoxemia   . COPD (chronic obstructive pulmonary disease)   . On home oxygen therapy     "5L; 24/7" (10/11/2014)  . OSA treated  with BiPAP     "I think it's BiPAP"  . AAA (abdominal aortic aneurysm)     small  . History of blood transfusion   . Anemia   . Iron deficiency anemia   . History of blood transfusion     "related to nose bleed"  . Gout   . Depression   . Chronic kidney disease (CKD), stage II (mild)     Family History  family history includes Depression in her mother; Heart disease in her paternal grandfather; Heart failure in her mother; Obesity in her other; Ovarian cancer in her paternal grandmother.  Prior Rehab/Hospitalizations:  Has the patient had major surgery during 100 days prior to admission? No Pt at [facility] 4 yrs ago after a RLE injury from a fall. Wound complex and was admitted to [facility] hospital Burn unit for wound care and then [facility] for rehab recovery.  Current Medications   Current facility-administered medications:  .  0.9 %  sodium chloride infusion, 250 mL, Intravenous, PRN, Lupita Leash, MD .  0.9 %   sodium chloride infusion, 250 mL, Intravenous, PRN, Lupita Leash, MD, Last Rate: 10 mL/hr at 11/17/14 1411, 250 mL at 11/17/14 1411 .  acetaminophen (TYLENOL) tablet 650 mg, 650 mg, Oral, Q4H PRN, Lupita Leash, MD, 650 mg at 11/20/14 2242 .  albuterol (PROVENTIL) (2.5 MG/3ML) 0.083% nebulizer solution 2.5 mg, 2.5 mg, Nebulization, Q2H PRN, Lupita Leash, MD, 2.5 mg at 11/20/14 2339 .  albuterol (PROVENTIL) (2.5 MG/3ML) 0.083% nebulizer solution 2.5 mg, 2.5 mg, Nebulization, Q6H, Lupita Leash, MD, 2.5 mg at 11/24/14 1429 .  allopurinol (ZYLOPRIM) tablet 300 mg, 300 mg, Oral, Daily, Lupita Leash, MD, 300 mg at 11/24/14 1050 .  aspirin EC tablet 81 mg, 81 mg, Oral, Daily, Lupita Leash, MD, 81 mg at 11/24/14 1050 .  atorvastatin (LIPITOR) tablet 10 mg, 10 mg, Oral, Daily, Lupita Leash, MD, 10 mg at 11/24/14 1049 .  budesonide (PULMICORT) nebulizer solution 0.25 mg, 0.25 mg, Nebulization, BID, Lupita Leash, MD, 0.25 mg at 11/24/14 0917 .  chlorhexidine (PERIDEX) 0.12 % solution 15 mL, 15 mL, Mouth Rinse, BID, Lupita Leash, MD, 15 mL at 11/24/14 1050 .  Chlorhexidine Gluconate Cloth 2 % PADS 6 each, 6 each, Topical, Q0600, Scarlett Presto, RPH, 6 each at 11/24/14 0600 .  diltiazem (CARDIZEM CD) 24 hr capsule 240 mg, 240 mg, Oral, Daily, Lupita Leash, MD, 240 mg at 11/24/14 1051 .  escitalopram (LEXAPRO) tablet 20 mg, 20 mg, Oral, Daily, Lupita Leash, MD, 20 mg at 11/24/14 1051 .  famotidine (PEPCID) tablet 20 mg, 20 mg, Oral, Daily, Lupita Leash, MD, 20 mg at 11/24/14 1049 .  ferrous sulfate tablet 325 mg, 325 mg, Oral, BID WC, Lupita Leash, MD, 325 mg at 11/24/14 0857 .  furosemide (LASIX) injection 60 mg, 60 mg, Intravenous, 3 times per day, Nelda Bucks, MD, 60 mg at 11/24/14 1442 .  gabapentin (NEURONTIN) capsule 300 mg, 300 mg, Oral, Daily, Lupita Leash, MD, 300 mg at 11/24/14 1049 .  loratadine (CLARITIN) tablet 10 mg, 10 mg,  Oral, Daily, Lupita Leash, MD, 10 mg at 11/24/14 1049 .  methylPREDNISolone sodium succinate (SOLU-MEDROL) 40 mg/mL injection 40 mg, 40 mg, Intravenous, Q12H, Bernadene Person, NP, 40 mg at 11/24/14 1301 .  metolazone (ZAROXOLYN) tablet 5 mg, 5 mg, Oral, Daily, Lupita Leash, MD, 5 mg at 11/24/14 1049 .  mupirocin ointment (BACTROBAN) 2 % 1 application, 1 application, Nasal, BID, Md Pccm, MD, 1 application at 11/24/14 1054 .  ondansetron (ZOFRAN) injection 4 mg, 4 mg, Intravenous, Q6H PRN, Lupita Leash, MD .  potassium chloride SA (K-DUR,KLOR-CON) CR tablet 40 mEq, 40 mEq, Oral, TID, Duayne Cal, NP, 40 mEq at 11/24/14 1049 .  sodium chloride 0.9 % injection 3 mL, 3 mL, Intravenous, Q12H, Lupita Leash, MD, 3 mL at 11/24/14 1442 .  sodium chloride 0.9 % injection 3 mL, 3 mL, Intravenous, PRN, Lupita Leash, MD, 3 mL at 11/23/14 1451 .  warfarin (COUMADIN) tablet 3 mg, 3 mg, Oral, ONCE-1800, Scarlett Presto, RPH .  Warfarin - Pharmacist Dosing Inpatient, , Does not apply, q1800, Earnie Larsson, [facility]  Patients Current Diet: Diet 2 gram sodium Room service appropriate?: Yes; Fluid consistency:: Thin  Precautions / Restrictions Precautions Precautions: Fall Precaution Comments: monitor O2 sats Restrictions Weight Bearing Restrictions: No   Has the patient had 2 or more falls or a fall with injury in the past year?No. Uses RW at home and has been greater than a year since last fall. Previous falls related to rollator RW getting away from her or non use of RW. Pt now uses RW all the time with 2 wheels only.  Prior Activity Level Household: only in community for MD appointments  Home Assistive Devices / Equipment Home Assistive Devices/Equipment: BIPAP, Eyeglasses, Grab bars around toilet, Grab bars in shower, Hand-held shower hose, Oxygen, Shower chair without back, Environmental consultant (specify type) Home Equipment: Shower seat - built in, [employer] - tub/shower, Environmental consultant - 2 wheels,  Transport chair, Bedside commode, Other (comment), Adaptive equipment  Prior Device Use: Indicate devices/aids used by the patient prior to current illness, exacerbation or injury? Walker Pt in community only for MD appointments. Ambulates only for less than 50 feet to go to bathroom. Has a chair halfway point for sitting for energy conservation due to pulmonary fibrosis. Does get into shower and bathes upper body herself. Cortney then assists with lower body bathing and dressing. Cortney does the cooking and light housekeeping and shopping. Lift chair use to stand. Dtr in law , AMy, assists on the weekend. Son, Thayer Ohm, 48 yo in the home with MS is Independent and self sufficient. He can provide supervision. Son Harvie Heck and Loistine Chance are local and very supportive.  Prior Functional Level Prior Function Level of Independence: Needs assistance Gait / Transfers Assistance Needed: Uses RW and has lift chair and elevated height bed.   ADL's / Homemaking Assistance Needed: Has Cortney hired 5 hrs per day 5 days per week for cooking cleaning, and assist with LB bathing and dressing Comments: pt reports incr difficulty ambulating to/from bathroom at home; very anxious and fearful of falling  Self Care: Did the patient need help bathing, dressing, using the toilet or eating?  Needed some help  Indoor Mobility: Did the patient need assistance with walking from room to room (with or without device)? Needed some help  Stairs: Did the patient need assistance with internal or external stairs (with or without device)? Needed some help able to reach both rails to entry of home. Turns sideways and able to maintain balance to do the 2 steps. Son, Harvie Heck, provide all transportation to all MD appointments.  Functional Cognition: Did the patient need help planning regular tasks such as shopping or remembering to take medications? Needed some help Cortney cooks, cleans, shops, etc  Current Functional Level Cognition  Overall Cognitive Status: Within Functional Limits for tasks assessed    Extremity Assessment (includes Sensation/Coordination)  Upper Extremity Assessment: Defer to OT evaluation  Lower Extremity Assessment: Generalized weakness    ADLs       Mobility  General bed mobility comments: up in chair    Transfers  Overall transfer level: Needs assistance Equipment used: Rolling walker (2 wheeled) Transfers: Sit to/from Stand Sit to Stand: Min guard General transfer comment: unsteady; min guard to balance; incr time due to weakness    Ambulation / Gait / Stairs / Wheelchair Mobility  Ambulation/Gait Ambulation/Gait assistance: Hydrographic surveyorMin guard Ambulation Distance (Feet): 20 Feet Assistive device: Rolling walker (2 wheeled) Gait Pattern/deviations: Step-through pattern, Decreased stride length, Wide base of support, Shuffle General Gait Details: unsteady; intermittent c/o dizziness; min guard to (A) with RW management; O2 desat to 56% on 6L during ambulation; recoverd with 5 min recovery time in sitting to 90% Gait velocity: very decreased Gait velocity interpretation: Below normal speed for age/gender    Posture / Balance Balance Overall balance assessment: Needs assistance, History of Falls Sitting-balance support: Feet supported, No upper extremity supported Sitting balance-Leahy Scale: Good Standing balance support: During functional activity, Bilateral upper extremity supported Standing balance-Leahy Scale: Poor Standing balance comment: RW for B UE support at all times to balance    Special needs/care consideration BiPAP/CPAP BIPAP pta Oxygen O2 at 5 liters [place] at baseline Bowel mgmt: LBM 7/14 continent Bladder mgmt: continent but urgency with lasix. Usually does not wear pants or underwear due to urgency. Wears gowns. Wears panties and pads when going to MD appointments. Contact Precautions/MRSA   Previous Home Environment Living Arrangements: Children, Other (Comment) (pt's son,  Thayer OhmChris, lives with pt. Has MS. Independent)  Lives With: Son Available Help at Discharge: Family, Available 24 hours/day Type of Home: House Home Layout: One level Home Access: Stairs to enter Entrance Stairs-Rails: Left, Right, Can reach both Entrance Stairs-Number of Steps: 2 Bathroom Shower/Tub: Walk-in shower, Other (comment) (has seat and grab bars with hand shower) Bathroom Toilet: Handicapped height Bathroom Accessibility: Yes Home Care Services: Other (Comment) (Has RN 2 Xs per week for VS, labs, etc) Type of Home Care Services: Other (Comment) (aide, Cortney paid privately ) Additional Comments: son, Thayer OhmChris, lives with pt. two other sons local very supportive  Discharge Living Setting Plans for Discharge Living Setting: Patient's home, Lives with (comment), Other (Comment) (son, Thayer OhmChris, lives with pt) Type of Home at Discharge: House Discharge Home Layout: One level Discharge Home Access: Stairs to enter Entrance Stairs-Rails: Right, Left, Can reach both Entrance Stairs-Number of Steps: 2 Discharge Bathroom Shower/Tub: Walk-in shower, Other (comment) (with seat, grab bars, and shower head) Discharge Bathroom Toilet: Handicapped height Discharge Bathroom Accessibility: Yes How Accessible: Accessible via walker Does the patient have any problems obtaining your medications?: No  Social/Family/Support Systems Patient Roles: Spouse, Parent Contact Information: Loistine Chancehilip and his wife, Amy, primary. Son Harvie HeckRandy also Anticipated Caregiver: Cortney, private aide 5 hrs per day mon-fri. Son Thayer OhmChris can do supervision 24/7, Amy, dtr in law assists on the weekends and prn Anticipated Caregiver's Contact Information: see above Ability/Limitations of Caregiver: Thayer OhmChris, 79 yo son lives with pt has MS and is Independendt. Amy helps weekends Caregiver Availability: 24/7 Discharge Plan Discussed with Primary Caregiver: Yes Is Caregiver In Agreement with Plan?: Yes Does Caregiver/Family have Issues  with Lodging/Transportation while Pt is in Rehab?: No Cortney, aide 5 hrs per day 5 days per week assistance. Thayer OhmChris there 24/7 can provide supervision level. Loistine ChancePhilip  and his wife, Amy, assists prn and on the weekends. Very supportive local sons.  Goals/Additional Needs Patient/Family Goal for Rehab: Mod I with PT and OT Expected length of stay: ELOS 7 days Equipment Needs: MRSA Contact Isolation Pt/Family Agrees to Admission and willing to participate: Yes Program Orientation Provided & Reviewed with Pt/Caregiver Including Roles  & Responsibilities: Yes  Decrease burden of Care through IP rehab admission: n/a  Possible need for SNF placement upon discharge:not anticipated. Pt would refuse  Patient Condition: This patient's condition remains as documented in the consult dated 11/24/2014, in which the Rehabilitation Physician determined and documented that the patient's condition is appropriate for intensive rehabilitative care in an inpatient rehabilitation facility. Will admit to inpatient rehab today.  Preadmission Screen Completed By:  Clois Dupes, 11/24/2014 3:08 PM ______________________________________________________________________   Discussed status with Dr. Wynn Banker on 11/24/2014 at  1508 and received telephone approval for admission today.  Admission Coordinator:  Clois Dupes, time [id number] Date 11/24/2014.

## 2014-11-24 NOTE — Discharge Summary (Signed)
Physician Discharge Summary       Patient ID: [name] [name] MRN: [id number] DOB/AGE: 79-Mar-1933 79 y.o.  Admit date: 11/16/2014 Discharge date: 11/24/2014  Discharge Diagnoses:  Active Problems:   Acute-on-chronic respiratory failure   Acute respiratory failure with hypoxia  History of Present Illness: 81 year old female with a past medical history significant for pulmonary fibrosis believed to be usual interstitial pneumonitis as well as diastolic heart failure was admitted from the pulmonary office on 11/16/2014 with a fever, cough, and worsening hypoxemic respiratory failure. She said that a week prior to admission she started having a nosebleed which only lasted for one day. This was attributed to using oxygen without a humidifier. After that she felt okay, at baseline for her. However, on Saturday she took her first dose of the pirfenidone 7/2. Within 24 hours she started coughing, feeling weak, and producing black mucus. This eventually turned into a pink frothy mucus. She had a 7/6 this morning to 101 and her family called EMS because she was gray in color. EMS came out and performed an EKG and vital signs. Her leg swelling has been at baseline recently. She presented to the pulmonary office and was admitted from there.  Hospital Course:  She was admitted to [facility] ED 7/6 with acute on chronic respiratory failure (on 5L O2 at baseline), for what was thought to be an acute flare of end stage ILD, pneumonitis (potentially related to pirfenidone), or less likely PNA. Although she has a known diagnosis of CHF, at that time it was not felt that this was a largely contributing factor. She was started on broad spectrum antibiotics, IV steroids, and diuresis. She was also given qHS BiPAP as she is on at home.  Antibiotics were quickly d/c'd as symptoms quickly resolved. Differential shifted more towards acute inflammatory pneumonitis. 7/9 she was confirmed to have Parainfluenza-3 viral infection,  which is the presumed cause of her acute pneumonitis. She was still requiring high flow O2 and PRN BiPAP in addition to her scheduled QHS BiPAP. Interdisciplinary goals of care meeting was held on 7/9 as well. It was determined that she will stay a full code, and ideally would like to live for 5 more years in able to help her granddaughter pay off vet school. Her respiratory status waxed and waned at times throughout the next few days, usually responsive to diuresis. She was able to be weaned off of high flow O2 and by 7/12 was down to 6L [place] for FiO2, but was still feeling weak and not quite back to baseline. Disposition was discussed and she would prefer some sort of inpatient facility. PT recommended CIR which was approved. 7/14 she was deemed a candidate to discharge to CIR.   Discharge Plan by active problems   Acute on chronic respiratory failure with hypoxemia: confirmed parainfluenza-3 viral infection with underlying severe, end stage ILD. While not initially a large contributing factor, CHF has recently been playing more of a role in her dyspnea.   > keep solumedrol but taper as able (reduced 7/13) - can probably  prednisone 7/15 if no decline > was on pred taper to off over 3 months prior to admission.  > Lasix 60mg  TID to PO, renal function seems to be tolerating increased dose. > BIPAP qHS, she feels BiPAP here is weaker than home. She will call home to get settings.  > incentive spirometry > continue to hold esbriet > will need rapid pulmonary follow up with Dr. Kendrick Fries at discharge from [facility]  Chronic diastolic congestive heart failure> As noted above this is not clearly an exacerbation of her heart failure at admit  Hypokalemia: secondary to diuresis > Lasix as above > follow daily basic metabolic panel in am  > ContinueZaroxolyn  paroxysmal A. Fib > Continue diltiazem > Continue warfarin  History of recurrent DVT > continue warfarin per pharmacy  OSA/OHVS > BIPAP  qHS   Significant Hospital tests/ studies   7/7 echo 60%, pa 55, RV appears dilated and hypertrophied On parasternal images ? mass in RV but I think this is just a tangential cut of thickened RV. Can consider f/u MRI or TEE if clinically indicated  11/19/14: Confirmed to have PIV-3 infection.  Consults   Discharge Exam: BP 130/57 mmHg  Pulse 66  Temp(Src) 97.5 F (36.4 C) (Axillary)  Resp 19  Ht  (1.626 m)  Wt 131.2 kg (289 lb 3.9 oz)  BMI 49.62 kg/m2  SpO2 92%  PHYSICAL EXAMINATION: General: In chair , obese, looks deconditioned, no distress Neuro: Awake, alert, no distress HEENT: Obese neck Cardiovascular:s1 s2 RRR no r Lungs: resps even non labored on 6L [place], diminished bases, no audible wheeze  Abdomen: Bowel sounds positive nontender nondistended Musculoskeletal: Normal bulk and tone Skin: Chronic right leg wound well healed no oozing, 1-2+ BLE edema   Labs at discharge Lab Results  Component Value Date   CREATININE 1.61* 11/24/2014   BUN 73* 11/24/2014   NA 142 11/24/2014   K 3.6 11/24/2014   CL 87* 11/24/2014   CO2 44* 11/24/2014   Lab Results  Component Value Date   WBC 15.0* 11/24/2014   HGB 15.0 11/24/2014   HCT 46.2* 11/24/2014   MCV 100.0 11/24/2014   PLT 258 11/24/2014   Lab Results  Component Value Date   ALT 44 11/19/2014   AST 41 11/19/2014   ALKPHOS 48 11/19/2014   BILITOT 0.7 11/19/2014   Lab Results  Component Value Date   INR 1.97* 11/24/2014   INR 2.30* 11/23/2014   INR 2.72* 11/22/2014    Current radiology studies Dg Chest Portable 1 View  11/24/2014   CLINICAL DATA:  Respiratory failure  EXAM: PORTABLE CHEST - 1 VIEW  COMPARISON:  Portable chest x-ray of November 22, 2014  FINDINGS: The lungs are reasonably well inflated. The interstitial markings are coarse and remain increased bilaterally. The left lower lobe is dense and there is obscuration of the left hemidiaphragm consistent with pleural fluid. The cardiac  silhouette is enlarged. The pulmonary vascularity is prominent centrally. There is pleural fluid versus pleural thickening over the superior lateral aspects of both lungs. The bony thorax exhibits no acute abnormality.  IMPRESSION: There has not been significant change since yesterday's study. CHF with small bilateral pleural effusions and interstitial edema and/or bilateral interstitial pneumonia persists.   Electronically Signed   By: David  [place] M.D.   On: 11/24/2014 07:42    Disposition:  06-Home-Health Care Svc      Discharge Instructions    Diet - low sodium heart healthy    Complete by:  As directed      Increase activity slowly    Complete by:  As directed             Medication List    STOP taking these medications        predniSONE 5 MG tablet  Commonly known as:  DELTASONE     SYSTANE OP      TAKE these medications  albuterol (2.5 MG/3ML) 0.083% nebulizer solution  Commonly known as:  PROVENTIL  Take 3 mLs (2.5 mg total) by nebulization every 2 (two) hours as needed for wheezing or shortness of breath.     albuterol (2.5 MG/3ML) 0.083% nebulizer solution  Commonly known as:  PROVENTIL  Take 3 mLs (2.5 mg total) by nebulization every 6 (six) hours.     allopurinol 300 MG tablet  Commonly known as:  ZYLOPRIM  Take 1 tablet (300 mg total) by mouth daily.     aspirin 81 MG tablet  Take 81 mg by mouth daily.     atorvastatin 10 MG tablet  Commonly known as:  LIPITOR  Take 1 tablet (10 mg total) by mouth daily.     budesonide 0.25 MG/2ML nebulizer solution  Commonly known as:  PULMICORT  Take 0.25 mg by nebulization 2 (two) times daily.     colchicine 0.6 MG tablet  Take 0.6 mg by mouth as needed (for flareup).     diltiazem 240 MG 24 hr capsule  Commonly known as:  CARDIZEM CD  Take 1 capsule (240 mg total) by mouth daily.     escitalopram 20 MG tablet  Commonly known as:  LEXAPRO  Take 1 tablet (20 mg total) by mouth daily.     famotidine  20 MG tablet  Commonly known as:  PEPCID  Take 1 tablet (20 mg total) by mouth daily.     ferrous sulfate 325 (65 FE) MG tablet  Take 1 tablet (325 mg total) by mouth 2 (two) times daily with a meal.     furosemide 20 MG tablet  Commonly known as:  LASIX  Take 3 tablets (60 mg total) by mouth 3 (three) times daily.     gabapentin 300 MG capsule  Commonly known as:  NEURONTIN  Take 1 capsule (300 mg total) by mouth daily.     methylPREDNISolone sodium succinate 40 mg/mL injection  Commonly known as:  SOLU-MEDROL  Inject 1 mL (40 mg total) into the vein every 12 (twelve) hours.     metolazone 5 MG tablet  Commonly known as:  ZAROXOLYN  Take 1 tablet (5 mg total) by mouth daily as needed.     MULTIVITAMIN & MINERAL PO  Take 1 tablet by mouth daily.     nystatin 100000 UNIT/GM Powd  Apply to flank and groin and under breasts BID     OVER THE COUNTER MEDICATION  Inhale 1 application into the lungs at bedtime. USES BIPAP EVERY BEDTIME FOR SLEEP     potassium chloride SA 20 MEQ tablet  Commonly known as:  K-DUR,KLOR-CON  Take 2 tablets (40 mEq total) by mouth 3 (three) times daily.     warfarin 2.5 MG tablet  Commonly known as:  COUMADIN  Take as directed.     ZYRTEC ALLERGY 10 MG tablet  Generic drug:  cetirizine  Take 10 mg by mouth daily.         Discharged Condition: fair  Greater than 30 minutes of time have been dedicated to discharge assessment, planning and discharge instructions.   Signed: Joneen RoachPaul Sharmeka Palmisano, AGACNP-BC [facility] Pager [phone] or [phone]  11/24/2014 3:42 PM

## 2014-11-25 ENCOUNTER — Inpatient Hospital Stay (HOSPITAL_COMMUNITY): Payer: Medicare Other

## 2014-11-25 ENCOUNTER — Inpatient Hospital Stay (HOSPITAL_COMMUNITY): Payer: Medicare Other | Admitting: *Deleted

## 2014-11-25 ENCOUNTER — Telehealth: Payer: Self-pay | Admitting: *Deleted

## 2014-11-25 ENCOUNTER — Inpatient Hospital Stay (HOSPITAL_COMMUNITY): Payer: Medicare Other | Admitting: Occupational Therapy

## 2014-11-25 DIAGNOSIS — N189 Chronic kidney disease, unspecified: Secondary | ICD-10-CM

## 2014-11-25 LAB — PROTIME-INR
INR: 1.9 — ABNORMAL HIGH (ref 0.00–1.49)
Prothrombin Time: 21.7 seconds — ABNORMAL HIGH (ref 11.6–15.2)

## 2014-11-25 MED ORDER — WARFARIN SODIUM 2 MG PO TABS
2.0000 mg | ORAL_TABLET | Freq: Once | ORAL | Status: AC
  Administered 2014-11-25: 2 mg via ORAL
  Filled 2014-11-25: qty 1

## 2014-11-25 MED ORDER — ALBUTEROL SULFATE (2.5 MG/3ML) 0.083% IN NEBU
2.5000 mg | INHALATION_SOLUTION | Freq: Three times a day (TID) | RESPIRATORY_TRACT | Status: DC
  Administered 2014-11-26 – 2014-12-02 (×19): 2.5 mg via RESPIRATORY_TRACT
  Filled 2014-11-25 (×20): qty 3

## 2014-11-25 NOTE — Evaluation (Signed)
Physical Therapy Assessment and Plan  Patient Details  Name: [name] [name] MRN: [id number] Date of Birth: 05-07-1932  PT Diagnosis: Abnormality of gait and Muscle weakness Rehab Potential: Good ELOS: 7 days   Today's Date: 11/25/2014 PT Individual Time: 0830-0930 PT Individual Time Calculation (min): 60 min    Problem List:  Patient Active Problem List   Diagnosis Date Noted  . Debilitated 11/24/2014  . Acute respiratory failure with hypoxia 11/16/2014  . Pulmonary edema   . Pulmonary fibrosis   . Chronic anticoagulation 10/13/2014  . Hypoxia 10/11/2014  . Steroid toxicity 10/05/2014  . Leg swelling 10/04/2014  . AKI (acute kidney injury)   . Acute on chronic respiratory failure 08/02/2014  . History of DVT (deep vein thrombosis) 08/01/2014  . CAP (community acquired pneumonia) 08/01/2014  . SOB (shortness of breath) 08/01/2014  . Interstitial lung disease 08/01/2014  . Acute bronchitis 04/26/2014  . Counseling regarding end of life decision making 04/12/2014  . ILD (interstitial lung disease) 04/05/2014  . Encounter for anticoagulation discussion and counseling 04/05/2014  . Acute on chronic respiratory failure with hypoxemia 02/21/2014  . Non-compliant behavior 02/18/2014  . CHF exacerbation 02/18/2014  . Hx pulmonary embolism 02/18/2014  . Peripheral neuropathy 09/17/2013  . Acute-on-chronic respiratory failure 08/31/2013  . Acute on chronic diastolic heart failure 82/50/0370  . Hypokalemia 08/24/2013  . Encounter for therapeutic drug monitoring 07/28/2013  . CKD (chronic kidney disease) stage 3, GFR 30-59 ml/min 06/24/2013  . Iron deficiency anemia 06/24/2013  . Chronic hypoxemic respiratory failure 09/29/2012  . Wound of lower extremity due to hematoma 08/16/2012  . Gout, chronic 02/12/2012  . Pure hypercholesterolemia 07/09/2010  . SLEEP RELATED HYPOVENTILATION/HYPOXEMIA CCE 07/03/2010  . OBESITY-MORBID (>100') 04/04/2009  . DIASTOLIC HEART FAILURE, CHRONIC  02/02/2009  . Abdominal aortic aneurysm 12/23/2008  . POSTHERPETIC NEURALGIA 03/09/2008  . Allergic rhinitis 12/02/2007  . DEPRESSION, ACUTE 06/03/2007  . MENINGITIS, HX OF 11/26/2006  . SHINGLES, HX OF 11/26/2006  . Essential hypertension 11/25/2006  . EMBOLISM & INFARCTION, IATROGENIC PULMONARY 11/25/2006  . PAF (paroxysmal atrial fibrillation) 11/25/2006  . THROMBOSIS, VENOUS NOS 11/25/2006  . Obstructive sleep apnea 11/25/2006    Past Medical History:  Past Medical History  Diagnosis Date  . Congestive heart failure, unspecified     a. ECHO 6/0: EF 55-60%, mild LVH, mildly dilated RV w. mildly dec fx, RVSP 67  . Chronic atrial fibrillation     failed cardioversion in past  -repeat DC-CV on October 5th, 2010  . Personal history of DVT (deep vein thrombosis) 2008    LLE  . HTN (hypertension)   . Morbid obesity   . Shingles     with postherpetic neuraigia  . GERD (gastroesophageal reflux disease)   . Hyperlipidemia   . Meningitis ~ 1960    hospitalized @ [facility]  . Gallstones     s/p cholecystectomy  . CAD (coronary artery disease)     nonobstructive by cath 8/10 - LAD 40-50% w mild PAH mean 29 w PVR 3.2 [place]  . PE (pulmonary embolism) ~ 2010    LLE  . Shortness of breath dyspnea   . Lung interstitial disease   . Hypoxemia   . COPD (chronic obstructive pulmonary disease)   . On home oxygen therapy     "5L; 24/7" (10/11/2014)  . OSA treated with BiPAP     "I think it's BiPAP"  . AAA (abdominal aortic aneurysm)     small  . History of blood transfusion   .  Anemia   . Iron deficiency anemia   . History of blood transfusion     "related to nose bleed"  . Gout   . Depression   . Chronic kidney disease (CKD), stage II (mild)    Past Surgical History:  Past Surgical History  Procedure Laterality Date  . Appendectomy  1947  . Tonsillectomy  1946  . Cardioversion  01/2007    x2  . Right heart catheterization N/A 02/28/2014    Procedure: RIGHT HEART CATH;   Surgeon: Larey Dresser, MD;  Location: [facility] CATH LAB;  Service: Cardiovascular;  Laterality: N/A;  . Cardiac catheterization  02/2014  . Cataract extraction w/ intraocular lens  implant, bilateral Bilateral     Assessment & Plan Clinical Impression: [name] [name] is a 81 y.o. right handed female with history of chronic renal insufficiency with creatinine 1.19, pulmonary fibrosis with home oxygen of 5 L, diastolic congestive heart failure, chronic atrial fibrillation maintained on Coumadin therapy. Patient lives with her son and also home health aide 5 hours a day 5 days a week. She used a walker to help aid in mobility. Presented 11/16/2014 with increasing shortness of breath, fever ,cough. Chest x-ray with no acute abnormalities no evidence of pulmonary edema. Appearance consistent with chronic interstitial lung disease. Pulmonary service follow-up suspect acute viral pneumonitis placed on Solu-Medrol taper continues with diuresis. Echocardiogram with ejection fraction of 46% normal systolic function. Chronic Coumadin ongoing. Contact precautions for MRSA PCR screening positive.   Patient transferred to CIR on 11/24/2014 .   Patient currently requires min with mobility secondary to muscle weakness and decreased cardiorespiratoy endurance and decreased oxygen support.  Prior to hospitalization, patient was supervision with mobility and lived with Son in a House home.  Home access is 2Stairs to enter.  Patient will benefit from skilled PT intervention to maximize safe functional mobility and minimize fall risk for planned discharge home with 24 hour supervision.  Anticipate patient will benefit from follow up [place] at discharge.  PT - End of Session Activity Tolerance: Tolerates < 10 min activity with changes in vital signs Endurance Deficit: Yes Endurance Deficit Description: 20' ambulation SpO2 drops to 85% on 6L O2 PT Assessment Rehab Potential (ACUTE/IP ONLY): Good PT Patient demonstrates  impairments in the following area(s): Balance;Endurance;Motor;Safety PT Transfers Functional Problem(s): Bed Mobility;Bed to Chair;Car;Furniture PT Locomotion Functional Problem(s): Ambulation;Stairs PT Plan PT Intensity: Minimum of 1-2 x/day ,45 to 90 minutes PT Frequency: 5 out of 7 days PT Duration Estimated Length of Stay: 7 days PT Treatment/Interventions: Ambulation/gait training;Balance/vestibular training;Discharge planning;DME/adaptive equipment instruction;Functional mobility training;Therapeutic Activities;UE/LE Strength taining/ROM;Therapeutic Exercise;Stair training;Patient/family education PT Transfers Anticipated Outcome(s): mod I PT Locomotion Anticipated Outcome(s): supervison short household distances PT Recommendation Follow Up Recommendations: Home health PT Patient destination: Home Equipment Recommended: None recommended by PT  Skilled Therapeutic Intervention Patient seen for 1:1 session; evaluation procedures explained and history obtained.  Patient seated in recliner performing both sit to stand and stand pivot transfers with RW and min assist.  Patient needing up to 3 minute rest periods between mobility activities due to desaturation and fatigue.  Reports she hopes to regain ability to walk to bathroom without seated rest as she was prior to getting ill in October last year.  She performed sit<>supine with supervision to hospital bed, initially wiht HOB flat, but elevated for comfort once supine.  Patient propelled wheelchair short distance on way to therapy gym with supervision and cues, limited by fatigue.  Negotiated 3 steps (3") with  min assist and sideways technique two hands to left rail, then ambulated 20' with RW and min assist.  Assisted back to recliner in her room with all needs in reach.  PT Evaluation Precautions/Restrictions Precautions Precautions: Fall Precaution Comments: monitor O2 sats General   Vital SignsTherapy Vitals Pulse Rate: 85 Oxygen  Therapy SpO2: (!) 85 % (with ambulation) O2 Device: Nasal Cannula O2 Flow Rate (L/min): 6 L/min Pain Pain Assessment Pain Assessment: No/denies pain Pain Score: 5  Pain Type: Chronic pain Pain Location: Flank Pain Orientation: Right Pain Descriptors / Indicators: Nagging Pain Intervention(s): Rest;Repositioned Home Living/Prior Functioning Home Living Available Help at Discharge: Family;Available 24 hours/day Type of Home: House Home Access: Stairs to enter [employer] of Steps: 2 Entrance Stairs-Rails: Right;Left Home Layout: One level Bathroom Shower/Tub: Psychologist, counselling (w/ built in seat and grabbars) Bathroom Toilet: Handicapped height Additional Comments: son, Thayer Ohm, lives with pt. two other sons local very supportive; has bed that elevates, lift chair, RW w/2 wheels, BSC, transport chair  Lives With: Son Prior Function Level of Independence: Needs assistance with ADLs;Requires assistive device for independence;Independent with transfers;Needs assistance with homemaking;Independent with gait  Able to Take Stairs?: Yes Driving: No Comments: reports independent with short distance ambulation in the home, but usually someone there when walking to bathroom. since October has needed rest break on way to bathroom, but prior was able to walk all the way without rest Vision/Perception     Cognition Overall Cognitive Status: Within Functional Limits for tasks assessed Arousal/Alertness: Awake/alert Orientation Level: Oriented X4 Memory: Appears intact Problem Solving: Appears intact Safety/Judgment: Appears intact Comments: Very talkative, sharing info about family during rest breaks Sensation Sensation Light Touch: Appears Intact Stereognosis: Not tested Hot/Cold: Not tested Proprioception: Not tested Coordination Gross Motor Movements are Fluid and Coordinated: Not tested Motor  Motor Motor: Abnormal postural alignment and control Motor - Skilled Clinical  Observations: kyphotic with flexed posture in standing  Mobility Bed Mobility Bed Mobility: Sit to Supine;Supine to Sit Supine to Sit: 5: Supervision;HOB elevated Supine to Sit Details (indicate cue type and reason): increased time and moved each leg independently into bed having to readjust once in they were on the bed Sit to Supine: 5: Supervision;HOB flat Sit to Supine - Details (indicate cue type and reason): elevated HOB for improved aeration  Transfers Transfers: Yes Sit to Stand: 4: Min assist Sit to Stand Details (indicate cue type and reason): cues for scooting to edge of chair/bed Stand to Sit: 4: Min guard Stand to Sit Details: cues for feeling seat with back of legs and reaching for seat/armrest Stand Pivot Transfers: 4: Min guard Stand Pivot Transfer Details (indicate cue type and reason): w/RW, assist for balance/safety/O2 tubing Locomotion  Ambulation Ambulation: Yes Ambulation/Gait Assistance: 4: Min assist Ambulation Distance (Feet): 20 Feet Assistive device: Rolling walker Ambulation/Gait Assistance Details: verbal cues for posture, breathing Gait Gait: Yes Gait Pattern: Impaired Gait Pattern: Step-through pattern;Shuffle;Decreased stride length;Trunk flexed;Poor foot clearance - left;Poor foot clearance - right Gait velocity: slow pace Stairs / Additional Locomotion Stairs: Yes Stairs Assistance: 4: Min assist Stair Management Technique: One rail Left;Sideways Number of Stairs: 3 (limited number due to dyspnea, SpO2 80% on 6 L after stair negotiation) Height of Stairs: 3 Wheelchair Mobility Wheelchair Mobility: Yes Wheelchair Assistance: 5: Financial planner Details: Verbal cues for Diplomatic Services operational officer: Both upper extremities Wheelchair Parts Management: Needs assistance Distance: 40'  Trunk/Postural Assessment  Cervical Assessment Cervical Assessment: Exceptions to [facility] (forward head posture) Thoracic Assessment Thoracic  Assessment: Exceptions to [facility] (kyphotic posture) Lumbar Assessment Lumbar Assessment: Within Functional Limits Postural Control Postural Control: Deficits on evaluation Postural Limitations: due to generalized weakness  Balance Balance Balance Assessed: Yes Static Standing Balance Static Standing - Balance Support: Bilateral upper extremity supported Static Standing - Level of Assistance: 5: Stand by assistance Dynamic Standing Balance Dynamic Standing - Balance Support: Left upper extremity supported;During functional activity Dynamic Standing - Level of Assistance: 4: Min assist Dynamic Standing - Balance Activities: Reaching for objects Extremity Assessment      RLE Assessment RLE Assessment: Exceptions to [facility] RLE AROM (degrees) RLE Overall AROM Comments: grossly WFL RLE Strength RLE Overall Strength Comments: Hip flexion 3/5, knee extension 4-/5, knee flexion 3-/5, ankle DF 4-/5 LLE Assessment LLE Assessment: Exceptions to WFL LLE AROM (degrees) LLE Overall AROM Comments: grossly WFL LLE Strength LLE Overall Strength Comments: Hip flexion 3/5, knee extension 4-/5, knee flexion 3-/5, ankle DF 3+/5  FIM:  FIM - Control and instrumentation engineer Devices: Walker;Arm rests Bed/Chair Transfer: 5: Supine > Sit: Supervision (verbal cues/safety issues);5: Sit > Supine: Supervision (verbal cues/safety issues);4: Bed > Chair or W/C: Min A (steadying Pt. > 75%);4: Chair or W/C > Bed: Min A (steadying Pt. > 75%) FIM - Locomotion: Wheelchair Distance: 40' Locomotion: Wheelchair: 1: Travels less than 50 ft with supervision, cueing or coaxing FIM - Locomotion: Ambulation Locomotion: Ambulation Assistive Devices: Administrator Ambulation/Gait Assistance: 4: Min assist Locomotion: Ambulation: 1: Travels less than 50 ft with minimal assistance (Pt.>75%) FIM - Locomotion: Stairs Locomotion: Scientist, physiological: Hand rail - 1 Locomotion: Stairs: 1: Up and Down < 4  stairs with minimal assistance (Pt.>75%)   Refer to Care Plan for Long Term Goals  Recommendations for other services: None  Discharge Criteria: Patient will be discharged from PT if patient refuses treatment 3 consecutive times without medical reason, if treatment goals not met, if there is a change in medical status, if patient makes no progress towards goals or if patient is discharged from hospital.  The above assessment, treatment plan, treatment alternatives and goals were discussed and mutually agreed upon: by patient  [facility] 11/25/2014, 10:28 AM  Magda Kiel, [place] 11/25/2014

## 2014-11-25 NOTE — IPOC Note (Signed)
Overall Plan of Care [facility](IPOC) Patient Details Name: Aneta Minseggy T Mcclay MRN: [id number] DOB: 01/17/1932  Admitting Diagnosis: deconditioned  Hospital Problems: Active Problems:   Debilitated     Functional Problem List: Nursing Bladder, Motor, Safety, Skin Integrity  PT Balance, Endurance, Motor, Safety  OT Safety, Endurance  SLP    TR         Basic ADL's: OT Bathing, Dressing, Toileting     Advanced  ADL's: OT       Transfers: PT Bed Mobility, Bed to Chair, Car, Occupational psychologisturniture  OT Toilet, Research scientist (life sciences)Tub/Shower     Locomotion: PT Ambulation, Stairs     Additional Impairments: OT    SLP        TR      Anticipated Outcomes Item Anticipated Outcome  Self Feeding Independent  Swallowing      Basic self-care  Mod Assist  Toileting  Mod I   Bathroom Transfers Supervision  Bowel/Bladder  Continent to bowel and bladder with mod. assisst.  Transfers  mod I  Locomotion  supervison short household distances  Communication     Cognition     Pain  Less than 3,on scale 1 to 10  Safety/Judgment  Free from falls during her stay in rehab.   Therapy Plan: PT Intensity: Minimum of 1-2 x/day ,45 to 90 minutes PT Frequency: 5 out of 7 days PT Duration Estimated Length of Stay: 7 days OT Intensity: Minimum of 1-2 x/day, 45 to 90 minutes OT Frequency: 5 out of 7 days OT Duration/Estimated Length of Stay: 7 days         Team Interventions: Nursing Interventions Patient/Family Education, Bladder Management, Disease Management/Prevention  PT interventions Ambulation/gait training, Balance/vestibular training, Discharge planning, DME/adaptive equipment instruction, Functional mobility training, Therapeutic Activities, UE/LE Strength taining/ROM, Therapeutic Exercise, Stair training, Patient/family education  OT Interventions Therapeutic Exercise, Therapeutic Activities, Self Care/advanced ADL retraining, Discharge planning, DME/adaptive equipment instruction, UE/LE Strength taining/ROM,  Patient/family education, Skin care/wound managment, Functional mobility training, UE/LE Coordination activities, Disease mangement/prevention  SLP Interventions    TR Interventions    SW/CM Interventions Discharge Planning, Psychosocial Support, Patient/Family Education    Team Discharge Planning: Destination: PT-Home ,OT- Home , SLP-  Projected Follow-up: PT-Home health PT, OT-  None, SLP-  Projected Equipment Needs: PT-None recommended by PT, OT- To be determined, SLP-  Equipment Details: PT- , OT-  Patient/family involved in discharge planning: PT- Patient,  OT-Patient, SLP-   MD ELOS: 7 days Medical Rehab Prognosis:  Excellent Assessment: The patient has been admitted for CIR therapies with the diagnosis of debility after acute on chronic respiratory failure. The team will be addressing functional mobility, strength, stamina, balance, safety, adaptive techniques and equipment, self-care, bowel and bladder mgt, patient and caregiver education, pacing, respiratory tolerance, pain mgt, edema control, breathing techniques. Goals have been set at mod I to supervision for basic self-care and mobility.    Ranelle OysterZachary T. Yvetta Drotar, MD, FAAPMR      See Team Conference Notes for weekly updates to the plan of care

## 2014-11-25 NOTE — Telephone Encounter (Signed)
Received form to be filled out and faxed back to [employer] [employer] in regards to pt's Esbriet. Contacted pt's son Aneta Minshillip to inquire whether or not pt started Esbriet that was sent to her per the company. He states that he is not sure what she takes and he states she is in the hospital and that the Esbriet is probably what put her in the hospital. Says her meds are changed everytime she is in the hospital so we need to contact the providers there.   Will fax the form back to [employer] [employer] at [phone] and [facility] to put with PA.   Will forward message to BQ and Morrie Sheldonshley and will await further information from [employer] [employer].

## 2014-11-25 NOTE — Progress Notes (Signed)
[facility] PHYSICAL [facility]ICINE & REHABILITATION     PROGRESS NOTE    Subjective/Complaints: Had a reasonable night. Breathing at current baseline. Liked that she wasn't interrupted as much.   ROS: Pt denies fever, rash/itching, headache, blurred or double vision, nausea, vomiting, abdominal pain, diarrhea, chest pain, palpitations, dysuria, dizziness, neck or back pain, bleeding, anxiety, or depression   Objective: Vital Signs: Blood pressure 144/69, pulse 68, temperature 97.5 F (36.4 C), temperature source Axillary, resp. rate 20, weight 127.461 kg (281 lb), SpO2 96 %. Dg Chest Portable 1 View  11/24/2014   CLINICAL DATA:  Respiratory failure  EXAM: PORTABLE CHEST - 1 VIEW  COMPARISON:  Portable chest x-ray of November 22, 2014  FINDINGS: The lungs are reasonably well inflated. The interstitial markings are coarse and remain increased bilaterally. The left lower lobe is dense and there is obscuration of the left hemidiaphragm consistent with pleural fluid. The cardiac silhouette is enlarged. The pulmonary vascularity is prominent centrally. There is pleural fluid versus pleural thickening over the superior lateral aspects of both lungs. The bony thorax exhibits no acute abnormality.  IMPRESSION: There has not been significant change since yesterday's study. CHF with small bilateral pleural effusions and interstitial edema and/or bilateral interstitial pneumonia persists.   Electronically Signed   By: [name]  [name] M.D.   On: 11/24/2014 07:42    Recent Labs  11/23/14 0521 11/24/14 0517  WBC 12.9* 15.0*  HGB 14.5 15.0  HCT 44.9 46.2*  PLT 226 258    Recent Labs  11/23/14 0521 11/24/14 0517  NA 140 142  K 3.2* 3.6  CL 88* 87*  GLUCOSE 161* 166*  BUN 70* 73*  CREATININE 1.62* 1.61*  CALCIUM 9.0 9.4   CBG (last 3)  No results for input(s): GLUCAP in the last 72 hours.  Wt Readings from Last 3 Encounters:  11/25/14 127.461 kg (281 lb)  11/24/14 131.2 kg (289 lb 3.9 oz)  10/20/14  133.358 kg (294 lb)    Physical Exam:  Constitutional: She is oriented to person, place, and time.  81 year old right-handed obese female  HENT:  Head: Normocephalic.  Eyes: EOM are normal.  Neck: Normal range of motion. Neck supple. No thyromegaly present.  Cardiovascular:  Cardiac rate controlled. No murmurs.  Respiratory:  Decreased breath sounds at the bases and decreased air movement overall GI: Soft. Bowel sounds are normal. She exhibits no distension.  Neurological: She is alert and oriented to person, place, and time. No cranial nerve deficit. Coordination normal.  Follows commands. UES 5/5 prox to distal. LES: 2/5 HF, 3/5 KE and 4/5 ankles. Decreased LT bilateral distal legs/feet below knees.  Skin: Skin with chronic changes bilaterally. Multiple scars, wounds on both legs. Numerous bruises and ecchymoses on the UE's.  Psychiatric: She has a normal mood and affect. Her behavior is normal.    Assessment/Plan: 1. Functional deficits secondary to debility related to acute on chronic respiratory failure which require 3+ hours per day of interdisciplinary therapy in a comprehensive inpatient rehab setting. Physiatrist is providing close team supervision and 24 hour management of active medical problems listed below. Physiatrist and rehab team continue to assess barriers to discharge/monitor patient progress toward functional and medical goals. FIM:                                  Medical Problem List and Plan:  1. Functional deficits secondary to debilitation related to acute on  chronic respiratory failure with pulmonary fibrosis with chronic oxygen therapy/Solu-Medrol per pulmonary services intravenous for now (patient was on a prednisone taper to off over 3 months prior to admission) 2. DVT history/Anticoagulation: Chronic Coumadin therapy.---no bleeding issues at present 3. Pain Management: Neurontin 300 mg daily. 4. Mood/depression: Lexapro 20 mg  daily. In good spirits currently 5. Neuropsych: This patient is capable of making decisions on her own behalf. 6. Skin/Wound Care: continue local care, pressure relief to multiple areas  7. Fluids/Electrolytes/Nutrition: I personally reviewed the patient's labs today.  8. Hypertension/atrial fibrillation. Cardizem 240 mg daily, Zaroxolyn 5 mg daily 9. Diastolic congestive heart failure. Continue Lasix as directed 40 mg 3 times a day.  -weight 127.5kg---follow daily  -watch i's and o's 10. MRSA PCR screening positive. Contact precautions 11. Hyperlipidemia. Lipitor 12. Chronic renal insufficiency. Baseline creatinine 1.19-1.61. Follow-up chemistries consistent today   LOS (Days) 1 A FACE TO FACE EVALUATION WAS PERFORMED  [name] [name] T 11/25/2014 8:13 AM

## 2014-11-25 NOTE — Care Management Note (Signed)
[facility]patient Rehabilitation Center Individual Statement of Services  Patient Name:  [name] [name]  Date:  11/25/2014  Welcome to the Inpatient Rehabilitation Center.  Our goal is to provide you with an individualized program based on your diagnosis and situation, designed to meet your specific needs.  With this comprehensive rehabilitation program, you will be expected to participate in at least 3 hours of rehabilitation therapies Monday-Friday, with modified therapy programming on the weekends.  Your rehabilitation program will include the following services:  Physical Therapy (PT), Occupational Therapy (OT), 24 hour per day rehabilitation nursing, Therapeutic Recreaction (TR), Case Management (Social Worker), Rehabilitation Medicine, Nutrition Services and Pharmacy Services  Weekly team conferences will be held on Tuesdays to discuss your progress.  Your Social Worker will talk with you frequently to get your input and to update you on team discussions.  Team conferences with you and your family in attendance may also be held.  Expected length of stay: 7 days  Overall anticipated outcome: supervision/ mod independent  Depending on your progress and recovery, your program may change. Your Social Worker will coordinate services and will keep you informed of any changes. Your Social Worker's name and contact numbers are listed  below.  The following services may also be recommended but are not provided by the Inpatient Rehabilitation Center:   Driving Evaluations  Home Health Rehabiltiation Services  Outpatient Rehabilitation Services    Arrangements will be made to provide these services after discharge if needed.  Arrangements include referral to agencies that provide these services.  Your insurance has been verified to be:  Medicare Your primary doctor is:  Amy Bledsoe  Pertinent information will be shared with your doctor and your insurance company.  Social Worker:  GirardLucy Shanon Seawright, [place]  [phone] or [phone]   Information discussed with and copy given to patient by: Amada JupiterHOYLE, Jerald Villalona, 11/25/2014, 1:32 PM

## 2014-11-25 NOTE — Progress Notes (Signed)
Per Pt Albuterol is taken TID at home.  RT assessed and order proper regimen for Pt.  RT to monitor and assess as needed.

## 2014-11-25 NOTE — Progress Notes (Signed)
ANTICOAGULATION CONSULT NOTE - Follow Up Consult  Pharmacy Consult for coumadin Indication: atrial fibrillation and hx of DVT/PE  Allergies  Allergen Reactions  . Pirfenidone Other (See Comments)    Congestion, breathing diffulty    Patient Measurements: Weight: 281 lb (127.461 kg) Heparin Dosing Weight:   Vital Signs: Temp: 97.5 F (36.4 C) (07/15 0405) Temp Source: Axillary (07/15 0405) BP: 144/69 mmHg (07/15 0405) Pulse Rate: 68 (07/15 0405)  Labs:  Recent Labs  11/22/14 2205 11/23/14 0521 11/24/14 0517  HGB  --  14.5 15.0  HCT  --  44.9 46.2*  PLT  --  226 258  LABPROT  --  25.1* 22.3*  INR  --  2.30* 1.97*  CREATININE 1.96* 1.62* 1.61*    Estimated Creatinine Clearance: 36.3 mL/min (by C-G formula based on Cr of 1.61).   Medications:  Scheduled:  . albuterol  2.5 mg Nebulization Q6H  . allopurinol  300 mg Oral Daily  . aspirin EC  81 mg Oral Daily  . atorvastatin  10 mg Oral Daily  . budesonide  0.25 mg Nebulization BID  . diltiazem  240 mg Oral Daily  . escitalopram  20 mg Oral Daily  . famotidine  20 mg Oral Daily  . ferrous sulfate  325 mg Oral BID WC  . furosemide  60 mg Oral TID  . gabapentin  300 mg Oral Daily  . loratadine  10 mg Oral Daily  . methylPREDNISolone (SOLU-MEDROL) injection  40 mg Intravenous Q12H  . metolazone  5 mg Oral Daily  . mupirocin ointment  1 application Nasal BID  . Warfarin - Pharmacist Dosing Inpatient   Does not apply q1800   Infusions:    Assessment: 81 yo female with hx of afib and DVT/PE is currently on therapeutic coumadin.  INR today is 1.9.  Per MAR, it seems like coumadin 3 mg was given twice last night.  Goal of Therapy:  INR 1.8-2.2 Monitor platelets by anticoagulation protocol: Yes   Plan:  Coumadin 2 mg po x1 since may have given 6 mg total yesterday  Daily PT/INR   Ahan Eisenberger, Tsz-Yin 11/25/2014,8:22 AM

## 2014-11-25 NOTE — Progress Notes (Signed)
Pt wants BIPAP at 2300.  Pt will call if she decides she wants before then.  RT to monitor and assess as needed.

## 2014-11-25 NOTE — H&P (View-Only) (Signed)
Physical Medicine and Rehabilitation Admission H&P   Chief complaint: Weakness  HPI: [name] [name] is a 81 y.o. right handed female with history of chronic renal insufficiency with creatinine 1.19, pulmonary fibrosis with home oxygen of 5 L, diastolic congestive heart failure, chronic atrial fibrillation maintained on Coumadin therapy. Patient lives with her son and also home health aide 5 hours a day 5 days a week. She used a walker to help aid in mobility. Presented 11/16/2014 with increasing shortness of breath, fever ,cough. Chest x-ray with no acute abnormalities no evidence of pulmonary edema. Appearance consistent with chronic interstitial lung disease. Pulmonary service follow-up suspect acute viral pneumonitis placed on Solu-Medrol taper continues with diuresis. Echocardiogram with ejection fraction of 25% normal systolic function. Chronic Coumadin ongoing. Contact precautions for MRSA PCR screening positive. Physical therapy evaluation completed 11/23/2014 with recommendations of physical medicine rehabilitation consult. Patient was admitted for comprehensive rehabilitation program  Patient states that she had trouble with oxygen desaturation even prior to admission to the hospital.  ROS Review of Systems  Constitutional: Positive for fever. Negative for chills.  Eyes: Negative for blurred vision and double vision.  Respiratory: Positive for cough and shortness of breath.  Cardiovascular: Positive for palpitations and leg swelling.  Gastrointestinal: Positive for heartburn and constipation.   GERD  Musculoskeletal: Positive for myalgias and joint pain.  Skin: Negative for rash.  Neurological: Negative for dizziness and headaches.  Psychiatric/Behavioral: Positive for depression   Past Medical History  Diagnosis Date  . Congestive heart failure, unspecified     a. ECHO 6/0: EF 55-60%, mild LVH, mildly dilated RV w. mildly dec fx, RVSP 67  . Chronic  atrial fibrillation     failed cardioversion in past -repeat DC-CV on October 5th, 2010  . Personal history of DVT (deep vein thrombosis) 2008    LLE  . HTN (hypertension)   . Morbid obesity   . Shingles     with postherpetic neuraigia  . GERD (gastroesophageal reflux disease)   . Hyperlipidemia   . Meningitis ~ 1960    hospitalized @ [facility]  . Gallstones     s/p cholecystectomy  . CAD (coronary artery disease)     nonobstructive by cath 8/10 - LAD 40-50% w mild PAH mean 29 w PVR 3.2 [place]  . PE (pulmonary embolism) ~ 2010    LLE  . Shortness of breath dyspnea   . Lung interstitial disease   . Hypoxemia   . COPD (chronic obstructive pulmonary disease)   . On home oxygen therapy     "5L; 24/7" (10/11/2014)  . OSA treated with BiPAP     "I think it's BiPAP"  . AAA (abdominal aortic aneurysm)     small  . History of blood transfusion   . Anemia   . Iron deficiency anemia   . History of blood transfusion     "related to nose bleed"  . Gout   . Depression   . Chronic kidney disease (CKD), stage II (mild)    Past Surgical History  Procedure Laterality Date  . Appendectomy  1947  . Tonsillectomy  1946  . Cardioversion  01/2007    x2  . Right heart catheterization N/A 02/28/2014    Procedure: RIGHT HEART CATH; Surgeon: Larey Dresser, MD; Location: [facility] CATH LAB; Service: Cardiovascular; Laterality: N/A;  . Cardiac catheterization  02/2014  . Cataract extraction w/ intraocular lens implant, bilateral Bilateral    Family History  Problem Relation Age of Onset  .  Heart failure Mother   . Depression Mother   . Ovarian cancer Paternal Grandmother   . Heart disease Paternal Grandfather   . Obesity Other    Social History:  reports that she has never smoked. She has never used smokeless tobacco. She  reports that she drinks alcohol. She reports that she does not use illicit drugs. Allergies:  Allergies  Allergen Reactions  . Pirfenidone Other (See Comments)    Congestion, breathing diffulty   Medications Prior to Admission  Medication Sig Dispense Refill  . albuterol (PROVENTIL) (2.5 MG/3ML) 0.083% nebulizer solution Take 3 mLs (2.5 mg total) by nebulization every 2 (two) hours as needed for wheezing or shortness of breath. 75 mL 12  . allopurinol (ZYLOPRIM) 300 MG tablet Take 1 tablet (300 mg total) by mouth daily. 30 tablet 11  . aspirin 81 MG tablet Take 81 mg by mouth daily.    [name] atorvastatin (LIPITOR) 10 MG tablet Take 1 tablet (10 mg total) by mouth daily. 30 tablet 5  . budesonide (PULMICORT) 0.25 MG/2ML nebulizer solution Take 0.25 mg by nebulization 2 (two) times daily.    . cetirizine (ZYRTEC ALLERGY) 10 MG tablet Take 10 mg by mouth daily.     . colchicine 0.6 MG tablet Take 0.6 mg by mouth as needed (for flareup).     [name] diltiazem (CARDIZEM CD) 240 MG 24 hr capsule Take 1 capsule (240 mg total) by mouth daily. 30 capsule 0  . escitalopram (LEXAPRO) 20 MG tablet Take 1 tablet (20 mg total) by mouth daily. 30 tablet 5  . famotidine (PEPCID) 20 MG tablet Take 1 tablet (20 mg total) by mouth daily. 30 tablet 6  . ferrous sulfate 325 (65 FE) MG tablet Take 1 tablet (325 mg total) by mouth 2 (two) times daily with a meal. 60 tablet 0  . furosemide (LASIX) 80 MG tablet Take 1 tablet (80 mg total) by mouth every 8 (eight) hours. 90 tablet 1  . gabapentin (NEURONTIN) 300 MG capsule Take 1 capsule (300 mg total) by mouth daily. 90 capsule 0  . Multiple Vitamins-Minerals (MULTIVITAMIN & MINERAL PO) Take 1 tablet by mouth daily.    [name] nystatin (MYCOSTATIN/NYSTOP) 100000 UNIT/GM POWD Apply to flank and groin and under breasts BID (Patient taking differently: Apply topically 2 (two) times daily as needed  (fungal areas). Apply to flank and groin and under breasts BID) 60 g 2  . Polyethyl Glycol-Propyl Glycol (SYSTANE OP) Place 1 drop into both eyes daily as needed (dry eyes).    . potassium chloride SA (K-DUR,KLOR-CON) 20 MEQ tablet Take 20 mEq by mouth daily.    . predniSONE (DELTASONE) 5 MG tablet Take 15 mg daily for one month Take 10 mg daily for one month Take 5 mg daily for one month (Patient taking differently: Take 15 mg by mouth daily with breakfast. Take 15 mg daily for one month Take 10 mg daily for one month Take 5 mg daily for one month) 180 tablet 0  . warfarin (COUMADIN) 2.5 MG tablet Take as directed. (Patient taking differently: Take 3.75 mg by mouth daily at 6 PM. Take as directed.) 90 tablet 0  . metolazone (ZAROXOLYN) 5 MG tablet Take 1 tablet (5 mg total) by mouth daily as needed. (Patient taking differently: Take 5 mg by mouth daily as needed (for fluid retention when weight reaches 295). ) 30 tablet 1  . OVER THE COUNTER MEDICATION Inhale 1 application into the lungs at bedtime. USES BIPAP EVERY BEDTIME  FOR SLEEP      Home: Home Living Family/patient expects to be discharged to:: Private residence Living Arrangements: Children, Non-relatives/Friends Available Help at Discharge: Family, Personal care attendant, Available 24 hours/day Type of Home: House Home Access: Stairs to enter [employer] of Steps: 2 Entrance Stairs-Rails: Left, Right, Can reach both Home Layout: One level Bathroom Shower/Tub: Multimedia programmer: Handicapped height Bathroom Accessibility: Yes Home Equipment: Civil engineer, contracting - built in, [employer] - tub/shower, Environmental consultant - 2 wheels, Transport chair, Bedside commode, Other (comment), Adaptive equipment Adaptive Equipment: Reacher, Sock aid, Long-handled shoe horn, Long-handled sponge Additional Comments: son and daughter in-law live with pt; has aide 5 hours daily x 5 days a week  Functional  History: Prior Function Level of Independence: Needs assistance Gait / Transfers Assistance Needed: Uses RW and has lift chair and elevated height bed.  ADL's / Homemaking Assistance Needed: (A) with cooking/cleaning; reports she can dress and bathe herself  Comments: pt reports incr difficulty ambulating to/from bathroom at home; very anxious and fearful of falling  Functional Status:  Mobility: Bed Mobility General bed mobility comments: up in chair Transfers Overall transfer level: Needs assistance Equipment used: Rolling walker (2 wheeled) Transfers: Sit to/from Stand Sit to Stand: Min guard General transfer comment: unsteady; min guard to balance; incr time due to weakness Ambulation/Gait Ambulation/Gait assistance: Min guard Ambulation Distance (Feet): 20 Feet Assistive device: Rolling walker (2 wheeled) Gait Pattern/deviations: Step-through pattern, Decreased stride length, Wide base of support, Shuffle General Gait Details: unsteady; intermittent c/o dizziness; min guard to (A) with RW management; O2 desat to 56% on 6L during ambulation; recoverd with 5 min recovery time in sitting to 90% Gait velocity: very decreased Gait velocity interpretation: Below normal speed for age/gender    ADL:    Cognition: Cognition Overall Cognitive Status: Within Functional Limits for tasks assessed Cognition Arousal/Alertness: Awake/alert Behavior During Therapy: Anxious Overall Cognitive Status: Within Functional Limits for tasks assessed  Physical Exam: Blood pressure 130/57, pulse 66, temperature 97.5 F (36.4 C), temperature source Axillary, resp. rate 19, height _0  (1.626 m), weight 131.2 kg (289 lb 3.9 oz), SpO2 92 %. Physical Exam Constitutional: She is oriented to person, place, and time.  81 year old right-handed obese female  HENT:  Head: Normocephalic.  Eyes: EOM are normal.  Neck: Normal range of motion. Neck supple. No thyromegaly present.   Cardiovascular:  Cardiac rate controlled  Respiratory:  Decreased breath sounds at the bases  GI: Soft. Bowel sounds are normal. She exhibits no distension.  Neurological: She is alert and oriented to person, place, and time. No cranial nerve deficit. Coordination normal.  Follows commands. UES 5/5 prox to distal. LES: 2/5 HF, 3/5 KE and 4/5 ankles. Decreased LT bilateral distal legs/feet.  Skin: Skin is warm and dry.  Multiple scars, wounds on both legs. Numerous bruises and ecchymoses on the UE's.  Psychiatric: She has a normal mood and affect. Her behavior is normal.  Stasis dermatitis bilateral lower extremities, healed extensive abrasions bilateral anterior leg right greater than the left    Lab Results Last 48 Hours    Results for orders placed or performed during the hospital encounter of 11/16/14 (from the past 48 hour(s))  Basic metabolic panel Status: Abnormal   Collection Time: 11/22/14 6:55 PM  Result Value Ref Range   Sodium 136 135 - 145 mmol/L   Potassium 5.7 (H) 3.5 - 5.1 mmol/L    Comment: DELTA CHECK NOTED HEMOLYSIS AT THIS LEVEL MAY AFFECT RESULT  Chloride 85 (L) 101 - 111 mmol/L   CO2 39 (H) 22 - 32 mmol/L   Glucose, Bld 273 (H) 65 - 99 mg/dL   BUN 70 (H) 6 - 20 mg/dL   Creatinine, Ser 1.83 (H) 0.44 - 1.00 mg/dL   Calcium 9.0 8.9 - 10.3 mg/dL   GFR calc non Af Amer 25 (L) >60 mL/min   GFR calc Af Amer 29 (L) >60 mL/min    Comment: (NOTE) The eGFR has been calculated using the CKD EPI equation. This calculation has not been validated in all clinical situations. eGFR's persistently <60 mL/min signify possible Chronic Kidney Disease.    Anion gap 12 5 - 15  Basic metabolic panel Status: Abnormal   Collection Time: 11/22/14 10:05 PM  Result Value Ref Range   Sodium 139 135 - 145 mmol/L   Potassium 3.6 3.5 - 5.1 mmol/L    Comment: DELTA CHECK NOTED   Chloride  86 (L) 101 - 111 mmol/L   CO2 41 (H) 22 - 32 mmol/L   Glucose, Bld 209 (H) 65 - 99 mg/dL   BUN 75 (H) 6 - 20 mg/dL   Creatinine, Ser 1.96 (H) 0.44 - 1.00 mg/dL   Calcium 9.4 8.9 - 10.3 mg/dL   GFR calc non Af Amer 23 (L) >60 mL/min   GFR calc Af Amer 26 (L) >60 mL/min    Comment: (NOTE) The eGFR has been calculated using the CKD EPI equation. This calculation has not been validated in all clinical situations. eGFR's persistently <60 mL/min signify possible Chronic Kidney Disease.    Anion gap 12 5 - 15  Protime-INR Status: Abnormal   Collection Time: 11/23/14 5:21 AM  Result Value Ref Range   Prothrombin Time 25.1 (H) 11.6 - 15.2 seconds   INR 2.30 (H) 0.00 - 1.49  Magnesium Status: Abnormal   Collection Time: 11/23/14 5:21 AM  Result Value Ref Range   Magnesium 2.6 (H) 1.7 - 2.4 mg/dL  Phosphorus Status: None   Collection Time: 11/23/14 5:21 AM  Result Value Ref Range   Phosphorus 4.5 2.5 - 4.6 mg/dL  CBC with Differential/Platelet Status: Abnormal   Collection Time: 11/23/14 5:21 AM  Result Value Ref Range   WBC 12.9 (H) 4.0 - 10.5 K/uL   RBC 4.48 3.87 - 5.11 MIL/uL   Hemoglobin 14.5 12.0 - 15.0 g/dL   HCT 44.9 36.0 - 46.0 %   MCV 100.2 (H) 78.0 - 100.0 fL   MCH 32.4 26.0 - 34.0 pg   MCHC 32.3 30.0 - 36.0 g/dL   RDW 16.5 (H) 11.5 - 15.5 %   Platelets 226 150 - 400 K/uL   Neutrophils Relative % 94 (H) 43 - 77 %   Neutro Abs 12.1 (H) 1.7 - 7.7 K/uL   Lymphocytes Relative 2 (L) 12 - 46 %   Lymphs Abs 0.3 (L) 0.7 - 4.0 K/uL   Monocytes Relative 4 3 - 12 %   Monocytes Absolute 0.5 0.1 - 1.0 K/uL   Eosinophils Relative 0 0 - 5 %   Eosinophils Absolute 0.0 0.0 - 0.7 K/uL   Basophils Relative 0 0 - 1 %   Basophils Absolute 0.0 0.0 - 0.1 K/uL  Basic metabolic panel Status: Abnormal   Collection  Time: 11/23/14 5:21 AM  Result Value Ref Range   Sodium 140 135 - 145 mmol/L   Potassium 3.2 (L) 3.5 - 5.1 mmol/L   Chloride 88 (L) 101 - 111 mmol/L   CO2 42 (H) 22 -  32 mmol/L   Glucose, Bld 161 (H) 65 - 99 mg/dL   BUN 70 (H) 6 - 20 mg/dL   Creatinine, Ser 1.62 (H) 0.44 - 1.00 mg/dL   Calcium 9.0 8.9 - 10.3 mg/dL   GFR calc non Af Amer 29 (L) >60 mL/min   GFR calc Af Amer 33 (L) >60 mL/min    Comment: (NOTE) The eGFR has been calculated using the CKD EPI equation. This calculation has not been validated in all clinical situations. eGFR's persistently <60 mL/min signify possible Chronic Kidney Disease.    Anion gap 10 5 - 15  Protime-INR Status: Abnormal   Collection Time: 11/24/14 5:17 AM  Result Value Ref Range   Prothrombin Time 22.3 (H) 11.6 - 15.2 seconds   INR 1.97 (H) 0.00 - 1.49  CBC with Differential/Platelet Status: Abnormal   Collection Time: 11/24/14 5:17 AM  Result Value Ref Range   WBC 15.0 (H) 4.0 - 10.5 K/uL   RBC 4.62 3.87 - 5.11 MIL/uL   Hemoglobin 15.0 12.0 - 15.0 g/dL   HCT 46.2 (H) 36.0 - 46.0 %   MCV 100.0 78.0 - 100.0 fL   MCH 32.5 26.0 - 34.0 pg   MCHC 32.5 30.0 - 36.0 g/dL   RDW 16.5 (H) 11.5 - 15.5 %   Platelets 258 150 - 400 K/uL   Neutrophils Relative % 94 (H) 43 - 77 %   Neutro Abs 14.2 (H) 1.7 - 7.7 K/uL   Lymphocytes Relative 4 (L) 12 - 46 %   Lymphs Abs 0.5 (L) 0.7 - 4.0 K/uL   Monocytes Relative 2 (L) 3 - 12 %   Monocytes Absolute 0.3 0.1 - 1.0 K/uL   Eosinophils Relative 0 0 - 5 %   Eosinophils Absolute 0.0 0.0 - 0.7 K/uL   Basophils Relative 0 0 - 1 %   Basophils Absolute 0.0 0.0 - 0.1 K/uL  Basic metabolic panel Status: Abnormal   Collection Time: 11/24/14 5:17 AM  Result Value Ref Range   Sodium 142 135 - 145 mmol/L   Potassium 3.6 3.5 - 5.1 mmol/L     Comment: SLIGHT HEMOLYSIS   Chloride 87 (L) 101 - 111 mmol/L   CO2 44 (H) 22 - 32 mmol/L   Glucose, Bld 166 (H) 65 - 99 mg/dL   BUN 73 (H) 6 - 20 mg/dL   Creatinine, Ser 1.61 (H) 0.44 - 1.00 mg/dL   Calcium 9.4 8.9 - 10.3 mg/dL   GFR calc non Af Amer 29 (L) >60 mL/min   GFR calc Af Amer 33 (L) >60 mL/min    Comment: (NOTE) The eGFR has been calculated using the CKD EPI equation. This calculation has not been validated in all clinical situations. eGFR's persistently <60 mL/min signify possible Chronic Kidney Disease.    Anion gap 11 5 - 15      Imaging Results (Last 48 hours)    Dg Chest Portable 1 View  11/24/2014 CLINICAL DATA: Respiratory failure EXAM: PORTABLE CHEST - 1 VIEW COMPARISON: Portable chest x-ray of November 22, 2014 FINDINGS: The lungs are reasonably well inflated. The interstitial markings are coarse and remain increased bilaterally. The left lower lobe is dense and there is obscuration of the left hemidiaphragm consistent with pleural fluid. The cardiac silhouette is enlarged. The pulmonary vascularity is prominent centrally. There is pleural fluid versus pleural thickening over the superior lateral aspects of both lungs. The bony thorax exhibits no acute abnormality. IMPRESSION: There has not been significant change since yesterday's  study. CHF with small bilateral pleural effusions and interstitial edema and/or bilateral interstitial pneumonia persists. Electronically Signed By: David Martinique M.D. On: 11/24/2014 07:42        Medical Problem List and Plan: 1. Functional deficits secondary to debilitation related to acute on chronic respiratory failure with pulmonary fibrosis with chronic oxygen therapy/Solu-Medrol per pulmonary services intravenous for now (patient was on a prednisone taper to off over 3 months prior to admission) 2. DVT Prophylaxis/Anticoagulation: History of DVT,Chronic Coumadin therapy. Monitor for  rebleeding episodes 3. Pain Management: Neurontin 300 mg daily. 4. Mood/depression: Lexapro 20 mg daily. Provide emotional support 5. Neuropsych: This patient is capable of making decisions on her own behalf. 6. Skin/Wound Care: Routine skin checks 7. Fluids/Electrolytes/Nutrition: Routine I&O with follow-up chemistries 8. Hypertension/atrial fibrillation. Cardizem 240 mg daily, Zaroxolyn 5 mg daily 9. Diastolic congestive heart failure. Continue Lasix as directed 40 mg 3 times a day and monitor for any signs of fluid overload as well as monitoring renal function 10. MRSA PCR screening positive. Contact precautions 11. Hyperlipidemia. Lipitor 12. Chronic renal insufficiency. Baseline creatinine 1.19-1.61. Follow-up chemistries   Post Admission Physician Evaluation: 1. Functional deficits secondary to debilitation related to acute on chronic respiratory failure with pulmonary fibrosis with chronic oxygen therapy. 2. Patient is admitted to receive collaborative, interdisciplinary care between the physiatrist, rehab nursing staff, and therapy team. 3. Patient's level of medical complexity and substantial therapy needs in context of that medical necessity cannot be provided at a lesser intensity of care such as a SNF. 4. Patient has experienced substantial functional loss from his/her baseline which was documented above under the "Functional History" and "Functional Status" headings. Judging by the patient's diagnosis, physical exam, and functional history, the patient has potential for functional progress which will result in measurable gains while on inpatient rehab. These gains will be of substantial and practical use upon discharge in facilitating mobility and self-care at the household level. 5. Physiatrist will provide 24 hour management of medical needs as well as oversight of the therapy plan/treatment and provide guidance as appropriate regarding the interaction of the two. 6. 24 hour  rehab nursing will assist with bladder management, bowel management, safety, skin/wound care, disease management, medication administration, pain management and patient education and help integrate therapy concepts, techniques,education, etc. 7. PT will assess and treat for/with: pre gait, gait training, endurance , safety, equipment, neuromuscular re education 8. . Goals are: Mod I. 9. OT will assess and treat for/with: ADLs, Cognitive perceptual skills, Neuromuscular re education, safety, endurance, equipment. Goals are: Mod I. Therapy may proceed with showering this patient. 10. SLP will assess and treat for/with: NA. Goals are: NA. 11. Case Management and Social Worker will assess and treat for psychological issues and discharge planning. 12. Team conference will be held weekly to assess progress toward goals and to determine barriers to discharge. 13. Patient will receive at least 3 hours of therapy per day at least 5 days per week. 14. ELOS: 5-7d  15. Prognosis: good     Charlett Blake M.D. [facility] FAAPM&R (Sports Med, Neuromuscular Med) Diplomate Am Board of Electrodiagnostic Med  11/24/2014

## 2014-11-25 NOTE — Telephone Encounter (Signed)
Called patient on home number.  A gentleman answered and stated that [name] [name] was not available, she is still in the hospital.  He disconnected the call before I could get any further information.  Will attempt to recall later.

## 2014-11-25 NOTE — Discharge Instructions (Addendum)
Information on my medicine - Coumadin   (Warfarin)  This medication education was reviewed with me or my healthcare representative as part of my discharge preparation.  Th  Why was Coumadin prescribed for you? Coumadin was prescribed for you because you have a blood clot or a medical condition that can cause an increased risk of forming blood clots. Blood clots can cause serious health problems by blocking the flow of blood to the heart, lung, or brain. Coumadin can prevent harmful blood clots from forming. As a reminder your indication for Coumadin is:   Hx of atrial fibrillation and deep vein thrombosis  What test will check on my response to Coumadin? While on Coumadin (warfarin) you will need to have an INR test regularly to ensure that your dose is keeping you in the desired range. The INR (international normalized ratio) number is calculated from the result of the laboratory test called prothrombin time (PT).  If an INR APPOINTMENT HAS NOT ALREADY BEEN MADE FOR YOU please schedule an appointment to have this lab work done by your health care provider within 7 days. Your INR goal is 1.8 - 2.2  What  do you need to  know  About  COUMADIN? Take Coumadin (warfarin) exactly as prescribed by your healthcare provider about the same time each day.  DO NOT stop taking without talking to the doctor who prescribed the medication.  Stopping without other blood clot prevention medication to take the place of Coumadin may increase your risk of developing a new clot or stroke.  Get refills before you run out.  What do you do if you miss a dose? If you miss a dose, take it as soon as you remember on the same day then continue your regularly scheduled regimen the next day.  Do not take two doses of Coumadin at the same time.  Important Safety Information A possible side effect of Coumadin (Warfarin) is an increased risk of bleeding. You should call your healthcare provider right away if you experience any  of the following: ? Bleeding from an injury or your nose that does not stop. ? Unusual colored urine (red or dark brown) or unusual colored stools (red or black). ? Unusual bruising for unknown reasons. ? A serious fall or if you hit your head (even if there is no bleeding).  Some foods or medicines interact with Coumadin (warfarin) and might alter your response to warfarin. To help avoid this: ? Eat a balanced diet, maintaining a consistent amount of Vitamin K. ? Notify your provider about major diet changes you plan to make. ? Avoid alcohol or limit your intake to 1 drink for women and 2 drinks for men per day. (1 drink is 5 oz. wine, 12 oz. beer, or 1.5 oz. liquor.)  Make sure that ANY health care provider who prescribes medication for you knows that you are taking Coumadin (warfarin).  Also make sure the healthcare provider who is monitoring your Coumadin knows when you have started a new medication including herbals and non-prescription products.  Coumadin (Warfarin)  Major Drug Interactions  Increased Warfarin Effect Decreased Warfarin Effect  Alcohol (large quantities) Antibiotics (esp. Septra/Bactrim, Flagyl, Cipro) Amiodarone (Cordarone) Aspirin (ASA) Cimetidine (Tagamet) Megestrol (Megace) NSAIDs (ibuprofen, naproxen, etc.) Piroxicam (Feldene) Propafenone (Rythmol SR) Propranolol (Inderal) Isoniazid (INH) Posaconazole (Noxafil) Barbiturates (Phenobarbital) Carbamazepine (Tegretol) Chlordiazepoxide (Librium) Cholestyramine (Questran) Griseofulvin Oral Contraceptives Rifampin Sucralfate (Carafate) Vitamin K   Coumadin (Warfarin) Major Herbal Interactions  Increased Warfarin Effect Decreased Warfarin Effect  Garlic  Ginseng Ginkgo biloba Coenzyme Q10 Green tea St. Johns wort    Coumadin (Warfarin) FOOD Interactions  Eat a consistent number of servings per week of foods HIGH in Vitamin K (1 serving =  cup)  Collards (cooked, or boiled & drained) Kale  (cooked, or boiled & drained) Mustard greens (cooked, or boiled & drained) Parsley *serving size only =  cup Spinach (cooked, or boiled & drained) Swiss chard (cooked, or boiled & drained) Turnip greens (cooked, or boiled & drained)  Eat a consistent number of servings per week of foods MEDIUM-HIGH in Vitamin K (1 serving = 1 cup)  Asparagus (cooked, or boiled & drained) Broccoli (cooked, boiled & drained, or raw & chopped) Brussel sprouts (cooked, or boiled & drained) *serving size only =  cup Lettuce, raw (green leaf, endive, romaine) Spinach, raw Turnip greens, raw & chopped   These websites have more information on Coumadin (warfarin):  http://www.king-russell.com/; https://www.hines.net/;  Inpatient Rehab Discharge Instructions  MAELYN BERREY Discharge date and time: No discharge date for patient encounter.   Activities/Precautions/ Functional Status: Activity: activity as tolerated Diet: regular diet Wound Care: none needed Functional status:  ___ No restrictions     ___ Walk up steps independently ___ 24/7 supervision/assistance   ___ Walk up steps with assistance ___ Intermittent supervision/assistance  ___ Bathe/dress independently ___ Walk with walker     ___ Bathe/dress with assistance ___ Walk Independently    ___ Shower independently _x__ Walk with assistance    ___ Shower with assistance ___ No alcohol     ___ Return to work/school ________    COMMUNITY REFERRALS UPON DISCHARGE:    Home Health:   PT       RN                        Agency:  [facility] Phone: [phone]   Medical Equipment/Items Ordered: wheelchair, cushion                                                     Agency/Supplier: [facility] @ [phone]      Special Instructions: Continue chronic oxygen as prior to admission   Home health nurse to check INR on Tuesday, 12/06/2014 results to [facility] Coumadin clinic [phone] fax number [phone]  Lasix 40 mg twice daily 1  week and follow-up with primary doctor to determine continuous dose  FOLLOW UP LABS HOME HEALTH NURSE 12/05/2017 TO CHECK POTASSIUM LEVEL   My questions have been answered and I understand these instructions. I will adhere to these goals and the provided educational materials after my discharge from the hospital.  Patient/Caregiver Signature _______________________________ Date __________  Clinician Signature _______________________________________ Date __________  Please bring this form and your medication list with you to all your follow-up doctor's appointments.

## 2014-11-25 NOTE — Progress Notes (Signed)
Pt placed on Auto BIPAP 7-14 CMH20 via FFM with 5 LPM O2 bleed in.  Pt tolerating well at this time, RT to monitor and assess as needed.

## 2014-11-25 NOTE — Progress Notes (Signed)
Occupational Therapy Assessment and Plan  Patient Details  Name: [name] [name] MRN: [id number] Date of Birth: 07-20-1931  OT Diagnosis: muscle weakness (generalized) Rehab Potential: Rehab Potential: Good ELOS: 7 days   Today's Date: 11/25/2014 OT Individual Time: 1030-1200 OT Individual Time Calculation (min): 90 min     Problem List:  Patient Active Problem List   Diagnosis Date Noted  . Debilitated 11/24/2014  . Acute respiratory failure with hypoxia 11/16/2014  . Pulmonary edema   . Pulmonary fibrosis   . Chronic anticoagulation 10/13/2014  . Hypoxia 10/11/2014  . Steroid toxicity 10/05/2014  . Leg swelling 10/04/2014  . AKI (acute kidney injury)   . Acute on chronic respiratory failure 08/02/2014  . History of DVT (deep vein thrombosis) 08/01/2014  . CAP (community acquired pneumonia) 08/01/2014  . SOB (shortness of breath) 08/01/2014  . Interstitial lung disease 08/01/2014  . Acute bronchitis 04/26/2014  . Counseling regarding end of life decision making 04/12/2014  . ILD (interstitial lung disease) 04/05/2014  . Encounter for anticoagulation discussion and counseling 04/05/2014  . Acute on chronic respiratory failure with hypoxemia 02/21/2014  . Non-compliant behavior 02/18/2014  . CHF exacerbation 02/18/2014  . Hx pulmonary embolism 02/18/2014  . Peripheral neuropathy 09/17/2013  . Acute-on-chronic respiratory failure 08/31/2013  . Acute on chronic diastolic heart failure 85/92/9244  . Hypokalemia 08/24/2013  . Encounter for therapeutic drug monitoring 07/28/2013  . CKD (chronic kidney disease) stage 3, GFR 30-59 ml/min 06/24/2013  . Iron deficiency anemia 06/24/2013  . Chronic hypoxemic respiratory failure 09/29/2012  . Wound of lower extremity due to hematoma 08/16/2012  . Gout, chronic 02/12/2012  . Pure hypercholesterolemia 07/09/2010  . SLEEP RELATED HYPOVENTILATION/HYPOXEMIA CCE 07/03/2010  . OBESITY-MORBID (>100') 04/04/2009  . DIASTOLIC HEART FAILURE,  CHRONIC 02/02/2009  . Abdominal aortic aneurysm 12/23/2008  . POSTHERPETIC NEURALGIA 03/09/2008  . Allergic rhinitis 12/02/2007  . DEPRESSION, ACUTE 06/03/2007  . MENINGITIS, HX OF 11/26/2006  . SHINGLES, HX OF 11/26/2006  . Essential hypertension 11/25/2006  . EMBOLISM & INFARCTION, IATROGENIC PULMONARY 11/25/2006  . PAF (paroxysmal atrial fibrillation) 11/25/2006  . THROMBOSIS, VENOUS NOS 11/25/2006  . Obstructive sleep apnea 11/25/2006    Past Medical History:  Past Medical History  Diagnosis Date  . Congestive heart failure, unspecified     a. ECHO 6/0: EF 55-60%, mild LVH, mildly dilated RV w. mildly dec fx, RVSP 67  . Chronic atrial fibrillation     failed cardioversion in past  -repeat DC-CV on October 5th, 2010  . Personal history of DVT (deep vein thrombosis) 2008    LLE  . HTN (hypertension)   . Morbid obesity   . Shingles     with postherpetic neuraigia  . GERD (gastroesophageal reflux disease)   . Hyperlipidemia   . Meningitis ~ 1960    hospitalized @ [facility]  . Gallstones     s/p cholecystectomy  . CAD (coronary artery disease)     nonobstructive by cath 8/10 - LAD 40-50% w mild PAH mean 29 w PVR 3.2 [place]  . PE (pulmonary embolism) ~ 2010    LLE  . Shortness of breath dyspnea   . Lung interstitial disease   . Hypoxemia   . COPD (chronic obstructive pulmonary disease)   . On home oxygen therapy     "5L; 24/7" (10/11/2014)  . OSA treated with BiPAP     "I think it's BiPAP"  . AAA (abdominal aortic aneurysm)     small  . History of blood transfusion   .  Anemia   . Iron deficiency anemia   . History of blood transfusion     "related to nose bleed"  . Gout   . Depression   . Chronic kidney disease (CKD), stage II (mild)    Past Surgical History:  Past Surgical History  Procedure Laterality Date  . Appendectomy  1947  . Tonsillectomy  1946  . Cardioversion  01/2007    x2  . Right heart catheterization N/A 02/28/2014    Procedure: RIGHT HEART  CATH;  Surgeon: Larey Dresser, MD;  Location: [facility] CATH LAB;  Service: Cardiovascular;  Laterality: N/A;  . Cardiac catheterization  02/2014  . Cataract extraction w/ intraocular lens  implant, bilateral Bilateral     Assessment & Plan Clinical Impression: Patient is a 81 y/o right handed female with history of chronic renal insufficiency with creatinine 1.19, pulmonary fibrosis with home oxygen of 5 L, diastolic congestive heart failure, chronic atrial fibrillation maintained on Coumadin therapy. Patient lives with her son and also home health aide 5 hours a day 5 days a week. She used a walker to help aid in mobility. Presented 11/16/2014 with increasing shortness of breath, fever ,cough. Chest x-ray with no acute abnormalities no evidence of pulmonary edema. Appearance consistent with chronic interstitial lung disease. Pulmonary service follow-up suspect acute viral pneumonitis placed on Solu-Medrol taper continues with diuresis. Echocardiogram with ejection fraction of 54% normal systolic function. Chronic Coumadin ongoing. Contact precautions for MRSA PCR screening positive.    Patient transferred to CIR on 11/24/2014.    Patient currently requires max assist with basic self-care skills secondary to decreased cardiorespiratoy endurance.  Prior to hospitalization, patient could complete BADL with mod assist  Patient will benefit from skilled intervention to decrease level of assist with basic self-care skills prior to discharge home with care partner.  Anticipate patient will require moderate physical assestance and no further OT follow recommended. OT - End of Session Endurance Deficit: Yes Endurance Deficit Description: 20' ambulation SpO2 drops to 85% on 6L O2 OT Assessment Rehab Potential (ACUTE ONLY): Good OT Patient demonstrates impairments in the following area(s): Safety;Endurance OT Basic ADL's Functional Problem(s): Bathing;Dressing;Toileting OT Transfers Functional Problem(s):  Toilet;Tub/Shower OT Plan OT Intensity: Minimum of 1-2 x/day, 45 to 90 minutes OT Frequency: 5 out of 7 days OT Duration/Estimated Length of Stay: 7 days OT Treatment/Interventions: Therapeutic Exercise;Therapeutic Activities;Self Care/advanced ADL retraining;Discharge planning;DME/adaptive equipment instruction;UE/LE Strength taining/ROM;Patient/family education;Skin care/wound managment;Functional mobility training;UE/LE Coordination activities;Disease mangement/prevention OT Self Feeding Anticipated Outcome(s): Independent OT Basic Self-Care Anticipated Outcome(s): Mod Assist OT Toileting Anticipated Outcome(s): Mod I OT Bathroom Transfers Anticipated Outcome(s): Supervision OT Recommendation Recommendations for Other Services: Other (comment) (Chest PT if appropriate) Patient destination: Home Follow Up Recommendations: None Equipment Recommended: To be determined  Skilled Therapeutic Intervention OT initial evaluation completed with treatment provided to focus on pt education on goals/methods of treatment, performance of assisted self-care, endurance, dynamic sitting/standing balance, transfers, and effective use of DME.   Pt able to rise to RW and ambulate to bathroom with steadying assist to stand from recliner and min guard during functional mobility.   Pt able to transfer to toilet with steadying assist and complete clothing management and hygiene with supervision for safety.   02 sats monitored at 5 L, during all tasks.   Pt 02 dropped to 86% during mobility but recovered to 91% which enabled pt to continue session with multiple rests breaks to conserve energy.  Pt required overall max assist during bathing but participated in upper  body bathing with good effort and attention.   Pt remained in gowns d/t no public clothing available.     Pt preferred returning to recliner at end of session with all needs within reach.  OT Evaluation Precautions/Restrictions  Precautions Precautions:  Fall Precaution Comments: monitor O2 sats Restrictions Weight Bearing Restrictions: No  General Chart Reviewed: Yes Family/Caregiver Present: No  Vital Signs Therapy Vitals Pulse Rate: 85 Oxygen Therapy SpO2: (!) 85 % (with ambulation) O2 Device: Nasal Cannula O2 Flow Rate (L/min): 6 L/min  Pain Pain Assessment Pain Assessment: No/denies pain Pain Score: 5  Pain Type: Chronic pain Pain Location: Flank Pain Orientation: Right Pain Descriptors / Indicators: Nagging Pain Intervention(s): Rest;Repositioned  Home Living/Prior Functioning Home Living Family/patient expects to be discharged to:: Private residence Living Arrangements: Children Available Help at Discharge: Family, Available 24 hours/day Type of Home: House Home Access: Stairs to enter Technical brewer of Steps: 2 Entrance Stairs-Rails: Right, Left Home Layout: One level Bathroom Shower/Tub: Multimedia programmer: Handicapped height Bathroom Accessibility: Yes Additional Comments: son, Gerald Stabs, lives with pt. two other sons local very supportive; has bed that elevates, lift chair, RW w/2 wheels, BSC, transport chair  Lives With: Son Gerald Stabs) IADL History Homemaking Responsibilities: Yes Meal Prep Responsibility: Secondary (Reheats food prepared for her) Laundry Responsibility: No Cleaning Responsibility: No Bill Paying/Finance Responsibility: Primary Shopping Responsibility: No Child Care Responsibility: No Current License: Yes Education: HS + 1 year college Type of Occupation: Worked 10 years and stopped to raise family Leisure and Hobbies: Crosswords puzzles, Brewing technologist Prior Function Level of Independence: Needs assistance with ADLs, Requires assistive device for independence, Independent with transfers, Needs assistance with homemaking, Independent with gait Bath: Maximal Dressing: Maximal Grooming: Minimal Meal Prep: Maximal Laundry: Total Vacuuming: Total Light  Housekeeping: Total Shopping: Total  Able to Take Stairs?: Yes Driving: No Leisure: Hobbies-yes (Comment) Comments: reports independent with short distance ambulation in the home, but usually someone there when walking to bathroom. since October has needed rest break on way to bathroom, but prior was able to walk all the way without rest  ADL ADL ADL Comments: see FIM  Vision/Perception  Vision- History Baseline Vision/History: Wears glasses Wears Glasses: At all times Patient Visual Report: No change from baseline   Cognition Overall Cognitive Status: Within Functional Limits for tasks assessed Arousal/Alertness: Awake/alert Orientation Level: Person;Place;Situation Person: Oriented Place: Oriented Situation: Oriented Year: 2016 Month: July Day of Week: Correct Memory: Appears intact Immediate Memory Recall: Sock;Blue;Bed Memory Recall: Sock;Blue;Bed Memory Recall Sock: Without Cue Memory Recall Blue: Without Cue Memory Recall Bed: Without Cue Attention: Selective Selective Attention: Appears intact Awareness: Appears intact Problem Solving: Appears intact Safety/Judgment: Appears intact Comments: Very talkative, sharing info about family during rest breaks  Sensation Sensation Light Touch: Appears Intact Stereognosis: Not tested Hot/Cold: Not tested Proprioception: Not tested Coordination Gross Motor Movements are Fluid and Coordinated: Not tested  Motor  Motor Motor: Abnormal postural alignment and control Motor - Skilled Clinical Observations: kyphotic with flexed posture in standing  Mobility  Bed Mobility Bed Mobility: Sit to Supine;Supine to Sit Supine to Sit: 5: Supervision;HOB elevated Supine to Sit Details (indicate cue type and reason): increased time and moved each leg independently into bed having to readjust once in they were on the bed Sit to Supine: 5: Supervision;HOB flat Sit to Supine - Details (indicate cue type and reason): elevated HOB  for improved aeration  Transfers Sit to Stand: 4: Min assist Sit to Stand Details (indicate cue type and  reason): cues for scooting to edge of chair/bed Stand to Sit: 4: Min guard Stand to Sit Details: cues for feeling seat with back of legs and reaching for seat/armrest   Trunk/Postural Assessment  Cervical Assessment Cervical Assessment: Exceptions to [facility] (forward head posture) Thoracic Assessment Thoracic Assessment: Exceptions to [facility] (kyphotic posture) Lumbar Assessment Lumbar Assessment: Within Functional Limits Postural Control Postural Control: Deficits on evaluation Postural Limitations: due to generalized weakness   Balance Balance Balance Assessed: Yes Static Standing Balance Static Standing - Balance Support: Bilateral upper extremity supported Static Standing - Level of Assistance: 5: Stand by assistance Dynamic Standing Balance Dynamic Standing - Balance Support: Left upper extremity supported;During functional activity Dynamic Standing - Level of Assistance: 4: Min assist Dynamic Standing - Balance Activities: Reaching for objects  Extremity/Trunk Assessment RUE Assessment RUE Assessment: Exceptions to [facility] RUE Strength RUE Overall Strength: Deficits (2+/5) RUE Overall Strength Comments: 3/5 Right Shoulder Flexion: 2+/5 LUE Assessment LUE Assessment: Exceptions to [facility] LUE Strength LUE Overall Strength:  (2+/5)  FIM:  FIM - Eating Eating Activity: 7: Complete independence:no helper FIM - Grooming Grooming Steps: Wash, rinse, dry face;Wash, rinse, dry hands;Oral care, brush teeth, clean dentures Grooming: 4: Patient completes 3 of 4 or 4 of 5 steps FIM - Bathing Bathing Steps Patient Completed: Chest;Abdomen;Front perineal area;Right upper leg;Left upper leg Bathing: 2: Max-Patient completes 3-4 34f 10 parts or 25-49% FIM - Upper Body Dressing/Undressing Upper body dressing/undressing: 0: Wears gown/pajamas-no public clothing FIM - Lower Body  Dressing/Undressing Lower body dressing/undressing: 0: Wears gown/pajamas-no public clothing FIM - Toileting Toileting steps completed by patient: Adjust clothing prior to toileting;Performs perineal hygiene;Adjust clothing after toileting Toileting: 5: Supervision: Safety issues/verbal cues FIM - IT sales professional Transfer Assistive Devices: Adult nurse Transfer: 5: Bed > Chair or W/C: Supervision (verbal cues/safety issues) FIM - Radio producer Devices: Mining engineer Transfers: 4-To toilet/BSC: Min A (steadying Pt. > 75%);4-From toilet/BSC: Min A (steadying Pt. > 75%) FIM - Tub/Shower Transfers Tub/Shower Assistive Devices: Walk in shower;Tub transfer bench;Grab bars Tub/shower Transfers: 4-Into Tub/Shower: Min A (steadying Pt. > 75%/lift 1 leg);4-Out of Tub/Shower: Min A (steadying Pt. > 75%/lift 1 leg)   Refer to Care Plan for Long Term Goals  Recommendations for other services: Other: Chest PT if appropriate  Discharge Criteria: Patient will be discharged from OT if patient refuses treatment 3 consecutive times without medical reason, if treatment goals not met, if there is a change in medical status, if patient makes no progress towards goals or if patient is discharged from hospital.  The above assessment, treatment plan, treatment alternatives and goals were discussed and mutually agreed upon: by patient  [facility] 11/25/2014, 3:41 PM

## 2014-11-25 NOTE — Progress Notes (Signed)
Occupational Therapy Session Note  Patient Details  Name: Aneta Minseggy T Schumacher MRN: [id number] Date of Birth: 10/13/1931  Today's Date: 11/25/2014 OT Individual Time: 1335-1430 OT Individual Time Calculation (min): 55 min    Short Term Goals: Week 1:  OT Short Term Goal 1 (Week 1): STG=LTG d/t short LOS  Skilled Therapeutic Interventions/Progress Updates:  Engaged in therapeutic activity with focus on BUE strengthening and activity tolerance.  Completed 3 reps of 10 with level 2 theraband with chest pulls and diagonals.  Pt required prolonged rest break after each set of 10 with O2 dropping to 90-91 on 5L after each set.  Noted decreased strength in LUE, encouraging increased attention to LUE.  2# dowel rod in sitting with chest presses 3 sets of 10.  Pt passed off to RN to complete scheduled meds.  Therapy Documentation Precautions:  Precautions Precautions: Fall Precaution Comments: monitor O2 sats Restrictions Weight Bearing Restrictions: No General:   Vital Signs: Therapy Vitals Temp: 98.1 F (36.7 C) Temp Source: Oral Pulse Rate: 75 Resp: 18 BP: 121/63 mmHg Patient Position (if appropriate): Sitting Oxygen Therapy SpO2: 92 % O2 Device: Nasal Cannula O2 Flow Rate (L/min): 5 L/min Pain: Pain Assessment Pain Score: 5  ADL: ADL ADL Comments: see FIM  See FIM for current functional status  Therapy/Group: Individual Therapy  Rosalio LoudHOXIE, Arizbeth Cawthorn 11/25/2014, 3:52 PM

## 2014-11-25 NOTE — Interval H&P Note (Signed)
Aneta Minseggy T Hafner was admitted yesterday to Inpatient Rehabilitation with the diagnosis of debility after acute on chronic respiratory failure.  The patient's history has been reviewed, patient examined, and there is no change in status.  Patient continues to be appropriate for intensive inpatient rehabilitation.  I have reviewed the patient's chart and labs.  Questions were answered to the patient's satisfaction. The PAPE has been reviewed and assessment remains appropriate.  SWARTZ,ZACHARY T 11/25/2014, 10:25 AM

## 2014-11-26 ENCOUNTER — Inpatient Hospital Stay (HOSPITAL_COMMUNITY): Payer: Medicare Other | Admitting: Occupational Therapy

## 2014-11-26 ENCOUNTER — Inpatient Hospital Stay (HOSPITAL_COMMUNITY): Payer: Medicare Other

## 2014-11-26 ENCOUNTER — Inpatient Hospital Stay (HOSPITAL_COMMUNITY): Payer: Medicare Other | Admitting: Physical Therapy

## 2014-11-26 DIAGNOSIS — R5381 Other malaise: Secondary | ICD-10-CM

## 2014-11-26 LAB — PROTIME-INR
INR: 2.55 — AB (ref 0.00–1.49)
Prothrombin Time: 27.1 seconds — ABNORMAL HIGH (ref 11.6–15.2)

## 2014-11-26 MED ORDER — METHYLPREDNISOLONE SODIUM SUCC 40 MG IJ SOLR
40.0000 mg | Freq: Once | INTRAMUSCULAR | Status: AC
  Administered 2014-11-26: 40 mg via INTRAVENOUS
  Filled 2014-11-26: qty 1

## 2014-11-26 MED ORDER — METHYLPREDNISOLONE SODIUM SUCC 40 MG IJ SOLR
40.0000 mg | Freq: Two times a day (BID) | INTRAMUSCULAR | Status: DC
  Administered 2014-11-26 – 2014-11-30 (×10): 40 mg via INTRAVENOUS
  Filled 2014-11-26 (×12): qty 1

## 2014-11-26 MED ORDER — WARFARIN 0.5 MG HALF TABLET
0.5000 mg | ORAL_TABLET | Freq: Once | ORAL | Status: AC
  Administered 2014-11-26: 0.5 mg via ORAL
  Filled 2014-11-26: qty 1

## 2014-11-26 NOTE — Progress Notes (Signed)
Physical Therapy Session Note  Patient Details  Name: [name] [name] MRN: [id number] Date of Birth: 03/30/1932  Today's Date: 11/26/2014 PT Individual Time: 1000-1100 PT Individual Time Calculation (min): 60 min   Short Term Goals: Week 1:  PT Short Term Goal 1 (Week 1): STG=LTG due to ELOS  Skilled Therapeutic Interventions/Progress Updates:  Pt was seen bedside in the am. Pt connected to 6 liter portable O2. Pt transferred sit to stand with rolling walker and min A. Pt ambulated about 10 feet with rolling walker and min A, O2 dropped to 82%, required 60 seconds to recover to greater than 90%. Pt propelled w/c towards gym about 75 feet with B UEs and several rest breaks, pt required occasional min A and verbal cues. Pt performed multiple sit to stand transfers with S to min A with rolling walker, switched out cushion to increase seat heaight and assist with decreasing level of assistance required for transfers.  In gym pt performed cone taps 3 sets x 5 reps each for LE strengthening. O2 sats dropped to 82% with minimal activity required between 60 to 120 seconds to recover to greater than 90% depending on level of fatigue. Pt returned to room and left sitting up in w/c with tech at bedside.   Therapy Documentation Precautions:  Precautions Precautions: Fall Precaution Comments: monitor O2 sats Restrictions Weight Bearing Restrictions: No General:   Pain: No c/o pain.    Locomotion : Ambulation Ambulation/Gait Assistance: 4: Min assist   See FIM for current functional status  Therapy/Group: Individual Therapy  Rayford HalstedMitchell, Lucas Exline G 11/26/2014, 12:43 PM

## 2014-11-26 NOTE — Progress Notes (Signed)
Occupational Therapy Session Note  Patient Details  Name: [name] [name] MRN: [id number] Date of Birth: 06/29/1931  Today's Date: 11/26/2014 OT Individual Time: 1300-1415 OT Individual Time Calculation (min): 75 min    Short Term Goals: Week 1:  OT Short Term Goal 1 (Week 1): STG=LTG d/t short LOS  Skilled Therapeutic Interventions/Progress Updates:    Pt seen for 1:1 OT session with focus on BUE strength, functional endurance, standing balance, and functional transfers. Pt received sitting in recliner chair. Ambulated apprx 5' with RW and min A to sit in w/c. Pt engaged in BUE strengthening exercises using Level 2 theraband. Pt completed chest pull, diagonal pulls, and bicep curl 1 set x10 reps with rest breaks between each. Completed shoulder press, chest press, and bicep curl 10 reps each using 1# weighted ball with rest breaks between each exercise. Pt on 6L O2 and SpO2>90 throughout therapeutic exercise. Pt propelled self apprx 40' in w/c with 3 rest breaks with focus on functional endurance. Engaged in plant watering activity in standing 3x for 1 min each trial with steadying assist. SpO2 dropped to 80% during activity and returned to 90-92% within 60 seconds. Pt returned to room and transferred back to recliner with SBA using RW. Pt left sitting in w/c with all needs in reach.  Therapy Documentation Precautions:  Precautions Precautions: Fall Precaution Comments: monitor O2 sats Restrictions Weight Bearing Restrictions: No General:   Vital Signs: Oxygen Therapy SpO2: 92 % O2 Device: Nasal Cannula O2 Flow Rate (L/min): 5 L/min Pain: No report of pain  Other Treatments:    See FIM for current functional status  Therapy/Group: Individual Therapy  Daneil Danerkinson, Ausha Sieh N 11/26/2014, 2:27 PM

## 2014-11-26 NOTE — Progress Notes (Signed)
Social Work  Social Work Assessment and Plan  Patient Details  Name: [name] [name] MRN: [id number] Date of Birth: [birth year]  Today's Date: 11/26/2014  Problem List:  Patient Active Problem List   Diagnosis Date Noted  . Debilitated 11/24/2014  . Acute respiratory failure with hypoxia 11/16/2014  . Pulmonary edema   . Pulmonary fibrosis   . Chronic anticoagulation 10/13/2014  . Hypoxia 10/11/2014  . Steroid toxicity 10/05/2014  . Leg swelling 10/04/2014  . AKI (acute kidney injury)   . Acute on chronic respiratory failure 08/02/2014  . History of DVT (deep vein thrombosis) 08/01/2014  . CAP (community acquired pneumonia) 08/01/2014  . SOB (shortness of breath) 08/01/2014  . Interstitial lung disease 08/01/2014  . Acute bronchitis 04/26/2014  . Counseling regarding end of life decision making 04/12/2014  . ILD (interstitial lung disease) 04/05/2014  . Encounter for anticoagulation discussion and counseling 04/05/2014  . Acute on chronic respiratory failure with hypoxemia 02/21/2014  . Non-compliant behavior 02/18/2014  . CHF exacerbation 02/18/2014  . Hx pulmonary embolism 02/18/2014  . Peripheral neuropathy 09/17/2013  . Acute-on-chronic respiratory failure 08/31/2013  . Acute on chronic diastolic heart failure 08/31/2013  . Hypokalemia 08/24/2013  . Encounter for therapeutic drug monitoring 07/28/2013  . CKD (chronic kidney disease) stage 3, GFR 30-59 ml/min 06/24/2013  . Iron deficiency anemia 06/24/2013  . Chronic hypoxemic respiratory failure 09/29/2012  . Wound of lower extremity due to hematoma 08/16/2012  . Gout, chronic 02/12/2012  . Pure hypercholesterolemia 07/09/2010  . SLEEP RELATED HYPOVENTILATION/HYPOXEMIA CCE 07/03/2010  . OBESITY-MORBID (>100') 04/04/2009  . DIASTOLIC HEART FAILURE, CHRONIC 02/02/2009  . Abdominal aortic aneurysm 12/23/2008  . POSTHERPETIC NEURALGIA 03/09/2008  . Allergic rhinitis 12/02/2007  . DEPRESSION, ACUTE 06/03/2007  .  MENINGITIS, HX OF 11/26/2006  . SHINGLES, HX OF 11/26/2006  . Essential hypertension 11/25/2006  . EMBOLISM & INFARCTION, IATROGENIC PULMONARY 11/25/2006  . PAF (paroxysmal atrial fibrillation) 11/25/2006  . THROMBOSIS, VENOUS NOS 11/25/2006  . Obstructive sleep apnea 11/25/2006   Past Medical History:  Past Medical History  Diagnosis Date  . Congestive heart failure, unspecified     a. ECHO 6/0: EF 55-60%, mild LVH, mildly dilated RV w. mildly dec fx, RVSP 67  . Chronic atrial fibrillation     failed cardioversion in past  -repeat DC-CV on October 5th, 2010  . Personal history of DVT (deep vein thrombosis) 2008    LLE  . HTN (hypertension)   . Morbid obesity   . Shingles     with postherpetic neuraigia  . GERD (gastroesophageal reflux disease)   . Hyperlipidemia   . Meningitis ~ 1960    hospitalized @ [facility]  . Gallstones     s/p cholecystectomy  . CAD (coronary artery disease)     nonobstructive by cath 8/10 - LAD 40-50% w mild PAH mean 29 w PVR 3.2 [place]  . PE (pulmonary embolism) ~ 2010    LLE  . Shortness of breath dyspnea   . Lung interstitial disease   . Hypoxemia   . COPD (chronic obstructive pulmonary disease)   . On home oxygen therapy     "5L; 24/7" (10/11/2014)  . OSA treated with BiPAP     "I think it's BiPAP"  . AAA (abdominal aortic aneurysm)     small  . History of blood transfusion   . Anemia   . Iron deficiency anemia   . History of blood transfusion     "related to nose bleed"  .  Gout   . Depression   . Chronic kidney disease (CKD), stage II (mild)    Past Surgical History:  Past Surgical History  Procedure Laterality Date  . Appendectomy  1947  . Tonsillectomy  1946  . Cardioversion  01/2007    x2  . Right heart catheterization N/A 02/28/2014    Procedure: RIGHT HEART CATH;  Surgeon: Laurey Moralealton S McLean, MD;  [facility];  Service: Cardiovascular;  Laterality: N/A;  . Cardiac catheterization  02/2014  . Cataract extraction w/  intraocular lens  implant, bilateral Bilateral    Social History:  reports that she has never smoked. She has never used smokeless tobacco. She reports that she drinks alcohol. She reports that she does not use illicit drugs.  Family / Support Systems Marital Status: Widow/Widower How Long?: 2010 Patient Roles: Parent Children: son, Aneta Minshillip [facility]([place]) @ [phone]; son, Harvie HeckRandy [facility]([place]) @ [phone]; son, Judie GrieveBryan [facility]([place]) @ [phone];  son, Thayer OhmChris (has MS and lives with pt) Other Supports: daughter-in-law (Phillip's wife), Susy Manormy Pix[place] @ [phone];  private duty caregiver, Toni Amendourtney Anticipated Caregiver: Cortney, private aide 5 hrs per day mon-fri. Son Thayer OhmChris can do supervision 24/7, Amy, dtr in law assists on the weekends and prn Ability/Limitations of Caregiver: Thayer OhmChris, 79 yo son lives with pt has MS and is Independendt. Amy helps weekends Caregiver Availability: 24/7 Family Dynamics: Pt describes very good, close relationship with her family.  Notes she does have one other son who lives in [place] and more limited contact with him.  Social History Preferred language: English Religion: Presbyterian Cultural Background: NA Read: Yes Write: Yes Employment Status: Retired Fish farm managerLegal Hisotry/Current Legal Issues: None Guardian/Conservator: None - per MD, pt capable of making decisions on her own behalf   Abuse/Neglect Physical Abuse: Denies Verbal Abuse: Denies Sexual Abuse: Denies Exploitation of patient/patient's resources: Denies Self-Neglect: Denies  Emotional Status Pt's affect, behavior adn adjustment status: Pt very pleasant, talkative and humorous!  She enjoys talking about her family and feels "blessed" to have the support she does.  Very matter-of-fact about her pulmonary fibrosis and reports "It's the fast kind.  But I'm ready whenever.  Just plan to have as much fun as I can til the end."  Denies any s/s of any significant emotional distress - defer formal depression screen at this  time but will monitor. Recent Psychosocial Issues: None noted Pyschiatric History: None Substance Abuse History: None  Patient / Family Perceptions, Expectations & Goals Pt/Family understanding of illness & functional limitations: Pt and family with good understanding of her current level of physical deconditioning and need for CIR Premorbid pt/family roles/activities: Uses DME for mobility but independent overall at home.  Family provide assistance as needed.  does have Courtney 5 hours per day to assist with ADLs and housekeeping Anticipated changes in roles/activities/participation: little change anticipated if she is able to reach targeted goals of supervision Pt/family expectations/goals: "I just want to get some of my strength back."  [employer] Community Agencies: None Premorbid Home Care/DME Agencies: Other (Comment) ([name] for O2 and HH) Transportation available at discharge: yes  Discharge Planning Living Arrangements: Children Support Systems: Children, Other relatives, Home care staff Type of Residence: Private residence Insurance Resources: [employer] Financial Resources: Social Security Financial Screen Referred: No Living Expenses: Own Money Management: Patient Does the patient have any problems obtaining your medications?: No Home Management: [employer] Courtney and dtr-in-law Patient/Family Preliminary Plans: PT fully intends to return to her home and resume care she had PTA Social Work Anticipated Follow Up  Needs: HH/OP Expected length of stay: ELOS 7 days  Clinical Impression Very pleasant, talkative and humorous woman here for physical deconditioning.  Excellent family support and 24/7 care available if needed.  No s/s of any emtotional distress.  Anticipating a short LOS.  Tamsen Reist 11/26/2014, 3:50 PM

## 2014-11-26 NOTE — Progress Notes (Signed)
[name] [name] is a 81 y.o. female [birth year] [id number]  Subjective: No new complaints. No new problems. Slept well. Breathing at baseline. Feeling OK. Asks about "lump" on R forearm at lab stick site  Objective: Vital signs in last 24 hours: Temp:  [98.1 F (36.7 C)-98.6 F (37 C)] 98.6 F (37 C) (07/16 2952) Pulse Rate:  [72-75] 72 (07/16 0638) Resp:  [18-19] 19 (07/16 0638) BP: (121-133)/(63-67) 133/67 mmHg (07/16 0638) SpO2:  [91 %-93 %] 93 % (07/16 0716) Weight:  [125.601 kg (276 lb 14.4 oz)] 125.601 kg (276 lb 14.4 oz) (07/16 8413) Weight change: -1.86 kg (-4 lb 1.6 oz) Last BM Date: 11/25/14  Intake/Output from previous day: 07/15 0701 - 07/16 0700 In: 480 [P.O.:480] Out: -   Physical Exam General: No apparent distress   [place], in [facility], heading to therapy Lungs: mild increased effort with decreased air movment. Lungswith fine crackles and decreased sounds in bases on auscultation, no wheezes. Cardiovascular: distant, regular rate and rhythm, 1+ BLE edema Musculoskeletal:  Neurovascularly intact Wounds:  Small hematoma, resolving at site of RUE "lump" over wrist extensors - no cellulitis or ulcer  Lab Results: BMET    Component Value Date/Time   NA 142 11/24/2014 0517   NA 140 10/04/2014 1618   NA 136 06/18/2013 0443   K 3.6 11/24/2014 0517   K 3.2* 01/24/2014 1608   CL 87* 11/24/2014 0517   CL 95* 06/18/2013 0443   CO2 44* 11/24/2014 0517   CO2 38* 06/18/2013 0443   GLUCOSE 166* 11/24/2014 0517   GLUCOSE 159* 10/04/2014 1618   GLUCOSE 117* 06/18/2013 0443   BUN 73* 11/24/2014 0517   BUN 61* 10/04/2014 1618   BUN 77* 06/18/2013 0443   CREATININE 1.61* 11/24/2014 0517   CREATININE 1.65* 09/17/2013 1646   CREATININE 1.79* 06/18/2013 0443   CALCIUM 9.4 11/24/2014 0517   CALCIUM 9.6 06/18/2013 0443   GFRNONAA 29* 11/24/2014 0517   GFRNONAA 26* 06/18/2013 0443   GFRAA 33* 11/24/2014 0517   GFRAA 31* 06/18/2013 0443   CBC    Component Value Date/Time   WBC  15.0* 11/24/2014 0517   WBC 18.3* 06/18/2013 0443   RBC 4.62 11/24/2014 0517   RBC 2.94* 06/18/2013 0443   HGB 15.0 11/24/2014 0517   HGB 9.1* 06/18/2013 1320   HCT 46.2* 11/24/2014 0517   HCT 38.2 08/04/2014 0420   HCT 26.6* 06/18/2013 0443   PLT 258 11/24/2014 0517   PLT 324 06/18/2013 0443   MCV 100.0 11/24/2014 0517   MCV 90 06/18/2013 0443   MCH 32.5 11/24/2014 0517   MCH 30.1 06/18/2013 0443   MCHC 32.5 11/24/2014 0517   MCHC 33.3 06/18/2013 0443   RDW 16.5* 11/24/2014 0517   RDW 18.3* 06/18/2013 0443   LYMPHSABS 0.5* 11/24/2014 0517   LYMPHSABS 1.7 06/18/2013 0443   MONOABS 0.3 11/24/2014 0517   MONOABS 1.6* 06/18/2013 0443   EOSABS 0.0 11/24/2014 0517   EOSABS 0.3 06/18/2013 0443   BASOSABS 0.0 11/24/2014 0517   BASOSABS 0.1 06/18/2013 0443   CBG's (last 3):  No results for input(s): GLUCAP in the last 72 hours. LFT's Lab Results  Component Value Date   ALT 44 11/19/2014   AST 41 11/19/2014   ALKPHOS 48 11/19/2014   BILITOT 0.7 11/19/2014    Studies/Results: No results found.  Medications:  I have reviewed the patient's current medications. Scheduled Medications: . albuterol  2.5 mg Nebulization TID  . allopurinol  300 mg Oral Daily  .  aspirin EC  81 mg Oral Daily  . atorvastatin  10 mg Oral Daily  . budesonide  0.25 mg Nebulization BID  . diltiazem  240 mg Oral Daily  . escitalopram  20 mg Oral Daily  . famotidine  20 mg Oral Daily  . ferrous sulfate  325 mg Oral BID WC  . furosemide  60 mg Oral TID  . gabapentin  300 mg Oral Daily  . loratadine  10 mg Oral Daily  . methylPREDNISolone (SOLU-MEDROL) injection  40 mg Intravenous Q12H  . metolazone  5 mg Oral Daily  . mupirocin ointment  1 application Nasal BID  . warfarin  0.5 mg Oral ONCE-1800  . Warfarin - Pharmacist Dosing Inpatient   Does not apply q1800   PRN Medications: acetaminophen, albuterol, ondansetron **OR** ondansetron (ZOFRAN) IV, sorbitol  Assessment/Plan: Active Problems:    Debilitated   1. Functional deficits secondary to debilitation related to acute on chronic respiratory failure with pulmonary fibrosis with chronic oxygen therapy -Solu-Medrol per pulmonary services intravenous for now (patient was on a prednisone taper to off over 3 months prior to admission) 2. DVT Prophylaxis/Anticoagulation: History of DVT,Chronic Coumadin therapy. Monitor for rebleeding episodes 3. Pain Management: Neurontin 300 mg daily. 4. Mood/depression: Lexapro 20 mg daily. Provide emotional support 5. Neuropsych: This patient is capable of making decisions on her own behalf. 6. Skin/Wound Care: Routine skin checks 7. Fluids/Electrolytes/Nutrition: Routine I&O with follow-up chemistries 8. Hypertension/atrial fibrillation. Cardizem 240 mg daily, Zaroxolyn 5 mg daily 9. Diastolic congestive heart failure. Continue Lasix as directed 40 mg 3 times a day and monitor for any signs of fluid overload as well as monitoring renal function 10. MRSA PCR screening positive. Contact precautions 11. Hyperlipidemia. Lipitor 12. Chronic renal insufficiency. Baseline creatinine 1.19-1.61. Follow-up chemistries  Length of stay, days: 2  Valerie A. Felicity CoyerLeschber, MD 11/26/2014, 10:45 AM

## 2014-11-26 NOTE — Progress Notes (Signed)
Occupational Therapy Session Note  Patient Details  Name: [name] [name] MRN: [id number] Date of Birth: 10/14/1931  Today's Date: 11/26/2014 OT Individual Time: 0800-0900 OT Individual Time Calculation (min): 60 min    Short Term Goals: Week 1:  OT Short Term Goal 1 (Week 1): STG=LTG d/t short LOS  Skilled Therapeutic Interventions/Progress Updates:   Patient seen for ADL retraining and instruction, as well as therapeutic activities this morning.    Patient deferred shower stating she completed shower yesterday and only took 2 showers per week.  Agreeable to ADL dressing retraining.  In recliner upon therapist arrival.  Sit to stand mod to min assist.  Ambulates with min assist with RW to wheelchair.  Sit <> stand from chair for LB dressing min assist to mod assist.  Completes UB dressing with setup for gown.  Patient educated on use of reacher to thread underwear over B feet.  Able to pull to thighs and requires stand balance for clothing management up over B hips in standing.  Stats monitored throughout session and were between 77-92% throughout session; destated to high 70s with exertion and standing.  Reviewed AE for bathing/dressing to improve independence; patient receptive.  Completes BUE therapeutic activity tolerance task to self propel wheelchair in hallway; requires increased time and multiple rest breaks.  Ambulates in hospital room from wheelchair to recliner min assist RW and is left in recliner with BLE elevated, O2 on 5L, and needs/call bell in reach.    Therapy Documentation Precautions:  Precautions Precautions: Fall Precaution Comments: monitor O2 sats Restrictions Weight Bearing Restrictions: No Pain: Pain Assessment Pain Score: 2  ADL: ADL ADL Comments: see FIM  See FIM for current functional status  Therapy/Group: Individual Therapy  Eber HongBrown, Kare Dado L 11/26/2014, 12:58 PM

## 2014-11-26 NOTE — Progress Notes (Signed)
ANTICOAGULATION CONSULT NOTE - Follow Up Consult  Pharmacy Consult for coumadin Indication: atrial fibrillation and hx of DVT/PE  Allergies  Allergen Reactions  . Pirfenidone Other (See Comments)    Congestion, breathing diffulty    Patient Measurements: Weight: 276 lb 14.4 oz (125.601 kg) Heparin Dosing Weight:   Vital Signs: Temp: 98.6 F (37 C) (07/16 0638) Temp Source: Oral (07/16 16100638) BP: 133/67 mmHg (07/16 96040638) Pulse Rate: 72 (07/16 0638)  Labs:  Recent Labs  11/24/14 0517 11/25/14 1158 11/26/14 0500  HGB 15.0  --   --   HCT 46.2*  --   --   PLT 258  --   --   LABPROT 22.3* 21.7* 27.1*  INR 1.97* 1.90* 2.55*  CREATININE 1.61*  --   --     Estimated Creatinine Clearance: 36 mL/min (by C-G formula based on Cr of 1.61).  Assessment: 81 yo female with hx of afib and DVT/PE continues on coumadin. INR is slightly above goal at 2.55 today. No bleeding noted.   Goal of Therapy:  INR 1.8-2.2 Monitor platelets by anticoagulation protocol: Yes   Plan:  - Coumadin 0.5mg  PO x 1 tonight (low dose d/t large jump but to prevent dropping too low) - F/u daily INR  [name] [name], [name] [name] 11/26/2014,8:45 AM

## 2014-11-27 ENCOUNTER — Inpatient Hospital Stay (HOSPITAL_COMMUNITY): Payer: Medicare Other | Admitting: Physical Therapy

## 2014-11-27 DIAGNOSIS — I5032 Chronic diastolic (congestive) heart failure: Secondary | ICD-10-CM

## 2014-11-27 DIAGNOSIS — J8410 Pulmonary fibrosis, unspecified: Principal | ICD-10-CM

## 2014-11-27 LAB — PROTIME-INR
INR: 2.96 — AB (ref 0.00–1.49)
Prothrombin Time: 30.3 seconds — ABNORMAL HIGH (ref 11.6–15.2)

## 2014-11-27 NOTE — Progress Notes (Signed)
ANTICOAGULATION CONSULT NOTE - Follow Up Consult  Pharmacy Consult for coumadin Indication: atrial fibrillation and hx of DVT/PE  Allergies  Allergen Reactions  . Pirfenidone Other (See Comments)    Congestion, breathing diffulty    Patient Measurements: Weight: 278 lb 3.2 oz (126.191 kg)  Vital Signs: Temp: 98.7 F (37.1 C) (07/17 0635) Temp Source: Oral (07/17 0635) BP: 133/58 mmHg (07/17 0635) Pulse Rate: 66 (07/17 0635)  Labs:  Recent Labs  11/25/14 1158 11/26/14 0500 11/27/14 0450  LABPROT 21.7* 27.1* 30.3*  INR 1.90* 2.55* 2.96*    Estimated Creatinine Clearance: 36 mL/min (by C-G formula based on Cr of 1.61).  Assessment: 81 yo female with hx of afib and DVT/PE continues on coumadin. INR continues to trend up and remains supratherapeutic at 2.96. No bleeding noted.  Goal of Therapy:  INR 1.8-2.2 Monitor platelets by anticoagulation protocol: Yes   Plan:  - No coumadin tonight - F/u daily INR  Mahmoud Blazejewski, [name] 11/27/2014,9:36 AM

## 2014-11-27 NOTE — Progress Notes (Signed)
Physical Therapy Session Note  Patient Details  Name: Aneta Minseggy T Emami MRN: [id number] Date of Birth: 06/09/1931  Today's Date: 11/27/2014 PT Individual Time: 0930-1015 PT Individual Time Calculation (min): 45 min   Short Term Goals: Week 1:  PT Short Term Goal 1 (Week 1): STG=LTG due to ELOS  Skilled Therapeutic Interventions/Progress Updates:  Pt was seen bedside in the am. Pt performed multiple sit to stand transfers with rolling walker and min guard to min A with verbal cues. Pt ambulated distances of 25 feet x 2 and 10 feet with rolling walker and min guard with verbal cues. Pt connected to protable O2 at 6 liters prior to treatment. O2 sats dropped to 77% ambulating about 25 feet. Pt required 210 seconds to recover to above 90% following ambulation. Pt returned to room and left sitting up in bedside recliner with call bell within reach.   Therapy Documentation Precautions:  Precautions Precautions: Fall Precaution Comments: monitor O2 sats Restrictions Weight Bearing Restrictions: No General:   Pain: No c/o pain.    Locomotion : Ambulation Ambulation/Gait Assistance: 4: Min guard   See FIM for current functional status  Therapy/Group: Individual Therapy  Rayford HalstedMitchell, Yogi Arther G 11/27/2014, 12:48 PM

## 2014-11-27 NOTE — Progress Notes (Signed)
[name] [name] is a 81 y.o. female April 01, 1932 [id number]  Subjective: No new complaints. Up during night due to Lasix effect (same as at home). No new problems. Slept well. Feeling OK.  Objective: Vital signs in last 24 hours: Temp:  [98.7 F (37.1 C)] 98.7 F (37.1 C) (07/17 0635) Pulse Rate:  [66-71] 66 (07/17 0635) Resp:  [16-18] 18 (07/17 0635) BP: (133)/(58) 133/58 mmHg (07/17 0635) SpO2:  [90 %-92 %] 92 % (07/17 0823) Weight:  [126.191 kg (278 lb 3.2 oz)] 126.191 kg (278 lb 3.2 oz) (07/17 0635) Weight change: 0.59 kg (1 lb 4.8 oz) Last BM Date: 11/25/14  Intake/Output from previous day: 07/16 0701 - 07/17 0700 In: 720 [P.O.:720] Out: -   Physical Exam General: MO, in BS recliner - starting AM neb, done w/ bkfst    Lungs: Normal effort. Lungs with fine posterior crackles B, no wheezes. Cardiovascular: Regular rate and rhythm, no edema  Lab Results: BMET    Component Value Date/Time   NA 142 11/24/2014 0517   NA 140 10/04/2014 1618   NA 136 06/18/2013 0443   K 3.6 11/24/2014 0517   K 3.2* 01/24/2014 1608   CL 87* 11/24/2014 0517   CL 95* 06/18/2013 0443   CO2 44* 11/24/2014 0517   CO2 38* 06/18/2013 0443   GLUCOSE 166* 11/24/2014 0517   GLUCOSE 159* 10/04/2014 1618   GLUCOSE 117* 06/18/2013 0443   BUN 73* 11/24/2014 0517   BUN 61* 10/04/2014 1618   BUN 77* 06/18/2013 0443   CREATININE 1.61* 11/24/2014 0517   CREATININE 1.65* 09/17/2013 1646   CREATININE 1.79* 06/18/2013 0443   CALCIUM 9.4 11/24/2014 0517   CALCIUM 9.6 06/18/2013 0443   GFRNONAA 29* 11/24/2014 0517   GFRNONAA 26* 06/18/2013 0443   GFRAA 33* 11/24/2014 0517   GFRAA 31* 06/18/2013 0443   CBC    Component Value Date/Time   WBC 15.0* 11/24/2014 0517   WBC 18.3* 06/18/2013 0443   RBC 4.62 11/24/2014 0517   RBC 2.94* 06/18/2013 0443   HGB 15.0 11/24/2014 0517   HGB 9.1* 06/18/2013 1320   HCT 46.2* 11/24/2014 0517   HCT 38.2 08/04/2014 0420   HCT 26.6* 06/18/2013 0443   PLT 258  11/24/2014 0517   PLT 324 06/18/2013 0443   MCV 100.0 11/24/2014 0517   MCV 90 06/18/2013 0443   MCH 32.5 11/24/2014 0517   MCH 30.1 06/18/2013 0443   MCHC 32.5 11/24/2014 0517   MCHC 33.3 06/18/2013 0443   RDW 16.5* 11/24/2014 0517   RDW 18.3* 06/18/2013 0443   LYMPHSABS 0.5* 11/24/2014 0517   LYMPHSABS 1.7 06/18/2013 0443   MONOABS 0.3 11/24/2014 0517   MONOABS 1.6* 06/18/2013 0443   EOSABS 0.0 11/24/2014 0517   EOSABS 0.3 06/18/2013 0443   BASOSABS 0.0 11/24/2014 0517   BASOSABS 0.1 06/18/2013 0443   CBG's (last 3):  No results for input(s): GLUCAP in the last 72 hours. LFT's Lab Results  Component Value Date   ALT 44 11/19/2014   AST 41 11/19/2014   ALKPHOS 48 11/19/2014   BILITOT 0.7 11/19/2014    Studies/Results: No results found.  Medications:  I have reviewed the patient's current medications. Scheduled Medications: . albuterol  2.5 mg Nebulization TID  . allopurinol  300 mg Oral Daily  . aspirin EC  81 mg Oral Daily  . atorvastatin  10 mg Oral Daily  . budesonide  0.25 mg Nebulization BID  . diltiazem  240 mg Oral Daily  . escitalopram  20 mg Oral Daily  . famotidine  20 mg Oral Daily  . ferrous sulfate  325 mg Oral BID WC  . furosemide  60 mg Oral TID  . gabapentin  300 mg Oral Daily  . loratadine  10 mg Oral Daily  . methylPREDNISolone (SOLU-MEDROL) injection  40 mg Intravenous Q12H  . metolazone  5 mg Oral Daily  . Warfarin - Pharmacist Dosing Inpatient   Does not apply q1800   PRN Medications: acetaminophen, albuterol, ondansetron **OR** ondansetron (ZOFRAN) IV, sorbitol  Assessment/Plan: Active Problems:   Debilitated   1. Functional deficits secondary to debilitation related to acute on chronic respiratory failure with pulmonary fibrosis with chronic oxygen therapy -Solu-Medrol per pulmonary services intravenous for now (patient was on a prednisone taper to off over 3 months prior to admission) 2. DVT Prophylaxis/Anticoagulation: History  of DVT,Chronic Coumadin therapy. Monitor for rebleeding episodes 3. Pain Management: Neurontin 300 mg daily. 4. Mood/depression: Lexapro 20 mg daily. Provide emotional support 5. Neuropsych: This patient is capable of making decisions on her own behalf. 6. Skin/Wound Care: Routine skin checks 7. Fluids/Electrolytes/Nutrition: Routine I&O with follow-up chemistries 8. Hypertension/atrial fibrillation. Cardizem 240 mg daily, Zaroxolyn 5 mg daily 9. Diastolic congestive heart failure. Continue Lasix as directed 40 mg 3 times a day and monitor for any signs of fluid overload as well as monitoring renal function 10. MRSA PCR screening positive. Contact precautions 11. Hyperlipidemia. Lipitor 12. Chronic renal insufficiency. Baseline creatinine 1.19-1.61. Follow-up chemistries  Length of stay, days: 3  Valerie A. Felicity CoyerLeschber, MD 11/27/2014, 8:52 AM

## 2014-11-28 ENCOUNTER — Telehealth: Payer: Self-pay

## 2014-11-28 ENCOUNTER — Inpatient Hospital Stay (HOSPITAL_COMMUNITY): Payer: Medicare Other

## 2014-11-28 ENCOUNTER — Inpatient Hospital Stay (HOSPITAL_COMMUNITY): Payer: Medicare Other | Admitting: Physical Therapy

## 2014-11-28 ENCOUNTER — Inpatient Hospital Stay (HOSPITAL_COMMUNITY): Payer: Medicare Other | Admitting: Occupational Therapy

## 2014-11-28 LAB — PROTIME-INR
INR: 2.16 — ABNORMAL HIGH (ref 0.00–1.49)
Prothrombin Time: 23.9 seconds — ABNORMAL HIGH (ref 11.6–15.2)

## 2014-11-28 MED ORDER — WARFARIN SODIUM 2 MG PO TABS
2.0000 mg | ORAL_TABLET | Freq: Once | ORAL | Status: AC
  Administered 2014-11-28: 2 mg via ORAL
  Filled 2014-11-28: qty 1

## 2014-11-28 NOTE — Progress Notes (Signed)
Patient information reviewed and entered into eRehab system by Syd Newsome, RN, CRRN, PPS Coordinator.  Information including medical coding and functional independence measure will be reviewed and updated through discharge.     Per nursing patient was given "Data Collection Information Summary for Patients in Inpatient Rehabilitation Facilities with attached "Privacy Act Statement-Health Care Records" upon admission.  

## 2014-11-28 NOTE — Progress Notes (Signed)
Occupational Therapy Session Note  Patient Details  Name: [name] [name] MRN: [id number] Date of Birth: 10/19/1931  Today's Date: 11/28/2014 OT Individual Time: [id number]  OT Individual Time Calculation (min): 75 min    Short Term Goals: Week 1:  OT Short Term Goal 1 (Week 1): STG=LTG d/t short LOS  Skilled Therapeutic Interventions/Progress Updates:  Patient seated in recliner upon arrival and verbalized that she hoped to shower this morning.  Engaged in self care retraining to include toileting, shower, dress and groom.   Focused session on activity tolerance, endurance, rest breaks as needed, sit><stands, safe toilet and tub bench transfers.  Patient able to complete above tasks with extra time (rest breaks) and assist to wash below her knees, stand from the toilet (low surface), and set up for brushing teeth while seated in recliner.  Issued LH sponge and patient attempted to use it however she asked for assistance for more thorough washing.  Patient agreed to attempt to stand at sink for oral care as a goal either here at hospital or at home.  Therapy Documentation Precautions:  Precautions Precautions: Fall Precaution Comments: monitor O2 sats (on 5L) Restrictions Weight Bearing Restrictions: No Pain: Denies pain  See FIM for current functional status  Therapy/Group: Individual Therapy  [name] 11/28/2014, 6:52 AM

## 2014-11-28 NOTE — Telephone Encounter (Signed)
Pt's PA for [name] [name] has been approved through 05/30/2015.  Nothing further needed.

## 2014-11-28 NOTE — Progress Notes (Signed)
ANTICOAGULATION CONSULT NOTE - Follow Up Consult  Pharmacy Consult for coumadin Indication: hx of afib and DVT/PE  Allergies  Allergen Reactions  . Pirfenidone Other (See Comments)    Congestion, breathing diffulty    Patient Measurements: Weight: 270 lb 8.1 oz (122.7 kg) Heparin Dosing Weight:   Vital Signs: Temp: 97.8 F (36.6 C) (07/18 0650) Temp Source: Oral (07/18 0650) BP: 127/50 mmHg (07/18 0650) Pulse Rate: 68 (07/18 0650)  Labs:  Recent Labs  11/26/14 0500 11/27/14 0450 11/28/14 0520  LABPROT 27.1* 30.3* 23.9*  INR 2.55* 2.96* 2.16*    Estimated Creatinine Clearance: 35.4 mL/min (by C-G formula based on Cr of 1.61).   Medications:  Scheduled:  . albuterol  2.5 mg Nebulization TID  . allopurinol  300 mg Oral Daily  . aspirin EC  81 mg Oral Daily  . atorvastatin  10 mg Oral Daily  . budesonide  0.25 mg Nebulization BID  . diltiazem  240 mg Oral Daily  . escitalopram  20 mg Oral Daily  . famotidine  20 mg Oral Daily  . ferrous sulfate  325 mg Oral BID WC  . furosemide  60 mg Oral TID  . gabapentin  300 mg Oral Daily  . loratadine  10 mg Oral Daily  . methylPREDNISolone (SOLU-MEDROL) injection  40 mg Intravenous Q12H  . metolazone  5 mg Oral Daily  . Warfarin - Pharmacist Dosing Inpatient   Does not apply q1800   Infusions:    Assessment: 81 yo female with hx of afib and DVT/PE is currently on therapeutic coumadin.  INR today is 2.16. Goal of Therapy:  INR 1.8-2.2 Monitor platelets by anticoagulation protocol: Yes   Plan:  Coumadin 2 mg po x1  Daily PT/INR   Damira Kem, Tsz-Yin 11/28/2014,8:13 AM

## 2014-11-28 NOTE — Progress Notes (Signed)
Occupational Therapy Note  Patient Details  Name: Aneta Minseggy T Lesch MRN: [id number] Date of Birth: 02/20/1932  Today's Date: 11/28/2014 OT Individual Time: 1330-1430 OT Individual Time Calculation (min): 60 min   Pt denied pain Individual therapy  Pt resting in recliner with son present.  Pt transitioned to therapy gym and engaged in dynamic standing activities with multiple sit<>stand from w/c.  Pt folded towels and required rest breaks after 2 towels X 5.  Pt required min verbal cues for sequencing in preparation for sit<>stand.  Pt required steady A for sit<>stand and close supervision for standing balance when standing.  Focus on activity tolerance, sit<>stand, standing balance, and safety awareness.  Pt's O2 sats>90% on 5L O2 throughout session.   Lavone NeriLanier, Refugia Laneve [facility] 11/28/2014, 2:56 PM

## 2014-11-28 NOTE — Telephone Encounter (Signed)
Spoke with [employer] to make aware that pt's Esbriet has been approved, was made aware that pt's monthly copay for this medication is $886.32.  Pt has two main options for copay assistance: 1. [employer]- pt would need to call [phone] Or 2. Esbriet Copay Assistance- pt would need to call [phone]  lmtcb X1 for pt.   Per pt's discharge summary, it looks like her Ricki Rodriguezsbriet is being held.  Dr. [name] will she resume this medication, or should I stop shipment?

## 2014-11-28 NOTE — Telephone Encounter (Signed)
Transitional Care call attempted.  Unable to reach patient at this time.  No voice mail available.  Will attempt to recall later.

## 2014-11-28 NOTE — Progress Notes (Signed)
[place] PHYSICAL MEDICINE & REHABILITATION     PROGRESS NOTE    Subjective/Complaints: Up with therapy this morning. No problems reported over weekend  ROS: Pt denies fever, rash/itching, headache, blurred or double vision, nausea, vomiting, abdominal pain, diarrhea, chest pain, palpitations, dysuria, dizziness, neck or back pain, bleeding, anxiety, or depression   Objective: Vital Signs: Blood pressure 127/50, pulse 68, temperature 97.8 F (36.6 C), temperature source Oral, resp. rate 18, weight 122.7 kg (270 lb 8.1 oz), SpO2 94 %. No results found. No results for input(s): WBC, HGB, HCT, PLT in the last 72 hours. No results for input(s): NA, K, CL, GLUCOSE, BUN, CREATININE, CALCIUM in the last 72 hours.  Invalid input(s): CO CBG (last 3)  No results for input(s): GLUCAP in the last 72 hours.  Wt Readings from Last 3 Encounters:  11/28/14 122.7 kg (270 lb 8.1 oz)  11/24/14 131.2 kg (289 lb 3.9 oz)  10/20/14 133.358 kg (294 lb)    Physical Exam:  Constitutional: She is oriented to person, place, and time.  81 year old right-handed obese female  HENT:  Head: Normocephalic.  Eyes: EOM are normal.  Neck: Normal range of motion. Neck supple. No thyromegaly present.  Cardiovascular:  Cardiac rate controlled. No murmurs.  Respiratory:  Decreased breath sounds at the bases and decreased air movement overall GI: Soft. Bowel sounds are normal. She exhibits no distension.  Neurological: She is alert and oriented to person, place, and time. No cranial nerve deficit. Coordination normal.  Follows commands. UES 5/5 prox to distal. LES: 2/5 HF, 3/5 KE and 4/5 ankles. Decreased LT bilateral distal legs/feet below knees.  Skin: Skin with chronic changes bilaterally. Multiple scars, wounds on both legs. Numerous bruises and ecchymoses on the UE's.  Psychiatric: She has a normal mood and affect. Her behavior is normal.    Assessment/Plan: 1. Functional deficits secondary to  debility related to acute on chronic respiratory failure which require 3+ hours per day of interdisciplinary therapy in a comprehensive inpatient rehab setting. Physiatrist is providing close team supervision and 24 hour management of active medical problems listed below. Physiatrist and rehab team continue to assess barriers to discharge/monitor patient progress toward functional and medical goals. FIM: FIM - Bathing Bathing Steps Patient Completed: Chest, Abdomen, Front perineal area, Right upper leg, Left upper leg Bathing: 2: Max-Patient completes 3-4 [id number] 10 parts or 25-49%  FIM - Upper Body Dressing/Undressing Upper body dressing/undressing steps patient completed: Thread/unthread right sleeve of pullover shirt/dresss, Thread/unthread left sleeve of pullover shirt/dress, Put head through opening of pull over shirt/dress, Pull shirt over trunk Upper body dressing/undressing: 5: Set-up assist to: Obtain clothing/put away FIM - Lower Body Dressing/Undressing Lower body dressing/undressing steps patient completed: Thread/unthread right underwear leg, Thread/unthread left underwear leg, Pull underwear up/down Lower body dressing/undressing: 4: Steadying Assist  FIM - Toileting Toileting steps completed by patient: Adjust clothing prior to toileting, Performs perineal hygiene, Adjust clothing after toileting Toileting Assistive Devices: Grab bar or rail for support Toileting: 5: Supervision: Safety issues/verbal cues  FIM - Diplomatic Services operational officerToilet Transfers Toilet Transfers Assistive Devices: Environmental consultantWalker, Therapist, musicGrab bars Toilet Transfers: 4-To toilet/BSC: Min A (steadying Pt. > 75%), 4-From toilet/BSC: Min A (steadying Pt. > 75%)  FIM - Bed/Chair Transfer Bed/Chair Transfer Assistive Devices: Therapist, occupationalWalker Bed/Chair Transfer: 5: Bed > Chair or W/C: Supervision (verbal cues/safety issues)  FIM - Locomotion: Wheelchair Distance: 40' Locomotion: Wheelchair: 2: Travels 50 - 149 ft with minimal assistance (Pt.>75%) FIM -  Locomotion: Ambulation Locomotion: Ambulation Assistive Devices: Designer, industrial/productWalker - Rolling  Ambulation/Gait Assistance: 4: Min guard Locomotion: Ambulation: 1: Travels less than 50 ft with minimal assistance (Pt.>75%)  Comprehension Comprehension Mode: Auditory Comprehension: 5-Understands complex 90% of the time/Cues < 10% of the time  Expression Expression Mode: Verbal Expression: 5-Expresses complex 90% of the time/cues < 10% of the time  Social Interaction Social Interaction: 6-Interacts appropriately with others with medication or extra time (anti-anxiety, antidepressant).  Problem Solving Problem Solving: 5-Solves complex 90% of the time/cues < 10% of the time  Memory Memory: 6-More than reasonable amt of time  Medical Problem List and Plan:  1. Functional deficits secondary to debilitation related to acute on chronic respiratory failure with pulmonary fibrosis with chronic oxygen therapy/Solu-Medrol per pulmonary services intravenous q12  -patient was on a prednisone taper to off over 3 months prior to admission-  -discuss with pulmonary plan for IV solumedrol taper 2. DVT history/Anticoagulation: Chronic Coumadin therapy.---INR therapeutic--continue 3. Pain Management: Neurontin 300 mg daily.  4. Mood/depression: Lexapro 20 mg daily. In good spirits currently 5. Neuropsych: This patient is capable of making decisions on her own behalf. 6. Skin/Wound Care: continue local care, pressure relief to multiple areas  7. Fluids/Electrolytes/Nutrition: encourage adequate po  8. Hypertension/atrial fibrillation. Cardizem 240 mg daily, Zaroxolyn 5 mg daily 9. Diastolic congestive heart failure. Continue Lasix as directed 40 mg 3 times a day.  -continue i's and o's, weights (122kg) 10. MRSA PCR screening positive. Contact precautions 11. Hyperlipidemia. Lipitor 12. Chronic renal insufficiency. Baseline creatinine 1.19-1.61.    LOS (Days) 4 A FACE TO FACE EVALUATION WAS  PERFORMED  SWARTZ,ZACHARY T 11/28/2014 8:26 AM

## 2014-11-28 NOTE — Progress Notes (Signed)
Physical Therapy Session Note  Patient Details  Name: [name] [name] MRN: [id number] Date of Birth: 23-Nov-1931  Today's Date: 11/28/2014 PT Individual Time: 5056-9794 PT Individual Time Calculation (min): 62 min   Short Term Goals: Week 1:  PT Short Term Goal 1 (Week 1): STG=LTG due to ELOS   Therapy Documentation Precautions:  Precautions Precautions: Fall Precaution Comments: monitor O2 sats Restrictions Weight Bearing Restrictions: No Vital Signs: Therapy Vitals Pulse Rate: 71 BP: 131/70 mmHg Patient Position (if appropriate): Sitting Oxygen Therapy SpO2: 94 % O2 Device: Nasal Cannula O2 Flow Rate (L/min): 5 L/min Pulse Oximetry Type: Continuous     OBSERVATION: Breathing 75/25 anterior chest/diaphragm with initiation at anterior chest; 2:1 inspiration: expiration, shallow breathing with cues for appropriate technique. Accessory muscle use noted at anterior scalene bilaterally and trapezius Forward head, rounded shoulders, increase in thoracic kyphosis. Skin Integrity - skin turgor poor B UE, B UE, cool to palpation,  O2 use: 5LO2 via nasal cannula Chest PT Assessment Intervention: postural correction with focus on promoting effective inspiration/expiration ASSESSMENT INTERVENTION: Pre-intervention BP 131/70 HR 71 SaO2 94% 5LO2 via nasal cannula, RR: 20 Sputum: nickel sized thick, yellow, no odor noted  Cough: spontaneous and nonspontaneous wet, strong and productive.  Auscultation in short sit (anterior fields) decreased breath sounds bilaterally;  rhonchi noted on inspiration left anterior lobe. (posterior fields) L upper/lower lobe: decreased breath sounds throughout rhonchi noted on inspirationthroughout; R upper lobe & R lower lobe: decreased breath sounds noted throughout no adventitious breath sounds noted, R middle lobe: decreased breath sounds on inspiration and wheezes noted on and expiration.  Sit to and from stands transfer with RW close supervision   Patient  ambulated 20 feet and 25 feet with RW min assist. Patient required total assist for O2 line management. Patient demonstrated forward flexed posture with a shuffling gait. Patient educated on upright posture, breathing strategies, and step length.   After first amb: HR: 87 O2: 86% via nasal cannula on 6LO2 after 2 minutes returned to 90% via nasal cannula on 6LO2  After 2nd amb: HR: 77 O2: 86% via nasal cannula on 6LO2 after 38minute returned to 91% via nasal cannula on 6LO2  Patient up and down 1 six inch step with bilateral upper extremity support on right handrail min assist. Patient descended step backwards .  After stair negotiation: HR: 90 O2: 92% via nasal cannula on 6LO2   Patient attempted to utilize bilateral handrails however was unable to ascend 1 six inch step with mod assist.    Patient performed tai chi exercises seated in wheelchair for ten minutes with intermittent rest breaks throughout focusing on strength, endurance, coordination, and controlled breathing.  Patient returned to room at end of session sitting in recliner chair with call bell in hand and all needs met.    See FIM for current functional status  Therapy/Group: Individual Therapy  Retta Diones 11/28/2014, 12:28 PM

## 2014-11-29 ENCOUNTER — Inpatient Hospital Stay (HOSPITAL_COMMUNITY): Payer: Medicare Other

## 2014-11-29 ENCOUNTER — Inpatient Hospital Stay (HOSPITAL_COMMUNITY): Payer: Medicare Other | Admitting: Physical Therapy

## 2014-11-29 ENCOUNTER — Inpatient Hospital Stay (HOSPITAL_COMMUNITY): Payer: Medicare Other | Admitting: Occupational Therapy

## 2014-11-29 DIAGNOSIS — I5031 Acute diastolic (congestive) heart failure: Secondary | ICD-10-CM

## 2014-11-29 DIAGNOSIS — E876 Hypokalemia: Secondary | ICD-10-CM

## 2014-11-29 DIAGNOSIS — J810 Acute pulmonary edema: Secondary | ICD-10-CM | POA: Insufficient documentation

## 2014-11-29 LAB — PROTIME-INR
INR: 1.62 — ABNORMAL HIGH (ref 0.00–1.49)
PROTHROMBIN TIME: 19.2 s — AB (ref 11.6–15.2)

## 2014-11-29 LAB — BASIC METABOLIC PANEL
ANION GAP: 19 — AB (ref 5–15)
ANION GAP: 16 — AB (ref 5–15)
BUN: 124 mg/dL — ABNORMAL HIGH (ref 6–20)
BUN: 122 mg/dL — AB (ref 6–20)
CHLORIDE: 77 mmol/L — AB (ref 101–111)
CO2: 41 mmol/L — ABNORMAL HIGH (ref 22–32)
CO2: 41 mmol/L — AB (ref 22–32)
Calcium: 9.6 mg/dL (ref 8.9–10.3)
Calcium: 9.6 mg/dL (ref 8.9–10.3)
Chloride: 76 mmol/L — ABNORMAL LOW (ref 101–111)
Creatinine, Ser: 1.87 mg/dL — ABNORMAL HIGH (ref 0.44–1.00)
Creatinine, Ser: 2.26 mg/dL — ABNORMAL HIGH (ref 0.44–1.00)
GFR calc Af Amer: 28 mL/min — ABNORMAL LOW (ref >60)
GFR calc Af Amer: 22 mL/min — ABNORMAL LOW (ref >60)
GFR calc non Af Amer: 24 mL/min — ABNORMAL LOW (ref >60)
GFR, EST NON AFRICAN AMERICAN: 19 mL/min — AB (ref >60)
GLUCOSE: 176 mg/dL — AB (ref 65–99)
Glucose, Bld: 175 mg/dL — ABNORMAL HIGH (ref 65–99)
POTASSIUM: 2.2 mmol/L — AB (ref 3.5–5.1)
Potassium: 3.9 mmol/L (ref 3.5–5.1)
SODIUM: 133 mmol/L — AB (ref 135–145)
Sodium: 137 mmol/L (ref 135–145)

## 2014-11-29 MED ORDER — WARFARIN SODIUM 2.5 MG PO TABS
2.5000 mg | ORAL_TABLET | Freq: Once | ORAL | Status: AC
  Administered 2014-11-29: 2.5 mg via ORAL
  Filled 2014-11-29: qty 1

## 2014-11-29 MED ORDER — FUROSEMIDE 40 MG PO TABS
60.0000 mg | ORAL_TABLET | Freq: Three times a day (TID) | ORAL | Status: DC
  Administered 2014-11-29 – 2014-11-30 (×5): 60 mg via ORAL
  Filled 2014-11-29 (×6): qty 1

## 2014-11-29 MED ORDER — POTASSIUM CHLORIDE CRYS ER 20 MEQ PO TBCR
40.0000 meq | EXTENDED_RELEASE_TABLET | Freq: Once | ORAL | Status: DC
  Filled 2014-11-29: qty 2

## 2014-11-29 MED ORDER — POTASSIUM CHLORIDE CRYS ER 20 MEQ PO TBCR
40.0000 meq | EXTENDED_RELEASE_TABLET | Freq: Three times a day (TID) | ORAL | Status: DC
  Administered 2014-11-29 – 2014-11-30 (×4): 40 meq via ORAL
  Filled 2014-11-29 (×4): qty 2
  Filled 2014-11-29: qty 4
  Filled 2014-11-29: qty 2

## 2014-11-29 NOTE — Telephone Encounter (Signed)
Stop shipment

## 2014-11-29 NOTE — Progress Notes (Signed)
Physical Therapy Session Note  Patient Details  Name: [name] [name] T Blackston MRN: [id number] Date of Birth: 03/17/1932  Today's Date: 11/29/2014 PT Individual Time:  -      Short Term Goals: Week 1:  PT Short Term Goal 1 (Week 1): STG=LTG due to ELOS  Skilled Therapeutic Interventions/Progress Updates:    Pt received seated in recliner; no c/o pain and agreeable to treatment. Pt maintained on 6L O2 throughout session. Performed stand pivot transfer recliner > w/c with RW and close supervision due to postural impairments and dec balance. Once in w/c RT arrived to administer breathing treatment. Performed LE strengthening exercises while seated in w/c receiving treatment. Exercises included hip flexion, long arc quad, heel/toe raises, hip adduction all performed 2 sets 20 reps. Requires rest break between exercises due to fatigue. O2 remained above 94% during exercises. Performed 5x sit <>stand with supervision and use of RW upon reaching standing position for balance. O2 monitored following each trial as following; 89%, 83%, 96%, 86%. Allowed pt to recover to>92% following each trial which requires approximately 2-3 min each time. Following last sit>stand pt requests to use restroom; performed gait with RW into bathroom with supervision and totalA for O2 line management. Transferred to toilet and moved gown with supervision. Required max +2 for sit >stand from standard height toilet. Performed gait with RW and supervision from toilet > recliner approx 15 ft. Pt remained seated in recliner with LEs elevated in no apparent distress and all needs within reach at completion of session.  Therapy Documentation Precautions:  Precautions Precautions: Fall Precaution Comments: monitor O2 sats Restrictions Weight Bearing Restrictions: No Vital Signs: Oxygen Therapy SpO2: 94 % O2 Device: Nasal Cannula O2 Flow Rate (L/min): 6 L/min Pain: Pain Assessment Pain Assessment: No/denies pain Pain Score: 0-No  pain  See FIM for current functional status  Therapy/Group: Individual Therapy  Vista Lawmanlizabeth J Tygielski 11/29/2014, 8:30 AM

## 2014-11-29 NOTE — Progress Notes (Signed)
[facility] PHYSICAL MEDICINE & REHABILITATION     PROGRESS NOTE    Subjective/Complaints: Making progress. Feels that she's tolerating activity better.   ROS: Pt denies fever, rash/itching, headache, blurred or double vision, nausea, vomiting, abdominal pain, diarrhea, chest pain, palpitations, dysuria, dizziness, neck or back pain, bleeding, anxiety, or depression   Objective: Vital Signs: Blood pressure 154/56, pulse 63, temperature 98 F (36.7 C), temperature source Oral, resp. rate 18, weight 127.007 kg (280 lb), SpO2 90 %. No results found. No results for input(s): WBC, HGB, HCT, PLT in the last 72 hours.  Recent Labs  11/29/14 0422  NA 137  K 2.2*  CL 77*  GLUCOSE 176*  BUN 124*  CREATININE 1.87*  CALCIUM 9.6   CBG (last 3)  No results for input(s): GLUCAP in the last 72 hours.  Wt Readings from Last 3 Encounters:  11/29/14 127.007 kg (280 lb)  11/24/14 131.2 kg (289 lb 3.9 oz)  10/20/14 133.358 kg (294 lb)    Physical Exam:  Constitutional: She is oriented to person, place, and time.  No distress HENT: oral mucosa pink and moist Head: Normocephalic.  Eyes: EOM are normal.  Neck: Normal range of motion. Neck supple. No thyromegaly present.  Cardiovascular:  Cardiac rate controlled. No murmurs.  Respiratory:  Decreased  air movement overall GI: Soft. Bowel sounds are normal. She exhibits no distension.  Neurological: She is alert and oriented to person, place, and time. No cranial nerve deficit. Coordination normal.  Follows commands. UES 5/5 prox to distal. LES: 2/5 HF, 3/5 KE and 4/5 ankles. Decreased LT bilateral distal legs/feet below knees.  Skin: Skin with chronic changes bilaterally. Multiple scars, wounds on both legs. Numerous bruises and ecchymoses on the UE's.  Psychiatric: She has a normal mood and affect. Her behavior is normal.    Assessment/Plan: 1. Functional deficits secondary to debility related to acute on chronic respiratory  failure which require 3+ hours per day of interdisciplinary therapy in a comprehensive inpatient rehab setting. Physiatrist is providing close team supervision and 24 hour management of active medical problems listed below. Physiatrist and rehab team continue to assess barriers to discharge/monitor patient progress toward functional and medical goals. FIM: FIM - Bathing Bathing Steps Patient Completed: Chest, Abdomen, Front perineal area, Right upper leg, Left upper leg, Right Arm, Left Arm Bathing: 3: Mod-Patient completes 5-7 30f 10 parts or 50-74%  FIM - Upper Body Dressing/Undressing Upper body dressing/undressing steps patient completed: Thread/unthread right sleeve of pullover shirt/dresss, Thread/unthread left sleeve of pullover shirt/dress, Put head through opening of pull over shirt/dress, Pull shirt over trunk (pullover dress) Upper body dressing/undressing: 5: Set-up assist to: Obtain clothing/put away FIM - Lower Body Dressing/Undressing Lower body dressing/undressing steps patient completed:  (declined underware today) Lower body dressing/undressing: 3: Mod-Patient completed 50-74% of tasks  FIM - Toileting Toileting steps completed by patient: Adjust clothing prior to toileting, Performs perineal hygiene, Adjust clothing after toileting Toileting Assistive Devices: Grab bar or rail for support Toileting: 5: Supervision: Safety issues/verbal cues  FIM - Diplomatic Services operational officer Devices: Environmental consultant, Therapist, music Transfers: 5-To toilet/BSC: Supervision (verbal cues/safety issues), 4-From toilet/BSC: Min A (steadying Pt. > 75%)  FIM - Bed/Chair Transfer Bed/Chair Transfer Assistive Devices: Therapist, occupational: 5: Bed > Chair or W/C: Supervision (verbal cues/safety issues), 5: Chair or W/C > Bed: Supervision (verbal cues/safety issues)  FIM - Locomotion: Wheelchair Distance: 40' Locomotion: Wheelchair: 2: Travels 50 - 149 ft with minimal assistance  (Pt.>75%) FIM -  Locomotion: Ambulation Locomotion: Ambulation Assistive Devices: Designer, industrial/productWalker - Rolling Ambulation/Gait Assistance: 4: Min assist Locomotion: Ambulation: 1: Travels less than 50 ft with minimal assistance (Pt.>75%)  Comprehension Comprehension Mode: Auditory Comprehension: 5-Understands complex 90% of the time/Cues < 10% of the time  Expression Expression Mode: Verbal Expression: 5-Expresses complex 90% of the time/cues < 10% of the time  Social Interaction Social Interaction: 6-Interacts appropriately with others with medication or extra time (anti-anxiety, antidepressant).  Problem Solving Problem Solving: 5-Solves complex 90% of the time/cues < 10% of the time  Memory Memory: 6-More than reasonable amt of time  Medical Problem List and Plan:  1. Functional deficits secondary to debilitation related to acute on chronic respiratory failure with pulmonary fibrosis with chronic oxygen therapy/Solu-Medrol per pulmonary services intravenous q12  -patient was on a prednisone taper to off over 3 months prior to admission-  -discuss with pulmonary plan for IV solumedrol taper this week  -reviewed premise of pulmonary rehab and ways to improve stamina 2. DVT history/Anticoagulation: Chronic Coumadin therapy.---INR subtherapeutic--adjust coumadin today 3. Pain Management: Neurontin 300 mg daily. --good control 4. Mood/depression: Lexapro 20 mg daily. In good spirits currently 5. Neuropsych: This patient is capable of making decisions on her own behalf. 6. Skin/Wound Care: continue local care, pressure relief to multiple areas  7. Fluids/Electrolytes/Nutrition: replace k+ aggressively today after reviewing labs  -recheck potassium level at 1300 today 8. Hypertension/atrial fibrillation. Cardizem 240 mg daily, Zaroxolyn 5 mg daily 9. Diastolic congestive heart failure. Continue Lasix 40mg  tid (hold morning dose today) and zaroxolyn daily.  -continue i's and o's, weights  (127kg)  -resumed potassium supplementation 10. MRSA PCR screening positive. Contact precautions 11. Hyperlipidemia. Lipitor 12. Chronic renal insufficiency. Baseline creatinine 1.19-1.61---near baseline now---will maintain same lasix dose (after holding x 1 today)   LOS (Days) 5 A FACE TO FACE EVALUATION WAS PERFORMED  Danelly Hassinger T 11/29/2014 6:51 AM

## 2014-11-29 NOTE — Progress Notes (Signed)
Occupational Therapy Session Note  Patient Details  Name: [name] [name] MRN: [id number] Date of Birth: 11/06/1931  Today's Date: 11/29/2014 OT Individual Time: 1300-1400 OT Individual Time Calculation: 60 min    Short Term Goals: Week 1:  OT Short Term Goal 1 (Week 1): STG=LTG d/t short LOS  Skilled Therapeutic Interventions/Progress Updates:    Pt seen for 1:1 OT session with a focus on dynamic standing balance, sit<>stand, functional transfers, functional mobility, activity tolerance, and safety awareness. Pt maintained on 6L of O2 throughout session. Pt received seated in recliner, no report of pain, and agreeable to complete session. Pt completed sit>stand via RW with min A and ambulated apprx 5' to w/c. Pt propelled w/c to increase functional endurance apprx 40' with increased time and 2 rest breaks. Pt engaged in gardening activity in standing 5 trials for 2 min each with steadying A. O2 stats monitored throughout session, varying from 85-94%. Pt able to increase stats with seated rest breaks using breathing strategies. Pt then engaged in puzzle activity in standing 3x for 1-2 min each trial with min A during transitional movement. Noted fatigue towards end of session with pt tolerance in standing decreased. Pt returned to room and transferred back to recliner with min A. Pt left seated in recliner with all needs in reach.   Therapy Documentation Precautions:  Precautions Precautions: Fall Precaution Comments: monitor O2 sats Restrictions Weight Bearing Restrictions: No General:   Vital Signs:  Pain:   ADL: ADL ADL Comments: see FIM Exercises:   Other Treatments:    See FIM for current functional status  Therapy/Group: Individual Therapy  Alger Memosmily [employer]id [employer] 11/29/2014, 2:05 PM

## 2014-11-29 NOTE — Progress Notes (Signed)
Occupational Therapy Session Note  Patient Details  Name: [name] [name] MRN: [id number] Date of Birth: 09/22/1931  Today's Date: 11/29/2014 OT Individual Time: 1000-1100 OT Individual Time Calculation (min): 60 min   Short Term Goals: Week 1:  OT Short Term Goal 1 (Week 1): STG=LTG d/t short LOS  Skilled Therapeutic Interventions/Progress Updates:  Patient resting in recliner upon arrival stating that she had just completed PT and was just getting comfortable however, she was agreeable to OT therapy session.  Engaged in grooming tasks at sink, doffing housecoat and hand washing some of her clothes.  Focused session on endurance, activity tolerance, sit><stands, and standing tolerance.  Patient reluctantly agreed to stand for above tasks however, required rest breaks during tasks and between tasks resulting in sit><stands 6 times with rest breaks when breathing under control and not labored.  Patient ambulated back to recliner with feet elevated and call bell within reach.  Therapy Documentation Precautions:  Precautions Precautions: Fall Precaution Comments: monitor O2 sats Restrictions Weight Bearing Restrictions: No Pain: Denies pain See FIM for current functional status  Therapy/Group: Individual Therapy  Trestan Vahle 11/29/2014, 3:25 PM

## 2014-11-29 NOTE — Progress Notes (Signed)
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  STUDIES:  7/7 echo 60%, pa 55, RV appears dilated and hypertrophied On parasternal images ? mass in RV but I think this is just a tangential cut of thickened RV. Can consider f/u MRI or TEE if clinically indicated  11/19/14: Confirmed to have PIV-3 infection.  SUBJECTIVE/OVERNIGHT/INTERVAL HX Feeling better today. Was evaluated for CIR vs SNF, she is currently refusing this and wants to go home with her already established home health regimen.   VITAL SIGNS: Temp:  [97.4 F (36.3 C)-98 F (36.7 C)] 98 F (36.7 C) (07/19 0300) Pulse Rate:  [63-78] 63 (07/19 0300) Resp:  [16-18] 18 (07/19 0300) BP: (131-154)/(56-71) 154/56 mmHg (07/19 0300) SpO2:  [90 %-94 %] 94 % (07/19 0824) Weight:  [127.007 kg (280 lb)] 127.007 kg (280 lb) (07/19 0300) HEMODYNAMICS:   VENTILATOR SETTINGS:   INTAKE / OUTPUT:  Intake/Output Summary (Last 24 hours) at 11/29/14 1139 Last data filed at 11/29/14 0903  Gross per 24 hour  Intake   1340 ml  Output      0 ml  Net   1340 ml    PHYSICAL EXAMINATION: General:  In chair , obese, looks deconditioned, no distress Neuro:  Awake, alert, no distress HEENT:  Obese neck Cardiovascular:s1 s2 RRR no r Lungs:  resps even non labored on 6L [place], diminished bases, no audible wheeze  Abdomen:  Bowel sounds positive nontender nondistended Musculoskeletal:  Normal bulk and tone Skin:  Chronic right leg wound well healed no  oozing, 1-2+ BLE edema   LABS:  PULMONARY No results for input(s): PHART, PCO2ART, PO2ART, HCO3, TCO2, O2SAT in the last 168 hours.  Invalid input(s): PCO2, PO2  CBC  Recent Labs Lab 11/23/14 0521 11/24/14 0517  HGB 14.5 15.0  HCT 44.9 46.2*  WBC 12.9* 15.0*  PLT 226 258    COAGULATION  Recent Labs Lab 11/25/14 1158 11/26/14 0500 11/27/14 0450 11/28/14 0520 11/29/14 0422  INR 1.90* 2.55* 2.96* 2.16* 1.62*    CARDIAC   No results for input(s): TROPONINI in the last 168 hours. No results for input(s): PROBNP in the last 168 hours.   CHEMISTRY  Recent Labs Lab 11/22/14 1855 11/22/14 2205 11/23/14 0521 11/24/14 0517 11/29/14 0422  NA 136 139 140 142 137  K 5.7* 3.6 3.2* 3.6 2.2*  CL 85* 86* 88* 87* 77*  CO2 39* 41* 42* 44* 41*  GLUCOSE 273* 209* 161* 166* 176*  BUN 70* 75* 70* 73* 124*  CREATININE 1.83* 1.96* 1.62* 1.61* 1.87*  CALCIUM 9.0 9.4 9.0 9.4 9.6  MG  --   --  2.6*  --   --   PHOS  --   --  4.5  --   --    Estimated Creatinine Clearance: 31.1 mL/min (by C-G formula based on Cr of 1.87).   LIVER  Recent Labs Lab 11/25/14 1158 11/26/14 0500 11/27/14 0450 11/28/14 0520 11/29/14 0422  INR 1.90* 2.55* 2.96* 2.16* 1.62*     INFECTIOUS No results for input(s): LATICACIDVEN, PROCALCITON in the  last 168 hours.   ENDOCRINE CBG (last 3)  No results for input(s): GLUCAP in the last 72 hours.  I reviewed CXR myself, pulmonary edema and pleural effusion noted.  ASSESSMENT / PLAN:  Acute respiratory failure with hypoxemia: has severe, end stage ILD and now likely acute viral pneumonitis.  Despite known diagnosis of CHF she has less swelling now than she has had in months.    - Dx is  Acute viral pneumonitis >? ILD flare - due tp Parainfluenza-3 Viral infection.  PLAN > D/C solumedrol, start prednisone 40 mg PO daily for 1 wk then 15 mg PO x1 month then 10 mg PO x1 month then 5 mg PO x1 month.  > Arrange f/u with Dr. Kendrick Fries upon  discharge. > Change lasix to 40 mg PO BID for 1 wk then will need to f/u with primary to determine continuous dose. > BiPAP QHS.  > Incentive spirometry > Intermittent CXR > Good CIR candidate, PT recommends this, patient would prefer this  Chronic diastolic  congestive heart failure> As noted above this is not clearly an exacerbation of her heart failure at admit   PLAN > Lasix as above > Follow daily basic metabolic panel in am  > ContinueZaroxolyn  paroxysmal A. Fib > Continue diltiazem > Continue warfarin  History of recurrent DVT > Continue warfarin  OSA/OHVS > BIPAP qHS  Hypokalemia: secondary to diuresis > potassium supplementation scheduled with lasix and additional PRN   Code Status: Full. Per discussions thus far with Dr BQ and MR 11/19/14  DISPO:  PT rec's CIR, CIR eval pending  Discussed with rehab extender.  PCCM will be available PRN.  Alyson Reedy, M.D. [facility] Pulmonary/Critical Care Medicine. Pager: [phone]. After hours pager: [phone].  11/29/2014 11:39 AM

## 2014-11-29 NOTE — Progress Notes (Signed)
ANTICOAGULATION CONSULT NOTE - Follow Up Consult  Pharmacy Consult for coumadin Indication: hx of afib and DVT/PE  Allergies  Allergen Reactions  . Pirfenidone Other (See Comments)    Congestion, breathing diffulty    Patient Measurements: Weight: 280 lb (127.007 kg) Heparin Dosing Weight:  Vital Signs: Temp: 98 F (36.7 C) (07/19 0300) Temp Source: Oral (07/19 0300) BP: 154/56 mmHg (07/19 0300) Pulse Rate: 63 (07/19 0300)  Labs:  Recent Labs  11/27/14 0450 11/28/14 0520 11/29/14 0422  LABPROT 30.3* 23.9* 19.2*  INR 2.96* 2.16* 1.62*  CREATININE  --   --  1.87*    Estimated Creatinine Clearance: 31.1 mL/min (by C-G formula based on Cr of 1.87).   Medications:  Scheduled:  . albuterol  2.5 mg Nebulization TID  . allopurinol  300 mg Oral Daily  . aspirin EC  81 mg Oral Daily  . atorvastatin  10 mg Oral Daily  . budesonide  0.25 mg Nebulization BID  . diltiazem  240 mg Oral Daily  . escitalopram  20 mg Oral Daily  . famotidine  20 mg Oral Daily  . ferrous sulfate  325 mg Oral BID WC  . furosemide  60 mg Oral TID  . gabapentin  300 mg Oral Daily  . loratadine  10 mg Oral Daily  . methylPREDNISolone (SOLU-MEDROL) injection  40 mg Intravenous Q12H  . metolazone  5 mg Oral Daily  . potassium chloride  40 mEq Oral TID  . Warfarin - Pharmacist Dosing Inpatient   Does not apply q1800   Infusions:    Assessment: 81 yo female with hx of DVT/PE and afib is currently on subtherapeutic coumadin.  INR is down to 1.62 from 2.16 probably due to no dose few days ago.   Goal of Therapy:  INR 1.8-2.2 Monitor platelets by anticoagulation protocol: Yes   Plan:  - coumadin 2.5 mg po x1 - INR in am  Sakira Dahmer, Tsz-Yin 11/29/2014,8:16 AM

## 2014-11-29 NOTE — Telephone Encounter (Signed)
[name] [employer] [employer] [facility] has been [facility].  Nothing further needed at this time.

## 2014-11-30 ENCOUNTER — Inpatient Hospital Stay (HOSPITAL_COMMUNITY): Payer: Medicare Other

## 2014-11-30 ENCOUNTER — Inpatient Hospital Stay (HOSPITAL_COMMUNITY): Payer: Medicare Other | Admitting: Physical Therapy

## 2014-11-30 ENCOUNTER — Inpatient Hospital Stay (HOSPITAL_COMMUNITY): Payer: Medicare Other | Admitting: Occupational Therapy

## 2014-11-30 DIAGNOSIS — J810 Acute pulmonary edema: Secondary | ICD-10-CM

## 2014-11-30 DIAGNOSIS — J849 Interstitial pulmonary disease, unspecified: Secondary | ICD-10-CM

## 2014-11-30 DIAGNOSIS — J9621 Acute and chronic respiratory failure with hypoxia: Secondary | ICD-10-CM

## 2014-11-30 LAB — BASIC METABOLIC PANEL
Anion gap: 18 — ABNORMAL HIGH (ref 5–15)
BUN: 125 mg/dL — ABNORMAL HIGH (ref 6–20)
CHLORIDE: 79 mmol/L — AB (ref 101–111)
CO2: 39 mmol/L — ABNORMAL HIGH (ref 22–32)
Calcium: 9.9 mg/dL (ref 8.9–10.3)
Creatinine, Ser: 1.91 mg/dL — ABNORMAL HIGH (ref 0.44–1.00)
GFR calc Af Amer: 27 mL/min — ABNORMAL LOW (ref >60)
GFR calc non Af Amer: 24 mL/min — ABNORMAL LOW (ref >60)
Glucose, Bld: 244 mg/dL — ABNORMAL HIGH (ref 65–99)
POTASSIUM: 2.9 mmol/L — AB (ref 3.5–5.1)
SODIUM: 136 mmol/L (ref 135–145)

## 2014-11-30 LAB — PROTIME-INR
INR: 1.65 — ABNORMAL HIGH (ref 0.00–1.49)
Prothrombin Time: 19.6 seconds — ABNORMAL HIGH (ref 11.6–15.2)

## 2014-11-30 MED ORDER — POTASSIUM CHLORIDE CRYS ER 20 MEQ PO TBCR: 40.0000 meq | EXTENDED_RELEASE_TABLET | Freq: Two times a day (BID) | ORAL | Status: DC

## 2014-11-30 MED ORDER — WARFARIN SODIUM 2.5 MG PO TABS
2.5000 mg | ORAL_TABLET | Freq: Once | ORAL | Status: AC
  Administered 2014-11-30: 2.5 mg via ORAL
  Filled 2014-11-30: qty 1

## 2014-11-30 MED ORDER — MAGIC MOUTHWASH W/LIDOCAINE
15.0000 mL | Freq: Three times a day (TID) | ORAL | Status: DC | PRN
  Administered 2014-11-30 (×2): 15 mL via ORAL
  Administered 2014-12-01: 10 mL via ORAL
  Administered 2014-12-01: 15 mL via ORAL
  Administered 2014-12-01: 5 mL via ORAL
  Administered 2014-12-02: 15 mL via ORAL
  Filled 2014-11-30 (×9): qty 15

## 2014-11-30 MED ORDER — POTASSIUM CHLORIDE CRYS ER 20 MEQ PO TBCR
40.0000 meq | EXTENDED_RELEASE_TABLET | Freq: Three times a day (TID) | ORAL | Status: DC
  Administered 2014-11-30 – 2014-12-02 (×7): 40 meq via ORAL
  Filled 2014-11-30 (×6): qty 2

## 2014-11-30 NOTE — Progress Notes (Signed)
Physical Therapy Session Note  Patient Details  Name: [name] [name] MRN: [id number] Date of Birth: [birth year]  Today's Date: 11/30/2014 PT Individual Time: 0930-1032 PT Individual Time Calculation (min): 62 min   Short Term Goals: Week 1:  PT Short Term Goal 1 (Week 1): STG=LTG due to ELOS      Therapy Documentation Precautions:  Precautions Precautions: Fall Precaution Comments: monitor O2 sats Restrictions Weight Bearing Restrictions: No Vital Signs: Oxygen Therapy SpO2: 95 % O2 Device: Nasal Cannula O2 Flow Rate (L/min): 5 L/min Pain: Pain Assessment Pain Assessment: No/denies pain  Sit to and from stands transfer with RW close supervision   Patient ambulated 20 feet and 25 feetx2 with RW close supervision. Patient required min verbal cues  for O2 line management with one episode of min assist. Patient demonstrated forward flexed posture with a shuffling gait. Patient educated on upright posture, breathing strategies, and step length.   After first amb: HR: 88 O2: 84% via nasal cannula on 5LO2 after 2 minutes returned to 90% via nasal cannula on5LO2  After 2nd amb: HR: 110 O2: 82% via nasal cannula on 5LO2 after 90 seconds returned to 91% via nasal cannula on 5LO2  After 3rd amb: HR: 104 O2: 82% via nasal cannula on 5LO2 after 2 minutes returned to 91% via nasal cannula on 5LO2  Patient up and down 2 four inch step with bilateral upper extremity support on right handrail close supervision. Patient descended step backwards .  After stair negotiation: HR: 105 O2: 92% via nasal cannula on 6LO2   Patient attempted to utilize bilateral handrails however was unable to ascend 1 six inch step with mod assist.   Seated there ex: B ankle pumps 3x10 B LAQ 3x10  Patient returned to room at end of session sitting in recliner chair with call bell in hand and all needs met. Discussion with patient at end of session for 24/7 supervision and assistance. Patient verbalized  understanding and in agreement with recommendation. Patient states she will have this upon discharge. Patient declined family training with son for stair negotiation and car transfer despite therapist recommendation. Patient states that her sons have been helping her for years and know how it is done.    See FIM for current functional status  Therapy/Group: Individual Therapy  Retta Diones 11/30/2014, 10:39 AM

## 2014-11-30 NOTE — Progress Notes (Signed)
[place] PHYSICAL MEDICINE & REHABILITATION     PROGRESS NOTE    Subjective/Complaints: Feels fairly well. Still short of breath but a little better able to tolerate activity. Edema less.   ROS: Pt denies fever, rash/itching, headache, blurred or double vision, nausea, vomiting, abdominal pain, diarrhea, chest pain, palpitations, dysuria, dizziness, neck or back pain, bleeding, anxiety, or depression   Objective: Vital Signs: Blood pressure 112/87, pulse 110, temperature 97.9 F (36.6 C), temperature source Oral, resp. rate 20, weight 126.5 kg (278 lb 14.1 oz), SpO2 93 %. No results found. No results for input(s): WBC, HGB, HCT, PLT in the last 72 hours.  Recent Labs  11/29/14 0422 11/29/14 1705  NA 137 133*  K 2.2* 3.9  CL 77* 76*  GLUCOSE 176* 175*  BUN 124* 122*  CREATININE 1.87* 2.26*  CALCIUM 9.6 9.6   CBG (last 3)  No results for input(s): GLUCAP in the last 72 hours.  Wt Readings from Last 3 Encounters:  11/30/14 126.5 kg (278 lb 14.1 oz)  11/24/14 131.2 kg (289 lb 3.9 oz)  10/20/14 133.358 kg (294 lb)    Physical Exam:  Constitutional: She is oriented to person, place, and time.  No distress HENT: oral mucosa pink and moist Head: Normocephalic.  Eyes: EOM are normal.  Neck: Normal range of motion. Neck supple. No thyromegaly present.  Cardiovascular:  Cardiac rate controlled. No murmurs.  Respiratory:  Decreased  air movement overall GI: Soft. Bowel sounds are normal. She exhibits no distension.  Ext: chronic LE edema---1 to 2+ (decreased) Neurological: She is alert and oriented to person, place, and time. No cranial nerve deficit. Coordination normal.  Follows commands. UES 5/5 prox to distal. LES: 2/5 HF, 3/5 KE and 4/5 ankles. Decreased LT bilateral distal legs/feet below knees.  Skin: Skin with chronic changes bilaterally. Multiple scars, wounds on both legs. Numerous bruises and ecchymoses on the UE's.  Psychiatric: She has a normal mood  and affect. Her behavior is normal.    Assessment/Plan: 1. Functional deficits secondary to debility related to acute on chronic respiratory failure which require 3+ hours per day of interdisciplinary therapy in a comprehensive inpatient rehab setting. Physiatrist is providing close team supervision and 24 hour management of active medical problems listed below. Physiatrist and rehab team continue to assess barriers to discharge/monitor patient progress toward functional and medical goals. FIM: FIM - Bathing Bathing Steps Patient Completed: Chest, Abdomen, Front perineal area, Right upper leg, Left upper leg, Right Arm, Left Arm Bathing: 3: Mod-Patient completes 5-7 70f 10 parts or 50-74%  FIM - Upper Body Dressing/Undressing Upper body dressing/undressing steps patient completed: Thread/unthread right sleeve of pullover shirt/dresss, Thread/unthread left sleeve of pullover shirt/dress, Put head through opening of pull over shirt/dress, Pull shirt over trunk (pullover dress) Upper body dressing/undressing: 5: Set-up assist to: Obtain clothing/put away FIM - Lower Body Dressing/Undressing Lower body dressing/undressing steps patient completed:  (declined underware today) Lower body dressing/undressing: 3: Mod-Patient completed 50-74% of tasks  FIM - Toileting Toileting steps completed by patient: Adjust clothing prior to toileting, Adjust clothing after toileting Toileting Assistive Devices: Grab bar or rail for support Toileting: 3: Mod-Patient completed 2 of 3 steps  FIM - Diplomatic Services operational officer Devices: Environmental consultant, Therapist, music Transfers: 5-To toilet/BSC: Supervision (verbal cues/safety issues), 1-Two helpers, 2-From toilet/BSC: Max A (lift and lower assist)  FIM - Banker Devices: Manufacturing systems engineer Transfer: 5: Bed > Chair or W/C: Supervision (verbal cues/safety issues), 5: Chair  or W/C > Bed: Supervision (verbal cues/safety  issues)  FIM - Locomotion: Wheelchair Distance: 40' Locomotion: Wheelchair: 2: Travels 50 - 149 ft with minimal assistance (Pt.>75%) FIM - Locomotion: Ambulation Locomotion: Ambulation Assistive Devices: Designer, industrial/productWalker - Rolling Ambulation/Gait Assistance: 4: Min guard Locomotion: Ambulation: 1: Travels less than 50 ft with minimal assistance (Pt.>75%)  Comprehension Comprehension Mode: Auditory Comprehension: 5-Understands complex 90% of the time/Cues < 10% of the time  Expression Expression Mode: Verbal Expression: 5-Expresses complex 90% of the time/cues < 10% of the time  Social Interaction Social Interaction: 6-Interacts appropriately with others with medication or extra time (anti-anxiety, antidepressant).  Problem Solving Problem Solving: 5-Solves complex 90% of the time/cues < 10% of the time  Memory Memory: 6-More than reasonable amt of time  Medical Problem List and Plan:  1. Functional deficits secondary to debilitation related to acute on chronic respiratory failure with pulmonary fibrosis with chronic oxygen therapy/Solu-Medrol per pulmonary services intravenous q12  -solumedrol dc/ed---prednisone taper now  -outpt pulmonary follow up  -pulmonary rehab? 2. DVT history/Anticoagulation: Chronic Coumadin therapy.---INR remains subtherapeutic--adjust coumadin today 3. Pain Management: Neurontin 300 mg daily. --good control 4. Mood/depression: Lexapro 20 mg daily. In good spirits currently 5. Neuropsych: This patient is capable of making decisions on her own behalf. 6. Skin/Wound Care: continue local care, pressure relief to multiple areas  7. Fluids/Electrolytes/Nutrition: replace k+ aggressively today after reviewing labs  -daily BMET prior to dc 8. Hypertension/atrial fibrillation. Cardizem 240 mg daily, Zaroxolyn 5 mg daily 9. Diastolic congestive heart failure. Lasix decreased to bid (for ?1 week) and zaroxolyn daily.  -continue i's and o's, weights (127kg)  -resumed  potassium supplementation  -follow up with pcp regarding longer term lasix dosing  10. MRSA PCR screening positive. Contact precautions 11. Hyperlipidemia. Lipitor 12. Chronic renal insufficiency. Baseline creatinine 1.19-1.61---   LOS (Days) 6 A FACE TO FACE EVALUATION WAS PERFORMED  Renuka Farfan T 11/30/2014 8:16 AM

## 2014-11-30 NOTE — Progress Notes (Signed)
Occupational Therapy Note  Patient Details  Name: [name] [name] MRN: [id number] Date of Birth: 05/28/1931  Today's Date: 11/30/2014 OT Individual Time: 1100-1200 OT Individual Time Calculation (min): 60 min   Pt denied pain Individual Therapy  Pt resting in recliner upon arrival and agreeable to therapy.  Pt amb with RW to w/c (approx 10') with supervision.  Pt transitioned to day room and engaged in dynamic standing tasks including pruning plants, watering plants, and various therapeutic activities to challenge standing tolerance/endurance.  Pt stood for approx 1 min X 8 with extended rest breaks between activities.  Pt's O2 sats at 84% on 5L O2 after standing approx 1 min; HR 103-140.  Rn notified of elevated HR after minimal activity.  Pt returned to room and amb with RW to recliner.  Focus on increased activity tolerance, sit<>stand, standing balance, and safety awareness.   Lavone NeriLanier, Humaira Sculley [facility] 11/30/2014, 12:06 PM

## 2014-11-30 NOTE — Progress Notes (Signed)
Occupational Therapy Session Note  Patient Details  Name: [name] [name] MRN: [id number] Date of Birth: 11/03/1931  Today's Date: 11/30/2014 OT Individual Time: 1400-1430 OT Individual Time Calculation (min): 30 min    Short Term Goals: Week 1:  OT Short Term Goal 1 (Week 1): STG=LTG d/t short LOS  Skilled Therapeutic Interventions/Progress Updates:    1:1 Pt participate in functional ambulaiton short distances with RW with supervision and extra time for decr endurance. Pt performed car transfer into simulated 4 door [place]. Pt perform transfer by stepping into car with left LE and then sitting down and putting right LE in the car. Discussed safer option to sit ont he Edge of the seat and then turn to put bilateral LEs in the car. Pt agreed this would be safer and would not consume so much energy. Pt did perform tasks with close supervision but with increased dyspnea requiring prolonged rest break for rest and to bring up O2 sats to 90 (from 83% on 6 liters on O2 tank). Pt returned to room and ambulated to recliner to rest with supervision.   Therapy Documentation Precautions:  Precautions Precautions: Fall Precaution Comments: monitor O2 sats Restrictions Weight Bearing Restrictions: No Pain:  no c/o pain just fatigue ADL: ADL ADL Comments: see FIM  See FIM for current functional status  Therapy/Group: Individual Therapy  Roney MansSmith, Jailani Hogans [facility] 11/30/2014, 3:53 PM

## 2014-11-30 NOTE — Progress Notes (Signed)
Placed patient on BIPAP for the night.  Patient is tolerating well at this time. 

## 2014-11-30 NOTE — Telephone Encounter (Signed)
Transitional care call attempted.  No answer.  Will attempt to recall later.

## 2014-11-30 NOTE — Progress Notes (Signed)
Occupational Therapy Session Note  Patient Details  Name: [name] [name] MRN: [id number] Date of Birth: 01/11/1932  Today's Date: 11/30/2014 OT Individual Time: 0800-0900 OT Individual Time Calculation (min): 60 min    Short Term Goals: Week 1:  OT Short Term Goal 1 (Week 1): STG=LTG d/t short LOS  Skilled Therapeutic Interventions/Progress Updates:    Pt engaged in BADL retraining including bathing and dressing with sit<>stand from w/c at sink.  Pt amb with RW from recliner to w/c at sink (approx 15') to complete BADLs.  Pt performed sit<>stand and amb with RW at supervision level. Pt required rest breaks after standing approx 45 seconds and O2 sats decreased to 85% on 5L O2. Pt required approx 1 min for O2 sats to rebound.  Pt required multiple rest breaks throughout session.  Pt requires more than a reasonable amount of time to complete tasks.  Focus on activity tolerance, sit<>stand, standing balance, functional amb with RW, and safety awareness to increased independence with BADLs. Pt continues to require supervision for ambulation/O2 tubing management and toilet transfers/toileting secondary to decreased activity tolerance.  Therapy Documentation Precautions:  Precautions Precautions: Fall Precaution Comments: monitor O2 sats Restrictions Weight Bearing Restrictions: No Pain: Pain Assessment Pain Assessment: No/denies pain ADL: ADL ADL Comments: see FIM  See FIM for current functional status  Therapy/Group: Individual Therapy  Rich BraveLanier, [name] 11/30/2014, 9:04 AM

## 2014-11-30 NOTE — Progress Notes (Signed)
ANTICOAGULATION CONSULT NOTE - Follow Up Consult  Pharmacy Consult for coumadin Indication: hx of afib and DVT/PE  Allergies  Allergen Reactions  . Pirfenidone Other (See Comments)    Congestion, breathing diffulty    Patient Measurements: Weight: 278 lb 14.1 oz (126.5 kg) Heparin Dosing Weight:   Vital Signs: Temp: 97.9 F (36.6 C) (07/20 0632) Temp Source: Oral (07/20 16100632) BP: 112/87 mmHg (07/20 96040632) Pulse Rate: 110 (07/20 0632)  Labs:  Recent Labs  11/28/14 0520 11/29/14 0422 11/29/14 1705 11/30/14 0416  LABPROT 23.9* 19.2*  --  19.6*  INR 2.16* 1.62*  --  1.65*  CREATININE  --  1.87* 2.26*  --     Estimated Creatinine Clearance: 25.7 mL/min (by C-G formula based on Cr of 2.26).   Medications:  Scheduled:  . albuterol  2.5 mg Nebulization TID  . allopurinol  300 mg Oral Daily  . aspirin EC  81 mg Oral Daily  . atorvastatin  10 mg Oral Daily  . budesonide  0.25 mg Nebulization BID  . diltiazem  240 mg Oral Daily  . escitalopram  20 mg Oral Daily  . famotidine  20 mg Oral Daily  . ferrous sulfate  325 mg Oral BID WC  . furosemide  60 mg Oral TID  . gabapentin  300 mg Oral Daily  . loratadine  10 mg Oral Daily  . methylPREDNISolone (SOLU-MEDROL) injection  40 mg Intravenous Q12H  . metolazone  5 mg Oral Daily  . potassium chloride  40 mEq Oral TID  . Warfarin - Pharmacist Dosing Inpatient   Does not apply q1800   Infusions:    Assessment: 81 yo female with hx of afib and DVT/PE is currently on subtherapeutic coumadin.  INR is up to 1.65.  Patient was supratherapeutic on 3.75 mg daily previously.  Goal of Therapy:  INR 1.8-2.2 Monitor platelets by anticoagulation protocol: Yes   Plan:  Coumadin 2.5 mg po x1  Daily PT/INR   Dezaria Methot, Tsz-Yin 11/30/2014,8:18 AM

## 2014-11-30 NOTE — Patient Care Conference (Signed)
Inpatient RehabilitationTeam Conference and Plan of Care Update Date: 11/29/2014   Time: 2:50 PM    Patient Name: [name] [name]      Medical Record Number: [id number]  Date of Birth: 1931-05-26 Sex: Female         Room/Bed: 4M06C/4M06C-01 Payor Info: Payor: MEDICARE / Plan: MEDICARE PART A AND B / Product Type: *No Product type* /    Admitting Diagnosis: deconditioned  Admit Date/Time:  11/24/2014  6:11 PM Admission Comments: No comment available   Primary Diagnosis:  Debilitated Principal Problem: Debilitated  Patient Active Problem List   Diagnosis Date Noted  . Acute pulmonary edema   . Debilitated 11/24/2014  . Acute respiratory failure with hypoxia 11/16/2014  . Pulmonary edema   . Pulmonary fibrosis   . Chronic anticoagulation 10/13/2014  . Hypoxia 10/11/2014  . Steroid toxicity 10/05/2014  . Leg swelling 10/04/2014  . AKI (acute kidney injury)   . Acute on chronic respiratory failure 08/02/2014  . History of DVT (deep vein thrombosis) 08/01/2014  . CAP (community acquired pneumonia) 08/01/2014  . SOB (shortness of breath) 08/01/2014  . Interstitial lung disease 08/01/2014  . Acute bronchitis 04/26/2014  . Counseling regarding end of life decision making 04/12/2014  . ILD (interstitial lung disease) 04/05/2014  . Encounter for anticoagulation discussion and counseling 04/05/2014  . Acute on chronic respiratory failure with hypoxemia 02/21/2014  . Non-compliant behavior 02/18/2014  . CHF exacerbation 02/18/2014  . Hx pulmonary embolism 02/18/2014  . Peripheral neuropathy 09/17/2013  . Acute-on-chronic respiratory failure 08/31/2013  . Acute on chronic diastolic heart failure 08/31/2013  . Hypokalemia 08/24/2013  . Encounter for therapeutic drug monitoring 07/28/2013  . CKD (chronic kidney disease) stage 3, GFR 30-59 ml/min 06/24/2013  . Iron deficiency anemia 06/24/2013  . Chronic hypoxemic respiratory failure 09/29/2012  . Wound of lower extremity due to hematoma  08/16/2012  . Gout, chronic 02/12/2012  . Pure hypercholesterolemia 07/09/2010  . SLEEP RELATED HYPOVENTILATION/HYPOXEMIA CCE 07/03/2010  . OBESITY-MORBID (>100') 04/04/2009  . DIASTOLIC HEART FAILURE, CHRONIC 02/02/2009  . Abdominal aortic aneurysm 12/23/2008  . POSTHERPETIC NEURALGIA 03/09/2008  . Allergic rhinitis 12/02/2007  . DEPRESSION, ACUTE 06/03/2007  . MENINGITIS, HX OF 11/26/2006  . SHINGLES, HX OF 11/26/2006  . Essential hypertension 11/25/2006  . EMBOLISM & INFARCTION, IATROGENIC PULMONARY 11/25/2006  . PAF (paroxysmal atrial fibrillation) 11/25/2006  . THROMBOSIS, VENOUS NOS 11/25/2006  . Obstructive sleep apnea 11/25/2006    Expected Discharge Date: Expected Discharge Date: 12/02/14  Team Members Present: Physician leading conference: Dr. Faith Rogue Social Worker Present: Amada Jupiter, LCSW Nurse Present: Chana Bode, RN PT Present: Katherine Mantle, PT OT Present: Roney Mans, OT;Ardis Rowan, COTA SLP Present: Feliberto Gottron, SLP PPS Coordinator present : Tora Duck, RN, CRRN     Current Status/Progress Goal Weekly Team Focus  Medical   chronic PF with acute respiratory failure and subsequent deconditioning on top of her baseline  improve activity tolerance  respiratory mgt, edema control   Bowel/Bladder   Continent of bowel and bladder; LBM 7/20  Min assist  Remain continent of bowel and bladder   Swallow/Nutrition/ Hydration             ADL's   bathing-mod A; UB dressing-supervision; LB dressing-mod A; toilet transfers-supervision; toileting-mod A' limited activity tolerance  bathing/dressing-mod A; toilet transfers/toileting-mod I  functional transfers; toileting tasks; activity tolerance   Mobility   supervision transfers with rolling walker; 20 feet ambulation rolling walker; total assist O2 line management; mod assist 1 step  left handrail  supervision with all functional mobility   endurance, strengthening, balance, breathing strategies, and  functional mobility.    Communication             Safety/Cognition/ Behavioral Observations            Pain   no c/o of pain  <4 on a 0-10 scale  assess pain q 4hr and medicate as needed   Skin   Generalized bruising, MASD with barrier cream applied  no new skin breakdown while on rehab  assess skin q shift    Rehab Goals Patient on target to meet rehab goals: Yes *See Care Plan and progress notes for long and short-term goals.  Barriers to Discharge: baseline respiratory deficits    Possible Resolutions to Barriers:  pacing, cardiopulmonary stamina rx, education    Discharge Planning/Teaching Needs:  home with family and caregiver providing 24/7 assistance      Team Discussion:  Chronic issues. Currently mod assist with ADLs and toileting with mod i goals.  Making good gains and reaching premorbid levels.    Revisions to Treatment Plan:  None   Continued Need for Acute Rehabilitation Level of Care: The patient requires daily medical management by a physician with specialized training in physical medicine and rehabilitation for the following conditions: Daily direction of a multidisciplinary physical rehabilitation program to ensure safe treatment while eliciting the highest outcome that is of practical value to the patient.: Yes Daily medical management of patient stability for increased activity during participation in an intensive rehabilitation regime.: Yes Daily analysis of laboratory values and/or radiology reports with any subsequent need for medication adjustment of medical intervention for : Pulmonary problems;Neurological problems;Post surgical problems  [name] [name] 12/01/2014, 11:12 AM

## 2014-12-01 ENCOUNTER — Inpatient Hospital Stay (HOSPITAL_COMMUNITY): Payer: Medicare Other | Admitting: Physical Therapy

## 2014-12-01 ENCOUNTER — Inpatient Hospital Stay (HOSPITAL_COMMUNITY): Payer: Medicare Other

## 2014-12-01 LAB — BASIC METABOLIC PANEL
Anion gap: 16 — ABNORMAL HIGH (ref 5–15)
BUN: 120 mg/dL — ABNORMAL HIGH (ref 6–20)
CHLORIDE: 80 mmol/L — AB (ref 101–111)
CO2: 42 mmol/L — ABNORMAL HIGH (ref 22–32)
CREATININE: 1.76 mg/dL — AB (ref 0.44–1.00)
Calcium: 9.8 mg/dL (ref 8.9–10.3)
GFR calc Af Amer: 30 mL/min — ABNORMAL LOW (ref >60)
GFR, EST NON AFRICAN AMERICAN: 26 mL/min — AB (ref >60)
Glucose, Bld: 202 mg/dL — ABNORMAL HIGH (ref 65–99)
Potassium: 3.2 mmol/L — ABNORMAL LOW (ref 3.5–5.1)
Sodium: 138 mmol/L (ref 135–145)

## 2014-12-01 LAB — PROTIME-INR
INR: 1.73 — AB (ref 0.00–1.49)
PROTHROMBIN TIME: 20.3 s — AB (ref 11.6–15.2)

## 2014-12-01 MED ORDER — FUROSEMIDE 40 MG PO TABS
40.0000 mg | ORAL_TABLET | Freq: Two times a day (BID) | ORAL | Status: DC
  Administered 2014-12-01 – 2014-12-02 (×2): 40 mg via ORAL
  Filled 2014-12-01 (×2): qty 1

## 2014-12-01 MED ORDER — WARFARIN SODIUM 2.5 MG PO TABS
2.5000 mg | ORAL_TABLET | Freq: Once | ORAL | Status: AC
  Administered 2014-12-01: 2.5 mg via ORAL
  Filled 2014-12-01 (×2): qty 1

## 2014-12-01 MED ORDER — FUROSEMIDE 40 MG PO TABS
60.0000 mg | ORAL_TABLET | Freq: Two times a day (BID) | ORAL | Status: DC
  Administered 2014-12-01: 60 mg via ORAL

## 2014-12-01 MED ORDER — PREDNISONE 20 MG PO TABS
40.0000 mg | ORAL_TABLET | Freq: Every day | ORAL | Status: DC
  Administered 2014-12-01: 40 mg via ORAL
  Filled 2014-12-01 (×3): qty 2

## 2014-12-01 NOTE — Progress Notes (Signed)
Occupational Therapy Session Note  Patient Details  Name: [name] [name] MRN: [id number] Date of Birth: 1931-09-20  Today's Date: 12/01/2014 OT Individual Time: 0800-0900 OT Individual Time Calculation (min): 60 min    Short Term Goals: Week 1:  OT Short Term Goal 1 (Week 1): STG=LTG d/t short LOS  Skilled Therapeutic Interventions/Progress Updates:    Pt engaged in BADL retraining including bathing and dressing with sit<>stand from w/c at sink.  Pt wears house gowns at home which facilitates dressing and toileting tasks.  Pt requires mod A for bathing and dressing tasks.  Pt currently has an aide 5 hrs/day X 5 days.  Pt requires more than a reasonable amount of time to complete tasks with multiple rest breaks.  Pt's O2 sats>90% on 5L O2 with rest breaks approx every 60 seconds.  Pt is pleased with her progress and feels she is ready for discharge home tomorrow.  Pt states her sister will initially be with her after discharge. Focus on activity tolerance, sit<>stand, standing balance, functional amb with RW, and safety awareness.  Therapy Documentation Precautions:  Precautions Precautions: Fall Precaution Comments: monitor O2 sats Restrictions Weight Bearing Restrictions: No Pain: Pain Assessment Pain Assessment: No/denies pain ADL: ADL ADL Comments: see FIM  See FIM for current functional status  Therapy/Group: Individual Therapy  Rich Brave 12/01/2014, 9:03 AM

## 2014-12-01 NOTE — Progress Notes (Signed)
[place] PHYSICAL MEDICINE & REHABILITATION     PROGRESS NOTE    Subjective/Complaints: Feeling better. Still short winded. Swelling improved.   ROS: Pt denies fever, rash/itching, headache, blurred or double vision, nausea, vomiting, abdominal pain, diarrhea, chest pain, palpitations, dysuria, dizziness, neck or back pain, bleeding, anxiety, or depression   Objective: Vital Signs: Blood pressure 126/71, pulse 106, temperature 98.6 F (37 C), temperature source Oral, resp. rate 18, weight 125.374 kg (276 lb 6.4 oz), SpO2 93 %. No results found. No results for input(s): WBC, HGB, HCT, PLT in the last 72 hours.  Recent Labs  11/30/14 0911 12/01/14 0428  NA 136 138  K 2.9* 3.2*  CL 79* 80*  GLUCOSE 244* 202*  BUN 125* 120*  CREATININE 1.91* 1.76*  CALCIUM 9.9 9.8   CBG (last 3)  No results for input(s): GLUCAP in the last 72 hours.  Wt Readings from Last 3 Encounters:  12/01/14 125.374 kg (276 lb 6.4 oz)  11/24/14 131.2 kg (289 lb 3.9 oz)  10/20/14 133.358 kg (294 lb)    Physical Exam:  Constitutional: She is oriented to person, place, and time.  No distress HENT: oral mucosa pink and moist Head: Normocephalic.  Eyes: EOM are normal.  Neck: Normal range of motion. Neck supple. No thyromegaly present.  Cardiovascular:  Cardiac rate controlled. No murmurs.  Respiratory:  Decreased  air movement overall GI: Soft. Bowel sounds are normal. She exhibits no distension.  Ext: chronic LE edema---1 to 2+ (decreased) Neurological: She is alert and oriented to person, place, and time. No cranial nerve deficit. Coordination normal.  Follows commands. UES 5/5 prox to distal. LES: 2/5 HF, 3/5 KE and 4/5 ankles. Decreased LT bilateral distal legs/feet below knees.  Skin: Skin with chronic changes bilaterally. Multiple scars, wounds on both legs. Numerous bruises and ecchymoses on the UE's.  Psychiatric: She has a normal mood and affect. Her behavior is normal.     Assessment/Plan: 1. Functional deficits secondary to debility related to acute on chronic respiratory failure which require 3+ hours per day of interdisciplinary therapy in a comprehensive inpatient rehab setting. Physiatrist is providing close team supervision and 24 hour management of active medical problems listed below. Physiatrist and rehab team continue to assess barriers to discharge/monitor patient progress toward functional and medical goals. FIM: FIM - Bathing Bathing Steps Patient Completed: Chest, Abdomen, Front perineal area, Right upper leg, Left upper leg, Right Arm, Left Arm Bathing: 0: Activity did not occur  FIM - Upper Body Dressing/Undressing Upper body dressing/undressing steps patient completed: Thread/unthread right sleeve of pullover shirt/dresss, Thread/unthread left sleeve of pullover shirt/dress, Put head through opening of pull over shirt/dress, Pull shirt over trunk (pullover dress) Upper body dressing/undressing: 0: Activity did not occur FIM - Lower Body Dressing/Undressing Lower body dressing/undressing steps patient completed:  (declined underware today) Lower body dressing/undressing: 0: Activity did not occur  FIM - Toileting Toileting steps completed by patient: Adjust clothing prior to toileting, Adjust clothing after toileting Toileting Assistive Devices: Grab bar or rail for support Toileting: 3: Mod-Patient completed 2 of 3 steps  FIM - Diplomatic Services operational officer Devices: Psychiatrist Transfers: 5-To toilet/BSC: Supervision (verbal cues/safety issues)  FIM - Banker Devices: Therapist, occupational: 5: Bed > Chair or W/C: Supervision (verbal cues/safety issues), 5: Chair or W/C > Bed: Supervision (verbal cues/safety issues)  FIM - Locomotion: Wheelchair Distance: 40' Locomotion: Wheelchair: 2: Travels 50 - 149 ft with minimal assistance (Pt.>75%) FIM -  Locomotion:  Ambulation Locomotion: Ambulation Assistive Devices: Designer, industrial/product Ambulation/Gait Assistance: 4: Min guard Locomotion: Ambulation: 1: Travels less than 50 ft with minimal assistance (Pt.>75%)  Comprehension Comprehension Mode: Auditory Comprehension: 5-Understands complex 90% of the time/Cues < 10% of the time  Expression Expression Mode: Verbal Expression: 5-Expresses complex 90% of the time/cues < 10% of the time  Social Interaction Social Interaction: 6-Interacts appropriately with others with medication or extra time (anti-anxiety, antidepressant).  Problem Solving Problem Solving: 5-Solves complex 90% of the time/cues < 10% of the time  Memory Memory: 6-More than reasonable amt of time  Medical Problem List and Plan:  1. Functional deficits secondary to debilitation related to acute on chronic respiratory failure with pulmonary fibrosis with chronic oxygen therapy/Solu-Medrol per pulmonary services intravenous q12  -solumedrol dc/ed--->prednisone taper -->  daily for 1 week,  qd for one month,  for one month then  for month and off  -outpt pulmonary follow up  -pulmonary rehab? 2. DVT history/Anticoagulation: Chronic Coumadin therapy.---INR remains subtherapeutic--adjust coumadin today 3. Pain Management: Neurontin 300 mg daily. --good control 4. Mood/depression: Lexapro 20 mg daily. In good spirits currently 5. Neuropsych: This patient is capable of making decisions on her own behalf. 6. Skin/Wound Care: continue local care, pressure relief to multiple areas  7. Fluids/Electrolytes/Nutrition: replace k+ aggressively today after reviewing labs  -daily BMET reviewed today---continue to supplement k+ tid 8. Hypertension/atrial fibrillation. Cardizem 240 mg daily, Zaroxolyn 5 mg daily 9. Diastolic congestive heart failure. Lasix decreased to bid (for ?1 week) and zaroxolyn daily.  -continue i's and o's, weights (127kg)  -resumed potassium  supplementation  -follow up with pcp regarding longer term lasix dosing after discharge  10. MRSA PCR screening positive. Contact precautions 11. Hyperlipidemia. Lipitor 12. Chronic renal insufficiency. Baseline creatinine 1.19-1.61---   LOS (Days) 7 A FACE TO FACE EVALUATION WAS PERFORMED  Riku Buttery T 12/01/2014 8:13 AM

## 2014-12-01 NOTE — Discharge Summary (Signed)
[name] [name], [name] [name]                ACCOUNT NO.:  [id number]  MEDICAL RECORD NO.:  [id number]  LOCATION:  4M06C                        FACILITY:  [facility]  PHYSICIAN:  Mariam Dollar, P.A.  DATE OF BIRTH:  1931/08/10  DATE OF ADMISSION:  11/24/2014 DATE OF DISCHARGE:                              DISCHARGE SUMMARY   DISCHARGE DIAGNOSES: 1. Functional deficits secondary to debilitation related to acute on     chronic respiratory failure with pulmonary fibrosis. 2. Chronic Coumadin therapy. 3. Pain management. 4. Depression. 5. Hypertension. 6. Diastolic congestive heart failure. 7. Hypokalemia. 8. Methicillin-resistant Staphylococcus aureus (MRSA) polymerase chain     reaction (PCR) screening positive. 9. Hyperlipidemia. 10.Chronic renal insufficiency, with baseline creatinine 1.19-1.61.  HISTORY OF PRESENT ILLNESS:  This is an 81 year old right-handed female, with history of chronic renal insufficiency and pulmonary fibrosis on 5 L of oxygen at home as well as atrial fibrillation with Coumadin therapy.  She lives with her son, also has a home health aid 5 hours a day.  She used a walker prior to admission.  Presented on November 16, 2014, with increasing shortness of breath, fever, and a cough.  Chest x-ray with no acute abnormalities.  No evidence of pulmonary edema. Appearance consistent with chronic interstitial lung disease.  Pulmonary Services followup suspect acute viral pneumonitis, placed on Solu-Medrol as well as diuresis.  Echocardiogram with ejection fraction of 60%. Normal systolic function.  Chronic Coumadin ongoing.  Contact precautions for MRSA PCR screening positive.  Physical and occupational therapy evaluations completed.  The patient was admitted for comprehensive rehab program.  PAST MEDICAL HISTORY:  See discharge diagnoses.  SOCIAL HISTORY:  Lives with son, independent with assistive device. Functional status upon admission to Rehab Services, minimal guard  to ambulate 20 feet with the rolling walker, minimal guard sit-to-stand, and min-to-mod assist activities of daily living.  PHYSICAL EXAMINATION:  VITAL SIGNS:  Blood pressure 130/57, pulse 66, temperature 97, and respirations 19. GENERAL:  This was an alert female, oriented x3. LUNGS:  Decreased breath sounds.  Clear to auscultation. CARDIAC:  Rate controlled. ABDOMEN:  Soft, nontender.  Good bowel sounds. EXTREMITIES:  Multiple scars to both lower extremities.  Numerous bruises and ecchymoses.  REHABILITATION HOSPITAL COURSE:  The patient was admitted to Inpatient Rehab Services with therapies initiated on a 3-hour daily basis, consisting of physical therapy, occupational therapy, and rehabilitation nursing.  The following issues were addressed during the patient's rehabilitation stay.  Pertaining to [name] [name]'s  debilitation related to acute on chronic respiratory failure, pulmonary fibrosis.  She remained on chronic oxygen therapy, Solu-Medrol taper as advised as well as diuresis.  She would follow up with Pulmonary Services.  She remained on chronic Coumadin therapy.  No bleeding episodes.  She would follow up at the Outpatient Coumadin clinic as well as a home health nurse had been arranged for the necessary blood draws.  Pain management with the use of Neurontin 300 mg daily.  She was maintained on Lexapro for depression with emotional support provided.  She was attending full therapies.  Blood pressure is well controlled.  No orthostasis.  Acute on chronic diastolic congestive heart failure.  Lasix therapy as well as  Zaroxolyn.  Bouts of hypokalemia, secondary to diuresis with potassium supplement as advised.  Contact precautions for MRSA PCR screening positive.  Chronic renal insufficiency, remained at baseline 1.19-1.61. The patient received weekly collaborative interdisciplinary team conferences to discuss estimated length of stay, family teaching, and any barriers to  discharge.  She was ambulating short household distances, rolling walker, close supervision, requiring oxygen as prior to admission.  Educated on upright posture, breathing strategy, and step length.  Activities of daily living including, bathing, dressing, sit to stand from wheelchair at sink, ambulating rolling walker from recliner to wheelchair at sink, perform sit-to-stand with ambulation supervision, rest breaks as advised.  Her strength and endurance did show steady gain.  She was discharged to home.  Full family teaching completed.  DISCHARGE MEDICATIONS:  Included; 1. Zyloprim 300 mg p.o. daily. 2. Aspirin 81 mg p.o. daily. 3. Lipitor 10 mg p.o. daily. 4. Cardizem CD 240 mg p.o. daily. 5. Lexapro 20 mg p.o. daily. 6. Pepcid 20 mg p.o. daily. 7. Ferrous sulfate 325 mg p.o. b.i.d. 8. Lasix currently on 60 mg bid. as advised a planned taper. 9. Neurontin 300 mg p.o. daily. 10.Claritin 10 mg p.o. daily. 11.Solu-Medrol taper. 12.Zaroxolyn 5 mg p.o. daily. 13.Coumadin currently at 2.5 mg daily, adjusted accordingly for an INR     of 2.0-3.0.  DIET:  Low-sodium.  SPECIAL INSTRUCTIONS:  The patient will follow up Dr. Harold Hedge, the Outpatient Rehab Service office as needed; Dr. Craige Cotta, Pulmonary Services; Dr. Ermalene Searing, Medical Management, a home health nurse had been arranged for blood draws on Tuesday, December 06, 2014.  Results to [facility], [phone], fax [phone].  The patient will continue her chronic oxygen prior to admission.     Mariam Dollar, P.A.     DA/MEDQ  D:  12/01/2014  T:  12/01/2014  Job:  [id number]  cc:   Coralyn Helling, MD Kerby Nora, MD

## 2014-12-01 NOTE — Progress Notes (Signed)
Social Work Patient ID: Aneta Mins, female   DOB: 02/26/1932, 81 y.o.   MRN: [id number]   Anselm Pancoast, LCSW Social Worker Signed  Patient Care Conference 11/30/2014  4:02 PM    Expand All Collapse All   Inpatient RehabilitationTeam Conference and Plan of Care Update Date: 11/29/2014   Time: 2:50 PM     Patient Name: [name] [name]       Medical Record Number: [id number]  Date of Birth: Jul 06, 1931 Sex: Female         Room/Bed: 4M06C/4M06C-01 Payor Info: Payor: MEDICARE / Plan: MEDICARE PART A AND B / Product Type: *No Product type* /    Admitting Diagnosis: deconditioned  Admit Date/Time:  11/24/2014  6:11 PM Admission Comments: No comment available   Primary Diagnosis:  Debilitated Principal Problem: Debilitated    Patient Active Problem List     Diagnosis  Date Noted   .  Acute pulmonary edema     .  Debilitated  11/24/2014   .  Acute respiratory failure with hypoxia  11/16/2014   .  Pulmonary edema     .  Pulmonary fibrosis     .  Chronic anticoagulation  10/13/2014   .  Hypoxia  10/11/2014   .  Steroid toxicity  10/05/2014   .  Leg swelling  10/04/2014   .  AKI (acute kidney injury)     .  Acute on chronic respiratory failure  08/02/2014   .  History of DVT (deep vein thrombosis)  08/01/2014   .  CAP (community acquired pneumonia)  08/01/2014   .  SOB (shortness of breath)  08/01/2014   .  Interstitial lung disease  08/01/2014   .  Acute bronchitis  04/26/2014   .  Counseling regarding end of life decision making  04/12/2014   .  ILD (interstitial lung disease)  04/05/2014   .  Encounter for anticoagulation discussion and counseling  04/05/2014   .  Acute on chronic respiratory failure with hypoxemia  02/21/2014   .  Non-compliant behavior  02/18/2014   .  CHF exacerbation  02/18/2014   .  Hx pulmonary embolism  02/18/2014   .  Peripheral neuropathy  09/17/2013   .  Acute-on-chronic respiratory failure  08/31/2013   .  Acute on chronic diastolic heart failure   08/31/2013   .  Hypokalemia  08/24/2013   .  Encounter for therapeutic drug monitoring  07/28/2013   .  CKD (chronic kidney disease) stage 3, GFR 30-59 ml/min  06/24/2013   .  Iron deficiency anemia  06/24/2013   .  Chronic hypoxemic respiratory failure  09/29/2012   .  Wound of lower extremity due to hematoma  08/16/2012   .  Gout, chronic  02/12/2012   .  Pure hypercholesterolemia  07/09/2010   .  SLEEP RELATED HYPOVENTILATION/HYPOXEMIA CCE  07/03/2010   .  OBESITY-MORBID (>100')  04/04/2009   .  DIASTOLIC HEART FAILURE, CHRONIC  02/02/2009   .  Abdominal aortic aneurysm  12/23/2008   .  POSTHERPETIC NEURALGIA  03/09/2008   .  Allergic rhinitis  12/02/2007   .  DEPRESSION, ACUTE  06/03/2007   .  MENINGITIS, HX OF  11/26/2006   .  SHINGLES, HX OF  11/26/2006   .  Essential hypertension  11/25/2006   .  EMBOLISM & INFARCTION, IATROGENIC PULMONARY  11/25/2006   .  PAF (paroxysmal atrial fibrillation)  11/25/2006   .  THROMBOSIS, VENOUS NOS  11/25/2006   .  Obstructive sleep apnea  11/25/2006     Expected Discharge Date: Expected Discharge Date: 12/02/14  Team Members Present: Physician leading conference: Dr. Faith Rogue Social Worker Present: Amada Jupiter, LCSW Nurse Present: Chana Bode, RN PT Present: Katherine Mantle, PT OT Present: Roney Mans, OT;Ardis Rowan, COTA SLP Present: Feliberto Gottron, SLP PPS Coordinator present : Tora Duck, RN, CRRN        Current Status/Progress  Goal  Weekly Team Focus   Medical     chronic PF with acute respiratory failure and subsequent deconditioning on top of her baseline  improve activity tolerance  respiratory mgt, edema control   Bowel/Bladder     Continent of bowel and bladder; LBM 7/20   Min assist  Remain continent of bowel and bladder    Swallow/Nutrition/ Hydration               ADL's     bathing-mod A; UB dressing-supervision; LB dressing-mod A; toilet transfers-supervision; toileting-mod A' limited activity tolerance    bathing/dressing-mod A; toilet transfers/toileting-mod I  functional transfers; toileting tasks; activity tolerance    Mobility     supervision transfers with rolling walker; 20 feet ambulation rolling walker; total assist O2 line management; mod assist 1 step left handrail  supervision with all functional mobility   endurance, strengthening, balance, breathing strategies, and functional mobility.    Communication               Safety/Cognition/ Behavioral Observations              Pain     no c/o of pain  <4 on a 0-10 scale  assess pain q 4hr and medicate as needed    Skin     Generalized bruising, MASD with barrier cream applied   no new skin breakdown while on rehab   assess skin q shift    Rehab Goals Patient on target to meet rehab goals: Yes *See Care Plan and progress notes for long and short-term goals.    Barriers to Discharge:  baseline respiratory deficits     Possible Resolutions to Barriers:   pacing, cardiopulmonary stamina rx, education      Discharge Planning/Teaching Needs:   home with family and caregiver providing 24/7 assistance       Team Discussion:    Chronic issues. Currently mod assist with ADLs and toileting with mod i goals.  Making good gains and reaching premorbid levels.     Revisions to Treatment Plan:    None    Continued Need for Acute Rehabilitation Level of Care: The patient requires daily medical management by a physician with specialized training in physical medicine and rehabilitation for the following conditions: Daily direction of a multidisciplinary physical rehabilitation program to ensure safe treatment while eliciting the highest outcome that is of practical value to the patient.: Yes Daily medical management of patient stability for increased activity during participation in an intensive rehabilitation regime.: Yes Daily analysis of laboratory values and/or radiology reports with any subsequent need for medication adjustment of medical  intervention for : Pulmonary problems;Neurological problems;Post surgical problems  Felica Chargois 12/01/2014, 11:12 AM

## 2014-12-01 NOTE — Progress Notes (Signed)
   11/30/14 2353  BiPAP/CPAP/SIPAP  BiPAP/CPAP/SIPAP Pt Type Adult  Mask Type Full face mask  Mask Size Large  Respiratory Rate 16 breaths/min  IPAP 14 cmH20  EPAP 7 cmH2O  Oxygen Percent 40 %  Flow Rate 5 lpm  BiPAP/CPAP/SIPAP BiPAP  Patient Home Equipment No  Auto Titrate No

## 2014-12-01 NOTE — Progress Notes (Signed)
Occupational Therapy Note  Patient Details  Name: [name] [name] MRN: [id number] Date of Birth: [birth year]  Today's Date: 12/01/2014 OT Individual Time: 8295-6213 OT Individual Time Calculation (min): 84 min   Pt denied pain Individual therapy  Pt initially engaged in practicing toilet transfers and amb with RW approx distance required to access bathroom after discharge (17' and rest before additional 17'). Pt stated that her sons have positioned a chair approx half way to bathroom at home.  Pt transitioned to ADL kitchen and engaged in simple meal prep tasks with mod A. Pt transitioned to dynamic standing activities including retrieving items from elevated surface, reaching outside BOS, and returning items to lower surface.  Pt also engaged in BUE activities while standing to increase standing endurance to facilitate increased independence with BADLs and IADLs.   Lavone Neri [facility] 12/01/2014, 2:31 PM

## 2014-12-01 NOTE — Progress Notes (Signed)
Placed pt on BiPAP, pt tolerating well at this time  

## 2014-12-01 NOTE — Progress Notes (Signed)
Social Work Patient ID: [name] [name], female   DOB: [birth year], 81 y.o.   MRN: [id number]   Met with pt on Tuesday afternoon to review team conference.  She is aware and agreeable with targeted d/c date of 7/22 with supervision/ mod i goals.  Pleased with progress made and strength she is regaining.  Will order re-start of [place] services via [facility].   Hennie Gosa, LCSW

## 2014-12-01 NOTE — Progress Notes (Signed)
Physical Therapy Session Note  Patient Details  Name: [name] [name] MRN: [id number] Date of Birth: May 25, 1931  Today's Date: 12/01/2014 PT Individual Time: 4010-2725 PT Individual Time Calculation (min): 60 min   Short Term Goals: Week 1:  PT Short Term Goal 1 (Week 1): STG=LTG due to ELOS   Therapy Documentation Precautions:  Precautions Precautions: Fall Precaution Comments: monitor O2 sats Restrictions Weight Bearing Restrictions: No General:   Vital Signs: Therapy Vitals BP: 138/88 mmHg Oxygen Therapy SpO2: 93 % O2 Device: Nasal Cannula O2 Flow Rate (L/min): 5 L/min Pain: Pain Assessment Pain Assessment: No/denies pain Mobility:   Locomotion : Ambulation Ambulation/Gait Assistance: 5: Supervision Wheelchair Mobility Distance: 65   Sit to and from stands transfer with RW mod I  Patient ambulated 50 feetx2 with RW close supervision. Patient required min verbal cues for O2 line management. Patient demonstrated forward flexed posture with a shuffling gait. Patient educated on upright posture, breathing strategies, and step length.   After first amb: HR: 119 O2: 83% via nasal cannula on 5LO2 after 2 minutes returned to 91% via nasal cannula on5LO2  Patient propelled wheelchair with BUE 65 feet close supervision  Patient up and down 4 stepsx2 with bilateral upper extremity support on right handrail close supervision. Patient descended step backwards,  verbal cues for O2 line management .  After stair negotiation: HR: 126 O2: 84% via nasal cannula on 6LO2 after 2 minutes returned to 91% via nasal cannula on 6LO2  Patient returned to room at end of session sitting in recliner chair with call bell in hand and all needs met. Reviewed discussion with patient at end of session for 24/7 supervision and assistance. Patient verbalized understanding and in agreement with recommendation. Patient states she will have this upon discharge.    See FIM for current functional  status  Therapy/Group: Individual Therapy  Retta Diones 12/01/2014, 11:26 AM

## 2014-12-01 NOTE — Progress Notes (Signed)
ANTICOAGULATION CONSULT NOTE - Follow Up Consult  Pharmacy Consult for coumadin Indication: hx of afib and DVT/PE  Allergies  Allergen Reactions  . Pirfenidone Other (See Comments)    Congestion, breathing diffulty    Patient Measurements: Weight: 276 lb 6.4 oz (125.374 kg)  Vital Signs: Temp: 98.6 F (37 C) (07/21 0558) Temp Source: Oral (07/21 0558) BP: 138/88 mmHg (07/21 1036) Pulse Rate: 106 (07/21 0558)  Labs:  Recent Labs  11/29/14 0422 11/29/14 1705 11/30/14 0416 11/30/14 0911 12/01/14 0428  LABPROT 19.2*  --  19.6*  --  20.3*  INR 1.62*  --  1.65*  --  1.73*  CREATININE 1.87* 2.26*  --  1.91* 1.76*    Estimated Creatinine Clearance: 32.8 mL/min (by C-G formula based on Cr of 1.76).   Medications:  Scheduled:  . albuterol  2.5 mg Nebulization TID  . allopurinol  300 mg Oral Daily  . aspirin EC  81 mg Oral Daily  . atorvastatin  10 mg Oral Daily  . budesonide  0.25 mg Nebulization BID  . diltiazem  240 mg Oral Daily  . escitalopram  20 mg Oral Daily  . famotidine  20 mg Oral Daily  . ferrous sulfate  325 mg Oral BID WC  . furosemide  60 mg Oral BID  . gabapentin  300 mg Oral Daily  . loratadine  10 mg Oral Daily  . metolazone  5 mg Oral Daily  . potassium chloride  40 mEq Oral TID  . predniSONE  40 mg Oral Q breakfast  . Warfarin - Pharmacist Dosing Inpatient   Does not apply q1800   Infusions:    Assessment: 81 yo female with hx of afib and DVT/PE is currently on subtherapeutic coumadin.  INR is up to 1.73 trending up slowly toward goal.  Patient was supratherapeutic on 3.75 mg daily previously.  Goal of Therapy:  INR 1.8-2.2 Monitor platelets by anticoagulation protocol: Yes   Plan:  Coumadin 2.5 mg po x1  Daily PT/INR   Bayard Hugger, PharmD, BCPS  Clinical Pharmacist  Pager: [phone]   12/01/2014,1:16 PM

## 2014-12-01 NOTE — Progress Notes (Signed)
Occupational Therapy Discharge Summary  Patient Details  Name: [name] [name] MRN: [id number] Date of Birth: 10/13/1931  Patient has met 9 of 9 long term goals due to improved activity tolerance, improved balance and activity tolerance.  Pt made steady progress with BADLs and IADLs during this admission.  Pt is mod I for toilet transfers and toileting, mod A for bathing, mod A for LB dressing, and supervision for UB dressing.  Pt requires mod A for simple meal prep tasks. Pt has an aide 5 hrs/day X 5 days/week.  Pt continues to fatigue quickly and requires multiple rest breaks during BADLs.  Pt employs appropriate energy conservation strategies with activity.  One of pt's sons has been present for therapy.Patient to discharge at overall level she was at PTA with caregiver 5 days a week level.  Patient's care partner is independent to provide the necessary physical and cognitive assistance at discharge.     Recommendation:  Patient will benefit from ongoing skilled OT services in home health setting to continue to advance functional skills in the area of BADL and Reduce care partner burden.  Equipment: No equipment provided Pt owns BSC and shower seat  Reasons for discharge: treatment goals met and discharge from hospital  Patient/family agrees with progress made and goals achieved: Yes  OT Discharge  Vision/Perception  Vision- History Baseline Vision/History: Wears glasses Wears Glasses: At all times Patient Visual Report: No change from baseline Vision- Assessment Vision Assessment?: No apparent visual deficits  Cognition Overall Cognitive Status: Within Functional Limits for tasks assessed Arousal/Alertness: Awake/alert Orientation Level: Oriented X4 Attention: Selective Selective Attention: Appears intact Memory: Appears intact Awareness: Appears intact Problem Solving: Appears intact Safety/Judgment: Appears intact  Motor  Motor Motor: Abnormal postural alignment and  control Motor - Skilled Clinical Observations: kyphotic with flexed posture in standing    Trunk/Postural Assessment  Cervical Assessment Cervical Assessment: Exceptions to [facility] (forward head) Thoracic Assessment Thoracic Assessment: Exceptions to [facility] (kyphotic) Lumbar Assessment Lumbar Assessment: Within Functional Limits    Extremity/Trunk Assessment RUE Assessment RUE Assessment: Exceptions to [facility] (limited ROM premorbid) LUE Assessment LUE Assessment: Exceptions to [facility] (limited ROM premorbid)  See FIM for current functional status  Leroy Libman 12/01/2014, 2:48 PM

## 2014-12-01 NOTE — Discharge Summary (Signed)
Discharge summary job [id number]

## 2014-12-02 LAB — BASIC METABOLIC PANEL
ANION GAP: 15 (ref 5–15)
Anion gap: 14 (ref 5–15)
BUN: 123 mg/dL — ABNORMAL HIGH (ref 6–20)
BUN: 119 mg/dL — AB (ref 6–20)
CALCIUM: 9.8 mg/dL (ref 8.9–10.3)
CALCIUM: 10 mg/dL (ref 8.9–10.3)
CO2: 42 mmol/L — AB (ref 22–32)
CO2: 38 mmol/L — ABNORMAL HIGH (ref 22–32)
CREATININE: 1.95 mg/dL — AB (ref 0.44–1.00)
Chloride: 81 mmol/L — ABNORMAL LOW (ref 101–111)
Chloride: 83 mmol/L — ABNORMAL LOW (ref 101–111)
Creatinine, Ser: 1.86 mg/dL — ABNORMAL HIGH (ref 0.44–1.00)
GFR calc Af Amer: 27 mL/min — ABNORMAL LOW (ref >60)
GFR calc Af Amer: 28 mL/min — ABNORMAL LOW (ref >60)
GFR calc non Af Amer: 23 mL/min — ABNORMAL LOW (ref >60)
GFR calc non Af Amer: 24 mL/min — ABNORMAL LOW (ref >60)
GLUCOSE: 158 mg/dL — AB (ref 65–99)
Glucose, Bld: 199 mg/dL — ABNORMAL HIGH (ref 65–99)
Potassium: 2.9 mmol/L — ABNORMAL LOW (ref 3.5–5.1)
Potassium: 3.1 mmol/L — ABNORMAL LOW (ref 3.5–5.1)
SODIUM: 136 mmol/L (ref 135–145)
Sodium: 137 mmol/L (ref 135–145)

## 2014-12-02 LAB — PROTIME-INR
INR: 1.84 — AB (ref 0.00–1.49)
Prothrombin Time: 21.2 seconds — ABNORMAL HIGH (ref 11.6–15.2)

## 2014-12-02 MED ORDER — WARFARIN SODIUM 2.5 MG PO TABS: 2.5000 mg | ORAL_TABLET | Freq: Every day | ORAL | Status: AC

## 2014-12-02 MED ORDER — FUROSEMIDE 40 MG PO TABS
40.0000 mg | ORAL_TABLET | Freq: Three times a day (TID) | ORAL | Status: DC
  Administered 2014-12-02: 40 mg via ORAL

## 2014-12-02 MED ORDER — MAGIC MOUTHWASH W/LIDOCAINE: 15.0000 mL | Freq: Three times a day (TID) | ORAL | Status: AC | PRN

## 2014-12-02 MED ORDER — DILTIAZEM HCL ER COATED BEADS 240 MG PO CP24: 240.0000 mg | ORAL_CAPSULE | Freq: Every day | ORAL | Status: DC

## 2014-12-02 MED ORDER — ESCITALOPRAM OXALATE 20 MG PO TABS: 20.0000 mg | ORAL_TABLET | Freq: Every day | ORAL | Status: AC

## 2014-12-02 MED ORDER — PREDNISONE 5 MG PO TABS
40.0000 mg | ORAL_TABLET | Freq: Every day | ORAL | Status: DC
  Administered 2014-12-02: 40 mg via ORAL
  Filled 2014-12-02 (×2): qty 8

## 2014-12-02 MED ORDER — GABAPENTIN 300 MG PO CAPS: 300.0000 mg | ORAL_CAPSULE | Freq: Every day | ORAL | Status: AC

## 2014-12-02 MED ORDER — WARFARIN SODIUM 2.5 MG PO TABS
2.5000 mg | ORAL_TABLET | Freq: Every day | ORAL | Status: DC
  Administered 2014-12-02: 2.5 mg via ORAL
  Filled 2014-12-02: qty 1

## 2014-12-02 MED ORDER — PREDNISONE 5 MG PO TABS: 5.0000 mg | ORAL_TABLET | Freq: Every day | ORAL | Status: DC

## 2014-12-02 MED ORDER — METOLAZONE 5 MG PO TABS: 5.0000 mg | ORAL_TABLET | Freq: Every day | ORAL | Status: AC

## 2014-12-02 MED ORDER — ATORVASTATIN CALCIUM 10 MG PO TABS: 10.0000 mg | ORAL_TABLET | Freq: Every day | ORAL | Status: DC

## 2014-12-02 MED ORDER — PREDNISONE 5 MG PO TABS: ORAL_TABLET | ORAL | Status: DC

## 2014-12-02 MED ORDER — POTASSIUM CHLORIDE CRYS ER 20 MEQ PO TBCR
40.0000 meq | EXTENDED_RELEASE_TABLET | Freq: Once | ORAL | Status: AC
  Administered 2014-12-02: 40 meq via ORAL

## 2014-12-02 MED ORDER — FAMOTIDINE 20 MG PO TABS: 20.0000 mg | ORAL_TABLET | Freq: Every day | ORAL | Status: AC

## 2014-12-02 MED ORDER — FUROSEMIDE 40 MG PO TABS: 40.0000 mg | ORAL_TABLET | Freq: Two times a day (BID) | ORAL | Status: DC

## 2014-12-02 MED ORDER — FERROUS SULFATE 325 (65 FE) MG PO TABS: 325.0000 mg | ORAL_TABLET | Freq: Two times a day (BID) | ORAL | Status: AC

## 2014-12-02 MED ORDER — POTASSIUM CHLORIDE CRYS ER 20 MEQ PO TBCR: 40.0000 meq | EXTENDED_RELEASE_TABLET | Freq: Two times a day (BID) | ORAL | Status: AC

## 2014-12-02 MED ORDER — ALLOPURINOL 300 MG PO TABS: 300.0000 mg | ORAL_TABLET | Freq: Every day | ORAL | Status: AC

## 2014-12-02 NOTE — Progress Notes (Signed)
[facility] PHYSICAL MEDICINE & REHABILITATION     PROGRESS NOTE    Subjective/Complaints: Feeling very well. Pleased with progress. Breathing better. Weight down   ROS: Pt denies fever, rash/itching, headache, blurred or double vision, nausea, vomiting, abdominal pain, diarrhea, chest pain, palpitations, dysuria, dizziness, neck or back pain, bleeding, anxiety, or depression   Objective: Vital Signs: Blood pressure 136/60, pulse 102, temperature 97.9 F (36.6 C), temperature source Oral, resp. rate 20, weight 125.283 kg (276 lb 3.2 oz), SpO2 92 %. No results found. No results for input(s): WBC, HGB, HCT, PLT in the last 72 hours.  Recent Labs  12/01/14 0428 12/02/14 0442  NA 138 137  K 3.2* 2.9*  CL 80* 81*  GLUCOSE 202* 158*  BUN 120* 123*  CREATININE 1.76* 1.95*  CALCIUM 9.8 9.8   CBG (last 3)  No results for input(s): GLUCAP in the last 72 hours.  Wt Readings from Last 3 Encounters:  12/02/14 125.283 kg (276 lb 3.2 oz)  11/24/14 131.2 kg (289 lb 3.9 oz)  10/20/14 133.358 kg (294 lb)    Physical Exam:  Constitutional: She is oriented to person, place, and time.  No distress HENT: oral mucosa pink and moist Head: Normocephalic.  Eyes: EOM are normal.  Neck: Normal range of motion. Neck supple. No thyromegaly present.  Cardiovascular:  Cardiac rate controlled. No murmurs.  Respiratory:  Decreased  air movement overall GI: Soft. Bowel sounds are normal. She exhibits no distension.  Ext: chronic LE edema---1 to 2+ (decreased) Neurological: She is alert and oriented to person, place, and time. No cranial nerve deficit. Coordination normal.  Follows commands. UES 5/5 prox to distal. LES: 2/5 HF, 3/5 KE and 4/5 ankles. Decreased LT bilateral distal legs/feet below knees.  Skin: Skin with chronic changes bilaterally. Multiple scars, wounds on both legs. Numerous bruises and ecchymoses on the UE's.  Psychiatric: She has a normal mood and affect. Her behavior is  normal.    Assessment/Plan: 1. Functional deficits secondary to debility related to acute on chronic respiratory failure which require 3+ hours per day of interdisciplinary therapy in a comprehensive inpatient rehab setting. Physiatrist is providing close team supervision and 24 hour management of active medical problems listed below. Physiatrist and rehab team continue to assess barriers to discharge/monitor patient progress toward functional and medical goals. FIM: FIM - Bathing Bathing Steps Patient Completed: Chest, Right Arm, Left Arm, Abdomen, Front perineal area, Right upper leg, Left upper leg Bathing: 3: Mod-Patient completes 5-7 46f 10 parts or 50-74%  FIM - Upper Body Dressing/Undressing Upper body dressing/undressing steps patient completed: Thread/unthread right sleeve of pullover shirt/dresss, Thread/unthread left sleeve of pullover shirt/dress, Put head through opening of pull over shirt/dress, Pull shirt over trunk Upper body dressing/undressing: 6: More than reasonable amount of time FIM - Lower Body Dressing/Undressing Lower body dressing/undressing steps patient completed:  (declined underware today) Lower body dressing/undressing: 3: Mod-Patient completed 50-74% of tasks  FIM - Toileting Toileting steps completed by patient: Adjust clothing prior to toileting, Performs perineal hygiene, Adjust clothing after toileting Toileting Assistive Devices: Grab bar or rail for support Toileting: 6: Assistive device: No helper  FIM - Diplomatic Services operational officer Devices: Psychiatrist Transfers: 6-Assistive device: No helper, 6-To toilet/ BSC, 6-From toilet/BSC  FIM - Banker Devices: Therapist, occupational: 5: Bed > Chair or W/C: Supervision (verbal cues/safety issues), 5: Chair or W/C > Bed: Supervision (verbal cues/safety issues)  FIM - Locomotion: Wheelchair Distance: 65 Locomotion:  Wheelchair: 2:  Travels 50 - 149 ft with supervision, cueing or coaxing FIM - Locomotion: Ambulation Locomotion: Ambulation Assistive Devices: Designer, industrial/product Ambulation/Gait Assistance: 5: Supervision Locomotion: Ambulation: 2: Travels 50 - 149 ft with supervision/safety issues  Comprehension Comprehension Mode: Auditory Comprehension: 5-Understands complex 90% of the time/Cues < 10% of the time  Expression Expression Mode: Verbal Expression: 5-Expresses complex 90% of the time/cues < 10% of the time  Social Interaction Social Interaction: 6-Interacts appropriately with others with medication or extra time (anti-anxiety, antidepressant).  Problem Solving Problem Solving: 5-Solves complex 90% of the time/cues < 10% of the time  Memory Memory: 6-More than reasonable amt of time  Medical Problem List and Plan:  1. Functional deficits secondary to debilitation related to acute on chronic respiratory failure with pulmonary fibrosis with chronic oxygen therapy/Solu-Medrol per pulmonary services intravenous q12  -solumedrol dc/ed--->prednisone taper --> 40mg  daily for 1 week, 15mg  qd for one month, 10mg  for one month then 5mg  for month and off  -outpt pulmonary follow up at some point  -pulmonary rehab? 2. DVT history/Anticoagulation: Chronic Coumadin therapy.---INR remains subtherapeutic--adjust coumadin today 3. Pain Management: Neurontin 300 mg daily. --good control 4. Mood/depression: Lexapro 20 mg daily. In good spirits currently 5. Neuropsych: This patient is capable of making decisions on her own behalf. 6. Skin/Wound Care: continue local care, pressure relief to multiple areas  7. Fluids/Electrolytes/Nutrition: replace k+ aggressively today after reviewing labs  -daily BMET reviewed today---potassium again low---increase supplementation and re-check bmet at noon  -will need TID potassium at home and follow up of bmet at home pending lab result today 8. Hypertension/atrial fibrillation.  Cardizem 240 mg daily, Zaroxolyn 5 mg daily 9. Diastolic congestive heart failure. Lasix decreased to bid (for ?1 week) and zaroxolyn daily.  -continue i's and o's, weights (125kg)  -resumed potassium supplementation  -follow up with pcp regarding longer term lasix dosing after discharge  10. MRSA PCR screening positive. Contact precautions 11. Hyperlipidemia. Lipitor 12. Chronic renal insufficiency. Baseline creatinine 1.19-1.61---   LOS (Days) 8 A FACE TO FACE EVALUATION WAS PERFORMED  Demeka Sutter T 12/02/2014 7:59 AM

## 2014-12-02 NOTE — Progress Notes (Signed)
Physical Therapy Discharge Summary  Patient Details  Name: [name] [name] MRN: [id number] Date of Birth: 19-Dec-1931    Patient has met 5 of 5 long term goals due to improved activity tolerance, improved balance and increased strength.  Patient to discharge at an ambulatory level Supervision.   Patient's care partner requires assistance to provide the necessary physical assistance at discharge.   Recommendation:  Patient will benefit from ongoing skilled PT services in home health setting to continue to advance safe functional mobility, address ongoing impairments in endurance, strength, breathing, functional mobility, safety, and minimize fall risk.  Equipment: wheelchair  Reasons for discharge: treatment goals met  Patient/family agrees with progress made and goals achieved: Yes  PT Discharge    Cognition Overall Cognitive Status: Within Functional Limits for tasks assessed Sensation Light Touch: Appears Intact Motor  Motor Motor: Abnormal postural alignment and control Motor - Skilled Clinical Observations: kyphotic with flexed posture in standing  Mobility Transfers Sit to Stand: 6: Modified independent (Device/Increase time) Stand to Sit: 6: Modified independent (Device/Increase time) Stand Pivot Transfers: 6: Modified independent (Device/Increase time) Locomotion  Ambulation Ambulation/Gait Assistance: 5: Supervision Ambulation Distance (Feet): 50 Feet Assistive device: Rolling walker Ambulation/Gait Assistance Details: verbal cuers for posture, breathing, O2 line management  Stairs / Additional Locomotion Stairs Assistance: 5: Supervision Stair Management Technique: One rail Right Number of Stairs: 4 Wheelchair Mobility Wheelchair Assistance: 5: Supervision Distance: 65 Tourist information centre manager Standing - Balance Support: Bilateral upper extremity supported Static Standing - Level of Assistance: 6: Modified independent (Device/Increase  time) Dynamic Standing Balance Dynamic Standing - Balance Support: Left upper extremity supported;During functional activity Dynamic Standing - Level of Assistance: 5: Stand by assistance Dynamic Standing - Balance Activities: Reaching for objects Extremity Assessment      RLE Assessment RLE Assessment: Within Functional Limits LLE Assessment LLE Assessment: Within Functional Limits  See FIM for current functional status  Retta Diones 12/02/2014, 10:18 AM

## 2014-12-02 NOTE — Telephone Encounter (Signed)
Transition Care Management Follow-up Telephone Call   Date discharged? 12/02/14   How have you been since you were released from the hospital? Much improved    Do you understand why you were in the hospital? yes   Do you understand the discharge instructions? yes   Where were you discharged to? Home   Items Reviewed:  Medications reviewed: no  Allergies reviewed: yes  Dietary changes reviewed: n/a  Referrals reviewed: yes, cardiology-scheduled   Functional Questionnaire:   Activities of Daily Living (ADLs):   She states they are independent in the following: feeding, continence and grooming States they require assistance with the following: ambulation, bathing and hygiene, toileting and dressing   Any transportation issues/concerns?: no   Any patient concerns? yes   Confirmed importance and date/time of follow-up visits scheduled yes, 12/08/14 @ 1145  Provider Appointment booked with Kerby Nora, MD  Confirmed with patient if condition begins to worsen call PCP or go to the ER.  Patient was given the office number and encouraged to call back with question or concerns.  : yes

## 2014-12-02 NOTE — Progress Notes (Signed)
ANTICOAGULATION CONSULT NOTE - Follow Up Consult  Pharmacy Consult for coumadin Indication: hx of afib and DVT/PE  Allergies  Allergen Reactions  . Pirfenidone Other (See Comments)    Congestion, breathing diffulty    Patient Measurements: Weight: 276 lb 3.2 oz (125.283 kg)  Vital Signs: Temp: 97.9 F (36.6 C) (07/22 0552) Temp Source: Oral (07/22 0552) BP: 136/60 mmHg (07/22 0741) Pulse Rate: 102 (07/22 0552)  Labs:  Recent Labs  11/30/14 0416 11/30/14 0911 12/01/14 0428 12/02/14 0442  LABPROT 19.6*  --  20.3* 21.2*  INR 1.65*  --  1.73* 1.84*  CREATININE  --  1.91* 1.76* 1.95*    Estimated Creatinine Clearance: 29.6 mL/min (by C-G formula based on Cr of 1.95).   Medications:  Scheduled:  . albuterol  2.5 mg Nebulization TID  . allopurinol  300 mg Oral Daily  . aspirin EC  81 mg Oral Daily  . atorvastatin  10 mg Oral Daily  . budesonide  0.25 mg Nebulization BID  . diltiazem  240 mg Oral Daily  . escitalopram  20 mg Oral Daily  . famotidine  20 mg Oral Daily  . ferrous sulfate  325 mg Oral BID WC  . furosemide  40 mg Oral TID  . gabapentin  300 mg Oral Daily  . loratadine  10 mg Oral Daily  . metolazone  5 mg Oral Daily  . potassium chloride  40 mEq Oral TID  . predniSONE  40 mg Oral Q breakfast  . Warfarin - Pharmacist Dosing Inpatient   Does not apply q1800   Infusions:    Assessment: 81 yo female with hx of afib and DVT/PE is currently on subtherapeutic coumadin.  INR is up to 1.84, at goal range now. Plan for discharge today.  Goal of Therapy:  INR 1.8-2.2 Monitor platelets by anticoagulation protocol: Yes    Plan:  Coumadin 2.5 mg po daily Daily PT/INR  Recommend coumadin 2.5mg  daily after discharge, and f/u PT/INR outpatient.   Bayard Hugger, PharmD, BCPS  Clinical Pharmacist  Pager: [phone]   12/02/2014,12:52 PM

## 2014-12-02 NOTE — Progress Notes (Signed)
Patient given discharge information by Jesusita Oka A.,PA. All questions answered and belongings packed. Patient's son assisted patient to car via wheelchair with belongings. [place]

## 2014-12-03 NOTE — Progress Notes (Signed)
Social Work  Discharge Note  The overall goal for the admission was met for:   Discharge location: Yes - home with family and private duty caregiver providing 24/7 - intermittent support  Length of Stay: Yes - 8 days  Discharge activity level: Yes - supervision - mod independent  Home/community participation: Yes  Services provided included: MD, RD, PT, OT, SLP, RN, TR, Pharmacy and [place]: Medicare  Follow-up services arranged: Home Health: RN, PT via [place], DME: 22x18 lightweight w/c with ELRs, cushion via [place] and Patient/Family has no preference for HH/DME agencies  Comments (or additional information):  Patient/Family verbalized understanding of follow-up arrangements: Yes  Individual responsible for coordination of the follow-up plan: pt  Confirmed correct DME delivered: Joell Usman 12/03/2014    Elye Harmsen

## 2014-12-04 ENCOUNTER — Emergency Department (HOSPITAL_COMMUNITY): Payer: Medicare Other

## 2014-12-04 ENCOUNTER — Inpatient Hospital Stay (HOSPITAL_COMMUNITY)
Admission: EM | Admit: 2014-12-04 | Discharge: 2014-12-29 | DRG: 871 | Disposition: A | Discharge status: Hospice | Payer: Medicare Other | Attending: Internal Medicine | Admitting: Internal Medicine

## 2014-12-04 ENCOUNTER — Encounter (HOSPITAL_COMMUNITY): Payer: Self-pay | Admitting: Nurse Practitioner

## 2014-12-04 DIAGNOSIS — R791 Abnormal coagulation profile: Secondary | ICD-10-CM | POA: Diagnosis present

## 2014-12-04 DIAGNOSIS — E875 Hyperkalemia: Secondary | ICD-10-CM | POA: Diagnosis not present

## 2014-12-04 DIAGNOSIS — Z8249 Family history of ischemic heart disease and other diseases of the circulatory system: Secondary | ICD-10-CM

## 2014-12-04 DIAGNOSIS — R0602 Shortness of breath: Secondary | ICD-10-CM | POA: Diagnosis not present

## 2014-12-04 DIAGNOSIS — N390 Urinary tract infection, site not specified: Secondary | ICD-10-CM | POA: Diagnosis present

## 2014-12-04 DIAGNOSIS — Z7952 Long term (current) use of systemic steroids: Secondary | ICD-10-CM

## 2014-12-04 DIAGNOSIS — Z86711 Personal history of pulmonary embolism: Secondary | ICD-10-CM | POA: Diagnosis not present

## 2014-12-04 DIAGNOSIS — N183 Chronic kidney disease, stage 3 unspecified: Secondary | ICD-10-CM | POA: Diagnosis present

## 2014-12-04 DIAGNOSIS — J9621 Acute and chronic respiratory failure with hypoxia: Secondary | ICD-10-CM | POA: Diagnosis present

## 2014-12-04 DIAGNOSIS — I2510 Atherosclerotic heart disease of native coronary artery without angina pectoris: Secondary | ICD-10-CM | POA: Diagnosis present

## 2014-12-04 DIAGNOSIS — Z86718 Personal history of other venous thrombosis and embolism: Secondary | ICD-10-CM | POA: Diagnosis not present

## 2014-12-04 DIAGNOSIS — Z79899 Other long term (current) drug therapy: Secondary | ICD-10-CM | POA: Diagnosis not present

## 2014-12-04 DIAGNOSIS — I5032 Chronic diastolic (congestive) heart failure: Secondary | ICD-10-CM | POA: Diagnosis not present

## 2014-12-04 DIAGNOSIS — Z66 Do not resuscitate: Secondary | ICD-10-CM | POA: Diagnosis not present

## 2014-12-04 DIAGNOSIS — A4151 Sepsis due to Escherichia coli [E. coli]: Secondary | ICD-10-CM | POA: Diagnosis not present

## 2014-12-04 DIAGNOSIS — Z7901 Long term (current) use of anticoagulants: Secondary | ICD-10-CM

## 2014-12-04 DIAGNOSIS — F329 Major depressive disorder, single episode, unspecified: Secondary | ICD-10-CM | POA: Diagnosis present

## 2014-12-04 DIAGNOSIS — I129 Hypertensive chronic kidney disease with stage 1 through stage 4 chronic kidney disease, or unspecified chronic kidney disease: Secondary | ICD-10-CM | POA: Diagnosis present

## 2014-12-04 DIAGNOSIS — K219 Gastro-esophageal reflux disease without esophagitis: Secondary | ICD-10-CM | POA: Diagnosis present

## 2014-12-04 DIAGNOSIS — R32 Unspecified urinary incontinence: Secondary | ICD-10-CM | POA: Diagnosis present

## 2014-12-04 DIAGNOSIS — Z7982 Long term (current) use of aspirin: Secondary | ICD-10-CM | POA: Diagnosis not present

## 2014-12-04 DIAGNOSIS — B370 Candidal stomatitis: Secondary | ICD-10-CM | POA: Diagnosis present

## 2014-12-04 DIAGNOSIS — A4159 Other Gram-negative sepsis: Secondary | ICD-10-CM | POA: Diagnosis present

## 2014-12-04 DIAGNOSIS — I714 Abdominal aortic aneurysm, without rupture: Secondary | ICD-10-CM | POA: Diagnosis present

## 2014-12-04 DIAGNOSIS — B964 Proteus (mirabilis) (morganii) as the cause of diseases classified elsewhere: Secondary | ICD-10-CM | POA: Diagnosis present

## 2014-12-04 DIAGNOSIS — I4819 Other persistent atrial fibrillation: Secondary | ICD-10-CM | POA: Insufficient documentation

## 2014-12-04 DIAGNOSIS — Z6841 Body Mass Index (BMI) 40.0 and over, adult: Secondary | ICD-10-CM

## 2014-12-04 DIAGNOSIS — R531 Weakness: Secondary | ICD-10-CM | POA: Diagnosis not present

## 2014-12-04 DIAGNOSIS — E876 Hypokalemia: Secondary | ICD-10-CM | POA: Diagnosis present

## 2014-12-04 DIAGNOSIS — I5031 Acute diastolic (congestive) heart failure: Secondary | ICD-10-CM | POA: Diagnosis not present

## 2014-12-04 DIAGNOSIS — I481 Persistent atrial fibrillation: Secondary | ICD-10-CM | POA: Diagnosis present

## 2014-12-04 DIAGNOSIS — M109 Gout, unspecified: Secondary | ICD-10-CM | POA: Diagnosis present

## 2014-12-04 DIAGNOSIS — J84112 Idiopathic pulmonary fibrosis: Secondary | ICD-10-CM | POA: Diagnosis present

## 2014-12-04 DIAGNOSIS — Z9981 Dependence on supplemental oxygen: Secondary | ICD-10-CM | POA: Diagnosis not present

## 2014-12-04 DIAGNOSIS — A419 Sepsis, unspecified organism: Secondary | ICD-10-CM | POA: Diagnosis not present

## 2014-12-04 DIAGNOSIS — R159 Full incontinence of feces: Secondary | ICD-10-CM | POA: Diagnosis present

## 2014-12-04 DIAGNOSIS — J449 Chronic obstructive pulmonary disease, unspecified: Secondary | ICD-10-CM | POA: Diagnosis present

## 2014-12-04 DIAGNOSIS — R627 Adult failure to thrive: Secondary | ICD-10-CM | POA: Diagnosis present

## 2014-12-04 DIAGNOSIS — Z888 Allergy status to other drugs, medicaments and biological substances status: Secondary | ICD-10-CM | POA: Diagnosis not present

## 2014-12-04 DIAGNOSIS — Z515 Encounter for palliative care: Secondary | ICD-10-CM | POA: Diagnosis not present

## 2014-12-04 DIAGNOSIS — J9690 Respiratory failure, unspecified, unspecified whether with hypoxia or hypercapnia: Secondary | ICD-10-CM | POA: Insufficient documentation

## 2014-12-04 DIAGNOSIS — I482 Chronic atrial fibrillation: Secondary | ICD-10-CM | POA: Diagnosis not present

## 2014-12-04 DIAGNOSIS — G4733 Obstructive sleep apnea (adult) (pediatric): Secondary | ICD-10-CM | POA: Diagnosis present

## 2014-12-04 DIAGNOSIS — IMO0001 Reserved for inherently not codable concepts without codable children: Secondary | ICD-10-CM | POA: Insufficient documentation

## 2014-12-04 DIAGNOSIS — I5033 Acute on chronic diastolic (congestive) heart failure: Secondary | ICD-10-CM | POA: Diagnosis present

## 2014-12-04 DIAGNOSIS — R5381 Other malaise: Secondary | ICD-10-CM | POA: Diagnosis present

## 2014-12-04 DIAGNOSIS — E785 Hyperlipidemia, unspecified: Secondary | ICD-10-CM | POA: Diagnosis present

## 2014-12-04 DIAGNOSIS — B961 Klebsiella pneumoniae [K. pneumoniae] as the cause of diseases classified elsewhere: Secondary | ICD-10-CM | POA: Diagnosis present

## 2014-12-04 DIAGNOSIS — J849 Interstitial pulmonary disease, unspecified: Secondary | ICD-10-CM | POA: Diagnosis not present

## 2014-12-04 DIAGNOSIS — I4891 Unspecified atrial fibrillation: Secondary | ICD-10-CM

## 2014-12-04 DIAGNOSIS — R0902 Hypoxemia: Secondary | ICD-10-CM

## 2014-12-04 DIAGNOSIS — Z22322 Carrier or suspected carrier of Methicillin resistant Staphylococcus aureus: Secondary | ICD-10-CM | POA: Diagnosis not present

## 2014-12-04 HISTORY — DX: Chronic diastolic (congestive) heart failure: I50.32

## 2014-12-04 LAB — COMPREHENSIVE METABOLIC PANEL
ALT: 69 U/L — ABNORMAL HIGH (ref 14–54)
ALT: 61 U/L — AB (ref 14–54)
ANION GAP: 12 (ref 5–15)
AST: 44 U/L — AB (ref 15–41)
AST: 43 U/L — ABNORMAL HIGH (ref 15–41)
Albumin: 3.7 g/dL (ref 3.5–5.0)
Albumin: 3.3 g/dL — ABNORMAL LOW (ref 3.5–5.0)
Alkaline Phosphatase: 63 U/L (ref 38–126)
Alkaline Phosphatase: 58 U/L (ref 38–126)
Anion gap: 15 (ref 5–15)
BILIRUBIN TOTAL: 1 mg/dL (ref 0.3–1.2)
BUN: 87 mg/dL — ABNORMAL HIGH (ref 6–20)
BUN: 79 mg/dL — AB (ref 6–20)
CO2: 37 mmol/L — ABNORMAL HIGH (ref 22–32)
CO2: 36 mmol/L — AB (ref 22–32)
Calcium: 10.5 mg/dL — ABNORMAL HIGH (ref 8.9–10.3)
Calcium: 9.2 mg/dL (ref 8.9–10.3)
Chloride: 86 mmol/L — ABNORMAL LOW (ref 101–111)
Chloride: 85 mmol/L — ABNORMAL LOW (ref 101–111)
Creatinine, Ser: 1.57 mg/dL — ABNORMAL HIGH (ref 0.44–1.00)
Creatinine, Ser: 1.42 mg/dL — ABNORMAL HIGH (ref 0.44–1.00)
GFR calc Af Amer: 35 mL/min — ABNORMAL LOW (ref >60)
GFR calc non Af Amer: 34 mL/min — ABNORMAL LOW (ref >60)
GFR, EST AFRICAN AMERICAN: 39 mL/min — AB (ref >60)
GFR, EST NON AFRICAN AMERICAN: 30 mL/min — AB (ref >60)
GLUCOSE: 210 mg/dL — AB (ref 65–99)
Glucose, Bld: 153 mg/dL — ABNORMAL HIGH (ref 65–99)
POTASSIUM: 3.9 mmol/L (ref 3.5–5.1)
Potassium: 4 mmol/L (ref 3.5–5.1)
SODIUM: 138 mmol/L (ref 135–145)
Sodium: 133 mmol/L — ABNORMAL LOW (ref 135–145)
Total Bilirubin: 1.4 mg/dL — ABNORMAL HIGH (ref 0.3–1.2)
Total Protein: 6.8 g/dL (ref 6.5–8.1)
Total Protein: 6.1 g/dL — ABNORMAL LOW (ref 6.5–8.1)

## 2014-12-04 LAB — CBC WITH DIFFERENTIAL/PLATELET
BASOS ABS: 0 10*3/uL (ref 0.0–0.1)
Basophils Relative: 0 % (ref 0–1)
EOS ABS: 0 10*3/uL (ref 0.0–0.7)
Eosinophils Relative: 0 % (ref 0–5)
HCT: 48.2 % — ABNORMAL HIGH (ref 36.0–46.0)
Hemoglobin: 15.7 g/dL — ABNORMAL HIGH (ref 12.0–15.0)
Lymphocytes Relative: 3 % — ABNORMAL LOW (ref 12–46)
Lymphs Abs: 0.7 10*3/uL (ref 0.7–4.0)
MCH: 33 pg (ref 26.0–34.0)
MCHC: 32.6 g/dL (ref 30.0–36.0)
MCV: 101.3 fL — ABNORMAL HIGH (ref 78.0–100.0)
Monocytes Absolute: 0.4 10*3/uL (ref 0.1–1.0)
Monocytes Relative: 2 % — ABNORMAL LOW (ref 3–12)
NEUTROS PCT: 95 % — AB (ref 43–77)
Neutro Abs: 21 10*3/uL — ABNORMAL HIGH (ref 1.7–7.7)
PLATELETS: 180 10*3/uL (ref 150–400)
RBC: 4.76 MIL/uL (ref 3.87–5.11)
RDW: 15.6 % — ABNORMAL HIGH (ref 11.5–15.5)
WBC: 22.1 10*3/uL — ABNORMAL HIGH (ref 4.0–10.5)

## 2014-12-04 LAB — URINALYSIS, ROUTINE W REFLEX MICROSCOPIC
BILIRUBIN URINE: NEGATIVE
Glucose, UA: NEGATIVE mg/dL
Ketones, ur: NEGATIVE mg/dL
Nitrite: NEGATIVE
Protein, ur: NEGATIVE mg/dL
SPECIFIC GRAVITY, URINE: 1.011 (ref 1.005–1.030)
Urobilinogen, UA: 0.2 mg/dL (ref 0.0–1.0)
pH: 7.5 (ref 5.0–8.0)

## 2014-12-04 LAB — CBC
HEMATOCRIT: 51.4 % — AB (ref 36.0–46.0)
Hemoglobin: 17.2 g/dL — ABNORMAL HIGH (ref 12.0–15.0)
MCH: 33.7 pg (ref 26.0–34.0)
MCHC: 33.5 g/dL (ref 30.0–36.0)
MCV: 100.6 fL — AB (ref 78.0–100.0)
Platelets: 185 10*3/uL (ref 150–400)
RBC: 5.11 MIL/uL (ref 3.87–5.11)
RDW: 15.6 % — AB (ref 11.5–15.5)
WBC: 27.5 10*3/uL — AB (ref 4.0–10.5)

## 2014-12-04 LAB — I-STAT TROPONIN, ED: Troponin i, poc: 0.1 ng/mL (ref 0.00–0.08)

## 2014-12-04 LAB — APTT: aPTT: 28 seconds (ref 24–37)

## 2014-12-04 LAB — URINE MICROSCOPIC-ADD ON

## 2014-12-04 LAB — I-STAT CG4 LACTIC ACID, ED: Lactic Acid, Venous: 4.15 mmol/L (ref 0.5–2.0)

## 2014-12-04 LAB — LACTIC ACID, PLASMA
LACTIC ACID, VENOUS: 3 mmol/L — AB (ref 0.5–2.0)
Lactic Acid, Venous: 2 mmol/L (ref 0.5–2.0)

## 2014-12-04 LAB — PROTIME-INR
INR: 2.13 — AB (ref 0.00–1.49)
INR: 2.13 — ABNORMAL HIGH (ref 0.00–1.49)
PROTHROMBIN TIME: 23.6 s — AB (ref 11.6–15.2)
Prothrombin Time: 23.7 seconds — ABNORMAL HIGH (ref 11.6–15.2)

## 2014-12-04 LAB — PROCALCITONIN: PROCALCITONIN: 0.17 ng/mL

## 2014-12-04 LAB — MRSA PCR SCREENING: MRSA BY PCR: POSITIVE — AB

## 2014-12-04 LAB — BRAIN NATRIURETIC PEPTIDE: B Natriuretic Peptide: 280.1 pg/mL — ABNORMAL HIGH (ref 0.0–100.0)

## 2014-12-04 MED ORDER — ESCITALOPRAM OXALATE 20 MG PO TABS
20.0000 mg | ORAL_TABLET | Freq: Every day | ORAL | Status: DC
  Administered 2014-12-05 – 2014-12-29 (×25): 20 mg via ORAL
  Filled 2014-12-04 (×11): qty 1
  Filled 2014-12-04: qty 2
  Filled 2014-12-04 (×14): qty 1

## 2014-12-04 MED ORDER — ACETAMINOPHEN 325 MG PO TABS
650.0000 mg | ORAL_TABLET | Freq: Four times a day (QID) | ORAL | Status: DC | PRN
  Administered 2014-12-11 – 2014-12-26 (×6): 650 mg via ORAL
  Filled 2014-12-04 (×6): qty 2

## 2014-12-04 MED ORDER — ONDANSETRON HCL 4 MG PO TABS: 4.0000 mg | ORAL_TABLET | Freq: Four times a day (QID) | ORAL | Status: DC | PRN

## 2014-12-04 MED ORDER — OXYCODONE HCL 5 MG PO TABS
5.0000 mg | ORAL_TABLET | ORAL | Status: DC | PRN
  Administered 2014-12-16: 5 mg via ORAL
  Filled 2014-12-04: qty 1

## 2014-12-04 MED ORDER — SODIUM CHLORIDE 0.9 % IJ SOLN: 3.0000 mL | INTRAMUSCULAR | Status: DC | PRN

## 2014-12-04 MED ORDER — VANCOMYCIN HCL IN DEXTROSE 1-5 GM/200ML-% IV SOLN: 1000.0000 mg | Freq: Once | INTRAVENOUS | Status: DC

## 2014-12-04 MED ORDER — WARFARIN - PHARMACIST DOSING INPATIENT
Freq: Every day | Status: DC
  Administered 2014-12-05 – 2014-12-06 (×2)
  Administered 2014-12-09: 1
  Administered 2014-12-10: 18:00:00
  Administered 2014-12-11: 1
  Administered 2014-12-13 – 2014-12-25 (×5)
  Administered 2014-12-27: 1

## 2014-12-04 MED ORDER — GABAPENTIN 300 MG PO CAPS
300.0000 mg | ORAL_CAPSULE | Freq: Every day | ORAL | Status: DC
  Administered 2014-12-05 – 2014-12-29 (×25): 300 mg via ORAL
  Filled 2014-12-04 (×27): qty 1

## 2014-12-04 MED ORDER — DEXTROSE 5 % IV SOLN: 2.0000 g | Freq: Three times a day (TID) | INTRAVENOUS | Status: DC

## 2014-12-04 MED ORDER — ACETAMINOPHEN 650 MG RE SUPP
650.0000 mg | Freq: Four times a day (QID) | RECTAL | Status: DC | PRN
  Filled 2014-12-04: qty 1

## 2014-12-04 MED ORDER — SODIUM CHLORIDE 0.9 % IJ SOLN
3.0000 mL | Freq: Two times a day (BID) | INTRAMUSCULAR | Status: DC
  Administered 2014-12-04 – 2014-12-09 (×10): 3 mL via INTRAVENOUS
  Administered 2014-12-10: 09:00:00 via INTRAVENOUS
  Administered 2014-12-11 – 2014-12-29 (×26): 3 mL via INTRAVENOUS

## 2014-12-04 MED ORDER — METHYLPREDNISOLONE SODIUM SUCC 40 MG IJ SOLR
40.0000 mg | Freq: Two times a day (BID) | INTRAMUSCULAR | Status: DC
  Administered 2014-12-04 – 2014-12-06 (×4): 40 mg via INTRAVENOUS
  Filled 2014-12-04 (×6): qty 1

## 2014-12-04 MED ORDER — ONDANSETRON HCL 4 MG/2ML IJ SOLN: 4.0000 mg | Freq: Four times a day (QID) | INTRAMUSCULAR | Status: DC | PRN

## 2014-12-04 MED ORDER — SODIUM CHLORIDE 0.9 % IV BOLUS (SEPSIS)
500.0000 mL | Freq: Once | INTRAVENOUS | Status: AC
  Administered 2014-12-04: 500 mL via INTRAVENOUS

## 2014-12-04 MED ORDER — SODIUM CHLORIDE 0.9 % IV SOLN: 250.0000 mL | INTRAVENOUS | Status: DC | PRN

## 2014-12-04 MED ORDER — PIPERACILLIN-TAZOBACTAM 3.375 G IVPB
3.3750 g | Freq: Three times a day (TID) | INTRAVENOUS | Status: DC
  Filled 2014-12-04 (×2): qty 50

## 2014-12-04 MED ORDER — ATORVASTATIN CALCIUM 10 MG PO TABS
10.0000 mg | ORAL_TABLET | Freq: Every day | ORAL | Status: DC
  Administered 2014-12-05 – 2014-12-29 (×25): 10 mg via ORAL
  Filled 2014-12-04 (×27): qty 1

## 2014-12-04 MED ORDER — SODIUM CHLORIDE 0.9 % IJ SOLN
3.0000 mL | Freq: Two times a day (BID) | INTRAMUSCULAR | Status: DC
  Administered 2014-12-04 – 2014-12-29 (×44): 3 mL via INTRAVENOUS

## 2014-12-04 MED ORDER — CEFTAZIDIME 2 G IJ SOLR
2.0000 g | Freq: Two times a day (BID) | INTRAMUSCULAR | Status: DC
  Administered 2014-12-04 – 2014-12-07 (×6): 2 g via INTRAVENOUS
  Filled 2014-12-04 (×7): qty 2

## 2014-12-04 MED ORDER — PIPERACILLIN-TAZOBACTAM 3.375 G IVPB 30 MIN
3.3750 g | Freq: Once | INTRAVENOUS | Status: AC
  Administered 2014-12-04: 3.375 g via INTRAVENOUS
  Filled 2014-12-04: qty 50

## 2014-12-04 MED ORDER — DILTIAZEM HCL 25 MG/5ML IV SOLN
10.0000 mg | Freq: Once | INTRAVENOUS | Status: AC
  Administered 2014-12-04: 10 mg via INTRAVENOUS
  Filled 2014-12-04: qty 5

## 2014-12-04 MED ORDER — DILTIAZEM HCL 100 MG IV SOLR
5.0000 mg/h | Freq: Once | INTRAVENOUS | Status: AC
  Administered 2014-12-04: 5 mg/h via INTRAVENOUS

## 2014-12-04 MED ORDER — ALLOPURINOL 300 MG PO TABS
300.0000 mg | ORAL_TABLET | Freq: Every day | ORAL | Status: DC
  Administered 2014-12-05 – 2014-12-29 (×25): 300 mg via ORAL
  Filled 2014-12-04 (×5): qty 1
  Filled 2014-12-04: qty 3
  Filled 2014-12-04 (×21): qty 1

## 2014-12-04 MED ORDER — VANCOMYCIN HCL 10 G IV SOLR
2000.0000 mg | Freq: Once | INTRAVENOUS | Status: DC
  Administered 2014-12-04: 2000 mg via INTRAVENOUS
  Filled 2014-12-04: qty 2000

## 2014-12-04 MED ORDER — DILTIAZEM HCL 100 MG IV SOLR
5.0000 mg/h | INTRAVENOUS | Status: DC
  Administered 2014-12-04 – 2014-12-06 (×6): 15 mg/h via INTRAVENOUS
  Filled 2014-12-04: qty 100

## 2014-12-04 MED ORDER — VANCOMYCIN HCL 10 G IV SOLR: 1250.0000 mg | INTRAVENOUS | Status: DC

## 2014-12-04 MED ORDER — DILTIAZEM HCL 25 MG/5ML IV SOLN
5.0000 mg | Freq: Once | INTRAVENOUS | Status: AC
  Administered 2014-12-04: 5 mg via INTRAVENOUS

## 2014-12-04 MED ORDER — BUDESONIDE 0.25 MG/2ML IN SUSP
0.2500 mg | Freq: Two times a day (BID) | RESPIRATORY_TRACT | Status: DC
Start: 2014-12-04 — End: 2014-12-13
  Administered 2014-12-04 – 2014-12-13 (×17): 0.25 mg via RESPIRATORY_TRACT
  Filled 2014-12-04 (×24): qty 2

## 2014-12-04 MED ORDER — ASPIRIN EC 81 MG PO TBEC
81.0000 mg | DELAYED_RELEASE_TABLET | Freq: Every day | ORAL | Status: DC
  Administered 2014-12-05 – 2014-12-09 (×5): 81 mg via ORAL
  Filled 2014-12-04 (×6): qty 1

## 2014-12-04 MED ORDER — WARFARIN SODIUM 2.5 MG PO TABS
2.5000 mg | ORAL_TABLET | Freq: Every day | ORAL | Status: DC
Start: 2014-12-04 — End: 2014-12-04

## 2014-12-04 NOTE — Progress Notes (Signed)
RT applied BiPAP via [facility] with 5 L O2 bleed in.  Patient is tolerating at this time. RT will continue to monitor.     12/04/14 2318  BiPAP/CPAP/SIPAP  BiPAP/CPAP/SIPAP Pt Type Adult  Mask Type Full face mask  Mask Size Large  Respiratory Rate 25 breaths/min  IPAP 14 cmH20  EPAP 7 cmH2O  Oxygen Percent 40 %  Flow Rate 5 lpm  BiPAP/CPAP/SIPAP BiPAP  Patient Home Equipment No  Auto Titrate No  BiPAP/CPAP /SiPAP Vitals  Pulse Rate 93  Resp (!) 29  SpO2 95 %  Bilateral Breath Sounds Diminished

## 2014-12-04 NOTE — ED Provider Notes (Signed)
CSN: [id number]     Arrival date & time 12/04/14  1333 History   First MD Initiated Contact with Patient 12/04/14 1339     Chief Complaint  Patient presents with  . Urinary Incontinence     (Consider location/radiation/quality/duration/timing/severity/associated sxs/prior Treatment) HPI [name] [name] is a 81 y.o. female with multiple medical problems including CHF, PE, morbid obesity, coronary artery disease, end-stage interstitial lung disease, chronic kidney disease, presents to emergency department complaining of generalized weakness and urinary incontinence. Patient was recently admitted for hypoxia, was discharged to rehabilitation where she spent a week. The patient was discharged 2 days ago, states at that time she was already starting to feel worse. She reports worsening weakness, more difficulty walking, fatigue. States since being home she has also developed some urinary incontinence, but states it may be an issue where she cannot get to the bathroom fast enough. She states she normally uses bedside commode, but even when she has an urge to go, she does not have enough time to get to the commode and she has been soiling her bed. Son states that she has also had fecal incontinence in bed as well. He is concerned because she has more difficulty walking due to weakness. She denies any fever or chills. States she has been having elevated heart rate, states her M.D. new about her prior to her discharge but had did not do anything about it. She denies any chest pain or shortness of breath. She denies any abdominal pain. She denies any other complaints at this time.  Past Medical History  Diagnosis Date  . Congestive heart failure, unspecified     a. ECHO 6/0: EF 55-60%, mild LVH, mildly dilated RV w. mildly dec fx, RVSP 67  . Chronic atrial fibrillation     failed cardioversion in past  -repeat DC-CV on October 5th, 2010  . Personal history of DVT (deep vein thrombosis) 2008    LLE  . HTN  (hypertension)   . Morbid obesity   . Shingles     with postherpetic neuraigia  . GERD (gastroesophageal reflux disease)   . Hyperlipidemia   . Meningitis ~ 1960    hospitalized @ [facility]  . Gallstones     s/p cholecystectomy  . CAD (coronary artery disease)     nonobstructive by cath 8/10 - LAD 40-50% w mild PAH mean 29 w PVR 3.2 [place]  . PE (pulmonary embolism) ~ 2010    LLE  . Shortness of breath dyspnea   . Lung interstitial disease   . Hypoxemia   . COPD (chronic obstructive pulmonary disease)   . On home oxygen therapy     "5L; 24/7" (10/11/2014)  . OSA treated with BiPAP     "I think it's BiPAP"  . AAA (abdominal aortic aneurysm)     small  . History of blood transfusion   . Anemia   . Iron deficiency anemia   . History of blood transfusion     "related to nose bleed"  . Gout   . Depression   . Chronic kidney disease (CKD), stage II (mild)    Past Surgical History  Procedure Laterality Date  . Appendectomy  1947  . Tonsillectomy  1946  . Cardioversion  01/2007    x2  . Right heart catheterization N/A 02/28/2014    Procedure: RIGHT HEART CATH;  Surgeon: Laurey Morale, MD;  Location: [facility] CATH LAB;  Service: Cardiovascular;  Laterality: N/A;  . Cardiac catheterization  02/2014  .  Cataract extraction w/ intraocular lens  implant, bilateral Bilateral    Family History  Problem Relation Age of Onset  . Heart failure Mother   . Depression Mother   . Ovarian cancer Paternal Grandmother   . Heart disease Paternal Grandfather   . Obesity Other    History  Substance Use Topics  . Smoking status: Never Smoker   . Smokeless tobacco: Never Used  . Alcohol Use: Yes     Comment: 10/11/2014 "used to have a drink on holidays; don't drink at all now"   OB History    No data available     Review of Systems  Constitutional: Positive for fatigue. Negative for fever and chills.  Respiratory: Negative for cough, chest tightness and shortness of breath.    Cardiovascular: Negative for chest pain, palpitations and leg swelling.  Gastrointestinal: Negative for nausea, vomiting, abdominal pain and diarrhea.  Genitourinary: Negative for dysuria, hematuria, flank pain, difficulty urinating and pelvic pain.  Musculoskeletal: Negative for myalgias, arthralgias, neck pain and neck stiffness.  Skin: Negative for rash.  Neurological: Positive for weakness. Negative for dizziness and headaches.  All other systems reviewed and are negative.     Allergies  Pirfenidone  Home Medications   Prior to Admission medications   Medication Sig Start Date End Date Taking? Authorizing Provider  albuterol (PROVENTIL) (2.5 MG/3ML) 0.083% nebulizer solution Take 3 mLs (2.5 mg total) by nebulization every 2 (two) hours as needed for wheezing or shortness of breath. 10/20/14   Richarda Overlie, MD  allopurinol (ZYLOPRIM) 300 MG tablet Take 1 tablet (300 mg total) by mouth daily. 12/02/14   Mcarthur Rossetti Angiulli, PA-C  Alum & Mag Hydroxide-Simeth (MAGIC MOUTHWASH W/LIDOCAINE) SOLN Take 15 mLs by mouth 3 (three) times daily as needed for mouth pain. 12/02/14   Mcarthur Rossetti Angiulli, PA-C  aspirin 81 MG tablet Take 81 mg by mouth daily.    Historical Provider, MD  atorvastatin (LIPITOR) 10 MG tablet Take 1 tablet (10 mg total) by mouth daily. 12/02/14   Mcarthur Rossetti Angiulli, PA-C  budesonide (PULMICORT) 0.25 MG/2ML nebulizer solution Take 0.25 mg by nebulization 2 (two) times daily.    Historical Provider, MD  cetirizine (ZYRTEC ALLERGY) 10 MG tablet Take 10 mg by mouth daily.     Historical Provider, MD  diltiazem (CARDIZEM CD) 240 MG 24 hr capsule Take 1 capsule (240 mg total) by mouth daily. 12/02/14   Mcarthur Rossetti Angiulli, PA-C  escitalopram (LEXAPRO) 20 MG tablet Take 1 tablet (20 mg total) by mouth daily. 12/02/14   Mcarthur Rossetti Angiulli, PA-C  famotidine (PEPCID) 20 MG tablet Take 1 tablet (20 mg total) by mouth daily. 12/02/14   Mcarthur Rossetti Angiulli, PA-C  ferrous sulfate 325 (65 FE) MG tablet  Take 1 tablet (325 mg total) by mouth 2 (two) times daily with a meal. 12/02/14   Mcarthur Rossetti Angiulli, PA-C  furosemide (LASIX) 40 MG tablet Take 1 tablet (40 mg total) by mouth 2 (two) times daily. 12/02/14   Mcarthur Rossetti Angiulli, PA-C  furosemide (LASIX) 80 MG tablet Take 80 mg by mouth 2 (two) times daily. 11/16/14   Historical Provider, MD  gabapentin (NEURONTIN) 300 MG capsule Take 1 capsule (300 mg total) by mouth daily. 12/02/14   Mcarthur Rossetti Angiulli, PA-C  metolazone (ZAROXOLYN) 5 MG tablet Take 1 tablet (5 mg total) by mouth daily. 12/02/14   Mcarthur Rossetti Angiulli, PA-C  Multiple Vitamins-Minerals (MULTIVITAMIN & MINERAL PO) Take 1 tablet by mouth daily.  Historical Provider, MD  nystatin (MYCOSTATIN) powder Apply topically 2 (two) times daily.  10/20/14   Historical Provider, MD  potassium chloride SA (K-DUR,KLOR-CON) 20 MEQ tablet Take 2 tablets (40 mEq total) by mouth 2 (two) times daily. 12/02/14   Mcarthur Rossetti Angiulli, PA-C  prednisoLONE (ORAPRED) 15 MG/5ML solution Take by mouth.  12/03/14   Historical Provider, MD  predniSONE (DELTASONE) 5 MG tablet 8 tablets daily 1 week then 3 tablets daily 1 month then 2 tablets daily 1 month then 1 tablet daily 1 month and stop 12/02/14   Mcarthur Rossetti Angiulli, PA-C  warfarin (COUMADIN) 2.5 MG tablet Take 1 tablet (2.5 mg total) by mouth daily at 6 PM. 12/02/14   Mcarthur Rossetti Angiulli, PA-C   BP 142/72 mmHg  Pulse 57  Temp(Src) 97.6 F (36.4 C) (Oral)  Resp 16  SpO2 96% Physical Exam  Constitutional: She is oriented to person, place, and time. She appears well-developed and well-nourished.  Ill-appearing  HENT:  Head: Normocephalic.  Eyes: Conjunctivae are normal.  Neck: Normal range of motion. Neck supple.  Cardiovascular: Regular rhythm and normal heart sounds.   No murmur heard. Tachycardic, irregular  Pulmonary/Chest: Effort normal and breath sounds normal. She has no wheezes. She has no rales.  Tachypnea  Abdominal: Soft. Bowel sounds are normal. She  exhibits no distension. There is no tenderness. There is no rebound.  Musculoskeletal: She exhibits edema.  Trace LE edema bilaterally  Neurological: She is alert and oriented to person, place, and time. No cranial nerve deficit.  Skin: Skin is warm and dry.  Psychiatric: She has a normal mood and affect. Her behavior is normal.  Nursing note and vitals reviewed.   ED Course  Procedures (including critical care time) Labs Review Labs Reviewed  URINALYSIS, ROUTINE W REFLEX MICROSCOPIC (NOT AT [facility]) - Abnormal; Notable for the following:    APPearance CLOUDY (*)    Hgb urine dipstick TRACE (*)    Leukocytes, UA LARGE (*)    All other components within normal limits  CBC - Abnormal; Notable for the following:    WBC 27.5 (*)    Hemoglobin 17.2 (*)    HCT 51.4 (*)    MCV 100.6 (*)    RDW 15.6 (*)    All other components within normal limits  PROTIME-INR - Abnormal; Notable for the following:    Prothrombin Time 23.6 (*)    INR 2.13 (*)    All other components within normal limits  BRAIN NATRIURETIC PEPTIDE - Abnormal; Notable for the following:    B Natriuretic Peptide 280.1 (*)    All other components within normal limits  COMPREHENSIVE METABOLIC PANEL - Abnormal; Notable for the following:    Chloride 86 (*)    CO2 37 (*)    Glucose, Bld 153 (*)    BUN 87 (*)    Creatinine, Ser 1.57 (*)    Calcium 10.5 (*)    AST 44 (*)    ALT 69 (*)    GFR calc non Af Amer 30 (*)    GFR calc Af Amer 35 (*)    All other components within normal limits  URINE MICROSCOPIC-ADD ON - Abnormal; Notable for the following:    Bacteria, UA FEW (*)    All other components within normal limits  I-STAT TROPOININ, ED - Abnormal; Notable for the following:    Troponin i, poc 0.10 (*)    All other components within normal limits  I-STAT CG4 LACTIC ACID, ED -  Abnormal; Notable for the following:    Lactic Acid, Venous 4.15 (*)    All other components within normal limits  URINE CULTURE  CULTURE,  BLOOD (ROUTINE X 2)  CULTURE, BLOOD (ROUTINE X 2)  I-STAT TROPOININ, ED    Imaging Review Dg Chest Portable 1 View  12/04/2014   CLINICAL DATA:  Weakness, recent hospitalization  EXAM: PORTABLE CHEST - 1 VIEW  COMPARISON:  11/24/2014  FINDINGS: Moderate enlargement of the cardiomediastinal silhouette is noted. Lung volumes are low with marked crowding of the bronchovascular markings but no focal opacity allowing for portable technique. Trace if any pleural fluid. Cardiac leads obscure detail.  IMPRESSION: Low volume exam without focal acute finding. If symptoms persist, consider PA and lateral chest radiographs obtained at full inspiration when the patient is clinically able.   Electronically Signed   By: Christiana Pellant M.D.   On: 12/04/2014 14:27     EKG Interpretation   Date/Time:  Sunday December 04 2014 13:50:05 EDT Ventricular Rate:  137 PR Interval:    QRS Duration: 90 QT Interval:  286 QTC Calculation: 432 R Axis:   4 Text Interpretation:  Atrial fibrillation with rapid V-rate Ventricular  premature complex Repolarization abnormality, prob rate related atrial  fibrillation with rvr Confirmed by Manus Gunning  MD, STEPHEN [phone]) on  12/04/2014 1:59:20 PM      MDM   Final diagnoses:  Sepsis, due to unspecified organism  Atrial fibrillation with RVR  UTI (lower urinary tract infection)  Weakness    Patient just discharged 2 days ago from rehabilitation facility after being treated for weakness and hypoxia, patient with end-stage lung disease. Patient is complaining of generalized weakness. She denies any pain. She denies any worsening of breathing. We'll get labs, chest x-ray, urinalysis.  2:45 PM Patient is tachycardic, A. fib with RVR, will start small bolus of fluids and Cardizem. Patient complains of generalized weakness. She is tachypnea, possibly at baseline given her end-stage pulmonary disease. She is on 5 L of oxygen, which she is on usually at home. Blood pressure is  normal. Sepsis considered given tachycardia, tachypnea, also lactic acid of 4. Will order sepsis protocol antibiotics, this time the source of her infection is unknown. Family informed of the plan.  3:28 PM Patient now received 1 L of normal saline, being careful with fluids given her history. Patient does appear to be dry. Patient is receiving antibiotics, most likely source is urine. Culture sent. Blood culture sent as well. Patient continues to be tachycardic, place and Cardizem drip. Discussed with chart hospitalist they will admit patient.  Filed Vitals:   12/04/14 1338 12/04/14 1350 12/04/14 1400 12/04/14 1446  BP: 142/72 129/87    Pulse: 57 96 98   Temp: 97.6 F (36.4 C)     TempSrc: Oral     Resp: 16 22 27    Weight:    264 lb 6.4 oz (119.931 kg)  SpO2: 96%  92%      Jaynie Crumble, PA-C 12/04/14 1530  Glynn Octave, MD 12/04/14 1857

## 2014-12-04 NOTE — H&P (Addendum)
[facility] History and Physical  PENNELOPE BASQUE RWE:[id number] DOB: 1931-09-15 DOA: 12/04/2014  Referring physician:  PCP: Kerby Nora, MD   Chief Complaint: Generalized weakness  HPI: [name] [name] is a 81 y.o. female with a past medical history of pulmonary fibrosis/interstitial pneumonitis, diastolic congestive heart failure, chronic hypoxemic respiratory failure, atrial fibrillation on chronic anticoagulation who was recently admitted to the pulmonary critical care medicine service on 11/16/2014 discharged on 11/24/2014 to acute rehabilitation.  During the hospitalization she initially presented with acute on chronic respiratory failure with hypoxemia which was believed to be secondary to acute flare up of end-stage interstitial lung disease.  She was treated with IV steroids and broad-spectrum IV antimicrobial therapy.  He was found to have a parainfluenza viral infection.  She showed slow clinical improvement however becoming debilitated from prolonged hospitalization. She was discharged to inpatient rehabilitation on 11/24/2014.  He was discharged home from acute rehabilitation on 12/01/2014.  She presented to the emergency department today with complaints of generalized weakness, malaise, excessive daytime sleepiness, fatigue, having an overall functional decline unable to perform her usual activities of daily living.  She was found to be in A. fib with RVR and started on IV Cardizem.  Patient meeting sepsis criteria having a white count of 27,500, lactate of 4.15, heart rates in the 140s to 150s, respiratory rate of 27.  She was started on broad-spectrum IV microbial therapy with Zosyn and vancomycin.                        Review of Systems:  Constitutional:  No weight loss, night sweats, Fevers, Positive for chills, fatigue, excessive daytime sleepiness HEENT:  No headaches, Difficulty swallowing,Tooth/dental problems,Sore throat,  No sneezing, itching, ear ache, nasal congestion,  post nasal drip,  Cardio-vascular:  No chest pain, Orthopnea, PND, Positive for swelling in lower extremities, anasarca, dizziness, palpitations  GI:  No heartburn, indigestion, abdominal pain, nausea, vomiting, diarrhea, change in bowel habits, loss of appetite  Resp:  Positive for shortness of breath with exertion or at rest. No excess mucus, no productive cough, No non-productive cough, No coughing up of blood.No change in color of mucus.No wheezing.No chest wall deformity  Skin:  no rash or lesions.  GU:  Positive for dysuria, change in color of urine, no urgency or frequency. No flank pain.  Musculoskeletal:  No joint pain or swelling. No decreased range of motion. No back pain.  Psych:  No change in mood or affect. No depression or anxiety. No memory loss.   Past Medical History  Diagnosis Date  . Congestive heart failure, unspecified     a. ECHO 6/0: EF 55-60%, mild LVH, mildly dilated RV w. mildly dec fx, RVSP 67  . Chronic atrial fibrillation     failed cardioversion in past  -repeat DC-CV on October 5th, 2010  . Personal history of DVT (deep vein thrombosis) 2008    LLE  . HTN (hypertension)   . Morbid obesity   . Shingles     with postherpetic neuraigia  . GERD (gastroesophageal reflux disease)   . Hyperlipidemia   . Meningitis ~ 1960    hospitalized @ [facility]  . Gallstones     s/p cholecystectomy  . CAD (coronary artery disease)     nonobstructive by cath 8/10 - LAD 40-50% w mild PAH mean 29 w PVR 3.2 Woods  . PE (pulmonary embolism) ~ 2010    LLE  . Shortness of breath dyspnea   .  Lung interstitial disease   . Hypoxemia   . COPD (chronic obstructive pulmonary disease)   . On home oxygen therapy     "5L; 24/7" (10/11/2014)  . OSA treated with BiPAP     "I think it's BiPAP"  . AAA (abdominal aortic aneurysm)     small  . History of blood transfusion   . Anemia   . Iron deficiency anemia   . History of blood transfusion     "related to nose bleed"  .  Gout   . Depression   . Chronic kidney disease (CKD), stage II (mild)    Past Surgical History  Procedure Laterality Date  . Appendectomy  1947  . Tonsillectomy  1946  . Cardioversion  01/2007    x2  . Right heart catheterization N/A 02/28/2014    Procedure: RIGHT HEART CATH;  Surgeon: Laurey Morale, MD;  Location: [facility] CATH LAB;  Service: Cardiovascular;  Laterality: N/A;  . Cardiac catheterization  02/2014  . Cataract extraction w/ intraocular lens  implant, bilateral Bilateral    Social History:  reports that she has never smoked. She has never used smokeless tobacco. She reports that she drinks alcohol. She reports that she does not use illicit drugs.  Allergies  Allergen Reactions  . Pirfenidone Other (See Comments)    Congestion, breathing diffulty    Family History  Problem Relation Age of Onset  . Heart failure Mother   . Depression Mother   . Ovarian cancer Paternal Grandmother   . Heart disease Paternal Grandfather   . Obesity Other     Prior to Admission medications   Medication Sig Start Date End Date Taking? Authorizing Provider  albuterol (PROVENTIL) (2.5 MG/3ML) 0.083% nebulizer solution Take 3 mLs (2.5 mg total) by nebulization every 2 (two) hours as needed for wheezing or shortness of breath. 10/20/14  Yes Richarda Overlie, MD  allopurinol (ZYLOPRIM) 300 MG tablet Take 1 tablet (300 mg total) by mouth daily. 12/02/14  Yes Daniel J Angiulli, PA-C  Alum & Mag Hydroxide-Simeth (MAGIC MOUTHWASH W/LIDOCAINE) SOLN Take 15 mLs by mouth 3 (three) times daily as needed for mouth pain. 12/02/14  Yes Daniel J Angiulli, PA-C  aspirin 81 MG tablet Take 81 mg by mouth daily.   Yes Historical Provider, MD  atorvastatin (LIPITOR) 10 MG tablet Take 1 tablet (10 mg total) by mouth daily. 12/02/14  Yes Daniel J Angiulli, PA-C  budesonide (PULMICORT) 0.25 MG/2ML nebulizer solution Take 0.25 mg by nebulization 2 (two) times daily.   Yes Historical Provider, MD  cetirizine (ZYRTEC ALLERGY)  10 MG tablet Take 10 mg by mouth daily.    Yes Historical Provider, MD  diltiazem (CARDIZEM CD) 240 MG 24 hr capsule Take 1 capsule (240 mg total) by mouth daily. 12/02/14  Yes Daniel J Angiulli, PA-C  escitalopram (LEXAPRO) 20 MG tablet Take 1 tablet (20 mg total) by mouth daily. 12/02/14  Yes Daniel J Angiulli, PA-C  famotidine (PEPCID) 20 MG tablet Take 1 tablet (20 mg total) by mouth daily. 12/02/14  Yes Daniel J Angiulli, PA-C  ferrous sulfate 325 (65 FE) MG tablet Take 1 tablet (325 mg total) by mouth 2 (two) times daily with a meal. 12/02/14  Yes Daniel J Angiulli, PA-C  furosemide (LASIX) 40 MG tablet Take 1 tablet (40 mg total) by mouth 2 (two) times daily. 12/02/14  Yes Daniel J Angiulli, PA-C  gabapentin (NEURONTIN) 300 MG capsule Take 1 capsule (300 mg total) by mouth daily. 12/02/14  Yes Reuel Boom  J Angiulli, PA-C  metolazone (ZAROXOLYN) 5 MG tablet Take 1 tablet (5 mg total) by mouth daily. 12/02/14  Yes Daniel J Angiulli, PA-C  Multiple Vitamins-Minerals (MULTIVITAMIN & MINERAL PO) Take 1 tablet by mouth daily.   Yes Historical Provider, MD  potassium chloride SA (K-DUR,KLOR-CON) 20 MEQ tablet Take 2 tablets (40 mEq total) by mouth 2 (two) times daily. 12/02/14  Yes Daniel J Angiulli, PA-C  predniSONE (DELTASONE) 5 MG tablet 8 tablets daily 1 week then 3 tablets daily 1 month then 2 tablets daily 1 month then 1 tablet daily 1 month and stop 12/02/14  Yes Daniel J Angiulli, PA-C  warfarin (COUMADIN) 2.5 MG tablet Take 1 tablet (2.5 mg total) by mouth daily at 6 PM. 12/02/14  Yes Charlton Amor, PA-C   Physical Exam: Filed Vitals:   12/04/14 1338 12/04/14 1350 12/04/14 1400 12/04/14 1446  BP: 142/72 129/87    Pulse: 57 96 98   Temp: 97.6 F (36.4 C)     TempSrc: Oral     Resp: 16 22 27    Weight:    119.931 kg (264 lb 6.4 oz)  SpO2: 96%  92%     Wt Readings from Last 3 Encounters:  12/04/14 119.931 kg (264 lb 6.4 oz)  12/02/14 125.283 kg (276 lb 3.2 oz)  11/24/14 131.2 kg (289 lb  3.9 oz)    General:  Ill appearing, falls asleep during my encounter, however is oriented x 3 when aroused, morbidly obese Eyes: PERRL, normal lids, irises & conjunctiva ENT: grossly normal hearing, lips & tongue, dry oral mucosa Neck: no LAD, masses or thyromegaly, could not assess JVD given body habitus Cardiovascular: Irregular rate and rhythm, no m/r/g. 1+ b/l lower extremity edema Telemetry: Afib w RVR Respiratory: On supplemental oxygen via [place], fine crackles to bilateral lungs, along with rhonchi Abdomen: soft, ntnd Skin: There is area of discoloration from scar over right leg, previous skin graft on left leg Musculoskeletal: grossly normal tone BUE/BLE, has bilateral edema Psychiatric: grossly normal mood and affect, speech fluent and appropriate Neurologic: grossly non-focal.          Labs on Admission:  Basic Metabolic Panel:  Recent Labs Lab 11/30/14 0911 12/01/14 0428 12/02/14 0442 12/02/14 1223 12/04/14 1424  NA 136 138 137 136 138  K 2.9* 3.2* 2.9* 3.1* 3.9  CL 79* 80* 81* 83* 86*  CO2 39* 42* 42* 38* 37*  GLUCOSE 244* 202* 158* 199* 153*  BUN 125* 120* 123* 119* 87*  CREATININE 1.91* 1.76* 1.95* 1.86* 1.57*  CALCIUM 9.9 9.8 9.8 10.0 10.5*   Liver Function Tests:  Recent Labs Lab 12/04/14 1424  AST 44*  ALT 69*  ALKPHOS 63  BILITOT 1.0  PROT 6.8  ALBUMIN 3.7   No results for input(s): LIPASE, AMYLASE in the last 168 hours. No results for input(s): AMMONIA in the last 168 hours. CBC:  Recent Labs Lab 12/04/14 1423  WBC 27.5*  HGB 17.2*  HCT 51.4*  MCV 100.6*  PLT 185   Cardiac Enzymes: No results for input(s): CKTOTAL, CKMB, CKMBINDEX, TROPONINI in the last 168 hours.  BNP (last 3 results)  Recent Labs  10/14/14 0420 11/16/14 1851 12/04/14 1423  BNP 301.7* 160.9* 280.1*    ProBNP (last 3 results)  Recent Labs  02/22/14 0947 02/24/14 0332 03/07/14 0648  PROBNP 2319.0* 2410.0* 437.5    CBG: No results for input(s): GLUCAP  in the last 168 hours.  Radiological Exams on Admission: Dg Chest Portable 1 View  12/04/2014   CLINICAL DATA:  Weakness, recent hospitalization  EXAM: PORTABLE CHEST - 1 VIEW  COMPARISON:  11/24/2014  FINDINGS: Moderate enlargement of the cardiomediastinal silhouette is noted. Lung volumes are low with marked crowding of the bronchovascular markings but no focal opacity allowing for portable technique. Trace if any pleural fluid. Cardiac leads obscure detail.  IMPRESSION: Low volume exam without focal acute finding. If symptoms persist, consider PA and lateral chest radiographs obtained at full inspiration when the patient is clinically able.   Electronically Signed   By: Christiana Pellant M.D.   On: 12/04/2014 14:27    EKG: Independently reviewed.   Assessment/Plan Principal Problem:   Sepsis Active Problems:   UTI (lower urinary tract infection)   CKD (chronic kidney disease) stage 3, GFR 30-59 ml/min   Interstitial lung disease   Debilitated   Atrial fibrillation with RVR   FTT (failure to thrive) in adult   1. Sepsis.  Present on admission, evidence by a heart rate in the 150s, respiratory rate of 27, white count of 27,500 and lactic acid of 4.15.  It is possible source of infection may be, from urinary tract infection.  Other possibilities include health care associated pneumonia although initial chest x-ray did not show obvious infiltrate.  Will place patient on sepsis protocol and admitted to the stepdown unit.  Follow-up on blood cultures along with urine cultures. Will follow lactic acid levels and repeat a 2 view chest x-ray in a.m.  Pharmacy consult for Fortaz and Vancomycin dosing.  2. Atrial fibrillation with rapid ventricular response.  I suspect A. fib with RVR precipitated by underlying infectious process patient presents septic.  Plan to continue the Cardizem drip she was started on in the emergency department.  She recently had a transthoracic echocardiogram done on 11/17/2014  showing an EF of 55-60% with dilated right ventricle having PA pressure of 55 mmHg 3. End-stage interstitial lung disease.  Patient with history of chronic hypoxemic respiratory failure recently admitted to the pulmonary critical care service for acute on chronic respiratory failure likely precipitated by underlying viral infection.  Recent echo on 11/17/2014 showing evidence of right-sided heart failure. Will treat with SoluMedrol 40 mg IV BID 4. Failure to thrive.  While he precipitated by underlying infectious process, though she presents with functional decline, generalized weakness, fatigue.  Consult physical therapy once stable.  She recently underwent acute rehabilitation.  5. Chronic anticoagulation.  Patient with history of A. fib and DVT on Coumadin therapy.  INR on admission at 2.13, will consult pharmacy for warfarin management. 6. History of chronic diastolic congestive heart failure.  Last echo performed on 11/17/2014 showing preserved ejection fraction.  She presents with sepsis, will hold diuretics therapy, reassess volume status in a.m. not appear to have acute decompensated heart failure. 7. Stage III chronic kidney disease.  Kidney function is stable presented with creatinine of 1.57 8. Suspected urinary tract infection.  Patient reported to surgery have been the presence of bacteria and pyuria.  She is being treated with broad-spectrum microbial therapy for sepsis.    Code Status: I discussed code status with her and her son, she clearly expressed wishes to be a Full Code Family Communication: I spoke with her sone who was present at bedside Disposition Plan: Anticipate patient will require a greater than two-month hospitalization  Time spent: 70 minutes  Jeralyn Bennett [facility] Pager [phone]

## 2014-12-04 NOTE — Progress Notes (Addendum)
ANTIBIOTIC CONSULT NOTE - INITIAL  Pharmacy Consult for Vancomycin, Zosyn  Indication: Sepsis   Allergies  Allergen Reactions  . Pirfenidone Other (See Comments)    Congestion, breathing diffulty    Patient Measurements: Weight: 264 lb 6.4 oz (119.931 kg)  Vital Signs: Temp: 97.6 F (36.4 C) (07/24 1338) Temp Source: Oral (07/24 1338) BP: 129/87 mmHg (07/24 1350) Pulse Rate: 98 (07/24 1400) Intake/Output from previous day:   Intake/Output from this shift:    Labs:  Recent Labs  12/02/14 0442 12/02/14 1223 12/04/14 1423  WBC  --   --  27.5*  HGB  --   --  17.2*  PLT  --   --  185  CREATININE 1.95* 1.86*  --    Estimated Creatinine Clearance: 30.3 mL/min (by C-G formula based on Cr of 1.86). No results for input(s): VANCOTROUGH, VANCOPEAK, VANCORANDOM, GENTTROUGH, GENTPEAK, GENTRANDOM, TOBRATROUGH, TOBRAPEAK, TOBRARND, AMIKACINPEAK, AMIKACINTROU, AMIKACIN in the last 72 hours.   Microbiology: Recent Results (from the past 720 hour(s))  MRSA PCR Screening     Status: Abnormal   Collection Time: 11/16/14  5:29 PM  Result Value Ref Range Status   MRSA by PCR POSITIVE (A) NEGATIVE Final    Comment:        The GeneXpert MRSA Assay (FDA approved for NASAL specimens only), is one component of a comprehensive MRSA colonization surveillance program. It is not intended to diagnose MRSA infection nor to guide or monitor treatment for MRSA infections. RESULT CALLED TO, READ BACK BY AND VERIFIED WITH: T TOMLINSON RN 2200 11/16/14 A BROWNING   Culture, blood (routine x 2)     Status: None   Collection Time: 11/16/14  6:51 PM  Result Value Ref Range Status   Specimen Description BLOOD LEFT HAND  Final   Special Requests IN PEDIATRIC BOTTLE 1CC  Final   Culture NO GROWTH 5 DAYS  Final   Report Status 11/21/2014 FINAL  Final  Culture, blood (routine x 2)     Status: None   Collection Time: 11/16/14  6:59 PM  Result Value Ref Range Status   Specimen Description BLOOD  RIGHT HAND  Final   Special Requests IN PEDIATRIC BOTTLE 3CC  Final   Culture NO GROWTH 5 DAYS  Final   Report Status 11/21/2014 FINAL  Final  Respiratory virus panel     Status: Abnormal   Collection Time: 11/17/14  3:12 PM  Result Value Ref Range Status   Respiratory Syncytial Virus A Negative Negative Final   Respiratory Syncytial Virus B Negative Negative Final   Influenza A Negative Negative Final   Influenza B Negative Negative Final   Parainfluenza 1 Negative Negative Final   Parainfluenza 2 Negative Negative Final   Parainfluenza 3 Positive (A) Negative Final   Metapneumovirus Negative Negative Final   Rhinovirus Negative Negative Final   Adenovirus Negative Negative Final    Comment: (NOTE) Performed At: [facility] [address] [place], [place] [id number] Mila Homer MD JX:[id number]     Medical History: Past Medical History  Diagnosis Date  . Congestive heart failure, unspecified     a. ECHO 6/0: EF 55-60%, mild LVH, mildly dilated RV w. mildly dec fx, RVSP 67  . Chronic atrial fibrillation     failed cardioversion in past  -repeat DC-CV on October 5th, 2010  . Personal history of DVT (deep vein thrombosis) 2008    LLE  . HTN (hypertension)   . Morbid obesity   . Shingles  with postherpetic neuraigia  . GERD (gastroesophageal reflux disease)   . Hyperlipidemia   . Meningitis ~ 1960    hospitalized @ [facility]  . Gallstones     s/p cholecystectomy  . CAD (coronary artery disease)     nonobstructive by cath 8/10 - LAD 40-50% w mild PAH mean 29 w PVR 3.2 [place]  . PE (pulmonary embolism) ~ 2010    LLE  . Shortness of breath dyspnea   . Lung interstitial disease   . Hypoxemia   . COPD (chronic obstructive pulmonary disease)   . On home oxygen therapy     "5L; 24/7" (10/11/2014)  . OSA treated with BiPAP     "I think it's BiPAP"  . AAA (abdominal aortic aneurysm)     small  . History of blood transfusion   . Anemia   . Iron  deficiency anemia   . History of blood transfusion     "related to nose bleed"  . Gout   . Depression   . Chronic kidney disease (CKD), stage II (mild)     Medications:   (Not in a hospital admission) Assessment: 60 YOF who was recently admitted for hypoxia and discharged to rehab presented to the ED with generalized weakness and urinary incontinence. Pharmacy consulted to start empiric Vancomycin and Zosyn for Sepsis. WBC is elevated at 27.5. Pt is afebrile. LA elevated at 4.15. CrCl ~ 30 mL/min (lower than baseline)  7/24 BCx2>> 7/24 UCx>>   Goal of Therapy:  Vancomycin trough level 15-20 mcg/ml  Plan:  -Vancomycin 2 gm IV load followed by Vanc 1250 mg IV Q 24 hours -Zosyn 3.375 gm IV Q 8 hours (EI infusion) -Monitor CBC, renal fx, cultures and clinical progress -VT at [facility]  Vinnie Level, PharmD., BCPS Clinical Pharmacist Pager [phone]  ADDN: Pharmacy is consulted to transition zosyn to ceftazidime for sepsis and warfarin for atrial fibrillation. INR 2.13, Hgb 17.2, Plt 185.   PTA Warfarin Dose: 2.5mg /d with last dose 7/24  Plan: Stop zosyn Continue vancomycin  q24h Ceftazidime 2g IV q12h Hold warfarin tonight Daily INR/CBC Monitor s/sx of bleeding  Arlean Hopping. Newman Pies, PharmD Clinical Pharmacist Pager [phone]

## 2014-12-04 NOTE — ED Notes (Signed)
Pt was just discharged from hospital this week. Since shes been home she began to have urinary incontinence and states she just does not feel well. She is A&Ox4, no respiratory distress. She c/o painful thrush in her mouth and on her buttocks which she is being treated for currently.

## 2014-12-05 ENCOUNTER — Inpatient Hospital Stay (HOSPITAL_COMMUNITY): Payer: Medicare Other

## 2014-12-05 LAB — BASIC METABOLIC PANEL
Anion gap: 13 (ref 5–15)
BUN: 74 mg/dL — ABNORMAL HIGH (ref 6–20)
CHLORIDE: 91 mmol/L — AB (ref 101–111)
CO2: 36 mmol/L — ABNORMAL HIGH (ref 22–32)
Calcium: 9.3 mg/dL (ref 8.9–10.3)
Creatinine, Ser: 1.43 mg/dL — ABNORMAL HIGH (ref 0.44–1.00)
GFR calc non Af Amer: 33 mL/min — ABNORMAL LOW (ref >60)
GFR, EST AFRICAN AMERICAN: 39 mL/min — AB (ref >60)
Glucose, Bld: 182 mg/dL — ABNORMAL HIGH (ref 65–99)
Potassium: 4 mmol/L (ref 3.5–5.1)
Sodium: 140 mmol/L (ref 135–145)

## 2014-12-05 LAB — CBC
HCT: 45.7 % (ref 36.0–46.0)
Hemoglobin: 14.6 g/dL (ref 12.0–15.0)
MCH: 32.4 pg (ref 26.0–34.0)
MCHC: 31.9 g/dL (ref 30.0–36.0)
MCV: 101.3 fL — AB (ref 78.0–100.0)
Platelets: 178 10*3/uL (ref 150–400)
RBC: 4.51 MIL/uL (ref 3.87–5.11)
RDW: 15.9 % — AB (ref 11.5–15.5)
WBC: 17.4 10*3/uL — ABNORMAL HIGH (ref 4.0–10.5)

## 2014-12-05 LAB — PROTIME-INR
INR: 2.08 — AB (ref 0.00–1.49)
Prothrombin Time: 23.3 seconds — ABNORMAL HIGH (ref 11.6–15.2)

## 2014-12-05 MED ORDER — WARFARIN SODIUM 2.5 MG PO TABS
2.5000 mg | ORAL_TABLET | Freq: Every day | ORAL | Status: DC
Start: 2014-12-05 — End: 2014-12-09
  Administered 2014-12-05 – 2014-12-08 (×4): 2.5 mg via ORAL
  Filled 2014-12-05 (×5): qty 1

## 2014-12-05 MED ORDER — FLUCONAZOLE 100 MG PO TABS
100.0000 mg | ORAL_TABLET | Freq: Every day | ORAL | Status: DC
  Administered 2014-12-05 – 2014-12-07 (×3): 100 mg via ORAL
  Filled 2014-12-05 (×3): qty 1

## 2014-12-05 MED ORDER — FUROSEMIDE 40 MG PO TABS
40.0000 mg | ORAL_TABLET | Freq: Two times a day (BID) | ORAL | Status: DC
  Administered 2014-12-05 (×2): 40 mg via ORAL
  Filled 2014-12-05 (×4): qty 1

## 2014-12-05 MED ORDER — VANCOMYCIN HCL 10 G IV SOLR
1250.0000 mg | INTRAVENOUS | Status: DC
  Administered 2014-12-05 – 2014-12-06 (×2): 1250 mg via INTRAVENOUS
  Filled 2014-12-05 (×3): qty 1250

## 2014-12-05 NOTE — Progress Notes (Signed)
[facility] PROGRESS NOTE  [name] [name] ZOX:[id number] DOB: 02-10-1932 DOA: 12/04/2014 PCP: Kerby Nora, MD  Assessment/Plan: 1. Sepsis -Present on admission, evidenced by a white count of 20,000 thousand 500, respiratory 27, heart rate 150s, lactic acid of 4.15. -Source of infection could be urinary tract infection as u/a showed presence of pyuria and bacteria. -Patient showing clinical improvement with a downward trend in her white count to 17,400 from 22,000 on admission -She is currently hemodynamically stable and afebrile -Plan to follow-up on urine and blood cultures, continue empiric antibiotic therapy with vancomycin and Elita Quick, continue supportive care and close monitoring in the step down unit  2.  Atrial fibrillation with rapid ventricular response. -Patient presented with A. fib with RVR having ventricular rates in the 140s to 150s. -She was started on a Cardizem drip currently having heart rates in the 110's -I suspect underlying infectious process and advanced lung disease precipitating A. fib with RVR -Continue Cardizem, hopefully can transition to orals in the next 24 hours  3.  End-stage interstitial lung disease. -Patient having acute on chronic hypoxemic respiratory failure on admission with an increase in her oxygen requirements having a respiratory rate of 27. -Suspect patient have an acute flareup of interstitial lung disease -She was started on Solu-Medrol 40 mg IV twice a day showing clinical improvement this morning -Chest x-ray did not show obvious infiltrate, or any repeat chest x-ray this morning  4.  Chronic anticoagulation. -Pharmacy was consulted for Coumadin management -INR within the therapeutic range of 2.08  5.  Chronic diastolic congestive heart failure -Transthoracic echocardiogram performed on 11/17/2014 showed preserved ejection fraction. Diabetics were held on admission due to sepsis. -She appears much better today, will restart Lasix at 40  mg by mouth twice a day  6.  Urinary tract infection -Suspect may be cause of sepsis -Awaiting urine cultures -Continue empiric IV antimicrobial therapy  Code Status: Full code Family Communication: I spoke with her son last night Disposition Plan: Continue close monitoring in the step down unit   Antibiotics:  Vancomycin  Fortaz  HPI/Subjective: Patient is an 81 year old female with a past medical history of end-stage pulmonary fibrosis, chronic diastolic congestive heart failure, chronic hypoxemic respiratory failure, atrial fibrillation who was admitted to the medicine service on 12/04/2014 when she presented with complaints of a generalized weakness and functional decline. She had a recent hospitalization for acute on chronic hypoxemic respiratory failure believed to be secondary to acute flareup of end-stage interstitial lung disease and viral infection. She was discharged to inpatient rehabilitation. She was discharged from inpatient rehabilitation on 11/24/2014 and had been doing poorly at home. She was found to be in atrial fibrillation rapid ventricular response, encephalopathic, having sepsis criteria with white count of 27,000, lactate of 4.15, respiratory rate of 27. She was started on broad-spectrum IV antimicrobial therapy with Elita Quick and vancomycin with pharmacy consulted for dosing. Patient showed significant clinical improvement by the following morning. It was suspected at source of infection could be from urinary tract infection.  Objective: Filed Vitals:   12/05/14 0405  BP: 145/65  Pulse: 100  Temp: 98.1 F (36.7 C)  Resp: 23    Intake/Output Summary (Last 24 hours) at 12/05/14 0753 Last data filed at 12/05/14 0500  Gross per 24 hour  Intake 727.58 ml  Output    400 ml  Net 327.58 ml   Filed Weights   12/04/14 1446 12/04/14 1645  Weight: 119.931 kg (264 lb 6.4 oz) 134.2 kg (295 lb 13.7  oz)    Exam:   General:  Overall appears better today, she is  awake and alert, oriented, in no acute distress  Cardiovascular: Irregular rate and rhythm, tachycardic, having bilateral 1+ lower extremity pitting edema  Respiratory: Continues to have fine crackles bilaterally, few rhonchi, on supplemental oxygen, does not appear to be in respiratory distress  Abdomen: Obese, soft nontender nondistended  Musculoskeletal: 1+ bilateral extremity pitting edema  Data Reviewed: Basic Metabolic Panel:  Recent Labs Lab 12/02/14 0442 12/02/14 1223 12/04/14 1424 12/04/14 1839 12/05/14 0245  NA 137 136 138 133* 140  K 2.9* 3.1* 3.9 4.0 4.0  CL 81* 83* 86* 85* 91*  CO2 42* 38* 37* 36* 36*  GLUCOSE 158* 199* 153* 210* 182*  BUN 123* 119* 87* 79* 74*  CREATININE 1.95* 1.86* 1.57* 1.42* 1.43*  CALCIUM 9.8 10.0 10.5* 9.2 9.3   Liver Function Tests:  Recent Labs Lab 12/04/14 1424 12/04/14 1839  AST 44* 43*  ALT 69* 61*  ALKPHOS 63 58  BILITOT 1.0 1.4*  PROT 6.8 6.1*  ALBUMIN 3.7 3.3*   No results for input(s): LIPASE, AMYLASE in the last 168 hours. No results for input(s): AMMONIA in the last 168 hours. CBC:  Recent Labs Lab 12/04/14 1423 12/04/14 1839 12/05/14 0245  WBC 27.5* 22.1* 17.4*  NEUTROABS  --  21.0*  --   HGB 17.2* 15.7* 14.6  HCT 51.4* 48.2* 45.7  MCV 100.6* 101.3* 101.3*  PLT 185 180 178   Cardiac Enzymes: No results for input(s): CKTOTAL, CKMB, CKMBINDEX, TROPONINI in the last 168 hours. BNP (last 3 results)  Recent Labs  10/14/14 0420 11/16/14 1851 12/04/14 1423  BNP 301.7* 160.9* 280.1*    ProBNP (last 3 results)  Recent Labs  02/22/14 0947 02/24/14 0332 03/07/14 0648  PROBNP 2319.0* 2410.0* 437.5    CBG: No results for input(s): GLUCAP in the last 168 hours.  Recent Results (from the past 240 hour(s))  MRSA PCR Screening     Status: Abnormal   Collection Time: 12/04/14  4:45 PM  Result Value Ref Range Status   MRSA by PCR POSITIVE (A) NEGATIVE Final    Comment:        The GeneXpert MRSA  Assay (FDA approved for NASAL specimens only), is one component of a comprehensive MRSA colonization surveillance program. It is not intended to diagnose MRSA infection nor to guide or monitor treatment for MRSA infections. CRITICAL RESULT CALLED TO, READ BACK BY AND VERIFIED WITH: M.NORMENT,RN 12/04/14  BY V.WILKINS      Studies: Dg Chest 2 View  12/05/2014   CLINICAL DATA:  Sepsis, CHF, interstitial lung disease, chronic renal insufficiency, failure to thrive.  EXAM: CHEST  2 VIEW  COMPARISON:  Portable chest x-ray of December 04, 2014  FINDINGS: The lungs are borderline hypoinflated especially on the right. This is stable. The interstitial markings remain coarse. There is pleural fluid versus pleural thickening in the apices and along the right lateral thoracic wall which is also stable. The lateral aspect of the left hemidiaphragm remains obscured. The cardiac silhouette is enlarged. The pulmonary vascularity is engorged. The bony thorax exhibits no acute abnormality. There is moderate degenerative disc disease of the thoracic spine.  IMPRESSION: CHF with mild pulmonary interstitial edema. Biapical pleural fluid or pleural thickening. Left lower lobe atelectasis or pneumonia laterally, stable.   Electronically Signed   By: David  [place] M.D.   On: 12/05/2014 07:50   Dg Chest Portable 1 View  12/04/2014   CLINICAL  DATA:  Weakness, recent hospitalization  EXAM: PORTABLE CHEST - 1 VIEW  COMPARISON:  11/24/2014  FINDINGS: Moderate enlargement of the cardiomediastinal silhouette is noted. Lung volumes are low with marked crowding of the bronchovascular markings but no focal opacity allowing for portable technique. Trace if any pleural fluid. Cardiac leads obscure detail.  IMPRESSION: Low volume exam without focal acute finding. If symptoms persist, consider PA and lateral chest radiographs obtained at full inspiration when the patient is clinically able.   Electronically Signed   By: Christiana Pellant M.D.   On: 12/04/2014 14:27    Scheduled Meds: . allopurinol  300 mg Oral Daily  . aspirin EC  81 mg Oral Daily  . atorvastatin  10 mg Oral Daily  . budesonide  0.25 mg Nebulization BID  . cefTAZidime (FORTAZ)  IV  2 g Intravenous Q12H  . escitalopram  20 mg Oral Daily  . furosemide  40 mg Oral BID  . gabapentin  300 mg Oral Daily  . methylPREDNISolone (SOLU-MEDROL) injection  40 mg Intravenous Q12H  . sodium chloride  3 mL Intravenous Q12H  . sodium chloride  3 mL Intravenous Q12H  . vancomycin  1,250 mg Intravenous Q24H  . Warfarin - Pharmacist Dosing Inpatient   Does not apply q1800   Continuous Infusions: . diltiazem (CARDIZEM) infusion 15 mg/hr (12/05/14 0337)    Principal Problem:   Sepsis Active Problems:   UTI (lower urinary tract infection)   CKD (chronic kidney disease) stage 3, GFR 30-59 ml/min   Interstitial lung disease   Debilitated   Atrial fibrillation with RVR   FTT (failure to thrive) in adult    Time spent: 35 min    Jeralyn Bennett  [facility] Pager [phone]. If 7PM-7AM, please contact night-coverage at [email or web address], password [facility] 12/05/2014, 7:53 AM  LOS: 1 day

## 2014-12-05 NOTE — Progress Notes (Signed)
Per Infectious Disease pt needs to be on MRSA contact but not be treated for MRSA due to recent treatment.

## 2014-12-05 NOTE — Progress Notes (Signed)
ANTICOAGULATION CONSULT NOTE - Follow Up Consult  Pharmacy Consult for Warfarin Indication: atrial fibrillation  Allergies  Allergen Reactions  . Pirfenidone Other (See Comments)    Congestion, breathing diffulty    Patient Measurements: Height:  (162.6 cm) Weight: 295 lb 13.7 oz (134.2 kg) IBW/kg (Calculated) : 54.7    Vital Signs: Temp: 97.6 F (36.4 C) (07/25 1600) Temp Source: Oral (07/25 1600) BP: 139/86 mmHg (07/25 1600) Pulse Rate: 77 (07/25 1600)  Labs:  Recent Labs  12/04/14 1423 12/04/14 1424 12/04/14 1839 12/05/14 0245  HGB 17.2*  --  15.7* 14.6  HCT 51.4*  --  48.2* 45.7  PLT 185  --  180 178  APTT  --   --  28  --   LABPROT 23.6*  --  23.7* 23.3*  INR 2.13*  --  2.13* 2.08*  CREATININE  --  1.57* 1.42* 1.43*    Estimated Creatinine Clearance: 42.1 mL/min (by C-G formula based on Cr of 1.43).     Assessment: 83yof admitted with generalized weakness, malaise after recent hospitalization.  Treat for HCAP with Vancomycin, fortaz, fluconazole.   Also Hx Afib on home warfarin now RVR started on diltiazem drip.  INR 2.08, CBC stable, no bleeding.  Goal of Therapy:  INR 2-3 Monitor platelets by anticoagulation protocol: Yes   Plan:  Warfarin 2.5mg  daily Protime daily  Leota Sauers Pharm.D. CPP, BCPS Clinical Pharmacist [phone] 12/05/2014 5:18 PM

## 2014-12-05 NOTE — Progress Notes (Signed)
Patient placed on her BIPAP 14/7. Patient is tolerating well at this time. RT will continue to monitor as needed

## 2014-12-05 NOTE — Care Management Note (Signed)
Case Management Note  Patient Details  Name: [name] [name] MRN: [id number] Date of Birth: 19-May-1931  Subjective/Objective:      Adm w sepsis              Action/Plan: lives w son, chart states has aid, uses home o2   Expected Discharge Date:                  Expected Discharge Plan:     In-House Referral:     Discharge planning Services     Post Acute Care Choice:    Choice offered to:     DME Arranged:    DME Agency:     HH Arranged:  Nurse's Aide HH Agency:     Status of Service:  Completed, signed off  Medicare Important Message Given:    Date Medicare IM Given:    Medicare IM give by:    Date Additional Medicare IM Given:    Additional Medicare Important Message give by:     If discussed at Long Length of Stay Meetings, dates discussed:    Additional Comments: ur review done  Hanley Hays, RN 12/05/2014, 8:52 AM

## 2014-12-06 LAB — BASIC METABOLIC PANEL WITH GFR
Anion gap: 15 (ref 5–15)
BUN: 75 mg/dL — ABNORMAL HIGH (ref 6–20)
CO2: 37 mmol/L — ABNORMAL HIGH (ref 22–32)
Calcium: 9 mg/dL (ref 8.9–10.3)
Chloride: 82 mmol/L — ABNORMAL LOW (ref 101–111)
Creatinine, Ser: 1.62 mg/dL — ABNORMAL HIGH (ref 0.44–1.00)
GFR calc Af Amer: 33 mL/min — ABNORMAL LOW
GFR calc non Af Amer: 29 mL/min — ABNORMAL LOW
Glucose, Bld: 253 mg/dL — ABNORMAL HIGH (ref 65–99)
Potassium: 2.7 mmol/L — CL (ref 3.5–5.1)
Sodium: 134 mmol/L — ABNORMAL LOW (ref 135–145)

## 2014-12-06 LAB — CBC
HCT: 44.2 % (ref 36.0–46.0)
Hemoglobin: 14.5 g/dL (ref 12.0–15.0)
MCH: 32.7 pg (ref 26.0–34.0)
MCHC: 32.8 g/dL (ref 30.0–36.0)
MCV: 99.8 fL (ref 78.0–100.0)
Platelets: 162 K/uL (ref 150–400)
RBC: 4.43 MIL/uL (ref 3.87–5.11)
RDW: 15.4 % (ref 11.5–15.5)
WBC: 20.2 K/uL — ABNORMAL HIGH (ref 4.0–10.5)

## 2014-12-06 LAB — PROTIME-INR
INR: 1.92 — AB (ref 0.00–1.49)
Prothrombin Time: 21.8 seconds — ABNORMAL HIGH (ref 11.6–15.2)

## 2014-12-06 MED ORDER — POTASSIUM CHLORIDE CRYS ER 10 MEQ PO TBCR: 30.0000 meq | EXTENDED_RELEASE_TABLET | ORAL | Status: DC

## 2014-12-06 MED ORDER — FUROSEMIDE 40 MG PO TABS
40.0000 mg | ORAL_TABLET | Freq: Two times a day (BID) | ORAL | Status: DC
  Administered 2014-12-06 – 2014-12-10 (×9): 40 mg via ORAL
  Filled 2014-12-06 (×10): qty 1

## 2014-12-06 MED ORDER — DILTIAZEM HCL ER COATED BEADS 240 MG PO CP24
240.0000 mg | ORAL_CAPSULE | Freq: Every day | ORAL | Status: DC
  Administered 2014-12-06 – 2014-12-07 (×2): 240 mg via ORAL
  Filled 2014-12-06 (×2): qty 1

## 2014-12-06 MED ORDER — FUROSEMIDE 40 MG PO TABS: 40.0000 mg | ORAL_TABLET | Freq: Every day | ORAL | Status: DC

## 2014-12-06 MED ORDER — PREDNISONE 20 MG PO TABS
40.0000 mg | ORAL_TABLET | Freq: Every day | ORAL | Status: DC
  Administered 2014-12-06 – 2014-12-09 (×4): 40 mg via ORAL
  Filled 2014-12-06 (×5): qty 2

## 2014-12-06 MED ORDER — POTASSIUM CHLORIDE CRYS ER 20 MEQ PO TBCR
40.0000 meq | EXTENDED_RELEASE_TABLET | ORAL | Status: AC
  Administered 2014-12-06 (×2): 40 meq via ORAL
  Filled 2014-12-06 (×2): qty 2

## 2014-12-06 NOTE — Care Management Note (Signed)
Case Management Note  Patient Details  Name: [name] [name] MRN: [id number] Date of Birth: 02-09-1932  Subjective/Objective:    Adm w sepsis                Action/Plan: lives w fam, act w [facility] for hhrn and hhpt. Alerted [facility] of adm.   Expected Discharge Date:                  Expected Discharge Plan:  Home w Home Health Services  In-House Referral:     Discharge planning Services     Post Acute Care Choice:  Resumption of Svcs/PTA Provider Choice offered to:     DME Arranged:    DME Agency:     HH Arranged:  Nurse's Aide, RN, PT HH Agency:  [facility]  Status of Service:  Completed, signed off  Medicare Important Message Given:    Date Medicare IM Given:    Medicare IM give by:    Date Additional Medicare IM Given:    Additional Medicare Important Message give by:     If discussed at Long Length of Stay Meetings, dates discussed:    Additional Comments: has home o2 per chart.  Hanley Hays, RN 12/06/2014, 10:15 AM

## 2014-12-06 NOTE — Care Management Important Message (Signed)
Important Message  Patient Details  Name: [name] [name] MRN: [id number] Date of Birth: [birth year]   Medicare Important Message Given:  Yes-second notification given    Kyla Balzarine 12/06/2014, 1:09 PMImportant Message  Patient Details  Name: [name] [name] MRN: [id number] Date of Birth: 1932-03-14   Medicare Important Message Given:  Yes-second notification given    Kyla Balzarine 12/06/2014, 1:09 PM

## 2014-12-06 NOTE — Progress Notes (Addendum)
[facility] PROGRESS NOTE  [name] [name]:[id number] DOB: 1931/12/21 DOA: 12/04/2014 PCP: Kerby Nora, MD  Interim summary Patient is an 81 year old female with a past medical history of end-stage pulmonary fibrosis, chronic diastolic congestive heart failure, chronic hypoxemic respiratory failure, atrial fibrillation who was admitted to the medicine service on 12/04/2014 when she presented with complaints of a generalized weakness and functional decline. She had a recent hospitalization for acute on chronic hypoxemic respiratory failure believed to be secondary to acute flareup of end-stage interstitial lung disease and viral infection. She was discharged to inpatient rehabilitation. She was discharged from inpatient rehabilitation on 11/24/2014 and had been doing poorly at home. She was found to be in atrial fibrillation rapid ventricular response, encephalopathic, having sepsis criteria with white count of 27,000, lactate of 4.15, respiratory rate of 27. She was started on broad-spectrum IV antimicrobial therapy with Elita Quick and vancomycin with pharmacy consulted for dosing. Patient showed significant clinical improvement by the following morning. It was suspected at source of infection could be from urinary tract infection. By 12/06/2014 her A. fib with RVR improving as IV Cardizem was discontinued and she was started back on her oral regimen with Cardizem CD 240 mg by mouth daily. Suspect underlying infectious process/sepsis precipitated A. fib with RVR. Blood cultures remain negative to date. Transfer orders placed on 12/06/2014 to telemetry.  Assessment/Plan: 1. Sepsis -Present on admission, evidenced by a white count of 20,000 thousand 500, respiratory 27, heart rate 150s, lactic acid of 4.15. -Source of infection could be urinary tract infection as u/a showed presence of pyuria and bacteria. -Patient showing clinical improvement with a downward trend in her white count to 17,400 from  22,000 on admission -She is currently hemodynamically stable and afebrile -Plan to follow-up on urine and blood cultures, continue empiric antibiotic therapy with vancomycin and Fortaz -Overall she shows clinical improvement, I think upward trend and white count could be reactive from IV steroids. She has otherwise remained afebrile  2.  Atrial fibrillation with rapid ventricular response. -Patient presented with A. fib with RVR having ventricular rates in the 140s to 150s. -She was started on a Cardizem drip currently having heart rates in the 90-110's -I suspect underlying infectious process and advanced lung disease precipitating A. fib with RVR -Heart rates better controlled now, will discontinue IV Cardizem, transitioning to her home regimen with Cardizem CD 240 mg by mouth daily  3.  End-stage interstitial lung disease. -Patient having acute on chronic hypoxemic respiratory failure on admission with an increase in her oxygen requirements having a respiratory rate of 27. -Suspect patient have an acute flareup of interstitial lung disease -She was started on Solu-Medrol 40 mg IV twice a day showing clinical improvement  -Chest x-ray repeated on 12/05/2014 remained stable. -Will discontinue IV steroids, transition to oral prednisone at 40 mg by mouth daily today  4.  Chronic anticoagulation. -Pharmacy was consulted for Coumadin management -INR near therapeutic range of 1.92 on 12/06/2014, pharmacy consulted for warfarin management  5.  Chronic diastolic congestive heart failure -Transthoracic echocardiogram performed on 11/17/2014 showed preserved ejection fraction. Diabetics were held on admission due to sepsis. -Because she present with sepsis directly therapy was held on admission, restarted yesterday at 40 mg by mouth twice a day. -Creatinine trended up from 1.43 on 12/05/2014 to 1.62 on 12/06/2014.  -Will continue Lasix at 40 mg by mouth twice a day, and continue monitoring her  volume status and daily BMP's  6.  Urinary tract infection -Suspect may  be cause of sepsis -Awaiting urine cultures -Continue empiric IV antimicrobial therapy  7.  Hypokalemia. -A.m. lab work showing a downward trend potassium to 2.7. -Will replace with Kdur 40 mEq 3 doses  Code Status: Full code Family Communication: I spoke with her son last night Disposition Plan: Plan to transfer to telemetry today   Antibiotics:  Vancomycin  Fortaz  HPI/Subjective: Patient overall thinks she is feeling better, slept well last night. Tolerating by mouth intake  Objective: Filed Vitals:   12/06/14 0600  BP: 130/67  Pulse: 91  Temp:   Resp: 24    Intake/Output Summary (Last 24 hours) at 12/06/14 0826 Last data filed at 12/06/14 0700  Gross per 24 hour  Intake   1535 ml  Output   1900 ml  Net   -365 ml   Filed Weights   12/04/14 1446 12/04/14 1645 12/06/14 0300  Weight: 119.931 kg (264 lb 6.4 oz) 134.2 kg (295 lb 13.7 oz) 135.762 kg (299 lb 4.8 oz)    Exam:   General:  Overall appears better today, she is awake and alert, oriented, in no acute distress, chronically ill-appearing though  Cardiovascular: Irregular rate and rhythm, tachycardic, having bilateral 1+ lower extremity pitting edema  Respiratory: Continues to have fine crackles bilaterally, few rhonchi, on supplemental oxygen, does not appear to be in respiratory distress, much change from yesterday  Abdomen: Obese, soft nontender nondistended  Musculoskeletal: 1+ bilateral extremity pitting edema  Data Reviewed: Basic Metabolic Panel:  Recent Labs Lab 12/02/14 1223 12/04/14 1424 12/04/14 1839 12/05/14 0245 12/06/14 0233  NA 136 138 133* 140 134*  K 3.1* 3.9 4.0 4.0 2.7*  CL 83* 86* 85* 91* 82*  CO2 38* 37* 36* 36* 37*  GLUCOSE 199* 153* 210* 182* 253*  BUN 119* 87* 79* 74* 75*  CREATININE 1.86* 1.57* 1.42* 1.43* 1.62*  CALCIUM 10.0 10.5* 9.2 9.3 9.0   Liver Function Tests:  Recent Labs Lab  12/04/14 1424 12/04/14 1839  AST 44* 43*  ALT 69* 61*  ALKPHOS 63 58  BILITOT 1.0 1.4*  PROT 6.8 6.1*  ALBUMIN 3.7 3.3*   No results for input(s): LIPASE, AMYLASE in the last 168 hours. No results for input(s): AMMONIA in the last 168 hours. CBC:  Recent Labs Lab 12/04/14 1423 12/04/14 1839 12/05/14 0245 12/06/14 0233  WBC 27.5* 22.1* 17.4* 20.2*  NEUTROABS  --  21.0*  --   --   HGB 17.2* 15.7* 14.6 14.5  HCT 51.4* 48.2* 45.7 44.2  MCV 100.6* 101.3* 101.3* 99.8  PLT 185 180 178 162   Cardiac Enzymes: No results for input(s): CKTOTAL, CKMB, CKMBINDEX, TROPONINI in the last 168 hours. BNP (last 3 results)  Recent Labs  10/14/14 0420 11/16/14 1851 12/04/14 1423  BNP 301.7* 160.9* 280.1*    ProBNP (last 3 results)  Recent Labs  02/22/14 0947 02/24/14 0332 03/07/14 0648  PROBNP 2319.0* 2410.0* 437.5    CBG: No results for input(s): GLUCAP in the last 168 hours.  Recent Results (from the past 240 hour(s))  Blood culture (routine x 2)     Status: None (Preliminary result)   Collection Time: 12/04/14  2:09 PM  Result Value Ref Range Status   Specimen Description BLOOD LEFT HAND  Final   Special Requests BOTTLES DRAWN AEROBIC AND ANAEROBIC 5CC  Final   Culture NO GROWTH < 24 HOURS  Final   Report Status PENDING  Incomplete  Urine C&S     Status: None (Preliminary result)  Collection Time: 12/04/14  2:17 PM  Result Value Ref Range Status   Specimen Description URINE, CLEAN CATCH  Final   Special Requests NONE  Final   Culture CULTURE REINCUBATED FOR BETTER GROWTH  Final   Report Status PENDING  Incomplete  Blood culture (routine x 2)     Status: None (Preliminary result)   Collection Time: 12/04/14  3:02 PM  Result Value Ref Range Status   Specimen Description BLOOD RIGHT ANTECUBITAL  Final   Special Requests BOTTLES DRAWN AEROBIC AND ANAEROBIC 5CC  Final   Culture NO GROWTH < 24 HOURS  Final   Report Status PENDING  Incomplete  MRSA PCR Screening      Status: Abnormal   Collection Time: 12/04/14  4:45 PM  Result Value Ref Range Status   MRSA by PCR POSITIVE (A) NEGATIVE Final    Comment:        The GeneXpert MRSA Assay (FDA approved for NASAL specimens only), is one component of a comprehensive MRSA colonization surveillance program. It is not intended to diagnose MRSA infection nor to guide or monitor treatment for MRSA infections. CRITICAL RESULT CALLED TO, READ BACK BY AND VERIFIED WITH: M.NORMENT,RN 12/04/14 @2010  BY V.WILKINS      Studies: Dg Chest 2 View  12/05/2014   CLINICAL DATA:  Sepsis, CHF, interstitial lung disease, chronic renal insufficiency, failure to thrive.  EXAM: CHEST  2 VIEW  COMPARISON:  Portable chest x-ray of December 04, 2014  FINDINGS: The lungs are borderline hypoinflated especially on the right. This is stable. The interstitial markings remain coarse. There is pleural fluid versus pleural thickening in the apices and along the right lateral thoracic wall which is also stable. The lateral aspect of the left hemidiaphragm remains obscured. The cardiac silhouette is enlarged. The pulmonary vascularity is engorged. The bony thorax exhibits no acute abnormality. There is moderate degenerative disc disease of the thoracic spine.  IMPRESSION: CHF with mild pulmonary interstitial edema. Biapical pleural fluid or pleural thickening. Left lower lobe atelectasis or pneumonia laterally, stable.   Electronically Signed   By: David  [place] M.D.   On: 12/05/2014 07:50   Dg Chest Portable 1 View  12/04/2014   CLINICAL DATA:  Weakness, recent hospitalization  EXAM: PORTABLE CHEST - 1 VIEW  COMPARISON:  11/24/2014  FINDINGS: Moderate enlargement of the cardiomediastinal silhouette is noted. Lung volumes are low with marked crowding of the bronchovascular markings but no focal opacity allowing for portable technique. Trace if any pleural fluid. Cardiac leads obscure detail.  IMPRESSION: Low volume exam without focal acute finding. If  symptoms persist, consider PA and lateral chest radiographs obtained at full inspiration when the patient is clinically able.   Electronically Signed   By: Christiana Pellant M.D.   On: 12/04/2014 14:27    Scheduled Meds: . allopurinol  300 mg Oral Daily  . aspirin EC  81 mg Oral Daily  . atorvastatin  10 mg Oral Daily  . budesonide  0.25 mg Nebulization BID  . cefTAZidime (FORTAZ)  IV  2 g Intravenous Q12H  . diltiazem  240 mg Oral Daily  . escitalopram  20 mg Oral Daily  . fluconazole  100 mg Oral Daily  . furosemide  40 mg Oral BID  . gabapentin  300 mg Oral Daily  . potassium chloride  40 mEq Oral Q4H  . predniSONE  40 mg Oral Q breakfast  . sodium chloride  3 mL Intravenous Q12H  . sodium chloride  3 mL Intravenous  Q12H  . vancomycin  1,250 mg Intravenous Q24H  . warfarin  2.5 mg Oral q1800  . Warfarin - Pharmacist Dosing Inpatient   Does not apply q1800   Continuous Infusions:    Principal Problem:   Sepsis Active Problems:   UTI (lower urinary tract infection)   CKD (chronic kidney disease) stage 3, GFR 30-59 ml/min   Interstitial lung disease   Debilitated   Atrial fibrillation with RVR   FTT (failure to thrive) in adult    Time spent: 35 min    Jeralyn Bennett  [facility] Pager [phone]. If 7PM-7AM, please contact night-coverage at [email or web address], password [facility] 12/06/2014, 8:26 AM  LOS: 2 days

## 2014-12-06 NOTE — Progress Notes (Signed)
PT Cancellation Note  Patient Details Name: NADEA KIRKLAND MRN: [id number] DOB: [birth year]   Cancelled Treatment:    Reason Eval/Treat Not Completed: Medical issues which prohibited therapy (K 2.7 and not yet repleted)   Toney Sang Beth 12/06/2014, 7:08 AM Delaney Meigs, PT [phone]

## 2014-12-06 NOTE — Progress Notes (Signed)
[facility]  Patient Status: Active (receiving services up to time of hospitalization)  [name] is providing the following services: RN and PT  Referred for services but readmitted to Hospital prior to start of care.  If patient discharges after hours, please call [phone].   [name] [name] 12/06/2014, 11:46 AM

## 2014-12-06 NOTE — Progress Notes (Signed)
ANTICOAGULATION CONSULT NOTE - Follow Up Consult  Pharmacy Consult for warfarin Indication: atrial fibrillation  Allergies  Allergen Reactions  . Pirfenidone Other (See Comments)    Congestion, breathing diffulty    Patient Measurements: Height: 5\' 4"  (162.6 cm) Weight: 299 lb 4.8 oz (135.762 kg) IBW/kg (Calculated) : 54.7  Vital Signs: Temp: 97.4 F (36.3 C) (07/26 0821) Temp Source: Oral (07/26 0821) BP: 120/51 mmHg (07/26 0821) Pulse Rate: 65 (07/26 0900)  Labs:  Recent Labs  12/04/14 1839 12/05/14 0245 12/06/14 0233  HGB 15.7* 14.6 14.5  HCT 48.2* 45.7 44.2  PLT 180 178 162  APTT 28  --   --   LABPROT 23.7* 23.3* 21.8*  INR 2.13* 2.08* 1.92*  CREATININE 1.42* 1.43* 1.62*    Estimated Creatinine Clearance: 37.4 mL/min (by C-G formula based on Cr of 1.62).   Medications:  Prescriptions prior to admission  Medication Sig Dispense Refill Last Dose  . albuterol (PROVENTIL) (2.5 MG/3ML) 0.083% nebulizer solution Take 3 mLs (2.5 mg total) by nebulization every 2 (two) hours as needed for wheezing or shortness of breath. 75 mL 12 12/04/2014 at Unknown time  . allopurinol (ZYLOPRIM) 300 MG tablet Take 1 tablet (300 mg total) by mouth daily. 30 tablet 11 12/04/2014 at Unknown time  . Alum & Mag Hydroxide-Simeth (MAGIC MOUTHWASH W/LIDOCAINE) SOLN Take 15 mLs by mouth 3 (three) times daily as needed for mouth pain. 15 mL 0 unk  . aspirin 81 MG tablet Take 81 mg by mouth daily.   12/04/2014 at Unknown time  . atorvastatin (LIPITOR) 10 MG tablet Take 1 tablet (10 mg total) by mouth daily. 30 tablet 5 12/04/2014 at Unknown time  . budesonide (PULMICORT) 0.25 MG/2ML nebulizer solution Take 0.25 mg by nebulization 2 (two) times daily.   12/04/2014 at Unknown time  . cetirizine (ZYRTEC ALLERGY) 10 MG tablet Take 10 mg by mouth daily.    12/04/2014 at Unknown time  . diltiazem (CARDIZEM CD) 240 MG 24 hr capsule Take 1 capsule (240 mg total) by mouth daily. 30 capsule 0 12/04/2014 at  Unknown time  . escitalopram (LEXAPRO) 20 MG tablet Take 1 tablet (20 mg total) by mouth daily. 30 tablet 5 12/04/2014 at Unknown time  . famotidine (PEPCID) 20 MG tablet Take 1 tablet (20 mg total) by mouth daily. 30 tablet 6 12/04/2014 at Unknown time  . ferrous sulfate 325 (65 FE) MG tablet Take 1 tablet (325 mg total) by mouth 2 (two) times daily with a meal. 60 tablet 0 12/04/2014 at Unknown time  . furosemide (LASIX) 40 MG tablet Take 1 tablet (40 mg total) by mouth 2 (two) times daily. 30 tablet 0 12/04/2014 at Unknown time  . gabapentin (NEURONTIN) 300 MG capsule Take 1 capsule (300 mg total) by mouth daily. 90 capsule 0 12/04/2014 at Unknown time  . metolazone (ZAROXOLYN) 5 MG tablet Take 1 tablet (5 mg total) by mouth daily. 30 tablet 1 12/04/2014 at Unknown time  . Multiple Vitamins-Minerals (MULTIVITAMIN & MINERAL PO) Take 1 tablet by mouth daily.   12/04/2014 at Unknown time  . potassium chloride SA (K-DUR,KLOR-CON) 20 MEQ tablet Take 2 tablets (40 mEq total) by mouth 2 (two) times daily.   12/04/2014 at Unknown time  . predniSONE (DELTASONE) 5 MG tablet 8 tablets daily 1 week then 3 tablets daily 1 month then 2 tablets daily 1 month then 1 tablet daily 1 month and stop 180 tablet 1 12/04/2014 at Unknown time  . warfarin (COUMADIN) 2.5 MG  tablet Take 1 tablet (2.5 mg total) by mouth daily at 6 PM. 30 tablet 0 12/04/2014 at Unknown time    Assessment: 81 yo woman admitted 12/04/2014 for AFib with RVR, encephalopathy, & meeting sepsis criteria. Started on broad spectrum abx.  PMH end-stage pulmonary fibrosis, chronic diastolic congestive heart failure, chronic hypoxemic respiratory failure, atrial fibrillation   Pharmacy consulted to dose warfarin.  AC/Heme: Afib, hx LLE DVT 2008 & 2010 INR 1.92 subtherapeutic, INR therapeutic at 2.13 on admission CBC stable On fluconazole, which may increase INR   PTA warfarin dose 2.5mg  qday   Goal of Therapy:  INR 2-3 Monitor platelets by  anticoagulation protocol: Yes   Plan:  Continue warfarin 2.5 mg qday INR & CBC daily Monitor for s/sx bleeding   Floria Raveling 12/06/2014,10:26 AM

## 2014-12-07 ENCOUNTER — Encounter: Payer: Medicare Other | Admitting: Cardiovascular Disease

## 2014-12-07 ENCOUNTER — Telehealth: Payer: Self-pay | Admitting: Pulmonary Disease

## 2014-12-07 LAB — POTASSIUM: Potassium: 3.5 mmol/L (ref 3.5–5.1)

## 2014-12-07 LAB — PROTIME-INR
INR: 1.99 — ABNORMAL HIGH (ref 0.00–1.49)
Prothrombin Time: 22.5 seconds — ABNORMAL HIGH (ref 11.6–15.2)

## 2014-12-07 LAB — CBC
HCT: 43.5 % (ref 36.0–46.0)
Hemoglobin: 14.5 g/dL (ref 12.0–15.0)
MCH: 32.9 pg (ref 26.0–34.0)
MCHC: 33.3 g/dL (ref 30.0–36.0)
MCV: 98.6 fL (ref 78.0–100.0)
Platelets: 161 10*3/uL (ref 150–400)
RBC: 4.41 MIL/uL (ref 3.87–5.11)
RDW: 15.3 % (ref 11.5–15.5)
WBC: 21.3 10*3/uL — AB (ref 4.0–10.5)

## 2014-12-07 LAB — BASIC METABOLIC PANEL
ANION GAP: 11 (ref 5–15)
BUN: 69 mg/dL — ABNORMAL HIGH (ref 6–20)
CHLORIDE: 89 mmol/L — AB (ref 101–111)
CO2: 37 mmol/L — ABNORMAL HIGH (ref 22–32)
Calcium: 9.1 mg/dL (ref 8.9–10.3)
Creatinine, Ser: 1.4 mg/dL — ABNORMAL HIGH (ref 0.44–1.00)
GFR, EST AFRICAN AMERICAN: 40 mL/min — AB (ref >60)
GFR, EST NON AFRICAN AMERICAN: 34 mL/min — AB (ref >60)
GLUCOSE: 164 mg/dL — AB (ref 65–99)
Potassium: 2.7 mmol/L — CL (ref 3.5–5.1)
Sodium: 137 mmol/L (ref 135–145)

## 2014-12-07 LAB — MAGNESIUM: Magnesium: 2.1 mg/dL (ref 1.7–2.4)

## 2014-12-07 MED ORDER — POTASSIUM CHLORIDE CRYS ER 20 MEQ PO TBCR
40.0000 meq | EXTENDED_RELEASE_TABLET | Freq: Every day | ORAL | Status: DC
  Administered 2014-12-07 – 2014-12-14 (×8): 40 meq via ORAL
  Filled 2014-12-07 (×9): qty 2

## 2014-12-07 MED ORDER — CEFTRIAXONE SODIUM IN DEXTROSE 20 MG/ML IV SOLN
1.0000 g | INTRAVENOUS | Status: DC
  Administered 2014-12-07 – 2014-12-10 (×4): 1 g via INTRAVENOUS
  Filled 2014-12-07 (×5): qty 50

## 2014-12-07 MED ORDER — DILTIAZEM HCL ER COATED BEADS 300 MG PO CP24
300.0000 mg | ORAL_CAPSULE | Freq: Every day | ORAL | Status: DC
  Administered 2014-12-08: 300 mg via ORAL
  Filled 2014-12-07: qty 1

## 2014-12-07 MED ORDER — NYSTATIN 100000 UNIT/ML MT SUSP
5.0000 mL | Freq: Four times a day (QID) | OROMUCOSAL | Status: DC
  Administered 2014-12-07 – 2014-12-14 (×28): 500000 [IU] via ORAL
  Administered 2014-12-14: 5 mL via ORAL
  Administered 2014-12-15 – 2014-12-29 (×56): 500000 [IU] via ORAL
  Filled 2014-12-07 (×95): qty 5

## 2014-12-07 MED ORDER — POTASSIUM CHLORIDE CRYS ER 20 MEQ PO TBCR
30.0000 meq | EXTENDED_RELEASE_TABLET | ORAL | Status: AC
  Administered 2014-12-07 (×2): 30 meq via ORAL
  Filled 2014-12-07 (×4): qty 1

## 2014-12-07 NOTE — Progress Notes (Signed)
[facility] PROGRESS NOTE  [name] [name] ZOX:[id number] DOB: Feb 13, 1932 DOA: 12/04/2014 PCP: Kerby Nora, MD  Assessment/Plan: 1. Sepsis -Present on admission, evidenced by a white count of 20,000 thousand 500, respiratory 27, heart rate 150s, lactic acid of 4.15. -Source of infection could be urinary tract infection as u/a showed presence of pyuria and bacteria. -Patient showing clinical improvement with a downward trend in her white count to 17,400 from 22,000 on admission -She is currently hemodynamically stable and afebrile -Plan to follow-up on blood cultures, continue antibiotic therapy now adjusted base on sensitivity  2.  Atrial fibrillation with rapid ventricular response. -Patient presented with A. fib with RVR having ventricular rates in the 140s to 150s. -She was started on a Cardizem drip currently having heart rates in the 90-110's -I suspect underlying infectious process and advanced lung disease precipitating A. fib with RVR -Heart rates better controlled now, but still elevated. Will adjust cardizem to 300mg  daily  3.  End-stage interstitial lung disease. -Patient having acute on chronic hypoxemic respiratory failure on admission with an increase in her oxygen requirements having a respiratory rate of 27. -Suspect patient have an acute flareup of interstitial lung disease -Chest x-ray repeated on 12/05/2014 remained stable. -Will continue tapering steroids  4.  Chronic anticoagulation. -Pharmacy was consulted for Coumadin management -INR adjusted by pharmacy   5.  Chronic diastolic congestive heart failure -Transthoracic echocardiogram performed on 11/17/2014 showed preserved ejection fraction. Diuretics were held on admission due to sepsis and soft BP. -Creatinine trended up from 1.43 on 12/05/2014 to 1.62 on 12/06/2014.  -Will continue Lasix at 40 mg by mouth twice a day, and continue monitoring her volume status and follow daily BMP's  6.  Proteus mirabilis  Urinary tract infection -Suspect may be cause of sepsis -following sensitivity will narrow antibiotics -patient on rocephin now  7.  Hypokalemia. -A.m. lab showing potassium in 2.7 range again -has been repleted and patient now started on daily maintenance potassium  8. Ginette Pitman  -will start patient on nystatin   Code Status: Full code Family Communication: I spoke with her son last night Disposition Plan: Plan to transfer to telemetry today   Antibiotics:  Vancomycin 7/24>>7/27  Elita Quick 7/24>>7/27  Rocephin 7/27  HPI/Subjective: Patient overall thinks she is feeling better, denies CP and is no complaining of SOB.  Objective: Filed Vitals:   12/07/14 1243  BP: 140/84  Pulse: 80  Temp: 97.3 F (36.3 C)  Resp: 28    Intake/Output Summary (Last 24 hours) at 12/07/14 1603 Last data filed at 12/07/14 1402  Gross per 24 hour  Intake    820 ml  Output   1500 ml  Net   -680 ml   Filed Weights   12/04/14 1645 12/06/14 0300 12/07/14 0351  Weight: 134.2 kg (295 lb 13.7 oz) 135.762 kg (299 lb 4.8 oz) 138.846 kg (306 lb 1.6 oz)    Exam:   General:  Overall appears much better today, she is awake and alert, oriented X 3, in no acute distress, chronically ill-appearing and complaining of mouth pain.  Cardiovascular: Irregular rate and rhythm, tachycardic, having bilateral 1-2+ lower extremity pitting edema (unchanged from baseline according to patient)  Respiratory: positive fine crackles bilaterally, few rhonchi, on supplemental oxygen, does not appear to be in respiratory distress  Abdomen: Obese, soft nontender nondistended  Musculoskeletal: 1-2+ bilateral extremity pitting edema  Data Reviewed: Basic Metabolic Panel:  Recent Labs Lab 12/04/14 1424 12/04/14 1839 12/05/14 0245 12/06/14 0233 12/07/14 0355 12/07/14  1209  NA 138 133* 140 134* 137  --   K 3.9 4.0 4.0 2.7* 2.7* 3.5  CL 86* 85* 91* 82* 89*  --   CO2 37* 36* 36* 37* 37*  --   GLUCOSE 153* 210*  182* 253* 164*  --   BUN 87* 79* 74* 75* 69*  --   CREATININE 1.57* 1.42* 1.43* 1.62* 1.40*  --   CALCIUM 10.5* 9.2 9.3 9.0 9.1  --   MG  --   --   --   --  2.1  --    Liver Function Tests:  Recent Labs Lab 12/04/14 1424 12/04/14 1839  AST 44* 43*  ALT 69* 61*  ALKPHOS 63 58  BILITOT 1.0 1.4*  PROT 6.8 6.1*  ALBUMIN 3.7 3.3*   CBC:  Recent Labs Lab 12/04/14 1423 12/04/14 1839 12/05/14 0245 12/06/14 0233 12/07/14 0220  WBC 27.5* 22.1* 17.4* 20.2* 21.3*  NEUTROABS  --  21.0*  --   --   --   HGB 17.2* 15.7* 14.6 14.5 14.5  HCT 51.4* 48.2* 45.7 44.2 43.5  MCV 100.6* 101.3* 101.3* 99.8 98.6  PLT 185 180 178 162 161   BNP (last 3 results)  Recent Labs  10/14/14 0420 11/16/14 1851 12/04/14 1423  BNP 301.7* 160.9* 280.1*    ProBNP (last 3 results)  Recent Labs  02/22/14 0947 02/24/14 0332 03/07/14 0648  PROBNP 2319.0* 2410.0* 437.5    CBG: No results for input(s): GLUCAP in the last 168 hours.  Recent Results (from the past 240 hour(s))  Blood culture (routine x 2)     Status: None (Preliminary result)   Collection Time: 12/04/14  2:09 PM  Result Value Ref Range Status   Specimen Description BLOOD LEFT HAND  Final   Special Requests BOTTLES DRAWN AEROBIC AND ANAEROBIC 5CC  Final   Culture NO GROWTH 3 DAYS  Final   Report Status PENDING  Incomplete  Urine C&S     Status: None (Preliminary result)   Collection Time: 12/04/14  2:17 PM  Result Value Ref Range Status   Specimen Description URINE, CLEAN CATCH  Final   Special Requests NONE  Final   Culture   Final    >=100,000 COLONIES/mL PROTEUS MIRABILIS 30,000 COLONIES/mL GRAM NEGATIVE RODS    Report Status PENDING  Incomplete   Organism ID, Bacteria PROTEUS MIRABILIS  Final      Susceptibility   Proteus mirabilis - MIC*    AMPICILLIN <=2 SENSITIVE Sensitive     CEFAZOLIN 8 SENSITIVE Sensitive     CEFTRIAXONE <=1 SENSITIVE Sensitive     CIPROFLOXACIN <=0.25 SENSITIVE Sensitive     GENTAMICIN <=1  SENSITIVE Sensitive     IMIPENEM 4 SENSITIVE Sensitive     NITROFURANTOIN 256 RESISTANT Resistant     TRIMETH/SULFA <=20 SENSITIVE Sensitive     AMPICILLIN/SULBACTAM <=2 SENSITIVE Sensitive     PIP/TAZO <=4 SENSITIVE Sensitive     * >=100,000 COLONIES/mL PROTEUS MIRABILIS  Blood culture (routine x 2)     Status: None (Preliminary result)   Collection Time: 12/04/14  3:02 PM  Result Value Ref Range Status   Specimen Description BLOOD RIGHT ANTECUBITAL  Final   Special Requests BOTTLES DRAWN AEROBIC AND ANAEROBIC 5CC  Final   Culture NO GROWTH 3 DAYS  Final   Report Status PENDING  Incomplete  MRSA PCR Screening     Status: Abnormal   Collection Time: 12/04/14  4:45 PM  Result Value Ref Range Status  MRSA by PCR POSITIVE (A) NEGATIVE Final    Comment:        The GeneXpert MRSA Assay (FDA approved for NASAL specimens only), is one component of a comprehensive MRSA colonization surveillance program. It is not intended to diagnose MRSA infection nor to guide or monitor treatment for MRSA infections. CRITICAL RESULT CALLED TO, READ BACK BY AND VERIFIED WITH: M.NORMENT,RN 12/04/14  BY V.WILKINS      Studies: No results found.  Scheduled Meds: . allopurinol  300 mg Oral Daily  . aspirin EC  81 mg Oral Daily  . atorvastatin  10 mg Oral Daily  . budesonide  0.25 mg Nebulization BID  . cefTRIAXone (ROCEPHIN)  IV  1 g Intravenous Q24H  . [START ON 12/08/2014] diltiazem  300 mg Oral Daily  . escitalopram  20 mg Oral Daily  . furosemide  40 mg Oral BID  . gabapentin  300 mg Oral Daily  . nystatin  5 mL Oral QID  . potassium chloride  40 mEq Oral Daily  . predniSONE  40 mg Oral Q breakfast  . sodium chloride  3 mL Intravenous Q12H  . sodium chloride  3 mL Intravenous Q12H  . warfarin  2.5 mg Oral q1800  . Warfarin - Pharmacist Dosing Inpatient   Does not apply q1800   Continuous Infusions:    Principal Problem:   Sepsis Active Problems:   CKD (chronic kidney disease)  stage 3, GFR 30-59 ml/min   Interstitial lung disease   Debilitated   UTI (lower urinary tract infection)   Atrial fibrillation with RVR   FTT (failure to thrive) in adult    Time spent: 40 min (> 50% of the time dedicated to face to face examination, coordination of care and discussion with other specialist)    Vassie Loll  [facility] Pager [phone]. If 7PM-7AM, please contact night-coverage at [email or web address], password [facility] 12/07/2014, 4:03 PM  LOS: 3 days

## 2014-12-07 NOTE — Telephone Encounter (Signed)
Pts [employer] has been approved 08/27/14-05/30/15 Approval # [id number]

## 2014-12-07 NOTE — Progress Notes (Signed)
ANTICOAGULATION CONSULT NOTE - Follow Up Consult ANTIBIOTIC CONSULT NOTE - Follow Up Consult  Pharmacy Consult for warfarin, vancomycin, and ceftazidime Indication: atrial fibrillation and UTI  Allergies  Allergen Reactions  . Pirfenidone Other (See Comments)    Congestion, breathing diffulty    Patient Measurements: Height:  (162.6 cm) Weight: (!) 306 lb 1.6 oz (138.846 kg) IBW/kg (Calculated) : 54.7  Vital Signs: Temp: 97.4 F (36.3 C) (07/27 0829) Temp Source: Oral (07/27 0829) BP: 127/71 mmHg (07/27 0942) Pulse Rate: 87 (07/27 0900)  Labs:  Recent Labs  12/04/14 1839 12/05/14 0245 12/06/14 0233 12/07/14 0220 12/07/14 0355  HGB 15.7* 14.6 14.5 14.5  --   HCT 48.2* 45.7 44.2 43.5  --   PLT 180 178 162 161  --   APTT 28  --   --   --   --   LABPROT 23.7* 23.3* 21.8* 22.5*  --   INR 2.13* 2.08* 1.92* 1.99*  --   CREATININE 1.42* 1.43* 1.62*  --  1.40*   Recent Labs     12/05/14  0245  12/06/14  0233  12/07/14  0220  12/07/14  0355  WBC  17.4*  20.2*  21.3*   --   BUN  74*  75*   --   69*  CREATININE  1.43*  1.62*   --   1.40*    Estimated Creatinine Clearance: 43.9 mL/min (by C-G formula based on Cr of 1.4).   Medications:  Prescriptions prior to admission  Medication Sig Dispense Refill Last Dose  . albuterol (PROVENTIL) (2.5 MG/3ML) 0.083% nebulizer solution Take 3 mLs (2.5 mg total) by nebulization every 2 (two) hours as needed for wheezing or shortness of breath. 75 mL 12 12/04/2014 at Unknown time  . allopurinol (ZYLOPRIM) 300 MG tablet Take 1 tablet (300 mg total) by mouth daily. 30 tablet 11 12/04/2014 at Unknown time  . Alum & Mag Hydroxide-Simeth (MAGIC MOUTHWASH W/LIDOCAINE) SOLN Take 15 mLs by mouth 3 (three) times daily as needed for mouth pain. 15 mL 0 unk  . aspirin 81 MG tablet Take 81 mg by mouth daily.   12/04/2014 at Unknown time  . atorvastatin (LIPITOR) 10 MG tablet Take 1 tablet (10 mg total) by mouth daily. 30 tablet 5 12/04/2014 at  Unknown time  . budesonide (PULMICORT) 0.25 MG/2ML nebulizer solution Take 0.25 mg by nebulization 2 (two) times daily.   12/04/2014 at Unknown time  . cetirizine (ZYRTEC ALLERGY) 10 MG tablet Take 10 mg by mouth daily.    12/04/2014 at Unknown time  . diltiazem (CARDIZEM CD) 240 MG 24 hr capsule Take 1 capsule (240 mg total) by mouth daily. 30 capsule 0 12/04/2014 at Unknown time  . escitalopram (LEXAPRO) 20 MG tablet Take 1 tablet (20 mg total) by mouth daily. 30 tablet 5 12/04/2014 at Unknown time  . famotidine (PEPCID) 20 MG tablet Take 1 tablet (20 mg total) by mouth daily. 30 tablet 6 12/04/2014 at Unknown time  . ferrous sulfate 325 (65 FE) MG tablet Take 1 tablet (325 mg total) by mouth 2 (two) times daily with a meal. 60 tablet 0 12/04/2014 at Unknown time  . furosemide (LASIX) 40 MG tablet Take 1 tablet (40 mg total) by mouth 2 (two) times daily. 30 tablet 0 12/04/2014 at Unknown time  . gabapentin (NEURONTIN) 300 MG capsule Take 1 capsule (300 mg total) by mouth daily. 90 capsule 0 12/04/2014 at Unknown time  . metolazone (ZAROXOLYN) 5 MG tablet Take 1  tablet (5 mg total) by mouth daily. 30 tablet 1 12/04/2014 at Unknown time  . Multiple Vitamins-Minerals (MULTIVITAMIN & MINERAL PO) Take 1 tablet by mouth daily.   12/04/2014 at Unknown time  . potassium chloride SA (K-DUR,KLOR-CON) 20 MEQ tablet Take 2 tablets (40 mEq total) by mouth 2 (two) times daily.   12/04/2014 at Unknown time  . predniSONE (DELTASONE) 5 MG tablet 8 tablets daily 1 week then 3 tablets daily 1 month then 2 tablets daily 1 month then 1 tablet daily 1 month and stop 180 tablet 1 12/04/2014 at Unknown time  . warfarin (COUMADIN) 2.5 MG tablet Take 1 tablet (2.5 mg total) by mouth daily at 6 PM. 30 tablet 0 12/04/2014 at Unknown time    Assessment: 81 yo woman admitted 12/04/2014 for AFib with RVR, encephalopathy, & meeting sepsis criteria. Started on broad spectrum abx.  PMH end-stage pulmonary fibrosis, chronic diastolic  congestive heart failure, chronic hypoxemic respiratory failure, atrial fibrillation   Pharmacy consulted to dose warfarin.  AC/Heme: Afib, hx LLE DVT 2008 & 2010 INR 1.99 therapeutic, INR therapeutic at 2.13 on admission CBC stable On fluconazole, which may increase INR   PTA warfarin dose 2.5mg  qday   ID: sepsis, likely UTI source  wbc trending up to 21.3, remains afebrile  Day #4 abx 7/24 vanc> 7/24 Fortaz> 7/24 fluconazole>  7/24 BCx ngtd 7/24 UCx proteus mirabilis  Culture & Susceptibility    PROTEUS MIRABILIS    Antibiotic Sensitivity Microscan Status   AMPICILLIN Sensitive <=2 SENSITIVE Final   Method: MIC   AMPICILLIN/SULBACTAM Sensitive <=2 SENSITIVE Final   Method: MIC   CEFAZOLIN Sensitive 8 SENSITIVE Final   Method: MIC   CEFTRIAXONE Sensitive <=1 SENSITIVE Final   Method: MIC   CIPROFLOXACIN Sensitive <=0.25 SENSITIVE Final   Method: MIC   GENTAMICIN Sensitive <=1 SENSITIVE Final   Method: MIC   IMIPENEM Sensitive 4 SENSITIVE Final   Method: MIC   NITROFURANTOIN Resistant 256 RESISTANT Final   Method: MIC   PIP/TAZO Sensitive <=4 SENSITIVE Final   Method: MIC   TRIMETH/SULFA Sensitive <=20 SENSITIVE Final   Method: MIC   Comments PROTEUS MIRABILIS (MIC)   >=100,000 COLONIES/mL PROTEUS MIRABILIS           Narrow broad spectrum abx given proteus in urine cx. WBC rising, however could also be prednisone-related rather than infectious source. Currently on prednisone 40 mg, was on prednisone taper prior to admission.  Goal of Therapy:  INR 2-3 Monitor platelets by anticoagulation protocol: Yes   Plan:  Anticoagulation Continue warfarin 2.5 mg qday INR & CBC daily Monitor for s/sx bleeding   Antibiotics Consider d/c vancomycin and fluconazole Continue ceftazidime 2g q12h   [name] [name] [name] [name] 12/07/2014,10:03 AM

## 2014-12-07 NOTE — Evaluation (Signed)
Physical Therapy Evaluation Patient Details Name: [name] [name] MRN: [id number] DOB: [birth year] Today's Date: 12/07/2014   History of Present Illness  Patient is an 81 year old female with a past medical history of end-stage pulmonary fibrosis, chronic diastolic congestive heart failure, chronic hypoxemic respiratory failure, atrial fibrillation who was admitted to the medicine service on 12/04/2014 when she presented with complaints of a generalized weakness and functional decline. She had a recent hospitalization for acute on chronic hypoxemic respiratory failure believed to be secondary to acute flareup of end-stage interstitial lung disease and viral infection. She was discharged to inpatient rehabilitation. She was discharged from inpatient rehabilitation on 11/24/2014 and had been doing poorly at home. She was found to be in atrial fibrillation rapid ventricular response, encephalopathic, having sepsis criteria  Clinical Impression  Pt admitted with above diagnosis. Pt currently with functional limitations due to the deficits listed below (see PT Problem List). Pt unable to ambulate today due to elevated HR and desaturation.  Pt will benefit from skilled PT to increase their independence and safety with mobility to allow discharge to the venue listed below.       Follow Up Recommendations SNF, rec SNF based on pt's current medical and functional status, but she will likely refuse this, in which case, recommend HHPT.     Equipment Recommendations  None recommended by PT    Recommendations for Other Services OT consult     Precautions / Restrictions Precautions Precautions: Fall Precaution Comments: monitor O2 sats and HR Restrictions Weight Bearing Restrictions: No      Mobility  Bed Mobility               General bed mobility comments: received in chair  Transfers Overall transfer level: Needs assistance Equipment used: Rolling walker (2 wheeled) Transfers: Sit to/from  Stand Sit to Stand: Mod assist;Min assist         General transfer comment: mod A for power up and fwd translation with first sit to stand, min A second time from slightly higher position (pillow added to chair).   Ambulation/Gait             General Gait Details: unable due to HR up to 160 bpm in standing and O2 sats down to 82% on 5L O2  Stairs            Wheelchair Mobility    Modified Rankin (Stroke Patients Only)       Balance Overall balance assessment: Needs assistance Sitting-balance support: Feet supported Sitting balance-Leahy Scale: Good     Standing balance support: Single extremity supported Standing balance-Leahy Scale: Poor Standing balance comment: can let go of RW with one hand but not both. Requires UE support to maintain balance                             Pertinent Vitals/Pain Pain Assessment: No/denies pain    Home Living Family/patient expects to be discharged to:: Private residence Living Arrangements: Children Available Help at Discharge: Family;Available 24 hours/day Type of Home: House Home Access: Stairs to enter Entrance Stairs-Rails: Doctor, general practice of Steps: 2 Home Layout: One level Home Equipment: Shower seat - built in;Grab bars - tub/shower;Walker - 2 wheels;Transport chair;Bedside commode;Other (comment);Adaptive equipment Additional Comments: son, Thayer Ohm, lives with pt. two other sons local very supportive; has bed that elevates, lift chair, RW w/2 wheels, BSC, transport chair    Prior Function Level of Independence: Needs assistance   Gait /  Transfers Assistance Needed: Uses RW and has lift chair and elevated height bed.  Was home only 2 days from [facility] and was not doing well there before returning to [facility]  ADL's / Homemaking Assistance Needed: Has aide hired 5 hrs per day 5 days per week for cooking cleaning, and assist with LB bathing and dressing  Comments: reports independent with short  distance ambulation in the home, but usually someone there when walking to bathroom. since October has needed rest break on way to bathroom, but prior was able to walk all the way without rest     Hand Dominance   Dominant Hand: Right    Extremity/Trunk Assessment   Upper Extremity Assessment: Generalized weakness           Lower Extremity Assessment: Generalized weakness      Cervical / Trunk Assessment: Kyphotic  Communication   Communication: No difficulties  Cognition Arousal/Alertness: Awake/alert Behavior During Therapy: WFL for tasks assessed/performed Overall Cognitive Status: Within Functional Limits for tasks assessed                      General Comments General comments (skin integrity, edema, etc.): Needed 5 mins rest between bouts of standing to recover with HR down to 118 bpm    Exercises General Exercises - Lower Extremity Ankle Circles/Pumps: AROM;Both;10 reps Gluteal Sets: AROM;Both;10 reps;Seated Long Arc Quad: AROM;Both;10 reps;Seated Hip Flexion/Marching: AROM;Both;10 reps;Seated      Assessment/Plan    PT Assessment Patient needs continued PT services  PT Diagnosis Difficulty walking;Generalized weakness   PT Problem List Decreased strength;Decreased activity tolerance;Decreased balance;Decreased mobility;Decreased knowledge of use of DME;Decreased knowledge of precautions;Cardiopulmonary status limiting activity  PT Treatment Interventions DME instruction;Gait training;Therapeutic activities;Functional mobility training;Therapeutic exercise;Balance training;Patient/family education   PT Goals (Current goals can be found in the Care Plan section) Acute Rehab PT Goals Patient Stated Goal: to get stronger so i dont have to be a burden to my kids PT Goal Formulation: With patient Time For Goal Achievement: 12/21/14 Potential to Achieve Goals: Good    Frequency Min 3X/week   Barriers to discharge        Co-evaluation                End of Session Equipment Utilized During Treatment: Gait belt;Oxygen Activity Tolerance: Patient limited by fatigue Patient left: in chair;with call bell/phone within reach Nurse Communication: Mobility status         Time: 1610-9604 PT Time Calculation (min) (ACUTE ONLY): 34 min   Charges:   PT Evaluation $Initial PT Evaluation Tier I: 1 Procedure PT Treatments $Therapeutic Activity: 8-22 mins   PT G Codes:      Lyanne Co, PT  Acute Rehab Services  [phone]   Lyanne Co 12/07/2014, 3:56 PM

## 2014-12-08 ENCOUNTER — Ambulatory Visit: Payer: Medicare Other | Admitting: Family Medicine

## 2014-12-08 LAB — BASIC METABOLIC PANEL
ANION GAP: 11 (ref 5–15)
BUN: 63 mg/dL — AB (ref 6–20)
CO2: 39 mmol/L — AB (ref 22–32)
CREATININE: 1.43 mg/dL — AB (ref 0.44–1.00)
Calcium: 9 mg/dL (ref 8.9–10.3)
Chloride: 87 mmol/L — ABNORMAL LOW (ref 101–111)
GFR calc Af Amer: 39 mL/min — ABNORMAL LOW (ref >60)
GFR calc non Af Amer: 33 mL/min — ABNORMAL LOW (ref >60)
Glucose, Bld: 152 mg/dL — ABNORMAL HIGH (ref 65–99)
Potassium: 3.4 mmol/L — ABNORMAL LOW (ref 3.5–5.1)
SODIUM: 137 mmol/L (ref 135–145)

## 2014-12-08 LAB — URINE CULTURE: Culture: >=100000

## 2014-12-08 LAB — CBC
HEMATOCRIT: 45.1 % (ref 36.0–46.0)
HEMOGLOBIN: 14.8 g/dL (ref 12.0–15.0)
MCH: 32.9 pg (ref 26.0–34.0)
MCHC: 32.8 g/dL (ref 30.0–36.0)
MCV: 100.2 fL — AB (ref 78.0–100.0)
Platelets: 141 10*3/uL — ABNORMAL LOW (ref 150–400)
RBC: 4.5 MIL/uL (ref 3.87–5.11)
RDW: 15.4 % (ref 11.5–15.5)
WBC: 20.6 10*3/uL — ABNORMAL HIGH (ref 4.0–10.5)

## 2014-12-08 LAB — PROTIME-INR
INR: 2.24 — AB (ref 0.00–1.49)
PROTHROMBIN TIME: 24.6 s — AB (ref 11.6–15.2)

## 2014-12-08 MED ORDER — DILTIAZEM HCL ER COATED BEADS 360 MG PO CP24
360.0000 mg | ORAL_CAPSULE | Freq: Every day | ORAL | Status: DC
  Administered 2014-12-09 – 2014-12-13 (×5): 360 mg via ORAL
  Filled 2014-12-08 (×3): qty 1
  Filled 2014-12-08 (×2): qty 2
  Filled 2014-12-08: qty 1

## 2014-12-08 MED ORDER — METOPROLOL TARTRATE 12.5 MG HALF TABLET
12.5000 mg | ORAL_TABLET | Freq: Two times a day (BID) | ORAL | Status: DC
  Administered 2014-12-08 – 2014-12-13 (×12): 12.5 mg via ORAL
  Filled 2014-12-08 (×15): qty 1

## 2014-12-08 NOTE — Progress Notes (Signed)
ANTICOAGULATION CONSULT NOTE - Follow Up Consult  Pharmacy Consult for warfarin Indication: atrial fibrillation  Allergies  Allergen Reactions  . Pirfenidone Other (See Comments)    Congestion, breathing diffulty    Patient Measurements: Height: 5\' 4"  (162.6 cm) Weight: (!) 305 lb 6.4 oz (138.529 kg) IBW/kg (Calculated) : 54.7  Vital Signs: Temp: 98 F (36.7 C) (07/28 0751) Temp Source: Oral (07/28 0751) BP: 122/54 mmHg (07/28 0914) Pulse Rate: 108 (07/28 0800)  Labs:  Recent Labs  12/06/14 0233 12/07/14 0220 12/07/14 0355 12/08/14 0228  HGB 14.5 14.5  --  14.8  HCT 44.2 43.5  --  45.1  PLT 162 161  --  141*  LABPROT 21.8* 22.5*  --  24.6*  INR 1.92* 1.99*  --  2.24*  CREATININE 1.62*  --  1.40* 1.43*   Recent Labs     12/06/14  0233   12/07/14  0355  12/08/14  0228  WBC  20.2*   < >   --   20.6*  BUN  75*   --   69*  63*  CREATININE  1.62*   --   1.40*  1.43*   < > = values in this interval not displayed.    Estimated Creatinine Clearance: 43 mL/min (by C-G formula based on Cr of 1.43).   Medications:  Prescriptions prior to admission  Medication Sig Dispense Refill Last Dose  . albuterol (PROVENTIL) (2.5 MG/3ML) 0.083% nebulizer solution Take 3 mLs (2.5 mg total) by nebulization every 2 (two) hours as needed for wheezing or shortness of breath. 75 mL 12 12/04/2014 at Unknown time  . allopurinol (ZYLOPRIM) 300 MG tablet Take 1 tablet (300 mg total) by mouth daily. 30 tablet 11 12/04/2014 at Unknown time  . Alum & Mag Hydroxide-Simeth (MAGIC MOUTHWASH W/LIDOCAINE) SOLN Take 15 mLs by mouth 3 (three) times daily as needed for mouth pain. 15 mL 0 unk  . aspirin 81 MG tablet Take 81 mg by mouth daily.   12/04/2014 at Unknown time  . atorvastatin (LIPITOR) 10 MG tablet Take 1 tablet (10 mg total) by mouth daily. 30 tablet 5 12/04/2014 at Unknown time  . budesonide (PULMICORT) 0.25 MG/2ML nebulizer solution Take 0.25 mg by nebulization 2 (two) times daily.    12/04/2014 at Unknown time  . cetirizine (ZYRTEC ALLERGY) 10 MG tablet Take 10 mg by mouth daily.    12/04/2014 at Unknown time  . diltiazem (CARDIZEM CD) 240 MG 24 hr capsule Take 1 capsule (240 mg total) by mouth daily. 30 capsule 0 12/04/2014 at Unknown time  . escitalopram (LEXAPRO) 20 MG tablet Take 1 tablet (20 mg total) by mouth daily. 30 tablet 5 12/04/2014 at Unknown time  . famotidine (PEPCID) 20 MG tablet Take 1 tablet (20 mg total) by mouth daily. 30 tablet 6 12/04/2014 at Unknown time  . ferrous sulfate 325 (65 FE) MG tablet Take 1 tablet (325 mg total) by mouth 2 (two) times daily with a meal. 60 tablet 0 12/04/2014 at Unknown time  . furosemide (LASIX) 40 MG tablet Take 1 tablet (40 mg total) by mouth 2 (two) times daily. 30 tablet 0 12/04/2014 at Unknown time  . gabapentin (NEURONTIN) 300 MG capsule Take 1 capsule (300 mg total) by mouth daily. 90 capsule 0 12/04/2014 at Unknown time  . metolazone (ZAROXOLYN) 5 MG tablet Take 1 tablet (5 mg total) by mouth daily. 30 tablet 1 12/04/2014 at Unknown time  . Multiple Vitamins-Minerals (MULTIVITAMIN & MINERAL PO) Take 1 tablet  by mouth daily.   12/04/2014 at Unknown time  . potassium chloride SA (K-DUR,KLOR-CON) 20 MEQ tablet Take 2 tablets (40 mEq total) by mouth 2 (two) times daily.   12/04/2014 at Unknown time  . predniSONE (DELTASONE) 5 MG tablet 8 tablets daily 1 week then 3 tablets daily 1 month then 2 tablets daily 1 month then 1 tablet daily 1 month and stop 180 tablet 1 12/04/2014 at Unknown time  . warfarin (COUMADIN) 2.5 MG tablet Take 1 tablet (2.5 mg total) by mouth daily at 6 PM. 30 tablet 0 12/04/2014 at Unknown time    Assessment: 81 yo woman admitted 12/04/2014 for AFib with RVR, encephalopathy, & meeting sepsis criteria. Started on broad spectrum abx.  PMH end-stage pulmonary fibrosis, chronic diastolic congestive heart failure, chronic hypoxemic respiratory failure, atrial fibrillation   Pharmacy consulted to dose  warfarin.  AC/Heme: Afib, hx LLE DVT 2008 & 2010 INR 2.24 therapeutic, INR therapeutic at 2.13 on admission H/H stable, plt 141 Fluconazole d/c 7/27, however has extended half-life (~46h) in elderly, which still may increase INR   PTA warfarin dose 2.5mg  qday  Goal of Therapy:  INR 2-3 Monitor platelets by anticoagulation protocol: Yes   Plan:  Continue warfarin 2.5 mg qday INR & CBC daily Monitor for s/sx bleeding   Floria Raveling 12/08/2014,9:23 AM

## 2014-12-08 NOTE — Progress Notes (Signed)
Pt will call when she is ready to be placed on Bipap

## 2014-12-08 NOTE — Clinical Social Work Note (Signed)
Clinical Social Work Assessment  Patient Details  Name: [name] [name] MRN: [id number] Date of Birth: 29-Dec-1931  Date of referral:  12/08/14               Reason for consult:  Facility Placement                Permission sought to share information with:  Case Manager Permission granted to share information::  Yes, Verbal Permission Granted  Name::        Agency::     Relationship::     Contact Information:     Housing/Transportation Living arrangements for the past 2 months:  Single Family Home Source of Information:  Patient Patient Interpreter Needed:  None Criminal Activity/Legal Involvement Pertinent to Current Situation/Hospitalization:  No - Comment as needed Significant Relationships:  Adult Children Lives with:  Adult Children Do you feel safe going back to the place where you live?  Yes Need for family participation in patient care:  No (Coment)  Care giving concerns:  Pt needing assistance with ambulation/transfers   Social Worker assessment / plan:  CSW visited pt room to discuss PT recommendation. Pt informed CSW she would not under any circumstance dc to SNF. Pt understanding of recommendation but feels she is better off getting back to her own home. Pt son available for 24 hr supervision/assistance. Pt also has private care to help with grooming and house chores. Pt states she would like to be set up with home health services and needs nothing else. CSW agreed to notify RNCM of this decision. Pt has no further hospital social work needs. CSW signing off.  Employment status:  Retired Engineer, mining PT Recommendations:  Skilled Teacher, early years/pre / Referral to community resources:   (Pt refusing resources at this time)  Patient/Family's Response to care:  Pt refusing SNF at this time  Patient/Family's Understanding of and Emotional Response to Diagnosis, Current Treatment, and Prognosis:  Pt seems to have a good understanding of her  condition but unsure of pt compliance.  Emotional Assessment Appearance:  Appears stated age Attitude/Demeanor/Rapport:  Other (Cooperative) Affect (typically observed):  Calm, Pleasant Orientation:  Oriented to Self, Oriented to  Time, Oriented to Situation, Oriented to Place Alcohol / Substance use:  Not Applicable Psych involvement (Current and /or in the community):  No (Comment)  Discharge Needs  Concerns to be addressed:  Denies Needs/Concerns at this time Readmission within the last 30 days:  No Current discharge risk:  Dependent with Mobility Barriers to Discharge:  No Barriers Identified  [name] [name], LCSWA [phone]

## 2014-12-08 NOTE — Progress Notes (Addendum)
[facility] PROGRESS NOTE  MERSADIE KAVANAUGH VQQ:[id number] DOB: 05-09-1932 DOA: 12/04/2014 PCP: Kerby Nora, MD  Assessment/Plan: 1. Sepsis -Present on admission, evidenced by a white count of 20,000 thousand 500, respiratory 27, heart rate 150s, lactic acid of 4.15. -Source of infection could be urinary tract infection as u/a showed presence of pyuria and bacteria. -Patient showing clinical improvement with a downward trend in her white count to 17,400 from 22,000 on admission -She is currently hemodynamically stable and afebrile; will monitor clinical response -Plan to follow-up on blood cultures, continue antibiotic therapy now adjusted base on sensitivity  2.  Atrial fibrillation with rapid ventricular response. -Patient presented with A. fib with RVR having ventricular rates in the 140s to 150s. -She was started on a Cardizem drip currently having heart rates in the 90-110's -I suspect underlying infectious process and advanced lung disease precipitating A. fib with RVR -Heart rates better controlled now, but still elevated. Will adjust cardizem to 360mg  daily; if remains elevated will call cardiology to see her -CHADVASC score 5  3.  End-stage interstitial lung disease. -Patient having acute on chronic hypoxemic respiratory failure on admission with an increase in her oxygen requirements having a respiratory rate of 27. -Suspect patient have an acute flareup of interstitial lung disease -Chest x-ray repeated on 12/05/2014 remained stable. -Will continue tapering steroids slowly  4.  Chronic anticoagulation. -Pharmacy was consulted for Coumadin management -INR adjusted by pharmacy   5.  Chronic diastolic congestive heart failure -Transthoracic echocardiogram performed on 11/17/2014 showed preserved ejection fraction. Diuretics were held on admission due to sepsis and soft BP. -Creatinine trended up from 1.43 on 12/05/2014 to 1.62 on 12/06/2014.  -Will continue Lasix at 40 mg  by mouth twice a day, and continue monitoring her volume status and follow daily BMP's  6.  Proteus mirabilis and klebsiella Pneumoniae Urinary tract infection -Suspect may be cause of sepsis -following sensitivity will narrow antibiotics -patient on rocephin now  7.  Hypokalemia. -A.m. lab showing potassium in 2.7 range again -has been repleted and patient now started on daily maintenance potassium  8. Ginette Pitman  -will continue patient on nystatin   Code Status: Full code Family Communication: I spoke with her son last night Disposition Plan: Plan to transfer to telemetry once HR better controlled.   Antibiotics:  Vancomycin 7/24>>7/27  Elita Quick 7/24>>7/27  Rocephin 7/27  HPI/Subjective: Patient overall feeling better, denies CP and is no complaining of SOB. She is afebrile. Having trouble controlling her atrial fibrillation at this time.  Objective: Filed Vitals:   12/08/14 1147  BP: 137/70  Pulse: 61  Temp: 98.3 F (36.8 C)  Resp: 22    Intake/Output Summary (Last 24 hours) at 12/08/14 1427 Last data filed at 12/08/14 1339  Gross per 24 hour  Intake    440 ml  Output   1250 ml  Net   -810 ml   Filed Weights   12/06/14 0300 12/07/14 0351 12/08/14 0330  Weight: 135.762 kg (299 lb 4.8 oz) 138.846 kg (306 lb 1.6 oz) 138.529 kg (305 lb 6.4 oz)    Exam:   General:  Overall appears much better today, she is awake and alert, oriented X 3, in no acute distress, chronically ill-appearing and complaining of mouth pain (but better than on 7/27).  Cardiovascular: Irregular rate and rhythm, tachycardic, having bilateral 1-2+ lower extremity pitting edema (unchanged from baseline according to patient)  Respiratory: mild positive fine crackles bilaterally, few rhonchi, on supplemental oxygen, does not appear to  be in respiratory distress  Abdomen: Obese, soft nontender nondistended  Musculoskeletal: 1-2+ bilateral extremity pitting edema  Data Reviewed: Basic Metabolic  Panel:  Recent Labs Lab 12/04/14 1839 12/05/14 0245 12/06/14 0233 12/07/14 0355 12/07/14 1209 12/08/14 0228  NA 133* 140 134* 137  --  137  K 4.0 4.0 2.7* 2.7* 3.5 3.4*  CL 85* 91* 82* 89*  --  87*  CO2 36* 36* 37* 37*  --  39*  GLUCOSE 210* 182* 253* 164*  --  152*  BUN 79* 74* 75* 69*  --  63*  CREATININE 1.42* 1.43* 1.62* 1.40*  --  1.43*  CALCIUM 9.2 9.3 9.0 9.1  --  9.0  MG  --   --   --  2.1  --   --    Liver Function Tests:  Recent Labs Lab 12/04/14 1424 12/04/14 1839  AST 44* 43*  ALT 69* 61*  ALKPHOS 63 58  BILITOT 1.0 1.4*  PROT 6.8 6.1*  ALBUMIN 3.7 3.3*   CBC:  Recent Labs Lab 12/04/14 1839 12/05/14 0245 12/06/14 0233 12/07/14 0220 12/08/14 0228  WBC 22.1* 17.4* 20.2* 21.3* 20.6*  NEUTROABS 21.0*  --   --   --   --   HGB 15.7* 14.6 14.5 14.5 14.8  HCT 48.2* 45.7 44.2 43.5 45.1  MCV 101.3* 101.3* 99.8 98.6 100.2*  PLT 180 178 162 161 141*   BNP (last 3 results)  Recent Labs  10/14/14 0420 11/16/14 1851 12/04/14 1423  BNP 301.7* 160.9* 280.1*    ProBNP (last 3 results)  Recent Labs  02/22/14 0947 02/24/14 0332 03/07/14 0648  PROBNP 2319.0* 2410.0* 437.5    CBG: No results for input(s): GLUCAP in the last 168 hours.  Recent Results (from the past 240 hour(s))  Blood culture (routine x 2)     Status: None (Preliminary result)   Collection Time: 12/04/14  2:09 PM  Result Value Ref Range Status   Specimen Description BLOOD LEFT HAND  Final   Special Requests BOTTLES DRAWN AEROBIC AND ANAEROBIC 5CC  Final   Culture NO GROWTH 4 DAYS  Final   Report Status PENDING  Incomplete  Urine C&S     Status: None   Collection Time: 12/04/14  2:17 PM  Result Value Ref Range Status   Specimen Description URINE, CLEAN CATCH  Final   Special Requests NONE  Final   Culture   Final    >=100,000 COLONIES/mL PROTEUS MIRABILIS 30,000 COLONIES/mL KLEBSIELLA PNEUMONIAE    Report Status 12/08/2014 FINAL  Final   Organism ID, Bacteria PROTEUS  MIRABILIS  Final   Organism ID, Bacteria KLEBSIELLA PNEUMONIAE  Final      Susceptibility   Klebsiella pneumoniae - MIC*    AMPICILLIN >=32 RESISTANT Resistant     CEFAZOLIN <=4 SENSITIVE Sensitive     CEFTRIAXONE <=1 SENSITIVE Sensitive     CIPROFLOXACIN <=0.25 SENSITIVE Sensitive     GENTAMICIN <=1 SENSITIVE Sensitive     IMIPENEM <=0.25 SENSITIVE Sensitive     NITROFURANTOIN 64 INTERMEDIATE Intermediate     TRIMETH/SULFA <=20 SENSITIVE Sensitive     AMPICILLIN/SULBACTAM 8 SENSITIVE Sensitive     PIP/TAZO <=4 SENSITIVE Sensitive     * 30,000 COLONIES/mL KLEBSIELLA PNEUMONIAE   Proteus mirabilis - MIC*    AMPICILLIN <=2 SENSITIVE Sensitive     CEFAZOLIN 8 SENSITIVE Sensitive     CEFTRIAXONE <=1 SENSITIVE Sensitive     CIPROFLOXACIN <=0.25 SENSITIVE Sensitive     GENTAMICIN <=1 SENSITIVE Sensitive  IMIPENEM 4 SENSITIVE Sensitive     NITROFURANTOIN 256 RESISTANT Resistant     TRIMETH/SULFA <=20 SENSITIVE Sensitive     AMPICILLIN/SULBACTAM <=2 SENSITIVE Sensitive     PIP/TAZO <=4 SENSITIVE Sensitive     * >=100,000 COLONIES/mL PROTEUS MIRABILIS  Blood culture (routine x 2)     Status: None (Preliminary result)   Collection Time: 12/04/14  3:02 PM  Result Value Ref Range Status   Specimen Description BLOOD RIGHT ANTECUBITAL  Final   Special Requests BOTTLES DRAWN AEROBIC AND ANAEROBIC 5CC  Final   Culture NO GROWTH 4 DAYS  Final   Report Status PENDING  Incomplete  MRSA PCR Screening     Status: Abnormal   Collection Time: 12/04/14  4:45 PM  Result Value Ref Range Status   MRSA by PCR POSITIVE (A) NEGATIVE Final    Comment:        The GeneXpert MRSA Assay (FDA approved for NASAL specimens only), is one component of a comprehensive MRSA colonization surveillance program. It is not intended to diagnose MRSA infection nor to guide or monitor treatment for MRSA infections. CRITICAL RESULT CALLED TO, READ BACK BY AND VERIFIED WITH: M.NORMENT,RN 12/04/14  BY  V.WILKINS      Studies: No results found.  Scheduled Meds: . allopurinol  300 mg Oral Daily  . aspirin EC  81 mg Oral Daily  . atorvastatin  10 mg Oral Daily  . budesonide  0.25 mg Nebulization BID  . cefTRIAXone (ROCEPHIN)  IV  1 g Intravenous Q24H  . diltiazem  300 mg Oral Daily  . escitalopram  20 mg Oral Daily  . furosemide  40 mg Oral BID  . gabapentin  300 mg Oral Daily  . nystatin  5 mL Oral QID  . potassium chloride  40 mEq Oral Daily  . predniSONE  40 mg Oral Q breakfast  . sodium chloride  3 mL Intravenous Q12H  . sodium chloride  3 mL Intravenous Q12H  . warfarin  2.5 mg Oral q1800  . Warfarin - Pharmacist Dosing Inpatient   Does not apply q1800   Continuous Infusions:    Principal Problem:   Sepsis Active Problems:   CKD (chronic kidney disease) stage 3, GFR 30-59 ml/min   Interstitial lung disease   Debilitated   UTI (lower urinary tract infection)   Atrial fibrillation with RVR   FTT (failure to thrive) in adult    Time spent: 40 min (> 50% of the time dedicated to face to face examination, coordination of care and discussion with other specialist)    Vassie Loll  [facility] Pager [phone]. If 7PM-7AM, please contact night-coverage at [email or web address], password [email or web address] 12/08/2014, 2:27 PM  LOS: 4 days

## 2014-12-09 DIAGNOSIS — I5032 Chronic diastolic (congestive) heart failure: Secondary | ICD-10-CM

## 2014-12-09 DIAGNOSIS — I482 Chronic atrial fibrillation: Secondary | ICD-10-CM

## 2014-12-09 LAB — CULTURE, BLOOD (ROUTINE X 2)
Culture: NO GROWTH
Culture: NO GROWTH

## 2014-12-09 LAB — PROTIME-INR
INR: 2.59 — ABNORMAL HIGH (ref 0.00–1.49)
Prothrombin Time: 27.4 seconds — ABNORMAL HIGH (ref 11.6–15.2)

## 2014-12-09 MED ORDER — PREDNISONE 20 MG PO TABS
30.0000 mg | ORAL_TABLET | Freq: Every day | ORAL | Status: DC
  Administered 2014-12-10 – 2014-12-29 (×20): 30 mg via ORAL
  Filled 2014-12-09 (×25): qty 1

## 2014-12-09 MED ORDER — WARFARIN 1.25 MG HALF TABLET
1.2500 mg | ORAL_TABLET | Freq: Once | ORAL | Status: AC
Start: 2014-12-09 — End: 2014-12-09
  Administered 2014-12-09: 1.25 mg via ORAL
  Filled 2014-12-09: qty 1

## 2014-12-09 NOTE — Progress Notes (Signed)
Physical Therapy Treatment Patient Details Name: MARCI POLITO MRN: [id number] DOB: 1931-06-13 Today's Date: 12/09/2014    History of Present Illness Patient is an 81 year old female with a past medical history of end-stage pulmonary fibrosis, chronic diastolic congestive heart failure, chronic hypoxemic respiratory failure, atrial fibrillation who was admitted to the medicine service on 12/04/2014 when she presented with complaints of a generalized weakness and functional decline. She had a recent hospitalization for acute on chronic hypoxemic respiratory failure believed to be secondary to acute flareup of end-stage interstitial lung disease and viral infection. She was discharged to inpatient rehabilitation. She was discharged from inpatient rehabilitation on 11/24/2014 and had been doing poorly at home. She was found to be in atrial fibrillation rapid ventricular response, encephalopathic, having sepsis criteria    PT Comments    Pt with Afib with HR highly variable at rest 95-140 with majority of HEP HR 111-135, sats 93% 6L. Pt reports SOB throughout with frequent rest breaks and cues for breathing technique. Deferred mobility at this time secondary to HR. Pt adamant to return home with family and recommend 24hr assist, HHPT and will continue to follow to maximize function and decrease burden of care.   Follow Up Recommendations  Home health PT;Supervision/Assistance - 24 hour (pt refusing SNF)     Equipment Recommendations       Recommendations for Other Services       Precautions / Restrictions Precautions Precautions: Fall Precaution Comments: monitor O2 sats and HR    Mobility  Bed Mobility               General bed mobility comments: pt in chair throughout session. Did not attempt mobility secondary to Afib and pt fatigue with HEP  Transfers                    Ambulation/Gait                 Stairs            Wheelchair Mobility     Modified Rankin (Stroke Patients Only)       Balance                                    Cognition Arousal/Alertness: Awake/alert Behavior During Therapy: WFL for tasks assessed/performed Overall Cognitive Status: Within Functional Limits for tasks assessed                      Exercises General Exercises - Lower Extremity Long Arc Quad: AROM;Both;Seated;15 reps Hip ABduction/ADduction: AROM;Seated;Both;15 reps Hip Flexion/Marching: AROM;Both;Seated;15 reps Toe Raises: AROM;Seated;Both;15 reps Heel Raises: AROM;Seated;Both;15 reps    General Comments        Pertinent Vitals/Pain Pain Assessment: No/denies pain    Home Living                      Prior Function            PT Goals (current goals can now be found in the care plan section) Progress towards PT goals: Not progressing toward goals - comment (limited by cardiopulmonary function)    Frequency  Min 3X/week    PT Plan Discharge plan needs to be updated    Co-evaluation             End of Session Equipment Utilized During Treatment: Oxygen Activity Tolerance: Patient limited by fatigue (pt  limited by cardiopulmonary function) Patient left: in chair;with call bell/phone within reach     Time: 1150-1207 PT Time Calculation (min) (ACUTE ONLY): 17 min  Charges:  $Therapeutic Exercise: 8-22 mins                    G Codes:      Delorse Lek 03-Jan-2015, 1:42 PM Delaney Meigs, PT [phone]

## 2014-12-09 NOTE — Progress Notes (Signed)
Pt had dark red/brown smear. Could not tell if pt has hemorrhoids. Hgb stable. VSS. No other signs of bleeding. MD notified. Will continue to monitor pt closely. Jillyn Hidden, RN

## 2014-12-09 NOTE — Care Management Important Message (Signed)
Important Message  Patient Details  Name: [name] [name] MRN: [id number] Date of Birth: May 21, 1931   Medicare Important Message Given:  Yes-third notification given    Hanley Hays, RN 12/09/2014, 9:10 AM

## 2014-12-09 NOTE — Progress Notes (Signed)
ANTICOAGULATION CONSULT NOTE - Follow Up Consult  Pharmacy Consult for Coumadin Indication: atrial fibrillation and hx DVT  Allergies  Allergen Reactions  . Pirfenidone Other (See Comments)    Congestion, breathing diffulty    Patient Measurements: Height:  (162.6 cm) Weight: (!) 305 lb 6.4 oz (138.529 kg) IBW/kg (Calculated) : 54.7  Vital Signs: Temp: 97.5 F (36.4 C) (07/29 0748) Temp Source: Oral (07/29 0748) BP: 120/75 mmHg (07/29 0748) Pulse Rate: 53 (07/29 0748)  Labs:  Recent Labs  12/07/14 0220 12/07/14 0355 12/08/14 0228 12/09/14 0224  HGB 14.5  --  14.8  --   HCT 43.5  --  45.1  --   PLT 161  --  141*  --   LABPROT 22.5*  --  24.6* 27.4*  INR 1.99*  --  2.24* 2.59*  CREATININE  --  1.40* 1.43*  --     Estimated Creatinine Clearance: 43 mL/min (by C-G formula based on Cr of 1.43).  Assessment: 83yof continues on coumadin for hx afib and DVT. INR is therapeutic but trending up quickly on her home dose of 2.5mg  daily, likely due to the 3 doses of fluconazole she received 7/25-7/27 (half-life of fluconazole extended in elderly ~ 46 hours). Will decrease tonight's dose. No bleeding reported.  Goal of Therapy:  INR 2-3 Monitor platelets by anticoagulation protocol: Yes   Plan:  1) Coumadin 1.25mg  x 1 2) INR in AM  Fredrik Rigger 12/09/2014,8:33 AM

## 2014-12-09 NOTE — Progress Notes (Signed)
[facility] PROGRESS NOTE  [name] [name] ZOX:[id number] DOB: 27-Sep-1931 DOA: 12/04/2014 PCP: Kerby Nora, MD  Assessment/Plan: 1. Sepsis -Present on admission, evidenced by a white count of 20,000 thousand 500, respiratory 27, heart rate 150s, lactic acid of 4.15. -Source of infection could be urinary tract infection as u/a showed presence of pyuria and bacteria. -Patient showing clinical improvement with a downward trend in her white count to 17,400 from 22,000 on admission -She is currently hemodynamically stable and afebrile; will monitor clinical response -Plan to follow-up on blood cultures, continue antibiotic therapy now adjusted base on sensitivity  2.  Atrial fibrillation with rapid ventricular response. -Patient presented with A. fib with RVR having ventricular rates in the 140s to 150s. -She was started on a Cardizem drip currently having heart rates in the 90-110's -I suspect underlying infectious process and advanced lung disease precipitating A. fib with RVR -Heart rates better controlled now, but still elevated. Will adjust cardizem to 360mg  daily and add low dose of metoprolol. Cardiology consulted for further rec's -CHADVASC score 5  3.  End-stage interstitial lung disease. -Patient having acute on chronic hypoxemic respiratory failure on admission with an increase in her oxygen requirements having a respiratory rate of 27. -Suspect patient have an acute flareup of interstitial lung disease -Chest x-ray repeated on 12/05/2014 remained stable. -Will continue tapering steroids slowly -velcro signs appreciated on exam; no wheezing and patient without SOB  4.  Chronic anticoagulation. -Pharmacy was consulted for Coumadin management -INR adjusted by pharmacy and therapeutic currently  5.  Chronic diastolic congestive heart failure -Transthoracic echocardiogram performed on 11/17/2014 showed preserved ejection fraction. Diuretics were held on admission due to sepsis  and soft BP. -Creatinine trended up from 1.43 on 12/05/2014 to 1.62 on 12/06/2014 and down again to 1.43 on 7/28 -will follow BMET in am -Will continue Lasix at 40 mg by mouth twice a day, and continue monitoring her volume status and follow daily BMP's  6.  Proteus mirabilis and klebsiella Pneumoniae Urinary tract infection -Suspect may be cause of sepsis -following sensitivity will continue narrowed antibiotics; patient on rocephin now -no fever and no dysuria   7.  Hypokalemia. -A.m. lab showing potassium in 3.4 range again -has been repleted and patient now started on daily maintenance potassium -will check Mg and BMET in am  8. Ginette Pitman  -will continue patient on nystatin  -improving  Code Status: Full code Family Communication: I spoke with her son last night Disposition Plan: Plan to transfer to telemetry once HR better controlled.  Consultation:  Cardiology   Antibiotics:  Vancomycin 7/24>>7/27  Elita Quick 7/24>>7/27  Rocephin 7/27  HPI/Subjective: Patient overall feeling better, denies CP and is no complaining of SOB. She is afebrile. Still Having trouble controlling her atrial fibrillation at this time.  Objective: Filed Vitals:   12/09/14 1339  BP:   Pulse: 128  Temp:   Resp:     Intake/Output Summary (Last 24 hours) at 12/09/14 1344 Last data filed at 12/09/14 1237  Gross per 24 hour  Intake    533 ml  Output   1000 ml  Net   -467 ml   Filed Weights   12/06/14 0300 12/07/14 0351 12/08/14 0330  Weight: 135.762 kg (299 lb 4.8 oz) 138.846 kg (306 lb 1.6 oz) 138.529 kg (305 lb 6.4 oz)    Exam:   General:  Overall feeling much better today, she is awake and alert, oriented X 3, in no acute distress, chronically ill-appearing and with improvement  in her thrush. HR still up (especially with minimal exertion)  Cardiovascular: Irregular rate and rhythm, tachycardic, having bilateral 1-2+ lower extremity pitting edema (unchanged from baseline according to  patient)  Respiratory: mild positive fine crackles bilaterally, few rhonchi, on supplemental oxygen, does not appear to be in respiratory distress  Abdomen: Obese, soft nontender nondistended  Musculoskeletal: 1-2+ bilateral extremity pitting edema  Data Reviewed: Basic Metabolic Panel:  Recent Labs Lab 12/04/14 1839 12/05/14 0245 12/06/14 0233 12/07/14 0355 12/07/14 1209 12/08/14 0228  NA 133* 140 134* 137  --  137  K 4.0 4.0 2.7* 2.7* 3.5 3.4*  CL 85* 91* 82* 89*  --  87*  CO2 36* 36* 37* 37*  --  39*  GLUCOSE 210* 182* 253* 164*  --  152*  BUN 79* 74* 75* 69*  --  63*  CREATININE 1.42* 1.43* 1.62* 1.40*  --  1.43*  CALCIUM 9.2 9.3 9.0 9.1  --  9.0  MG  --   --   --  2.1  --   --    Liver Function Tests:  Recent Labs Lab 12/04/14 1424 12/04/14 1839  AST 44* 43*  ALT 69* 61*  ALKPHOS 63 58  BILITOT 1.0 1.4*  PROT 6.8 6.1*  ALBUMIN 3.7 3.3*   CBC:  Recent Labs Lab 12/04/14 1839 12/05/14 0245 12/06/14 0233 12/07/14 0220 12/08/14 0228  WBC 22.1* 17.4* 20.2* 21.3* 20.6*  NEUTROABS 21.0*  --   --   --   --   HGB 15.7* 14.6 14.5 14.5 14.8  HCT 48.2* 45.7 44.2 43.5 45.1  MCV 101.3* 101.3* 99.8 98.6 100.2*  PLT 180 178 162 161 141*   BNP (last 3 results)  Recent Labs  10/14/14 0420 11/16/14 1851 12/04/14 1423  BNP 301.7* 160.9* 280.1*    ProBNP (last 3 results)  Recent Labs  02/22/14 0947 02/24/14 0332 03/07/14 0648  PROBNP 2319.0* 2410.0* 437.5    CBG: No results for input(s): GLUCAP in the last 168 hours.  Recent Results (from the past 240 hour(s))  Blood culture (routine x 2)     Status: None (Preliminary result)   Collection Time: 12/04/14  2:09 PM  Result Value Ref Range Status   Specimen Description BLOOD LEFT HAND  Final   Special Requests BOTTLES DRAWN AEROBIC AND ANAEROBIC 5CC  Final   Culture NO GROWTH 4 DAYS  Final   Report Status PENDING  Incomplete  Urine C&S     Status: None   Collection Time: 12/04/14  2:17 PM  Result  Value Ref Range Status   Specimen Description URINE, CLEAN CATCH  Final   Special Requests NONE  Final   Culture   Final    >=100,000 COLONIES/mL PROTEUS MIRABILIS 30,000 COLONIES/mL KLEBSIELLA PNEUMONIAE    Report Status 12/08/2014 FINAL  Final   Organism ID, Bacteria PROTEUS MIRABILIS  Final   Organism ID, Bacteria KLEBSIELLA PNEUMONIAE  Final      Susceptibility   Klebsiella pneumoniae - MIC*    AMPICILLIN >=32 RESISTANT Resistant     CEFAZOLIN <=4 SENSITIVE Sensitive     CEFTRIAXONE <=1 SENSITIVE Sensitive     CIPROFLOXACIN <=0.25 SENSITIVE Sensitive     GENTAMICIN <=1 SENSITIVE Sensitive     IMIPENEM <=0.25 SENSITIVE Sensitive     NITROFURANTOIN 64 INTERMEDIATE Intermediate     TRIMETH/SULFA <=20 SENSITIVE Sensitive     AMPICILLIN/SULBACTAM 8 SENSITIVE Sensitive     PIP/TAZO <=4 SENSITIVE Sensitive     * 30,000 COLONIES/mL KLEBSIELLA PNEUMONIAE  Proteus mirabilis - MIC*    AMPICILLIN <=2 SENSITIVE Sensitive     CEFAZOLIN 8 SENSITIVE Sensitive     CEFTRIAXONE <=1 SENSITIVE Sensitive     CIPROFLOXACIN <=0.25 SENSITIVE Sensitive     GENTAMICIN <=1 SENSITIVE Sensitive     IMIPENEM 4 SENSITIVE Sensitive     NITROFURANTOIN 256 RESISTANT Resistant     TRIMETH/SULFA <=20 SENSITIVE Sensitive     AMPICILLIN/SULBACTAM <=2 SENSITIVE Sensitive     PIP/TAZO <=4 SENSITIVE Sensitive     * >=100,000 COLONIES/mL PROTEUS MIRABILIS  Blood culture (routine x 2)     Status: None (Preliminary result)   Collection Time: 12/04/14  3:02 PM  Result Value Ref Range Status   Specimen Description BLOOD RIGHT ANTECUBITAL  Final   Special Requests BOTTLES DRAWN AEROBIC AND ANAEROBIC 5CC  Final   Culture NO GROWTH 4 DAYS  Final   Report Status PENDING  Incomplete  MRSA PCR Screening     Status: Abnormal   Collection Time: 12/04/14  4:45 PM  Result Value Ref Range Status   MRSA by PCR POSITIVE (A) NEGATIVE Final    Comment:        The GeneXpert MRSA Assay (FDA approved for NASAL specimens only),  is one component of a comprehensive MRSA colonization surveillance program. It is not intended to diagnose MRSA infection nor to guide or monitor treatment for MRSA infections. CRITICAL RESULT CALLED TO, READ BACK BY AND VERIFIED WITH: M.NORMENT,RN 12/04/14 @2010  BY V.WILKINS      Studies: No results found.  Scheduled Meds: . allopurinol  300 mg Oral Daily  . atorvastatin  10 mg Oral Daily  . budesonide  0.25 mg Nebulization BID  . cefTRIAXone (ROCEPHIN)  IV  1 g Intravenous Q24H  . diltiazem  360 mg Oral Daily  . escitalopram  20 mg Oral Daily  . furosemide  40 mg Oral BID  . gabapentin  300 mg Oral Daily  . metoprolol tartrate  12.5 mg Oral BID  . nystatin  5 mL Oral QID  . potassium chloride  40 mEq Oral Daily  . predniSONE  40 mg Oral Q breakfast  . sodium chloride  3 mL Intravenous Q12H  . sodium chloride  3 mL Intravenous Q12H  . warfarin  1.25 mg Oral ONCE-1800  . warfarin  2.5 mg Oral q1800  . Warfarin - Pharmacist Dosing Inpatient   Does not apply q1800   Continuous Infusions:    Principal Problem:   Sepsis Active Problems:   CKD (chronic kidney disease) stage 3, GFR 30-59 ml/min   Interstitial lung disease   Debilitated   UTI (lower urinary tract infection)   Atrial fibrillation with RVR   FTT (failure to thrive) in adult    Time spent: 40 min (> 50% of the time dedicated to face to face examination, coordination of care and discussion with other specialist)    Vassie Loll  [facility] Pager [phone]. If 7PM-7AM, please contact night-coverage at [email or web address], password [facility] 12/09/2014, 1:44 PM  LOS: 5 days

## 2014-12-09 NOTE — Consult Note (Signed)
CARDIOLOGY CONSULT NOTE   Patient ID: CAMBELLE SUCHECKI MRN: [id number], DOB/AGE: [birth year]   Admit date: 12/04/2014 Date of Consult: 12/09/2014   Primary Physician: Kerby Nora, MD Primary Cardiologist: Dr. Mariah Milling  Pt. Profile  81 year old morbidly obese woman with interstitial lung disease on chronic prednisone, recently readmitted with sepsis.  Background history of chronic diastolic heart failure and chronic atrial fibrillation on warfarin.  Problem List  Past Medical History  Diagnosis Date  . Congestive heart failure, unspecified     a. ECHO 6/0: EF 55-60%, mild LVH, mildly dilated RV w. mildly dec fx, RVSP 67  . Chronic atrial fibrillation     failed cardioversion in past  -repeat DC-CV on October 5th, 2010  . Personal history of DVT (deep vein thrombosis) 2008    LLE  . HTN (hypertension)   . Morbid obesity   . Shingles     with postherpetic neuraigia  . GERD (gastroesophageal reflux disease)   . Hyperlipidemia   . Meningitis ~ 1960    hospitalized @ [facility]  . Gallstones     s/p cholecystectomy  . CAD (coronary artery disease)     nonobstructive by cath 8/10 - LAD 40-50% w mild PAH mean 29 w PVR 3.2 [place]  . PE (pulmonary embolism) ~ 2010    LLE  . Shortness of breath dyspnea   . Lung interstitial disease   . Hypoxemia   . COPD (chronic obstructive pulmonary disease)   . On home oxygen therapy     "5L; 24/7" (10/11/2014)  . OSA treated with BiPAP     "I think it's BiPAP"  . AAA (abdominal aortic aneurysm)     small  . History of blood transfusion   . Anemia   . Iron deficiency anemia   . History of blood transfusion     "related to nose bleed"  . Gout   . Depression   . Chronic kidney disease (CKD), stage II (mild)     Past Surgical History  Procedure Laterality Date  . Appendectomy  1947  . Tonsillectomy  1946  . Cardioversion  01/2007    x2  . Right heart catheterization N/A 02/28/2014    Procedure: RIGHT HEART CATH;  Surgeon: Laurey Morale, MD;  Location: [facility] CATH LAB;  Service: Cardiovascular;  Laterality: N/A;  . Cardiac catheterization  02/2014  . Cataract extraction w/ intraocular lens  implant, bilateral Bilateral      Allergies  Allergies  Allergen Reactions  . Pirfenidone Other (See Comments)    Congestion, breathing diffulty    HPI   This pleasant 81 year old woman was recently admitted to the hospital on 11/16/2014 for exacerbation of her chronic diastolic heart failure and her chronic interstitial lung disease.  She was also suspected of having a superimposed acute viral pneumonitis.  She was treated with diuresis and higher doses of steroids with improvement.  [facility] prior to discharge on 12/01/14.  During her hospitalization she had an update of her echocardiogram on 11/17/14 which showed ejection fraction of 55-60% and pulmonary artery pressure of 55.  She was home for only 2 days and then readmitted on 12/04/14 with sepsis secondary to urinary tract infection. The patient has a history of chronic atrial fibrillation.  She has failed prior cardioversions.  She has been treated at home with rate control with diltiazem CD 240 mg and with chronic Coumadin anticoagulation which is monitored at Dr. Daphine Deutscher office.  Dr.Gollan Is her cardiologist . She does not have any history of ischemic heart disease.  He does have a history of interstitial lung disease and is followed by Dr. Kathrin Penner.  She has required high dose prednisone which has also contributed to her tendency toward fluid retention and weight gain. She has a remote history of DVT with pulmonary embolus. She lives at home.  Her son lives with her.  Her husband died in 10/14/2008.  The patient has help at home.  The patient is on 5 L of oxygen continuously at home because of her interstitial lung disease and pulmonary fibrosis.   Inpatient Medications  . allopurinol  300 mg Oral Daily  . aspirin EC  81 mg Oral  Daily  . atorvastatin  10 mg Oral Daily  . budesonide  0.25 mg Nebulization BID  . cefTRIAXone (ROCEPHIN)  IV  1 g Intravenous Q24H  . diltiazem  360 mg Oral Daily  . escitalopram  20 mg Oral Daily  . furosemide  40 mg Oral BID  . gabapentin  300 mg Oral Daily  . metoprolol tartrate  12.5 mg Oral BID  . nystatin  5 mL Oral QID  . potassium chloride  40 mEq Oral Daily  . predniSONE  40 mg Oral Q breakfast  . sodium chloride  3 mL Intravenous Q12H  . sodium chloride  3 mL Intravenous Q12H  . warfarin  1.25 mg Oral ONCE-1800  . warfarin  2.5 mg Oral q1800  . Warfarin - Pharmacist Dosing Inpatient   Does not apply q1800    Family History Family History  Problem Relation Age of Onset  . Heart failure Mother   . Depression Mother   . Ovarian cancer Paternal Grandmother   . Heart disease Paternal Grandfather   . Obesity Other      Social History History   Social History  . Marital Status: Widowed    Spouse Name: N/A  . Number of Children: N/A  . Years of Education: N/A   Occupational History  . Not on file.   Social History Main Topics  . Smoking status: Never Smoker   . Smokeless tobacco: Never Used  . Alcohol Use: Yes     Comment: 10/11/2014 "used to have a drink on holidays; don't drink at all now"  . Drug Use: No  . Sexual Activity: No   Other Topics Concern  . Not on file   Social History Narrative   Widowed. Retired from Teacher, English as a foreign language.     Review of Systems  General:  No chills, fever, night sweats or weight changes.  Cushingoid features and easy bruising because of chronic steroids therapy  Cardiovascular:  No chest pain.  Chronic dyspnea.. Dermatological: No rash, lesions/masses Respiratory: No cough, dyspnea Urologic: No hematuria, dysuria Abdominal:   No nausea, vomiting, diarrhea, bright red blood per rectum, melena, or hematemesis Neurologic:  No visual changes, wkns, changes in mental status. All other systems reviewed and are otherwise negative  except as noted above.  Physical Exam  Blood pressure 120/75, pulse 131, temperature 97.5 F (36.4 C), temperature source Oral, resp. rate 26, height 5\' 4"  (1.626 m), weight 305 lb 6.4 oz (138.529 kg), SpO2 93 %.  General: Pleasant, NAD.  Nasal oxygen in place.  Multiple ecchymotic areas on skin Psych: Normal affect. Neuro: Alert and oriented X 3. Moves all extremities spontaneously. HEENT: Normal.  Mouth reveals thrush  Neck: Supple without bruits or JVD. Lungs:  Fine inspiratory bibasilar rales consistent with  pulmonary fibrosis.  Mild expiratory wheezing Heart: Rapid irregular pulse.  no s3, s4, or murmurs. Abdomen: Obese, nontender  Extremities: 1+ bilateral pretibial and pedal edema.  Pedal pulses are present.  Labs  No results for input(s): CKTOTAL, CKMB, TROPONINI in the last 72 hours. Lab Results  Component Value Date   WBC 20.6* 12/08/2014   HGB 14.8 12/08/2014   HCT 45.1 12/08/2014   MCV 100.2* 12/08/2014   PLT 141* 12/08/2014    Recent Labs Lab 12/04/14 1839  12/08/14 0228  NA 133*  < > 137  K 4.0  < > 3.4*  CL 85*  < > 87*  CO2 36*  < > 39*  BUN 79*  < > 63*  CREATININE 1.42*  < > 1.43*  CALCIUM 9.2  < > 9.0  PROT 6.1*  --   --   BILITOT 1.4*  --   --   ALKPHOS 58  --   --   ALT 61*  --   --   AST 43*  --   --   GLUCOSE 210*  < > 152*  < > = values in this interval not displayed. Lab Results  Component Value Date   CHOL 115 03/03/2014   HDL 49 03/03/2014   LDLCALC 44 03/03/2014   TRIG 110 03/03/2014   Lab Results  Component Value Date   DDIMER  02/10/2009    0.33        AT THE INHOUSE ESTABLISHED CUTOFF VALUE OF 0.48 ug/mL FEU, THIS ASSAY HAS BEEN DOCUMENTED IN THE LITERATURE TO HAVE A SENSITIVITY AND NEGATIVE PREDICTIVE VALUE OF AT LEAST 98 TO 99%.  THE TEST RESULT SHOULD BE CORRELATED WITH AN ASSESSMENT OF THE CLINICAL PROBABILITY OF DVT / VTE.    Radiology/Studies  Dg Chest 2 View  12/05/2014   CLINICAL DATA:  Sepsis, CHF,  interstitial lung disease, chronic renal insufficiency, failure to thrive.  EXAM: CHEST  2 VIEW  COMPARISON:  Portable chest x-ray of December 04, 2014  FINDINGS: The lungs are borderline hypoinflated especially on the right. This is stable. The interstitial markings remain coarse. There is pleural fluid versus pleural thickening in the apices and along the right lateral thoracic wall which is also stable. The lateral aspect of the left hemidiaphragm remains obscured. The cardiac silhouette is enlarged. The pulmonary vascularity is engorged. The bony thorax exhibits no acute abnormality. There is moderate degenerative disc disease of the thoracic spine.  IMPRESSION: CHF with mild pulmonary interstitial edema. Biapical pleural fluid or pleural thickening. Left lower lobe atelectasis or pneumonia laterally, stable.   Electronically Signed   By: David  [place] M.D.   On: 12/05/2014 07:50   Dg Chest 2 View  11/17/2014   CLINICAL DATA:  Acute respiratory failure and hypoxia  EXAM: CHEST  2 VIEW  COMPARISON:  Portable chest x-ray of November 16, 2014  FINDINGS: AP and lateral semi-upright views of the chest are reviewed. The lungs are reasonably well inflated. There is pleural fluid versus pleural thickening along the periphery of the pleural surface surrounding the right lung. This is stable. The pulmonary interstitial markings remain increased. There are confluent alveolar opacities bilaterally which are less conspicuous today. The cardiac silhouette is enlarged. The central pulmonary vascularity is engorged but improved since yesterday's study. The trachea is midline. There is no pneumothorax.  IMPRESSION: Slight interval improvement in the appearance of the pulmonary vascularity and pulmonary interstitium which suggest minimal improvement in CHF. There is no alveolar pneumonia. Chronic pleural  thickening versus pleural fluid is stable surrounding the right lung.   Electronically Signed   By: David  [place] M.D.   On:  11/17/2014 07:48   Dg Chest Portable 1 View  12/04/2014   CLINICAL DATA:  Weakness, recent hospitalization  EXAM: PORTABLE CHEST - 1 VIEW  COMPARISON:  11/24/2014  FINDINGS: Moderate enlargement of the cardiomediastinal silhouette is noted. Lung volumes are low with marked crowding of the bronchovascular markings but no focal opacity allowing for portable technique. Trace if any pleural fluid. Cardiac leads obscure detail.  IMPRESSION: Low volume exam without focal acute finding. If symptoms persist, consider PA and lateral chest radiographs obtained at full inspiration when the patient is clinically able.   Electronically Signed   By: Christiana Pellant M.D.   On: 12/04/2014 14:27   Dg Chest Portable 1 View  11/24/2014   CLINICAL DATA:  Respiratory failure  EXAM: PORTABLE CHEST - 1 VIEW  COMPARISON:  Portable chest x-ray of November 22, 2014  FINDINGS: The lungs are reasonably well inflated. The interstitial markings are coarse and remain increased bilaterally. The left lower lobe is dense and there is obscuration of the left hemidiaphragm consistent with pleural fluid. The cardiac silhouette is enlarged. The pulmonary vascularity is prominent centrally. There is pleural fluid versus pleural thickening over the superior lateral aspects of both lungs. The bony thorax exhibits no acute abnormality.  IMPRESSION: There has not been significant change since yesterday's study. CHF with small bilateral pleural effusions and interstitial edema and/or bilateral interstitial pneumonia persists.   Electronically Signed   By: David  [place] M.D.   On: 11/24/2014 07:42   Dg Chest Port 1 View  11/22/2014   CLINICAL DATA:  Edema and shortness of breath; acute on chronic respiratory failure.  EXAM: PORTABLE CHEST - 1 VIEW  COMPARISON:  Portable chest x-ray of July 9th 2016  FINDINGS: The lungs are better inflated today. There remain coarse interstitial opacities bilaterally. The left hemidiaphragm remains obscured. Small  bilateral pleural effusions persist. The cardiac silhouette pulmonary vascularity are engorged. The mediastinum is less prominent in width today. The trachea is midline. The bony thorax exhibits no acute abnormality.  IMPRESSION: There has been mild interval improvement in the appearance of the pulmonary interstitium which may indicate improving edema or pneumonia. There remain small bilateral pleural effusions.   Electronically Signed   By: David  [place] M.D.   On: 11/22/2014 07:44   Dg Chest Port 1 View  11/19/2014   CLINICAL DATA:  Lung disease  EXAM: PORTABLE CHEST - 1 VIEW  COMPARISON:  11/17/2014  FINDINGS: Lungs are very under aerated. Heterogeneous opacities throughout both lungs have increased. The heart is enlarged. No pneumothorax.  IMPRESSION: Worsening patchy bilateral airspace disease.   Electronically Signed   By: Jolaine Click M.D.   On: 11/19/2014 09:35   Dg Chest Port 1 View  11/16/2014   CLINICAL DATA:  Dyspnea.  Interstitial lung disease.  EXAM: PORTABLE CHEST - 1 VIEW  COMPARISON:  Single view of the chest 10/13/2014 and 10/15/2014. CT chest 08/03/2014.  FINDINGS: Heart size is upper normal. Chronic interstitial coarsening does not appear notably changed. There is no evidence of pulmonary edema. No consolidative process pneumothorax is identified.  IMPRESSION: No acute abnormality. Appearance of the chest most consistent with chronic interstitial lung disease.   Electronically Signed   By: Drusilla Kanner M.D.   On: 11/16/2014 17:52    ECG  04-Dec-2014 13:50:05 [facility]/ED ROUTINE RECORD Atrial fibrillation  with rapid V-rate Ventricular premature complex Repolarization abnormality, prob rate related atrial fibrillation with rvr Confirmed by Manus Gunning MD, STEPHEN [phone]) on 12/04/2014 1:59:20 PM Personally reviewed.  Two-dimensional echocardiogram on 11/17/14: - Left ventricle: The cavity size was normal. Wall thickness was increased in a pattern of moderate  LVH. Systolic function was normal. The estimated ejection fraction was in the range of 55% to 60%. - Left atrium: The atrium was mildly dilated. - Right ventricle: RV appears dilated and hypertrophied On parasternal images ? mass in RV but I think this is just a tangential cut of thickened RV. Can consider f/u MRI or TEE if clinically indicated The cavity size was moderately dilated. - Right atrium: The atrium was mildly dilated. - Pulmonary arteries: PA peak pressure: 55 mm Hg (S).  ASSESSMENT AND PLAN   1.  Chronic atrial fibrillation.  Strategy is long-term warfarin anticoagulation and rate control. CHADSVASC score 5.  Heart rate has been faster here in the hospital secondary to sepsis.  Her dose of diltiazem has been increased today to 360 mg and she is also on low dose beta blocker and her heart rates are starting to come down.  She is not a candidate for amiodarone because of her history of interstitial lung disease. 2.  Chronic diastolic heart failure.  She appears to be stable.  Chronic high dose steroids contributing to her tendency for fluid retention.  Continue diuretics.  Renal function is stable. 3.  Leukocytosis secondary to sepsis and chronic high-dose steroids. 4.  Multiple ecchymosis on skin secondary to anticoagulation and long-term steroids therapy.  She is also on baby aspirin.  No clear indication for aspirin since she is on warfarin.  No history of ischemic heart disease.  Will stop aspirin. Will follow with you.  Karie Schwalbe MD  12/09/2014, 10:24 AM

## 2014-12-10 DIAGNOSIS — R531 Weakness: Secondary | ICD-10-CM | POA: Insufficient documentation

## 2014-12-10 DIAGNOSIS — IMO0001 Reserved for inherently not codable concepts without codable children: Secondary | ICD-10-CM | POA: Insufficient documentation

## 2014-12-10 DIAGNOSIS — I4891 Unspecified atrial fibrillation: Secondary | ICD-10-CM

## 2014-12-10 LAB — BASIC METABOLIC PANEL
Anion gap: 11 (ref 5–15)
BUN: 49 mg/dL — AB (ref 6–20)
CO2: 36 mmol/L — AB (ref 22–32)
CREATININE: 1.27 mg/dL — AB (ref 0.44–1.00)
Calcium: 8.8 mg/dL — ABNORMAL LOW (ref 8.9–10.3)
Chloride: 90 mmol/L — ABNORMAL LOW (ref 101–111)
GFR calc Af Amer: 45 mL/min — ABNORMAL LOW (ref >60)
GFR calc non Af Amer: 38 mL/min — ABNORMAL LOW (ref >60)
GLUCOSE: 140 mg/dL — AB (ref 65–99)
Potassium: 4.1 mmol/L (ref 3.5–5.1)
Sodium: 137 mmol/L (ref 135–145)

## 2014-12-10 LAB — PROTIME-INR
INR: 2.42 — AB (ref 0.00–1.49)
Prothrombin Time: 26 seconds — ABNORMAL HIGH (ref 11.6–15.2)

## 2014-12-10 LAB — MAGNESIUM: Magnesium: 2.1 mg/dL (ref 1.7–2.4)

## 2014-12-10 MED ORDER — FUROSEMIDE 40 MG PO TABS
60.0000 mg | ORAL_TABLET | Freq: Two times a day (BID) | ORAL | Status: DC
  Administered 2014-12-10 – 2014-12-13 (×6): 60 mg via ORAL
  Filled 2014-12-10 (×13): qty 1

## 2014-12-10 MED ORDER — WARFARIN 1.25 MG HALF TABLET
1.2500 mg | ORAL_TABLET | Freq: Once | ORAL | Status: AC
  Administered 2014-12-10: 1.25 mg via ORAL
  Filled 2014-12-10: qty 1

## 2014-12-10 NOTE — Progress Notes (Signed)
[facility] PROGRESS NOTE  [name] HOLTMEYER ZOX:[id number] DOB: Sep 12, 1931 DOA: 12/04/2014 PCP: Kerby Nora, MD  Assessment/Plan: 1. Sepsis -Present on admission, evidenced by a white count of 20,000 thousand 500, respiratory 27, heart rate 150s, lactic acid of 4.15. -Source of infection could be urinary tract infection as u/a showed presence of pyuria and bacteria. -Patient showing clinical improvement with a downward trend in her white count to 17,400 from 22,000 on admission -She is currently hemodynamically stable and afebrile; will monitor clinical response -Plan to follow-up on blood cultures, continue antibiotic therapy now adjusted base on sensitivity  2.  Atrial fibrillation with rapid ventricular response. -Patient presented with A. fib with RVR having ventricular rates in the 140s to 150s. -She was started on a Cardizem drip currently having heart rates in the 90-110's -I suspect underlying infectious process and advanced lung disease precipitating A. fib with RVR -Heart rates better controlled now, but still elevated. Will adjust cardizem to  daily and add low dose of metoprolol. Cardiology consulted, will follow further rec's -CHADVASC score 5  3.  End-stage interstitial lung disease. -Patient having acute on chronic hypoxemic respiratory failure on admission with an increase in her oxygen requirements having a respiratory rate of 27. -Suspect patient have an acute flareup of interstitial lung disease -Chest x-ray repeated on 12/05/2014 remained stable. -Will continue tapering steroids slowly -velcro signs appreciated on exam; no wheezing and patient without SOB  4.  Chronic anticoagulation. -Pharmacy was consulted for Coumadin management -INR adjusted by pharmacy and therapeutic currently  5.  Chronic diastolic congestive heart failure -Transthoracic echocardiogram performed on 11/17/2014 showed preserved ejection fraction. Diuretics were held on admission due to  sepsis and soft BP. -Creatinine trended up from 1.43 on 12/05/2014 to 1.62 on 12/06/2014 and down again to 1.43 on 7/28 -will follow BMET in am -Will continue Lasix at 60 mg by mouth twice a day, and continue monitoring her volume status and follow daily BMP's  6.  Proteus mirabilis and klebsiella Pneumoniae Urinary tract infection -Suspect may be cause of sepsis -following sensitivity will continue narrowed antibiotics; patient on rocephin now -no fever and no dysuria   7.  Hypokalemia. -potassium has been repleted and patient now started on daily maintenance potassium -will follow electrolytes trend   8. Ginette Pitman  -will continue patient on nystatin  -improving/resolving  Code Status: Full code Family Communication: I spoke with her son last night Disposition Plan: Plan to transfer to telemetry once HR better controlled.  Consultation:  Cardiology   Antibiotics:  Vancomycin 7/24>>7/27  Elita Quick 7/24>>7/27  Rocephin 7/27>>8/3  HPI/Subjective: Patient overall feeling better, denies CP and is no complaining of SOB. She is afebrile. Still with elevated HR (120's-130's at rest).  Objective: Filed Vitals:   12/10/14 1224  BP: 92/53  Pulse: 119  Temp: 97.8 F (36.6 C)  Resp: 28    Intake/Output Summary (Last 24 hours) at 12/10/14 1317 Last data filed at 12/10/14 1222  Gross per 24 hour  Intake      6 ml  Output   1225 ml  Net  -1219 ml   Filed Weights   12/07/14 0351 12/08/14 0330 12/10/14 0328  Weight: 138.846 kg (306 lb 1.6 oz) 138.529 kg (305 lb 6.4 oz) 126.6 kg (279 lb 1.6 oz)    Exam:   General:  Overall feeling much better today, she is awake and alert, oriented X 3, in no acute distress, chronically ill-appearing and with improvement in her thrush. HR still up (especially  with minimal exertion)  Cardiovascular: Irregular rate and rhythm, tachycardic, having bilateral 1-2+ lower extremity pitting edema (unchanged from baseline according to  patient)  Respiratory: mild positive fine crackles bilaterally, few rhonchi, on supplemental oxygen, does not appear to be in respiratory distress  Abdomen: Obese, soft nontender nondistended  Musculoskeletal: 1-2+ bilateral extremity pitting edema  Data Reviewed: Basic Metabolic Panel:  Recent Labs Lab 12/05/14 0245 12/06/14 0233 12/07/14 0355 12/07/14 1209 12/08/14 0228 12/10/14 0243  NA 140 134* 137  --  137 137  K 4.0 2.7* 2.7* 3.5 3.4* 4.1  CL 91* 82* 89*  --  87* 90*  CO2 36* 37* 37*  --  39* 36*  GLUCOSE 182* 253* 164*  --  152* 140*  BUN 74* 75* 69*  --  63* 49*  CREATININE 1.43* 1.62* 1.40*  --  1.43* 1.27*  CALCIUM 9.3 9.0 9.1  --  9.0 8.8*  MG  --   --  2.1  --   --  2.1   Liver Function Tests:  Recent Labs Lab 12/04/14 1424 12/04/14 1839  AST 44* 43*  ALT 69* 61*  ALKPHOS 63 58  BILITOT 1.0 1.4*  PROT 6.8 6.1*  ALBUMIN 3.7 3.3*   CBC:  Recent Labs Lab 12/04/14 1839 12/05/14 0245 12/06/14 0233 12/07/14 0220 12/08/14 0228  WBC 22.1* 17.4* 20.2* 21.3* 20.6*  NEUTROABS 21.0*  --   --   --   --   HGB 15.7* 14.6 14.5 14.5 14.8  HCT 48.2* 45.7 44.2 43.5 45.1  MCV 101.3* 101.3* 99.8 98.6 100.2*  PLT 180 178 162 161 141*   BNP (last 3 results)  Recent Labs  10/14/14 0420 11/16/14 1851 12/04/14 1423  BNP 301.7* 160.9* 280.1*    ProBNP (last 3 results)  Recent Labs  02/22/14 0947 02/24/14 0332 03/07/14 0648  PROBNP 2319.0* 2410.0* 437.5    CBG: No results for input(s): GLUCAP in the last 168 hours.  Recent Results (from the past 240 hour(s))  Blood culture (routine x 2)     Status: None   Collection Time: 12/04/14  2:09 PM  Result Value Ref Range Status   Specimen Description BLOOD LEFT HAND  Final   Special Requests BOTTLES DRAWN AEROBIC AND ANAEROBIC 5CC  Final   Culture NO GROWTH 5 DAYS  Final   Report Status 12/09/2014 FINAL  Final  Urine C&S     Status: None   Collection Time: 12/04/14  2:17 PM  Result Value Ref Range  Status   Specimen Description URINE, CLEAN CATCH  Final   Special Requests NONE  Final   Culture   Final    >=100,000 COLONIES/mL PROTEUS MIRABILIS 30,000 COLONIES/mL KLEBSIELLA PNEUMONIAE    Report Status 12/08/2014 FINAL  Final   Organism ID, Bacteria PROTEUS MIRABILIS  Final   Organism ID, Bacteria KLEBSIELLA PNEUMONIAE  Final      Susceptibility   Klebsiella pneumoniae - MIC*    AMPICILLIN >=32 RESISTANT Resistant     CEFAZOLIN <=4 SENSITIVE Sensitive     CEFTRIAXONE <=1 SENSITIVE Sensitive     CIPROFLOXACIN <=0.25 SENSITIVE Sensitive     GENTAMICIN <=1 SENSITIVE Sensitive     IMIPENEM <=0.25 SENSITIVE Sensitive     NITROFURANTOIN 64 INTERMEDIATE Intermediate     TRIMETH/SULFA <=20 SENSITIVE Sensitive     AMPICILLIN/SULBACTAM 8 SENSITIVE Sensitive     PIP/TAZO <=4 SENSITIVE Sensitive     * 30,000 COLONIES/mL KLEBSIELLA PNEUMONIAE   Proteus mirabilis - MIC*    AMPICILLIN <=  2 SENSITIVE Sensitive     CEFAZOLIN 8 SENSITIVE Sensitive     CEFTRIAXONE <=1 SENSITIVE Sensitive     CIPROFLOXACIN <=0.25 SENSITIVE Sensitive     GENTAMICIN <=1 SENSITIVE Sensitive     IMIPENEM 4 SENSITIVE Sensitive     NITROFURANTOIN 256 RESISTANT Resistant     TRIMETH/SULFA <=20 SENSITIVE Sensitive     AMPICILLIN/SULBACTAM <=2 SENSITIVE Sensitive     PIP/TAZO <=4 SENSITIVE Sensitive     * >=100,000 COLONIES/mL PROTEUS MIRABILIS  Blood culture (routine x 2)     Status: None   Collection Time: 12/04/14  3:02 PM  Result Value Ref Range Status   Specimen Description BLOOD RIGHT ANTECUBITAL  Final   Special Requests BOTTLES DRAWN AEROBIC AND ANAEROBIC 5CC  Final   Culture NO GROWTH 5 DAYS  Final   Report Status 12/09/2014 FINAL  Final  MRSA PCR Screening     Status: Abnormal   Collection Time: 12/04/14  4:45 PM  Result Value Ref Range Status   MRSA by PCR POSITIVE (A) NEGATIVE Final    Comment:        The GeneXpert MRSA Assay (FDA approved for NASAL specimens only), is one component of  a comprehensive MRSA colonization surveillance program. It is not intended to diagnose MRSA infection nor to guide or monitor treatment for MRSA infections. CRITICAL RESULT CALLED TO, READ BACK BY AND VERIFIED WITH: M.NORMENT,RN 12/04/14 @2010  BY V.WILKINS      Studies: No results found.  Scheduled Meds: . allopurinol  300 mg Oral Daily  . atorvastatin  10 mg Oral Daily  . budesonide  0.25 mg Nebulization BID  . cefTRIAXone (ROCEPHIN)  IV  1 g Intravenous Q24H  . diltiazem  360 mg Oral Daily  . escitalopram  20 mg Oral Daily  . furosemide  40 mg Oral BID  . gabapentin  300 mg Oral Daily  . metoprolol tartrate  12.5 mg Oral BID  . nystatin  5 mL Oral QID  . potassium chloride  40 mEq Oral Daily  . predniSONE  30 mg Oral Q breakfast  . sodium chloride  3 mL Intravenous Q12H  . sodium chloride  3 mL Intravenous Q12H  . warfarin  1.25 mg Oral ONCE-1800  . Warfarin - Pharmacist Dosing Inpatient   Does not apply q1800   Continuous Infusions:    Principal Problem:   Sepsis Active Problems:   CKD (chronic kidney disease) stage 3, GFR 30-59 ml/min   Interstitial lung disease   Debilitated   UTI (lower urinary tract infection)   Atrial fibrillation with RVR   FTT (failure to thrive) in adult    Time spent: 40 min (> 50% of the time dedicated to face to face examination, coordination of care and discussion with other specialist)    Vassie Loll  [facility] Pager [phone]. If 7PM-7AM, please contact night-coverage at [email or web address], password [facility] 12/10/2014, 1:17 PM  LOS: 6 days

## 2014-12-10 NOTE — Progress Notes (Signed)
ANTICOAGULATION CONSULT NOTE - Follow Up Consult  Pharmacy Consult for Coumadin Indication: atrial fibrillation and hx DVT  Allergies  Allergen Reactions  . Pirfenidone Other (See Comments)    Congestion, breathing diffulty    Patient Measurements: Height:  (162.6 cm) Weight: 279 lb 1.6 oz (126.6 kg) IBW/kg (Calculated) : 54.7  Vital Signs: Temp: 97.8 F (36.6 C) (07/30 1224) Temp Source: Oral (07/30 1224) BP: 92/53 mmHg (07/30 1224) Pulse Rate: 119 (07/30 1224)  Labs:  Recent Labs  12/08/14 0228 12/09/14 0224 12/10/14 0243  HGB 14.8  --   --   HCT 45.1  --   --   PLT 141*  --   --   LABPROT 24.6* 27.4* 26.0*  INR 2.24* 2.59* 2.42*  CREATININE 1.43*  --  1.27*    Estimated Creatinine Clearance: 45.8 mL/min (by C-G formula based on Cr of 1.27).  Assessment: 83yof admitted 12/04/2014 for AFib with RVR, encephalopathy, & meeting sepsis criteria on warfarin for hx Afib & DVT.   PMH end-stage pulmonary fibrosis, chronic diastolic congestive heart failure, chronic hypoxemic respiratory failure, atrial fibrillation   Pharmacy consulted to dose warfarin.  AC/Heme: Afib, hx LLE DVT 2008 & 2010 INR 2.2 > 2.5 > 2.42 therapeutic, INR therapeutic at 2.13 on admission H/H stable, plt 141 Fluconazole 7/25- 7/27 (received 3 doses), half-life ~ 46 hrs in elderly & may still elevate INR  Received lowered dose (1.25 mg) last night d/t 0.3 INR rise Given long half-life of fluconazole and pt renal function, will give additional lowered warfarin dose tonight and consider resuming PTA dose tomorrow, pending INR PTA warfarin dose 2.5mg  qday  Goal of Therapy:  INR 2-3 Monitor platelets by anticoagulation protocol: Yes   Plan:  1) Coumadin 1.25mg  x 1 2) INR in AM  Floria Raveling, [place].D. 12/10/2014,12:56 PM  Patient seen and evaluated with Raymondo Band, Pharm.D.  Agree with above assessment and plan.  Thank you for allowing pharmacy to be a part of this patients care  team.  Lovenia Kim Pharm.D., BCPS, AQ-Cardiology Clinical Pharmacist 12/10/2014 1:54 PM Pager: [phone] Phone: [phone]

## 2014-12-10 NOTE — Progress Notes (Signed)
Pt is now on night BiPAP mask 14/7 5l. Pt is tolerating well. RN aware that pt is on BiPAP.

## 2014-12-10 NOTE — Progress Notes (Signed)
Patient ID: [name] [name], female   DOB: [birth year], 81 y.o.   MRN: [id number]    Subjective:  Denies SSCP, palpitations or Dyspnea Weak sitting in chair reading   Objective:  Filed Vitals:   12/10/14 0400 12/10/14 0740 12/10/14 0800 12/10/14 0929  BP: 115/74  106/64 100/69  Pulse: 101  123 121  Temp:   98 F (36.7 C)   TempSrc:   Oral   Resp: 19  24   Height:      Weight:      SpO2: 96% 95% 94%     Intake/Output from previous day:  Intake/Output Summary (Last 24 hours) at 12/10/14 0955 Last data filed at 12/10/14 4098  Gross per 24 hour  Intake    246 ml  Output   1175 ml  Net   -929 ml    Physical Exam: Affect appropriate Chronically ill obese white female  Cushingoid  HEENT: normal Neck supple with no adenopathy JVP normal no bruits no thyromegaly Lungs clear with no wheezing and good diaphragmatic motion Heart:  S1/S2 no murmur, no rub, gallop or click PMI normal Abdomen: benighn, BS positve, no tenderness, no AAA no bruit.  No HSM or HJR Distal pulses intact with no bruits Plus one bilateral  edema Neuro non-focal Multiple echymosis  No muscular weakness   Lab Results: Basic Metabolic Panel:  Recent Labs  11/91/47 0228 12/10/14 0243  NA 137 137  K 3.4* 4.1  CL 87* 90*  CO2 39* 36*  GLUCOSE 152* 140*  BUN 63* 49*  CREATININE 1.43* 1.27*  CALCIUM 9.0 8.8*  MG  --  2.1   CBC:  Recent Labs  12/08/14 0228  WBC 20.6*  HGB 14.8  HCT 45.1  MCV 100.2*  PLT 141*    Imaging: No results found.  Cardiac Studies:  ECG:  afib nonspecific ST/T wave changes    Telemetry:  afib rates 100-120    Echo: Study Conclusions  11/17/14 reviewed   - Left ventricle: The cavity size was normal. Wall thickness was increased in a pattern of moderate LVH. Systolic function was normal. The estimated ejection fraction was in the range of 55% to 60%. - Left atrium: The atrium was mildly dilated. - Right ventricle: RV appears dilated and hypertrophied  On parasternal images ? mass in RV but I think this is just a tangential cut of thickened RV. Can consider f/u MRI or TEE if clinically indicated The cavity size was moderately dilated. - Right atrium: The atrium was mildly dilated. - Pulmonary arteries: PA peak pressure: 55 mm Hg (S).  Medications:   . allopurinol  300 mg Oral Daily  . atorvastatin  10 mg Oral Daily  . budesonide  0.25 mg Nebulization BID  . cefTRIAXone (ROCEPHIN)  IV  1 g Intravenous Q24H  . diltiazem  360 mg Oral Daily  . escitalopram  20 mg Oral Daily  . furosemide  40 mg Oral BID  . gabapentin  300 mg Oral Daily  . metoprolol tartrate  12.5 mg Oral BID  . nystatin  5 mL Oral QID  . potassium chloride  40 mEq Oral Daily  . predniSONE  30 mg Oral Q breakfast  . sodium chloride  3 mL Intravenous Q12H  . sodium chloride  3 mL Intravenous Q12H  . Warfarin - Pharmacist Dosing Inpatient   Does not apply q1800       Assessment/Plan:  Afib:  Failed DCC no amiodarone due to interstitial lung diseae.  Rx INR on coumadin.  Continue cardizem and beta blocker for reate control Diastolic CHF:   Stable continue bid lasix   Chol:  On statin     Charlton Haws 12/10/2014, 9:55 AM

## 2014-12-11 LAB — PROTIME-INR
INR: 1.98 — AB (ref 0.00–1.49)
PROTHROMBIN TIME: 22.4 s — AB (ref 11.6–15.2)

## 2014-12-11 LAB — BASIC METABOLIC PANEL
ANION GAP: 9 (ref 5–15)
BUN: 45 mg/dL — ABNORMAL HIGH (ref 6–20)
CHLORIDE: 92 mmol/L — AB (ref 101–111)
CO2: 36 mmol/L — ABNORMAL HIGH (ref 22–32)
CREATININE: 1.24 mg/dL — AB (ref 0.44–1.00)
Calcium: 8.9 mg/dL (ref 8.9–10.3)
GFR calc Af Amer: 46 mL/min — ABNORMAL LOW (ref >60)
GFR calc non Af Amer: 40 mL/min — ABNORMAL LOW (ref >60)
Glucose, Bld: 131 mg/dL — ABNORMAL HIGH (ref 65–99)
POTASSIUM: 4.4 mmol/L (ref 3.5–5.1)
Sodium: 137 mmol/L (ref 135–145)

## 2014-12-11 LAB — CBC
HCT: 41.9 % (ref 36.0–46.0)
Hemoglobin: 13.5 g/dL (ref 12.0–15.0)
MCH: 32.8 pg (ref 26.0–34.0)
MCHC: 32.2 g/dL (ref 30.0–36.0)
MCV: 101.9 fL — ABNORMAL HIGH (ref 78.0–100.0)
Platelets: 90 10*3/uL — ABNORMAL LOW (ref 150–400)
RBC: 4.11 MIL/uL (ref 3.87–5.11)
RDW: 16 % — AB (ref 11.5–15.5)
WBC: 15.3 10*3/uL — ABNORMAL HIGH (ref 4.0–10.5)

## 2014-12-11 MED ORDER — WARFARIN SODIUM 2.5 MG PO TABS
2.5000 mg | ORAL_TABLET | Freq: Once | ORAL | Status: AC
  Administered 2014-12-11: 2.5 mg via ORAL
  Filled 2014-12-11 (×2): qty 1

## 2014-12-11 MED ORDER — MAGIC MOUTHWASH W/LIDOCAINE
5.0000 mL | Freq: Three times a day (TID) | ORAL | Status: DC | PRN
  Administered 2014-12-11 – 2014-12-12 (×3): 5 mL via ORAL
  Filled 2014-12-11 (×5): qty 5

## 2014-12-11 MED ORDER — CEFUROXIME AXETIL 500 MG PO TABS
500.0000 mg | ORAL_TABLET | Freq: Two times a day (BID) | ORAL | Status: AC
  Administered 2014-12-11 – 2014-12-16 (×10): 500 mg via ORAL
  Filled 2014-12-11 (×13): qty 1

## 2014-12-11 NOTE — Progress Notes (Signed)
[facility] PROGRESS NOTE  [name] [name] ZOX:[id number] DOB: Dec 10, 1931 DOA: 12/04/2014 PCP: Kerby Nora, MD  Assessment/Plan: 1. Sepsis due to UTI -Present on admission, evidenced by a white count of 20,000 thousand 500, respiratory 27, heart rate 150s, lactic acid of 4.15. -Source of infection could be urinary tract infection as u/a showed presence of pyuria and bacteria. -Patient showing clinical improvement with a downward trend in her white count to 15,000 from 22,000 on admission -She is currently hemodynamically stable and afebrile; will monitor clinical response -Continue antibiotic therapy now adjusted base on sensitivity and transition to Oral regimen  2.  Atrial fibrillation with rapid ventricular response. -Patient presented with A. fib with RVR having ventricular rates in the 140s to 150s. -She was started on a Cardizem drip currently having heart rates in the 90-110's -I suspect underlying infectious process and advanced lung disease precipitating A. fib with RVR -Heart rates better controlled now, but still elevated. Will continue cardizem to 360mg  daily and TID metoprolol (dose adjusted on 7/31); EP will be also consulted by cardiology. Will follow further rec's -CHADVASC score 5  3.  End-stage interstitial lung disease. -Patient having acute on chronic hypoxemic respiratory failure on admission with an increase in her oxygen requirements having a respiratory rate of 27. -Suspect patient have an acute flareup of interstitial lung disease -Chest x-ray repeated on 12/05/2014 remained stable. -Will continue tapering steroids slowly -velcro signs appreciated on exam; no wheezing and patient without SOB  4.  Chronic anticoagulation. -Pharmacy was consulted for Coumadin management -INR adjusted by pharmacy and therapeutic currently  5.  Chronic diastolic congestive heart failure -Transthoracic echocardiogram performed on 11/17/2014 showed preserved ejection fraction.  Diuretics were held on admission due to sepsis and soft BP. -Creatinine trended up from 1.43 on 12/05/2014 to 1.62 on 12/06/2014 and down again to 1.24 on 7/31 -will follow BMET in am -Will continue Lasix at 60 mg by mouth twice a day, and continue monitoring her volume status and follow daily BMP's  6.  Proteus mirabilis and klebsiella Pneumoniae Urinary tract infection -Suspect may be cause of sepsis -following sensitivity will continue narrowed antibiotics; patient on ceftin now -no fever and no dysuria   7.  Hypokalemia. -potassium has been repleted and patient now started on daily maintenance potassium -will follow electrolytes trend   8. Ginette Pitman  -will continue patient on nystatin  -improving/resolving  Code Status: Full code Family Communication: I spoke with her son last night Disposition Plan: Plan to transfer to telemetry once HR better controlled.  Consultation:  Cardiology   Antibiotics:  Vancomycin 7/24>>7/27  Elita Quick 7/24>>7/27  Rocephin 7/27>>7/32  Ceftin 7/31>>8/3  HPI/Subjective: Patient overall feeling better, denies CP and is no complaining of SOB. She is afebrile. Can not feel palpitations for HR is still up.  Objective: Filed Vitals:   12/11/14 0800  BP: 118/53  Pulse:   Temp: 97.6 F (36.4 C)  Resp:     Intake/Output Summary (Last 24 hours) at 12/11/14 0905 Last data filed at 12/11/14 0500  Gross per 24 hour  Intake   1343 ml  Output   1700 ml  Net   -357 ml   Filed Weights   12/08/14 0330 12/10/14 0328 12/11/14 0401  Weight: 138.529 kg (305 lb 6.4 oz) 126.6 kg (279 lb 1.6 oz) 127.7 kg (281 lb 8.4 oz)    Exam:   General:  Overall feeling much better today, she is awake and alert, oriented X 3, in no acute distress and  afebrile. Denies dysuria and abd pain. She can not feel palpitations; but HR still up  Cardiovascular: Irregular rate and rhythm, tachycardic, having bilateral 1-2+ lower extremity pitting edema (unchanged from  baseline according to patient)  Respiratory: mild positive fine crackles bilaterally, few rhonchi, on supplemental oxygen, does not appear to be in respiratory distress  Abdomen: Obese, soft nontender nondistended  Musculoskeletal: 1-2+ bilateral extremity pitting edema  Data Reviewed: Basic Metabolic Panel:  Recent Labs Lab 12/06/14 0233 12/07/14 0355 12/07/14 1209 12/08/14 0228 12/10/14 0243 12/11/14 0239  NA 134* 137  --  137 137 137  K 2.7* 2.7* 3.5 3.4* 4.1 4.4  CL 82* 89*  --  87* 90* 92*  CO2 37* 37*  --  39* 36* 36*  GLUCOSE 253* 164*  --  152* 140* 131*  BUN 75* 69*  --  63* 49* 45*  CREATININE 1.62* 1.40*  --  1.43* 1.27* 1.24*  CALCIUM 9.0 9.1  --  9.0 8.8* 8.9  MG  --  2.1  --   --  2.1  --    Liver Function Tests:  Recent Labs Lab 12/04/14 1424 12/04/14 1839  AST 44* 43*  ALT 69* 61*  ALKPHOS 63 58  BILITOT 1.0 1.4*  PROT 6.8 6.1*  ALBUMIN 3.7 3.3*   CBC:  Recent Labs Lab 12/04/14 1839 12/05/14 0245 12/06/14 0233 12/07/14 0220 12/08/14 0228 12/11/14 0239  WBC 22.1* 17.4* 20.2* 21.3* 20.6* 15.3*  NEUTROABS 21.0*  --   --   --   --   --   HGB 15.7* 14.6 14.5 14.5 14.8 13.5  HCT 48.2* 45.7 44.2 43.5 45.1 41.9  MCV 101.3* 101.3* 99.8 98.6 100.2* 101.9*  PLT 180 178 162 161 141* 90*   BNP (last 3 results)  Recent Labs  10/14/14 0420 11/16/14 1851 12/04/14 1423  BNP 301.7* 160.9* 280.1*    ProBNP (last 3 results)  Recent Labs  02/22/14 0947 02/24/14 0332 03/07/14 0648  PROBNP 2319.0* 2410.0* 437.5    CBG: No results for input(s): GLUCAP in the last 168 hours.  Recent Results (from the past 240 hour(s))  Blood culture (routine x 2)     Status: None   Collection Time: 12/04/14  2:09 PM  Result Value Ref Range Status   Specimen Description BLOOD LEFT HAND  Final   Special Requests BOTTLES DRAWN AEROBIC AND ANAEROBIC 5CC  Final   Culture NO GROWTH 5 DAYS  Final   Report Status 12/09/2014 FINAL  Final  Urine C&S     Status:  None   Collection Time: 12/04/14  2:17 PM  Result Value Ref Range Status   Specimen Description URINE, CLEAN CATCH  Final   Special Requests NONE  Final   Culture   Final    >=100,000 COLONIES/mL PROTEUS MIRABILIS 30,000 COLONIES/mL KLEBSIELLA PNEUMONIAE    Report Status 12/08/2014 FINAL  Final   Organism ID, Bacteria PROTEUS MIRABILIS  Final   Organism ID, Bacteria KLEBSIELLA PNEUMONIAE  Final      Susceptibility   Klebsiella pneumoniae - MIC*    AMPICILLIN >=32 RESISTANT Resistant     CEFAZOLIN <=4 SENSITIVE Sensitive     CEFTRIAXONE <=1 SENSITIVE Sensitive     CIPROFLOXACIN <=0.25 SENSITIVE Sensitive     GENTAMICIN <=1 SENSITIVE Sensitive     IMIPENEM <=0.25 SENSITIVE Sensitive     NITROFURANTOIN 64 INTERMEDIATE Intermediate     TRIMETH/SULFA <=20 SENSITIVE Sensitive     AMPICILLIN/SULBACTAM 8 SENSITIVE Sensitive     PIP/TAZO <=  4 SENSITIVE Sensitive     * 30,000 COLONIES/mL KLEBSIELLA PNEUMONIAE   Proteus mirabilis - MIC*    AMPICILLIN <=2 SENSITIVE Sensitive     CEFAZOLIN 8 SENSITIVE Sensitive     CEFTRIAXONE <=1 SENSITIVE Sensitive     CIPROFLOXACIN <=0.25 SENSITIVE Sensitive     GENTAMICIN <=1 SENSITIVE Sensitive     IMIPENEM 4 SENSITIVE Sensitive     NITROFURANTOIN 256 RESISTANT Resistant     TRIMETH/SULFA <=20 SENSITIVE Sensitive     AMPICILLIN/SULBACTAM <=2 SENSITIVE Sensitive     PIP/TAZO <=4 SENSITIVE Sensitive     * >=100,000 COLONIES/mL PROTEUS MIRABILIS  Blood culture (routine x 2)     Status: None   Collection Time: 12/04/14  3:02 PM  Result Value Ref Range Status   Specimen Description BLOOD RIGHT ANTECUBITAL  Final   Special Requests BOTTLES DRAWN AEROBIC AND ANAEROBIC 5CC  Final   Culture NO GROWTH 5 DAYS  Final   Report Status 12/09/2014 FINAL  Final  MRSA PCR Screening     Status: Abnormal   Collection Time: 12/04/14  4:45 PM  Result Value Ref Range Status   MRSA by PCR POSITIVE (A) NEGATIVE Final    Comment:        The GeneXpert MRSA Assay  (FDA approved for NASAL specimens only), is one component of a comprehensive MRSA colonization surveillance program. It is not intended to diagnose MRSA infection nor to guide or monitor treatment for MRSA infections. CRITICAL RESULT CALLED TO, READ BACK BY AND VERIFIED WITH: M.NORMENT,RN 12/04/14 @2010  BY V.WILKINS      Studies: No results found.  Scheduled Meds: . allopurinol  300 mg Oral Daily  . atorvastatin  10 mg Oral Daily  . budesonide  0.25 mg Nebulization BID  . cefUROXime  500 mg Oral BID WC  . diltiazem  360 mg Oral Daily  . escitalopram  20 mg Oral Daily  . furosemide  60 mg Oral BID  . gabapentin  300 mg Oral Daily  . metoprolol tartrate  12.5 mg Oral BID  . nystatin  5 mL Oral QID  . potassium chloride  40 mEq Oral Daily  . predniSONE  30 mg Oral Q breakfast  . sodium chloride  3 mL Intravenous Q12H  . sodium chloride  3 mL Intravenous Q12H  . Warfarin - Pharmacist Dosing Inpatient   Does not apply q1800   Continuous Infusions:    Principal Problem:   Sepsis Active Problems:   CKD (chronic kidney disease) stage 3, GFR 30-59 ml/min   Interstitial lung disease   Debilitated   UTI (lower urinary tract infection)   Atrial fibrillation with RVR   FTT (failure to thrive) in adult   Blood poisoning   Weakness    Time spent: 40 min (> 50% of the time dedicated to face to face examination, coordination of care and discussion with other specialist)    Vassie Loll  [facility] Pager [phone]. If 7PM-7AM, please contact night-coverage at [email or web address], password [facility] 12/11/2014, 9:05 AM  LOS: 7 days

## 2014-12-11 NOTE — Progress Notes (Signed)
Pt transferred to 5W01 per MD order. Pt VSS, NAD, all belongings sent with pt to new room.

## 2014-12-11 NOTE — Progress Notes (Addendum)
Patient trasfered from [facility] to 5W01 via wheelchair; alert and oriented x 4; no complaints of pain; IV saline locked in LH; skin intact, redness on bilateral buttocks blanchable; dressing on sacral area for protection; bruises BLE and BUE (skin was assesed by nurse Adela Ports and nurse Edgardo Roys). Orient patient to room and unit; instructed how to use the call bell and  fall risk precautions. Will continue to monitor the patient.

## 2014-12-11 NOTE — Progress Notes (Signed)
Patient ID: [name] [name], female   DOB: [birth year], 81 y.o.   MRN: [id number]    Subjective:  Does not feel palpitations rate still high  Objective:  Filed Vitals:   12/10/14 2351 12/11/14 0401 12/11/14 0735 12/11/14 0800  BP: 105/51 137/70  118/53  Pulse: 91 94    Temp:  97.1 F (36.2 C)  97.6 F (36.4 C)  TempSrc:  Axillary  Oral  Resp: 22 25    Height:      Weight:  127.7 kg (281 lb 8.4 oz)    SpO2: 92% 92% 92% 92%    Intake/Output from previous day:  Intake/Output Summary (Last 24 hours) at 12/11/14 0835 Last data filed at 12/11/14 0500  Gross per 24 hour  Intake   1343 ml  Output   1700 ml  Net   -357 ml    Physical Exam: Affect appropriate Chronically ill obese white female  Cushingoid  HEENT: normal Neck supple with no adenopathy JVP normal no bruits no thyromegaly Lungs clear with no wheezing and good diaphragmatic motion Heart:  S1/S2 no murmur, no rub, gallop or click PMI normal Abdomen: benighn, BS positve, no tenderness, no AAA no bruit.  No HSM or HJR Distal pulses intact with no bruits Plus one bilateral  edema Neuro non-focal Multiple echymosis  No muscular weakness   Lab Results: Basic Metabolic Panel:  Recent Labs  40/98/11 0243 12/11/14 0239  NA 137 137  K 4.1 4.4  CL 90* 92*  CO2 36* 36*  GLUCOSE 140* 131*  BUN 49* 45*  CREATININE 1.27* 1.24*  CALCIUM 8.8* 8.9  MG 2.1  --    CBC:  Recent Labs  12/11/14 0239  WBC 15.3*  HGB 13.5  HCT 41.9  MCV 101.9*  PLT 90*    Imaging: No results found.  Cardiac Studies:  ECG:  afib nonspecific ST/T wave changes    Telemetry:  afib rates 100-120    Echo: Study Conclusions  11/17/14 reviewed   - Left ventricle: The cavity size was normal. Wall thickness was increased in a pattern of moderate LVH. Systolic function was normal. The estimated ejection fraction was in the range of 55% to 60%. - Left atrium: The atrium was mildly dilated. - Right ventricle: RV appears  dilated and hypertrophied On parasternal images ? mass in RV but I think this is just a tangential cut of thickened RV. Can consider f/u MRI or TEE if clinically indicated The cavity size was moderately dilated. - Right atrium: The atrium was mildly dilated. - Pulmonary arteries: PA peak pressure: 55 mm Hg (S).  Medications:   . allopurinol  300 mg Oral Daily  . atorvastatin  10 mg Oral Daily  . budesonide  0.25 mg Nebulization BID  . cefTRIAXone (ROCEPHIN)  IV  1 g Intravenous Q24H  . diltiazem  360 mg Oral Daily  . escitalopram  20 mg Oral Daily  . furosemide  60 mg Oral BID  . gabapentin  300 mg Oral Daily  . metoprolol tartrate  12.5 mg Oral BID  . nystatin  5 mL Oral QID  . potassium chloride  40 mEq Oral Daily  . predniSONE  30 mg Oral Q breakfast  . sodium chloride  3 mL Intravenous Q12H  . sodium chloride  3 mL Intravenous Q12H  . Warfarin - Pharmacist Dosing Inpatient   Does not apply q1800       Assessment/Plan:  Afib:  Failed DCC no amiodarone due to interstitial  lung diseae.  Rx INR on coumadin.  Continue cardizem increase beta blocker Her GFR is 46 and sotolol is contraindicated for GFR under 40.  Also ? Multaq.  Will ask EP to see in am.  Note ECG from June Patient was in NSR  Diastolic CHF:   Stable continue bid lasix    COPD/ILD  On chronic steroids cushingoid ? Change to PO antibiotics  Chol:  On statin     Charlton Haws 12/11/2014, 8:35 AM

## 2014-12-11 NOTE — Progress Notes (Signed)
Pt stated to me that she takes an inhaler at home. I further explain to pt that her order here is different. Med not given. Maybe pt was confused by neb order im not totally sure. But pt stated she will take med in morning.

## 2014-12-11 NOTE — Progress Notes (Signed)
ANTICOAGULATION CONSULT NOTE - Follow Up Consult  Pharmacy Consult for Coumadin Indication: atrial fibrillation and hx DVT  Allergies  Allergen Reactions  . Pirfenidone Other (See Comments)    Congestion, breathing diffulty    Patient Measurements: Height:  (162.6 cm) Weight: 281 lb 8.4 oz (127.7 kg) IBW/kg (Calculated) : 54.7  Vital Signs: Temp: 97.9 F (36.6 C) (07/31 1120) Temp Source: Oral (07/31 1120) BP: 92/55 mmHg (07/31 1120) Pulse Rate: 94 (07/31 0401)  Labs:  Recent Labs  12/09/14 0224 12/10/14 0243 12/11/14 0239  HGB  --   --  13.5  HCT  --   --  41.9  PLT  --   --  90*  LABPROT 27.4* 26.0* 22.4*  INR 2.59* 2.42* 1.98*  CREATININE  --  1.27* 1.24*    Estimated Creatinine Clearance: 47.1 mL/min (by C-G formula based on Cr of 1.24).  Assessment: [name] [name] admitted 12/04/2014 for AFib with RVR, encephalopathy, & meeting sepsis criteria on warfarin for hx Afib & DVT.   PMH end-stage pulmonary fibrosis, chronic diastolic congestive heart failure, chronic hypoxemic respiratory failure, atrial fibrillation   Pharmacy consulted to dose warfarin.  AC/Heme: Afib, hx LLE DVT '08 & hx PE '10.  INR 1.98 therapeutic Fluconazole 7/25- 7/27 (received 3 doses), half-life ~ 46 hrs in elderly Received lowered dose x 2 d/t fluconazole interaction resulting in 0.3 INR rise Resuming PTA dose tonight PTA warfarin dose 2.5mg  qday  Goal of Therapy:  INR 2-3 Monitor platelets by anticoagulation protocol: Yes   Plan:  1) Coumadin 2.5 mg x 1 2) INR in AM  Floria Raveling, [place].D. 12/11/2014,2:16 PM

## 2014-12-12 ENCOUNTER — Encounter (HOSPITAL_COMMUNITY): Payer: Self-pay | Admitting: Nurse Practitioner

## 2014-12-12 DIAGNOSIS — I481 Persistent atrial fibrillation: Secondary | ICD-10-CM

## 2014-12-12 LAB — BASIC METABOLIC PANEL
Anion gap: 5 (ref 5–15)
BUN: 42 mg/dL — AB (ref 6–20)
CALCIUM: 9.2 mg/dL (ref 8.9–10.3)
CO2: 35 mmol/L — ABNORMAL HIGH (ref 22–32)
Chloride: 98 mmol/L — ABNORMAL LOW (ref 101–111)
Creatinine, Ser: 1.27 mg/dL — ABNORMAL HIGH (ref 0.44–1.00)
GFR, EST AFRICAN AMERICAN: 45 mL/min — AB (ref >60)
GFR, EST NON AFRICAN AMERICAN: 38 mL/min — AB (ref >60)
Glucose, Bld: 108 mg/dL — ABNORMAL HIGH (ref 65–99)
Potassium: 4.7 mmol/L (ref 3.5–5.1)
SODIUM: 138 mmol/L (ref 135–145)

## 2014-12-12 LAB — PROTIME-INR
INR: 1.61 — AB (ref 0.00–1.49)
Prothrombin Time: 19.1 seconds — ABNORMAL HIGH (ref 11.6–15.2)

## 2014-12-12 MED ORDER — HEPARIN (PORCINE) IN NACL 100-0.45 UNIT/ML-% IJ SOLN
1100.0000 [IU]/h | INTRAMUSCULAR | Status: DC
  Administered 2014-12-12: 1100 [IU]/h via INTRAVENOUS
  Administered 2014-12-13 (×2): 1200 [IU]/h via INTRAVENOUS
  Administered 2014-12-14: 1400 [IU]/h via INTRAVENOUS
  Administered 2014-12-15: 1100 [IU]/h via INTRAVENOUS
  Filled 2014-12-12 (×4): qty 250

## 2014-12-12 MED ORDER — WARFARIN SODIUM 3 MG PO TABS
3.0000 mg | ORAL_TABLET | Freq: Once | ORAL | Status: AC
  Administered 2014-12-12: 3 mg via ORAL
  Filled 2014-12-12: qty 1

## 2014-12-12 NOTE — Consult Note (Signed)
PHARMACY NOTE  Pharmacy Consult :  81 y.o. female is currently on Coumadin for atrial fibrillation and DVT.  Heparin bridging pending antiarrhythic therapy and possible DCCV. Indication :  Heparin bridging  Heparin Dosing Wt :  85.6 kg  LABS :  Recent Labs  12/10/14 0243 12/11/14 0239 12/12/14 0528  HGB  --  13.5  --   HCT  --  41.9  --   PLT  --  90*  --   LABPROT 26.0* 22.4* 19.1*  INR 2.42* 1.98* 1.61*  CREATININE 1.27* 1.24* 1.27*    MEDICATION: Medication PTA: Prescriptions prior to admission  Medication Sig Dispense Refill Last Dose  . albuterol (PROVENTIL) (2.5 MG/3ML) 0.083% nebulizer solution Take 3 mLs (2.5 mg total) by nebulization every 2 (two) hours as needed for wheezing or shortness of breath. 75 mL 12 12/04/2014 at Unknown time  . allopurinol (ZYLOPRIM) 300 MG tablet Take 1 tablet (300 mg total) by mouth daily. 30 tablet 11 12/04/2014 at Unknown time  . Alum & Mag Hydroxide-Simeth (MAGIC MOUTHWASH W/LIDOCAINE) SOLN Take 15 mLs by mouth 3 (three) times daily as needed for mouth pain. 15 mL 0 unk  . aspirin 81 MG tablet Take 81 mg by mouth daily.   12/04/2014 at Unknown time  . atorvastatin (LIPITOR) 10 MG tablet Take 1 tablet (10 mg total) by mouth daily. 30 tablet 5 12/04/2014 at Unknown time  . budesonide (PULMICORT) 0.25 MG/2ML nebulizer solution Take 0.25 mg by nebulization 2 (two) times daily.   12/04/2014 at Unknown time  . cetirizine (ZYRTEC ALLERGY) 10 MG tablet Take 10 mg by mouth daily.    12/04/2014 at Unknown time  . diltiazem (CARDIZEM CD) 240 MG 24 hr capsule Take 1 capsule (240 mg total) by mouth daily. 30 capsule 0 12/04/2014 at Unknown time  . escitalopram (LEXAPRO) 20 MG tablet Take 1 tablet (20 mg total) by mouth daily. 30 tablet 5 12/04/2014 at Unknown time  . famotidine (PEPCID) 20 MG tablet Take 1 tablet (20 mg total) by mouth daily. 30 tablet 6 12/04/2014 at Unknown time  . ferrous sulfate 325 (65 FE) MG tablet Take 1  tablet (325 mg total) by mouth 2 (two) times daily with a meal. 60 tablet 0 12/04/2014 at Unknown time  . furosemide (LASIX) 40 MG tablet Take 1 tablet (40 mg total) by mouth 2 (two) times daily. 30 tablet 0 12/04/2014 at Unknown time  . gabapentin (NEURONTIN) 300 MG capsule Take 1 capsule (300 mg total) by mouth daily. 90 capsule 0 12/04/2014 at Unknown time  . metolazone (ZAROXOLYN) 5 MG tablet Take 1 tablet (5 mg total) by mouth daily. 30 tablet 1 12/04/2014 at Unknown time  . Multiple Vitamins-Minerals (MULTIVITAMIN & MINERAL PO) Take 1 tablet by mouth daily.   12/04/2014 at Unknown time  . potassium chloride SA (K-DUR,KLOR-CON) 20 MEQ tablet Take 2 tablets (40 mEq total) by mouth 2 (two) times daily.   12/04/2014 at Unknown time  . predniSONE (DELTASONE) 5 MG tablet 8 tablets daily 1 week then 3 tablets daily 1 month then 2 tablets daily 1 month then 1 tablet daily 1 month and stop 180 tablet 1 12/04/2014 at Unknown time  . warfarin (COUMADIN) 2.5 MG tablet Take 1 tablet (2.5 mg total) by mouth daily at 6 PM. 30 tablet 0 12/04/2014 at Unknown time   Scheduled:  Scheduled:  . allopurinol  300 mg Oral Daily  . atorvastatin  10 mg Oral Daily  . budesonide  0.25 mg  Nebulization BID  . cefUROXime  500 mg Oral BID WC  . diltiazem  360 mg Oral Daily  . escitalopram  20 mg Oral Daily  . furosemide  60 mg Oral BID  . gabapentin  300 mg Oral Daily  . metoprolol tartrate  12.5 mg Oral BID  . nystatin  5 mL Oral QID  . potassium chloride  40 mEq Oral Daily  . predniSONE  30 mg Oral Q breakfast  . sodium chloride  3 mL Intravenous Q12H  . sodium chloride  3 mL Intravenous Q12H  . Warfarin - Pharmacist Dosing Inpatient   Does not apply q1800   Infusion[s]: Infusions: Heparin to be started.  Antibiotic[s]: Anti-infectives    Start     Dose/Rate Route Frequency Ordered Stop   12/11/14 1700  cefUROXime (CEFTIN) tablet 500 mg     500 mg Oral 2 times daily with meals 12/11/14 0904 12/16/14 1659    12/07/14 1700  cefTRIAXone (ROCEPHIN) 1 g in dextrose 5 % 50 mL IVPB - Premix  Status:  Discontinued     1 g 100 mL/hr over 30 Minutes Intravenous Every 24 hours 12/07/14 1559 12/11/14 0903   12/05/14 1600  vancomycin (VANCOCIN) 1,250 mg in sodium chloride 0.9 % 250 mL IVPB  Status:  Discontinued     1,250 mg 166.7 mL/hr over 90 Minutes Intravenous Every 24 hours 12/04/14 1459 12/04/14 1701   12/05/14 1600  vancomycin (VANCOCIN) 1,250 mg in sodium chloride 0.9 % 250 mL IVPB  Status:  Discontinued     1,250 mg 166.7 mL/hr over 90 Minutes Intravenous Every 24 hours 12/05/14 0218 12/07/14 1559   12/05/14 1230  fluconazole (DIFLUCAN) tablet 100 mg  Status:  Discontinued     100 mg Oral Daily 12/05/14 1159 12/07/14 1559   12/04/14 2300  piperacillin-tazobactam (ZOSYN) IVPB 3.375 g  Status:  Discontinued     3.375 g 12.5 mL/hr over 240 Minutes Intravenous Every 8 hours 12/04/14 1459 12/04/14 1701   12/04/14 2100  cefTAZidime (FORTAZ) 2 g in dextrose 5 % 50 mL IVPB  Status:  Discontinued     2 g 100 mL/hr over 30 Minutes Intravenous Every 12 hours 12/04/14 1719 12/07/14 1559   12/04/14 1715  cefTAZidime (FORTAZ) 2 g in dextrose 5 % 50 mL IVPB  Status:  Discontinued     2 g 100 mL/hr over 30 Minutes Intravenous 3 times per day 12/04/14 1701 12/04/14 1719   12/04/14 1500  vancomycin (VANCOCIN) 2,000 mg in sodium chloride 0.9 % 500 mL IVPB  Status:  Discontinued     2,000 mg 250 mL/hr over 120 Minutes Intravenous  Once 12/04/14 1457 12/04/14 1701   12/04/14 1445  piperacillin-tazobactam (ZOSYN) IVPB 3.375 g     3.375 g 100 mL/hr over 30 Minutes Intravenous  Once 12/04/14 1437 12/04/14 1543   12/04/14 1445  vancomycin (VANCOCIN) IVPB 1000 mg/200 mL premix  Status:  Discontinued     1,000 mg 200 mL/hr over 60 Minutes Intravenous  Once 12/04/14 1437 12/04/14 1457      ASSESSMENT :  81 y.o. female is currently on chronic Coumadin for atrial fibrillation and history of DVT.   Today's INR 1.61.    INR currently Sub-therapeutic  Heparin bridging pending therapeutic INR and possible DCCV.  No evidence of bleeding complications observed.  GOAL :  Heparin Level  0.3 - 0.7 units/ml  TARGET INR 2-3   PLAN : 1. Begin Heparin infusion, No Bolus, at 1100 units/hr.  Will  check the first Heparin level with AM labs.  2. Daily Heparin level, INR, CBC, and Monitor for bleeding complications.  Follow Platlet counts.  Velda Shell,  Pharm.D   12/12/2014,  5:56 PM

## 2014-12-12 NOTE — Progress Notes (Signed)
Utilization Review completed. Tyriek Hofman RN BSN CM 

## 2014-12-12 NOTE — Care Management Important Message (Signed)
Important Message  Patient Details  Name: AISHAH TEFFETELLER MRN: [id number] Date of Birth: 05-18-1931   Medicare Important Message Given:  Yes-fourth notification given    Lawerance Sabal, RN 12/12/2014, 11:54 AMImportant Message  Patient Details  Name: BELVIE IRIBE MRN: [id number] Date of Birth: 1931/12/22   Medicare Important Message Given:  Yes-fourth notification given    Lawerance Sabal, RN 12/12/2014, 11:54 AM

## 2014-12-12 NOTE — Care Management Note (Signed)
Case Management Note  Patient Details  Name: [name] [name] MRN: [id number] Date of Birth: 10-15-1931  Subjective/Objective:                 Patient from home, receives services from [facility] for [facility] RN PT [facility]HA. Resumption of services order placed, [name] with [facility] notified of referral and probable discharge tomorrow. Patient has home oxygen through [facility] prior to admission. Patient states that she has all the DME she needs at home, denied assistance with medications.    Action/Plan:  Will anticipate DC tomorrow. HH with [name] set up 12/12/14. Oxygen through [facility] prior to admission.  Expected Discharge Date:                  Expected Discharge Plan:  Home w Home Health Services  In-House Referral:     Discharge planning Services  CM Consult  Post Acute Care Choice:  Resumption of Svcs/PTA Provider Choice offered to:  Patient  DME Arranged:    DME Agency:     HH Arranged:  Nurse's Aide, RN, PT HH Agency:  [facility]  Status of Service:  Completed, signed off  Medicare Important Message Given:  Yes-fourth notification given Date Medicare IM Given:    Medicare IM give by:    Date Additional Medicare IM Given:    Additional Medicare Important Message give by:     If discussed at Long Length of Stay Meetings, dates discussed:    Additional Comments:  Lawerance Sabal, RN 12/12/2014, 11:58 AM

## 2014-12-12 NOTE — Progress Notes (Signed)
ANTICOAGULATION CONSULT NOTE - Follow Up Consult  Pharmacy Consult for Coumadin Indication: atrial fibrillation and hx DVT  Allergies  Allergen Reactions  . Pirfenidone Other (See Comments)    Congestion, breathing diffulty    Patient Measurements: Height: 5\' 4"  (162.6 cm) Weight: 278 lb 14.1 oz (126.5 kg) IBW/kg (Calculated) : 54.7  Vital Signs: Temp: 97.6 F (36.4 C) (08/01 0533) Temp Source: Oral (08/01 0533) BP: 129/67 mmHg (08/01 0533) Pulse Rate: 78 (08/01 0533)  Labs:  Recent Labs  12/10/14 0243 12/11/14 0239 12/12/14 0528  HGB  --  13.5  --   HCT  --  41.9  --   PLT  --  90*  --   LABPROT 26.0* 22.4* 19.1*  INR 2.42* 1.98* 1.61*  CREATININE 1.27* 1.24* 1.27*    Estimated Creatinine Clearance: 45.7 mL/min (by C-G formula based on Cr of 1.27).  Assessment: 83yof admitted 12/04/2014 for AFib with RVR, encephalopathy, and met sepsis criteria on warfarin for hx Afib & DVT.   Patient with history of end-stage pulmonary fibrosis, chronic diastolic congestive heart failure, chronic hypoxemic respiratory failure, atrial fibrillation   Admission INR therapeutic. Patient did receive 3 days of fluconazole which increased INR rapidly. Lower warfarin doses given on 7/29 and 7/30. Now that fluconazole is likely out of patient's system, INR has dropped d/t lower doses. INR today 1.61.  PTA warfarin dose 2.5mg  daily  Goal of Therapy:  INR 2-3 Monitor platelets by anticoagulation protocol: Yes   Plan:  -warfarin 3mg  po x1 tonight -daily INR -follow for s/s bleeding  Allena Pietila D. Amarea Macdowell, PharmD, BCPS Clinical Pharmacist Pager: [phone] 12/12/2014 10:54 AM

## 2014-12-12 NOTE — Progress Notes (Signed)
[facility] PROGRESS NOTE  [name] [name] WUJ:[id number] DOB: 07-09-1931 DOA: 12/04/2014 PCP: Kerby Nora, MD  Assessment/Plan: 1. Sepsis due to UTI -Present on admission, evidenced by a white count of 20,000 thousand 500, respiratory 27, heart rate 150s, lactic acid of 4.15. -Source of infection could be urinary tract infection as u/a showed presence of pyuria and bacteria. -Patient showing clinical improvement with a downward trend in her white count to 15,000 from 22,000 on admission -She is currently hemodynamically stable and afebrile; will monitor clinical response -Continue antibiotic therapy now adjusted base on sensitivity and transition to Oral regimen  2.  Atrial fibrillation with rapid ventricular response. -Patient presented with A. fib with RVR having ventricular rates in the 140s to 150s. -She was started on a Cardizem drip currently having heart rates in the 90-110's -I suspect underlying infectious process and advanced lung disease precipitating A. fib with RVR -Heart rates better controlled now, but still elevated. Will continue cardizem to  daily and TID metoprolol (dose adjusted on 7/31); EP will be also consulted by cardiology. Will follow further rec's -CHADVASC score 5  3.  End-stage interstitial lung disease. -Patient having acute on chronic hypoxemic respiratory failure on admission with an increase in her oxygen requirements having a respiratory rate of 27. -Suspect patient have an acute flareup of interstitial lung disease -Chest x-ray repeated on 12/05/2014 remained stable. -Will continue tapering steroids slowly -velcro signs appreciated on exam; no wheezing and patient without SOB  4.  Chronic anticoagulation. -Pharmacy was consulted for Coumadin management -INR adjusted by pharmacy and therapeutic currently  5.  Chronic diastolic congestive heart failure -Transthoracic echocardiogram performed on 11/17/2014 showed preserved ejection fraction.  Diuretics were held on admission due to sepsis and soft BP. -Creatinine trended up from 1.43 on 12/05/2014 to 1.62 on 12/06/2014 and stable at 1.2 range on 8/1 -will follow BMET in am -Will continue Lasix at 60 mg by mouth twice a day, and continue monitoring her volume status and follow daily BMP's  6.  Proteus mirabilis and klebsiella Pneumoniae Urinary tract infection -Suspect may be cause of sepsis -following sensitivity will continue narrowed antibiotics; patient on ceftin now -no fever and no dysuria   7.  Hypokalemia. -potassium has been repleted and patient now started on daily maintenance potassium -will follow electrolytes trend   8. [name] [name]  -will continue patient on nystatin and magic mouthwash -improving/resolving  Code Status: Full code Family Communication: I spoke with her son last night Disposition Plan: home with home health therapy  Consultation:  Cardiology   Antibiotics:  Vancomycin 7/24>>7/27  Elita Quick 7/24>>7/27  Rocephin 7/27>>7/32  Ceftin 7/31>>8/3  HPI/Subjective: Patient w/o fever, CP or SOB. Reports no nausea, no vomiting. Patient HR improved, but still up and in A. Fib. She has not being seen by EP yet.  Objective: Filed Vitals:   12/12/14 1427  BP: 106/55  Pulse: 89  Temp: 97.8 F (36.6 C)  Resp: 25    Intake/Output Summary (Last 24 hours) at 12/12/14 1713 Last data filed at 12/12/14 1441  Gross per 24 hour  Intake    590 ml  Output    950 ml  Net   -360 ml   Filed Weights   12/10/14 0328 12/11/14 0401 12/12/14 0533  Weight: 126.6 kg (279 lb 1.6 oz) 127.7 kg (281 lb 8.4 oz) 126.5 kg (278 lb 14.1 oz)    Exam:   General:  Overall feeling Ok. No CP. She is awake and alert, oriented X 3,  in no acute distress and afebrile. Denies dysuria and abd pain. She can not feel palpitations; but HR still up with activity (but improved)  Cardiovascular: Irregular rate and rhythm, tachycardic, having bilateral 1-2+ lower extremity pitting  edema (unchanged from baseline according to patient)  Respiratory: mild positive fine crackles bilaterally, few rhonchi, on supplemental oxygen, does not appear to be in respiratory distress  Abdomen: Obese, soft nontender nondistended  Musculoskeletal: 1-2+ bilateral extremity pitting edema  Data Reviewed: Basic Metabolic Panel:  Recent Labs Lab 12/07/14 0355 12/07/14 1209 12/08/14 0228 12/10/14 0243 12/11/14 0239 12/12/14 0528  NA 137  --  137 137 137 138  K 2.7* 3.5 3.4* 4.1 4.4 4.7  CL 89*  --  87* 90* 92* 98*  CO2 37*  --  39* 36* 36* 35*  GLUCOSE 164*  --  152* 140* 131* 108*  BUN 69*  --  63* 49* 45* 42*  CREATININE 1.40*  --  1.43* 1.27* 1.24* 1.27*  CALCIUM 9.1  --  9.0 8.8* 8.9 9.2  MG 2.1  --   --  2.1  --   --    CBC:  Recent Labs Lab 12/06/14 0233 12/07/14 0220 12/08/14 0228 12/11/14 0239  WBC 20.2* 21.3* 20.6* 15.3*  HGB 14.5 14.5 14.8 13.5  HCT 44.2 43.5 45.1 41.9  MCV 99.8 98.6 100.2* 101.9*  PLT 162 161 141* 90*   BNP (last 3 results)  Recent Labs  10/14/14 0420 11/16/14 1851 12/04/14 1423  BNP 301.7* 160.9* 280.1*    ProBNP (last 3 results)  Recent Labs  02/22/14 0947 02/24/14 0332 03/07/14 0648  PROBNP 2319.0* 2410.0* 437.5    CBG: No results for input(s): GLUCAP in the last 168 hours.  Recent Results (from the past 240 hour(s))  Blood culture (routine x 2)     Status: None   Collection Time: 12/04/14  2:09 PM  Result Value Ref Range Status   Specimen Description BLOOD LEFT HAND  Final   Special Requests BOTTLES DRAWN AEROBIC AND ANAEROBIC 5CC  Final   Culture NO GROWTH 5 DAYS  Final   Report Status 12/09/2014 FINAL  Final  Urine C&S     Status: None   Collection Time: 12/04/14  2:17 PM  Result Value Ref Range Status   Specimen Description URINE, CLEAN CATCH  Final   Special Requests NONE  Final   Culture   Final    >=100,000 COLONIES/mL PROTEUS MIRABILIS 30,000 COLONIES/mL KLEBSIELLA PNEUMONIAE    Report Status  12/08/2014 FINAL  Final   Organism ID, Bacteria PROTEUS MIRABILIS  Final   Organism ID, Bacteria KLEBSIELLA PNEUMONIAE  Final      Susceptibility   Klebsiella pneumoniae - MIC*    AMPICILLIN >=32 RESISTANT Resistant     CEFAZOLIN <=4 SENSITIVE Sensitive     CEFTRIAXONE <=1 SENSITIVE Sensitive     CIPROFLOXACIN <=0.25 SENSITIVE Sensitive     GENTAMICIN <=1 SENSITIVE Sensitive     IMIPENEM <=0.25 SENSITIVE Sensitive     NITROFURANTOIN 64 INTERMEDIATE Intermediate     TRIMETH/SULFA <=20 SENSITIVE Sensitive     AMPICILLIN/SULBACTAM 8 SENSITIVE Sensitive     PIP/TAZO <=4 SENSITIVE Sensitive     * 30,000 COLONIES/mL KLEBSIELLA PNEUMONIAE   Proteus mirabilis - MIC*    AMPICILLIN <=2 SENSITIVE Sensitive     CEFAZOLIN 8 SENSITIVE Sensitive     CEFTRIAXONE <=1 SENSITIVE Sensitive     CIPROFLOXACIN <=0.25 SENSITIVE Sensitive     GENTAMICIN <=1 SENSITIVE Sensitive  IMIPENEM 4 SENSITIVE Sensitive     NITROFURANTOIN 256 RESISTANT Resistant     TRIMETH/SULFA <=20 SENSITIVE Sensitive     AMPICILLIN/SULBACTAM <=2 SENSITIVE Sensitive     PIP/TAZO <=4 SENSITIVE Sensitive     * >=100,000 COLONIES/mL PROTEUS MIRABILIS  Blood culture (routine x 2)     Status: None   Collection Time: 12/04/14  3:02 PM  Result Value Ref Range Status   Specimen Description BLOOD RIGHT ANTECUBITAL  Final   Special Requests BOTTLES DRAWN AEROBIC AND ANAEROBIC 5CC  Final   Culture NO GROWTH 5 DAYS  Final   Report Status 12/09/2014 FINAL  Final  MRSA PCR Screening     Status: Abnormal   Collection Time: 12/04/14  4:45 PM  Result Value Ref Range Status   MRSA by PCR POSITIVE (A) NEGATIVE Final    Comment:        The GeneXpert MRSA Assay (FDA approved for NASAL specimens only), is one component of a comprehensive MRSA colonization surveillance program. It is not intended to diagnose MRSA infection nor to guide or monitor treatment for MRSA infections. CRITICAL RESULT CALLED TO, READ BACK BY AND VERIFIED  WITH: M.NORMENT,RN 12/04/14 @2010  BY V.WILKINS      Studies: No results found.  Scheduled Meds: . allopurinol  300 mg Oral Daily  . atorvastatin  10 mg Oral Daily  . budesonide  0.25 mg Nebulization BID  . cefUROXime  500 mg Oral BID WC  . diltiazem  360 mg Oral Daily  . escitalopram  20 mg Oral Daily  . furosemide  60 mg Oral BID  . gabapentin  300 mg Oral Daily  . metoprolol tartrate  12.5 mg Oral BID  . nystatin  5 mL Oral QID  . potassium chloride  40 mEq Oral Daily  . predniSONE  30 mg Oral Q breakfast  . sodium chloride  3 mL Intravenous Q12H  . sodium chloride  3 mL Intravenous Q12H  . warfarin  3 mg Oral ONCE-1800  . Warfarin - Pharmacist Dosing Inpatient   Does not apply q1800   Continuous Infusions:    Principal Problem:   Sepsis Active Problems:   CKD (chronic kidney disease) stage 3, GFR 30-59 ml/min   Interstitial lung disease   Debilitated   UTI (lower urinary tract infection)   Atrial fibrillation with RVR   FTT (failure to thrive) in adult   Blood poisoning   Weakness    Time spent: 30 minutes    Vassie Loll  [facility] Pager [phone]. If 7PM-7AM, please contact night-coverage at [email or web address], password [facility] 12/12/2014, 5:13 PM  LOS: 8 days

## 2014-12-12 NOTE — Progress Notes (Signed)
Physical Therapy Treatment Patient Details Name: [name] [name] MRN: [id number] DOB: 07-08-1931 Today's Date: 12/12/2014    History of Present Illness Patient is an 81 year old female with a past medical history of end-stage pulmonary fibrosis, chronic diastolic congestive heart failure, chronic hypoxemic respiratory failure, atrial fibrillation who was admitted to the medicine service on 12/04/2014 when she presented with complaints of a generalized weakness and functional decline. She had a recent hospitalization for acute on chronic hypoxemic respiratory failure believed to be secondary to acute flareup of end-stage interstitial lung disease and viral infection. She was discharged to inpatient rehabilitation. She was discharged from inpatient rehabilitation on 11/24/2014 and had been doing poorly at home. She was found to be in atrial fibrillation rapid ventricular response, encephalopathic, having sepsis.  Dx wth UTI.     PT Comments    Pt is progressing slowly, related to our ability to push her due to high rate a-fib.  Pt very limited in her mobility at baseline.  Next treatment, planning on gait and exercises as long as HR is better controlled.  Pt wants to go home at discharge.  Recommend [facility] services.  PT will continue to follow acutely.   Follow Up Recommendations  Home health PT;Supervision/Assistance - 24 hour (pt refusing SNF)     Equipment Recommendations  None recommended by PT    Recommendations for Other Services OT consult     Precautions / Restrictions Precautions Precautions: Fall Precaution Comments: monitor O2 sats and HR Restrictions Weight Bearing Restrictions: No    Mobility     Transfers Overall transfer level: Needs assistance Equipment used: Rolling walker (2 wheeled) Transfers: Sit to/from Stand Sit to Stand: Min guard         General transfer comment: Min guard assist for safety.  Pt with uncontrolled descent (better second time with cues to  recliner chair)  Ambulation/Gait Ambulation/Gait assistance: Min guard Ambulation Distance (Feet): 15 Feet (x2 with seated rest break) Assistive device: Rolling walker (2 wheeled) Gait Pattern/deviations: Step-through pattern;Shuffle;Trunk flexed Gait velocity: decreased Gait velocity interpretation: Below normal speed for age/gender General Gait Details: Pt with shuffling gait pattern, increased DOE 3-4/4 with gait.  Min guard assist for safety.   Per pt report this is about how far she can walk at home at baseline before resting.           Balance Overall balance assessment: Needs assistance         Standing balance support: Bilateral upper extremity supported Standing balance-Leahy Scale: Poor                      Cognition Arousal/Alertness: Awake/alert Behavior During Therapy: WFL for tasks assessed/performed Overall Cognitive Status: Within Functional Limits for tasks assessed                         General Comments General comments (skin integrity, edema, etc.): HR spikes to 135 during tx. pt in a-fib on HR control meds.  At rest anywhere between 74 and 110s.  O2 sats stable on 5 L O2 [place] mid to low 90s.        Pertinent Vitals/Pain Pain Assessment: No/denies pain           PT Goals (current goals can now be found in the care plan section) Acute Rehab PT Goals Patient Stated Goal: to get stronger so i dont have to be a burden to my kids Progress towards PT goals: Progressing toward  goals    Frequency  Min 3X/week    PT Plan Current plan remains appropriate       End of Session Equipment Utilized During Treatment: Oxygen Activity Tolerance: Patient limited by fatigue;Other (comment);Treatment limited secondary to medical complications (Comment) (limited by DOE) Patient left: in chair;with call bell/phone within reach     Time: [phone] (spent time looking for pillows and O2) PT Time Calculation (min) (ACUTE ONLY): 24  min  Charges:  $Therapeutic Activity: 8-22 mins                      Mariano Doshi B. Zeriyah Wain, PT, DPT [phone]   12/12/2014, 12:24 PM

## 2014-12-12 NOTE — Progress Notes (Signed)
SUBJECTIVE:  No complaints  OBJECTIVE:   Vitals:   Filed Vitals:   12/11/14 2053 12/11/14 2223 12/11/14 2326 12/12/14 0533  BP:  105/66  129/67  Pulse:  81 72 78  Temp:  98.4 F (36.9 C)  97.6 F (36.4 C)  TempSrc:  Oral  Oral  Resp:  Height:      Weight:    278 lb 14.1 oz (126.5 kg)  SpO2: 91% 91% 93% 92%   I&O's:   Intake/Output Summary (Last 24 hours) at 12/12/14 0825 Last data filed at 12/12/14 1610  Gross per 24 hour  Intake    460 ml  Output    900 ml  Net   -440 ml   TELEMETRY: Reviewed telemetry pt in atrial fibrillation with RVR:     PHYSICAL EXAM General: Well developed, well nourished, in no acute distress Head: Eyes PERRLA, No xanthomas.   Normal cephalic and atramatic  Lungs:   Clear bilaterally to auscultation and percussion. Heart:   Irregularly irregular S1 S2 Pulses are 2+ & equal. Abdomen: Bowel sounds are positive, abdomen soft and non-tender without masses  Extremities:   No clubbing, cyanosis or edema.  DP +1 Neuro: Alert and oriented X 3. Psych:  Good affect, responds appropriately   LABS: Basic Metabolic Panel:  Recent Labs  96/04/54 0243 12/11/14 0239 12/12/14 0528  NA 137 137 138  K 4.1 4.4 4.7  CL 90* 92* 98*  CO2 36* 36* 35*  GLUCOSE 140* 131* 108*  BUN 49* 45* 42*  CREATININE 1.27* 1.24* 1.27*  CALCIUM 8.8* 8.9 9.2  MG 2.1  --   --    Liver Function Tests: No results for input(s): AST, ALT, ALKPHOS, BILITOT, PROT, ALBUMIN in the last 72 hours. No results for input(s): LIPASE, AMYLASE in the last 72 hours. CBC:  Recent Labs  12/11/14 0239  WBC 15.3*  HGB 13.5  HCT 41.9  MCV 101.9*  PLT 90*   Cardiac Enzymes: No results for input(s): CKTOTAL, CKMB, CKMBINDEX, TROPONINI in the last 72 hours. BNP: Invalid input(s): POCBNP D-Dimer: No results for input(s): DDIMER in the last 72 hours. Hemoglobin A1C: No results for input(s): HGBA1C in the last 72 hours. Fasting Lipid Panel: No results for input(s):  CHOL, HDL, LDLCALC, TRIG, CHOLHDL, LDLDIRECT in the last 72 hours. Thyroid Function Tests: No results for input(s): TSH, T4TOTAL, T3FREE, THYROIDAB in the last 72 hours.  Invalid input(s): FREET3 Anemia Panel: No results for input(s): VITAMINB12, FOLATE, FERRITIN, TIBC, IRON, RETICCTPCT in the last 72 hours. Coag Panel:   Lab Results  Component Value Date   INR 1.61* 12/12/2014   INR 1.98* 12/11/2014   INR 2.42* 12/10/2014    RADIOLOGY: Dg Chest 2 View  12/05/2014   CLINICAL DATA:  Sepsis, CHF, interstitial lung disease, chronic renal insufficiency, failure to thrive.  EXAM: CHEST  2 VIEW  COMPARISON:  Portable chest x-ray of December 04, 2014  FINDINGS: The lungs are borderline hypoinflated especially on the right. This is stable. The interstitial markings remain coarse. There is pleural fluid versus pleural thickening in the apices and along the right lateral thoracic wall which is also stable. The lateral aspect of the left hemidiaphragm remains obscured. The cardiac silhouette is enlarged. The pulmonary vascularity is engorged. The bony thorax exhibits no acute abnormality. There is moderate degenerative disc disease of the thoracic spine.  IMPRESSION: CHF with mild pulmonary interstitial edema. Biapical pleural fluid or pleural thickening. Left lower lobe atelectasis  or pneumonia laterally, stable.   Electronically Signed   By: David  [place] M.D.   On: 12/05/2014 07:50   Dg Chest 2 View  11/17/2014   CLINICAL DATA:  Acute respiratory failure and hypoxia  EXAM: CHEST  2 VIEW  COMPARISON:  Portable chest x-ray of November 16, 2014  FINDINGS: AP and lateral semi-upright views of the chest are reviewed. The lungs are reasonably well inflated. There is pleural fluid versus pleural thickening along the periphery of the pleural surface surrounding the right lung. This is stable. The pulmonary interstitial markings remain increased. There are confluent alveolar opacities bilaterally which are less conspicuous  today. The cardiac silhouette is enlarged. The central pulmonary vascularity is engorged but improved since yesterday's study. The trachea is midline. There is no pneumothorax.  IMPRESSION: Slight interval improvement in the appearance of the pulmonary vascularity and pulmonary interstitium which suggest minimal improvement in CHF. There is no alveolar pneumonia. Chronic pleural thickening versus pleural fluid is stable surrounding the right lung.   Electronically Signed   By: David  [place] M.D.   On: 11/17/2014 07:48   Dg Chest Portable 1 View  12/04/2014   CLINICAL DATA:  Weakness, recent hospitalization  EXAM: PORTABLE CHEST - 1 VIEW  COMPARISON:  11/24/2014  FINDINGS: Moderate enlargement of the cardiomediastinal silhouette is noted. Lung volumes are low with marked crowding of the bronchovascular markings but no focal opacity allowing for portable technique. Trace if any pleural fluid. Cardiac leads obscure detail.  IMPRESSION: Low volume exam without focal acute finding. If symptoms persist, consider PA and lateral chest radiographs obtained at full inspiration when the patient is clinically able.   Electronically Signed   By: Christiana Pellant M.D.   On: 12/04/2014 14:27   Dg Chest Portable 1 View  11/24/2014   CLINICAL DATA:  Respiratory failure  EXAM: PORTABLE CHEST - 1 VIEW  COMPARISON:  Portable chest x-ray of November 22, 2014  FINDINGS: The lungs are reasonably well inflated. The interstitial markings are coarse and remain increased bilaterally. The left lower lobe is dense and there is obscuration of the left hemidiaphragm consistent with pleural fluid. The cardiac silhouette is enlarged. The pulmonary vascularity is prominent centrally. There is pleural fluid versus pleural thickening over the superior lateral aspects of both lungs. The bony thorax exhibits no acute abnormality.  IMPRESSION: There has not been significant change since yesterday's study. CHF with small bilateral pleural effusions and  interstitial edema and/or bilateral interstitial pneumonia persists.   Electronically Signed   By: David  [place] M.D.   On: 11/24/2014 07:42   Dg Chest Port 1 View  11/22/2014   CLINICAL DATA:  Edema and shortness of breath; acute on chronic respiratory failure.  EXAM: PORTABLE CHEST - 1 VIEW  COMPARISON:  Portable chest x-ray of July 9th 2016  FINDINGS: The lungs are better inflated today. There remain coarse interstitial opacities bilaterally. The left hemidiaphragm remains obscured. Small bilateral pleural effusions persist. The cardiac silhouette pulmonary vascularity are engorged. The mediastinum is less prominent in width today. The trachea is midline. The bony thorax exhibits no acute abnormality.  IMPRESSION: There has been mild interval improvement in the appearance of the pulmonary interstitium which may indicate improving edema or pneumonia. There remain small bilateral pleural effusions.   Electronically Signed   By: David  [place] M.D.   On: 11/22/2014 07:44   Dg Chest Port 1 View  11/19/2014   CLINICAL DATA:  Lung disease  EXAM: PORTABLE CHEST - 1 VIEW  COMPARISON:  11/17/2014  FINDINGS: Lungs are very under aerated. Heterogeneous opacities throughout both lungs have increased. The heart is enlarged. No pneumothorax.  IMPRESSION: Worsening patchy bilateral airspace disease.   Electronically Signed   By: Jolaine Click M.D.   On: 11/19/2014 09:35   Dg Chest Port 1 View  11/16/2014   CLINICAL DATA:  Dyspnea.  Interstitial lung disease.  EXAM: PORTABLE CHEST - 1 VIEW  COMPARISON:  Single view of the chest 10/13/2014 and 10/15/2014. CT chest 08/03/2014.  FINDINGS: Heart size is upper normal. Chronic interstitial coarsening does not appear notably changed. There is no evidence of pulmonary edema. No consolidative process pneumothorax is identified.  IMPRESSION: No acute abnormality. Appearance of the chest most consistent with chronic interstitial lung disease.   Electronically Signed   By: Drusilla Kanner M.D.   On: 11/16/2014 17:52   Assessment/Plan:   Afib: Failed DCCV.  No amiodarone due to interstitial lung diseae. Rx INR sub therapuetic on coumadin.Pharmacy mangaing.   Continue cardizem and beta blocker. HR up a little this am so will give am meds now. Hold on increasing BB given soft BP last night.  Her GFR is 46 and sotolol is contraindicated for GFR under 40. Also ? Multaq. Will ask EP to see this am. Note ECG from June patient was in NSR  Diastolic CHF: Stable continue bid lasix   COPD/ILD: On chronic steroids cushingoid ?   Chol: On statin .      Quintella Reichert, MD  12/12/2014  8:25 AM

## 2014-12-12 NOTE — Consult Note (Signed)
ELECTROPHYSIOLOGY CONSULT NOTE   Patient ID: [name] [name] MRN: [id number], DOB/AGE: 12-24-1931   Admit date: 12/04/2014 Date of Consult: 12/12/2014   Primary Physician: Kerby Nora, MD Primary Cardiologist: Concha Se, MD   Pt. Profile  81 y/o female with a h/o PAF whom EP has been asked to eval in the setting of recurrent afib with sepsis, with prior h/o ILD, CKD III, and relative hypotension.  Problem List  Past Medical History  Diagnosis Date  . Chronic diastolic CHF (congestive heart failure)     a. ECHO 6/0: EF 55-60%, mild LVH, mildly dilated RV w. mildly dec fx, RVSP 67;  b. 11/2014 Echo: EF 55-60%, mildly dil LA, RV dilated and hypertrophied - ? mass on parasternal images, PASP .  [name] Chronic atrial fibrillation     a. failed cardioversion in past - repeat DCCV on October 5th, 2010;  b. recurrent Afib 11/2014 in setting of sepsis;  c. CHA2DS2VASc = 5-6 (nonobs CAD)-->Coumadin.  . Personal history of DVT (deep vein thrombosis) 2008    LLE  . HTN (hypertension)   . Morbid obesity   . Shingles     with postherpetic neuraigia  . GERD (gastroesophageal reflux disease)   . Hyperlipidemia   . Meningitis ~ 1960    hospitalized @ [facility]  . Gallstones     s/p cholecystectomy  . Non-obstructive CAD     a. cath 8/10 - LAD 40-50% w mild PAH mean 29 w PVR 3.2 Woods units.  . PE (pulmonary embolism) 2008  . Lung interstitial disease   . Hypoxemia   . COPD (chronic obstructive pulmonary disease)   . On home oxygen therapy     "5L; 24/7" (10/11/2014)  . OSA treated with BiPAP     "I think it's BiPAP"  . AAA (abdominal aortic aneurysm)     small  . History of blood transfusion   . Iron deficiency anemia   . Gout   . Depression   . Chronic kidney disease (CKD), stage II (mild)     Past Surgical History  Procedure Laterality Date  . Appendectomy  1947  . Tonsillectomy  1946  . Cardioversion  01/2007    x2  . Right heart catheterization N/A 02/28/2014    Procedure:  RIGHT HEART CATH;  Surgeon: Laurey Morale, MD;  Location: [facility] CATH LAB;  Service: Cardiovascular;  Laterality: N/A;  . Cardiac catheterization  02/2014  . Cataract extraction w/ intraocular lens  implant, bilateral Bilateral      Allergies  Allergies  Allergen Reactions  . Pirfenidone Other (See Comments)    Congestion, breathing diffulty    HPI   81 y/o female with the above complex past medical history.  She has a h/o PAF and is s/p DCCV in 02/2009. She has been maintained on oral CCB therapy @ home and is also chronically anticoagulated with coumadin @ home.  She was recently admitted in early July with respiratory failure in the setting of diastolic CHF and interstitial lung disease.  EF was 55-60% by echo that admission.  She was diuresed and placed on higher dose steroids with improvement.  She was subsequently discharged from inpatient rehab on 12/01/2014.   [name] [name] was readmitted on 7/24 in the setting of generalized weakness, malaise, and poor exercise tolerance.  She was found to be in Afib with RVR.  She was felt to be septic with leukocytosis and a lactate of 4.15.  She was placed on IV dilt  and broad spectrum abx and admitted for further eval.  Cardiology was consulted on 7/29 and recommended titration of dilt to 360 mg daily.  BB was added.  She has remained in afib with rates in the 100-120 range.  Because of CKD III with GFR of 46, she is not felt to be a candidate for sotalol (contraindicated in pts with GFR < 40).  Amio has been avoided 2/2 h/o interstitial lung disease.  EP has been asked to eval.  She feels that she is recovering from urosepsis but continues to feel deconditioned with tachy/irregular palpitations and mild dyspnea.  She thinks that she would feel much better if she were just in sinus rhythm.  Inpatient Medications  . allopurinol  300 mg Oral Daily  . atorvastatin  10 mg Oral Daily  . budesonide  0.25 mg Nebulization BID  . cefUROXime  500 mg Oral BID  WC  . diltiazem  360 mg Oral Daily  . escitalopram  20 mg Oral Daily  . furosemide  60 mg Oral BID  . gabapentin  300 mg Oral Daily  . metoprolol tartrate  12.5 mg Oral BID  . nystatin  5 mL Oral QID  . potassium chloride  40 mEq Oral Daily  . predniSONE  30 mg Oral Q breakfast  . sodium chloride  3 mL Intravenous Q12H  . sodium chloride  3 mL Intravenous Q12H  . Warfarin - Pharmacist Dosing Inpatient   Does not apply q1800    Family History Family History  Problem Relation Age of Onset  . Heart failure Mother   . Depression Mother   . Ovarian cancer Paternal Grandmother   . Heart disease Paternal Grandfather   . Obesity Other      Social History History   Social History  . Marital Status: Widowed    Spouse Name: N/A  . Number of Children: N/A  . Years of Education: N/A   Occupational History  . Not on file.   Social History Main Topics  . Smoking status: Never Smoker   . Smokeless tobacco: Never Used  . Alcohol Use: Yes     Comment: 10/11/2014 "used to have a drink on holidays; don't drink at all now"  . Drug Use: No  . Sexual Activity: No   Other Topics Concern  . Not on file   Social History Narrative   Widowed. Retired from Teacher, English as a foreign language.     Review of Systems  General:  No chills, fever, night sweats or weight changes.  Cardiovascular:  No chest pain, +++ dyspnea on exertion (acute on chronic), bilat le edema, no orthopnea, +++ palpitations, no paroxysmal nocturnal dyspnea. Dermatological: No rash, lesions/masses Respiratory: No cough, +++ dyspnea Urologic: No hematuria, dysuria Abdominal:   No nausea, vomiting, +++diarrhea on admission, no bright red blood per rectum, melena, or hematemesis Neurologic:  No visual changes, wkns, changes in mental status. All other systems reviewed and are otherwise negative except as noted above.  Physical Exam  Blood pressure 129/67, pulse 78, temperature 97.6 F (36.4 C), temperature source Oral, resp. rate 24,  height 5\' 4"  (1.626 m), weight 278 lb 14.1 oz (126.5 kg), SpO2 92 %.  General: Pleasant, obese, NAD Psych: Normal affect. Neuro: Alert and oriented X 3. Moves all extremities spontaneously. HEENT: Normal  Neck: Supple without bruits.  Difficult to assess jvp 2/2 girth. Lungs:  Resp regular and unlabored, crackles throughout. Heart: IR, IR, distant heart sounds. no appreciable s3, s4, or murmurs. Abdomen: Soft, obese,  non-tender, non-distended, BS + x 4.  Extremities: No clubbing, cyanosis.  1+ bilat ankle edema. DP/PT/Radials 1+ and equal bilaterally.  Labs   Lab Results  Component Value Date   WBC 15.3* 12/11/2014   HGB 13.5 12/11/2014   HCT 41.9 12/11/2014   MCV 101.9* 12/11/2014   PLT 90* 12/11/2014    Recent Labs Lab 12/12/14 0528  NA 138  K 4.7  CL 98*  CO2 35*  BUN 42*  CREATININE 1.27*  CALCIUM 9.2  GLUCOSE 108*   Lab Results  Component Value Date   CHOL 115 03/03/2014   HDL 49 03/03/2014   LDLCALC 44 03/03/2014   TRIG 110 03/03/2014   Lab Results  Component Value Date   INR 1.61* 12/12/2014   INR 1.98* 12/11/2014   INR 2.42* 12/10/2014    Radiology/Studies  Dg Chest 2 View  12/05/2014   CLINICAL DATA:  Sepsis, CHF, interstitial lung disease, chronic renal insufficiency, failure to thrive.  EXAM: CHEST  2 VIEW  COMPARISON:  Portable chest x-ray of December 04, 2014  FINDINGS: The lungs are borderline hypoinflated especially on the right. This is stable. The interstitial markings remain coarse. There is pleural fluid versus pleural thickening in the apices and along the right lateral thoracic wall which is also stable. The lateral aspect of the left hemidiaphragm remains obscured. The cardiac silhouette is enlarged. The pulmonary vascularity is engorged. The bony thorax exhibits no acute abnormality. There is moderate degenerative disc disease of the thoracic spine.  IMPRESSION: CHF with mild pulmonary interstitial edema. Biapical pleural fluid or pleural  thickening. Left lower lobe atelectasis or pneumonia laterally, stable.   Electronically Signed   By: David  [place] M.D.   On: 12/05/2014 07:50   Dg Chest 2 View  11/17/2014   CLINICAL DATA:  Acute respiratory failure and hypoxia  EXAM: CHEST  2 VIEW  COMPARISON:  Portable chest x-ray of November 16, 2014  FINDINGS: AP and lateral semi-upright views of the chest are reviewed. The lungs are reasonably well inflated. There is pleural fluid versus pleural thickening along the periphery of the pleural surface surrounding the right lung. This is stable. The pulmonary interstitial markings remain increased. There are confluent alveolar opacities bilaterally which are less conspicuous today. The cardiac silhouette is enlarged. The central pulmonary vascularity is engorged but improved since yesterday's study. The trachea is midline. There is no pneumothorax.  IMPRESSION: Slight interval improvement in the appearance of the pulmonary vascularity and pulmonary interstitium which suggest minimal improvement in CHF. There is no alveolar pneumonia. Chronic pleural thickening versus pleural fluid is stable surrounding the right lung.   Electronically Signed   By: David  [place] M.D.   On: 11/17/2014 07:48   Dg Chest Portable 1 View  12/04/2014   CLINICAL DATA:  Weakness, recent hospitalization  EXAM: PORTABLE CHEST - 1 VIEW  COMPARISON:  11/24/2014  FINDINGS: Moderate enlargement of the cardiomediastinal silhouette is noted. Lung volumes are low with marked crowding of the bronchovascular markings but no focal opacity allowing for portable technique. Trace if any pleural fluid. Cardiac leads obscure detail.  IMPRESSION: Low volume exam without focal acute finding. If symptoms persist, consider PA and lateral chest radiographs obtained at full inspiration when the patient is clinically able.   Electronically Signed   By: Christiana Pellant M.D.   On: 12/04/2014 14:27   Dg Chest Portable 1 View  11/24/2014   CLINICAL DATA:   Respiratory failure  EXAM: PORTABLE CHEST - 1 VIEW  COMPARISON:  Portable chest x-ray of November 22, 2014  FINDINGS: The lungs are reasonably well inflated. The interstitial markings are coarse and remain increased bilaterally. The left lower lobe is dense and there is obscuration of the left hemidiaphragm consistent with pleural fluid. The cardiac silhouette is enlarged. The pulmonary vascularity is prominent centrally. There is pleural fluid versus pleural thickening over the superior lateral aspects of both lungs. The bony thorax exhibits no acute abnormality.  IMPRESSION: There has not been significant change since yesterday's study. CHF with small bilateral pleural effusions and interstitial edema and/or bilateral interstitial pneumonia persists.   Electronically Signed   By: David  [place] M.D.   On: 11/24/2014 07:42   Dg Chest Port 1 View  11/22/2014   CLINICAL DATA:  Edema and shortness of breath; acute on chronic respiratory failure.  EXAM: PORTABLE CHEST - 1 VIEW  COMPARISON:  Portable chest x-ray of July 9th 2016  FINDINGS: The lungs are better inflated today. There remain coarse interstitial opacities bilaterally. The left hemidiaphragm remains obscured. Small bilateral pleural effusions persist. The cardiac silhouette pulmonary vascularity are engorged. The mediastinum is less prominent in width today. The trachea is midline. The bony thorax exhibits no acute abnormality.  IMPRESSION: There has been mild interval improvement in the appearance of the pulmonary interstitium which may indicate improving edema or pneumonia. There remain small bilateral pleural effusions.   Electronically Signed   By: David  [place] M.D.   On: 11/22/2014 07:44   Dg Chest Port 1 View  11/19/2014   CLINICAL DATA:  Lung disease  EXAM: PORTABLE CHEST - 1 VIEW  COMPARISON:  11/17/2014  FINDINGS: Lungs are very under aerated. Heterogeneous opacities throughout both lungs have increased. The heart is enlarged. No pneumothorax.   IMPRESSION: Worsening patchy bilateral airspace disease.   Electronically Signed   By: Jolaine Click M.D.   On: 11/19/2014 09:35   Dg Chest Port 1 View  11/16/2014   CLINICAL DATA:  Dyspnea.  Interstitial lung disease.  EXAM: PORTABLE CHEST - 1 VIEW  COMPARISON:  Single view of the chest 10/13/2014 and 10/15/2014. CT chest 08/03/2014.  FINDINGS: Heart size is upper normal. Chronic interstitial coarsening does not appear notably changed. There is no evidence of pulmonary edema. No consolidative process pneumothorax is identified.  IMPRESSION: No acute abnormality. Appearance of the chest most consistent with chronic interstitial lung disease.   Electronically Signed   By: Drusilla Kanner M.D.   On: 11/16/2014 17:52    ECG  Afib, 137, delayed R progression.  No acute st/t changes.   From Tele strips avg QTc 437.8.  ASSESSMENT AND PLAN  1.  Persistent Atrial Fibrillation:  Pt has a h/o afib and is s/p DCCV in 2010.  Review of older ECG's shows that she maintained sinus rhythm from that time until this admission.  She remains in AF with rates in the 100-120's despite high dose CCB and the addition of BB therapy.  INR was Rx on admission, but has been subRx the past 2 days (1.61) w/o bridging.  I will add heparin for bridging.  We have several options for mgmt of her rhythm including TEE/DCCV (TEE req since subRx x 48 hrs) and continued dilt/bb therapy as recovery from sepsis progresses, TEE and antiarrhythmic therapy followed by DCCV if she does not convert (as previously noted, she is a poor candidate for amio secondary to ILD.  Sotalol not ideal in setting of CHF (diastolic) and sub-40 GFR.  QTc averages to ~437  msec, thus tikosyn may be a viable option.  Creat Cl by Cockcroft-Gault using her actual weight is 69.2 ml/min), or rate control with anticoagulation x 4 wks followed by DCCV.  Unfortunately, given her size and lung dzs, she is not likely to be an ideal TEE candidate.  She mentions that Dr. Mariah Milling  has considered initiating eliquis in the past.  We could opt to d/c coumadin and begin this during this admission.  Will d/w Dr. Graciela Husbands.    2.  Urosepsis:  Per IM.  Signed, Nicolasa Ducking, NP 12/12/2014, 8:22 AM   I have seen, examined the patient, and reviewed the above assessment and plan. On exam, morbidly obese, iRRR.  Chronically ill. Changes to above are made where necessary.   Given comorbidities, obesity, and OSA, I anticipate that her ability to maintain sinus rhythm long term is quite low.  At this time, I would favor rate control.  Continue coumadin.  Could return electively for cardioversion after INR has been therapeutic for 3 weeks after sepsis has fully resolved and patient has recovered.  Co Sign: Hillis Range, MD 12/13/2014 3:31 PM

## 2014-12-12 NOTE — Discharge Instructions (Signed)

## 2014-12-12 NOTE — Progress Notes (Signed)
Resumption of services for HH order placed. Patient received HH PT RN [facility]. [name] with [facility] notified.

## 2014-12-13 ENCOUNTER — Inpatient Hospital Stay (HOSPITAL_COMMUNITY): Payer: Medicare Other

## 2014-12-13 ENCOUNTER — Other Ambulatory Visit: Payer: Self-pay | Admitting: *Deleted

## 2014-12-13 DIAGNOSIS — J9621 Acute and chronic respiratory failure with hypoxia: Secondary | ICD-10-CM | POA: Insufficient documentation

## 2014-12-13 LAB — CBC
HEMATOCRIT: 44.1 % (ref 36.0–46.0)
HEMOGLOBIN: 14.4 g/dL (ref 12.0–15.0)
MCH: 33.8 pg (ref 26.0–34.0)
MCHC: 32.7 g/dL (ref 30.0–36.0)
MCV: 103.5 fL — ABNORMAL HIGH (ref 78.0–100.0)
Platelets: 92 10*3/uL — ABNORMAL LOW (ref 150–400)
RBC: 4.26 MIL/uL (ref 3.87–5.11)
RDW: 16.3 % — ABNORMAL HIGH (ref 11.5–15.5)
WBC: 10.1 10*3/uL (ref 4.0–10.5)

## 2014-12-13 LAB — PROTIME-INR
INR: 1.57 — AB (ref 0.00–1.49)
Prothrombin Time: 18.8 seconds — ABNORMAL HIGH (ref 11.6–15.2)

## 2014-12-13 LAB — BASIC METABOLIC PANEL
Anion gap: 12 (ref 5–15)
BUN: 44 mg/dL — ABNORMAL HIGH (ref 6–20)
CO2: 32 mmol/L (ref 22–32)
Calcium: 9.1 mg/dL (ref 8.9–10.3)
Chloride: 94 mmol/L — ABNORMAL LOW (ref 101–111)
Creatinine, Ser: 1.38 mg/dL — ABNORMAL HIGH (ref 0.44–1.00)
GFR calc Af Amer: 40 mL/min — ABNORMAL LOW (ref >60)
GFR calc non Af Amer: 35 mL/min — ABNORMAL LOW (ref >60)
Glucose, Bld: 92 mg/dL (ref 65–99)
Potassium: 4.4 mmol/L (ref 3.5–5.1)
Sodium: 138 mmol/L (ref 135–145)

## 2014-12-13 LAB — HEPARIN LEVEL (UNFRACTIONATED)
HEPARIN UNFRACTIONATED: 0.29 [IU]/mL — AB (ref 0.30–0.70)
Heparin Unfractionated: 0.31 IU/mL (ref 0.30–0.70)

## 2014-12-13 MED ORDER — ATORVASTATIN CALCIUM 10 MG PO TABS: 10.0000 mg | ORAL_TABLET | Freq: Every day | ORAL | Status: AC

## 2014-12-13 MED ORDER — LEVALBUTEROL HCL 0.63 MG/3ML IN NEBU
0.6300 mg | INHALATION_SOLUTION | RESPIRATORY_TRACT | Status: DC | PRN
  Administered 2014-12-22: 0.63 mg via RESPIRATORY_TRACT
  Filled 2014-12-13: qty 3

## 2014-12-13 MED ORDER — FUROSEMIDE 10 MG/ML IJ SOLN
40.0000 mg | Freq: Two times a day (BID) | INTRAMUSCULAR | Status: DC
  Administered 2014-12-13 – 2014-12-15 (×4): 40 mg via INTRAVENOUS
  Filled 2014-12-13 (×6): qty 4

## 2014-12-13 MED ORDER — BUDESONIDE 0.5 MG/2ML IN SUSP
0.5000 mg | Freq: Two times a day (BID) | RESPIRATORY_TRACT | Status: DC
  Administered 2014-12-13 – 2014-12-29 (×32): 0.5 mg via RESPIRATORY_TRACT
  Filled 2014-12-13 (×8): qty 2
  Filled 2014-12-13: qty 4
  Filled 2014-12-13 (×21): qty 2
  Filled 2014-12-13: qty 4
  Filled 2014-12-13 (×13): qty 2

## 2014-12-13 MED ORDER — WARFARIN SODIUM 5 MG PO TABS
5.0000 mg | ORAL_TABLET | Freq: Once | ORAL | Status: AC
  Administered 2014-12-13: 5 mg via ORAL
  Filled 2014-12-13: qty 1

## 2014-12-13 MED ORDER — FUROSEMIDE 40 MG PO TABS: 40.0000 mg | ORAL_TABLET | Freq: Two times a day (BID) | ORAL | Status: DC

## 2014-12-13 NOTE — Progress Notes (Signed)
[facility] PROGRESS NOTE  MY RINKE GMW:[id number] DOB: 1931/08/04 DOA: 12/04/2014 PCP: Kerby Nora, MD  Interim summary 81 y.o. female with a past medical history of pulmonary fibrosis/interstitial pneumonitis, diastolic congestive heart failure, chronic hypoxemic respiratory failure, atrial fibrillation on chronic anticoagulation who was recently admitted to the pulmonary critical care medicine service on 11/16/2014 discharged on 11/24/2014 to acute rehabilitation. During the hospitalization she initially presented with acute on chronic respiratory failure with hypoxemia which was believed to be secondary to acute flare up of end-stage interstitial lung disease. She was treated with IV steroids and broad-spectrum IV antimicrobial therapy. He was found to have a parainfluenza viral infection. She showed slow clinical improvement however becoming debilitated from prolonged hospitalization. She was discharged to inpatient rehabilitation on 11/24/2014. Readmitted on 12/04/14 due to sepsis from UTI and A. Fib with RVR. Course complicated with acute on chronic diastolic heart failure and pulmonary edema. Patient Being diuresed currently. PCCM and cardiology on board.  Assessment/Plan: 1. Sepsis due to UTI -Present on admission, evidenced by a white count of 20,000 thousand 500, respiratory 27, heart rate 150s, lactic acid of 4.15. -Source of infection could be urinary tract infection as u/a showed presence of pyuria and bacteria. -Patient showing clinical improvement with a downward trend in her white count to 10,000 from 22,000 on admission. She is afebrile and w/o dysuria -She is currently hemodynamically stable and afebrile; will monitor clinical response -Continue antibiotic therapy now adjusted base on sensitivity and transition to Oral regimen and just 2 more days pending   2.  Atrial fibrillation with rapid ventricular response. -Patient presented with A. fib with RVR having  ventricular rates in the 140s to 150s. -She was started on a Cardizem drip currently having heart rates in the 90-110's -I suspect underlying infectious process and advanced lung disease precipitating A. fib with RVR -Heart rates better controlled now, but still elevated. Will continue cardizem to  daily and TID metoprolol (dose adjusted on 7/31); EP consulted by cardiology. Will follow further rec's -CHADVASC score 5  3.  End-stage interstitial lung disease. -Patient having acute on chronic hypoxemic respiratory failure on admission with an increase in her oxygen requirements having a respiratory rate of 27. -Suspect patient have an acute flareup of interstitial lung disease -Chest x-ray repeated on 12/13/2014 demonstrated pulmonary edema; no infiltrates  -Will continue tapering steroids slowly -velcro signs appreciated on exam; no wheezing and patient with SOB due to heart failure -started on IV lasix -due to tachypnea and abd breathing PCCM consulted.  4.  Chronic anticoagulation. -Pharmacy was consulted for Coumadin management -INR to be adjusted by pharmacy  5.  Acute on Chronic diastolic congestive heart failure -Transthoracic echocardiogram performed on 11/17/2014 showed preserved ejection fraction. -most likely due to A. fib -Will use IV lasix BID and PRN BIPAP for SOB -close follow up of renal function and electrolytes -EP on board and helping controlling A. fib  6.  Proteus mirabilis and klebsiella Pneumoniae Urinary tract infection -Suspect may be cause of sepsis -following sensitivity will continue narrowed antibiotics; patient on ceftin now (final day of Abx's 8/3) -no fever and no dysuria   7.  Hypokalemia. -potassium has been repleted and patient now started on daily maintenance potassium -will follow electrolytes trend   8. Ginette Pitman  -will continue patient on nystatin and magic mouthwash -improving/resolving  Code Status: Full code Family Communication: I  spoke with her son last night Disposition Plan: home with home health therapy (patient refusing SNF)  Consultation:  Cardiology   PCCM  EP  Antibiotics:  Vancomycin 7/24>>7/27  Elita Quick 7/24>>7/27  Rocephin 7/27>>7/32  Ceftin 7/31>>8/3  HPI/Subjective: Patient w/o fever or CP. Patient complaining of increased SOB and with desaturation with minimal exertion.   Objective: Filed Vitals:   12/13/14 1620  BP:   Pulse: 105  Temp:   Resp: 16    Intake/Output Summary (Last 24 hours) at 12/13/14 1715 Last data filed at 12/13/14 1328  Gross per 24 hour  Intake 409.55 ml  Output   1200 ml  Net -790.45 ml   Filed Weights   12/11/14 0401 12/12/14 0533 12/13/14 0559  Weight: 127.7 kg (281 lb 8.4 oz) 126.5 kg (278 lb 14.1 oz) 127.5 kg (281 lb 1.4 oz)    Exam:   General:  Feeling tire and weak. Increase SOB and desaturation while transferring to bed-side commode. Patient with tachypnea and abdominal breathing.Awake, alert and oriented X 3. Continue to be on atrial fibrillation.  Cardiovascular: Irregular rate and rhythm, tachycardic, having bilateral 2+ lower extremity pitting edema (unchanged from baseline according to patient)  Respiratory:  positive crackles bilaterally, few rhonchi, on supplemental oxygen, tachypeneic and with abdominal breathing.  Abdomen: Obese, soft nontender nondistended  Musculoskeletal: 2+ bilateral extremity pitting edema  Data Reviewed: Basic Metabolic Panel:  Recent Labs Lab 12/07/14 0355  12/08/14 0228 12/10/14 0243 12/11/14 0239 12/12/14 0528 12/13/14 0619  NA 137  --  137 137 137 138 138  K 2.7*  < > 3.4* 4.1 4.4 4.7 4.4  CL 89*  --  87* 90* 92* 98* 94*  CO2 37*  --  39* 36* 36* 35* 32  GLUCOSE 164*  --  152* 140* 131* 108* 92  BUN 69*  --  63* 49* 45* 42* 44*  CREATININE 1.40*  --  1.43* 1.27* 1.24* 1.27* 1.38*  CALCIUM 9.1  --  9.0 8.8* 8.9 9.2 9.1  MG 2.1  --   --  2.1  --   --   --   < > = values in this interval not  displayed. CBC:  Recent Labs Lab 12/07/14 0220 12/08/14 0228 12/11/14 0239 12/13/14 0619  WBC 21.3* 20.6* 15.3* 10.1  HGB 14.5 14.8 13.5 14.4  HCT 43.5 45.1 41.9 44.1  MCV 98.6 100.2* 101.9* 103.5*  PLT 161 141* 90* 92*   BNP (last 3 results)  Recent Labs  10/14/14 0420 11/16/14 1851 12/04/14 1423  BNP 301.7* 160.9* 280.1*    ProBNP (last 3 results)  Recent Labs  02/22/14 0947 02/24/14 0332 03/07/14 0648  PROBNP 2319.0* 2410.0* 437.5    CBG: No results for input(s): GLUCAP in the last 168 hours.  Recent Results (from the past 240 hour(s))  Blood culture (routine x 2)     Status: None   Collection Time: 12/04/14  2:09 PM  Result Value Ref Range Status   Specimen Description BLOOD LEFT HAND  Final   Special Requests BOTTLES DRAWN AEROBIC AND ANAEROBIC 5CC  Final   Culture NO GROWTH 5 DAYS  Final   Report Status 12/09/2014 FINAL  Final  Urine C&S     Status: None   Collection Time: 12/04/14  2:17 PM  Result Value Ref Range Status   Specimen Description URINE, CLEAN CATCH  Final   Special Requests NONE  Final   Culture   Final    >=100,000 COLONIES/mL PROTEUS MIRABILIS 30,000 COLONIES/mL KLEBSIELLA PNEUMONIAE    Report Status 12/08/2014 FINAL  Final   Organism ID, Bacteria PROTEUS MIRABILIS  Final   Organism ID, Bacteria KLEBSIELLA PNEUMONIAE  Final      Susceptibility   Klebsiella pneumoniae - MIC*    AMPICILLIN >=32 RESISTANT Resistant     CEFAZOLIN <=4 SENSITIVE Sensitive     CEFTRIAXONE <=1 SENSITIVE Sensitive     CIPROFLOXACIN <=0.25 SENSITIVE Sensitive     GENTAMICIN <=1 SENSITIVE Sensitive     IMIPENEM <=0.25 SENSITIVE Sensitive     NITROFURANTOIN 64 INTERMEDIATE Intermediate     TRIMETH/SULFA <=20 SENSITIVE Sensitive     AMPICILLIN/SULBACTAM 8 SENSITIVE Sensitive     PIP/TAZO <=4 SENSITIVE Sensitive     * 30,000 COLONIES/mL KLEBSIELLA PNEUMONIAE   Proteus mirabilis - MIC*    AMPICILLIN <=2 SENSITIVE Sensitive     CEFAZOLIN 8 SENSITIVE  Sensitive     CEFTRIAXONE <=1 SENSITIVE Sensitive     CIPROFLOXACIN <=0.25 SENSITIVE Sensitive     GENTAMICIN <=1 SENSITIVE Sensitive     IMIPENEM 4 SENSITIVE Sensitive     NITROFURANTOIN 256 RESISTANT Resistant     TRIMETH/SULFA <=20 SENSITIVE Sensitive     AMPICILLIN/SULBACTAM <=2 SENSITIVE Sensitive     PIP/TAZO <=4 SENSITIVE Sensitive     * >=100,000 COLONIES/mL PROTEUS MIRABILIS  Blood culture (routine x 2)     Status: None   Collection Time: 12/04/14  3:02 PM  Result Value Ref Range Status   Specimen Description BLOOD RIGHT ANTECUBITAL  Final   Special Requests BOTTLES DRAWN AEROBIC AND ANAEROBIC 5CC  Final   Culture NO GROWTH 5 DAYS  Final   Report Status 12/09/2014 FINAL  Final  MRSA PCR Screening     Status: Abnormal   Collection Time: 12/04/14  4:45 PM  Result Value Ref Range Status   MRSA by PCR POSITIVE (A) NEGATIVE Final    Comment:        The GeneXpert MRSA Assay (FDA approved for NASAL specimens only), is one component of a comprehensive MRSA colonization surveillance program. It is not intended to diagnose MRSA infection nor to guide or monitor treatment for MRSA infections. CRITICAL RESULT CALLED TO, READ BACK BY AND VERIFIED WITH: M.NORMENT,RN 12/04/14 @2010  BY V.WILKINS      Studies: Dg Chest Port 1 View  12/13/2014   CLINICAL DATA:  Shortness of Breath  EXAM: PORTABLE CHEST - 1 VIEW  COMPARISON:  December 05, 2014  FINDINGS: There is stable cardiomegaly with mild interstitial edema. No airspace consolidation. There is a questionable small left effusion. No adenopathy. There is atherosclerotic change in the aorta. No bone lesions.  IMPRESSION: There is persistent congestive heart failure. No frank airspace consolidation. No new opacity appreciable.   Electronically Signed   By: Bretta Bang III M.D.   On: 12/13/2014 17:01    Scheduled Meds: . allopurinol  300 mg Oral Daily  . atorvastatin  10 mg Oral Daily  . budesonide  0.25 mg Nebulization BID  .  cefUROXime  500 mg Oral BID WC  . diltiazem  360 mg Oral Daily  . escitalopram  20 mg Oral Daily  . furosemide  40 mg Intravenous BID  . gabapentin  300 mg Oral Daily  . metoprolol tartrate  12.5 mg Oral BID  . nystatin  5 mL Oral QID  . potassium chloride  40 mEq Oral Daily  . predniSONE  30 mg Oral Q breakfast  . sodium chloride  3 mL Intravenous Q12H  . sodium chloride  3 mL Intravenous Q12H  . warfarin  5 mg Oral ONCE-1800  . Warfarin - Pharmacist  Dosing Inpatient   Does not apply q1800   Continuous Infusions: . heparin 1,200 Units/hr (12/13/14 1708)    Principal Problem:   Sepsis Active Problems:   CKD (chronic kidney disease) stage 3, GFR 30-59 ml/min   Interstitial lung disease   Debilitated   UTI (lower urinary tract infection)   Atrial fibrillation with RVR   FTT (failure to thrive) in adult   Blood poisoning   Weakness    Time spent: 40 minutes (> 50% on face to face examination, coordination of care and discussing with specialist about patient condition)    Vassie Loll  [facility] Pager [phone]. If 7PM-7AM, please contact night-coverage at [email or web address], password [facility] 12/13/2014, 5:15 PM  LOS: 9 days

## 2014-12-13 NOTE — Progress Notes (Addendum)
ANTICOAGULATION CONSULT NOTE - Follow Up Consult  Pharmacy Consult for Coumadin and heparin Indication: atrial fibrillation and hx DVT  Allergies  Allergen Reactions  . Pirfenidone Other (See Comments)    Congestion, breathing diffulty    Patient Measurements: Height: 5' 4" (162.6 cm) Weight: 281 lb 1.4 oz (127.5 kg) IBW/kg (Calculated) : 54.7  Heparin Dosing Weight: 85kg  Vital Signs: Temp: 97.8 F (36.6 C) (08/02 0559) Temp Source: Oral (08/02 0559) BP: 118/66 mmHg (08/02 0559) Pulse Rate: 90 (08/02 0559)  Labs:  Recent Labs  12/11/14 0239 12/12/14 0528 12/13/14 0619  HGB 13.5  --  14.4  HCT 41.9  --  44.1  PLT 90*  --  92*  LABPROT 22.4* 19.1* 18.8*  INR 1.98* 1.61* 1.57*  HEPARINUNFRC  --   --  0.31  CREATININE 1.24* 1.27* 1.38*    Estimated Creatinine Clearance: 42.3 mL/min (by C-G formula based on Cr of 1.38).  Assessment: 83yof admitted 12/04/2014 for AFib with RVR, encephalopathy, and met sepsis criteria on warfarin for hx Afib & DVT. Patient with history of end-stage pulmonary fibrosis, chronic diastolic congestive heart failure, chronic hypoxemic respiratory failure, atrial fibrillation   Admission INR therapeutic. Patient did receive 3 days of fluconazole which increased INR rapidly, so lower warfarin doses given on 7/29 and 7/30. Now that fluconazole is likely out of patient's system, INR has dropped d/t lower doses. INR today 1.57 PTA warfarin dose 2.33m daily  Heparin was started last evening as a bridge since INR is below goal. Level this morning is therapeutic, but on low end of range at 0.31 units/mL.  Hgb stable at 14.4. Platelets on admission were 185, however yesterday they dropped to 90 and today are relatively unchanged at 92. No overt bleeding noted. Drop began prior to start of heparin, so HIT not suspected at this time.  Goal of Therapy:  INR 2-3 Heparin level 0.3-0.7 units/ml Monitor platelets by anticoagulation protocol: Yes   Plan:   -increase heparin to 1200 units/hr to ensure patient stays therapeutic- recheck level in 8 hours -warfarin 512mpo x1 tonight -daily INR, HL and CBC -follow for s/s bleeding  Lauren D. Bajbus, PharmD, BCPS Clinical Pharmacist Pager: [phone]/06/2014 8:25 AM   Addendum -HL slightly subtherapeutic -Increase heparin to 1400 units/hr -F/u HL with am labs -Monitor s/sx bleeding   MaHarvel Quale8/06/2014 6:32 PM

## 2014-12-13 NOTE — Progress Notes (Signed)
Upon returning from lunch patient on NRB. Patient had gotten up to use BSC and desaturated into the 60's on her 5L [place]. HR from 27 to 120's. O2 sat 99% on NRB. Respirations 30. Dr. Gwenlyn Perking on floor and aware will be coming to see patient. Conitinuing to monitor.

## 2014-12-13 NOTE — Progress Notes (Signed)
Second attempt to call report to [facility] for patient to transfer. Patient placed on BIPAP (home settings). RT at bedside.

## 2014-12-13 NOTE — Progress Notes (Signed)
Physical Therapy Treatment Patient Details Name: [name] [name] MRN: [id number] DOB: 08/05/1931 Today's Date: 12/13/2014    History of Present Illness Patient is an 81 year old female with a past medical history of end-stage pulmonary fibrosis, chronic diastolic congestive heart failure, chronic hypoxemic respiratory failure, atrial fibrillation who was admitted to the medicine service on 12/04/2014 when she presented with complaints of a generalized weakness and functional decline. She had a recent hospitalization for acute on chronic hypoxemic respiratory failure believed to be secondary to acute flareup of end-stage interstitial lung disease and viral infection. She was discharged to inpatient rehabilitation. She was discharged from inpatient rehabilitation on 11/24/2014 and had been doing poorly at home. She was found to be in atrial fibrillation rapid ventricular response, encephalopathic, having sepsis.  Dx wth UTI.     PT Comments    Pt's HR is high and DOE is high with just basic transfer from chair to [facility] to bed today. HR started in low 90s and with initial transfer increased to 130s-150s and she is still in A-fib.  DOE 4/4 on 5 L O2NC and O2 sats low 90s during mobility.  She had significantly less tolerance of any mobility today.  She cannot go home in this state and I am recommending SNF placement. If she is converted out of rapid rate A-fib and she returns to short household distance ambulation then I would reconsider recommending HHPT at discharge.   Follow Up Recommendations  SNF;Other (comment) (pt will likely refuse SNF, but I dont think home is safe)     Equipment Recommendations  None recommended by PT    Recommendations for Other Services OT consult     Precautions / Restrictions Precautions Precautions: Fall Precaution Comments: monitor O2 sats and HR    Mobility  Bed Mobility Overal bed mobility: Needs Assistance Bed Mobility: Sit to Supine       Sit to supine:  Mod assist   General bed mobility comments: Mod assist to help bil legs back into bed from sitting.  Pt uses gravity and momentum to get trunk back into bed.   Transfers Overall transfer level: Needs assistance Equipment used: Rolling walker (2 wheeled) Transfers: Sit to/from Stand Sit to Stand: Min guard         General transfer comment: Min guard assist for safety.  Pt with heavy reliance on upper extremity support during transitions and has uncontrolled "plop" descent to sit today.   Ambulation/Gait Ambulation/Gait assistance: Min guard Ambulation Distance (Feet): 3 Feet Assistive device: Rolling walker (2 wheeled) Gait Pattern/deviations: Step-to pattern;Shuffle;Trunk flexed     General Gait Details: Pt only went from chair a few feet to [facility] and from [facility] a few feet back into bed.  She was dragging her feet and flexed at the trunk more today than yesterday and would prematurely reach for her next sitting surface due to activity intolerance.  HR 130s-150s once on her feet started in the 90s at rest.  DOE 4/4 during all mobility today.            Balance Overall balance assessment: Needs assistance         Standing balance support: Bilateral upper extremity supported Standing balance-Leahy Scale: Poor                      Cognition Arousal/Alertness: Awake/alert Behavior During Therapy: WFL for tasks assessed/performed Overall Cognitive Status: Within Functional Limits for tasks assessed  Pertinent Vitals/Pain Pain Assessment: No/denies pain           PT Goals (current goals can now be found in the care plan section) Acute Rehab PT Goals Patient Stated Goal: to get stronger so i dont have to be a burden to my kids Progress towards PT goals: Not progressing toward goals - comment (limited by high HR and activity intolerance)    Frequency  Min 3X/week    PT Plan Current plan remains appropriate       End of  Session Equipment Utilized During Treatment: Oxygen Activity Tolerance: Patient limited by fatigue;Treatment limited secondary to medical complications (Comment) (high HR and increased DOE with very little mobility) Patient left: in bed;with call bell/phone within reach     Time: 1001-1025 PT Time Calculation (min) (ACUTE ONLY): 24 min  Charges:  $Therapeutic Activity: 23-37 mins                      Nicolis Boody B. Ruthella Kirchman, PT, DPT [phone]   12/13/2014, 1:47 PM

## 2014-12-13 NOTE — Progress Notes (Signed)
Patient was moved from 5w to 2C on BiPAP and had no complications.  Patient seems comfortable and states she is not in distress.  RT will continue to monitor  Patient.

## 2014-12-13 NOTE — Consult Note (Signed)
Name: [name] [name] MRN: [id number] DOB: 02-22-1932    ADMISSION DATE:  12/04/2014 CONSULTATION DATE:  12/13/2014  REFERRING MD :  Gwenlyn Perking  CHIEF COMPLAINT:  Dyspnea  BRIEF PATIENT DESCRIPTION: 81 y.o. F with end stage ILD (followed by Dr. Kendrick Fries) who was recently admitted by [facility] for ILD flare and parainfluenza.  She was discharged to CIR but then re-admitted by [facility] for urosepsis, AFRVR and AoC dCHF.  On 8/2, she had drop in O2 sats and increased WOB so PCCM consulted for further recs.  SIGNIFICANT EVENTS  7/6/-7/14 - admitted with ILD flare and parainfluenza. 7/6 - discharged to CIR. 7/24 - re-admitted with urosepsis, AFRVR and AoC dCHF. 8/6 - had drop in SpO2 and increase in WOB after using bedside commode, transferred to SDU and PCCM consulted.  STUDIES:  CXR 8/2 >>> persistent CHF, no acute process. Echo 7/7 >>>  EF 55-60%, PAP 55.   HISTORY OF PRESENT ILLNESS:  [name] [name] is a 81 y.o. F with PMH as outlined below including end stage ILD, followed by Dr. Kendrick Fries.  She was initially admitted 11/16/14 through 11/24/14 by PCCM for AoC hypoxemic respiratory failure due to ILD flare as well as parainfluenza viral infection.  She was discharged to [facility] and on 7/24, was re-admitted to [facility] by [facility] for urosepsis and A.Fib with RVR.  Her course was c/b AoC dCHF and pulmonary edema.  She was treated with abx, steroids, cardizem, lasix, BiPAP.  She improved and was progressing well; however, on afternoon of 12/13/14, pt got up to use the bed side commode and upon returning to bed, her SpO2 had dropped to the 60's despite 5L O2 via [place].  HR increased from 27 to 120's. She was placed back on BiPAP and had some relief; however, staff concerned given that she had mild paradoxical respirations.  She was transferred to SDU and PCCM was consulted for further recs.  During our exam, she states BiPAP has significantly helped and she does not subjectively feel SOB.  She is comfortable at rest and feels that she  wants to take a nap.  She denies fevers/chills/sweats, chest pain, SOB, cough, N/V/D, abd pain.  She does have bilateral LE edema that she feels is worse than usual.  PAST MEDICAL HISTORY :   has a past medical history of Chronic diastolic CHF (congestive heart failure); Chronic atrial fibrillation; Personal history of DVT (deep vein thrombosis) (2008); HTN (hypertension); Morbid obesity; Shingles; GERD (gastroesophageal reflux disease); Hyperlipidemia; Meningitis (~ 1960); Gallstones; Non-obstructive CAD; PE (pulmonary embolism) (2008); Lung interstitial disease; Hypoxemia; COPD (chronic obstructive pulmonary disease); On home oxygen therapy; OSA treated with BiPAP; AAA (abdominal aortic aneurysm); History of blood transfusion; Iron deficiency anemia; Gout; Depression; and Chronic kidney disease (CKD), stage II (mild).  has past surgical history that includes Appendectomy (1947); Tonsillectomy (1946); Cardioversion (01/2007); right heart catheterization (N/A, 02/28/2014); Cardiac catheterization (02/2014); and Cataract extraction w/ intraocular lens  implant, bilateral (Bilateral). Prior to Admission medications   Medication Sig Start Date End Date Taking? Authorizing Provider  albuterol (PROVENTIL) (2.5 MG/3ML) 0.083% nebulizer solution Take 3 mLs (2.5 mg total) by nebulization every 2 (two) hours as needed for wheezing or shortness of breath. 10/20/14  Yes Richarda Overlie, MD  allopurinol (ZYLOPRIM) 300 MG tablet Take 1 tablet (300 mg total) by mouth daily. 12/02/14  Yes Daniel J Angiulli, PA-C  Alum & Mag Hydroxide-Simeth (MAGIC MOUTHWASH W/LIDOCAINE) SOLN Take 15 mLs by mouth 3 (three) times daily as needed for mouth pain. 12/02/14  Yes Mcarthur Rossetti Angiulli, PA-C  aspirin 81 MG tablet Take 81 mg by mouth daily.   Yes Historical Provider, MD  budesonide (PULMICORT) 0.25 MG/2ML nebulizer solution Take 0.25 mg by nebulization 2 (two) times daily.   Yes Historical Provider, MD  cetirizine (ZYRTEC ALLERGY) 10 MG  tablet Take 10 mg by mouth daily.    Yes Historical Provider, MD  diltiazem (CARDIZEM CD) 240 MG 24 hr capsule Take 1 capsule (240 mg total) by mouth daily. 12/02/14  Yes Daniel J Angiulli, PA-C  escitalopram (LEXAPRO) 20 MG tablet Take 1 tablet (20 mg total) by mouth daily. 12/02/14  Yes Daniel J Angiulli, PA-C  famotidine (PEPCID) 20 MG tablet Take 1 tablet (20 mg total) by mouth daily. 12/02/14  Yes Daniel J Angiulli, PA-C  ferrous sulfate 325 (65 FE) MG tablet Take 1 tablet (325 mg total) by mouth 2 (two) times daily with a meal. 12/02/14  Yes Daniel J Angiulli, PA-C  furosemide (LASIX) 40 MG tablet Take 1 tablet (40 mg total) by mouth 2 (two) times daily. 12/02/14  Yes Daniel J Angiulli, PA-C  gabapentin (NEURONTIN) 300 MG capsule Take 1 capsule (300 mg total) by mouth daily. 12/02/14  Yes Daniel J Angiulli, PA-C  metolazone (ZAROXOLYN) 5 MG tablet Take 1 tablet (5 mg total) by mouth daily. 12/02/14  Yes Daniel J Angiulli, PA-C  Multiple Vitamins-Minerals (MULTIVITAMIN & MINERAL PO) Take 1 tablet by mouth daily.   Yes Historical Provider, MD  potassium chloride SA (K-DUR,KLOR-CON) 20 MEQ tablet Take 2 tablets (40 mEq total) by mouth 2 (two) times daily. 12/02/14  Yes Daniel J Angiulli, PA-C  predniSONE (DELTASONE) 5 MG tablet 8 tablets daily 1 week then 3 tablets daily 1 month then 2 tablets daily 1 month then 1 tablet daily 1 month and stop 12/02/14  Yes Daniel J Angiulli, PA-C  warfarin (COUMADIN) 2.5 MG tablet Take 1 tablet (2.5 mg total) by mouth daily at 6 PM. 12/02/14  Yes Daniel J Angiulli, PA-C  atorvastatin (LIPITOR) 10 MG tablet Take 1 tablet (10 mg total) by mouth daily. 12/13/14   Excell Seltzer, MD   Allergies  Allergen Reactions  . Pirfenidone Other (See Comments)    Congestion, breathing diffulty    FAMILY HISTORY:  family history includes Depression in her mother; Heart disease in her paternal grandfather; Heart failure in her mother; Obesity in her other; Ovarian cancer in her  paternal grandmother. SOCIAL HISTORY:  reports that she has never smoked. She has never used smokeless tobacco. She reports that she drinks alcohol. She reports that she does not use illicit drugs.  REVIEW OF SYSTEMS:   All negative; except for those that are bolded, which indicate positives.  Constitutional: weight loss, weight gain, night sweats, fevers, chills, fatigue, weakness.  HEENT: headaches, sore throat, sneezing, nasal congestion, post nasal drip, difficulty swallowing, tooth/dental problems, visual complaints, visual changes, ear aches. Neuro: difficulty with speech, weakness, numbness, ataxia. CV:  chest pain, orthopnea, PND, swelling in lower extremities, dizziness, palpitations, syncope.  Resp: cough, hemoptysis, dyspnea - improving, wheezing. GI  heartburn, indigestion, abdominal pain, nausea, vomiting, diarrhea, constipation, change in bowel habits, loss of appetite, hematemesis, melena, hematochezia.  GU: dysuria, change in color of urine, urgency or frequency, flank pain, hematuria. MSK: joint pain or swelling, decreased range of motion. Psych: change in mood or affect, depression, anxiety, suicidal ideations, homicidal ideations. Skin: rash, itching, bruising.   SUBJECTIVE:  Denies fevers/chills/sweats, chest pain, SOB, cough. Feels comfortable on BiPAP.  VITAL  SIGNS: Temp:  [97.8 F (36.6 C)-98.6 F (37 C)] 98.6 F (37 C) (08/02 1325) Pulse Rate:  [89-157] 105 (08/02 1620) Resp:  [16-30] 16 (08/02 1620) BP: (106-125)/(52-66) 125/63 mmHg (08/02 1325) SpO2:  [90 %-97 %] 94 % (08/02 1620) Weight:  [127.5 kg (281 lb 1.4 oz)] 127.5 kg (281 lb 1.4 oz) (08/02 0559)  PHYSICAL EXAMINATION: General: Obese female, resting in bed, in NAD. Neuro: A&O x 3, non-focal.  HEENT: [place]/AT. PERRL, sclerae anicteric. BiPAP in place. Cardiovascular: RRR, no M/R/G.  Lungs: Respirations shallow and unlabored.  BS distant with faint bibasilar crackles and occasional wheeze. No accessory  muscle use; however, mild paradoxical respirations. Abdomen: Obese, BS x 4, soft, NT/ND.  Musculoskeletal: No gross deformities, 2+ peripheral pitting edema.  Skin: Intact, warm, no rashes.    Recent Labs Lab 12/11/14 0239 12/12/14 0528 12/13/14 0619  NA 137 138 138  K 4.4 4.7 4.4  CL 92* 98* 94*  CO2 36* 35* 32  BUN 45* 42* 44*  CREATININE 1.24* 1.27* 1.38*  GLUCOSE 131* 108* 92    Recent Labs Lab 12/08/14 0228 12/11/14 0239 12/13/14 0619  HGB 14.8 13.5 14.4  HCT 45.1 41.9 44.1  WBC 20.6* 15.3* 10.1  PLT 141* 90* 92*   Dg Chest Port 1 View  12/13/2014   CLINICAL DATA:  Shortness of Breath  EXAM: PORTABLE CHEST - 1 VIEW  COMPARISON:  December 05, 2014  FINDINGS: There is stable cardiomegaly with mild interstitial edema. No airspace consolidation. There is a questionable small left effusion. No adenopathy. There is atherosclerotic change in the aorta. No bone lesions.  IMPRESSION: There is persistent congestive heart failure. No frank airspace consolidation. No new opacity appreciable.   Electronically Signed   By: Bretta Bang III M.D.   On: 12/13/2014 17:01    ASSESSMENT / PLAN:  Acute on chronic hypoxemic respiratory failure. End stage ILD. Pulmonary edema. OSA - uses nocturnal BiPAP as outpatient (she states was previously on CPAP but has since been switched). PAH (PAP 55 by echo from 11/17/14). Plan: Continuous BiPAP for now - 4 hours on, 1 hour off. Continue supplemental O2 as needed to maintain SpO2 > 90%. Continue steroids (don't think any role in increasing steroid dose as her worsening respiratory status is likely due to pulmonary edema / CHF vs sudden ILD flare), budesonide (dose adjusted), levalbuterol. Agree with aggressive diuresis as able. Incentive spirometry. CXR in AM.  A.fib with RVR - resolving. AoC dCHF - last echo from 11/17/14 with EF 55 - 60%. Plan: Continue cardizem, metoprolol, heparin with transition back to warfarin. Aggressive diuresis as  able. Consider adding metolazone to follow her outpatient regimen.  Rest per Primary.   Rutherford Guys, [place] [facility] [place] [facility] Pager: [phone]  or [phone] 12/13/2014, 6:28 PM

## 2014-12-13 NOTE — Progress Notes (Signed)
Patient was abdominal breathing and lethargic. Patient would arouse when spoken to and did not seem confused.  Xray tech came to room to do CXR and seemed concerned with patient's breathing as well.  RN was notified by RT and CCM doctors came to evaluate patient. Patient still did not vocalize she was feeling SOB, but breathing still appeared to be labored and abdominal breathing was present. Patient is being moved to [facility] room 6. RT will continue to monitor.

## 2014-12-13 NOTE — Progress Notes (Signed)
Rt placed patient on BiPAP per MD. Patient seemed sleepy when RT came to room to place patient on BiPAP. Patient also requested that she wear the BiPAP. BiPAP settings are 14/7 with 5 L of O2. Patient states she is comfortable and wants to take a nap. Patient also stated that she is breathing fine and that she does not feel "any sleepier than usual". RT will continue to monitor patient.

## 2014-12-14 ENCOUNTER — Inpatient Hospital Stay (HOSPITAL_COMMUNITY): Payer: Medicare Other

## 2014-12-14 DIAGNOSIS — A419 Sepsis, unspecified organism: Secondary | ICD-10-CM

## 2014-12-14 LAB — CBC
HCT: 40.3 % (ref 36.0–46.0)
Hemoglobin: 13 g/dL (ref 12.0–15.0)
MCH: 32.7 pg (ref 26.0–34.0)
MCHC: 32.3 g/dL (ref 30.0–36.0)
MCV: 101.5 fL — ABNORMAL HIGH (ref 78.0–100.0)
PLATELETS: 104 10*3/uL — AB (ref 150–400)
RBC: 3.97 MIL/uL (ref 3.87–5.11)
RDW: 16.2 % — ABNORMAL HIGH (ref 11.5–15.5)
WBC: 9.9 10*3/uL (ref 4.0–10.5)

## 2014-12-14 LAB — HEPARIN LEVEL (UNFRACTIONATED)
Heparin Unfractionated: 0.64 IU/mL (ref 0.30–0.70)
Heparin Unfractionated: 0.77 IU/mL — ABNORMAL HIGH (ref 0.30–0.70)

## 2014-12-14 LAB — BASIC METABOLIC PANEL
Anion gap: 8 (ref 5–15)
BUN: 35 mg/dL — AB (ref 6–20)
CO2: 33 mmol/L — ABNORMAL HIGH (ref 22–32)
Calcium: 9 mg/dL (ref 8.9–10.3)
Chloride: 100 mmol/L — ABNORMAL LOW (ref 101–111)
Creatinine, Ser: 1.09 mg/dL — ABNORMAL HIGH (ref 0.44–1.00)
GFR calc Af Amer: 54 mL/min — ABNORMAL LOW (ref >60)
GFR, EST NON AFRICAN AMERICAN: 46 mL/min — AB (ref >60)
GLUCOSE: 110 mg/dL — AB (ref 65–99)
POTASSIUM: 4.9 mmol/L (ref 3.5–5.1)
Sodium: 141 mmol/L (ref 135–145)

## 2014-12-14 LAB — PROTIME-INR
INR: 1.64 — AB (ref 0.00–1.49)
Prothrombin Time: 19.4 seconds — ABNORMAL HIGH (ref 11.6–15.2)

## 2014-12-14 LAB — MAGNESIUM: Magnesium: 2.2 mg/dL (ref 1.7–2.4)

## 2014-12-14 LAB — PHOSPHORUS: Phosphorus: 3.4 mg/dL (ref 2.5–4.6)

## 2014-12-14 MED ORDER — DIGOXIN 250 MCG PO TABS
0.2500 mg | ORAL_TABLET | Freq: Every day | ORAL | Status: DC
  Administered 2014-12-15 – 2014-12-18 (×4): 0.25 mg via ORAL
  Filled 2014-12-14 (×5): qty 1

## 2014-12-14 MED ORDER — METOPROLOL TARTRATE 1 MG/ML IV SOLN
2.5000 mg | Freq: Once | INTRAVENOUS | Status: AC
  Administered 2014-12-14: 2.5 mg via INTRAVENOUS

## 2014-12-14 MED ORDER — METOPROLOL TARTRATE 25 MG PO TABS
25.0000 mg | ORAL_TABLET | Freq: Two times a day (BID) | ORAL | Status: DC
  Administered 2014-12-14 (×2): 25 mg via ORAL
  Filled 2014-12-14 (×4): qty 1

## 2014-12-14 MED ORDER — METOPROLOL TARTRATE 1 MG/ML IV SOLN
INTRAVENOUS | Status: AC
  Filled 2014-12-14: qty 5

## 2014-12-14 MED ORDER — DILTIAZEM HCL 25 MG/5ML IV SOLN
10.0000 mg | Freq: Once | INTRAVENOUS | Status: AC
  Administered 2014-12-14: 10 mg via INTRAVENOUS
  Filled 2014-12-14: qty 5

## 2014-12-14 MED ORDER — DILTIAZEM HCL ER COATED BEADS 240 MG PO CP24
240.0000 mg | ORAL_CAPSULE | Freq: Every day | ORAL | Status: DC
  Administered 2014-12-14 – 2014-12-15 (×2): 240 mg via ORAL
  Filled 2014-12-14 (×3): qty 1

## 2014-12-14 MED ORDER — WARFARIN SODIUM 5 MG PO TABS
5.0000 mg | ORAL_TABLET | Freq: Once | ORAL | Status: AC
Start: 2014-12-14 — End: 2014-12-14
  Administered 2014-12-14: 5 mg via ORAL
  Filled 2014-12-14: qty 1

## 2014-12-14 MED ORDER — DIGOXIN 0.25 MG/ML IJ SOLN
0.2500 mg | Freq: Four times a day (QID) | INTRAMUSCULAR | Status: AC
  Administered 2014-12-14 (×2): 0.25 mg via INTRAVENOUS
  Filled 2014-12-14 (×2): qty 1

## 2014-12-14 NOTE — Progress Notes (Signed)
Pt remains on BIPAP at this time. 

## 2014-12-14 NOTE — Progress Notes (Signed)
[facility] PROGRESS NOTE  [name] [name] ZHG:[id number] DOB: 05/05/1932 DOA: 12/04/2014 PCP: Kerby Nora, MD  Interim summary 81 y.o. female with a past medical history of pulmonary fibrosis/interstitial pneumonitis, diastolic congestive heart failure, chronic hypoxemic respiratory failure, atrial fibrillation on chronic anticoagulation who was recently admitted to the pulmonary critical care medicine service on 11/16/2014 discharged on 11/24/2014 to acute rehabilitation. During the hospitalization she initially presented with acute on chronic respiratory failure with hypoxemia which was believed to be secondary to acute flare up of end-stage interstitial lung disease. She was treated with IV steroids and broad-spectrum IV antimicrobial therapy. He was found to have a parainfluenza viral infection. She showed slow clinical improvement however becoming debilitated from prolonged hospitalization. She was discharged to inpatient rehabilitation on 11/24/2014. Readmitted on 12/04/14 due to sepsis from UTI and A. Fib with RVR. Course complicated with acute on chronic diastolic heart failure and pulmonary edema. Patient Being diuresed currently. PCCM and cardiology on board.  Assessment/Plan: 1. Sepsis due to UTI -Present on admission, evidenced by a white count of 20,000 thousand 500, respiratory 27, heart rate 150s, lactic acid of 4.15. -Source of infection could be urinary tract infection as u/a showed presence of pyuria and bacteria. -Patient showing clinical improvement with a downward trend in her white count to 10,000 from 22,000 on admission. She is afebrile and w/o dysuria -Continue antibiotic therapy now adjusted base on sensitivity and transition to Oral regimen and just 2 more days pending.   2.  Atrial fibrillation with rapid ventricular response. -I suspect underlying infectious process and advanced lung disease precipitating A. fib with RVR -continue cardizem to 360mg  daily and TID  metoprolol (dose adjusted on 7/31); EP consulted by cardiology. Will follow further rec's  -CHADVASC score 5 -Dr Ladona Ridgel will see patient and give recommendations.   3.  End-stage interstitial lung disease. -Patient having acute on chronic hypoxemic respiratory failure on admission with an increase in her oxygen requirements having a respiratory rate of 27.  -Chest x-ray repeated on 12/13/2014 demonstrated pulmonary edema; no infiltrates  - continue tapering steroids slowly -Continue with  IV lasix -PCCM consulted and following.   4.  Chronic anticoagulation. -Pharmacy was consulted for Coumadin management -INR to be adjusted by pharmacy -now heparin Gtt.   5.  Acute on Chronic diastolic congestive heart failure -Transthoracic echocardiogram performed on 11/17/2014 showed preserved ejection fraction. -most likely due to A. fib -IV lasix BID and PRN BIPAP for SOB -close follow up of renal function and electrolytes -EP on board and helping controlling A. fib -Cardiology following.   6.  Proteus mirabilis and klebsiella Pneumoniae Urinary tract infection -Suspect may be cause of sepsis -following sensitivity will continue narrowed antibiotics; patient on ceftin now (final day of Abx's 8/3) -no fever and no dysuria   7.  Hypokalemia. -potassium has been repleted and patient now started on daily maintenance potassium -will follow electrolytes trend   8. Ginette Pitman  -will continue patient on nystatin and magic mouthwash -improving/resolving  Code Status: Full code Family Communication: sons at bedside.  Disposition Plan: home with home health therapy (patient refusing SNF)  Consultation:  Cardiology   PCCM  EP  Antibiotics:  Vancomycin 7/24>>7/27  Elita Quick 7/24>>7/27  Rocephin 7/27>>7/32  Ceftin 7/31>>8/3  HPI/Subjective: She is feeling better than yesterday. Would like to eat.  No abdominal pain. Breathing better  Objective: Filed Vitals:   12/14/14 0800  BP:    Pulse: 103  Temp:   Resp: 23    Intake/Output  Summary (Last 24 hours) at 12/14/14 0928 Last data filed at 12/14/14 0800  Gross per 24 hour  Intake 590.65 ml  Output    600 ml  Net  -9.35 ml   Filed Weights   12/12/14 0533 12/13/14 0559 12/14/14 0500  Weight: 126.5 kg (278 lb 14.1 oz) 127.5 kg (281 lb 1.4 oz) 129 kg (284 lb 6.3 oz)    Exam:   General:  .Awake, alert and oriented X 3. On BIPAP.   Cardiovascular: Irregular rate and rhythm, tachycardic, having bilateral 2+ lower extremity pitting edema.   Respiratory:  positive crackles bilaterally, on BIPAP/   Abdomen: Obese, soft nontender nondistended  Musculoskeletal: 2+ bilateral extremity pitting edema  Data Reviewed: Basic Metabolic Panel:  Recent Labs Lab 12/10/14 0243 12/11/14 0239 12/12/14 0528 12/13/14 0619 12/14/14 0300  NA 137 137 138 138 141  K 4.1 4.4 4.7 4.4 4.9  CL 90* 92* 98* 94* 100*  CO2 36* 36* 35* 32 33*  GLUCOSE 140* 131* 108* 92 110*  BUN 49* 45* 42* 44* 35*  CREATININE 1.27* 1.24* 1.27* 1.38* 1.09*  CALCIUM 8.8* 8.9 9.2 9.1 9.0  MG 2.1  --   --   --  2.2  PHOS  --   --   --   --  3.4   CBC:  Recent Labs Lab 12/08/14 0228 12/11/14 0239 12/13/14 0619 12/14/14 0300  WBC 20.6* 15.3* 10.1 9.9  HGB 14.8 13.5 14.4 13.0  HCT 45.1 41.9 44.1 40.3  MCV 100.2* 101.9* 103.5* 101.5*  PLT 141* 90* 92* 104*   BNP (last 3 results)  Recent Labs  10/14/14 0420 11/16/14 1851 12/04/14 1423  BNP 301.7* 160.9* 280.1*    ProBNP (last 3 results)  Recent Labs  02/22/14 0947 02/24/14 0332 03/07/14 0648  PROBNP 2319.0* 2410.0* 437.5    CBG: No results for input(s): GLUCAP in the last 168 hours.  Recent Results (from the past 240 hour(s))  Blood culture (routine x 2)     Status: None   Collection Time: 12/04/14  2:09 PM  Result Value Ref Range Status   Specimen Description BLOOD LEFT HAND  Final   Special Requests BOTTLES DRAWN AEROBIC AND ANAEROBIC 5CC  Final   Culture NO GROWTH  5 DAYS  Final   Report Status 12/09/2014 FINAL  Final  Urine C&S     Status: None   Collection Time: 12/04/14  2:17 PM  Result Value Ref Range Status   Specimen Description URINE, CLEAN CATCH  Final   Special Requests NONE  Final   Culture   Final    >=100,000 COLONIES/mL PROTEUS MIRABILIS 30,000 COLONIES/mL KLEBSIELLA PNEUMONIAE    Report Status 12/08/2014 FINAL  Final   Organism ID, Bacteria PROTEUS MIRABILIS  Final   Organism ID, Bacteria KLEBSIELLA PNEUMONIAE  Final      Susceptibility   Klebsiella pneumoniae - MIC*    AMPICILLIN >=32 RESISTANT Resistant     CEFAZOLIN <=4 SENSITIVE Sensitive     CEFTRIAXONE <=1 SENSITIVE Sensitive     CIPROFLOXACIN <=0.25 SENSITIVE Sensitive     GENTAMICIN <=1 SENSITIVE Sensitive     IMIPENEM <=0.25 SENSITIVE Sensitive     NITROFURANTOIN 64 INTERMEDIATE Intermediate     TRIMETH/SULFA <=20 SENSITIVE Sensitive     AMPICILLIN/SULBACTAM 8 SENSITIVE Sensitive     PIP/TAZO <=4 SENSITIVE Sensitive     * 30,000 COLONIES/mL KLEBSIELLA PNEUMONIAE   Proteus mirabilis - MIC*    AMPICILLIN <=2 SENSITIVE Sensitive     CEFAZOLIN  8 SENSITIVE Sensitive     CEFTRIAXONE <=1 SENSITIVE Sensitive     CIPROFLOXACIN <=0.25 SENSITIVE Sensitive     GENTAMICIN <=1 SENSITIVE Sensitive     IMIPENEM 4 SENSITIVE Sensitive     NITROFURANTOIN 256 RESISTANT Resistant     TRIMETH/SULFA <=20 SENSITIVE Sensitive     AMPICILLIN/SULBACTAM <=2 SENSITIVE Sensitive     PIP/TAZO <=4 SENSITIVE Sensitive     * >=100,000 COLONIES/mL PROTEUS MIRABILIS  Blood culture (routine x 2)     Status: None   Collection Time: 12/04/14  3:02 PM  Result Value Ref Range Status   Specimen Description BLOOD RIGHT ANTECUBITAL  Final   Special Requests BOTTLES DRAWN AEROBIC AND ANAEROBIC 5CC  Final   Culture NO GROWTH 5 DAYS  Final   Report Status 12/09/2014 FINAL  Final  MRSA PCR Screening     Status: Abnormal   Collection Time: 12/04/14  4:45 PM  Result Value Ref Range Status   MRSA by PCR  POSITIVE (A) NEGATIVE Final    Comment:        The GeneXpert MRSA Assay (FDA approved for NASAL specimens only), is one component of a comprehensive MRSA colonization surveillance program. It is not intended to diagnose MRSA infection nor to guide or monitor treatment for MRSA infections. CRITICAL RESULT CALLED TO, READ BACK BY AND VERIFIED WITH: M.NORMENT,RN 12/04/14 @2010  BY V.WILKINS      Studies: Dg Chest Port 1 View  12/14/2014   CLINICAL DATA:  Respiratory failure.  EXAM: PORTABLE CHEST - 1 VIEW  COMPARISON:  12/13/2014.  FINDINGS: Cardiomegaly with pulmonary interstitial prominence and left pleural effusion again noted. Findings consistent with persistent congestive heart failure. Left base atelectasis and/or infiltrate. Bilateral pleural thickening. This is most consistent scarring. No acute bony abnormality.  IMPRESSION: 1. Persistent changes of persist congestive heart failure with pulmonary interstitial edema and left pleural effusion.  2. Left base atelectasis and/or infiltrate.   Electronically Signed   By: Maisie Fus  Register   On: 12/14/2014 06:59   Dg Chest Port 1 View  12/13/2014   CLINICAL DATA:  Shortness of Breath  EXAM: PORTABLE CHEST - 1 VIEW  COMPARISON:  December 05, 2014  FINDINGS: There is stable cardiomegaly with mild interstitial edema. No airspace consolidation. There is a questionable small left effusion. No adenopathy. There is atherosclerotic change in the aorta. No bone lesions.  IMPRESSION: There is persistent congestive heart failure. No frank airspace consolidation. No new opacity appreciable.   Electronically Signed   By: Bretta Bang III M.D.   On: 12/13/2014 17:01    Scheduled Meds: . allopurinol  300 mg Oral Daily  . atorvastatin  10 mg Oral Daily  . budesonide  0.5 mg Nebulization BID  . cefUROXime  500 mg Oral BID WC  . diltiazem  360 mg Oral Daily  . escitalopram  20 mg Oral Daily  . furosemide  40 mg Intravenous BID  . gabapentin  300 mg Oral  Daily  . metoprolol tartrate  12.5 mg Oral BID  . nystatin  5 mL Oral QID  . potassium chloride  40 mEq Oral Daily  . predniSONE  30 mg Oral Q breakfast  . sodium chloride  3 mL Intravenous Q12H  . sodium chloride  3 mL Intravenous Q12H  . Warfarin - Pharmacist Dosing Inpatient   Does not apply q1800   Continuous Infusions: . heparin 1,200 Units/hr (12/13/14 1708)    Principal Problem:   Sepsis Active Problems:   CKD (chronic  kidney disease) stage 3, GFR 30-59 ml/min   Interstitial lung disease   Debilitated   UTI (lower urinary tract infection)   Atrial fibrillation with RVR   FTT (failure to thrive) in adult   Blood poisoning   Weakness   Acute on chronic respiratory failure with hypoxia    Time spent: 40 minutes    Aerabella Galasso A  [facility] Pager [phone]. If 7PM-7AM, please contact night-coverage at [email or web address], password [facility] 12/14/2014, 9:28 AM  LOS: 10 days

## 2014-12-14 NOTE — Progress Notes (Addendum)
Name: [name] [name] MRN: [id number] DOB: 02-16-1932    ADMISSION DATE:  12/04/2014 CONSULTATION DATE:  12/14/2014  REFERRING MD :  Gwenlyn Perking  CHIEF COMPLAINT:  Dyspnea  BRIEF PATIENT DESCRIPTION: 81 y.o. F with end stage ILD (followed by Dr. Kendrick Fries) who was recently admitted by [facility] for ILD flare and parainfluenza.  She was discharged to CIR but then re-admitted by [facility] for urosepsis, AFRVR and AoC dCHF.  On 8/2, she had drop in O2 sats and increased WOB so PCCM consulted for further recs.  SIGNIFICANT EVENTS  7/6/-7/14 - admitted with ILD flare and parainfluenza. 7/6 - discharged to CIR. 7/24 - re-admitted with urosepsis, AFRVR and AoC dCHF. 8/6 - had drop in SpO2 and increase in WOB after using bedside commode, transferred to SDU and PCCM consulted.  STUDIES:  CXR 8/2 >>> persistent CHF, no acute process. Echo 7/7 >>>  EF 55-60%, PAP 55.  SUBJECTIVE:  No acute events overnight. Patient remained on BiPAP most of the night. Reports she is breathing well and denies any cough. Denies any chest pain or tightness. Denies any palpitations although she has had some rapid ventricular response from her atrial fibrillation.  ROS: No nausea, vomiting, or abdominal pain. No subjective fever, chills, or sweats.  VITAL SIGNS: Temp:  [97.5 F (36.4 C)-98.8 F (37.1 C)] 97.5 F (36.4 C) (08/03 0359) Pulse Rate:  [89-157] 116 (08/03 0359) Resp:  [16-30] 30 (08/03 0359) BP: (106-130)/(52-68) 130/67 mmHg (08/03 0359) SpO2:  [90 %-98 %] 96 % (08/03 0359) FiO2 (%):  [40 %] 40 % (08/02 2016) Weight:  [284 lb 6.3 oz (129 kg)] 284 lb 6.3 oz (129 kg) (08/03 0500)  PHYSICAL EXAMINATION: General:  Sleepy until awoken by me. Awake. Alert. No acute distress. Integument:  Warm & dry. No rash on exposed skin.  HEENT:  Moist mucus membranes. No oral ulcers. No scleral injection or icterus. BiPAP mask in place. Cardiovascular:  Tachycardic with irregular rhythm. Edema bilateral lower extremities. Unable to  appreciate JVD given body habitus.  Pulmonary:  Crackles bilateral lung bases unchanged. Speaking in complete sentences. Symmetric chest wall expansion. No accessory muscle use on BiPAP. Abdomen: Soft. Normal bowel sounds. Protuberant. Grossly nontender. Neurological:  CN 2-12 grossly in tact. No meningismus. Moving all 4 extremities equally.     Recent Labs Lab 12/12/14 0528 12/13/14 0619 12/14/14 0300  NA 138 138 141  K 4.7 4.4 4.9  CL 98* 94* 100*  CO2 35* 32 33*  BUN 42* 44* 35*  CREATININE 1.27* 1.38* 1.09*  GLUCOSE 108* 92 110*    Recent Labs Lab 12/11/14 0239 12/13/14 0619 12/14/14 0300  HGB 13.5 14.4 13.0  HCT 41.9 44.1 40.3  WBC 15.3* 10.1 9.9  PLT 90* 92* 104*   Dg Chest Port 1 View  12/13/2014   CLINICAL DATA:  Shortness of Breath  EXAM: PORTABLE CHEST - 1 VIEW  COMPARISON:  December 05, 2014  FINDINGS: There is stable cardiomegaly with mild interstitial edema. No airspace consolidation. There is a questionable small left effusion. No adenopathy. There is atherosclerotic change in the aorta. No bone lesions.  IMPRESSION: There is persistent congestive heart failure. No frank airspace consolidation. No new opacity appreciable.   Electronically Signed   By: Bretta Bang III M.D.   On: 12/13/2014 17:01    ASSESSMENT / PLAN: 1. Acute on chronic hypoxic respiratory failure: Patient continuing with Lasix IV diuresis. Continuing on BiPAP as needed for increased work of breathing. Titrate FiO2 to maintain saturation  greater than 94%. 2. Atrial fibrillation with RVR: Administered Lopressor 2.5 mg IV 1 this morning without significant response. I have also ordered diltiazem 10 mg IV 1. Patient continuing on diltiazem & oral Lopressor as previously ordered. Continue to monitor the patient on telemetry. Currently on heparin for systemic anticoagulation transitioning to Coumadin. 3. Acute on chronic diastolic congestive heart failure: Continuing Lasix diuresis. Continuing  Lipitor. Monitoring urine output along with daily electrolytes and renal function with BUN/creatinine. 4. Polymicrobial UTI: Currently on cefuroxime. 5. ILD: No signs or suggestion of acute exacerbation at this time. Patient continuing on prednisone 30 mg by mouth daily.  Donna Christen Jamison Neighbor, M.D. [facility] Pager:  [phone] After 3pm or if no response, call [phone]   12/14/2014, 6:58 AM

## 2014-12-14 NOTE — Progress Notes (Signed)
ANTICOAGULATION CONSULT NOTE - Follow Up Consult  Pharmacy Consult for heparin Indication: Afib and h/o DVT   Labs:  Recent Labs  12/12/14 0528 12/13/14 0619 12/13/14 1720 12/14/14 0300  HGB  --  14.4  --  13.0  HCT  --  44.1  --  40.3  PLT  --  92*  --  104*  LABPROT 19.1* 18.8*  --  19.4*  INR 1.61* 1.57*  --  1.64*  HEPARINUNFRC  --  0.31 0.29* 0.64  CREATININE 1.27* 1.38*  --  1.09*     Assessment/Plan:  81yo female therapeutic on heparin after rate change. Will continue gtt at current rate and confirm stable with additional level.   Vernard Gambles, PharmD, BCPS  12/14/2014,4:22 AM

## 2014-12-14 NOTE — Progress Notes (Addendum)
ANTICOAGULATION CONSULT NOTE - Follow Up Consult  Pharmacy Consult for Coumadin and heparin Indication: atrial fibrillation and hx DVT  Allergies  Allergen Reactions  . Pirfenidone Other (See Comments)    Congestion, breathing diffulty    Patient Measurements: Height: _0  (162.6 cm) Weight: 284 lb 6.3 oz (129 kg) IBW/kg (Calculated) : 54.7  Heparin Dosing Weight: 85kg  Vital Signs: Temp: 96.1 F (35.6 C) (08/03 1309) Temp Source: Axillary (08/03 1309) BP: 141/90 mmHg (08/03 1309) Pulse Rate: 79 (08/03 1309)  Labs:  Recent Labs  12/12/14 0528 12/13/14 0619 12/13/14 1720 12/14/14 0300  HGB  --  14.4  --  13.0  HCT  --  44.1  --  40.3  PLT  --  92*  --  104*  LABPROT 19.1* 18.8*  --  19.4*  INR 1.61* 1.57*  --  1.64*  HEPARINUNFRC  --  0.31 0.29* 0.64  CREATININE 1.27* 1.38*  --  1.09*    Estimated Creatinine Clearance: 53.9 mL/min (by C-G formula based on Cr of 1.09).  Assessment: [name] [name] admitted 12/04/2014 for AFib with RVR, encephalopathy, and met sepsis criteria on warfarin for hx Afib & DVT. INR remains subtherapeutic at 1.64. No bleeding noted. Of note, patient did receive 3 days of fluconazole which increased INR rapidly, so lower warfarin doses given on 7/29 and 7/30. Now that fluconazole is likely out of patient's system, INR has dropped.  Pt also continues on heparin bridge while INR is subtherapeutic. Level now elevated at 0.77.CBC is stable but platelets are low so will need to watch closely. No bleeding noted.   Goal of Therapy:  INR 2-3 Heparin level 0.3-0.7 units/ml Monitor platelets by anticoagulation protocol: Yes   Plan: - Reduce heparin gtt to 1300 units/hr - Check an 8 hour heparin level - Repeat warfarin 3m PO x 1 tonight - Daily INR, heparin level and CBC  RSalome Arnt PharmD, BCPS Pager # [phone]/07/2014 1:57 PM

## 2014-12-14 NOTE — Progress Notes (Signed)
Patient Profile: 81 year old morbidly obese woman with interstitial lung disease on chronic prednisone, recently readmitted with sepsis. Background history of chronic diastolic heart failure and chronic atrial fibrillation on warfarin.  Subjective: On Bipap. No pain. Son by bedside.   Objective: Vital signs in last 24 hours: Temp:  [97.5 F (36.4 C)-98.8 F (37.1 C)] 97.5 F (36.4 C) (08/03 0359) Pulse Rate:  [43-157] 103 (08/03 0800) Resp:  [16-30] 23 (08/03 0800) BP: (106-130)/(52-73) 122/60 mmHg (08/03 0700) SpO2:  [90 %-98 %] 93 % (08/03 0800) FiO2 (%):  [40 %] 40 % (08/03 0758) Weight:  [284 lb 6.3 oz (129 kg)] 284 lb 6.3 oz (129 kg) (08/03 0500) Last BM Date: 12/13/14  Intake/Output from previous day: 08/02 0701 - 08/03 0700 In: 578.7 [P.O.:240; I.V.:338.7] Out: 600 [Urine:600] Intake/Output this shift: Total I/O In: 12 [I.V.:12] Out: -   Medications Current Facility-Administered Medications  Medication Dose Route Frequency Provider Last Rate Last Dose  . 0.9 %  sodium chloride infusion  250 mL Intravenous PRN Jeralyn Bennett, MD      . acetaminophen (TYLENOL) tablet 650 mg  650 mg Oral Q6H PRN Jeralyn Bennett, MD   650 mg at 12/11/14 2100   Or  . acetaminophen (TYLENOL) suppository 650 mg  650 mg Rectal Q6H PRN Jeralyn Bennett, MD      . allopurinol (ZYLOPRIM) tablet 300 mg  300 mg Oral Daily Jeralyn Bennett, MD   300 mg at 12/13/14 1203  . atorvastatin (LIPITOR) tablet 10 mg  10 mg Oral Daily Jeralyn Bennett, MD   10 mg at 12/13/14 1202  . budesonide (PULMICORT) nebulizer solution 0.5 mg  0.5 mg Nebulization BID Rahul P Desai, PA-C   0.5 mg at 12/14/14 0758  . cefUROXime (CEFTIN) tablet 500 mg  500 mg Oral BID WC Vassie Loll, MD   500 mg at 12/13/14 1804  . diltiazem (CARDIZEM CD) 24 hr capsule 360 mg  360 mg Oral Daily Vassie Loll, MD   360 mg at 12/13/14 1202  . escitalopram (LEXAPRO) tablet 20 mg  20 mg Oral Daily Jeralyn Bennett, MD   20 mg at 12/13/14  1203  . furosemide (LASIX) injection 40 mg  40 mg Intravenous BID Vassie Loll, MD   40 mg at 12/13/14 1805  . gabapentin (NEURONTIN) capsule 300 mg  300 mg Oral Daily Jeralyn Bennett, MD   300 mg at 12/13/14 1202  . heparin ADULT infusion 100 units/mL (25000 units/250 mL)  1,400 Units/hr Intravenous Continuous Darl Householder Masters, [facility] 12 mL/hr at 12/13/14 1708 1,200 Units/hr at 12/13/14 1708  . levalbuterol (XOPENEX) nebulizer solution 0.63 mg  0.63 mg Nebulization Q2H PRN Rahul P Desai, PA-C      . magic mouthwash w/lidocaine  5 mL Oral TID PRN Vassie Loll, MD   5 mL at 12/12/14 2105  . metoprolol tartrate (LOPRESSOR) tablet 12.5 mg  12.5 mg Oral BID Vassie Loll, MD   12.5 mg at 12/13/14 1943  . nystatin (MYCOSTATIN) 100000 UNIT/ML suspension 500,000 Units  5 mL Oral QID Vassie Loll, MD   500,000 Units at 12/13/14 2223  . ondansetron (ZOFRAN) tablet 4 mg  4 mg Oral Q6H PRN Jeralyn Bennett, MD       Or  . ondansetron (ZOFRAN) injection 4 mg  4 mg Intravenous Q6H PRN Jeralyn Bennett, MD      . oxyCODONE (Oxy IR/ROXICODONE) immediate release tablet 5 mg  5 mg Oral Q4H PRN Jeralyn Bennett, MD      .  potassium chloride SA (K-DUR,KLOR-CON) CR tablet 40 mEq  40 mEq Oral Daily Vassie Loll, MD   40 mEq at 12/13/14 1203  . predniSONE (DELTASONE) tablet 30 mg  30 mg Oral Q breakfast Vassie Loll, MD   30 mg at 12/13/14 1433  . sodium chloride 0.9 % injection 3 mL  3 mL Intravenous Q12H Jeralyn Bennett, MD   3 mL at 12/11/14 2200  . sodium chloride 0.9 % injection 3 mL  3 mL Intravenous Q12H Jeralyn Bennett, MD   3 mL at 12/13/14 2223  . sodium chloride 0.9 % injection 3 mL  3 mL Intravenous PRN Jeralyn Bennett, MD      . Warfarin - Pharmacist Dosing Inpatient   Does not apply q1800 Marquita Palms, [facility]        PE: General appearance: alert, cooperative, no distress and morbidly obese Neck: no carotid bruit and no JVD Lungs: CTA anteriorly Heart: irregularly irregular rhythm and tachy  rate Extremities: trace + LEE Pulses: 2+ and symmetric Skin: warm and dry Neurologic: Grossly normal  Lab Results:   Recent Labs  12/13/14 0619 12/14/14 0300  WBC 10.1 9.9  HGB 14.4 13.0  HCT 44.1 40.3  PLT 92* 104*   BMET  Recent Labs  12/12/14 0528 12/13/14 0619 12/14/14 0300  NA 138 138 141  K 4.7 4.4 4.9  CL 98* 94* 100*  CO2 35* 32 33*  GLUCOSE 108* 92 110*  BUN 42* 44* 35*  CREATININE 1.27* 1.38* 1.09*  CALCIUM 9.2 9.1 9.0   PT/INR  Recent Labs  12/12/14 0528 12/13/14 0619 12/14/14 0300  LABPROT 19.1* 18.8* 19.4*  INR 1.61* 1.57* 1.64*     Assessment/Plan  Principal Problem:   Sepsis Active Problems:   CKD (chronic kidney disease) stage 3, GFR 30-59 ml/min   Interstitial lung disease   Debilitated   UTI (lower urinary tract infection)   Atrial fibrillation with RVR   FTT (failure to thrive) in adult   Blood poisoning   Weakness   Acute on chronic respiratory failure with hypoxia   1. Afib: Failed DCCV. No amiodarone due to interstitial lung diseae. Seen by EP on 8/1. Per Dr. Johney Frame, "Given comorbidities, obesity, and OSA, I anticipate that her ability to maintain sinus rhythm long term is quite low. At this time, I would favor rate control. Continue coumadin. Could return electively for cardioversion after INR has been therapeutic for 3 weeks after sepsis has fully resolved and patient has recovered. "    -  INR sub therapuetic on coumadin.Pharmacy mangaing.On cardizem and beta blocker. HR remains poorly controlled in the 120s. SBP in the 110s. She received 2.5 mg of IV Lopressor at 0544 and 10 mg IV cardizem 0730. PO Cardizem and metoprolol scheduled. May need  IV cardizem infusion if BP can tolerate.    2. Diastolic CHF: I/Os + 5L. Continue bid IV lasix.   3. COPD/ILD: On chronic steroids cushingoid ?   4. Chol: On statin .     LOS: 10 days    Brittainy M. Delmer Islam 12/14/2014 8:32 AM

## 2014-12-14 NOTE — Progress Notes (Signed)
Pt remains on BIPAP at this time. Pt resting no distress noted.

## 2014-12-15 DIAGNOSIS — R0602 Shortness of breath: Secondary | ICD-10-CM

## 2014-12-15 LAB — BASIC METABOLIC PANEL
Anion gap: 6 (ref 5–15)
BUN: 27 mg/dL — ABNORMAL HIGH (ref 6–20)
CO2: 34 mmol/L — ABNORMAL HIGH (ref 22–32)
CREATININE: 1.16 mg/dL — AB (ref 0.44–1.00)
Calcium: 9.2 mg/dL (ref 8.9–10.3)
Chloride: 99 mmol/L — ABNORMAL LOW (ref 101–111)
GFR calc Af Amer: 50 mL/min — ABNORMAL LOW (ref >60)
GFR, EST NON AFRICAN AMERICAN: 43 mL/min — AB (ref >60)
GLUCOSE: 113 mg/dL — AB (ref 65–99)
Potassium: 5.4 mmol/L — ABNORMAL HIGH (ref 3.5–5.1)
Sodium: 139 mmol/L (ref 135–145)

## 2014-12-15 LAB — CBC
HEMATOCRIT: 40.8 % (ref 36.0–46.0)
Hemoglobin: 12.8 g/dL (ref 12.0–15.0)
MCH: 33 pg (ref 26.0–34.0)
MCHC: 31.4 g/dL (ref 30.0–36.0)
MCV: 105.2 fL — ABNORMAL HIGH (ref 78.0–100.0)
Platelets: 103 10*3/uL — ABNORMAL LOW (ref 150–400)
RBC: 3.88 MIL/uL (ref 3.87–5.11)
RDW: 16.6 % — ABNORMAL HIGH (ref 11.5–15.5)
WBC: 9.4 10*3/uL (ref 4.0–10.5)

## 2014-12-15 LAB — PROTIME-INR
INR: 2.06 — AB (ref 0.00–1.49)
PROTHROMBIN TIME: 23.1 s — AB (ref 11.6–15.2)

## 2014-12-15 LAB — HEPARIN LEVEL (UNFRACTIONATED)
HEPARIN UNFRACTIONATED: 0.81 [IU]/mL — AB (ref 0.30–0.70)
Heparin Unfractionated: 0.55 IU/mL (ref 0.30–0.70)

## 2014-12-15 MED ORDER — METOPROLOL TARTRATE 50 MG PO TABS
50.0000 mg | ORAL_TABLET | Freq: Two times a day (BID) | ORAL | Status: DC
  Administered 2014-12-15 – 2014-12-29 (×29): 50 mg via ORAL
  Filled 2014-12-15 (×35): qty 1

## 2014-12-15 MED ORDER — WARFARIN SODIUM 2.5 MG PO TABS
2.5000 mg | ORAL_TABLET | Freq: Once | ORAL | Status: AC
  Administered 2014-12-15: 2.5 mg via ORAL
  Filled 2014-12-15: qty 1

## 2014-12-15 MED ORDER — FUROSEMIDE 10 MG/ML IJ SOLN
40.0000 mg | Freq: Two times a day (BID) | INTRAMUSCULAR | Status: DC
  Administered 2014-12-16 – 2014-12-17 (×3): 40 mg via INTRAVENOUS
  Filled 2014-12-15 (×6): qty 4

## 2014-12-15 NOTE — Progress Notes (Signed)
Name: [name] [name] MRN: [id number] DOB: 01-29-1932    ADMISSION DATE:  12/04/2014 CONSULTATION DATE:  12/15/2014  REFERRING MD :  Gwenlyn Perking  CHIEF COMPLAINT:  Dyspnea  BRIEF PATIENT DESCRIPTION: 81 y.o. F with end stage ILD (followed by Dr. Kendrick Fries) who was recently admitted by [facility] for ILD flare and parainfluenza.  She was discharged to CIR but then re-admitted by [facility] for urosepsis, AFRVR and AoC dCHF.  On 8/2, she had drop in O2 sats and increased WOB so PCCM consulted for further recs.  SIGNIFICANT EVENTS  7/6/-7/14  Admitted with ILD flare and parainfluenza, d/c'd to CIR for rehab 7/24  Re-admitted with urosepsis, AFRVR and AoC dCHF. 8/06  Had drop in SpO2 and increase in WOB after using bedside commode, transferred to SDU and PCCM consulted.  STUDIES:  CXR 8/2 >>> persistent CHF, no acute process. Echo 7/7 >>>  EF 55-60%, PAP 55.    SUBJECTIVE:  Pt reports feeling much better.  Denies SOB/fevers, chills.  Notes decrease in swelling.  I/O's appear to be even for last 24 hours but net neg ~5L this admit (?reliability)  VITAL SIGNS: Temp:  [96.1 F (35.6 C)-98.1 F (36.7 C)] 96.8 F (36 C) (08/04 0850) Pulse Rate:  [29-113] 103 (08/04 0931) Resp:  [15-35] 15 (08/04 0850) BP: (100-141)/(61-90) 117/84 mmHg (08/04 0929) SpO2:  [92 %-98 %] 96 % (08/04 0850) FiO2 (%):  [40 %] 40 % (08/04 0808) Weight:  [281 lb 1.4 oz (127.5 kg)] 281 lb 1.4 oz (127.5 kg) (08/04 0500)  PHYSICAL EXAMINATION: General: Obese female, resting in bed, in NAD. Neuro: A&O x 3, non-focal.  HEENT: [place]/AT. PERRL, sclerae anicteric. BiPAP in place. Cardiovascular: RRR, no M/R/G.  Lungs: Respirations shallow and unlabored.  BS distant with faint bibasilar crackles and occasional wheeze. No accessory muscle use; however, mild paradoxical respirations. Abdomen: Obese, BS x 4, soft, NT/ND.  Musculoskeletal: No gross deformities, 2+ peripheral pitting edema.  Skin: Intact, warm, no rashes.    Recent Labs Lab  12/13/14 0619 12/14/14 0300 12/15/14 0250  NA 138 141 139  K 4.4 4.9 5.4*  CL 94* 100* 99*  CO2 32 33* 34*  BUN 44* 35* 27*  CREATININE 1.38* 1.09* 1.16*  GLUCOSE 92 110* 113*    Recent Labs Lab 12/13/14 0619 12/14/14 0300 12/15/14 0250  HGB 14.4 13.0 12.8  HCT 44.1 40.3 40.8  WBC 10.1 9.9 9.4  PLT 92* 104* 103*   Dg Chest Port 1 View  12/14/2014   CLINICAL DATA:  Respiratory failure.  EXAM: PORTABLE CHEST - 1 VIEW  COMPARISON:  12/13/2014.  FINDINGS: Cardiomegaly with pulmonary interstitial prominence and left pleural effusion again noted. Findings consistent with persistent congestive heart failure. Left base atelectasis and/or infiltrate. Bilateral pleural thickening. This is most consistent scarring. No acute bony abnormality.  IMPRESSION: 1. Persistent changes of persist congestive heart failure with pulmonary interstitial edema and left pleural effusion.  2. Left base atelectasis and/or infiltrate.   Electronically Signed   By: Maisie Fus  Register   On: 12/14/2014 06:59   Dg Chest Port 1 View  12/13/2014   CLINICAL DATA:  Shortness of Breath  EXAM: PORTABLE CHEST - 1 VIEW  COMPARISON:  December 05, 2014  FINDINGS: There is stable cardiomegaly with mild interstitial edema. No airspace consolidation. There is a questionable small left effusion. No adenopathy. There is atherosclerotic change in the aorta. No bone lesions.  IMPRESSION: There is persistent congestive heart failure. No frank airspace consolidation. No new opacity appreciable.  Electronically Signed   By: Bretta Bang III M.D.   On: 12/13/2014 17:01    ASSESSMENT / PLAN:  Acute on chronic hypoxemic respiratory failure. End stage ILD / UIP. Pulmonary edema. OSA - uses nocturnal BiPAP as outpatient (she states was previously on CPAP but has since been switched). PAH (PAP 55 by echo from 11/17/14).  Plan: Nocturnal bipap only Continue supplemental O2 as needed to maintain SpO2 > 90%. Continue steroids (don't think any  role in increasing steroid dose as her worsening respiratory status is likely due to pulmonary edema / CHF vs sudden ILD flare), budesonide, levalbuterol. Agree with aggressive diuresis as renal function / BP permit Incentive spirometry. Intermittent CXR Follow up with Dr. Kendrick Fries post discharge    A.fib with RVR - resolving. AoC dCHF - last echo from 11/17/14 with EF 55 - 60%.  Plan: Continue cardizem, metoprolol, heparin with transition back to warfarin. Aggressive diuresis as able. Consider adding metolazone to follow her outpatient regimen.  Rest per Primary.   PCCM will sign off.  Please call back if we can be of further assistance.    Canary Brim, NP-C [place] [facility] Pgr: [phone] or if no answer [phone] 12/15/2014, 11:43 AM

## 2014-12-15 NOTE — Progress Notes (Signed)
[facility] PROGRESS NOTE  [name] [name] ION:[id number] DOB: [birth year] DOA: 12/04/2014 PCP: Kerby Nora, MD  Interim summary 81 y.o. female with a past medical history of pulmonary fibrosis/interstitial pneumonitis, diastolic congestive heart failure, chronic hypoxemic respiratory failure, atrial fibrillation on chronic anticoagulation who was recently admitted to the pulmonary critical care medicine service on 11/16/2014 discharged on 11/24/2014 to acute rehabilitation. During the hospitalization she initially presented with acute on chronic respiratory failure with hypoxemia which was believed to be secondary to acute flare up of end-stage interstitial lung disease. She was treated with IV steroids and broad-spectrum IV antimicrobial therapy. He was found to have a parainfluenza viral infection. She showed slow clinical improvement however becoming debilitated from prolonged hospitalization. She was discharged to inpatient rehabilitation on 11/24/2014. Readmitted on 12/04/14 due to sepsis from UTI and A. Fib with RVR. Course complicated with acute on chronic diastolic heart failure and pulmonary edema. Patient Being diuresed currently. PCCM and cardiology on board.  Assessment/Plan: 1. Sepsis due to UTI -Present on admission, evidenced by a white count of 20,000 thousand 500, respiratory 27, heart rate 150s, lactic acid of 4.15. -Source of infection could be urinary tract infection as u/a showed presence of pyuria and bacteria. -Patient showing clinical improvement with a downward trend in her white count to 10,000 from 22,000 on admission. She is afebrile and w/o dysuria -Continue antibiotic therapy now adjusted base on sensitivity and transition to Oral regimen.  -last dose of antibiotics 8-4.   2.  Atrial fibrillation with rapid ventricular response. -I suspect underlying infectious process and advanced lung disease precipitating A. fib with RVR -continue cardizem to 240 mg daily and  metoprolol 25 mg BID. Digoxin started 8-03 -CHADVASC score 5  3.  End-stage interstitial lung disease. -Patient having acute on chronic hypoxemic respiratory failure on admission with an increase in her oxygen requirements having a respiratory rate of 27.  -Chest x-ray repeated on 12/13/2014 demonstrated pulmonary edema; no infiltrates  - continue tapering steroids slowly -Continue with  IV lasix -PCCM consulted and following.   4.  Chronic anticoagulation. -Pharmacy was consulted for Coumadin management -INR to be adjusted by pharmacy -discontinue heparin Gtt  5.  Acute on Chronic diastolic congestive heart failure -Transthoracic echocardiogram performed on 11/17/2014 showed preserved ejection fraction. -most likely due to A. fib -IV lasix BID and PRN BIPAP for SOB -close follow up of renal function and electrolytes -EP on board and helping controlling A. fib -Cardiology following.  -urine out put: 1.2 L. Weight: 126---129---127  6.  Proteus mirabilis and klebsiella Pneumoniae Urinary tract infection -Suspect may be cause of sepsis - patient was on ceftin final day of Abx's 8/4) -no fever and no dysuria   7.  Hypokalemia. -now hyperkalemia. Will discontinue potassium.   8. Thrush  -will continue patient on nystatin and magic mouthwash -improving/resolving  Code Status: Full code Family Communication: sons at bedside.  Disposition Plan: home with home health therapy (patient refusing SNF)  Consultation:  Cardiology   PCCM  EP  Antibiotics:  Vancomycin 7/24>>7/27  Elita Quick 7/24>>7/27  Rocephin 7/27>>7/32  Ceftin 7/31>>8/3  HPI/Subjective: Off bpap, feeling better today. Breathing better  Objective: Filed Vitals:   12/15/14 0400  BP: 133/89  Pulse:   Temp: 97.9 F (36.6 C)  Resp: 20    Intake/Output Summary (Last 24 hours) at 12/15/14 0808 Last data filed at 12/15/14 0600  Gross per 24 hour  Intake 1290.51 ml  Output    950 ml  Net 340.51 ml  Filed Weights   12/13/14 0559 12/14/14 0500 12/15/14 0500  Weight: 127.5 kg (281 lb 1.4 oz) 129 kg (284 lb 6.3 oz) 127.5 kg (281 lb 1.4 oz)    Exam:   General:  .Awake, alert and oriented X 3.   Cardiovascular: Irregular rate and rhythm,  Respiratory:  positive crackles bilaterally,  Abdomen: Obese, soft nontender nondistended  Musculoskeletal: 2+ bilateral extremity pitting edema  Data Reviewed: Basic Metabolic Panel:  Recent Labs Lab 12/10/14 0243 12/11/14 0239 12/12/14 0528 12/13/14 0619 12/14/14 0300 12/15/14 0250  NA 137 137 138 138 141 139  K 4.1 4.4 4.7 4.4 4.9 5.4*  CL 90* 92* 98* 94* 100* 99*  CO2 36* 36* 35* 32 33* 34*  GLUCOSE 140* 131* 108* 92 110* 113*  BUN 49* 45* 42* 44* 35* 27*  CREATININE 1.27* 1.24* 1.27* 1.38* 1.09* 1.16*  CALCIUM 8.8* 8.9 9.2 9.1 9.0 9.2  MG 2.1  --   --   --  2.2  --   PHOS  --   --   --   --  3.4  --    CBC:  Recent Labs Lab 12/11/14 0239 12/13/14 0619 12/14/14 0300 12/15/14 0250  WBC 15.3* 10.1 9.9 9.4  HGB 13.5 14.4 13.0 12.8  HCT 41.9 44.1 40.3 40.8  MCV 101.9* 103.5* 101.5* 105.2*  PLT 90* 92* 104* 103*   BNP (last 3 results)  Recent Labs  10/14/14 0420 11/16/14 1851 12/04/14 1423  BNP 301.7* 160.9* 280.1*    ProBNP (last 3 results)  Recent Labs  02/22/14 0947 02/24/14 0332 03/07/14 0648  PROBNP 2319.0* 2410.0* 437.5    CBG: No results for input(s): GLUCAP in the last 168 hours.  No results found for this or any previous visit (from the past 240 hour(s)).   Studies: Dg Chest Port 1 View  12/14/2014   CLINICAL DATA:  Respiratory failure.  EXAM: PORTABLE CHEST - 1 VIEW  COMPARISON:  12/13/2014.  FINDINGS: Cardiomegaly with pulmonary interstitial prominence and left pleural effusion again noted. Findings consistent with persistent congestive heart failure. Left base atelectasis and/or infiltrate. Bilateral pleural thickening. This is most consistent scarring. No acute bony abnormality.   IMPRESSION: 1. Persistent changes of persist congestive heart failure with pulmonary interstitial edema and left pleural effusion.  2. Left base atelectasis and/or infiltrate.   Electronically Signed   By: Maisie Fus  Register   On: 12/14/2014 06:59   Dg Chest Port 1 View  12/13/2014   CLINICAL DATA:  Shortness of Breath  EXAM: PORTABLE CHEST - 1 VIEW  COMPARISON:  December 05, 2014  FINDINGS: There is stable cardiomegaly with mild interstitial edema. No airspace consolidation. There is a questionable small left effusion. No adenopathy. There is atherosclerotic change in the aorta. No bone lesions.  IMPRESSION: There is persistent congestive heart failure. No frank airspace consolidation. No new opacity appreciable.   Electronically Signed   By: Bretta Bang III M.D.   On: 12/13/2014 17:01    Scheduled Meds: . allopurinol  300 mg Oral Daily  . atorvastatin  10 mg Oral Daily  . budesonide  0.5 mg Nebulization BID  . cefUROXime  500 mg Oral BID WC  . digoxin  0.25 mg Oral Daily  . diltiazem  240 mg Oral Daily  . escitalopram  20 mg Oral Daily  . furosemide  40 mg Intravenous BID  . gabapentin  300 mg Oral Daily  . metoprolol tartrate  25 mg Oral BID  . nystatin  5 mL  Oral QID  . predniSONE  30 mg Oral Q breakfast  . sodium chloride  3 mL Intravenous Q12H  . sodium chloride  3 mL Intravenous Q12H  . Warfarin - Pharmacist Dosing Inpatient   Does not apply q1800   Continuous Infusions: . heparin 1,100 Units/hr (12/15/14 0107)    Principal Problem:   Sepsis Active Problems:   CKD (chronic kidney disease) stage 3, GFR 30-59 ml/min   Interstitial lung disease   Debilitated   UTI (lower urinary tract infection)   Atrial fibrillation with RVR   FTT (failure to thrive) in adult   Blood poisoning   Weakness   Acute on chronic respiratory failure with hypoxia    Time spent: 40 minutes    Jessieca Rhem A  [facility] Pager [phone]. If 7PM-7AM, please contact night-coverage at  [email or web address], password [facility] 12/15/2014, 8:08 AM  LOS: 11 days

## 2014-12-15 NOTE — Progress Notes (Addendum)
Physical Therapy Treatment Patient Details Name: [name] [name] MRN: [id number] DOB: 10/19/1931 Today's Date: 12/15/2014    History of Present Illness Patient is an 81 year old female with a past medical history of end-stage pulmonary fibrosis, chronic diastolic congestive heart failure, chronic hypoxemic respiratory failure, atrial fibrillation who was admitted to the medicine service on 12/04/2014 when she presented with complaints of a generalized weakness and functional decline. She had a recent hospitalization for acute on chronic hypoxemic respiratory failure believed to be secondary to acute flareup of end-stage interstitial lung disease and viral infection. She was discharged to inpatient rehabilitation. She was discharged from inpatient rehabilitation on 11/24/2014 and had been doing poorly at home. She was found to be in atrial fibrillation rapid ventricular response, encephalopathic, having sepsis.  Dx wth UTI.     PT Comments    Pt continues to have extremely limited cardiopulmonary capacity for any activity. Discussed with pt the benefit of SNF and assist needed for home and she continues to state home. Pt educated for breathing technique and energy conservation. Pt is unable to maintain sats >87% on 6L when sitting and talking. When not talking she can achieve 92%, with HEP 88% with HR of 104. Pt required resting breaks between exercises as well as 15 min of recovery during transition to side and OOB. Will continue to follow as pt able to tolerate.   Follow Up Recommendations  SNF;Other (comment) (SNF recommendation. pt refuses and for home HHPT)     Equipment Recommendations  None recommended by PT    Recommendations for Other Services       Precautions / Restrictions Precautions Precautions: Fall Precaution Comments: monitor O2 sats and HR    Mobility  Bed Mobility Overal bed mobility: Needs Assistance Bed Mobility: Rolling;Sidelying to Sit Rolling: Min assist Sidelying  to sit: Min assist       General bed mobility comments: Pt sidelying on arrival after nursing care. Rolled to left with sats dropping to 81%, with transition to sitting sats dropped to 76%. Cues and assist to elevate trunk from surface. 5 min seated rest with transition to sitting for sats to achieve 85%  Transfers     Transfers: Stand Pivot Transfers Sit to Stand: Min guard Stand pivot transfers: Min assist       General transfer comment: min assist for balance and stability with pt maintaining flexed trunk with decreased descent control. Sats dropped to 83% with transfer and required 10 min to achieve 87% on 6L  Ambulation/Gait Ambulation/Gait assistance:  (unable secondary to oxygen saturation)               Stairs            Wheelchair Mobility    Modified Rankin (Stroke Patients Only)       Balance Overall balance assessment: Needs assistance   Sitting balance-Leahy Scale: Good       Standing balance-Leahy Scale: Poor                      Cognition Arousal/Alertness: Awake/alert Behavior During Therapy: WFL for tasks assessed/performed Overall Cognitive Status: Within Functional Limits for tasks assessed                      Exercises General Exercises - Lower Extremity Long Arc Quad: AROM;Both;Seated;20 reps Hip Flexion/Marching: AROM;Both;Seated;15 reps    General Comments        Pertinent Vitals/Pain Pain Assessment: No/denies pain    Home  Living                      Prior Function            PT Goals (current goals can now be found in the care plan section) Acute Rehab PT Goals Time For Goal Achievement: 12/29/14 Progress towards PT goals: Not progressing toward goals - comment;Goals downgraded-see care plan (cardiopulmonary tolerance limiting function)    Frequency       PT Plan Current plan remains appropriate    Co-evaluation             End of Session Equipment Utilized During  Treatment: Oxygen Activity Tolerance: Patient limited by fatigue;Treatment limited secondary to medical complications (Comment) Patient left: in chair;with call bell/phone within reach     Time: 1350-1432 PT Time Calculation (min) (ACUTE ONLY): 42 min  Charges:  $Therapeutic Exercise: 8-22 mins $Therapeutic Activity: 8-22 mins                    G Codes:      Delorse Lek December 18, 2014, 2:48 PM  Delaney Meigs, PT [phone]

## 2014-12-15 NOTE — Progress Notes (Addendum)
ANTICOAGULATION CONSULT NOTE - Follow Up Consult  Pharmacy Consult for Coumadin and heparin Indication: atrial fibrillation and hx DVT  Allergies  Allergen Reactions  . Pirfenidone Other (See Comments)    Congestion, breathing diffulty    Patient Measurements: Height: 5\' 4"  (162.6 cm) Weight: 281 lb 1.4 oz (127.5 kg) IBW/kg (Calculated) : 54.7  Heparin Dosing Weight: 85kg  Vital Signs: Temp: 96.8 F (36 C) (08/04 0850) Temp Source: Axillary (08/04 0850) BP: 117/84 mmHg (08/04 0929) Pulse Rate: 103 (08/04 0931)  Labs:  Recent Labs  12/13/14 0619  12/14/14 0300 12/14/14 1153 12/15/14 0004 12/15/14 0250 12/15/14 0845  HGB 14.4  --  13.0  --   --  12.8  --   HCT 44.1  --  40.3  --   --  40.8  --   PLT 92*  --  104*  --   --  103*  --   LABPROT 18.8*  --  19.4*  --   --  23.1*  --   INR 1.57*  --  1.64*  --   --  2.06*  --   HEPARINUNFRC 0.31  < > 0.64 0.77* 0.81*  --  0.55  CREATININE 1.38*  --  1.09*  --   --  1.16*  --   < > = values in this interval not displayed.  Estimated Creatinine Clearance: 50.3 mL/min (by C-G formula based on Cr of 1.16).  Assessment: 83yof admitted 12/04/2014 for AFib with RVR, encephalopathy, and met sepsis criteria on warfarin for hx Afib & DVT. INR is now therapeutic. No bleeding noted. Pt also continues on heparin bridge and level is at goal. CBC is stable but platelets are low so will need to watch closely. No bleeding noted.   Goal of Therapy:  INR 1.8-2.2 per outpatient anticoag clinic note 10/31/14 Heparin level 0.3-0.7 units/ml Monitor platelets by anticoagulation protocol: Yes   Plan: - Continue heparin gtt at 1100 units/hr - MD consider DC since INR is therapeutic - Warfarin 2.5mg  PO x 1 tonight - Daily INR, heparin level and CBC  Salome Arnt, PharmD, BCPS Pager # [phone] 12/15/2014 9:45 AM

## 2014-12-15 NOTE — Progress Notes (Signed)
ANTICOAGULATION CONSULT NOTE - Follow Up Consult  Pharmacy Consult for heparin Indication: Afib and h/o DVT   Labs:  Recent Labs  12/12/14 0528 12/13/14 0619  12/14/14 0300 12/14/14 1153 12/15/14 0004  HGB  --  14.4  --  13.0  --   --   HCT  --  44.1  --  40.3  --   --   PLT  --  92*  --  104*  --   --   LABPROT 19.1* 18.8*  --  19.4*  --   --   INR 1.61* 1.57*  --  1.64*  --   --   HEPARINUNFRC  --  0.31  < > 0.64 0.77* 0.81*  CREATININE 1.27* 1.38*  --  1.09*  --   --   < > = values in this interval not displayed.    Assessment: 81yo female remains supratherapeutic on heparin with higher heparin level despite decreased rate.  Goal of Therapy:  Heparin level 0.3-0.7 units/ml   Plan:  Will decrease heparin gtt by 2 units/kg/hr to 1100 units/hr and check level in 8hr.  Vernard Gambles, PharmD, BCPS  12/15/2014,12:49 AM

## 2014-12-15 NOTE — Progress Notes (Signed)
Patient Name: [name] [name] Date of Encounter: 12/15/2014  Principal Problem:   Sepsis Active Problems:   CKD (chronic kidney disease) stage 3, GFR 30-59 ml/min   Interstitial lung disease   Debilitated   UTI (lower urinary tract infection)   Atrial fibrillation with RVR   FTT (failure to thrive) in adult   Blood poisoning   Weakness   Acute on chronic respiratory failure with hypoxia    Patient Profile: 38-yo female, w/ PMH of interstitial lung disease (on chronic prednisone and home O2), chronic Diastolic HF, and chronic afib (on warfarin) admitted on 12/04/14 for sepsis.Hospitalized 07/06>07/14 w/ resp failure, then in rehab thru 07/22. Lowest wt in July 276 lbs.  SUBJECTIVE: Patient taken off of BiPAP this morning. Reports feeling much better. Denies any palpitations. Denies chest pain.  OBJECTIVE Filed Vitals:   12/15/14 0107 12/15/14 0322 12/15/14 0400 12/15/14 0500  BP: 121/69  133/89   Pulse:  88    Temp:   97.9 F (36.6 C)   TempSrc:   Axillary   Resp: Height:      Weight:    281 lb 1.4 oz (127.5 kg)  SpO2: 97% 97% 98%     Intake/Output Summary (Last 24 hours) at 12/15/14 0802 Last data filed at 12/15/14 0600  Gross per 24 hour  Intake 1290.51 ml  Output    950 ml  Net 340.51 ml   Filed Weights   12/13/14 0559 12/14/14 0500 12/15/14 0500  Weight: 281 lb 1.4 oz (127.5 kg) 284 lb 6.3 oz (129 kg) 281 lb 1.4 oz (127.5 kg)    PHYSICAL EXAM General: Obese female, alert, in no apparent distress Head: Normocephalic, atraumatic.  Neck: Supple without bruits, no JVD - not able to assess 2nd body habitus. Lungs:  Resp regular and unlabored, with rales present bilaterally. Heart: Irregularly irregular rhythm. Rate 80-90's. No sig murmur Abdomen: Soft, non-tender, non-distended, BS + x 4.  Extremities: 2+ pitting edema bilaterally. Distal pulses 2+. Neuro: Alert and oriented X 3. Moves all extremities spontaneously. Psych: Normal  affect.  LABS: CBC: Recent Labs  12/14/14 0300 12/15/14 0250  WBC 9.9 9.4  HGB 13.0 12.8  HCT 40.3 40.8  MCV 101.5* 105.2*  PLT 104* 103*   INR: Recent Labs  12/15/14 0250  INR 2.06*   Basic Metabolic Panel: Recent Labs  12/14/14 0300 12/15/14 0250  NA 141 139  K 4.9 5.4*  CL 100* 99*  CO2 33* 34*  GLUCOSE 110* 113*  BUN 35* 27*  CREATININE 1.09* 1.16*  CALCIUM 9.0 9.2  MG 2.2  --   PHOS 3.4  --    BNP:  B NATRIURETIC PEPTIDE  Date/Time Value Ref Range Status  12/04/2014 02:23 PM 280.1* 0.0 - 100.0 pg/mL Final  11/16/2014 06:51 PM 160.9* 0.0 - 100.0 pg/mL Final    TELE: Atrial Fibrillation with rate of 80-90's.  Higher w/ movement.       Radiology/Studies: Dg Chest Port 1 View 12/14/2014   CLINICAL DATA:  Respiratory failure.  EXAM: PORTABLE CHEST - 1 VIEW  COMPARISON:  12/13/2014.  FINDINGS: Cardiomegaly with pulmonary interstitial prominence and left pleural effusion again noted. Findings consistent with persistent congestive heart failure. Left base atelectasis and/or infiltrate. Bilateral pleural thickening. This is most consistent scarring. No acute bony abnormality.  IMPRESSION: 1. Persistent changes of persist congestive heart failure with pulmonary interstitial edema and left pleural effusion.  2. Left base atelectasis and/or infiltrate.  Electronically Signed   By: Maisie Fus  Register   On: 12/14/2014 06:59    Current Medications:  . allopurinol  300 mg Oral Daily  . atorvastatin  10 mg Oral Daily  . budesonide  0.5 mg Nebulization BID  . cefUROXime  500 mg Oral BID WC  . digoxin  0.25 mg Oral Daily  . diltiazem  240 mg Oral Daily  . escitalopram  20 mg Oral Daily  . furosemide  40 mg Intravenous BID  . gabapentin  300 mg Oral Daily  . metoprolol tartrate  25 mg Oral BID  . nystatin  5 mL Oral QID  . potassium chloride  40 mEq Oral Daily  . predniSONE  30 mg Oral Q breakfast  . sodium chloride  3 mL Intravenous Q12H  . sodium chloride  3 mL  Intravenous Q12H  . Warfarin - Pharmacist Dosing Inpatient   Does not apply q1800   . heparin 1,100 Units/hr (12/15/14 0107)    ASSESSMENT AND PLAN:  1. Atrial Fibrillation with RVR - failed DCC.  Consult with EP on 12/12/2014, with Dr. Johney Frame saying he would favor rate control. HR has been variable between 85-120 wirh BP's 94/73 - 142/77 over past 24 hours. - Pharmacy managing Coumadin. INR 2.06 on 12/15/2014. - Continue Cardizem 240mg  daily - Increase Lopressor to 50 mg BID - Continue Digoxin 0.25mg  daily  2. Chronic Diastolic CHF - Admit weight (and Baseline weight per patient) 264 lbs, but weight on the same day was 299 (more consistent w/ 2016 weights). Weight on 12/15/2014 is 281 lbs.Believe dry weight in the mid 270s. - Continue Lasix 40mg  IV BID - with elevated K+, no hemolysis, d/c supp and follow BMET.  3. COPD/ILD - On chronic steroids  4. Hyperlipidemia - Continue Lipitor 10mg  daily  Otherwise, per IM  SignedTheodore Demark , PA-C 8:02 AM 12/15/2014

## 2014-12-15 NOTE — Progress Notes (Signed)
Noted Dr Madolyn Frieze note re: bipap as needed.  Rec'd in report that the plan is to try pt off bipap this morning. Rec'd pt on bipap, no distress noted.  BBSH clear diminished except minimal fine crackles in posterior bases.  Pt states her breathing feels "normal" for her, she feels her breathing is at her baseline.  Placed pt on 6 lpm [place] per her home o2.  VSS, sat 97%.  No distress noted. Pt denies SOB on [place] oxygen.

## 2014-12-15 NOTE — Care Management Important Message (Signed)
Important Message  Patient Details  Name: [name] [name] MRN: [id number] Date of Birth: 15-Sep-1931   Medicare Important Message Given:  Other (see comment)    Hanley Hays, RN 12/15/2014, 11:30 AM

## 2014-12-15 NOTE — Consult Note (Deleted)
Name: [name] [name] MRN: [id number] DOB: 1931-05-20    ADMISSION DATE:  12/04/2014 CONSULTATION DATE:  12/15/2014  REFERRING MD :  Gwenlyn Perking  CHIEF COMPLAINT:  Dyspnea  BRIEF PATIENT DESCRIPTION: 81 y.o. F with end stage ILD (followed by Dr. Kendrick Fries) who was recently admitted by [facility] for ILD flare and parainfluenza.  She was discharged to CIR but then re-admitted by [facility] for urosepsis, AFRVR and AoC dCHF.  On 8/2, she had drop in O2 sats and increased WOB so PCCM consulted for further recs.  SIGNIFICANT EVENTS  7/6/-7/14  Admitted with ILD flare and parainfluenza, d/c'd to CIR for rehab 7/24  Re-admitted with urosepsis, AFRVR and AoC dCHF. 8/06  Had drop in SpO2 and increase in WOB after using bedside commode, transferred to SDU and PCCM consulted.  STUDIES:  CXR 8/2 >>> persistent CHF, no acute process. Echo 7/7 >>>  EF 55-60%, PAP 55.    SUBJECTIVE:  Denies fevers/chills/sweats, chest pain, SOB, cough. Feels comfortable on BiPAP.  VITAL SIGNS: Temp:  [96.1 F (35.6 C)-98.1 F (36.7 C)] 96.8 F (36 C) (08/04 0850) Pulse Rate:  [29-113] 103 (08/04 0931) Resp:  [15-35] 15 (08/04 0850) BP: (100-141)/(61-90) 117/84 mmHg (08/04 0929) SpO2:  [92 %-98 %] 96 % (08/04 0850) FiO2 (%):  [40 %] 40 % (08/04 0808) Weight:  [281 lb 1.4 oz (127.5 kg)] 281 lb 1.4 oz (127.5 kg) (08/04 0500)  PHYSICAL EXAMINATION: General: Obese female, resting in bed, in NAD. Neuro: A&O x 3, non-focal.  HEENT: [place]/AT. PERRL, sclerae anicteric. BiPAP in place. Cardiovascular: RRR, no M/R/G.  Lungs: Respirations shallow and unlabored.  BS distant with faint bibasilar crackles and occasional wheeze. No accessory muscle use; however, mild paradoxical respirations. Abdomen: Obese, BS x 4, soft, NT/ND.  Musculoskeletal: No gross deformities, 2+ peripheral pitting edema.  Skin: Intact, warm, no rashes.    Recent Labs Lab 12/13/14 0619 12/14/14 0300 12/15/14 0250  NA 138 141 139  K 4.4 4.9 5.4*  CL 94* 100* 99*   CO2 32 33* 34*  BUN 44* 35* 27*  CREATININE 1.38* 1.09* 1.16*  GLUCOSE 92 110* 113*    Recent Labs Lab 12/13/14 0619 12/14/14 0300 12/15/14 0250  HGB 14.4 13.0 12.8  HCT 44.1 40.3 40.8  WBC 10.1 9.9 9.4  PLT 92* 104* 103*   Dg Chest Port 1 View  12/14/2014   CLINICAL DATA:  Respiratory failure.  EXAM: PORTABLE CHEST - 1 VIEW  COMPARISON:  12/13/2014.  FINDINGS: Cardiomegaly with pulmonary interstitial prominence and left pleural effusion again noted. Findings consistent with persistent congestive heart failure. Left base atelectasis and/or infiltrate. Bilateral pleural thickening. This is most consistent scarring. No acute bony abnormality.  IMPRESSION: 1. Persistent changes of persist congestive heart failure with pulmonary interstitial edema and left pleural effusion.  2. Left base atelectasis and/or infiltrate.   Electronically Signed   By: Maisie Fus  Register   On: 12/14/2014 06:59   Dg Chest Port 1 View  12/13/2014   CLINICAL DATA:  Shortness of Breath  EXAM: PORTABLE CHEST - 1 VIEW  COMPARISON:  December 05, 2014  FINDINGS: There is stable cardiomegaly with mild interstitial edema. No airspace consolidation. There is a questionable small left effusion. No adenopathy. There is atherosclerotic change in the aorta. No bone lesions.  IMPRESSION: There is persistent congestive heart failure. No frank airspace consolidation. No new opacity appreciable.   Electronically Signed   By: Bretta Bang III M.D.   On: 12/13/2014 17:01    ASSESSMENT /  PLAN:  Acute on chronic hypoxemic respiratory failure. End stage ILD / UIP. Pulmonary edema. OSA - uses nocturnal BiPAP as outpatient (she states was previously on CPAP but has since been switched). PAH (PAP 55 by echo from 11/17/14).  Plan: Nocturnal bipap only Continue supplemental O2 as needed to maintain SpO2 > 90%. Continue steroids (don't think any role in increasing steroid dose as her worsening respiratory status is likely due to pulmonary  edema / CHF vs sudden ILD flare), budesonide, levalbuterol. Agree with aggressive diuresis as renal function / BP permit Incentive spirometry. Intermittent CXR Follow up with Dr. Kendrick Fries post discharge    A.fib with RVR - resolving. AoC dCHF - last echo from 11/17/14 with EF 55 - 60%.  Plan: Continue cardizem, metoprolol, heparin with transition back to warfarin. Aggressive diuresis as able. Consider adding metolazone to follow her outpatient regimen.  Rest per Primary.   PCCM will sign off.  Please call back if we can be of further assistance.    Canary Brim, NP-C [facility] Pgr: [phone] or if no answer [phone] 12/15/2014, 11:32 AM

## 2014-12-16 DIAGNOSIS — J9621 Acute and chronic respiratory failure with hypoxia: Secondary | ICD-10-CM

## 2014-12-16 LAB — BASIC METABOLIC PANEL
ANION GAP: 7 (ref 5–15)
BUN: 25 mg/dL — AB (ref 6–20)
CALCIUM: 9.5 mg/dL (ref 8.9–10.3)
CHLORIDE: 99 mmol/L — AB (ref 101–111)
CO2: 33 mmol/L — AB (ref 22–32)
CREATININE: 1.17 mg/dL — AB (ref 0.44–1.00)
GFR calc Af Amer: 49 mL/min — ABNORMAL LOW (ref >60)
GFR, EST NON AFRICAN AMERICAN: 43 mL/min — AB (ref >60)
GLUCOSE: 97 mg/dL (ref 65–99)
POTASSIUM: 5.1 mmol/L (ref 3.5–5.1)
Sodium: 139 mmol/L (ref 135–145)

## 2014-12-16 LAB — CBC
HCT: 40.1 % (ref 36.0–46.0)
HEMOGLOBIN: 12.6 g/dL (ref 12.0–15.0)
MCH: 32.4 pg (ref 26.0–34.0)
MCHC: 31.4 g/dL (ref 30.0–36.0)
MCV: 103.1 fL — AB (ref 78.0–100.0)
Platelets: 111 10*3/uL — ABNORMAL LOW (ref 150–400)
RBC: 3.89 MIL/uL (ref 3.87–5.11)
RDW: 16.9 % — ABNORMAL HIGH (ref 11.5–15.5)
WBC: 8.3 10*3/uL (ref 4.0–10.5)

## 2014-12-16 LAB — PROTIME-INR
INR: 2.62 — AB (ref 0.00–1.49)
PROTHROMBIN TIME: 27.7 s — AB (ref 11.6–15.2)

## 2014-12-16 MED ORDER — WARFARIN SODIUM 1 MG PO TABS
1.0000 mg | ORAL_TABLET | Freq: Once | ORAL | Status: AC
  Administered 2014-12-16: 1 mg via ORAL
  Filled 2014-12-16: qty 1

## 2014-12-16 MED ORDER — DILTIAZEM HCL ER COATED BEADS 300 MG PO CP24
300.0000 mg | ORAL_CAPSULE | Freq: Every day | ORAL | Status: DC
  Administered 2014-12-16 – 2014-12-29 (×14): 300 mg via ORAL
  Filled 2014-12-16 (×16): qty 1

## 2014-12-16 NOTE — Progress Notes (Signed)
ANTICOAGULATION CONSULT NOTE - Follow Up Consult  Pharmacy Consult for Coumadin Indication: atrial fibrillation and hx DVT  Allergies  Allergen Reactions  . Pirfenidone Other (See Comments)    Congestion, breathing diffulty    Patient Measurements: Height: _0  (162.6 cm) Weight: 286 lb 13.1 oz (130.1 kg) IBW/kg (Calculated) : 54.7  Heparin Dosing Weight: 85kg  Vital Signs: Temp: 97.2 F (36.2 C) (08/05 0800) Temp Source: Axillary (08/05 0800) BP: 125/52 mmHg (08/05 0800) Pulse Rate: 107 (08/05 0800)  Labs:  Recent Labs  12/14/14 0300 12/14/14 1153 12/15/14 0004 12/15/14 0250 12/15/14 0845 12/16/14 0243  HGB 13.0  --   --  12.8  --  12.6  HCT 40.3  --   --  40.8  --  40.1  PLT 104*  --   --  103*  --  111*  LABPROT 19.4*  --   --  23.1*  --  27.7*  INR 1.64*  --   --  2.06*  --  2.62*  HEPARINUNFRC 0.64 0.77* 0.81*  --  0.55  --   CREATININE 1.09*  --   --  1.16*  --  1.17*    Estimated Creatinine Clearance: 50.5 mL/min (by C-G formula based on Cr of 1.17).  Assessment: 83yof admitted 12/04/2014 for AFib with RVR, encephalopathy, and met sepsis criteria on warfarin for hx Afib & DVT. She was on a heparin bridge while INR was subtherapeutic. Now INR is at goal- increased from 2.06 to 2.62 yesterday. INR has been quite labile while she has been in the hospital.  Of note, an outpatient anticoagulation clinic note from 05/16/2014 states her INR goal is 1.8-2. Another anticoag clinic note from 10/31/2014 states her goal is 1.8-2.2 per Dr. Rockey Situ (cardiology). However, between theses two visits, there are telephone notes called to family med clin[facility] with higher INR results with no changes made to her dosing. I spoke with Jenny Reichmann of the nursing department at [facility] and she stated they did not have any different INR goal. The last dose they had her on was 45m daily, to start 11/20/2014- she was discharged from their care 11/24/2014. Patient was discharged from CIR on  12/02/2014 with dosing of 2.589mdaily which is what pharmacy was told she was taking at the start of this admission. It is currently unclear what her INR goal should be. Looking through her history, I do not see any recent GIB or head bleed that would have caused a drop in INR goal. The only history of bleed I can find is in a note from Dr. GoRockey Situated 04/05/2014 in which he states she had a large leg hematoma in 06/2012 and epistaxis in 07/2013 (with INR 3.7) that required cauterizing. This note also states he discussed the start of a DOAC with the patient. There is no follow up regarding this change of therapy in next office visit dated 07/06/2014.  Goal of Therapy:  INR 2-3 for now until we can clarify with patient's cardiologist and/or patient herself Heparin level 0.3-0.7 units/ml Monitor platelets by anticoagulation protocol: Yes   Plan: - Warfarin 20m22mO x 1 tonight - Daily INR - follow for s/s bleeding  [name] [name] D. Serria Sloma, PharmD, BCPS Clinical Pharmacist Pager: [phone] 11:33 AM

## 2014-12-16 NOTE — Progress Notes (Signed)
CSW received another order for SNF placement on patient.  Patient assessed by CSW services on 12/08/14 and has refused SNF placement. Patient is adamant that she will retur home and has her son and a hired caregiver to help her with home care, cooking etc.  Would probably benefit from [facility].  CSW will sign off but will be available in the future if needed.  Thanks!  Lorri Frederick. Jaci Lazier, [place] [phone] (coverage)

## 2014-12-16 NOTE — Care Management Note (Signed)
Case Management Note  Patient Details  Name: [name] [name] MRN: [id number] Date of Birth: 1932/02/23  Subjective/Objective:     Adm w sepsis               Action/Plan: lives w fam   Expected Discharge Date:                  Expected Discharge Plan:  Home w Home Health Services  In-House Referral:  Clinical Social Work  Discharge planning Services  CM Consult  Post Acute Care Choice:  Resumption of Svcs/PTA Provider Choice offered to:  Patient  DME Arranged:    DME Agency:     HH Arranged:  Nurse's Aide, RN, PT HH Agency:  [facility]  Status of Service:  Completed, signed off  Medicare Important Message Given:  Other (see comment) Date Medicare IM Given:    Medicare IM give by:    Date Additional Medicare IM Given:    Additional Medicare Important Message give by:     If discussed at Long Length of Stay Meetings, dates discussed:    Additional Comments: [facility] w [facility] for rn-pt-aid, home o2 arranged by prev cm w [facility]. Spoke w pt on 8-4 and she does not want snf and plans to return home w [facility]. sw has seen also.  Hanley Hays, RN 12/16/2014, 8:27 AM

## 2014-12-16 NOTE — Progress Notes (Addendum)
SUBJECTIVE:  No complaints  OBJECTIVE:   Vitals:   Filed Vitals:   12/16/14 0053 12/16/14 0400 12/16/14 0410 12/16/14 0500  BP:  136/82    Pulse: 87 90    Temp:   98.7 F (37.1 C)   TempSrc:   Oral   Resp: 27 23    Height:      Weight:    286 lb 13.1 oz (130.1 kg)  SpO2: 94% 95%     I&O's:   Intake/Output Summary (Last 24 hours) at 12/16/14 1610 Last data filed at 12/15/14 2358  Gross per 24 hour  Intake  71.57 ml  Output    150 ml  Net -78.43 ml   TELEMETRY: Reviewed telemetry pt in atrial fibrillation:     PHYSICAL EXAM General: Well developed, well nourished, in no acute distress Head: Eyes PERRLA, No xanthomas.   Normal cephalic and atramatic  Lungs:   Clear bilaterally to auscultation and percussion. Heart:   Irregularly irregular S1 S2 Pulses are 2+ & equal. Abdomen: Bowel sounds are positive, abdomen soft and non-tender without masses  Extremities:   No clubbing, cyanosis.  1-2+ edema.  DP +1 Neuro: Alert and oriented X 3. Psych:  Good affect, responds appropriately   LABS: Basic Metabolic Panel:  Recent Labs  96/04/54 0300 12/15/14 0250 12/16/14 0243  NA 141 139 139  K 4.9 5.4* 5.1  CL 100* 99* 99*  CO2 33* 34* 33*  GLUCOSE 110* 113* 97  BUN 35* 27* 25*  CREATININE 1.09* 1.16* 1.17*  CALCIUM 9.0 9.2 9.5  MG 2.2  --   --   PHOS 3.4  --   --    Liver Function Tests: No results for input(s): AST, ALT, ALKPHOS, BILITOT, PROT, ALBUMIN in the last 72 hours. No results for input(s): LIPASE, AMYLASE in the last 72 hours. CBC:  Recent Labs  12/15/14 0250 12/16/14 0243  WBC 9.4 8.3  HGB 12.8 12.6  HCT 40.8 40.1  MCV 105.2* 103.1*  PLT 103* 111*   Cardiac Enzymes: No results for input(s): CKTOTAL, CKMB, CKMBINDEX, TROPONINI in the last 72 hours. BNP: Invalid input(s): POCBNP D-Dimer: No results for input(s): DDIMER in the last 72 hours. Hemoglobin A1C: No results for input(s): HGBA1C in the last 72 hours. Fasting Lipid Panel: No  results for input(s): CHOL, HDL, LDLCALC, TRIG, CHOLHDL, LDLDIRECT in the last 72 hours. Thyroid Function Tests: No results for input(s): TSH, T4TOTAL, T3FREE, THYROIDAB in the last 72 hours.  Invalid input(s): FREET3 Anemia Panel: No results for input(s): VITAMINB12, FOLATE, FERRITIN, TIBC, IRON, RETICCTPCT in the last 72 hours. Coag Panel:   Lab Results  Component Value Date   INR 2.62* 12/16/2014   INR 2.06* 12/15/2014   INR 1.64* 12/14/2014    RADIOLOGY: Dg Chest 2 View  12/05/2014   CLINICAL DATA:  Sepsis, CHF, interstitial lung disease, chronic renal insufficiency, failure to thrive.  EXAM: CHEST  2 VIEW  COMPARISON:  Portable chest x-ray of December 04, 2014  FINDINGS: The lungs are borderline hypoinflated especially on the right. This is stable. The interstitial markings remain coarse. There is pleural fluid versus pleural thickening in the apices and along the right lateral thoracic wall which is also stable. The lateral aspect of the left hemidiaphragm remains obscured. The cardiac silhouette is enlarged. The pulmonary vascularity is engorged. The bony thorax exhibits no acute abnormality. There is moderate degenerative disc disease of the thoracic spine.  IMPRESSION: CHF with mild pulmonary interstitial edema. Biapical pleural  fluid or pleural thickening. Left lower lobe atelectasis or pneumonia laterally, stable.   Electronically Signed   By: David  [place] M.D.   On: 12/05/2014 07:50   Dg Chest 2 View  11/17/2014   CLINICAL DATA:  Acute respiratory failure and hypoxia  EXAM: CHEST  2 VIEW  COMPARISON:  Portable chest x-ray of November 16, 2014  FINDINGS: AP and lateral semi-upright views of the chest are reviewed. The lungs are reasonably well inflated. There is pleural fluid versus pleural thickening along the periphery of the pleural surface surrounding the right lung. This is stable. The pulmonary interstitial markings remain increased. There are confluent alveolar opacities bilaterally which  are less conspicuous today. The cardiac silhouette is enlarged. The central pulmonary vascularity is engorged but improved since yesterday's study. The trachea is midline. There is no pneumothorax.  IMPRESSION: Slight interval improvement in the appearance of the pulmonary vascularity and pulmonary interstitium which suggest minimal improvement in CHF. There is no alveolar pneumonia. Chronic pleural thickening versus pleural fluid is stable surrounding the right lung.   Electronically Signed   By: David  [place] M.D.   On: 11/17/2014 07:48   Dg Chest Port 1 View  12/14/2014   CLINICAL DATA:  Respiratory failure.  EXAM: PORTABLE CHEST - 1 VIEW  COMPARISON:  12/13/2014.  FINDINGS: Cardiomegaly with pulmonary interstitial prominence and left pleural effusion again noted. Findings consistent with persistent congestive heart failure. Left base atelectasis and/or infiltrate. Bilateral pleural thickening. This is most consistent scarring. No acute bony abnormality.  IMPRESSION: 1. Persistent changes of persist congestive heart failure with pulmonary interstitial edema and left pleural effusion.  2. Left base atelectasis and/or infiltrate.   Electronically Signed   By: Maisie Fus  Register   On: 12/14/2014 06:59   Dg Chest Port 1 View  12/13/2014   CLINICAL DATA:  Shortness of Breath  EXAM: PORTABLE CHEST - 1 VIEW  COMPARISON:  December 05, 2014  FINDINGS: There is stable cardiomegaly with mild interstitial edema. No airspace consolidation. There is a questionable small left effusion. No adenopathy. There is atherosclerotic change in the aorta. No bone lesions.  IMPRESSION: There is persistent congestive heart failure. No frank airspace consolidation. No new opacity appreciable.   Electronically Signed   By: Bretta Bang III M.D.   On: 12/13/2014 17:01   Dg Chest Portable 1 View  12/04/2014   CLINICAL DATA:  Weakness, recent hospitalization  EXAM: PORTABLE CHEST - 1 VIEW  COMPARISON:  11/24/2014  FINDINGS: Moderate  enlargement of the cardiomediastinal silhouette is noted. Lung volumes are low with marked crowding of the bronchovascular markings but no focal opacity allowing for portable technique. Trace if any pleural fluid. Cardiac leads obscure detail.  IMPRESSION: Low volume exam without focal acute finding. If symptoms persist, consider PA and lateral chest radiographs obtained at full inspiration when the patient is clinically able.   Electronically Signed   By: Christiana Pellant M.D.   On: 12/04/2014 14:27   Dg Chest Portable 1 View  11/24/2014   CLINICAL DATA:  Respiratory failure  EXAM: PORTABLE CHEST - 1 VIEW  COMPARISON:  Portable chest x-ray of November 22, 2014  FINDINGS: The lungs are reasonably well inflated. The interstitial markings are coarse and remain increased bilaterally. The left lower lobe is dense and there is obscuration of the left hemidiaphragm consistent with pleural fluid. The cardiac silhouette is enlarged. The pulmonary vascularity is prominent centrally. There is pleural fluid versus pleural thickening over the superior lateral aspects of both  lungs. The bony thorax exhibits no acute abnormality.  IMPRESSION: There has not been significant change since yesterday's study. CHF with small bilateral pleural effusions and interstitial edema and/or bilateral interstitial pneumonia persists.   Electronically Signed   By: David  [place] M.D.   On: 11/24/2014 07:42   Dg Chest Port 1 View  11/22/2014   CLINICAL DATA:  Edema and shortness of breath; acute on chronic respiratory failure.  EXAM: PORTABLE CHEST - 1 VIEW  COMPARISON:  Portable chest x-ray of July 9th 2016  FINDINGS: The lungs are better inflated today. There remain coarse interstitial opacities bilaterally. The left hemidiaphragm remains obscured. Small bilateral pleural effusions persist. The cardiac silhouette pulmonary vascularity are engorged. The mediastinum is less prominent in width today. The trachea is midline. The bony thorax exhibits  no acute abnormality.  IMPRESSION: There has been mild interval improvement in the appearance of the pulmonary interstitium which may indicate improving edema or pneumonia. There remain small bilateral pleural effusions.   Electronically Signed   By: David  [place] M.D.   On: 11/22/2014 07:44   Dg Chest Port 1 View  11/19/2014   CLINICAL DATA:  Lung disease  EXAM: PORTABLE CHEST - 1 VIEW  COMPARISON:  11/17/2014  FINDINGS: Lungs are very under aerated. Heterogeneous opacities throughout both lungs have increased. The heart is enlarged. No pneumothorax.  IMPRESSION: Worsening patchy bilateral airspace disease.   Electronically Signed   By: Jolaine Click M.D.   On: 11/19/2014 09:35   Dg Chest Port 1 View  11/16/2014   CLINICAL DATA:  Dyspnea.  Interstitial lung disease.  EXAM: PORTABLE CHEST - 1 VIEW  COMPARISON:  Single view of the chest 10/13/2014 and 10/15/2014. CT chest 08/03/2014.  FINDINGS: Heart size is upper normal. Chronic interstitial coarsening does not appear notably changed. There is no evidence of pulmonary edema. No consolidative process pneumothorax is identified.  IMPRESSION: No acute abnormality. Appearance of the chest most consistent with chronic interstitial lung disease.   Electronically Signed   By: Drusilla Kanner M.D.   On: 11/16/2014 17:52   ASSESSMENT AND PLAN:  1. Atrial Fibrillation with RVR - failed DCC. Consult with EP on 12/12/2014, with Dr. Johney Frame saying he would favor rate control. HR has been fairly well controlled over past 24 hours in the 80-90's but some episodes up to 120's. - Pharmacy managing Coumadin. INR 2.62 on 12/16/2014. - Increase Cardizem to 300mg  daily for better rate control - Continue Lopressor to 50 mg BID - Continue Digoxin 0.25mg  daily  2. Chronic Diastolic CHF - Admit weight (and Baseline weight per patient) 264 lbs, but weight on the same day was 299 (more consistent w/ 2016 weights). Weight on 12/16/2014 is 286 lbs.Believe dry weight in the mid  270s. - Continue Lasix 40mg  IV BID since she still has LE edema - with elevated K+, no hemolysis, d/c supp and follow BMET.  3. COPD/ILD - On chronic steroids  4. Hyperlipidemia - Continue Lipitor 10mg  daily     [name] Reichert, MD  12/16/2014  7:21 AM

## 2014-12-16 NOTE — Progress Notes (Signed)
[facility] PROGRESS NOTE  [name] [name] ZOX:[id number] DOB: Jan 02, 1932 DOA: 12/04/2014 PCP: Kerby Nora, MD  Interim summary 81 y.o. female with a past medical history of pulmonary fibrosis/interstitial pneumonitis, diastolic congestive heart failure, chronic hypoxemic respiratory failure, atrial fibrillation on chronic anticoagulation who was recently admitted to the pulmonary critical care medicine service on 11/16/2014 discharged on 11/24/2014 to acute rehabilitation. During the hospitalization she initially presented with acute on chronic respiratory failure with hypoxemia which was believed to be secondary to acute flare up of end-stage interstitial lung disease. She was treated with IV steroids and broad-spectrum IV antimicrobial therapy. He was found to have a parainfluenza viral infection. She showed slow clinical improvement however becoming debilitated from prolonged hospitalization. She was discharged to inpatient rehabilitation on 11/24/2014. Readmitted on 12/04/14 due to sepsis from UTI and A. Fib with RVR. Course complicated with acute on chronic diastolic heart failure and pulmonary edema. Patient Being diuresed currently. PCCM and cardiology on board.  Assessment/Plan: 1. Sepsis due to UTI -Present on admission, evidenced by a white count of 20,000 thousand 500, respiratory 27, heart rate 150s, lactic acid of 4.15. -Source of infection could be urinary tract infection as u/a showed presence of pyuria and bacteria. -Patient showing clinical improvement with a downward trend in her white count to 10,000 from 22,000 on admission. She is afebrile and w/o dysuria -Continue antibiotic therapy now adjusted base on sensitivity and transition to Oral regimen.  -last dose of antibiotics 8-4.   2.  Atrial fibrillation with rapid ventricular response. -I suspect underlying infectious process and advanced lung disease precipitating A. fib with RVR - cardizem change to 300 mg daily and  metoprolol 25 mg BID. Digoxin started 8-03 -CHADVASC score 5 -HR improving.   3.  End-stage interstitial lung disease. -Patient having acute on chronic hypoxemic respiratory failure on admission with an increase in her oxygen requirements having a respiratory rate of 27.  -Chest x-ray repeated on 12/13/2014 demonstrated pulmonary edema; no infiltrates  - continue tapering steroids slowly -Continue with  IV lasix -PCCM sign off.  4.  Chronic anticoagulation. -Pharmacy was consulted for Coumadin management -INR to be adjusted by pharmacy   5.  Acute on Chronic diastolic congestive heart failure -Transthoracic echocardiogram performed on 11/17/2014 showed preserved ejection fraction. -most likely due to A. fib -IV lasix BID and PRN BIPAP for SOB -close follow up of renal function and electrolytes -EP on board and helping controlling A. fib -Cardiology following.  -urine out put: 1.2 L. Weight: 126---129---127-130  6.  Proteus mirabilis and klebsiella Pneumoniae Urinary tract infection -Suspect may be cause of sepsis - patient was on ceftin final day of Abx's 8/4) -no fever and no dysuria   7.  Hypokalemia. -now hyperkalemia. Will discontinue potassium.   8. Thrush  -will continue patient on nystatin and magic mouthwash -improving/resolving  Code Status: Full code Family Communication: sons at bedside.  Disposition Plan: home with home health therapy (patient refusing SNF)  Consultation:  Cardiology   PCCM  EP  Antibiotics:  Vancomycin 7/24>>7/27  Elita Quick 7/24>>7/27  Rocephin 7/27>>7/32  Ceftin 7/31>>8/3  HPI/Subjective: Still on BIPAP this am. Awaiting to be removed. Wants to eat regular food.  Breathing better. Does not wants to go to SNF.   Objective: Filed Vitals:   12/16/14 0410  BP:   Pulse:   Temp: 98.7 F (37.1 C)  Resp:     Intake/Output Summary (Last 24 hours) at 12/16/14 0845 Last data filed at 12/15/14 2358  Gross  per 24 hour  Intake   71.57 ml  Output    150 ml  Net -78.43 ml   Filed Weights   12/14/14 0500 12/15/14 0500 12/16/14 0500  Weight: 129 kg (284 lb 6.3 oz) 127.5 kg (281 lb 1.4 oz) 130.1 kg (286 lb 13.1 oz)    Exam:   General:  .Awake, alert and oriented X 3.   Cardiovascular: Irregular rate and rhythm,  Respiratory:  positive crackles bilaterally,  Abdomen: Obese, soft nontender nondistended  Musculoskeletal: 2+ bilateral extremity pitting edema  Data Reviewed: Basic Metabolic Panel:  Recent Labs Lab 12/10/14 0243  12/12/14 0528 12/13/14 0619 12/14/14 0300 12/15/14 0250 12/16/14 0243  NA 137  < > 138 138 141 139 139  K 4.1  < > 4.7 4.4 4.9 5.4* 5.1  CL 90*  < > 98* 94* 100* 99* 99*  CO2 36*  < > 35* 32 33* 34* 33*  GLUCOSE 140*  < > 108* 92 110* 113* 97  BUN 49*  < > 42* 44* 35* 27* 25*  CREATININE 1.27*  < > 1.27* 1.38* 1.09* 1.16* 1.17*  CALCIUM 8.8*  < > 9.2 9.1 9.0 9.2 9.5  MG 2.1  --   --   --  2.2  --   --   PHOS  --   --   --   --  3.4  --   --   < > = values in this interval not displayed. CBC:  Recent Labs Lab 12/11/14 0239 12/13/14 0619 12/14/14 0300 12/15/14 0250 12/16/14 0243  WBC 15.3* 10.1 9.9 9.4 8.3  HGB 13.5 14.4 13.0 12.8 12.6  HCT 41.9 44.1 40.3 40.8 40.1  MCV 101.9* 103.5* 101.5* 105.2* 103.1*  PLT 90* 92* 104* 103* 111*   BNP (last 3 results)  Recent Labs  10/14/14 0420 11/16/14 1851 12/04/14 1423  BNP 301.7* 160.9* 280.1*    ProBNP (last 3 results)  Recent Labs  02/22/14 0947 02/24/14 0332 03/07/14 0648  PROBNP 2319.0* 2410.0* 437.5    CBG: No results for input(s): GLUCAP in the last 168 hours.  No results found for this or any previous visit (from the past 240 hour(s)).   Studies: No results found.  Scheduled Meds: . allopurinol  300 mg Oral Daily  . atorvastatin  10 mg Oral Daily  . budesonide  0.5 mg Nebulization BID  . cefUROXime  500 mg Oral BID WC  . digoxin  0.25 mg Oral Daily  . diltiazem  300 mg Oral Daily  .  escitalopram  20 mg Oral Daily  . furosemide  40 mg Intravenous BID  . gabapentin  300 mg Oral Daily  . metoprolol tartrate  50 mg Oral BID  . nystatin  5 mL Oral QID  . predniSONE  30 mg Oral Q breakfast  . sodium chloride  3 mL Intravenous Q12H  . sodium chloride  3 mL Intravenous Q12H  . Warfarin - Pharmacist Dosing Inpatient   Does not apply q1800   Continuous Infusions:    Principal Problem:   Sepsis Active Problems:   CKD (chronic kidney disease) stage 3, GFR 30-59 ml/min   Interstitial lung disease   Debilitated   UTI (lower urinary tract infection)   Atrial fibrillation with RVR   FTT (failure to thrive) in adult   Blood poisoning   Weakness   Acute on chronic respiratory failure with hypoxia    Time spent: 40 minutes    Cletis Muma A  [facility]  Pager [phone]. If 7PM-7AM, please contact night-coverage at [email or web address], password [facility] 12/16/2014, 8:45 AM  LOS: 12 days

## 2014-12-16 NOTE — Progress Notes (Signed)
PT states she wears Bipap at home QHS. PT placed on for the evening and tolerating well.

## 2014-12-16 NOTE — Progress Notes (Signed)
Patient placed on BIPAP QHS on 14/6 and 40% O2. Patient tolerating well. RT will continue to monitor as needed.

## 2014-12-17 DIAGNOSIS — I5031 Acute diastolic (congestive) heart failure: Secondary | ICD-10-CM

## 2014-12-17 LAB — CBC
HEMATOCRIT: 39.5 % (ref 36.0–46.0)
HEMOGLOBIN: 12.3 g/dL (ref 12.0–15.0)
MCH: 32.4 pg (ref 26.0–34.0)
MCHC: 31.1 g/dL (ref 30.0–36.0)
MCV: 103.9 fL — AB (ref 78.0–100.0)
Platelets: 117 10*3/uL — ABNORMAL LOW (ref 150–400)
RBC: 3.8 MIL/uL — ABNORMAL LOW (ref 3.87–5.11)
RDW: 16.8 % — ABNORMAL HIGH (ref 11.5–15.5)
WBC: 6.7 10*3/uL (ref 4.0–10.5)

## 2014-12-17 LAB — BASIC METABOLIC PANEL
Anion gap: 9 (ref 5–15)
BUN: 23 mg/dL — AB (ref 6–20)
CO2: 32 mmol/L (ref 22–32)
CREATININE: 1.26 mg/dL — AB (ref 0.44–1.00)
Calcium: 9 mg/dL (ref 8.9–10.3)
Chloride: 94 mmol/L — ABNORMAL LOW (ref 101–111)
GFR calc non Af Amer: 39 mL/min — ABNORMAL LOW (ref >60)
GFR, EST AFRICAN AMERICAN: 45 mL/min — AB (ref >60)
Glucose, Bld: 133 mg/dL — ABNORMAL HIGH (ref 65–99)
Potassium: 4.7 mmol/L (ref 3.5–5.1)
SODIUM: 135 mmol/L (ref 135–145)

## 2014-12-17 LAB — PROTIME-INR
INR: 2.82 — ABNORMAL HIGH (ref 0.00–1.49)
PROTHROMBIN TIME: 29.2 s — AB (ref 11.6–15.2)

## 2014-12-17 MED ORDER — FUROSEMIDE 10 MG/ML IJ SOLN
80.0000 mg | Freq: Two times a day (BID) | INTRAMUSCULAR | Status: DC
  Administered 2014-12-17 – 2014-12-23 (×12): 80 mg via INTRAVENOUS
  Filled 2014-12-17 (×16): qty 8

## 2014-12-17 MED ORDER — WARFARIN 0.5 MG HALF TABLET
0.5000 mg | ORAL_TABLET | Freq: Once | ORAL | Status: AC
  Administered 2014-12-17: 0.5 mg via ORAL
  Filled 2014-12-17: qty 1

## 2014-12-17 MED ORDER — FUROSEMIDE 10 MG/ML IJ SOLN: 40.0000 mg | Freq: Three times a day (TID) | INTRAMUSCULAR | Status: DC

## 2014-12-17 NOTE — Progress Notes (Signed)
ANTICOAGULATION CONSULT NOTE - Follow Up Consult  Pharmacy Consult for warfarin Indication: atrial fibrillation and h/o DVT  Allergies  Allergen Reactions  . Pirfenidone Other (See Comments)    Congestion, breathing diffulty    Patient Measurements: Height:  (162.6 cm) Weight: 290 lb 5.5 oz (131.7 kg) IBW/kg (Calculated) : 54.7  Vital Signs: Temp: 98.7 F (37.1 C) (08/06 0800) Temp Source: Oral (08/06 0800) BP: 126/69 mmHg (08/06 0918) Pulse Rate: 85 (08/06 0921)  Labs:  Recent Labs  12/14/14 1153 12/15/14 0004  12/15/14 0250 12/15/14 0845 12/16/14 0243 12/17/14 0245 12/17/14 0950  HGB  --   --   < > 12.8  --  12.6 12.3  --   HCT  --   --   --  40.8  --  40.1 39.5  --   PLT  --   --   --  103*  --  111* 117*  --   LABPROT  --   --   --  23.1*  --  27.7* 29.2*  --   INR  --   --   --  2.06*  --  2.62* 2.82*  --   HEPARINUNFRC 0.77* 0.81*  --   --  0.55  --   --   --   CREATININE  --   --   --  1.16*  --  1.17*  --  1.26*  < > = values in this interval not displayed.  Estimated Creatinine Clearance: 47.3 mL/min (by C-G formula based on Cr of 1.26).  Assessment: 81 yo f admitted on 7/24 for afib with RVR.  She is on warfarin PTA (2.5 mg daily according to our discharge notes from 7/22). There has been some confusing surrounding her INR goals. The patient states she does not know the goal. Will keep at 2-3 as of right now until we can gather more information.  INR this AM is 2.82, will give her a small dose tonight. CBC stable.  Goal of Therapy:  INR 2-3 Monitor platelets by anticoagulation protocol: Yes   Plan:  Warfarin 0.5 mg x 1 tonight F/u INR in AM Monitor hgb/plts, s/s of bleeding, clinical course  Taylynn Easton L. Roseanne Reno, PharmD Clinical Pharmacy Resident Pager: [phone] 12/17/2014 11:27 AM

## 2014-12-17 NOTE — Progress Notes (Signed)
[facility] PROGRESS NOTE  [name] [name] SEL:[id number] DOB: 19-Jun-1931 DOA: 12/04/2014 PCP: Eliezer Lofts, MD  Interim summary 80 y.o. female with a past medical history of pulmonary fibrosis/interstitial pneumonitis, diastolic congestive heart failure, chronic hypoxemic respiratory failure, atrial fibrillation on chronic anticoagulation who was recently admitted to the pulmonary critical care medicine service on 11/16/2014 discharged on 11/24/2014 to acute rehabilitation. During the hospitalization she initially presented with acute on chronic respiratory failure with hypoxemia which was believed to be secondary to acute flare up of end-stage interstitial lung disease. She was treated with IV steroids and broad-spectrum IV antimicrobial therapy. He was found to have a parainfluenza viral infection. She showed slow clinical improvement however becoming debilitated from prolonged hospitalization. She was discharged to inpatient rehabilitation on 11/24/2014. Readmitted on 12/04/14 due to sepsis from UTI and A. Fib with RVR. Course complicated with acute on chronic diastolic heart failure and pulmonary edema. Patient Being diuresed currently. PCCM and cardiology on board.  Assessment/Plan: 1. Acute on Chronic diastolic congestive heart failure -Transthoracic echocardiogram performed on 11/17/2014 showed preserved ejection fraction. -most likely due to A. fib -close follow up of renal function and electrolytes -EP on board and helping controlling A. fib -Cardiology following.  -urine out put: 1.2 L. Weight: 126---129---127-131---131 Negative 6 L. uirne out put 1.1 L  B-met pending. Follow renal function./  Will increase lasix to 40 mg to TID.  Would ask cardio, should we use metolazone. Awaiting renal function.   2.  Atrial fibrillation with rapid ventricular response. -I suspect underlying infectious process and advanced lung disease precipitating A. fib with RVR - cardizem change to 300  mg daily and metoprolol 25 mg BID. Digoxin started 8-03 -CHADVASC score 5 -HR improving.   3.  End-stage interstitial lung disease. -Patient having acute on chronic hypoxemic respiratory failure on admission with an increase in her oxygen requirements having a respiratory rate of 27.  -Chest x-ray repeated on 12/13/2014 demonstrated pulmonary edema; no infiltrates  - continue tapering steroids slowly -Continue with  IV lasix -PCCM sign off.  4.  Chronic anticoagulation. -Pharmacy was consulted for Coumadin management -INR to be adjusted by pharmacy   5.  Sepsis due to UTI -Present on admission, evidenced by a white count of 20,000 thousand 500, respiratory 27, heart rate 150s, lactic acid of 4.15. -Source of infection could be urinary tract infection as u/a showed presence of pyuria and bacteria. -Patient showing clinical improvement with a downward trend in her white count to 10,000 from 22,000 on admission. She is afebrile and w/o dysuria -Continue antibiotic therapy now adjusted base on sensitivity and transition to Oral regimen.  -last dose of antibiotics 8-4.   6.  Proteus mirabilis and klebsiella Pneumoniae Urinary tract infection -Suspect may be cause of sepsis - patient was on ceftin final day of Abx's 8/4) -no fever and no dysuria   7.  Hypokalemia. -now hyperkalemia.  Potassium disocntinue.   8. Thrush  -will continue patient on nystatin and magic mouthwash -improving/resolving  Code Status: Full code Family Communication: care discussed with patient.  Disposition Plan: home with home health therapy (patient refusing SNF). Need more diuresis.   Consultation:  Cardiology   PCCM  EP  Antibiotics:  Vancomycin 7/24>>7/27  Tressie Ellis 7/24>>7/27  Rocephin 7/27>>7/32  Ceftin 7/31>>8/3  HPI/Subjective: Just transitioning from bed to chair her oxygen decrease to 70 She relates she has too much fluid on her.  Feeling ok, no worsening dypsnea.    Objective: Filed Vitals:   12/17/14  0600  BP: 113/55  Pulse: 78  Temp:   Resp: 26    Intake/Output Summary (Last 24 hours) at 12/17/14 0812 Last data filed at 12/17/14 0350  Gross per 24 hour  Intake    126 ml  Output   1122 ml  Net   -996 ml   Filed Weights   12/16/14 0500 12/16/14 2318 12/17/14 0500  Weight: 130.1 kg (286 lb 13.1 oz) 131.7 kg (290 lb 5.5 oz) 131.7 kg (290 lb 5.5 oz)    Exam:   General:  .Awake, alert and oriented X 3.   Cardiovascular: Irregular rate and rhythm,  Respiratory:  positive crackles bilaterally,  Abdomen: Obese, soft nontender nondistended  Musculoskeletal: 2+ bilateral extremity pitting edema  Data Reviewed: Basic Metabolic Panel:  Recent Labs Lab 12/12/14 0528 12/13/14 0619 12/14/14 0300 12/15/14 0250 12/16/14 0243  NA 138 138 141 139 139  K 4.7 4.4 4.9 5.4* 5.1  CL 98* 94* 100* 99* 99*  CO2 35* 32 33* 34* 33*  GLUCOSE 108* 92 110* 113* 97  BUN 42* 44* 35* 27* 25*  CREATININE 1.27* 1.38* 1.09* 1.16* 1.17*  CALCIUM 9.2 9.1 9.0 9.2 9.5  MG  --   --  2.2  --   --   PHOS  --   --  3.4  --   --    CBC:  Recent Labs Lab 12/13/14 0619 12/14/14 0300 12/15/14 0250 12/16/14 0243 12/17/14 0245  WBC 10.1 9.9 9.4 8.3 6.7  HGB 14.4 13.0 12.8 12.6 12.3  HCT 44.1 40.3 40.8 40.1 39.5  MCV 103.5* 101.5* 105.2* 103.1* 103.9*  PLT 92* 104* 103* 111* 117*   BNP (last 3 results)  Recent Labs  10/14/14 0420 11/16/14 1851 12/04/14 1423  BNP 301.7* 160.9* 280.1*    ProBNP (last 3 results)  Recent Labs  02/22/14 0947 02/24/14 0332 03/07/14 0648  PROBNP 2319.0* 2410.0* 437.5    CBG: No results for input(s): GLUCAP in the last 168 hours.  No results found for this or any previous visit (from the past 240 hour(s)).   Studies: No results found.  Scheduled Meds: . allopurinol  300 mg Oral Daily  . atorvastatin  10 mg Oral Daily  . budesonide  0.5 mg Nebulization BID  . digoxin  0.25 mg Oral Daily  . diltiazem   300 mg Oral Daily  . escitalopram  20 mg Oral Daily  . furosemide  40 mg Intravenous BID  . gabapentin  300 mg Oral Daily  . metoprolol tartrate  50 mg Oral BID  . nystatin  5 mL Oral QID  . predniSONE  30 mg Oral Q breakfast  . sodium chloride  3 mL Intravenous Q12H  . sodium chloride  3 mL Intravenous Q12H  . Warfarin - Pharmacist Dosing Inpatient   Does not apply q1800   Continuous Infusions:    Principal Problem:   Sepsis Active Problems:   CKD (chronic kidney disease) stage 3, GFR 30-59 ml/min   Interstitial lung disease   Debilitated   UTI (lower urinary tract infection)   Atrial fibrillation with RVR   FTT (failure to thrive) in adult   Blood poisoning   Weakness   Acute on chronic respiratory failure with hypoxia    Time spent: 30 minutes    Aurore Redinger [place], [facility] Pager [phone]. If 7PM-7AM, please contact night-coverage at [email or web address], password [facility] 12/17/2014, 8:12 AM  LOS: 13 days

## 2014-12-17 NOTE — Progress Notes (Signed)
Patient placed on BIPAP QHS 14/6 and 40% O2. Pateint is tolerating well sat 94%. RT will continue to monitor as needed.

## 2014-12-17 NOTE — Progress Notes (Signed)
Pt taken off Bipap and placed on 6L [place], tolerating well. Sat 96%

## 2014-12-17 NOTE — Progress Notes (Signed)
SUBJECTIVE:  Complains of increasing LE edema  OBJECTIVE:   Vitals:   Filed Vitals:   12/17/14 0800 12/17/14 0806 12/17/14 0918 12/17/14 0921  BP: 126/69  126/69   Pulse: 78  85 85  Temp: 98.7 F (37.1 C)     TempSrc: Oral     Resp: 32     Height:      Weight:      SpO2: 96% 96%     I&O's:   Intake/Output Summary (Last 24 hours) at 12/17/14 1251 Last data filed at 12/17/14 0920  Gross per 24 hour  Intake    609 ml  Output   1271 ml  Net   -662 ml   TELEMETRY: Reviewed telemetry pt in atrial fibrillation with CVR:     PHYSICAL EXAM General: Well developed, well nourished, in no acute distress Head: Eyes PERRLA, No xanthomas.   Normal cephalic and atramatic  Lungs:   Clear bilaterally to auscultation and percussion. Heart:   Irregularly irregular S1 S2 Pulses are 2+ & equal. Abdomen: Bowel sounds are positive, abdomen soft and non-tender without masses Extremities:   No clubbing, cyanosis  DP +1.  2+ pitting edema up to knees Neuro: Alert and oriented X 3. Psych:  Good affect, responds appropriately   LABS: Basic Metabolic Panel:  Recent Labs  16/10/96 0243 12/17/14 0950  NA 139 135  K 5.1 4.7  CL 99* 94*  CO2 33* 32  GLUCOSE 97 133*  BUN 25* 23*  CREATININE 1.17* 1.26*  CALCIUM 9.5 9.0   Liver Function Tests: No results for input(s): AST, ALT, ALKPHOS, BILITOT, PROT, ALBUMIN in the last 72 hours. No results for input(s): LIPASE, AMYLASE in the last 72 hours. CBC:  Recent Labs  12/16/14 0243 12/17/14 0245  WBC 8.3 6.7  HGB 12.6 12.3  HCT 40.1 39.5  MCV 103.1* 103.9*  PLT 111* 117*   Cardiac Enzymes: No results for input(s): CKTOTAL, CKMB, CKMBINDEX, TROPONINI in the last 72 hours. BNP: Invalid input(s): POCBNP D-Dimer: No results for input(s): DDIMER in the last 72 hours. Hemoglobin A1C: No results for input(s): HGBA1C in the last 72 hours. Fasting Lipid Panel: No results for input(s): CHOL, HDL, LDLCALC, TRIG, CHOLHDL, LDLDIRECT in  the last 72 hours. Thyroid Function Tests: No results for input(s): TSH, T4TOTAL, T3FREE, THYROIDAB in the last 72 hours.  Invalid input(s): FREET3 Anemia Panel: No results for input(s): VITAMINB12, FOLATE, FERRITIN, TIBC, IRON, RETICCTPCT in the last 72 hours. Coag Panel:   Lab Results  Component Value Date   INR 2.82* 12/17/2014   INR 2.62* 12/16/2014   INR 2.06* 12/15/2014    RADIOLOGY: Dg Chest 2 View  12/05/2014   CLINICAL DATA:  Sepsis, CHF, interstitial lung disease, chronic renal insufficiency, failure to thrive.  EXAM: CHEST  2 VIEW  COMPARISON:  Portable chest x-ray of December 04, 2014  FINDINGS: The lungs are borderline hypoinflated especially on the right. This is stable. The interstitial markings remain coarse. There is pleural fluid versus pleural thickening in the apices and along the right lateral thoracic wall which is also stable. The lateral aspect of the left hemidiaphragm remains obscured. The cardiac silhouette is enlarged. The pulmonary vascularity is engorged. The bony thorax exhibits no acute abnormality. There is moderate degenerative disc disease of the thoracic spine.  IMPRESSION: CHF with mild pulmonary interstitial edema. Biapical pleural fluid or pleural thickening. Left lower lobe atelectasis or pneumonia laterally, stable.   Electronically Signed   By: David  [place]  M.D.   On: 12/05/2014 07:50   Dg Chest Port 1 View  12/14/2014   CLINICAL DATA:  Respiratory failure.  EXAM: PORTABLE CHEST - 1 VIEW  COMPARISON:  12/13/2014.  FINDINGS: Cardiomegaly with pulmonary interstitial prominence and left pleural effusion again noted. Findings consistent with persistent congestive heart failure. Left base atelectasis and/or infiltrate. Bilateral pleural thickening. This is most consistent scarring. No acute bony abnormality.  IMPRESSION: 1. Persistent changes of persist congestive heart failure with pulmonary interstitial edema and left pleural effusion.  2. Left base atelectasis  and/or infiltrate.   Electronically Signed   By: Maisie Fus  Register   On: 12/14/2014 06:59   Dg Chest Port 1 View  12/13/2014   CLINICAL DATA:  Shortness of Breath  EXAM: PORTABLE CHEST - 1 VIEW  COMPARISON:  December 05, 2014  FINDINGS: There is stable cardiomegaly with mild interstitial edema. No airspace consolidation. There is a questionable small left effusion. No adenopathy. There is atherosclerotic change in the aorta. No bone lesions.  IMPRESSION: There is persistent congestive heart failure. No frank airspace consolidation. No new opacity appreciable.   Electronically Signed   By: Bretta Bang III M.D.   On: 12/13/2014 17:01   Dg Chest Portable 1 View  12/04/2014   CLINICAL DATA:  Weakness, recent hospitalization  EXAM: PORTABLE CHEST - 1 VIEW  COMPARISON:  11/24/2014  FINDINGS: Moderate enlargement of the cardiomediastinal silhouette is noted. Lung volumes are low with marked crowding of the bronchovascular markings but no focal opacity allowing for portable technique. Trace if any pleural fluid. Cardiac leads obscure detail.  IMPRESSION: Low volume exam without focal acute finding. If symptoms persist, consider PA and lateral chest radiographs obtained at full inspiration when the patient is clinically able.   Electronically Signed   By: Christiana Pellant M.D.   On: 12/04/2014 14:27   Dg Chest Portable 1 View  11/24/2014   CLINICAL DATA:  Respiratory failure  EXAM: PORTABLE CHEST - 1 VIEW  COMPARISON:  Portable chest x-ray of November 22, 2014  FINDINGS: The lungs are reasonably well inflated. The interstitial markings are coarse and remain increased bilaterally. The left lower lobe is dense and there is obscuration of the left hemidiaphragm consistent with pleural fluid. The cardiac silhouette is enlarged. The pulmonary vascularity is prominent centrally. There is pleural fluid versus pleural thickening over the superior lateral aspects of both lungs. The bony thorax exhibits no acute abnormality.   IMPRESSION: There has not been significant change since yesterday's study. CHF with small bilateral pleural effusions and interstitial edema and/or bilateral interstitial pneumonia persists.   Electronically Signed   By: David  [place] M.D.   On: 11/24/2014 07:42   Dg Chest Port 1 View  11/22/2014   CLINICAL DATA:  Edema and shortness of breath; acute on chronic respiratory failure.  EXAM: PORTABLE CHEST - 1 VIEW  COMPARISON:  Portable chest x-ray of July 9th 2016  FINDINGS: The lungs are better inflated today. There remain coarse interstitial opacities bilaterally. The left hemidiaphragm remains obscured. Small bilateral pleural effusions persist. The cardiac silhouette pulmonary vascularity are engorged. The mediastinum is less prominent in width today. The trachea is midline. The bony thorax exhibits no acute abnormality.  IMPRESSION: There has been mild interval improvement in the appearance of the pulmonary interstitium which may indicate improving edema or pneumonia. There remain small bilateral pleural effusions.   Electronically Signed   By: David  [place] M.D.   On: 11/22/2014 07:44   Dg Chest Port 1  View  11/19/2014   CLINICAL DATA:  Lung disease  EXAM: PORTABLE CHEST - 1 VIEW  COMPARISON:  11/17/2014  FINDINGS: Lungs are very under aerated. Heterogeneous opacities throughout both lungs have increased. The heart is enlarged. No pneumothorax.  IMPRESSION: Worsening patchy bilateral airspace disease.   Electronically Signed   By: Jolaine Click M.D.   On: 11/19/2014 09:35    ASSESSMENT AND PLAN:  1. Atrial Fibrillation with RVR - failed DCC. Consult with EP on 12/12/2014, with Dr. Johney Frame saying he would favor rate control. HR has been fairly well controlled over past 24 hours in the 80-90's - Pharmacy managing Coumadin. INR 2.82 on 12/16/2014. - Continue  Cardizem at 300mg  daily - Continue Lopressor to 50 mg BID - Continue Digoxin 0.25mg  daily  2. Chronic Diastolic CHF - Admit weight (and Baseline  weight per patient) 264 lbs, but weight on the same day was 299 (more consistent w/ 2016 weights). Weight on 12/16/2014 is 286 lbs but today up to 290lb .Believe dry weight in the mid 270s. She is net neg 5.8L. She has 2+ LE edema - Increase Lasix to  80mg  IV BID since she still has LE edema and weight is trending upward - Follow BMET.  3. COPD/ILD - On chronic steroids  4. Hyperlipidemia - Continue Lipitor 10mg  daily   [name] Reichert, MD  12/17/2014  12:51 PM

## 2014-12-18 DIAGNOSIS — N183 Chronic kidney disease, stage 3 (moderate): Secondary | ICD-10-CM

## 2014-12-18 LAB — BASIC METABOLIC PANEL
Anion gap: 8 (ref 5–15)
BUN: 33 mg/dL — ABNORMAL HIGH (ref 6–20)
CO2: 35 mmol/L — ABNORMAL HIGH (ref 22–32)
Calcium: 9.4 mg/dL (ref 8.9–10.3)
Chloride: 96 mmol/L — ABNORMAL LOW (ref 101–111)
Creatinine, Ser: 1.5 mg/dL — ABNORMAL HIGH (ref 0.44–1.00)
GFR calc Af Amer: 36 mL/min — ABNORMAL LOW (ref >60)
GFR calc non Af Amer: 31 mL/min — ABNORMAL LOW (ref >60)
GLUCOSE: 112 mg/dL — AB (ref 65–99)
Potassium: 4.8 mmol/L (ref 3.5–5.1)
SODIUM: 139 mmol/L (ref 135–145)

## 2014-12-18 LAB — CBC
HEMATOCRIT: 40.6 % (ref 36.0–46.0)
HEMOGLOBIN: 12.7 g/dL (ref 12.0–15.0)
MCH: 32.6 pg (ref 26.0–34.0)
MCHC: 31.3 g/dL (ref 30.0–36.0)
MCV: 104.4 fL — ABNORMAL HIGH (ref 78.0–100.0)
PLATELETS: 137 10*3/uL — AB (ref 150–400)
RBC: 3.89 MIL/uL (ref 3.87–5.11)
RDW: 17 % — ABNORMAL HIGH (ref 11.5–15.5)
WBC: 7.2 10*3/uL (ref 4.0–10.5)

## 2014-12-18 LAB — DIGOXIN LEVEL: Digoxin Level: 1.1 ng/mL (ref 0.8–2.0)

## 2014-12-18 LAB — PROTIME-INR
INR: 2.39 — ABNORMAL HIGH (ref 0.00–1.49)
Prothrombin Time: 25.8 seconds — ABNORMAL HIGH (ref 11.6–15.2)

## 2014-12-18 MED ORDER — CHLORHEXIDINE GLUCONATE 0.12% ORAL RINSE (MEDLINE KIT)
15.0000 mL | Freq: Two times a day (BID) | OROMUCOSAL | Status: DC
Start: 2014-12-18 — End: 2014-12-29
  Administered 2014-12-18 – 2014-12-29 (×21): 15 mL via OROMUCOSAL
  Filled 2014-12-18 (×5): qty 15

## 2014-12-18 MED ORDER — CETYLPYRIDINIUM CHLORIDE 0.05 % MT LIQD
7.0000 mL | Freq: Two times a day (BID) | OROMUCOSAL | Status: DC
  Administered 2014-12-18 – 2014-12-29 (×20): 7 mL via OROMUCOSAL

## 2014-12-18 MED ORDER — METOLAZONE 5 MG PO TABS
5.0000 mg | ORAL_TABLET | Freq: Every day | ORAL | Status: DC
  Administered 2014-12-18 – 2014-12-19 (×2): 5 mg via ORAL
  Filled 2014-12-18 (×3): qty 1

## 2014-12-18 MED ORDER — WARFARIN SODIUM 2 MG PO TABS
2.0000 mg | ORAL_TABLET | Freq: Once | ORAL | Status: AC
  Administered 2014-12-18: 2 mg via ORAL
  Filled 2014-12-18: qty 1

## 2014-12-18 NOTE — Progress Notes (Signed)
Patient placed on BIPAP QHS 14/7 and 40% O2. Patient tolerating well. Sat 95%. RT will continue to monitor as needed.

## 2014-12-18 NOTE — Progress Notes (Signed)
[facility] PROGRESS NOTE  [name] [name] ZOX:[id number] DOB: April 25, 1932 DOA: 12/04/2014 PCP: Kerby Nora, MD  Interim summary 81 y.o. female with a past medical history of pulmonary fibrosis/interstitial pneumonitis, diastolic congestive heart failure, chronic hypoxemic respiratory failure, atrial fibrillation on chronic anticoagulation who was recently admitted to the pulmonary critical care medicine service on 11/16/2014 discharged on 11/24/2014 to acute rehabilitation. During the hospitalization she initially presented with acute on chronic respiratory failure with hypoxemia which was believed to be secondary to acute flare up of end-stage interstitial lung disease. She was treated with IV steroids and broad-spectrum IV antimicrobial therapy. He was found to have a parainfluenza viral infection. She showed slow clinical improvement however becoming debilitated from prolonged hospitalization. She was discharged to inpatient rehabilitation on 11/24/2014. Readmitted on 12/04/14 due to sepsis from UTI and A. Fib with RVR. Course complicated with acute on chronic diastolic heart failure and pulmonary edema. Patient Being diuresed currently. PCCM and cardiology on board.  Assessment/Plan: 1. Acute on Chronic diastolic congestive heart failure -Transthoracic echocardiogram performed on 11/17/2014 showed preserved ejection fraction. -most likely due to A. fib -close follow up of renal function and electrolytes -EP on board and helping controlling A. fib -Cardiology following.  -urine out put: 1.5 L. Weight: 126---129---127-131---131---130 Negative 6 L. uirne out put 1.1 L  Lasix increased to 80 mg IV BID on 8-7. Follow  Renal function, mildly increase cr at 1.5. Her Cr two months ago was at 1.5.  Will ask cardio if metolazone need to be resume ?    2.  Atrial fibrillation with rapid ventricular response. -I suspect underlying infectious process and advanced lung disease precipitating A. fib  with RVR - cardizem change to 300 mg daily and metoprolol 25 mg BID. Digoxin started 8-03 -CHADVASC score 5 -HR stable.   3.  End-stage interstitial lung disease. -Patient having acute on chronic hypoxemic respiratory failure on admission with an increase in her oxygen requirements having a respiratory rate of 27.  -Chest x-ray repeated on 12/13/2014 demonstrated pulmonary edema; no infiltrates  - continue tapering steroids slowly -Continue with  IV lasix -PCCM sign off.  4.  Chronic anticoagulation. -Pharmacy was consulted for Coumadin management -INR to be adjusted by pharmacy   5.  Sepsis due to UTI -Present on admission, evidenced by a white count of 20,000 thousand 500, respiratory 27, heart rate 150s, lactic acid of 4.15. -Source of infection could be urinary tract infection as u/a showed presence of pyuria and bacteria. -Patient showing clinical improvement with a downward trend in her white count to 10,000 from 22,000 on admission. She is afebrile and w/o dysuria -Continue antibiotic therapy now adjusted base on sensitivity and transition to Oral regimen.  -last dose of antibiotics 8-4.   6.  Proteus mirabilis and klebsiella Pneumoniae Urinary tract infection -Suspect may be cause of sepsis - patient was on ceftin final day of Abx's 8/4) -no fever and no dysuria   7.  Hypokalemia. -now hyperkalemia.  Potassium disocntinue.   8. Ginette Pitman  -will continue patient on nystatin and magic mouthwash -improving/resolving  9-CKD, stage III; cr two months ago was at 1.5.  Monitor renal function on lasix.   Code Status: Full code Family Communication: care discussed with patient.  Disposition Plan: home with home health therapy (patient refusing SNF). Need more diuresis.   Consultation:  Cardiology   PCCM  EP  Antibiotics:  Vancomycin 7/24>>7/27  Elita Quick 7/24>>7/27  Rocephin 7/27>>7/32  Ceftin 7/31>>8/3  HPI/Subjective: She is breathing ok,  she wants more fluid  to be removed. Had a BM   Objective: Filed Vitals:   12/18/14 0900  BP: 137/75  Pulse: 99  Temp:   Resp:     Intake/Output Summary (Last 24 hours) at 12/18/14 0929 Last data filed at 12/18/14 0903  Gross per 24 hour  Intake    366 ml  Output   1650 ml  Net  -1284 ml   Filed Weights   12/16/14 2318 12/17/14 0500 12/18/14 0500  Weight: 131.7 kg (290 lb 5.5 oz) 131.7 kg (290 lb 5.5 oz) 130.6 kg (287 lb 14.7 oz)    Exam:   General:  .Awake, alert and oriented X 3.   Cardiovascular: Irregular rate and rhythm,  Respiratory:  Decreased breath sound, bilateral crackles.   Abdomen: Obese, soft nontender nondistended  Musculoskeletal: 2+ bilateral extremity plus 2  edema  Data Reviewed: Basic Metabolic Panel:  Recent Labs Lab 12/14/14 0300 12/15/14 0250 12/16/14 0243 12/17/14 0950 12/18/14 0342  NA 141 139 139 135 139  K 4.9 5.4* 5.1 4.7 4.8  CL 100* 99* 99* 94* 96*  CO2 33* 34* 33* 32 35*  GLUCOSE 110* 113* 97 133* 112*  BUN 35* 27* 25* 23* 33*  CREATININE 1.09* 1.16* 1.17* 1.26* 1.50*  CALCIUM 9.0 9.2 9.5 9.0 9.4  MG 2.2  --   --   --   --   PHOS 3.4  --   --   --   --    CBC:  Recent Labs Lab 12/14/14 0300 12/15/14 0250 12/16/14 0243 12/17/14 0245 12/18/14 0342  WBC 9.9 9.4 8.3 6.7 7.2  HGB 13.0 12.8 12.6 12.3 12.7  HCT 40.3 40.8 40.1 39.5 40.6  MCV 101.5* 105.2* 103.1* 103.9* 104.4*  PLT 104* 103* 111* 117* 137*   BNP (last 3 results)  Recent Labs  10/14/14 0420 11/16/14 1851 12/04/14 1423  BNP 301.7* 160.9* 280.1*    ProBNP (last 3 results)  Recent Labs  02/22/14 0947 02/24/14 0332 03/07/14 0648  PROBNP 2319.0* 2410.0* 437.5    CBG: No results for input(s): GLUCAP in the last 168 hours.  No results found for this or any previous visit (from the past 240 hour(s)).   Studies: No results found.  Scheduled Meds: . allopurinol  300 mg Oral Daily  . antiseptic oral rinse  7 mL Mouth Rinse q12n4p  . atorvastatin  10 mg Oral  Daily  . budesonide  0.5 mg Nebulization BID  . chlorhexidine gluconate  15 mL Mouth Rinse BID  . digoxin  0.25 mg Oral Daily  . diltiazem  300 mg Oral Daily  . escitalopram  20 mg Oral Daily  . furosemide  80 mg Intravenous BID  . gabapentin  300 mg Oral Daily  . metoprolol tartrate  50 mg Oral BID  . nystatin  5 mL Oral QID  . predniSONE  30 mg Oral Q breakfast  . sodium chloride  3 mL Intravenous Q12H  . sodium chloride  3 mL Intravenous Q12H  . Warfarin - Pharmacist Dosing Inpatient   Does not apply q1800   Continuous Infusions:    Principal Problem:   Sepsis Active Problems:   CKD (chronic kidney disease) stage 3, GFR 30-59 ml/min   Interstitial lung disease   Debilitated   UTI (lower urinary tract infection)   Atrial fibrillation with RVR   FTT (failure to thrive) in adult   Blood poisoning   Weakness   Acute on chronic respiratory failure with hypoxia  Acute diastolic CHF (congestive heart failure)    Time spent: 30 minutes    Nahara Dona A  [facility] Pager [phone]. If 7PM-7AM, please contact night-coverage at [email or web address], password [facility] 12/18/2014, 9:29 AM  LOS: 14 days

## 2014-12-18 NOTE — Progress Notes (Addendum)
SUBJECTIVE:  Still complains of LE edema  OBJECTIVE:   Vitals:   Filed Vitals:   12/18/14 0500 12/18/14 0600 12/18/14 0734 12/18/14 0900  BP:  124/64  137/75  Pulse:  135  99  Temp:      TempSrc:      Resp:  20    Height:      Weight: 287 lb 14.7 oz (130.6 kg)     SpO2:  97% 100%    I&O's:   Intake/Output Summary (Last 24 hours) at 12/18/14 1006 Last data filed at 12/18/14 1610  Gross per 24 hour  Intake    366 ml  Output   1650 ml  Net  -1284 ml   TELEMETRY: Reviewed telemetry pt in atrial fibrillation with CVR:     PHYSICAL EXAM General: Well developed, well nourished, in no acute distress Head: Eyes PERRLA, No xanthomas.   Normal cephalic and atramatic  Lungs:   Few crackles at bases Heart:   Irregularly irregular S1 S2 Pulses are 2+ & equal. Abdomen: Bowel sounds are positive, abdomen soft and non-tender without masses \\Extremities :   No clubbing, cyanosis.  DP +1.  2-3+ edema LE Neuro: Alert and oriented X 3. Psych:  Good affect, responds appropriately   LABS: Basic Metabolic Panel:  Recent Labs  96/04/54 0950 12/18/14 0342  NA 135 139  K 4.7 4.8  CL 94* 96*  CO2 32 35*  GLUCOSE 133* 112*  BUN 23* 33*  CREATININE 1.26* 1.50*  CALCIUM 9.0 9.4   Liver Function Tests: No results for input(s): AST, ALT, ALKPHOS, BILITOT, PROT, ALBUMIN in the last 72 hours. No results for input(s): LIPASE, AMYLASE in the last 72 hours. CBC:  Recent Labs  12/17/14 0245 12/18/14 0342  WBC 6.7 7.2  HGB 12.3 12.7  HCT 39.5 40.6  MCV 103.9* 104.4*  PLT 117* 137*   Cardiac Enzymes: No results for input(s): CKTOTAL, CKMB, CKMBINDEX, TROPONINI in the last 72 hours. BNP: Invalid input(s): POCBNP D-Dimer: No results for input(s): DDIMER in the last 72 hours. Hemoglobin A1C: No results for input(s): HGBA1C in the last 72 hours. Fasting Lipid Panel: No results for input(s): CHOL, HDL, LDLCALC, TRIG, CHOLHDL, LDLDIRECT in the last 72 hours. Thyroid Function  Tests: No results for input(s): TSH, T4TOTAL, T3FREE, THYROIDAB in the last 72 hours.  Invalid input(s): FREET3 Anemia Panel: No results for input(s): VITAMINB12, FOLATE, FERRITIN, TIBC, IRON, RETICCTPCT in the last 72 hours. Coag Panel:   Lab Results  Component Value Date   INR 2.39* 12/18/2014   INR 2.82* 12/17/2014   INR 2.62* 12/16/2014    RADIOLOGY: Dg Chest 2 View  12/05/2014   CLINICAL DATA:  Sepsis, CHF, interstitial lung disease, chronic renal insufficiency, failure to thrive.  EXAM: CHEST  2 VIEW  COMPARISON:  Portable chest x-ray of December 04, 2014  FINDINGS: The lungs are borderline hypoinflated especially on the right. This is stable. The interstitial markings remain coarse. There is pleural fluid versus pleural thickening in the apices and along the right lateral thoracic wall which is also stable. The lateral aspect of the left hemidiaphragm remains obscured. The cardiac silhouette is enlarged. The pulmonary vascularity is engorged. The bony thorax exhibits no acute abnormality. There is moderate degenerative disc disease of the thoracic spine.  IMPRESSION: CHF with mild pulmonary interstitial edema. Biapical pleural fluid or pleural thickening. Left lower lobe atelectasis or pneumonia laterally, stable.   Electronically Signed   By: David  [place] M.D.   On:  12/05/2014 07:50   Dg Chest Port 1 View  12/14/2014   CLINICAL DATA:  Respiratory failure.  EXAM: PORTABLE CHEST - 1 VIEW  COMPARISON:  12/13/2014.  FINDINGS: Cardiomegaly with pulmonary interstitial prominence and left pleural effusion again noted. Findings consistent with persistent congestive heart failure. Left base atelectasis and/or infiltrate. Bilateral pleural thickening. This is most consistent scarring. No acute bony abnormality.  IMPRESSION: 1. Persistent changes of persist congestive heart failure with pulmonary interstitial edema and left pleural effusion.  2. Left base atelectasis and/or infiltrate.   Electronically  Signed   By: Maisie Fus  Register   On: 12/14/2014 06:59   Dg Chest Port 1 View  12/13/2014   CLINICAL DATA:  Shortness of Breath  EXAM: PORTABLE CHEST - 1 VIEW  COMPARISON:  December 05, 2014  FINDINGS: There is stable cardiomegaly with mild interstitial edema. No airspace consolidation. There is a questionable small left effusion. No adenopathy. There is atherosclerotic change in the aorta. No bone lesions.  IMPRESSION: There is persistent congestive heart failure. No frank airspace consolidation. No new opacity appreciable.   Electronically Signed   By: Bretta Bang III M.D.   On: 12/13/2014 17:01   Dg Chest Portable 1 View  12/04/2014   CLINICAL DATA:  Weakness, recent hospitalization  EXAM: PORTABLE CHEST - 1 VIEW  COMPARISON:  11/24/2014  FINDINGS: Moderate enlargement of the cardiomediastinal silhouette is noted. Lung volumes are low with marked crowding of the bronchovascular markings but no focal opacity allowing for portable technique. Trace if any pleural fluid. Cardiac leads obscure detail.  IMPRESSION: Low volume exam without focal acute finding. If symptoms persist, consider PA and lateral chest radiographs obtained at full inspiration when the patient is clinically able.   Electronically Signed   By: Christiana Pellant M.D.   On: 12/04/2014 14:27   Dg Chest Portable 1 View  11/24/2014   CLINICAL DATA:  Respiratory failure  EXAM: PORTABLE CHEST - 1 VIEW  COMPARISON:  Portable chest x-ray of November 22, 2014  FINDINGS: The lungs are reasonably well inflated. The interstitial markings are coarse and remain increased bilaterally. The left lower lobe is dense and there is obscuration of the left hemidiaphragm consistent with pleural fluid. The cardiac silhouette is enlarged. The pulmonary vascularity is prominent centrally. There is pleural fluid versus pleural thickening over the superior lateral aspects of both lungs. The bony thorax exhibits no acute abnormality.  IMPRESSION: There has not been  significant change since yesterday's study. CHF with small bilateral pleural effusions and interstitial edema and/or bilateral interstitial pneumonia persists.   Electronically Signed   By: David  [place] M.D.   On: 11/24/2014 07:42   Dg Chest Port 1 View  11/22/2014   CLINICAL DATA:  Edema and shortness of breath; acute on chronic respiratory failure.  EXAM: PORTABLE CHEST - 1 VIEW  COMPARISON:  Portable chest x-ray of July 9th 2016  FINDINGS: The lungs are better inflated today. There remain coarse interstitial opacities bilaterally. The left hemidiaphragm remains obscured. Small bilateral pleural effusions persist. The cardiac silhouette pulmonary vascularity are engorged. The mediastinum is less prominent in width today. The trachea is midline. The bony thorax exhibits no acute abnormality.  IMPRESSION: There has been mild interval improvement in the appearance of the pulmonary interstitium which may indicate improving edema or pneumonia. There remain small bilateral pleural effusions.   Electronically Signed   By: David  [place] M.D.   On: 11/22/2014 07:44   Dg Chest Port 1 View  11/19/2014  CLINICAL DATA:  Lung disease  EXAM: PORTABLE CHEST - 1 VIEW  COMPARISON:  11/17/2014  FINDINGS: Lungs are very under aerated. Heterogeneous opacities throughout both lungs have increased. The heart is enlarged. No pneumothorax.  IMPRESSION: Worsening patchy bilateral airspace disease.   Electronically Signed   By: Jolaine Click M.D.   On: 11/19/2014 09:35    ASSESSMENT AND PLAN:  1. Atrial Fibrillation with RVR - failed DCC. Consult with EP on 12/12/2014, with Dr. Johney Frame saying he would favor rate control. HR has been fairly well controlled over past 24 hours in the 80-90's - Pharmacy managing Coumadin. INR 2.39 on 12/18/2014. - Continue Cardizem at 300mg  daily - Continue Lopressor to 50 mg BID - Continue Digoxin 0.25mg  daily.  Creatinine trending up so will check a Dig level  2. Chronic Diastolic CHF - Admit  weight (and Baseline weight per patient) 264 lbs, but weight on the same day was 299 (more consistent w/ 2016 weights). Weight on 12/16/2014 is 286 lbs but today up to 290lb .Believe dry weight in the mid 270s. She is net neg 5.8L. She has 2+ LE edema. Increased Lasix to 80mg  IV BID yesterday since she still has LE edema and weight was trending upward.  UOP neg 1L yesterday and 7L since admit.  Weight back down to 287 lbs.  Creatinine 1.5 which is stable from 2 months ago.  She still appears markedly volume overloaded.  Agree with restarting home dose of metolazone today. Continue lasix 80mg  IV BID and follow renal function closely. - Follow BMET.  3. COPD/ILD - On chronic steroids  4. Hyperlipidemia - Continue Lipitor 10mg  daily  Quintella Reichert, MD  12/18/2014  10:06 AM

## 2014-12-18 NOTE — Progress Notes (Signed)
ANTICOAGULATION CONSULT NOTE - Follow Up Consult  Pharmacy Consult for warfarin Indication: atrial fibrillation and h/o DVT  Allergies  Allergen Reactions  . Pirfenidone Other (See Comments)    Congestion, breathing diffulty    Patient Measurements: Height:  (162.6 cm) Weight: 287 lb 14.7 oz (130.6 kg) IBW/kg (Calculated) : 54.7  Vital Signs: Temp: 97.5 F (36.4 C) (08/07 0800) Temp Source: Oral (08/07 0800) BP: 137/75 mmHg (08/07 0900) Pulse Rate: 99 (08/07 0900)  Labs:  Recent Labs  12/16/14 0243 12/17/14 0245 12/17/14 0950 12/18/14 0342  HGB 12.6 12.3  --  12.7  HCT 40.1 39.5  --  40.6  PLT 111* 117*  --  137*  LABPROT 27.7* 29.2*  --  25.8*  INR 2.62* 2.82*  --  2.39*  CREATININE 1.17*  --  1.26* 1.50*    Estimated Creatinine Clearance: 39.5 mL/min (by C-G formula based on Cr of 1.5).    Assessment: 81 yo f admitted on 7/24 for afib with RVR. She is on warfarin PTA (2.5 mg daily according to our discharge notes from 7/22). There has been some confusion surrounding her INR goals. The patient states she does not know the goal. Will keep at 2-3 as of right now until we can gather more information. INR this AM is therapeutic 2.39. CBC stable.  Goal of Therapy:  INR 2-3 Monitor platelets by anticoagulation protocol: Yes   Plan:  Warfarin 2 mg x 1 tonight F/u IN in AM Monitor hgb/plts, s/s of bleeding  Siarra Gilkerson L. Roseanne Reno, PharmD Clinical Pharmacy Resident Pager: [phone] 12/18/2014 10:34 AM

## 2014-12-19 ENCOUNTER — Ambulatory Visit: Payer: Medicare Other | Admitting: Pulmonary Disease

## 2014-12-19 DIAGNOSIS — J849 Interstitial pulmonary disease, unspecified: Secondary | ICD-10-CM

## 2014-12-19 DIAGNOSIS — N390 Urinary tract infection, site not specified: Secondary | ICD-10-CM

## 2014-12-19 LAB — CBC
HCT: 41 % (ref 36.0–46.0)
Hemoglobin: 13 g/dL (ref 12.0–15.0)
MCH: 32.7 pg (ref 26.0–34.0)
MCHC: 31.7 g/dL (ref 30.0–36.0)
MCV: 103.3 fL — ABNORMAL HIGH (ref 78.0–100.0)
PLATELETS: 154 10*3/uL (ref 150–400)
RBC: 3.97 MIL/uL (ref 3.87–5.11)
RDW: 17 % — AB (ref 11.5–15.5)
WBC: 7.6 10*3/uL (ref 4.0–10.5)

## 2014-12-19 LAB — BASIC METABOLIC PANEL
ANION GAP: 12 (ref 5–15)
Anion gap: 9 (ref 5–15)
BUN: 37 mg/dL — AB (ref 6–20)
BUN: 32 mg/dL — ABNORMAL HIGH (ref 6–20)
CALCIUM: 9.5 mg/dL (ref 8.9–10.3)
CALCIUM: 9.6 mg/dL (ref 8.9–10.3)
CO2: 38 mmol/L — AB (ref 22–32)
CO2: 36 mmol/L — AB (ref 22–32)
Chloride: 94 mmol/L — ABNORMAL LOW (ref 101–111)
Chloride: 92 mmol/L — ABNORMAL LOW (ref 101–111)
Creatinine, Ser: 1.45 mg/dL — ABNORMAL HIGH (ref 0.44–1.00)
Creatinine, Ser: 1.28 mg/dL — ABNORMAL HIGH (ref 0.44–1.00)
GFR calc Af Amer: 44 mL/min — ABNORMAL LOW (ref >60)
GFR calc non Af Amer: 33 mL/min — ABNORMAL LOW (ref >60)
GFR calc non Af Amer: 38 mL/min — ABNORMAL LOW (ref >60)
GFR, EST AFRICAN AMERICAN: 38 mL/min — AB (ref >60)
Glucose, Bld: 112 mg/dL — ABNORMAL HIGH (ref 65–99)
Glucose, Bld: 153 mg/dL — ABNORMAL HIGH (ref 65–99)
POTASSIUM: 6 mmol/L — AB (ref 3.5–5.1)
Potassium: 4 mmol/L (ref 3.5–5.1)
SODIUM: 141 mmol/L (ref 135–145)
Sodium: 140 mmol/L (ref 135–145)

## 2014-12-19 LAB — PROTIME-INR
INR: 1.94 — AB (ref 0.00–1.49)
PROTHROMBIN TIME: 22 s — AB (ref 11.6–15.2)

## 2014-12-19 MED ORDER — DIGOXIN 125 MCG PO TABS
0.1250 mg | ORAL_TABLET | Freq: Every day | ORAL | Status: DC
  Administered 2014-12-19 – 2014-12-24 (×6): 0.125 mg via ORAL
  Filled 2014-12-19 (×7): qty 1

## 2014-12-19 MED ORDER — WARFARIN SODIUM 2 MG PO TABS
2.0000 mg | ORAL_TABLET | Freq: Once | ORAL | Status: AC
  Administered 2014-12-19: 2 mg via ORAL
  Filled 2014-12-19: qty 1

## 2014-12-19 NOTE — Progress Notes (Signed)
Patient Name: [name] [name] Date of Encounter: 12/19/2014   Primary Cardiologist: Dr. Mariah Milling  Patient Profile: 29-yo female, w/ PMH of interstitial lung disease (on chronic prednisone and home O2), chronic Diastolic HF, and chronic afib (on warfarin) admitted on 12/04/14 for sepsis.Hospitalized 07/06>07/14 w/ resp failure, then in rehab thru 07/22. Lowest wt in July 276 lbs.  SUBJECTIVE: Concerned about lower extremity edema. Denies any chest pain or shortness of breath.  OBJECTIVE Filed Vitals:   12/19/14 0500 12/19/14 0509 12/19/14 0600 12/19/14 0653  BP:  154/65 134/61   Pulse: 83 86 64   Temp:  97 F (36.1 C)    TempSrc:  Axillary    Resp: 33 22 22   Height:      Weight:    287 lb 11.2 oz (130.5 kg)  SpO2: 88% 98% 97%     Intake/Output Summary (Last 24 hours) at 12/19/14 0703 Last data filed at 12/19/14 0511  Gross per 24 hour  Intake    606 ml  Output   2400 ml  Net  -1794 ml   Filed Weights   12/17/14 0500 12/18/14 0500 12/19/14 0653  Weight: 290 lb 5.5 oz (131.7 kg) 287 lb 14.7 oz (130.6 kg) 287 lb 11.2 oz (130.5 kg)    PHYSICAL EXAM General: Obese female in no acute distress.  Head: Normocephalic, atraumatic.  Neck: Supple without bruits, Unable to assess JVD due to body habitus. Lungs:  Resp regular and unlabored, Few crackles at bases bilaterally. Heart: Irreg irreg, S1, S2, no S3, S4, or murmur; no rub. Abdomen: Soft, non-tender, non-distended, BS + x 4.  Extremities: No clubbing, no cyanosis, 2+ edema bilaterally to mid-tibia.  Neuro: Alert and oriented X 3. Moves all extremities spontaneously. Psych: Normal affect.  LABS: CBC: Recent Labs  12/18/14 0342 12/19/14 0256  WBC 7.2 7.6  HGB 12.7 13.0  HCT 40.6 41.0  MCV 104.4* 103.3*  PLT 137* 154   INR: Recent Labs  12/19/14 0256  INR 1.94*   Basic Metabolic Panel: Recent Labs  12/18/14 0342 12/19/14 0256  NA 139 141  K 4.8 6.0*  CL 96* 94*  CO2 35* 38*  GLUCOSE 112* 112*  BUN  33* 37*  CREATININE 1.50* 1.45*  CALCIUM 9.4 9.5   BNP:  B NATRIURETIC PEPTIDE  Date/Time Value Ref Range Status  12/04/2014 02:23 PM 280.1* 0.0 - 100.0 pg/mL Final  11/16/2014 06:51 PM 160.9* 0.0 - 100.0 pg/mL Final   Lab Results  Component Value Date   DIGOXIN 1.1 12/18/2014    TELE:  Rate in 70's - 90's. Still in Atrial Fibrillation.      Current Medications:  . allopurinol  300 mg Oral Daily  . antiseptic oral rinse  7 mL Mouth Rinse q12n4p  . atorvastatin  10 mg Oral Daily  . budesonide  0.5 mg Nebulization BID  . chlorhexidine gluconate  15 mL Mouth Rinse BID  . digoxin  0.25 mg Oral Daily  . diltiazem  300 mg Oral Daily  . escitalopram  20 mg Oral Daily  . furosemide  80 mg Intravenous BID  . gabapentin  300 mg Oral Daily  . metolazone  5 mg Oral Daily  . metoprolol tartrate  50 mg Oral BID  . nystatin  5 mL Oral QID  . predniSONE  30 mg Oral Q breakfast  . sodium chloride  3 mL Intravenous Q12H  . sodium chloride  3 mL Intravenous Q12H  . Warfarin - Pharmacist  Dosing Inpatient   Does not apply q1800      ASSESSMENT AND PLAN:  1. Atrial Fibrillation with RVR - failed DCC. Consult with EP on 12/12/2014, with Dr. Johney Frame saying he would favor rate control. HR in 70's - 90's over past 24 hours - Pharmacy managing Coumadin. INR 1.94 on 12/19/2014. - Continue Cardizem at 300mg  daily - Continue Lopressor 50 mg BID - Continue Digoxin 0.25mg  daily. Dig level checked on 12/18/14 and was WNL. Discuss with MD if should decrease dose since GFR is 33.  2. Chronic Diastolic CHF - Admit weight (and Baseline weight per patient) 264 lbs, but weight on the same day was 299 (more consistent w/ 2016 weights). Believe dry weight in the mid 270s. Weight on 12/19/14 is 287 lbs, no change overnight, but down 3 lbs in 48 hr. -1.7L on 12/18/14. - Continue lasix 80mg  IV BID and follow renal function closely.  - Metolazone 5mg  daily started 08/07 - Potassium reported as 6.0 on 12/19/14, but  specimen hemolyzed. recheck BMP. - Will make sure she gets standing weights.  3. COPD/ILD - On chronic steroids  4. Hyperlipidemia - Continue Lipitor 10mg  daily  5. CKD, Stage III - Baseline approximately 1.5 - was below this on admission - follow with diuresis  Otherwise, per IM   Signed, Barrett, Rhonda , PA-C 7:03 AM 12/19/2014   Agree with note by Theodore Demark PA-C  Mult admissions for vol overload DD. Nl LV Fxn. Followed by Dr Mariah Milling. ILD (Never smoked). CAF on coumadin AC. Rate controlled on BB/CCB/Dig. Agree with lowering dig dose. I/O neg 9L over last 2 weeks on IV lasix. Zaroxolyn just added. Still has 2+ pitting edema and Velcro rales. Cont current Rx. Standing wgts.   [name], M.D., FACP, [facility], [facility] [address] [place], [place]  [address]  [phone] 12/19/2014 8:49 AM

## 2014-12-19 NOTE — Progress Notes (Signed)
[facility] PROGRESS NOTE  [name] [name] DJS:[id number] DOB: 1932/01/18 DOA: 12/04/2014 PCP: Eliezer Lofts, MD  Interim summary 81 y.o. female with a past medical history of pulmonary fibrosis/interstitial pneumonitis, diastolic congestive heart failure, chronic hypoxemic respiratory failure, atrial fibrillation on chronic anticoagulation who was recently admitted to the pulmonary critical care medicine service on 11/16/2014 discharged on 11/24/2014 to acute rehabilitation. During the hospitalization she initially presented with acute on chronic respiratory failure with hypoxemia which was believed to be secondary to acute flare up of end-stage interstitial lung disease. She was treated with IV steroids and broad-spectrum IV antimicrobial therapy. He was found to have a parainfluenza viral infection. She showed slow clinical improvement however becoming debilitated from prolonged hospitalization. She was discharged to inpatient rehabilitation on 11/24/2014. Readmitted on 12/04/14 due to sepsis from UTI and A. Fib with RVR. Course complicated with acute on chronic diastolic heart failure and pulmonary edema. Patient Being diuresed currently. PCCM and cardiology on board.  Assessment/Plan: 1. Acute on Chronic diastolic congestive heart failure -Transthoracic echocardiogram performed on 11/17/2014 showed preserved ejection fraction. -most likely due to A. fib -close follow up of renal function and electrolytes -EP on board and helping controlling A. fib -Cardiology following.  -Weight: 126---129---127-131---131---130 Negative 8 L. uirne out put 2.4 L on 8-7 Lasix increased to 80 mg IV BID on 8-7.  Metolazone started 8-7.  2.  Atrial fibrillation with rapid ventricular response. -I suspect underlying infectious process and advanced lung disease precipitating A. fib with RVR - cardizem change to 300 mg daily and metoprolol 25 mg BID. Digoxin started 8-03 -CHADVASC score 5 -HR stable.   3.   End-stage interstitial lung disease. -Patient having acute on chronic hypoxemic respiratory failure on admission with an increase in her oxygen requirements having a respiratory rate of 27.  -Chest x-ray repeated on 12/13/2014 demonstrated pulmonary edema; no infiltrates  - continue tapering steroids slowly -Continue with  IV lasix -PCCM sign off.  4.  Chronic anticoagulation. -Pharmacy was consulted for Coumadin management -INR to be adjusted by pharmacy   5.  Sepsis due to UTI -Present on admission, evidenced by a white count of 20,000 thousand 500, respiratory 27, heart rate 150s, lactic acid of 4.15. -Source of infection could be urinary tract infection as u/a showed presence of pyuria and bacteria. -Patient showing clinical improvement with a downward trend in her white count to 10,000 from 22,000 on admission. She is afebrile and w/o dysuria -Continue antibiotic therapy now adjusted base on sensitivity and transition to Oral regimen.  -last dose of antibiotics 8-4.   6.  Proteus mirabilis and klebsiella Pneumoniae Urinary tract infection -Suspect may be cause of sepsis - patient was on ceftin final day of Abx's 8/4) -no fever and no dysuria   7.  Hypokalemia. -now hyperkalemia.  Potassium disocntinue.  Hyperkalemia this am. Repeated B-met k normal. Specimen was hemolyzed.   8. Ritta Slot  -will continue patient on nystatin and magic mouthwash -improving/resolving  9-CKD, stage III; cr two months ago was at 1.5.  Monitor renal function on lasix. Renal function stable.   Code Status: Full code Family Communication: care discussed with patient.  Disposition Plan: home with home health therapy (patient refusing SNF). Need more diuresis.   Consultation:  Cardiology   PCCM  EP  Antibiotics:  Vancomycin 7/24>>7/27  Tressie Ellis 7/24>>7/27  Rocephin 7/27>>7/32  Ceftin 7/31>>8/3  HPI/Subjective: Feeling ok, no worsening dyspnea. Her Oxygen sat drops to 70 transitioning  from bed to chair, after a while  recovers.  Complaining of LE edema.   Objective: Filed Vitals:   12/19/14 0911  BP: 135/63  Pulse: 90  Temp:   Resp:     Intake/Output Summary (Last 24 hours) at 12/19/14 0937 Last data filed at 12/19/14 0915  Gross per 24 hour  Intake    243 ml  Output   2200 ml  Net  -1957 ml   Filed Weights   12/17/14 0500 12/18/14 0500 12/19/14 0653  Weight: 131.7 kg (290 lb 5.5 oz) 130.6 kg (287 lb 14.7 oz) 130.5 kg (287 lb 11.2 oz)    Exam:   General:  .Awake, alert and oriented X 3.   Cardiovascular: Irregular rate and rhythm,  Respiratory:  Decreased breath sound, bilateral crackles.   Abdomen: Obese, soft nontender nondistended  Musculoskeletal: 2+ bilateral extremity plus 2  edema  Data Reviewed: Basic Metabolic Panel:  Recent Labs Lab 12/14/14 0300 12/15/14 0250 12/16/14 0243 12/17/14 0950 12/18/14 0342 12/19/14 0256  NA 141 139 139 135 139 141  K 4.9 5.4* 5.1 4.7 4.8 6.0*  CL 100* 99* 99* 94* 96* 94*  CO2 33* 34* 33* 32 35* 38*  GLUCOSE 110* 113* 97 133* 112* 112*  BUN 35* 27* 25* 23* 33* 37*  CREATININE 1.09* 1.16* 1.17* 1.26* 1.50* 1.45*  CALCIUM 9.0 9.2 9.5 9.0 9.4 9.5  MG 2.2  --   --   --   --   --   PHOS 3.4  --   --   --   --   --    CBC:  Recent Labs Lab 12/15/14 0250 12/16/14 0243 12/17/14 0245 12/18/14 0342 12/19/14 0256  WBC 9.4 8.3 6.7 7.2 7.6  HGB 12.8 12.6 12.3 12.7 13.0  HCT 40.8 40.1 39.5 40.6 41.0  MCV 105.2* 103.1* 103.9* 104.4* 103.3*  PLT 103* 111* 117* 137* 154   BNP (last 3 results)  Recent Labs  10/14/14 0420 11/16/14 1851 12/04/14 1423  BNP 301.7* 160.9* 280.1*    ProBNP (last 3 results)  Recent Labs  02/22/14 0947 02/24/14 0332 03/07/14 0648  PROBNP 2319.0* 2410.0* 437.5    CBG: No results for input(s): GLUCAP in the last 168 hours.  No results found for this or any previous visit (from the past 240 hour(s)).   Studies: No results found.  Scheduled Meds: .  allopurinol  300 mg Oral Daily  . antiseptic oral rinse  7 mL Mouth Rinse q12n4p  . atorvastatin  10 mg Oral Daily  . budesonide  0.5 mg Nebulization BID  . chlorhexidine gluconate  15 mL Mouth Rinse BID  . digoxin  0.125 mg Oral Daily  . diltiazem  300 mg Oral Daily  . escitalopram  20 mg Oral Daily  . furosemide  80 mg Intravenous BID  . gabapentin  300 mg Oral Daily  . metolazone  5 mg Oral Daily  . metoprolol tartrate  50 mg Oral BID  . nystatin  5 mL Oral QID  . predniSONE  30 mg Oral Q breakfast  . sodium chloride  3 mL Intravenous Q12H  . sodium chloride  3 mL Intravenous Q12H  . Warfarin - Pharmacist Dosing Inpatient   Does not apply q1800   Continuous Infusions:    Principal Problem:   Sepsis Active Problems:   CKD (chronic kidney disease) stage 3, GFR 30-59 ml/min   Interstitial lung disease   Debilitated   UTI (lower urinary tract infection)   Atrial fibrillation with RVR   FTT (failure to  thrive) in adult   Blood poisoning   Weakness   Acute on chronic respiratory failure with hypoxia   Acute diastolic CHF (congestive heart failure)    Time spent: 30 minutes    Jeffery Bachmeier, [facility] Pager [phone]. If 7PM-7AM, please contact night-coverage at [email or web address], password [facility] 12/19/2014, 9:37 AM  LOS: 15 days

## 2014-12-19 NOTE — Progress Notes (Signed)
ANTICOAGULATION CONSULT NOTE - Follow Up Consult  Pharmacy Consult for Coumadin Indication: atrial fibrillation and DVT(hx of)  Allergies  Allergen Reactions  . Pirfenidone Other (See Comments)    Congestion, breathing diffulty    Patient Measurements: Height:  (162.6 cm) Weight: 287 lb 11.2 oz (130.5 kg) IBW/kg (Calculated) : 54.7 Heparin Dosing Weight:   Vital Signs: Temp: 97.2 F (36.2 C) (08/08 0822) Temp Source: Axillary (08/08 0822) BP: 135/63 mmHg (08/08 0911) Pulse Rate: 90 (08/08 0911)  Labs:  Recent Labs  12/17/14 0245  12/18/14 0342 12/19/14 0256 12/19/14 0910  HGB 12.3  --  12.7 13.0  --   HCT 39.5  --  40.6 41.0  --   PLT 117*  --  137* 154  --   LABPROT 29.2*  --  25.8* 22.0*  --   INR 2.82*  --  2.39* 1.94*  --   CREATININE  --   < > 1.50* 1.45* 1.28*  < > = values in this interval not displayed.  Estimated Creatinine Clearance: 46.3 mL/min (by C-G formula based on Cr of 1.28).   Medications:  Scheduled:  . allopurinol  300 mg Oral Daily  . antiseptic oral rinse  7 mL Mouth Rinse q12n4p  . atorvastatin  10 mg Oral Daily  . budesonide  0.5 mg Nebulization BID  . chlorhexidine gluconate  15 mL Mouth Rinse BID  . digoxin  0.125 mg Oral Daily  . diltiazem  300 mg Oral Daily  . escitalopram  20 mg Oral Daily  . furosemide  80 mg Intravenous BID  . gabapentin  300 mg Oral Daily  . metolazone  5 mg Oral Daily  . metoprolol tartrate  50 mg Oral BID  . nystatin  5 mL Oral QID  . predniSONE  30 mg Oral Q breakfast  . sodium chloride  3 mL Intravenous Q12H  . sodium chloride  3 mL Intravenous Q12H  . Warfarin - Pharmacist Dosing Inpatient   Does not apply q1800    Assessment: 81yo female with AFib and hx VTE.  Per d[facility]w [facility] at [facility] who is the RN dosing this pt's Coumadin per protocol.  Pt with reduced INR goal per Cardiology since Oct 2015; we will continue with this goal.  INR 1.94, Hg & pltc wnl.  No bleeding noted.  Goal  of Therapy:  INR 1.8-2.2 Monitor platelets by anticoagulation protocol: Yes   Plan:  Repeat Coumadin  x 1 Continue daily INR Monitor for s/s of bleeding  Marisue Humble, PharmD Clinical Pharmacist [facility]

## 2014-12-19 NOTE — Progress Notes (Signed)
Physical Therapy Treatment Patient Details Name: [name] [name] MRN: [id number] DOB: 01/01/1932 Today's Date: 12/19/2014    History of Present Illness Patient is an 81 year old female with a past medical history of end-stage pulmonary fibrosis, chronic diastolic congestive heart failure, chronic hypoxemic respiratory failure, atrial fibrillation who was admitted to the medicine service on 12/04/2014 when she presented with complaints of a generalized weakness and functional decline. She had a recent hospitalization for acute on chronic hypoxemic respiratory failure believed to be secondary to acute flareup of end-stage interstitial lung disease and viral infection. She was discharged to inpatient rehabilitation. She was discharged from inpatient rehabilitation on 11/24/2014 and had been doing poorly at home. She was found to be in atrial fibrillation rapid ventricular response, encephalopathic, having sepsis.  Dx wth UTI.     PT Comments    Pt progressing slowly towards physical therapy goals. Pt on 6L/min supplemental O2 throughout session and sats dropped consistently down as low as 66% with seated therapeutic exercise and constant pursed-lip breathing. Did not feel it was safe to progress mobility at this time due to unstable O2 sats. Pt reports feeling very fatigued throughout session. Continues to refuse SNF and states she is planning on hiring 24 hour assistance. Will continue to follow.   Follow Up Recommendations  SNF (SNF recommended, Pt refusing. Will need HHPT. )     Equipment Recommendations  None recommended by PT    Recommendations for Other Services OT consult     Precautions / Restrictions Precautions Precautions: Fall Precaution Comments: monitor O2 sats and HR Restrictions Weight Bearing Restrictions: No    Mobility  Bed Mobility               General bed mobility comments: Pt sitting up in recliner upon PT arrival.   Transfers                 General  transfer comment: Further mobility deferred as pt's O2 sats were dropping down into the high 60's with seated exercise.   Ambulation/Gait                 Stairs            Wheelchair Mobility    Modified Rankin (Stroke Patients Only)       Balance Overall balance assessment: Needs assistance Sitting-balance support: Feet supported;No upper extremity supported Sitting balance-Leahy Scale: Good                              Cognition Arousal/Alertness: Awake/alert Behavior During Therapy: WFL for tasks assessed/performed Overall Cognitive Status: Within Functional Limits for tasks assessed                      Exercises General Exercises - Lower Extremity Ankle Circles/Pumps: 10 reps Quad Sets: 15 reps Gluteal Sets: 15 reps Long Arc Quad: 15 reps Hip ABduction/ADduction: 15 reps    General Comments        Pertinent Vitals/Pain Pain Assessment: No/denies pain    Home Living                      Prior Function            PT Goals (current goals can now be found in the care plan section) Acute Rehab PT Goals Patient Stated Goal: "Get back to caring for myself at home and doing the things that I used to  do around the house." PT Goal Formulation: With patient Time For Goal Achievement: 12/29/14 Potential to Achieve Goals: Fair Progress towards PT goals: Not progressing toward goals - comment    Frequency  Min 3X/week    PT Plan Current plan remains appropriate    Co-evaluation             End of Session Equipment Utilized During Treatment: Oxygen Activity Tolerance: Treatment limited secondary to medical complications (Comment) (Dropping O2 sats) Patient left: in chair;with call bell/phone within reach     Time: 1415-1440 PT Time Calculation (min) (ACUTE ONLY): 25 min  Charges:  $Therapeutic Exercise: 23-37 mins                    G Codes:      Conni Slipper 2015-01-17, 4:50 PM   Conni Slipper, PT,  DPT Acute Rehabilitation Services Pager: [phone]

## 2014-12-19 NOTE — Progress Notes (Signed)
Placed patient on BIPAP for the night.  Patient is tolerating well at this time. 

## 2014-12-19 NOTE — Care Management Important Message (Signed)
Important Message  Patient Details  Name: [name] [name] MRN: [id number] Date of Birth: 11-13-1931   Medicare Important Message Given:  12/19/2014    [name], RN 12/19/2014, 12:36 PM

## 2014-12-20 DIAGNOSIS — R627 Adult failure to thrive: Secondary | ICD-10-CM

## 2014-12-20 LAB — BASIC METABOLIC PANEL
Anion gap: 11 (ref 5–15)
BUN: 49 mg/dL — ABNORMAL HIGH (ref 6–20)
CALCIUM: 9.2 mg/dL (ref 8.9–10.3)
CHLORIDE: 95 mmol/L — AB (ref 101–111)
CO2: 33 mmol/L — ABNORMAL HIGH (ref 22–32)
CREATININE: 1.82 mg/dL — AB (ref 0.44–1.00)
GFR calc non Af Amer: 25 mL/min — ABNORMAL LOW (ref >60)
GFR, EST AFRICAN AMERICAN: 29 mL/min — AB (ref >60)
GLUCOSE: 99 mg/dL (ref 65–99)
Potassium: 3.7 mmol/L (ref 3.5–5.1)
SODIUM: 139 mmol/L (ref 135–145)

## 2014-12-20 LAB — CBC
HEMATOCRIT: 39.7 % (ref 36.0–46.0)
HEMOGLOBIN: 12.7 g/dL (ref 12.0–15.0)
MCH: 32.7 pg (ref 26.0–34.0)
MCHC: 32 g/dL (ref 30.0–36.0)
MCV: 102.3 fL — ABNORMAL HIGH (ref 78.0–100.0)
PLATELETS: 155 10*3/uL (ref 150–400)
RBC: 3.88 MIL/uL (ref 3.87–5.11)
RDW: 17 % — AB (ref 11.5–15.5)
WBC: 7.7 10*3/uL (ref 4.0–10.5)

## 2014-12-20 LAB — PROTIME-INR
INR: 1.59 — AB (ref 0.00–1.49)
Prothrombin Time: 19 seconds — ABNORMAL HIGH (ref 11.6–15.2)

## 2014-12-20 MED ORDER — METOLAZONE 5 MG PO TABS
5.0000 mg | ORAL_TABLET | Freq: Every day | ORAL | Status: DC
  Administered 2014-12-20 – 2014-12-29 (×10): 5 mg via ORAL
  Filled 2014-12-20 (×10): qty 1

## 2014-12-20 MED ORDER — WARFARIN SODIUM 4 MG PO TABS
4.0000 mg | ORAL_TABLET | Freq: Once | ORAL | Status: AC
Start: 2014-12-20 — End: 2014-12-20
  Administered 2014-12-20: 4 mg via ORAL
  Filled 2014-12-20: qty 1

## 2014-12-20 NOTE — Progress Notes (Signed)
[facility] PROGRESS NOTE  [name] [name] ZOX:[id number] DOB: 02-28-1932 DOA: 12/04/2014 PCP: Kerby Nora, MD  Interim summary 81 y.o. female with a past medical history of pulmonary fibrosis/interstitial pneumonitis, diastolic congestive heart failure, chronic hypoxemic respiratory failure, atrial fibrillation on chronic anticoagulation who was recently admitted to the pulmonary critical care medicine service on 11/16/2014 discharged on 11/24/2014 to acute rehabilitation. During the hospitalization she initially presented with acute on chronic respiratory failure with hypoxemia which was believed to be secondary to acute flare up of end-stage interstitial lung disease. She was treated with IV steroids and broad-spectrum IV antimicrobial therapy. He was found to have a parainfluenza viral infection. She showed slow clinical improvement however becoming debilitated from prolonged hospitalization. She was discharged to inpatient rehabilitation on 11/24/2014. Readmitted on 12/04/14 due to sepsis from UTI and A. Fib with RVR. Course complicated with acute on chronic diastolic heart failure and pulmonary edema. Patient Being diuresed currently. PCCM and cardiology on board. She finished treatment for UTI, sepsis.   Assessment/Plan: 1. Acute on Chronic diastolic congestive heart failure -Transthoracic echocardiogram performed on 11/17/2014 showed preserved ejection fraction. -most likely due to A. fib -close follow up of renal function and electrolytes -EP on board and helping controlling A. fib -Cardiology following.  -Weight: 126---129---127-131---131---130---127 Negative 10 L. uirne out put 2.1 L on 8-8 Lasix  80 mg IV BID on 8-7.  Discussed with Dr Allyson Sabal, will continue with Metolazone. Monitor renal function daily. If crease increase to 2. Will need adjust diuretics.   2.  Atrial fibrillation with rapid ventricular response. -I suspect underlying infectious process and advanced lung disease  precipitating A. fib with RVR - cardizem change to 300 mg daily and metoprolol 25 mg BID. Digoxin started 8-03 -CHADVASC score 5 -HR stable.  -INR trending down. Pharmacy to adjust doses of coumadin.   3.  End-stage interstitial lung disease. -Patient having acute on chronic hypoxemic respiratory failure on admission with an increase in her oxygen requirements having a respiratory rate of 27.  -Chest x-ray repeated on 12/13/2014 demonstrated pulmonary edema; no infiltrates  - continue tapering steroids slowly -Continue with  IV lasix -PCCM sign off.   4.  Chronic anticoagulation. -Pharmacy was consulted for Coumadin management -INR to be adjusted by pharmacy   5.  Sepsis due to UTI -Present on admission, evidenced by a white count of 20,000 thousand 500, respiratory 27, heart rate 150s, lactic acid of 4.15. -Source of infection could be urinary tract infection as u/a showed presence of pyuria and bacteria. -Patient showing clinical improvement with a downward trend in her white count to 10,000 from 22,000 on admission. She is afebrile and w/o dysuria -Continue antibiotic therapy now adjusted base on sensitivity and transition to Oral regimen.  -last dose of antibiotics 8-4.   6.  Proteus mirabilis and klebsiella Pneumoniae Urinary tract infection -Suspect may be cause of sepsis - patient was on ceftin final day of Abx's 8/4) -no fever and no dysuria   7.  Hypokalemia. -resolved.   8. Ginette Pitman  -will continue patient on nystatin and magic mouthwash -improving/resolving  9-CKD, stage III; cr two months ago was at 1.5.  Monitor renal function on lasix.   Code Status: Full code Family Communication: care discussed with patient.  Disposition Plan: home with home health therapy (patient refusing SNF). Need more diuresis.   Consultation:  Cardiology   PCCM  EP  Antibiotics:  Vancomycin 7/24>>7/27  Elita Quick 7/24>>7/27  Rocephin 7/27>>7/32  Ceftin  7/31>>8/3  HPI/Subjective:  Her Oxygen sat drops to 60 transitioning from bed to chair, after a while recovers.  Complaining of LE edema.  Feeling ok, no worsening dyspnea.  Objective: Filed Vitals:   12/20/14 0729  BP:   Pulse: 75  Temp:   Resp: 22    Intake/Output Summary (Last 24 hours) at 12/20/14 0817 Last data filed at 12/20/14 0449  Gross per 24 hour  Intake    303 ml  Output   2150 ml  Net  -1847 ml   Filed Weights   12/18/14 0500 12/19/14 0653 12/20/14 0448  Weight: 130.6 kg (287 lb 14.7 oz) 130.5 kg (287 lb 11.2 oz) 127.007 kg (280 lb)    Exam:   General:  .Awake, alert and oriented X 3.   Cardiovascular: Irregular rate and rhythm,  Respiratory:  Decreased breath sound, bilateral crackles.   Abdomen: Obese, soft nontender nondistended  Musculoskeletal: 2+ bilateral extremity plus 2  edema  Data Reviewed: Basic Metabolic Panel:  Recent Labs Lab 12/14/14 0300  12/17/14 0950 12/18/14 0342 12/19/14 0256 12/19/14 0910 12/20/14 0435  NA 141  < > 135 139 141 140 139  K 4.9  < > 4.7 4.8 6.0* 4.0 3.7  CL 100*  < > 94* 96* 94* 92* 95*  CO2 33*  < > 32 35* 38* 36* 33*  GLUCOSE 110*  < > 133* 112* 112* 153* 99  BUN 35*  < > 23* 33* 37* 32* 49*  CREATININE 1.09*  < > 1.26* 1.50* 1.45* 1.28* 1.82*  CALCIUM 9.0  < > 9.0 9.4 9.5 9.6 9.2  MG 2.2  --   --   --   --   --   --   PHOS 3.4  --   --   --   --   --   --   < > = values in this interval not displayed. CBC:  Recent Labs Lab 12/16/14 0243 12/17/14 0245 12/18/14 0342 12/19/14 0256 12/20/14 0435  WBC 8.3 6.7 7.2 7.6 7.7  HGB 12.6 12.3 12.7 13.0 12.7  HCT 40.1 39.5 40.6 41.0 39.7  MCV 103.1* 103.9* 104.4* 103.3* 102.3*  PLT 111* 117* 137* 154 155   BNP (last 3 results)  Recent Labs  10/14/14 0420 11/16/14 1851 12/04/14 1423  BNP 301.7* 160.9* 280.1*    ProBNP (last 3 results)  Recent Labs  02/22/14 0947 02/24/14 0332 03/07/14 0648  PROBNP 2319.0* 2410.0* 437.5    CBG: No  results for input(s): GLUCAP in the last 168 hours.  No results found for this or any previous visit (from the past 240 hour(s)).   Studies: No results found.  Scheduled Meds: . allopurinol  300 mg Oral Daily  . antiseptic oral rinse  7 mL Mouth Rinse q12n4p  . atorvastatin  10 mg Oral Daily  . budesonide  0.5 mg Nebulization BID  . chlorhexidine gluconate  15 mL Mouth Rinse BID  . digoxin  0.125 mg Oral Daily  . diltiazem  300 mg Oral Daily  . escitalopram  20 mg Oral Daily  . furosemide  80 mg Intravenous BID  . gabapentin  300 mg Oral Daily  . metoprolol tartrate  50 mg Oral BID  . nystatin  5 mL Oral QID  . predniSONE  30 mg Oral Q breakfast  . sodium chloride  3 mL Intravenous Q12H  . sodium chloride  3 mL Intravenous Q12H  . Warfarin - Pharmacist Dosing Inpatient   Does not apply q1800   Continuous  Infusions:    Principal Problem:   Sepsis Active Problems:   CKD (chronic kidney disease) stage 3, GFR 30-59 ml/min   Interstitial lung disease   Debilitated   UTI (lower urinary tract infection)   Atrial fibrillation with RVR   FTT (failure to thrive) in adult   Blood poisoning   Weakness   Acute on chronic respiratory failure with hypoxia   Acute diastolic CHF (congestive heart failure)    Time spent: 30 minutes    Amylynn Fano A  [facility] Pager [phone]. If 7PM-7AM, please contact night-coverage at [email or web address], password [facility] 12/20/2014, 8:17 AM  LOS: 16 days

## 2014-12-20 NOTE — Progress Notes (Signed)
ANTICOAGULATION CONSULT NOTE - Follow Up Consult  Pharmacy Consult for Coumadin Indication: atrial fibrillation  Allergies  Allergen Reactions  . Pirfenidone Other (See Comments)    Congestion, breathing diffulty    Patient Measurements: Height:  (162.6 cm) Weight: 280 lb (127.007 kg) IBW/kg (Calculated) : 54.7 Heparin Dosing Weight:   Vital Signs: Temp: 97.4 F (36.3 C) (08/09 0700) Temp Source: Oral (08/09 0700) BP: 138/49 mmHg (08/09 0700) Pulse Rate: 75 (08/09 0729)  Labs:  Recent Labs  12/18/14 0342 12/19/14 0256 12/19/14 0910 12/20/14 0435  HGB 12.7 13.0  --  12.7  HCT 40.6 41.0  --  39.7  PLT 137* 154  --  155  LABPROT 25.8* 22.0*  --  19.0*  INR 2.39* 1.94*  --  1.59*  CREATININE 1.50* 1.45* 1.28* 1.82*    Estimated Creatinine Clearance: 32 mL/min (by C-G formula based on Cr of 1.82).   Medications:  Scheduled:  . allopurinol  300 mg Oral Daily  . antiseptic oral rinse  7 mL Mouth Rinse q12n4p  . atorvastatin  10 mg Oral Daily  . budesonide  0.5 mg Nebulization BID  . chlorhexidine gluconate  15 mL Mouth Rinse BID  . digoxin  0.125 mg Oral Daily  . diltiazem  300 mg Oral Daily  . escitalopram  20 mg Oral Daily  . furosemide  80 mg Intravenous BID  . gabapentin  300 mg Oral Daily  . metolazone  5 mg Oral Daily  . metoprolol tartrate  50 mg Oral BID  . nystatin  5 mL Oral QID  . predniSONE  30 mg Oral Q breakfast  . sodium chloride  3 mL Intravenous Q12H  . sodium chloride  3 mL Intravenous Q12H  . Warfarin - Pharmacist Dosing Inpatient   Does not apply q1800    Assessment: 81yo female with AFib and hx VTE.  INR down this AM.  Hg and pltc wnl.  No bleeding noted.   Goal of Therapy:  INR 1.8-2.2 Monitor platelets by anticoagulation protocol: Yes   Plan:  Coumadin  today Daily INR Monitor for s/s bleeding  Davide Risdon P 12/20/2014,11:37 AM

## 2014-12-20 NOTE — Progress Notes (Signed)
Placed patient on BiPAP for the night.  Patient is tolerating well at this time. 

## 2014-12-20 NOTE — Progress Notes (Signed)
Patient Name: [name] [name] Date of Encounter: 12/20/2014    Primary Cardiologist: Dr. Mariah Milling Patient Profile: 29-yo female, w/ PMH of interstitial lung disease (on chronic prednisone and home O2), chronic Diastolic HF, and chronic afib (on warfarin) admitted on 12/04/14 for sepsis.Hospitalized 07/06>07/14 w/ resp failure, then in rehab thru 07/22. Lowest wt in July 276 lbs.  SUBJECTIVE: Complains of shortness of breath, especially with transferring from bed to her chair. Denies any chest pain or palpitations.  OBJECTIVE Filed Vitals:   12/20/14 0448 12/20/14 0533 12/20/14 0700 12/20/14 0729  BP:   138/49   Pulse:  72 74 75  Temp:      TempSrc:      Resp:  Height:      Weight: 280 lb (127.007 kg)     SpO2:  93% 97% 91%    Intake/Output Summary (Last 24 hours) at 12/20/14 0755 Last data filed at 12/20/14 0449  Gross per 24 hour  Intake    303 ml  Output   2150 ml  Net  -1847 ml   Filed Weights   12/18/14 0500 12/19/14 0653 12/20/14 0448  Weight: 287 lb 14.7 oz (130.6 kg) 287 lb 11.2 oz (130.5 kg) 280 lb (127.007 kg)    PHYSICAL EXAM General: Well developed, well nourished, female in no acute distress. Sitting comfortably in chair. Head: Normocephalic, atraumatic.  Neck: Supple without bruits, JVD not able to be assessed due to body habitus. Lungs:  Resp regular and unlabored, Rales at bases bilaterally on auscultation. Heart: RRR, S1, S2, no S3, S4, or murmur; no rub. Abdomen: Soft, non-tender, non-distended, BS + x 4.  Extremities: No clubbing, no cyanosis, 2+ edema bilaterally.  Neuro: Alert and oriented X 3. Moves all extremities spontaneously. Psych: Normal affect.  LABS: CBC: Recent Labs  12/19/14 0256 12/20/14 0435  WBC 7.6 7.7  HGB 13.0 12.7  HCT 41.0 39.7  MCV 103.3* 102.3*  PLT 154 155   INR: Recent Labs  12/20/14 0435  INR 1.59*   Basic Metabolic Panel: Recent Labs  12/19/14 0910 12/20/14 0435  NA 140 139  K 4.0 3.7  CL  92* 95*  CO2 36* 33*  GLUCOSE 153* 99  BUN 32* 49*  CREATININE 1.28* 1.82*  CALCIUM 9.6 9.2   TELE:   Atrial fibrillation with rates in the 70's to mid-80's.      Current Medications:  . allopurinol  300 mg Oral Daily  . antiseptic oral rinse  7 mL Mouth Rinse q12n4p  . atorvastatin  10 mg Oral Daily  . budesonide  0.5 mg Nebulization BID  . chlorhexidine gluconate  15 mL Mouth Rinse BID  . digoxin  0.125 mg Oral Daily  . diltiazem  300 mg Oral Daily  . escitalopram  20 mg Oral Daily  . furosemide  80 mg Intravenous BID  . gabapentin  300 mg Oral Daily  . metoprolol tartrate  50 mg Oral BID  . nystatin  5 mL Oral QID  . predniSONE  30 mg Oral Q breakfast  . sodium chloride  3 mL Intravenous Q12H  . sodium chloride  3 mL Intravenous Q12H  . Warfarin - Pharmacist Dosing Inpatient   Does not apply q1800      ASSESSMENT AND PLAN: 1. Atrial Fibrillation with RVR - failed DCC. Consult with EP on 12/12/2014, with Dr. Johney Frame saying he would favor rate control. HR in 70's - mid 80's over past 24 hours -  Pharmacy managing Coumadin. INR 1.59 on 12/20/2014. - ContinueCardizem at 300mg  daily - Continue Lopressor 50 mg BID - Continue Digoxin 0.125mg  daily.   2. Chronic Diastolic CHF - Admit weight (and Baseline weight per patient) 264 lbs, but weight on the same day was 299 (more consistent w/ 2016 weights). Believe dry weight in the mid 270s. Weight on 12/20/14 is 280 lbs. -1.8L on 12/19/14. - Continue lasix 80mg  IV BID and follow renal function closely.  - Metolazone 5mg  daily started 08/07 - Please obtain standing weights on the patient for better accurracy.  3. COPD/ILD - On chronic steroids  4. Hyperlipidemia - Continue Lipitor 10mg  daily  5. CKD, Stage III - Baseline approximately 1.5, but below this on admission. Creatinine elevated to 1.83 on 12/20/14. - follow with diuresis  Otherwise, per IM  Signed, Barrett, Rhonda , PA-C 7:55 AM 12/20/2014   Agree with note by Theodore Demark PA-C  Pt still significantly SOB with 2+ pitting edema. I/O suggest -10.7 liters. Pt getting lasix 80 mg IV BID as well as metolazone with SCr rising to 1.8.  Continue current Rx. Follow BMET. May need to change to torsemide PO when transition. Judging from her recent hospitalization trend I suspect it's going to be difficult to keep her out of the hosp for any length of time.  [name], M.D., FACP, [facility], [place] [address] [place], [place]  [address]  [phone] 12/20/2014 8:29 AM

## 2014-12-21 LAB — BASIC METABOLIC PANEL
ANION GAP: 10 (ref 5–15)
BUN: 56 mg/dL — ABNORMAL HIGH (ref 6–20)
CHLORIDE: 91 mmol/L — AB (ref 101–111)
CO2: 38 mmol/L — AB (ref 22–32)
CREATININE: 1.79 mg/dL — AB (ref 0.44–1.00)
Calcium: 9.1 mg/dL (ref 8.9–10.3)
GFR calc non Af Amer: 25 mL/min — ABNORMAL LOW (ref >60)
GFR, EST AFRICAN AMERICAN: 29 mL/min — AB (ref >60)
Glucose, Bld: 98 mg/dL (ref 65–99)
POTASSIUM: 2.8 mmol/L — AB (ref 3.5–5.1)
SODIUM: 139 mmol/L (ref 135–145)

## 2014-12-21 LAB — CBC
HCT: 37.1 % (ref 36.0–46.0)
Hemoglobin: 11.9 g/dL — ABNORMAL LOW (ref 12.0–15.0)
MCH: 32.7 pg (ref 26.0–34.0)
MCHC: 32.1 g/dL (ref 30.0–36.0)
MCV: 101.9 fL — ABNORMAL HIGH (ref 78.0–100.0)
PLATELETS: 175 10*3/uL (ref 150–400)
RBC: 3.64 MIL/uL — ABNORMAL LOW (ref 3.87–5.11)
RDW: 17 % — ABNORMAL HIGH (ref 11.5–15.5)
WBC: 7.1 10*3/uL (ref 4.0–10.5)

## 2014-12-21 LAB — MAGNESIUM: MAGNESIUM: 2.2 mg/dL (ref 1.7–2.4)

## 2014-12-21 LAB — PROTIME-INR
INR: 1.67 — ABNORMAL HIGH (ref 0.00–1.49)
PROTHROMBIN TIME: 19.7 s — AB (ref 11.6–15.2)

## 2014-12-21 MED ORDER — POTASSIUM CHLORIDE CRYS ER 20 MEQ PO TBCR
40.0000 meq | EXTENDED_RELEASE_TABLET | ORAL | Status: AC
  Administered 2014-12-21 (×3): 40 meq via ORAL
  Filled 2014-12-21 (×3): qty 2

## 2014-12-21 MED ORDER — WARFARIN SODIUM 3 MG PO TABS
3.0000 mg | ORAL_TABLET | Freq: Once | ORAL | Status: AC
  Administered 2014-12-21: 3 mg via ORAL
  Filled 2014-12-21: qty 1

## 2014-12-21 NOTE — Progress Notes (Signed)
Patient placed on BIPAP QHS 14/7 and 40%FIO2. Patient tolerating well. Sat 96%. RT will continue to monitor as needed.

## 2014-12-21 NOTE — Progress Notes (Signed)
Physical Therapy Treatment Patient Details Name: [name] [name] MRN: [id number] DOB: 1931-09-02 Today's Date: 12/21/2014    History of Present Illness Patient is an 81 year old female with a past medical history of end-stage pulmonary fibrosis, chronic diastolic congestive heart failure, chronic hypoxemic respiratory failure, atrial fibrillation who was admitted to the medicine service on 12/04/2014 when she presented with complaints of a generalized weakness and functional decline. She had a recent hospitalization for acute on chronic hypoxemic respiratory failure believed to be secondary to acute flareup of end-stage interstitial lung disease and viral infection. She was discharged to inpatient rehabilitation. She was discharged from inpatient rehabilitation on 11/24/2014 and had been doing poorly at home. She was found to be in atrial fibrillation rapid ventricular response, encephalopathic, having sepsis.  Dx wth UTI.     PT Comments    Pt tolerated increased mobility today with O2 sats not dropping below 80%. Pt performed sit to stand x4 as well as SPT and minimal standing activity. Pt encouraged with progress. PT will continue to follow.   Follow Up Recommendations  SNF     Equipment Recommendations  None recommended by PT    Recommendations for Other Services OT consult     Precautions / Restrictions Precautions Precautions: Fall Precaution Comments: monitor O2 sats and HR Restrictions Weight Bearing Restrictions: No    Mobility  Bed Mobility               General bed mobility comments: pt received in chair  Transfers Overall transfer level: Needs assistance Equipment used: Rolling walker (2 wheeled) Transfers: Sit to/from [employer] Sit to Stand: Min guard;Min assist Stand pivot transfers: Min guard       General transfer comment: performed sit to stand 4x and SPT 1x and pt tolerated well. Required min A from recliner, min-guard from firmer  surface of BSC. Pt stood with fwd flexed posture, worked on standing erect. Pt maintained standing 1-2 mins each time.    Ambulation/Gait                 Stairs            Wheelchair Mobility    Modified Rankin (Stroke Patients Only)       Balance Overall balance assessment: Needs assistance Sitting-balance support: No upper extremity supported Sitting balance-Leahy Scale: Good     Standing balance support: Bilateral upper extremity supported Standing balance-Leahy Scale: Poor Standing balance comment: Can let go with one hand for short time but unable to stand without at least unilateral support                    Cognition Arousal/Alertness: Awake/alert Behavior During Therapy: WFL for tasks assessed/performed Overall Cognitive Status: Within Functional Limits for tasks assessed                      Exercises General Exercises - Lower Extremity Ankle Circles/Pumps: 10 reps;Both;Seated Hip Flexion/Marching: Standing;10 reps;Both    General Comments General comments (skin integrity, edema, etc.): O2 sats 80-89% throughout session      Pertinent Vitals/Pain Pain Assessment: No/denies pain    Home Living                      Prior Function            PT Goals (current goals can now be found in the care plan section) Acute Rehab PT Goals Patient Stated Goal: "Get back to caring  for myself at home and doing the things that I used to do around the house." PT Goal Formulation: With patient Time For Goal Achievement: 12/29/14 Potential to Achieve Goals: Fair Progress towards PT goals: Progressing toward goals    Frequency  Min 3X/week    PT Plan Current plan remains appropriate    Co-evaluation             End of Session Equipment Utilized During Treatment: Gait belt;Oxygen Activity Tolerance: Patient tolerated treatment well Patient left: in chair;with call bell/phone within reach     Time: 1610-9604 PT Time  Calculation (min) (ACUTE ONLY): 34 min  Charges:  $Therapeutic Activity: 23-37 mins                    G Codes:     Lyanne Co, PT  Acute Rehab Services  [phone]  Lyanne Co 12/21/2014, 12:24 PM

## 2014-12-21 NOTE — Progress Notes (Signed)
ANTICOAGULATION CONSULT NOTE - Follow Up Consult  Pharmacy Consult for Coumadin Indication: Atrial fibrillation  Allergies  Allergen Reactions  . Pirfenidone Other (See Comments)    Congestion, breathing diffulty    Patient Measurements: Height:  (162.6 cm) Weight: 291 lb 14.2 oz (132.4 kg) IBW/kg (Calculated) : 54.7 Heparin Dosing Weight:   Vital Signs: Temp: 97.2 F (36.2 C) (08/10 0830) Temp Source: Axillary (08/10 0830) BP: 122/61 mmHg (08/10 0843) Pulse Rate: 73 (08/10 0843)  Labs:  Recent Labs  12/19/14 0256 12/19/14 0910 12/20/14 0435 12/21/14 0306  HGB 13.0  --  12.7 11.9*  HCT 41.0  --  39.7 37.1  PLT 154  --  155 175  LABPROT 22.0*  --  19.0* 19.7*  INR 1.94*  --  1.59* 1.67*  CREATININE 1.45* 1.28* 1.82* 1.79*    Estimated Creatinine Clearance: 33.4 mL/min (by C-G formula based on Cr of 1.79).   Medications:  Scheduled:  . allopurinol  300 mg Oral Daily  . antiseptic oral rinse  7 mL Mouth Rinse q12n4p  . atorvastatin  10 mg Oral Daily  . budesonide  0.5 mg Nebulization BID  . chlorhexidine gluconate  15 mL Mouth Rinse BID  . digoxin  0.125 mg Oral Daily  . diltiazem  300 mg Oral Daily  . escitalopram  20 mg Oral Daily  . furosemide  80 mg Intravenous BID  . gabapentin  300 mg Oral Daily  . metolazone  5 mg Oral Daily  . metoprolol tartrate  50 mg Oral BID  . nystatin  5 mL Oral QID  . potassium chloride  40 mEq Oral Q4H  . predniSONE  30 mg Oral Q breakfast  . sodium chloride  3 mL Intravenous Q12H  . sodium chloride  3 mL Intravenous Q12H  . Warfarin - Pharmacist Dosing Inpatient   Does not apply q1800    Assessment: 81yo female with AFib, on Coumadin pta with reduced INR goal per Cardiology.  Hg is down a bit and pltc is wnl.  No bleeding noted.  INR 1.67.  Goal of Therapy:  INR 1.8-2.2 Monitor platelets by anticoagulation protocol: Yes   Plan:  Coumadin  tonight Monitor for s/s of bleeding F/U in AM  Marisue Humble,  PharmD Clinical Pharmacist [facility]

## 2014-12-21 NOTE — Progress Notes (Signed)
Patient Name: [name] [name] Date of Encounter: 12/21/2014  Principal Problem:   Sepsis Active Problems:   CKD (chronic kidney disease) stage 3, GFR 30-59 ml/min   Interstitial lung disease   Debilitated   UTI (lower urinary tract infection)   Atrial fibrillation with RVR   FTT (failure to thrive) in adult   Blood poisoning   Weakness   Acute on chronic respiratory failure with hypoxia   Acute diastolic CHF (congestive heart failure)   Primary Cardiologist: Dr. Mariah Milling  Patient Profile: 6-yo female, w/ PMH of interstitial lung disease (on chronic prednisone and home O2), chronic Diastolic HF, and chronic afib (on warfarin) admitted on 12/04/14 for sepsis.Hospitalized 07/06>07/14 w/ resp failure, then in rehab thru 07/22. Lowest wt in July 276 lbs.  SUBJECTIVE: Reports no improvement in her shortness of breath or edema. Denies any chest pain or palpitations.  OBJECTIVE Filed Vitals:   12/21/14 0022 12/21/14 0307 12/21/14 0400 12/21/14 0443  BP: 119/67  109/59   Pulse: 83 93 88   Temp:   96.5 F (35.8 C)   TempSrc:   Axillary   Resp: 23 23 21    Height:      Weight:    291 lb 14.2 oz (132.4 kg)  SpO2: 96% 96% 94%     Intake/Output Summary (Last 24 hours) at 12/21/14 0748 Last data filed at 12/21/14 0525  Gross per 24 hour  Intake   1080 ml  Output   2800 ml  Net  -1720 ml   Filed Weights   12/19/14 0653 12/20/14 0448 12/21/14 0443  Weight: 287 lb 11.2 oz (130.5 kg) 280 lb (127.007 kg) 291 lb 14.2 oz (132.4 kg)    PHYSICAL EXAM General: Obese, female in no acute distress. Head: Normocephalic, atraumatic.  Neck: Supple without bruits, JVD not able to be assessed due to body habitus. Lungs:  Resp regular and unlabored, Rales at bases bilaterally. Heart: Irregularly irregular, no S3, S4, or murmur; no rub. Abdomen: Soft, non-tender, non-distended, BS + x 4.  Extremities: No clubbing, no cyanosis, 2+ pitting edema bilaterally.  Neuro: Alert and oriented X 3.  Moves all extremities spontaneously. Psych: Normal affect.  LABS: CBC: Recent Labs  12/20/14 0435 12/21/14 0306  WBC 7.7 7.1  HGB 12.7 11.9*  HCT 39.7 37.1  MCV 102.3* 101.9*  PLT 155 175   INR: Recent Labs  12/21/14 0306  INR 1.67*   Basic Metabolic Panel: Recent Labs  12/20/14 0435 12/21/14 0306  NA 139 139  K 3.7 2.8*  CL 95* 91*  CO2 33* 38*  GLUCOSE 99 98  BUN 49* 56*  CREATININE 1.82* 1.79*  CALCIUM 9.2 9.1   BNP:  B NATRIURETIC PEPTIDE  Date/Time Value Ref Range Status  12/04/2014 02:23 PM 280.1* 0.0 - 100.0 pg/mL Final  11/16/2014 06:51 PM 160.9* 0.0 - 100.0 pg/mL Final   TELE:  Atrial fibrillation with rate in 60's - 70's.     Current Medications:  . allopurinol  300 mg Oral Daily  . antiseptic oral rinse  7 mL Mouth Rinse q12n4p  . atorvastatin  10 mg Oral Daily  . budesonide  0.5 mg Nebulization BID  . chlorhexidine gluconate  15 mL Mouth Rinse BID  . digoxin  0.125 mg Oral Daily  . diltiazem  300 mg Oral Daily  . escitalopram  20 mg Oral Daily  . furosemide  80 mg Intravenous BID  . gabapentin  300 mg Oral Daily  . metolazone  5 mg Oral Daily  . metoprolol tartrate  50 mg Oral BID  . nystatin  5 mL Oral QID  . potassium chloride  40 mEq Oral Q4H  . predniSONE  30 mg Oral Q breakfast  . sodium chloride  3 mL Intravenous Q12H  . sodium chloride  3 mL Intravenous Q12H  . Warfarin - Pharmacist Dosing Inpatient   Does not apply q1800      ASSESSMENT AND PLAN:  1. Atrial Fibrillation with RVR - failed DCC. Consult with EP on 12/12/2014, with Dr. Johney Frame saying he would favor rate control. HR in 60's - 70's over past 24 hours - Pharmacy managing Coumadin. INR 1.67 on 12/21/2014. - ContinueCardizem at  daily - Continue Lopressor 50 mg BID - Continue Digoxin 0.125mg  daily.   2. Chronic Diastolic CHF - Admit weight (and Baseline weight per patient) 264 lbs, but weight on 12/20/14 was documented on the same day was 299 (more consistent w/  2016 weights). Believe dry weight in the mid 270s.  - Weight documented as 280 lbs on 12/20/2014 and as 291lbs on 12/21/14. Talked with patient's nurse about obtaining daily standing weights on the patient to improve accuracy. Net -1.7L on 12/20/2014. - Continue Lasix  IV BID and Metolazone  daily. Follow renal function closely.   3. COPD/ILD - On chronic steroids  4. Hyperlipidemia - Continue Lipitor  daily  5. CKD, Stage III - Baseline approximately 1.5, but below this on admission. Creatinine elevated to 1.79 on 12/21/14, but trending down from 1.82 on 12/20/14. - follow with diuresis. Will keep medication doses the same for now.  6. Hypokalemia - Potassium at 2.8 on 12/21/14. - IM has already put in for the patient to receive 3 PO doses of potassium chloride starting this AM. - continue to monitor BMET.   Otherwise, per IM   Signed, Wandra Mannan , PA-C 7:48 AM 12/21/2014   Agree with note by Wandra Mannan PA-C  Pt continues to diurese on lasix 80 mg IV BID and metolazone. Wgts appear to be inaccurate (have requested standing weights). I/O -12 L. SCr slowly increasing. Periph edema slowly improving. Still have a long way to go.  [name], M.D., FACP, [facility], [place] [facility] [address] [name], [place]  [address]  [phone] 12/21/2014 8:57 AM

## 2014-12-21 NOTE — Progress Notes (Addendum)
[facility] PROGRESS NOTE  [name] [name] NWG:[id number] DOB: [birth year] DOA: 12/04/2014 PCP: Kerby Nora, MD  Brief Summary  81 y.o. female with a past medical history of pulmonary fibrosis/interstitial pneumonitis, diastolic congestive heart failure, chronic hypoxemic respiratory failure, atrial fibrillation on chronic anticoagulation who was recently admitted to the pulmonary critical care medicine service on 11/16/2014 discharged on 11/24/2014 to acute rehabilitation. During the hospitalization she initially presented with acute on chronic respiratory failure with hypoxemia which was believed to be secondary to acute flare up of end-stage interstitial lung disease. She was treated with IV steroids and broad-spectrum IV antimicrobial therapy. He was found to have a parainfluenza viral infection. She showed slow clinical improvement however becoming debilitated from prolonged hospitalization. She was discharged to inpatient rehabilitation on 11/24/2014. Readmitted on 12/04/14 due to sepsis from UTI and A. Fib with RVR. Course complicated with acute on chronic diastolic heart failure and pulmonary edema. Patient Being diuresed currently. PCCM and cardiology on board. She finished treatment for UTI, sepsis.   Assessment/Plan  Acute on Chronic diastolic congestive heart failure -Transthoracic echocardiogram performed on 11/17/2014 showed preserved ejection fraction. -most likely due to A. fib -EP on board and helping controlling A. fib -Weights inaccurate - -1.72L yesterday -  Diuretics per cardiology, appreciate assistance -  BUN trending up, need to monitor kidney function carefully while diuresing  Atrial fibrillation with rapid ventricular response on chronic anticoagulation. - I suspect underlying infectious process and advanced lung disease precipitating A. fib with RVR - cardizem change to 300 mg daily and metoprolol 25 mg BID. Digoxin started 8-03 -CHADVASC score 5 -HR stable.   -INR rising. Pharmacy to adjust doses of coumadin.   End-stage interstitial lung disease. -Patient having acute on chronic hypoxemic respiratory failure on admission with an increase in her oxygen requirements having a respiratory rate of 27.  -Chest x-ray repeated on 12/13/2014 demonstrated pulmonary edema; no infiltrates  - continue tapering steroids slowly -PCCM signed off.  Sepsis due to Proteus mirabilis and klebsiella Pneumoniae urinary tract infection, present at time of admission, with leukocytosis and tachycardia and lactic acid 4.15.  Resolved and completed course of antibiotics in hospital.  WBC normal.    Hypokalemia due to diuresis.  Oral and IV potassium repletion today  Thrush, improving, continue patient on nystatin and magic mouthwash  CKD, stage III, creatinine near baseline of 1.5, but BUN rising. -  Monitor renal function on lasix.   Diet:  Healthy heart Access:  Peripheral IV IVF:  Off Proph:  Warfarin  Code Status: Full code, consider palliative care consult for goals of care conversation.  Will discuss with patient tomorrow. Family Communication: Patient alone Disposition Plan: Pending improvement in edema, breathing   Consultation:  Cardiology   PCCM  EP  Antibiotics:  Vancomycin 7/24>>7/27  Elita Quick 7/24>>7/27  Rocephin 7/27>>7/32  Ceftin 7/31>>8/3  HPI/Subjective:  Continues to be very edematous. She is being frequently but her legs and face and abdomen and hands are still very swollen. Her oxygen levels dropped significantly when she moves slightly in her chair or in the bed and particularly when she tries to transfer.  Objective: Filed Vitals:   12/21/14 0842 12/21/14 0843 12/21/14 1000 12/21/14 1247  BP:  122/61 116/59 119/88  Pulse:  73 72 85  Temp:    97.9 F (36.6 C)  TempSrc:    Oral  Resp:  17 22 18   Height:      Weight:      SpO2: 94% 94% 86% 90%  Intake/Output Summary (Last 24 hours) at 12/21/14 1640 Last data  filed at 12/21/14 1400  Gross per 24 hour  Intake    240 ml  Output   2550 ml  Net  -2310 ml   Filed Weights   12/19/14 0653 12/20/14 0448 12/21/14 0443  Weight: 130.5 kg (287 lb 11.2 oz) 127.007 kg (280 lb) 132.4 kg (291 lb 14.2 oz)   Body mass index is 50.08 kg/(m^2).  Exam:   General:  Obese female, No acute distress, nasal cannula in place with 6 L O2  HEENT:  NCAT, MMM, cushingoid faces  Cardiovascular:  IRRR, nl S1, S2 no mrg, 2+ pulses, warm extremities  Respiratory:  Very diminished bilateral breath sounds, no increased WOB  Abdomen:   NABS, soft, NT/ND  MSK:   Normal tone and bulk, 3+ pitting bilateral LEE  Neuro:  Grossly intact  Data Reviewed: Basic Metabolic Panel:  Recent Labs Lab 12/18/14 0342 12/19/14 0256 12/19/14 0910 12/20/14 0435 12/21/14 0306  NA 139 141 140 139 139  K 4.8 6.0* 4.0 3.7 2.8*  CL 96* 94* 92* 95* 91*  CO2 35* 38* 36* 33* 38*  GLUCOSE 112* 112* 153* 99 98  BUN 33* 37* 32* 49* 56*  CREATININE 1.50* 1.45* 1.28* 1.82* 1.79*  CALCIUM 9.4 9.5 9.6 9.2 9.1  MG  --   --   --   --  2.2   Liver Function Tests: No results for input(s): AST, ALT, ALKPHOS, BILITOT, PROT, ALBUMIN in the last 168 hours. No results for input(s): LIPASE, AMYLASE in the last 168 hours. No results for input(s): AMMONIA in the last 168 hours. CBC:  Recent Labs Lab 12/17/14 0245 12/18/14 0342 12/19/14 0256 12/20/14 0435 12/21/14 0306  WBC 6.7 7.2 7.6 7.7 7.1  HGB 12.3 12.7 13.0 12.7 11.9*  HCT 39.5 40.6 41.0 39.7 37.1  MCV 103.9* 104.4* 103.3* 102.3* 101.9*  PLT 117* 137* 154 155 175    No results found for this or any previous visit (from the past 240 hour(s)).   Studies: No results found.  Scheduled Meds: . allopurinol  300 mg Oral Daily  . antiseptic oral rinse  7 mL Mouth Rinse q12n4p  . atorvastatin  10 mg Oral Daily  . budesonide  0.5 mg Nebulization BID  . chlorhexidine gluconate  15 mL Mouth Rinse BID  . digoxin  0.125 mg Oral Daily   . diltiazem  300 mg Oral Daily  . escitalopram  20 mg Oral Daily  . furosemide  80 mg Intravenous BID  . gabapentin  300 mg Oral Daily  . metolazone  5 mg Oral Daily  . metoprolol tartrate  50 mg Oral BID  . nystatin  5 mL Oral QID  . potassium chloride  40 mEq Oral Q4H  . predniSONE  30 mg Oral Q breakfast  . sodium chloride  3 mL Intravenous Q12H  . sodium chloride  3 mL Intravenous Q12H  . warfarin  3 mg Oral ONCE-1800  . Warfarin - Pharmacist Dosing Inpatient   Does not apply q1800   Continuous Infusions:   Principal Problem:   Sepsis Active Problems:   CKD (chronic kidney disease) stage 3, GFR 30-59 ml/min   Interstitial lung disease   Debilitated   UTI (lower urinary tract infection)   Atrial fibrillation with RVR   FTT (failure to thrive) in adult   Blood poisoning   Weakness   Acute on chronic respiratory failure with hypoxia   Acute diastolic CHF (congestive  heart failure)    Time spent: 30 min    Ryzen Deady, [facility]  [facility] Pager [phone]. If 7PM-7AM, please contact night-coverage at [email or web address], password [facility] 12/21/2014, 4:40 PM  LOS: 17 days

## 2014-12-22 LAB — BASIC METABOLIC PANEL
ANION GAP: 12 (ref 5–15)
BUN: 57 mg/dL — ABNORMAL HIGH (ref 6–20)
CALCIUM: 9.3 mg/dL (ref 8.9–10.3)
CO2: 39 mmol/L — AB (ref 22–32)
CREATININE: 1.79 mg/dL — AB (ref 0.44–1.00)
Chloride: 90 mmol/L — ABNORMAL LOW (ref 101–111)
GFR calc Af Amer: 29 mL/min — ABNORMAL LOW (ref >60)
GFR calc non Af Amer: 25 mL/min — ABNORMAL LOW (ref >60)
Glucose, Bld: 96 mg/dL (ref 65–99)
Potassium: 2.9 mmol/L — ABNORMAL LOW (ref 3.5–5.1)
Sodium: 141 mmol/L (ref 135–145)

## 2014-12-22 LAB — PROTIME-INR
INR: 1.87 — ABNORMAL HIGH (ref 0.00–1.49)
Prothrombin Time: 21.4 seconds — ABNORMAL HIGH (ref 11.6–15.2)

## 2014-12-22 LAB — CBC
HCT: 38.1 % (ref 36.0–46.0)
Hemoglobin: 12.3 g/dL (ref 12.0–15.0)
MCH: 33.4 pg (ref 26.0–34.0)
MCHC: 32.3 g/dL (ref 30.0–36.0)
MCV: 103.5 fL — AB (ref 78.0–100.0)
Platelets: 195 10*3/uL (ref 150–400)
RBC: 3.68 MIL/uL — ABNORMAL LOW (ref 3.87–5.11)
RDW: 17.2 % — ABNORMAL HIGH (ref 11.5–15.5)
WBC: 7 10*3/uL (ref 4.0–10.5)

## 2014-12-22 MED ORDER — POTASSIUM CHLORIDE CRYS ER 20 MEQ PO TBCR
40.0000 meq | EXTENDED_RELEASE_TABLET | ORAL | Status: AC
  Administered 2014-12-22 (×3): 40 meq via ORAL
  Filled 2014-12-22 (×3): qty 2

## 2014-12-22 MED ORDER — POTASSIUM CHLORIDE 10 MEQ/100ML IV SOLN
10.0000 meq | INTRAVENOUS | Status: AC
  Administered 2014-12-22 (×2): 10 meq via INTRAVENOUS
  Filled 2014-12-22 (×2): qty 100

## 2014-12-22 MED ORDER — MAGNESIUM CHLORIDE 64 MG PO TBEC
1.0000 | DELAYED_RELEASE_TABLET | Freq: Two times a day (BID) | ORAL | Status: DC
  Administered 2014-12-22 – 2014-12-29 (×15): 64 mg via ORAL
  Filled 2014-12-22 (×18): qty 1

## 2014-12-22 MED ORDER — WARFARIN SODIUM 3 MG PO TABS
3.0000 mg | ORAL_TABLET | Freq: Once | ORAL | Status: AC
Start: 2014-12-22 — End: 2014-12-22
  Administered 2014-12-22: 3 mg via ORAL
  Filled 2014-12-22: qty 1

## 2014-12-22 NOTE — Progress Notes (Signed)
ANTICOAGULATION CONSULT NOTE - Follow Up Consult  Pharmacy Consult for Coumadin Indication: Atrial fibrillation, hx VTE   Allergies  Allergen Reactions  . Pirfenidone Other (See Comments)    Congestion, breathing diffulty   Patient Measurements: Height:  (162.6 cm) Weight: 285 lb 15 oz (129.7 kg) IBW/kg (Calculated) : 54.7  Vital Signs: Temp: 97.2 F (36.2 C) (08/11 0754) Temp Source: Oral (08/11 0754) BP: 109/62 mmHg (08/11 0802) Pulse Rate: 65 (08/11 0802)  Labs:  Recent Labs  12/20/14 0435 12/21/14 0306 12/22/14 0348  HGB 12.7 11.9* 12.3  HCT 39.7 37.1 38.1  PLT 155 175 195  LABPROT 19.0* 19.7* 21.4*  INR 1.59* 1.67* 1.87*  CREATININE 1.82* 1.79* 1.79*    Estimated Creatinine Clearance: 33 mL/min (by C-G formula based on Cr of 1.79).  Assessment: 81yo female with AFib, on Coumadin pta with reduced INR goal per Cardiology. INR is at goal at 1.87, no bleeding noted.   Goal of Therapy:  INR 1.8-2.2 Monitor platelets by anticoagulation protocol: Yes   Plan:  - Repeat warfarin  PO x 1 tonight - Daily INR  Lysle Pearl, PharmD, BCPS Pager # [phone] 12/22/2014 9:47 AM

## 2014-12-22 NOTE — Progress Notes (Signed)
Subjective: + orthopnea, LEE. SOB better right now  Objective: Vital signs in last 24 hours: Temp:  [97.2 F (36.2 C)-98.2 F (36.8 C)] 97.2 F (36.2 C) (08/11 0754) Pulse Rate:  [46-96] 65 (08/11 0802) Resp:  [17-38] 18 (08/11 0802) BP: (103-130)/(44-89) 109/62 mmHg (08/11 0802) SpO2:  [84 %-96 %] 94 % (08/11 0800) FiO2 (%):  [40 %] 40 % (08/10 2332) Weight:  [285 lb 15 oz (129.7 kg)] 285 lb 15 oz (129.7 kg) (08/11 0500) Last BM Date: 12/21/14  Intake/Output from previous day: 08/10 0701 - 08/11 0700 In: -  Out: 2850 [Urine:2850] Intake/Output this shift:    Medications Scheduled Meds: . allopurinol  300 mg Oral Daily  . antiseptic oral rinse  7 mL Mouth Rinse q12n4p  . atorvastatin  10 mg Oral Daily  . budesonide  0.5 mg Nebulization BID  . chlorhexidine gluconate  15 mL Mouth Rinse BID  . digoxin  0.125 mg Oral Daily  . diltiazem  300 mg Oral Daily  . escitalopram  20 mg Oral Daily  . furosemide  80 mg Intravenous BID  . gabapentin  300 mg Oral Daily  . magnesium chloride  1 tablet Oral BID  . metolazone  5 mg Oral Daily  . metoprolol tartrate  50 mg Oral BID  . nystatin  5 mL Oral QID  . potassium chloride  10 mEq Intravenous Q1 Hr x 2  . potassium chloride  40 mEq Oral Q4H  . predniSONE  30 mg Oral Q breakfast  . sodium chloride  3 mL Intravenous Q12H  . sodium chloride  3 mL Intravenous Q12H  . Warfarin - Pharmacist Dosing Inpatient   Does not apply q1800   Continuous Infusions:  PRN Meds:.sodium chloride, acetaminophen **OR** acetaminophen, levalbuterol, magic mouthwash w/lidocaine, ondansetron **OR** ondansetron (ZOFRAN) IV, oxyCODONE, sodium chloride  PE: General appearance: alert, cooperative, no distress and Sitting up in the chair Lungs: Fine crackles, otherwise clear Heart: irregularly irregular rhythm Abdomen:  +BS, nontender, tense Extremities: 3+ LEE Pulses: 2+ and symmetric Skin: Warm and dry Neurologic: Grossly normal  Lab Results:     Recent Labs  12/20/14 0435 12/21/14 0306 12/22/14 0348  WBC 7.7 7.1 7.0  HGB 12.7 11.9* 12.3  HCT 39.7 37.1 38.1  PLT 155 175 195   BMET  Recent Labs  12/20/14 0435 12/21/14 0306 12/22/14 0348  NA 139 139 141  K 3.7 2.8* 2.9*  CL 95* 91* 90*  CO2 33* 38* 39*  GLUCOSE 99 98 96  BUN 49* 56* 57*  CREATININE 1.82* 1.79* 1.79*  CALCIUM 9.2 9.1 9.3   PT/INR  Recent Labs  12/20/14 0435 12/21/14 0306 12/22/14 0348  LABPROT 19.0* 19.7* 21.4*  INR 1.59* 1.67* 1.87*      Assessment/Plan    Principal Problem:   Sepsis Active Problems:   CKD (chronic kidney disease) stage 3, GFR 30-59 ml/min   Interstitial lung disease   Debilitated   UTI (lower urinary tract infection)   Atrial fibrillation with RVR   FTT (failure to thrive) in adult   Blood poisoning   Weakness   Acute on chronic respiratory failure with hypoxia   Acute diastolic CHF (congestive heart failure)  1. Atrial Fibrillation with RVR - failed DCC. Consult with EP on 12/12/2014, with Dr. Johney Frame saying he would favor rate control. HR in 60's - 70's over past 24 hours - Pharmacy managing Coumadin. INR 1.67 on 12/21/2014. - ContinueCardizem at  daily - Continue Lopressor 50  mg BID - Continue Digoxin 0.125mg  daily.   2. Acute on Chronic Diastolic CHF Net fluids: -2.9L/-15.3L.    Admit weight (and Baseline weight per patient) 264 lbs, but weight on 12/20/14 was documented on the same day was 299 (more consistent w/ 2016 weights).   - Wt yesterday 285# today not done yet.    - Continue Lasix 80mg  IV BID and Metolazone 5mg  daily. Follow renal function closely.   3. COPD/ILD - On chronic steroids  4. Hyperlipidemia - Continue Lipitor 10mg  daily  5. CKD, Stage III - Baseline approximately 1.5, but below this on admission. Creatinine stable at 1.79.  - follow with diuresis. Will keep medication doses the same for now.  6. Hypokalemia - Potassium 2.9 - IV K ordered - continue to monitor  BMET.    LOS: 18 days    HAGER, BRYAN PA-C 12/22/2014 8:04 AM    Agree with note written by Jones Skene PAC  Making slow progress. I/O -2.9 L but still has signif pitting edema. On Lasix 80 mg IV BID and metolazone. SCr stable. Replete K. Afib with CVR. ILD on steroids with velcro crackles. Cont current Rx.  Nanetta Batty 12/22/2014 8:44 AM

## 2014-12-22 NOTE — Progress Notes (Signed)
[facility] PROGRESS NOTE  [name] [name] WJX:[id number] DOB: 14-Feb-1932 DOA: 12/04/2014 PCP: Kerby Nora, MD  Brief Summary  81 y.o. female with a past medical history of pulmonary fibrosis/interstitial pneumonitis, diastolic congestive heart failure, chronic hypoxemic respiratory failure, atrial fibrillation on chronic anticoagulation who was recently admitted to the pulmonary critical care medicine service on 11/16/2014 discharged on 11/24/2014 to acute rehabilitation. During the hospitalization she initially presented with acute on chronic respiratory failure with hypoxemia which was believed to be secondary to acute flare up of end-stage interstitial lung disease. She was treated with IV steroids and broad-spectrum IV antimicrobial therapy. He was found to have a parainfluenza viral infection. She showed slow clinical improvement however becoming debilitated from prolonged hospitalization. She was discharged to inpatient rehabilitation on 11/24/2014. Readmitted on 12/04/14 due to sepsis from UTI and A. Fib with RVR. Course complicated with acute on chronic diastolic heart failure and pulmonary edema. Patient Being diuresed currently. PCCM and cardiology on board. She finished treatment for UTI, sepsis.   Assessment/Plan  Acute on Chronic diastolic congestive heart failure, still massively hypervolemic - Transthoracic echocardiogram performed on 11/17/2014 showed preserved ejection fraction. - Most likely due to A. fib - EP on board and helping controlling A. fib - Weights inaccurate - -2.85 L yesterday -  Diuretics per cardiology, appreciate assistance -  BUN trended up but now stable  Atrial fibrillation with rapid ventricular response on chronic anticoagulation, tele:  Rate controlled a-fib - I suspect underlying infectious process and advanced lung disease precipitating A. fib with RVR - cardizem change to 300 mg daily and metoprolol 25 mg BID. Digoxin started 8-03 - CHADVASC  score 5 - INR rising. Pharmacy to adjust doses of coumadin.   End-stage interstitial lung disease. -Patient having acute on chronic hypoxemic respiratory failure on admission with an increase in her oxygen requirements having a respiratory rate of 27.  -Chest x-ray repeated on 12/13/2014 demonstrated pulmonary edema; no infiltrates  - continue tapering steroids slowly, currently on prednisone 30mg  and will be discharged on this dose with follow up with pulmonology -PCCM signed off.  Sepsis due to Proteus mirabilis and klebsiella Pneumoniae urinary tract infection, present at time of admission, with leukocytosis and tachycardia and lactic acid 4.15.  Resolved and completed course of antibiotics in hospital.  WBC normal.    Hypokalemia due to diuresis.  Oral potassium repletion  Thrush, improving, continue patient on nystatin and magic mouthwash  CKD, stage III, creatinine near baseline of 1.5, but BUN rising. -  Monitor renal function on lasix.   Diet:  Healthy heart Access:  Peripheral IV IVF:  Off Proph:  Warfarin  Code Status: Full code.  Patient states she wants to live another 5 years.  Family Communication: Patient alone Disposition Plan: Pending improvement in edema, breathing   Consultation:  Cardiology   PCCM  EP  Antibiotics:  Vancomycin 7/24>>7/27  Elita Quick 7/24>>7/27  Rocephin 7/27>>7/32  Ceftin 7/31>>8/3  HPI/Subjective:  Continues to be very edematous.  She notices no difference in her breathing or in her swelling. Her BiPAP machine did not alarm as much last night.  Objective: Filed Vitals:   12/22/14 0500 12/22/14 0754 12/22/14 0800 12/22/14 0802  BP:    109/62  Pulse:    65  Temp:  97.2 F (36.2 C)    TempSrc:  Oral    Resp:    18  Height:      Weight: 129.7 kg (285 lb 15 oz)     SpO2:  94%     Intake/Output Summary (Last 24 hours) at 12/22/14 0832 Last data filed at 12/22/14 0300  Gross per 24 hour  Intake      0 ml  Output   2850  ml  Net  -2850 ml   Filed Weights   12/20/14 0448 12/21/14 0443 12/22/14 0500  Weight: 127.007 kg (280 lb) 132.4 kg (291 lb 14.2 oz) 129.7 kg (285 lb 15 oz)   Body mass index is 49.06 kg/(m^2).  Exam:   General:  Obese female, No acute distress, nasal cannula in place with 6 L O2  HEENT:  NCAT, MMM, cushingoid faces  Cardiovascular:  IRRR, nl S1, S2 no mrg, 2+ pulses, warm extremities  Respiratory:  Very diminished bilateral breath sounds, rales at the left base, no increased WOB  Abdomen:   NABS, soft, NT/ND  MSK:   Normal tone and bulk, 3+ pitting bilateral LEE  Neuro:  Grossly intact  Data Reviewed: Basic Metabolic Panel:  Recent Labs Lab 12/19/14 0256 12/19/14 0910 12/20/14 0435 12/21/14 0306 12/22/14 0348  NA 141 140 139 139 141  K 6.0* 4.0 3.7 2.8* 2.9*  CL 94* 92* 95* 91* 90*  CO2 38* 36* 33* 38* 39*  GLUCOSE 112* 153* 99 98 96  BUN 37* 32* 49* 56* 57*  CREATININE 1.45* 1.28* 1.82* 1.79* 1.79*  CALCIUM 9.5 9.6 9.2 9.1 9.3  MG  --   --   --  2.2  --    Liver Function Tests: No results for input(s): AST, ALT, ALKPHOS, BILITOT, PROT, ALBUMIN in the last 168 hours. No results for input(s): LIPASE, AMYLASE in the last 168 hours. No results for input(s): AMMONIA in the last 168 hours. CBC:  Recent Labs Lab 12/18/14 0342 12/19/14 0256 12/20/14 0435 12/21/14 0306 12/22/14 0348  WBC 7.2 7.6 7.7 7.1 7.0  HGB 12.7 13.0 12.7 11.9* 12.3  HCT 40.6 41.0 39.7 37.1 38.1  MCV 104.4* 103.3* 102.3* 101.9* 103.5*  PLT 137* 154 155 175 195    No results found for this or any previous visit (from the past 240 hour(s)).   Studies: No results found.  Scheduled Meds: . allopurinol  300 mg Oral Daily  . antiseptic oral rinse  7 mL Mouth Rinse q12n4p  . atorvastatin  10 mg Oral Daily  . budesonide  0.5 mg Nebulization BID  . chlorhexidine gluconate  15 mL Mouth Rinse BID  . digoxin  0.125 mg Oral Daily  . diltiazem  300 mg Oral Daily  . escitalopram  20 mg  Oral Daily  . furosemide  80 mg Intravenous BID  . gabapentin  300 mg Oral Daily  . magnesium chloride  1 tablet Oral BID  . metolazone  5 mg Oral Daily  . metoprolol tartrate  50 mg Oral BID  . nystatin  5 mL Oral QID  . potassium chloride  10 mEq Intravenous Q1 Hr x 2  . potassium chloride  40 mEq Oral Q4H  . predniSONE  30 mg Oral Q breakfast  . sodium chloride  3 mL Intravenous Q12H  . sodium chloride  3 mL Intravenous Q12H  . Warfarin - Pharmacist Dosing Inpatient   Does not apply q1800   Continuous Infusions:   Principal Problem:   Sepsis Active Problems:   CKD (chronic kidney disease) stage 3, GFR 30-59 ml/min   Interstitial lung disease   Debilitated   UTI (lower urinary tract infection)   Atrial fibrillation with RVR   FTT (failure to  thrive) in adult   Blood poisoning   Weakness   Acute on chronic respiratory failure with hypoxia   Acute diastolic CHF (congestive heart failure)    Time spent: 30 min    Ashleigh Arya, [facility]  [facility] Pager [phone]. If 7PM-7AM, please contact night-coverage at [email or web address], password [facility] 12/22/2014, 8:32 AM  LOS: 18 days

## 2014-12-23 DIAGNOSIS — R5381 Other malaise: Secondary | ICD-10-CM

## 2014-12-23 LAB — BASIC METABOLIC PANEL
ANION GAP: 12 (ref 5–15)
BUN: 64 mg/dL — ABNORMAL HIGH (ref 6–20)
CALCIUM: 9.2 mg/dL (ref 8.9–10.3)
CO2: 36 mmol/L — ABNORMAL HIGH (ref 22–32)
Chloride: 92 mmol/L — ABNORMAL LOW (ref 101–111)
Creatinine, Ser: 1.72 mg/dL — ABNORMAL HIGH (ref 0.44–1.00)
GFR calc Af Amer: 31 mL/min — ABNORMAL LOW (ref >60)
GFR, EST NON AFRICAN AMERICAN: 27 mL/min — AB (ref >60)
Glucose, Bld: 97 mg/dL (ref 65–99)
Potassium: 4 mmol/L (ref 3.5–5.1)
SODIUM: 140 mmol/L (ref 135–145)

## 2014-12-23 LAB — PROTIME-INR
INR: 2.15 — ABNORMAL HIGH (ref 0.00–1.49)
PROTHROMBIN TIME: 23.9 s — AB (ref 11.6–15.2)

## 2014-12-23 MED ORDER — POTASSIUM CHLORIDE CRYS ER 20 MEQ PO TBCR
40.0000 meq | EXTENDED_RELEASE_TABLET | Freq: Once | ORAL | Status: AC
  Administered 2014-12-23: 40 meq via ORAL
  Filled 2014-12-23: qty 2

## 2014-12-23 MED ORDER — WARFARIN SODIUM 3 MG PO TABS
3.0000 mg | ORAL_TABLET | Freq: Once | ORAL | Status: AC
  Administered 2014-12-23: 3 mg via ORAL
  Filled 2014-12-23: qty 1

## 2014-12-23 MED ORDER — FUROSEMIDE 10 MG/ML IJ SOLN
80.0000 mg | Freq: Three times a day (TID) | INTRAMUSCULAR | Status: DC
Start: 2014-12-23 — End: 2014-12-24
  Administered 2014-12-23 – 2014-12-24 (×3): 80 mg via INTRAVENOUS
  Filled 2014-12-23 (×4): qty 8

## 2014-12-23 NOTE — Progress Notes (Signed)
[facility] PROGRESS NOTE  [name] [name]:[id number] DOB: [birth year] DOA: 12/04/2014 PCP: Kerby Nora, MD  Brief Summary  81 y.o. female with a past medical history of pulmonary fibrosis/interstitial pneumonitis, diastolic congestive heart failure, chronic hypoxemic respiratory failure, atrial fibrillation on chronic anticoagulation who was recently admitted to the pulmonary critical care medicine service on 11/16/2014 discharged on 11/24/2014 to acute rehabilitation. During the hospitalization she initially presented with acute on chronic respiratory failure with hypoxemia which was believed to be secondary to acute flare up of end-stage interstitial lung disease. She was treated with IV steroids and broad-spectrum IV antimicrobial therapy. He was found to have a parainfluenza viral infection. She showed slow clinical improvement however becoming debilitated from prolonged hospitalization. She was discharged to inpatient rehabilitation on 11/24/2014. Readmitted on 12/04/14 due to sepsis from UTI and A. Fib with RVR. Course complicated with acute on chronic diastolic heart failure and pulmonary edema. Patient Being diuresed currently. PCCM and cardiology on board. She finished treatment for UTI, sepsis.   Assessment/Plan  Acute on chronic diastolic congestive heart failure, still massively hypervolemic - Transthoracic echocardiogram performed on 11/17/2014 showed preserved ejection fraction. - Most likely due to A. fib - Weight reported as stable - -3 L yesterday but no intake recorded -  Diuretics per cardiology, appreciate assistance -  BUN trending up  Atrial fibrillation with rapid ventricular response triggered by ILD on chronic anticoagulation -  Tele:  Rate controlled a-fib - continue cardizem, metoprolol 25 mg, and Digoxin - CHADVASC score 5 - INR at goal. -  Pharmacy to dose coumadin.  -  Appreciate EP assistance   End-stage interstitial lung disease followed by  pulmonology on high dose chronic steroids. -  Chest x-ray demonstrated pulmonary edema; no infiltrates  - continue tapering steroids slowly, currently on prednisone  and will be discharged on this dose with follow up with pulmonology -PCCM signed off.  Sepsis due to Proteus mirabilis and klebsiella Pneumoniae urinary tract infection, present at time of admission, with leukocytosis and tachycardia and lactic acid 4.15.  Resolved and completed course of antibiotics in hospital.  WBC normal.    Hypokalemia due to diuresis.  Oral potassium repletion  Thrush, improving, continue patient on nystatin and magic mouthwash  CKD, stage III, creatinine near baseline of 1.5, but BUN rising. -  Monitor renal function on lasix.   Diet:  Healthy heart Access:  Peripheral IV IVF:  Off Proph:  Warfarin  Code Status: Full code.  Patient states she wants to live another 5 years.  Family Communication: Patient alone Disposition Plan: Pending improvement in edema, breathing. She was offered LTAC today but she declined.   Consultation:  Cardiology   PCCM  EP  Antibiotics:  Vancomycin 7/24>>7/27  Elita Quick 7/24>>7/27  Rocephin 7/27>>7/32  Ceftin 7/31>>8/3  HPI/Subjective:  Continues to be very edematous.  She notices no difference in her breathing or in her swelling.  She is frustrated because she has not lost much weight and does not see any change in symptoms  Objective: Filed Vitals:   12/23/14 0458 12/23/14 0700 12/23/14 0823 12/23/14 0859  BP:  107/59  124/67  Pulse:  75  96  Temp:    97.4 F (36.3 C)  TempSrc:    Oral  Resp:  26  20  Height:      Weight: 129.2 kg (284 lb 13.4 oz)     SpO2:  91% 91%     Intake/Output Summary (Last 24 hours) at 12/23/14 8119 Last data  filed at 12/23/14 0901  Gross per 24 hour  Intake    100 ml  Output   3675 ml  Net  -3575 ml   Filed Weights   12/22/14 1037 12/22/14 1300 12/23/14 0458  Weight: 129.139 kg (284 lb 11.2 oz) 129.139 kg  (284 lb 11.2 oz) 129.2 kg (284 lb 13.4 oz)   Body mass index is 48.87 kg/(m^2).  Exam:   General:  Obese female, No acute distress, nasal cannula in place with 6 L O2, sitting in chair  HEENT:  NCAT, MMM, cushingoid faces  Cardiovascular:  IRRR, nl S1, S2 no mrg, 2+ pulses, warm extremities  Respiratory:  Very diminished bilateral breath sounds, persistent rales at the left base, no increased WOB  Abdomen:   NABS, soft, NT/ND  MSK:   Normal tone and bulk, stable 3+ pitting bilateral LEE  Neuro:  Grossly intact  Data Reviewed: Basic Metabolic Panel:  Recent Labs Lab 12/19/14 0910 12/20/14 0435 12/21/14 0306 12/22/14 0348 12/23/14 0216  NA 140 139 139 141 140  K 4.0 3.7 2.8* 2.9* 4.0  CL 92* 95* 91* 90* 92*  CO2 36* 33* 38* 39* 36*  GLUCOSE 153* 99 98 96 97  BUN 32* 49* 56* 57* 64*  CREATININE 1.28* 1.82* 1.79* 1.79* 1.72*  CALCIUM 9.6 9.2 9.1 9.3 9.2  MG  --   --  2.2  --   --    Liver Function Tests: No results for input(s): AST, ALT, ALKPHOS, BILITOT, PROT, ALBUMIN in the last 168 hours. No results for input(s): LIPASE, AMYLASE in the last 168 hours. No results for input(s): AMMONIA in the last 168 hours. CBC:  Recent Labs Lab 12/18/14 0342 12/19/14 0256 12/20/14 0435 12/21/14 0306 12/22/14 0348  WBC 7.2 7.6 7.7 7.1 7.0  HGB 12.7 13.0 12.7 11.9* 12.3  HCT 40.6 41.0 39.7 37.1 38.1  MCV 104.4* 103.3* 102.3* 101.9* 103.5*  PLT 137* 154 155 175 195    No results found for this or any previous visit (from the past 240 hour(s)).   Studies: No results found.  Scheduled Meds: . allopurinol  300 mg Oral Daily  . antiseptic oral rinse  7 mL Mouth Rinse q12n4p  . atorvastatin  10 mg Oral Daily  . budesonide  0.5 mg Nebulization BID  . chlorhexidine gluconate  15 mL Mouth Rinse BID  . digoxin  0.125 mg Oral Daily  . diltiazem  300 mg Oral Daily  . escitalopram  20 mg Oral Daily  . furosemide  80 mg Intravenous BID  . gabapentin  300 mg Oral Daily  .  magnesium chloride  1 tablet Oral BID  . metolazone  5 mg Oral Daily  . metoprolol tartrate  50 mg Oral BID  . nystatin  5 mL Oral QID  . predniSONE  30 mg Oral Q breakfast  . sodium chloride  3 mL Intravenous Q12H  . sodium chloride  3 mL Intravenous Q12H  . Warfarin - Pharmacist Dosing Inpatient   Does not apply q1800   Continuous Infusions:   Principal Problem:   Sepsis Active Problems:   CKD (chronic kidney disease) stage 3, GFR 30-59 ml/min   Interstitial lung disease   Debilitated   UTI (lower urinary tract infection)   Atrial fibrillation with RVR   FTT (failure to thrive) in adult   Blood poisoning   Weakness   Acute on chronic respiratory failure with hypoxia   Acute diastolic CHF (congestive heart failure)    Time  spent: 30 min    Ziyon Cedotal, [facility]  [facility] Pager [phone]. If 7PM-7AM, please contact night-coverage at [email or web address], password [facility] 12/23/2014, 9:26 AM  LOS: 19 days

## 2014-12-23 NOTE — Progress Notes (Signed)
ANTICOAGULATION CONSULT NOTE - Follow Up Consult  Pharmacy Consult for Coumadin Indication: Atrial fibrillation, hx VTE   Allergies  Allergen Reactions  . Pirfenidone Other (See Comments)    Congestion, breathing diffulty   Patient Measurements: Height:  (162.6 cm) Weight: 284 lb 13.4 oz (129.2 kg) IBW/kg (Calculated) : 54.7  Vital Signs: Temp: 97.4 F (36.3 C) (08/12 0859) Temp Source: Oral (08/12 0859) BP: 140/121 mmHg (08/12 1000) Pulse Rate: 85 (08/12 1000)  Labs:  Recent Labs  12/21/14 0306 12/22/14 0348 12/23/14 0216  HGB 11.9* 12.3  --   HCT 37.1 38.1  --   PLT 175 195  --   LABPROT 19.7* 21.4* 23.9*  INR 1.67* 1.87* 2.15*  CREATININE 1.79* 1.79* 1.72*    Estimated Creatinine Clearance: 34.2 mL/min (by C-G formula based on Cr of 1.72).  Assessment: 81yo female with AFib, on Coumadin pta with reduced INR goal per Cardiology. INR is at goal at 2.1, no bleeding noted.   PTA dose: 2.5 mg po daily   Goal of Therapy:  INR 1.8-2.2 Monitor platelets by anticoagulation protocol: Yes   Plan:  - Repeat warfarin  PO x 1  - Daily INR     Agapito Games, PharmD, BCPS Clinical Pharmacist Pager: [phone] 12/23/2014 11:02 AM

## 2014-12-23 NOTE — Progress Notes (Signed)
Patient Name: [name] [name] Date of Encounter: 12/23/2014  Principal Problem:   Sepsis Active Problems:   CKD (chronic kidney disease) stage 3, GFR 30-59 ml/min   Interstitial lung disease   Debilitated   UTI (lower urinary tract infection)   Atrial fibrillation with RVR   FTT (failure to thrive) in adult   Blood poisoning   Weakness   Acute on chronic respiratory failure with hypoxia   Acute diastolic CHF (congestive heart failure)   Primary Cardiologist: Dr. Mariah Milling  Patient Profile: 4-yo female, w/ PMH of interstitial lung disease (on chronic prednisone and home O2), chronic Diastolic HF, and chronic afib (on warfarin) admitted on 12/04/14 for sepsis.Hospitalized 07/06>07/14 w/ resp failure, then in rehab thru 07/22. Lowest wt in July 276 lbs.  SUBJECTIVE: Still having lots of lower extremity edema. Shortness of breath still present. No chest pain or palpitations.  OBJECTIVE Filed Vitals:   12/22/14 2125 12/22/14 2340 12/23/14 0400 12/23/14 0458  BP: 114/67 120/74 114/62   Pulse: 95 82 79   Temp: 98.1 F (36.7 C) 97.9 F (36.6 C) 97.5 F (36.4 C)   TempSrc: Oral Axillary Axillary   Resp: Height:      Weight:    284 lb 13.4 oz (129.2 kg)  SpO2: 93% 95% 91%     Intake/Output Summary (Last 24 hours) at 12/23/14 0735 Last data filed at 12/23/14 0500  Gross per 24 hour  Intake    200 ml  Output   3275 ml  Net  -3075 ml   Filed Weights   12/22/14 1037 12/22/14 1300 12/23/14 0458  Weight: 284 lb 11.2 oz (129.139 kg) 284 lb 11.2 oz (129.139 kg) 284 lb 13.4 oz (129.2 kg)    PHYSICAL EXAM General: Well developed, well nourished, female in no acute distress. Head: Normocephalic, atraumatic.  Neck: Supple without bruits, JVD unable to be assessed due to body habitus. Lungs:  Resp regular and unlabored, rales at bases bilaterally. Heart: Irregularly irregular.  No S3, S4, or murmur; no rub. Abdomen: Soft, non-tender, non-distended, BS + x 4.    Extremities: No clubbing, no cyanosis, 2+ pitting edema bilaterally.  Neuro: Alert and oriented X 3. Moves all extremities spontaneously. Psych: Normal affect.  LABS: CBC: Recent Labs  12/21/14 0306 12/22/14 0348  WBC 7.1 7.0  HGB 11.9* 12.3  HCT 37.1 38.1  MCV 101.9* 103.5*  PLT 175 195   INR: Recent Labs  12/23/14 0216  INR 2.15*   Basic Metabolic Panel: Recent Labs  12/21/14 0306 12/22/14 0348 12/23/14 0216  NA 139 141 140  K 2.8* 2.9* 4.0  CL 91* 90* 92*  CO2 38* 39* 36*  GLUCOSE 98 96 97  BUN 56* 57* 64*  CREATININE 1.79* 1.79* 1.72*  CALCIUM 9.1 9.3 9.2  MG 2.2  --   --    BNP:  B NATRIURETIC PEPTIDE  Date/Time Value Ref Range Status  12/04/2014 02:23 PM 280.1* 0.0 - 100.0 pg/mL Final  11/16/2014 06:51 PM 160.9* 0.0 - 100.0 pg/mL Final    TELE:   Atrial Fibrillation with rate in mid-70's to 80's.      Current Medications:  . allopurinol  300 mg Oral Daily  . antiseptic oral rinse  7 mL Mouth Rinse q12n4p  . atorvastatin  10 mg Oral Daily  . budesonide  0.5 mg Nebulization BID  . chlorhexidine gluconate  15 mL Mouth Rinse BID  . digoxin  0.125 mg Oral Daily  .  diltiazem  300 mg Oral Daily  . escitalopram  20 mg Oral Daily  . furosemide  80 mg Intravenous BID  . gabapentin  300 mg Oral Daily  . magnesium chloride  1 tablet Oral BID  . metolazone  5 mg Oral Daily  . metoprolol tartrate  50 mg Oral BID  . nystatin  5 mL Oral QID  . predniSONE  30 mg Oral Q breakfast  . sodium chloride  3 mL Intravenous Q12H  . sodium chloride  3 mL Intravenous Q12H  . Warfarin - Pharmacist Dosing Inpatient   Does not apply q1800     ASSESSMENT AND PLAN: 1. Atrial Fibrillation with RVR - failed DCC. Consult with EP on 12/12/2014, with Dr. Johney Frame saying he would favor rate control. HR in 70's - 80's over past 24 hours - Pharmacy managing Coumadin. INR 2.15 on 12/23/2014. - ContinueCardizem at 300mg  daily - Continue Lopressor 50 mg BID - Continue Digoxin  0.125mg  daily.Dig level last checked 12/18/14. Consider rechecking today.  2. Acute on Chronic Diastolic CHF Net fluids: -3.0L on 12/22/14, but only documented as intake which was IV. Patient says she has been consuming 3-4 12oz-cups of liquid per day. Continue to obtain I&O's.  - Admit weight (and Baseline weight per patient) 264 lbs, but weight on 12/20/14 was documented on the same day was 299 (more consistent w/ 2016 weights).  - Standing weight on 12/22/14 was 284 lbs. Continue to obtain daily standing weights.  - Continue Lasix 80mg  IV BID and Metolazone 5mg  daily. Follow renal function closely.   3. COPD/ILD - On chronic steroids  4. Hyperlipidemia - Continue Lipitor 10mg  daily  5. CKD, Stage III - Baseline approximately 1.5, but below this on admission. Creatinine at 1.72 on 12/23/14.  - follow with diuresis.   6. Hypokalemia - Potassium at 4.0 on 12/23/14. - continue to monitor BMET. Replace as needed.  Otherwise, per IM.   Signed, Wandra Mannan , PA-C 7:35 AM 12/23/2014   Agree with note by Wandra Mannan PA-C  Continues to diurese. Still has 3+ pitting edema. Scr stable. Wgt hasn't dropped but I/O  Neg 3.5 liters (-18 L total). Will increase IV lasix to q8 Hrs and will continue to follow with you.   [name], M.D., FACP, [facility], [facility] [place] [address] [place], [place]  [address]  [phone] 12/23/2014 10:55 AM

## 2014-12-23 NOTE — Progress Notes (Signed)
Physical Therapy Treatment Patient Details Name: [name] [name] MRN: [id number] DOB: July 27, 1931 Today's Date: 12/23/2014    History of Present Illness Patient is an 81 year old female with a past medical history of end-stage pulmonary fibrosis, chronic diastolic congestive heart failure, chronic hypoxemic respiratory failure, atrial fibrillation who was admitted to the medicine service on 12/04/2014 when she presented with complaints of a generalized weakness and functional decline. She had a recent hospitalization for acute on chronic hypoxemic respiratory failure believed to be secondary to acute flareup of end-stage interstitial lung disease and viral infection. She was discharged to inpatient rehabilitation. She was discharged from inpatient rehabilitation on 11/24/2014 and had been doing poorly at home. She was found to be in atrial fibrillation rapid ventricular response, encephalopathic, having sepsis.  Dx wth UTI.     PT Comments    Pt admitted with above diagnosis. Pt currently with functional limitations due to balance and endurance deficits with significantly low O2 sats at baseline.  Pt limited by decr sats and limited tolerance of activity.  Will continue PT as pt tolerates and plan for SNF when bed available.   Pt will benefit from skilled PT to increase their independence and safety with mobility to allow discharge to the venue listed below.    Follow Up Recommendations  SNF     Equipment Recommendations  None recommended by PT    Recommendations for Other Services       Precautions / Restrictions Precautions Precautions: Fall Precaution Comments: monitor O2 sats and HR Restrictions Weight Bearing Restrictions: No    Mobility  Bed Mobility               General bed mobility comments: pt received on 3N1  Transfers Overall transfer level: Needs assistance Equipment used: None Transfers: Sit to/from Stand;Stand Pivot Transfers Sit to Stand: Min guard;Min  assist Stand pivot transfers: Min guard       General transfer comment: On arrival to room, sats 70-76 % on 5LO2. Nurse and NT in room and nurse incr pt to 6LO2 with sats still 76-83%.  Performed sit to stand and SPT from 3N1 to recliner with good stability on feet using UEs holding rails and bed with wide BOS of feet.  Pt somewhat winded once in recliner and needed a rest break.  Pt stood with fwd flexed posture, worked on standing erect.   Once pt in recliner, sats incr to 85% on 6LO2 with 3-4 minute rest break.  Pt performed some exercises and sats still78-84% on 6LO2.  At rest not exercising, pt had sat of 85-90% on 6LO2 therefore decided to let pt rest and discontinue exercise at this time.  Nurse aware of sats.  Upon exiting room, sats 88-94% on 6LO2.    Ambulation/Gait                 Stairs            Wheelchair Mobility    Modified Rankin (Stroke Patients Only)       Balance Overall balance assessment: Needs assistance;History of Falls Sitting-balance support: No upper extremity supported;Feet supported Sitting balance-Leahy Scale: Good     Standing balance support: Bilateral upper extremity supported;During functional activity Standing balance-Leahy Scale: Poor Standing balance comment: Needed bil UEs to balance in standing.                     Cognition Arousal/Alertness: Awake/alert Behavior During Therapy: WFL for tasks assessed/performed Overall Cognitive Status: Within Functional Limits  for tasks assessed                      Exercises General Exercises - Lower Extremity Ankle Circles/Pumps: 10 reps;Both;Seated Long Arc Quad: AROM;Both;15 reps;Seated Hip Flexion/Marching: AROM;Both;10 reps;Seated    General Comments General comments (skin integrity, edema, etc.): O2 78-88% on 6LO2 throughout session.  Nursing aware.  Pt states she normally walks with RW about 8-10 feet at home and rests.  Pt sats too low to try today.  Changed pulse  ox cable toward end of session as PT thought that cord may be faulty.  Sats were a little better after changed cord, however still in mid 80's.        Pertinent Vitals/Pain Pain Assessment: No/denies pain  Sats 70-76% on 5LO2 on arrival.  82-86% on 6LO2.  On departure, sats 87-90% on 6LO2.  Nurse aware of desat at baseline and with activity.  HR 75-100 bpm, 124/67.      Home Living                      Prior Function            PT Goals (current goals can now be found in the care plan section) Progress towards PT goals: Progressing toward goals    Frequency  Min 3X/week    PT Plan Current plan remains appropriate    Co-evaluation             End of Session Equipment Utilized During Treatment: Gait belt;Oxygen Activity Tolerance: Patient limited by fatigue Patient left: in chair;with call bell/phone within reach     Time: 0851-0909 PT Time Calculation (min) (ACUTE ONLY): 18 min  Charges:  $Therapeutic Activity: 8-22 mins                    G CodesTawni Millers [name] 2015-01-16, 10:14 AM [name] [name],PT Acute Rehabilitation [phone] (pager)

## 2014-12-24 DIAGNOSIS — I4819 Other persistent atrial fibrillation: Secondary | ICD-10-CM | POA: Insufficient documentation

## 2014-12-24 DIAGNOSIS — I5033 Acute on chronic diastolic (congestive) heart failure: Secondary | ICD-10-CM

## 2014-12-24 LAB — BASIC METABOLIC PANEL
Anion gap: 11 (ref 5–15)
BUN: 69 mg/dL — ABNORMAL HIGH (ref 6–20)
CHLORIDE: 89 mmol/L — AB (ref 101–111)
CO2: 39 mmol/L — AB (ref 22–32)
CREATININE: 1.93 mg/dL — AB (ref 0.44–1.00)
Calcium: 9.5 mg/dL (ref 8.9–10.3)
GFR calc Af Amer: 27 mL/min — ABNORMAL LOW (ref >60)
GFR calc non Af Amer: 23 mL/min — ABNORMAL LOW (ref >60)
GLUCOSE: 110 mg/dL — AB (ref 65–99)
Potassium: 4.1 mmol/L (ref 3.5–5.1)
SODIUM: 139 mmol/L (ref 135–145)

## 2014-12-24 LAB — PROTIME-INR
INR: 2.32 — ABNORMAL HIGH (ref 0.00–1.49)
Prothrombin Time: 25.2 seconds — ABNORMAL HIGH (ref 11.6–15.2)

## 2014-12-24 MED ORDER — FUROSEMIDE 10 MG/ML IJ SOLN
80.0000 mg | Freq: Two times a day (BID) | INTRAMUSCULAR | Status: DC
  Administered 2014-12-24 – 2014-12-25 (×2): 80 mg via INTRAVENOUS
  Filled 2014-12-24 (×2): qty 8

## 2014-12-24 MED ORDER — DIGOXIN 125 MCG PO TABS
0.1250 mg | ORAL_TABLET | ORAL | Status: DC
  Administered 2014-12-26 – 2014-12-28 (×2): 0.125 mg via ORAL
  Filled 2014-12-24 (×2): qty 1

## 2014-12-24 MED ORDER — WARFARIN SODIUM 2.5 MG PO TABS
2.5000 mg | ORAL_TABLET | Freq: Once | ORAL | Status: AC
  Administered 2014-12-24: 2.5 mg via ORAL
  Filled 2014-12-24: qty 1

## 2014-12-24 MED ORDER — WARFARIN SODIUM 2.5 MG PO TABS
2.5000 mg | ORAL_TABLET | Freq: Every day | ORAL | Status: DC
  Administered 2014-12-25 – 2014-12-28 (×4): 2.5 mg via ORAL
  Filled 2014-12-24 (×6): qty 1

## 2014-12-24 MED ORDER — FUROSEMIDE 10 MG/ML IJ SOLN: 80.0000 mg | Freq: Three times a day (TID) | INTRAMUSCULAR | Status: DC

## 2014-12-24 NOTE — Progress Notes (Signed)
ANTICOAGULATION CONSULT NOTE - Follow Up Consult  Pharmacy Consult for Coumadin Indication: Atrial fibrillation, hx VTE   Allergies  Allergen Reactions  . Pirfenidone Other (See Comments)    Congestion, breathing diffulty   Patient Measurements: Height:  (162.6 cm) Weight: 271 lb 6.2 oz (123.1 kg) IBW/kg (Calculated) : 54.7  Vital Signs: Temp: 97.3 F (36.3 C) (08/13 0800) Temp Source: Oral (08/13 0800) BP: 106/57 mmHg (08/13 0800) Pulse Rate: 80 (08/13 0800)  Labs:  Recent Labs  12/22/14 0348 12/23/14 0216 12/24/14 0313  HGB 12.3  --   --   HCT 38.1  --   --   PLT 195  --   --   LABPROT 21.4* 23.9* 25.2*  INR 1.87* 2.15* 2.32*  CREATININE 1.79* 1.72* 1.93*   Estimated Creatinine Clearance: 29.6 mL/min (by C-G formula based on Cr of 1.93).  Assessment: 81yo female with AFib, on Coumadin pta with reduced INR goal per Cardiology. INR is just above goal range at 2.3 without noted bleeding.   PTA dose: 2.5 mg po daily   Goal of Therapy:  INR 1.8-2.2 Monitor platelets by anticoagulation protocol: Yes   Plan:  - Will put her back on home regimen of 2.5mg  daily to avoid overshooting target.  - Daily INR  Nadara Mustard, PharmD., MS Clinical Pharmacist Pager:  [phone] Thank you for allowing pharmacy to be part of this patients care team. 12/24/2014 10:21 AM

## 2014-12-24 NOTE — Progress Notes (Signed)
[facility] PROGRESS NOTE  [name] [name] GNF:[id number] DOB: 08-19-1931 DOA: 12/04/2014 PCP: Kerby Nora, MD  Brief Summary  81 y.o. female with a past medical history of pulmonary fibrosis/interstitial pneumonitis, diastolic congestive heart failure, chronic hypoxemic respiratory failure, atrial fibrillation on chronic anticoagulation who was recently admitted to the pulmonary critical care medicine service on 11/16/2014 discharged on 11/24/2014 to acute rehabilitation. During the hospitalization she initially presented with acute on chronic respiratory failure with hypoxemia which was believed to be secondary to acute flare up of end-stage interstitial lung disease. She was treated with IV steroids and broad-spectrum IV antimicrobial therapy. He was found to have a parainfluenza viral infection. She showed slow clinical improvement however becoming debilitated from prolonged hospitalization. She was discharged to inpatient rehabilitation on 11/24/2014. Readmitted on 12/04/14 due to sepsis from UTI and A. Fib with RVR. Course complicated with acute on chronic diastolic heart failure and pulmonary edema. Patient Being diuresed currently. PCCM and cardiology on board. She finished treatment for UTI, sepsis.   Assessment/Plan  Acute on chronic diastolic congestive heart failure, still massively hypervolemic, -1.95 yesterday - Transthoracic echocardiogram performed on 11/17/2014 showed preserved ejection fraction. - Most likely due to A. fib - Weight trending down - - 1.95 L yesterday -  Diuretics per cardiology, appreciate assistance -  BUN and creatinine trending up, monitor carefully  Atrial fibrillation with rapid ventricular response triggered by ILD on chronic anticoagulation -  Tele:  Rate controlled a-fib - continue cardizem, metoprolol 25 mg, and Digoxin - CHADVASC score 5 - INR at goal. -  Appreciate Pharmacy assistance  -  Appreciate EP assistance   End-stage interstitial  lung disease followed by pulmonology on high dose chronic steroids. -  Chest x-ray demonstrated pulmonary edema; no infiltrates  - continue tapering steroids slowly, currently on prednisone  and will be discharged on this dose with follow up with pulmonology -PCCM signed off.  Sepsis due to Proteus mirabilis and klebsiella Pneumoniae urinary tract infection, present at time of admission, with leukocytosis and tachycardia and lactic acid 4.15.  Resolved and completed course of antibiotics in hospital.  WBC normal.    Hypokalemia due to diuresis.  Oral potassium repletion  Thrush, improving, continue patient on nystatin and magic mouthwash  CKD, stage III, creatinine rising gradually -  Monitor renal function on lasix.   Diet:  Healthy heart Access:  Peripheral IV IVF:  Off Proph:  Warfarin  Code Status: Full code.  Patient states she wants to live another 5 years.  Family Communication: Patient alone Disposition Plan: Pending improvement in edema, breathing. She was offered LTAC but she declined.   Consultation:  Cardiology   PCCM  EP  Antibiotics:  Vancomycin 7/24>>7/27  Elita Quick 7/24>>7/27  Rocephin 7/27>>7/32  Ceftin 7/31>>8/3  HPI/Subjective:  Continues to be very edematous.  She notices no difference in her breathing or in her swelling.  She is happy that she is starting to lose some weight.  Objective: Filed Vitals:   12/24/14 0200 12/24/14 0320 12/24/14 0333 12/24/14 0800  BP: 129/116 121/92  106/57  Pulse: 64 90 74 80  Temp:  97.5 F (36.4 C)  97.3 F (36.3 C)  TempSrc:  Axillary  Oral  Resp: Height:      Weight:  123.1 kg (271 lb 6.2 oz)    SpO2: 96% 94%  96%    Intake/Output Summary (Last 24 hours) at 12/24/14 1031 Last data filed at 12/24/14 0939  Gross per 24 hour  Intake   1516 ml  Output   2325 ml  Net   -809 ml   Filed Weights   12/22/14 1300 12/23/14 0458 12/24/14 0320  Weight: 129.139 kg (284 lb 11.2 oz) 129.2 kg  (284 lb 13.4 oz) 123.1 kg (271 lb 6.2 oz)   Body mass index is 46.56 kg/(m^2).  Exam:   General:  Obese female, No acute distress, nasal cannula in place with 6 L O2, sitting in chair, not apparent change from prior exam  HEENT:  NCAT, MMM, cushingoid faces  Cardiovascular:  IRRR, nl S1, S2 no mrg, 2+ pulses, warm extremities  Respiratory:  Very diminished bilateral breath sounds, persistent rales at the left base, no increased WOB  Abdomen:   NABS, soft, NT/ND  MSK:   Normal tone and bulk, stable 3+ pitting bilateral LEE  Data Reviewed: Basic Metabolic Panel:  Recent Labs Lab 12/20/14 0435 12/21/14 0306 12/22/14 0348 12/23/14 0216 12/24/14 0313  NA 139 139 141 140 139  K 3.7 2.8* 2.9* 4.0 4.1  CL 95* 91* 90* 92* 89*  CO2 33* 38* 39* 36* 39*  GLUCOSE 99 98 96 97 110*  BUN 49* 56* 57* 64* 69*  CREATININE 1.82* 1.79* 1.79* 1.72* 1.93*  CALCIUM 9.2 9.1 9.3 9.2 9.5  MG  --  2.2  --   --   --    Liver Function Tests: No results for input(s): AST, ALT, ALKPHOS, BILITOT, PROT, ALBUMIN in the last 168 hours. No results for input(s): LIPASE, AMYLASE in the last 168 hours. No results for input(s): AMMONIA in the last 168 hours. CBC:  Recent Labs Lab 12/18/14 0342 12/19/14 0256 12/20/14 0435 12/21/14 0306 12/22/14 0348  WBC 7.2 7.6 7.7 7.1 7.0  HGB 12.7 13.0 12.7 11.9* 12.3  HCT 40.6 41.0 39.7 37.1 38.1  MCV 104.4* 103.3* 102.3* 101.9* 103.5*  PLT 137* 154 155 175 195    No results found for this or any previous visit (from the past 240 hour(s)).   Studies: No results found.  Scheduled Meds: . allopurinol  300 mg Oral Daily  . antiseptic oral rinse  7 mL Mouth Rinse q12n4p  . atorvastatin  10 mg Oral Daily  . budesonide  0.5 mg Nebulization BID  . chlorhexidine gluconate  15 mL Mouth Rinse BID  . digoxin  0.125 mg Oral Daily  . diltiazem  300 mg Oral Daily  . escitalopram  20 mg Oral Daily  . furosemide  80 mg Intravenous TID  . gabapentin  300 mg Oral  Daily  . magnesium chloride  1 tablet Oral BID  . metolazone  5 mg Oral Daily  . metoprolol tartrate  50 mg Oral BID  . nystatin  5 mL Oral QID  . predniSONE  30 mg Oral Q breakfast  . sodium chloride  3 mL Intravenous Q12H  . sodium chloride  3 mL Intravenous Q12H  . warfarin  2.5 mg Oral ONCE-1800  . [START ON 12/25/2014] warfarin  2.5 mg Oral q1800  . Warfarin - Pharmacist Dosing Inpatient   Does not apply q1800   Continuous Infusions:   Principal Problem:   Sepsis Active Problems:   CKD (chronic kidney disease) stage 3, GFR 30-59 ml/min   Interstitial lung disease   Debilitated   UTI (lower urinary tract infection)   Atrial fibrillation with RVR   FTT (failure to thrive) in adult   Blood poisoning   Weakness   Acute on chronic respiratory failure with hypoxia  Acute diastolic CHF (congestive heart failure)    Time spent: 30 min    Brocha Gilliam, [facility]  [facility] Pager [phone]. If 7PM-7AM, please contact night-coverage at [email or web address], password [facility] 12/24/2014, 10:31 AM  LOS: 20 days

## 2014-12-24 NOTE — Progress Notes (Signed)
Primary cardiologist: Dr. Dossie Arbour  Seen for followup: Diastolic failure, atrial fibrillation  Subjective:    No chest pain or sense of palpitations, no overall change in breathing status.  Objective:   Temp:  [96.7 F (35.9 C)-97.5 F (36.4 C)] 97.3 F (36.3 C) (08/13 0800) Pulse Rate:  [53-90] 64 (08/13 1100) Resp:  [16-37] 21 (08/13 1100) BP: (103-129)/(57-116) 110/57 mmHg (08/13 1100) SpO2:  [84 %-96 %] 86 % (08/13 1100) FiO2 (%):  [40 %] 40 % (08/13 0333) Weight:  [271 lb 6.2 oz (123.1 kg)] 271 lb 6.2 oz (123.1 kg) (08/13 0320) Last BM Date: 12/22/14  Filed Weights   12/22/14 1300 12/23/14 0458 12/24/14 0320  Weight: 284 lb 11.2 oz (129.139 kg) 284 lb 13.4 oz (129.2 kg) 271 lb 6.2 oz (123.1 kg)    Intake/Output Summary (Last 24 hours) at 12/24/14 1247 Last data filed at 12/24/14 0939  Gross per 24 hour  Intake   1156 ml  Output   1875 ml  Net   -719 ml    Telemetry: Rate-controlled atrial fibrillation.  Exam:   General: Morbidly obese, no distress.  Lungs: Coarse breath sounds, diminished.  Cardiac: Irregularly irregular without gallop.  Abdomen: Obese, nontender.  Extremities: Significant, chronic appearing leg edema and stasis.  Lab Results:  Basic Metabolic Panel:  Recent Labs Lab 12/21/14 0306 12/22/14 0348 12/23/14 0216 12/24/14 0313  NA 139 141 140 139  K 2.8* 2.9* 4.0 4.1  CL 91* 90* 92* 89*  CO2 38* 39* 36* 39*  GLUCOSE 98 96 97 110*  BUN 56* 57* 64* 69*  CREATININE 1.79* 1.79* 1.72* 1.93*  CALCIUM 9.1 9.3 9.2 9.5  MG 2.2  --   --   --     CBC:  Recent Labs Lab 12/20/14 0435 12/21/14 0306 12/22/14 0348  WBC 7.7 7.1 7.0  HGB 12.7 11.9* 12.3  HCT 39.7 37.1 38.1  MCV 102.3* 101.9* 103.5*  PLT 155 175 195    BNP:  Recent Labs  02/22/14 0947 02/24/14 0332 03/07/14 0648  PROBNP 2319.0* 2410.0* 437.5    Coagulation:  Recent Labs Lab 12/22/14 0348 12/23/14 0216 12/24/14 0313  INR 1.87* 2.15* 2.32*     Medications:   Scheduled Medications: . allopurinol  300 mg Oral Daily  . antiseptic oral rinse  7 mL Mouth Rinse q12n4p  . atorvastatin  10 mg Oral Daily  . budesonide  0.5 mg Nebulization BID  . chlorhexidine gluconate  15 mL Mouth Rinse BID  . digoxin  0.125 mg Oral Daily  . diltiazem  300 mg Oral Daily  . escitalopram  20 mg Oral Daily  . furosemide  80 mg Intravenous TID  . gabapentin  300 mg Oral Daily  . magnesium chloride  1 tablet Oral BID  . metolazone  5 mg Oral Daily  . metoprolol tartrate  50 mg Oral BID  . nystatin  5 mL Oral QID  . predniSONE  30 mg Oral Q breakfast  . sodium chloride  3 mL Intravenous Q12H  . sodium chloride  3 mL Intravenous Q12H  . warfarin  2.5 mg Oral ONCE-1800  . [START ON 12/25/2014] warfarin  2.5 mg Oral q1800  . Warfarin - Pharmacist Dosing Inpatient   Does not apply q1800     PRN Medications:  sodium chloride, acetaminophen **OR** acetaminophen, levalbuterol, magic mouthwash w/lidocaine, ondansetron **OR** ondansetron (ZOFRAN) IV, oxyCODONE, sodium chloride   Assessment:   1. Acute on chronic diastolic heart failure, still  with significant volume overload but diuresing. Lasix was advanced yesterday, creatinine has increased from 1.7 to 1.9.  2. Persistent atrial fibrillation, continue strategy of heart rate control and anticoagulation. No change to current regimen.  3. End-stage interstitial lung disease, on steroid-induced.  4. CKD stage 3, creatinine up to 1.9.  5. Hyperlipidemia, on Lipitor.   Plan/Discussion:    Since she was diuresing reasonably well on prior dose of Lasix, and creatinine is bumping up higher now, will reduce Lasix back to 80 mg IV twice daily and continue metolazone. Also change the digoxin to every other day dosing.   Jonelle Sidle, M.D., F.A.C.C.

## 2014-12-25 ENCOUNTER — Inpatient Hospital Stay (HOSPITAL_COMMUNITY): Payer: Medicare Other

## 2014-12-25 LAB — BASIC METABOLIC PANEL
ANION GAP: 14 (ref 5–15)
BUN: 76 mg/dL — AB (ref 6–20)
CHLORIDE: 88 mmol/L — AB (ref 101–111)
CO2: 38 mmol/L — ABNORMAL HIGH (ref 22–32)
Calcium: 9.4 mg/dL (ref 8.9–10.3)
Creatinine, Ser: 1.81 mg/dL — ABNORMAL HIGH (ref 0.44–1.00)
GFR, EST AFRICAN AMERICAN: 29 mL/min — AB (ref >60)
GFR, EST NON AFRICAN AMERICAN: 25 mL/min — AB (ref >60)
GLUCOSE: 104 mg/dL — AB (ref 65–99)
POTASSIUM: 2.9 mmol/L — AB (ref 3.5–5.1)
SODIUM: 140 mmol/L (ref 135–145)

## 2014-12-25 LAB — BLOOD GAS, ARTERIAL
Acid-Base Excess: 16 mmol/L — ABNORMAL HIGH (ref 0.0–2.0)
Bicarbonate: 41.4 mEq/L — ABNORMAL HIGH (ref 20.0–24.0)
Drawn by: 406621
O2 CONTENT: 6 L/min
O2 Saturation: 87.9 %
PCO2 ART: 64.4 mmHg — AB (ref 35.0–45.0)
Patient temperature: 98.6
TCO2: 43.4 mmol/L (ref 0–100)
pH, Arterial: 7.425 (ref 7.350–7.450)
pO2, Arterial: 56.8 mmHg — ABNORMAL LOW (ref 80.0–100.0)

## 2014-12-25 LAB — PROTIME-INR
INR: 2.54 — AB (ref 0.00–1.49)
Prothrombin Time: 27 seconds — ABNORMAL HIGH (ref 11.6–15.2)

## 2014-12-25 MED ORDER — FUROSEMIDE 10 MG/ML IJ SOLN
60.0000 mg | Freq: Three times a day (TID) | INTRAMUSCULAR | Status: DC
  Administered 2014-12-25 – 2014-12-26 (×4): 60 mg via INTRAVENOUS
  Filled 2014-12-25 (×4): qty 6

## 2014-12-25 MED ORDER — POTASSIUM CHLORIDE CRYS ER 20 MEQ PO TBCR
40.0000 meq | EXTENDED_RELEASE_TABLET | ORAL | Status: AC
  Administered 2014-12-25 (×2): 40 meq via ORAL
  Filled 2014-12-25 (×2): qty 2

## 2014-12-25 MED ORDER — FLUCONAZOLE 150 MG PO TABS
150.0000 mg | ORAL_TABLET | Freq: Once | ORAL | Status: AC
  Administered 2014-12-25: 150 mg via ORAL
  Filled 2014-12-25: qty 1

## 2014-12-25 MED ORDER — POTASSIUM CHLORIDE 10 MEQ/100ML IV SOLN
10.0000 meq | INTRAVENOUS | Status: DC
  Administered 2014-12-25: 10 meq via INTRAVENOUS
  Filled 2014-12-25: qty 100

## 2014-12-25 MED ORDER — POTASSIUM CHLORIDE CRYS ER 20 MEQ PO TBCR
40.0000 meq | EXTENDED_RELEASE_TABLET | Freq: Every day | ORAL | Status: DC
  Filled 2014-12-25: qty 2

## 2014-12-25 NOTE — Progress Notes (Signed)
Patient unable to tolerate PIV potassium. She c/o burning with rate turned down to half and normal saline backup. [facility] team notified and new orders received for PO potassium. On coming nurse aware.

## 2014-12-25 NOTE — Progress Notes (Signed)
12/25/2014 patient was up to beside commode on 6Liters oxygen satting 83 to 84 and had shortness of breath. She was placed on a non rebreather when after placed in bed and eventually began satting 93 to 94. Reslpiratory was made aware. [facility] RN.

## 2014-12-25 NOTE — Progress Notes (Signed)
[facility] PROGRESS NOTE  [name] [name] ZOX:[id number] DOB: 1932-01-03 DOA: 12/04/2014 PCP: Kerby Nora, MD  Brief Summary  81 y.o. female with a past medical history of pulmonary fibrosis/interstitial pneumonitis, diastolic congestive heart failure, chronic hypoxemic respiratory failure, atrial fibrillation on chronic anticoagulation who was recently admitted to the pulmonary critical care medicine service on 11/16/2014 discharged on 11/24/2014 to acute rehabilitation. During the hospitalization she initially presented with acute on chronic respiratory failure with hypoxemia which was believed to be secondary to acute flare up of end-stage interstitial lung disease. She was treated with IV steroids and broad-spectrum IV antimicrobial therapy. He was found to have a parainfluenza viral infection. She showed slow clinical improvement however becoming debilitated from prolonged hospitalization. She was discharged to inpatient rehabilitation on 11/24/2014. Readmitted on 12/04/14 due to sepsis from UTI and A. Fib with RVR. Course complicated with acute on chronic diastolic heart failure and pulmonary edema. Patient Being diuresed currently. PCCM and cardiology on board. She finished treatment for UTI, sepsis.   Assessment/Plan  Acute on chronic hypoxic respiratory failure secondary to acute on chronic diastolic congestive heart failure, still massively hypervolemic and not diuresing well on BID lasix - Transthoracic echocardiogram performed on 11/17/2014 showed preserved ejection fraction. - Most likely due to A. fib - Weight up 5kg since yesterday - net even yesterday -  Diuresis per cardiology -  ABG and CXR  Atrial fibrillation with rapid ventricular response triggered by ILD on chronic anticoagulation -  Tele:  Rate controlled a-fib - continue cardizem, metoprolol 25 mg, and Digoxin - CHADVASC score 5 - INR at goal. -  Appreciate Pharmacy assistance  -  Appreciate EP assistance    End-stage interstitial lung disease followed by pulmonology on high dose chronic steroids. -  Chest x-ray demonstrated pulmonary edema; no infiltrates  - continue tapering steroids slowly, currently on prednisone 30mg  and will be discharged on this dose with follow up with pulmonology -PCCM signed off.  Sepsis due to Proteus mirabilis and klebsiella Pneumoniae urinary tract infection, present at time of admission, with leukocytosis and tachycardia and lactic acid 4.15.  Resolved and completed course of antibiotics in hospital.  WBC normal.    Hypokalemia due to diuresis.  Oral potassium repletion  Thrush, improving, continue patient on nystatin and magic mouthwash  CKD, stage III, creatinine down to 1.81.  BUN is rising.  Agree with sacrificing some kidney function in interest of preserving breathing.    Dysuria -  Check UA and culture -  Fluconazole x 1  Diet:  Healthy heart Access:  Peripheral IV IVF:  Off Proph:  Warfarin  Code Status: Full code.  Patient states she wants to live another 5 years.  Family Communication: Patient alone Disposition Plan: Pending improvement in edema, breathing. She was offered LTAC but she declined.   Consultation:  Cardiology   PCCM  EP  Antibiotics:  Vancomycin 7/24>>7/27  Elita Quick 7/24>>7/27  Rocephin 7/27>>7/32  Ceftin 7/31>>8/3  HPI/Subjective:  Continues to be very edematous.  Lightheaded today and feels SOB.  Has some pain in the vulvar area with some dysuria.    Objective: Filed Vitals:   12/25/14 0500 12/25/14 0600 12/25/14 0732 12/25/14 0800  BP: 102/62 110/53  96/66  Pulse: 65 85  52  Temp:    98.4 F (36.9 C)  TempSrc:    Oral  Resp: 22 22  20   Height:      Weight: 127.9 kg (281 lb 15.5 oz)     SpO2: 94% 90%  89% 93%    Intake/Output Summary (Last 24 hours) at 12/25/14 1154 Last data filed at 12/25/14 0647  Gross per 24 hour  Intake    120 ml  Output    550 ml  Net   -430 ml   Filed Weights    12/23/14 0458 12/24/14 0320 12/25/14 0500  Weight: 129.2 kg (284 lb 13.4 oz) 123.1 kg (271 lb 6.2 oz) 127.9 kg (281 lb 15.5 oz)   Body mass index is 48.38 kg/(m^2).  Exam:   General:  Obese female, mild respiratory distress today with SCM rtx and breathlessness when talking, nasal cannula in place with 6 L O2, sitting in chair, O2 sat in the low to mid-80s, pursed lip breathing  HEENT:  NCAT, MMM, cushingoid faces  Cardiovascular:  IRRR, nl S1, S2 no mrg, 2+ pulses, warm extremities  Respiratory:  Very diminished bilateral breath sounds, persistent rales at the left base, no increased WOB  Abdomen:   NABS, soft, NT/ND  MSK:   Normal tone and bulk, 3+ pitting bilateral LEE but legs are softer today  Data Reviewed: Basic Metabolic Panel:  Recent Labs Lab 12/21/14 0306 12/22/14 0348 12/23/14 0216 12/24/14 0313 12/25/14 0355  NA 139 141 140 139 140  K 2.8* 2.9* 4.0 4.1 2.9*  CL 91* 90* 92* 89* 88*  CO2 38* 39* 36* 39* 38*  GLUCOSE 98 96 97 110* 104*  BUN 56* 57* 64* 69* 76*  CREATININE 1.79* 1.79* 1.72* 1.93* 1.81*  CALCIUM 9.1 9.3 9.2 9.5 9.4  MG 2.2  --   --   --   --    Liver Function Tests: No results for input(s): AST, ALT, ALKPHOS, BILITOT, PROT, ALBUMIN in the last 168 hours. No results for input(s): LIPASE, AMYLASE in the last 168 hours. No results for input(s): AMMONIA in the last 168 hours. CBC:  Recent Labs Lab 12/19/14 0256 12/20/14 0435 12/21/14 0306 12/22/14 0348  WBC 7.6 7.7 7.1 7.0  HGB 13.0 12.7 11.9* 12.3  HCT 41.0 39.7 37.1 38.1  MCV 103.3* 102.3* 101.9* 103.5*  PLT 154 155 175 195    No results found for this or any previous visit (from the past 240 hour(s)).   Studies: Dg Chest Port 1 View  12/25/2014   CLINICAL DATA:  81 year old female with history of hypoxia.  EXAM: PORTABLE CHEST - 1 VIEW  COMPARISON:  Chest x-ray 12/14/2014.  FINDINGS: Lung volumes are low. Atelectasis and/or consolidation of the left lung base. Small left pleural  effusion. Diffusely coarsened interstitial markings appear to be chronic compared to numerous prior examinations. Cephalization of the pulmonary vasculature. Mild cardiomegaly. The patient is rotated to the left on today's exam, resulting in distortion of the mediastinal contours and reduced diagnostic sensitivity and specificity for mediastinal pathology. Atherosclerosis in the thoracic aorta.  IMPRESSION: 1. Study is limited by are low lung volumes and patient positioning. There appears to be atelectasis and/or consolidation in the left lower lobe with a small left pleural effusion. 2. Cardiomegaly with cephalization of the pulmonary vasculature. There is indistinctness of the interstitial markings which may suggest mild congestive heart failure, however, the coarsened interstitial markings may alternatively be chronic. 3. Atherosclerosis.   Electronically Signed   By: Trudie Reed M.D.   On: 12/25/2014 11:03    Scheduled Meds: . allopurinol  300 mg Oral Daily  . antiseptic oral rinse  7 mL Mouth Rinse q12n4p  . atorvastatin  10 mg Oral Daily  . budesonide  0.5 mg  Nebulization BID  . chlorhexidine gluconate  15 mL Mouth Rinse BID  . [START ON 12/26/2014] digoxin  0.125 mg Oral QODAY  . diltiazem  300 mg Oral Daily  . escitalopram  20 mg Oral Daily  . fluconazole  150 mg Oral Once  . furosemide  60 mg Intravenous 3 times per day  . gabapentin  300 mg Oral Daily  . magnesium chloride  1 tablet Oral BID  . metolazone  5 mg Oral Daily  . metoprolol tartrate  50 mg Oral BID  . nystatin  5 mL Oral QID  . potassium chloride  40 mEq Oral Q4H  . [START ON 12/26/2014] potassium chloride  40 mEq Oral Daily  . predniSONE  30 mg Oral Q breakfast  . sodium chloride  3 mL Intravenous Q12H  . sodium chloride  3 mL Intravenous Q12H  . warfarin  2.5 mg Oral q1800  . Warfarin - Pharmacist Dosing Inpatient   Does not apply q1800   Continuous Infusions:   Principal Problem:   Sepsis Active Problems:    CKD (chronic kidney disease) stage 3, GFR 30-59 ml/min   Interstitial lung disease   Debilitated   UTI (lower urinary tract infection)   Atrial fibrillation with RVR   FTT (failure to thrive) in adult   Blood poisoning   Weakness   Acute on chronic respiratory failure with hypoxia   Acute diastolic CHF (congestive heart failure)   Persistent atrial fibrillation    Time spent: 30 min    [name] [name]  [facility] Pager [phone]. If 7PM-7AM, please contact night-coverage at [email or web address], password [facility] 12/25/2014, 11:54 AM  LOS: 21 days

## 2014-12-25 NOTE — Progress Notes (Signed)
Took pt off NRB and placed back on 6L [place]. RN aware. Pt denies SOB. RT will continue to monitor.

## 2014-12-25 NOTE — Progress Notes (Signed)
Per Md, placed pt on VM at 8L.40% based on current ABG. Pt denies SOB, no increased WOB. Pt spo2 on monitor reading 83-88%. Placed pt on NRB but will only leave on for a short while, due to COPD. Pt's Son came into room at this time and stated her normal spo2 is 85-90% at least for the last year. Pt refuses bipap at this time, denies sob. RT will monitor closely.

## 2014-12-25 NOTE — Progress Notes (Signed)
Primary cardiologist: Dr. Dossie Arbour  Seen for followup: Diastolic failure, atrial fibrillation  Subjective:    Complains of dizziness today. No chest pain or sense of palpitations.  Objective:   Temp:  [97.2 F (36.2 C)-98.4 F (36.9 C)] 98.4 F (36.9 C) (08/14 0800) Pulse Rate:  [38-85] 52 (08/14 0800) Resp:  [17-35] 20 (08/14 0800) BP: (96-128)/(53-102) 96/66 mmHg (08/14 0800) SpO2:  [88 %-96 %] 93 % (08/14 0800) Weight:  [281 lb 15.5 oz (127.9 kg)] 281 lb 15.5 oz (127.9 kg) (08/14 0500) Last BM Date: 12/23/14  Filed Weights   12/23/14 0458 12/24/14 0320 12/25/14 0500  Weight: 284 lb 13.4 oz (129.2 kg) 271 lb 6.2 oz (123.1 kg) 281 lb 15.5 oz (127.9 kg)    Intake/Output Summary (Last 24 hours) at 12/25/14 1120 Last data filed at 12/25/14 0647  Gross per 24 hour  Intake    120 ml  Output    550 ml  Net   -430 ml    Telemetry: Rate-controlled atrial fibrillation.  Exam:   General: Morbidly obese, no distress.  Lungs: Coarse breath sounds, diminished.  Cardiac: Irregularly irregular without gallop.  Abdomen: Obese, nontender.  Extremities: Significant, chronic appearing leg edema and stasis.  Lab Results:  Basic Metabolic Panel:  Recent Labs Lab 12/21/14 0306  12/23/14 0216 12/24/14 0313 12/25/14 0355  NA 139  < > 140 139 140  K 2.8*  < > 4.0 4.1 2.9*  CL 91*  < > 92* 89* 88*  CO2 38*  < > 36* 39* 38*  GLUCOSE 98  < > 97 110* 104*  BUN 56*  < > 64* 69* 76*  CREATININE 1.79*  < > 1.72* 1.93* 1.81*  CALCIUM 9.1  < > 9.2 9.5 9.4  MG 2.2  --   --   --   --   < > = values in this interval not displayed.  CBC:  Recent Labs Lab 12/20/14 0435 12/21/14 0306 12/22/14 0348  WBC 7.7 7.1 7.0  HGB 12.7 11.9* 12.3  HCT 39.7 37.1 38.1  MCV 102.3* 101.9* 103.5*  PLT 155 175 195    Coagulation:  Recent Labs Lab 12/23/14 0216 12/24/14 0313 12/25/14 0355  INR 2.15* 2.32* 2.54*    Medications:   Scheduled Medications: . allopurinol  300 mg  Oral Daily  . antiseptic oral rinse  7 mL Mouth Rinse q12n4p  . atorvastatin  10 mg Oral Daily  . budesonide  0.5 mg Nebulization BID  . chlorhexidine gluconate  15 mL Mouth Rinse BID  . [START ON 12/26/2014] digoxin  0.125 mg Oral QODAY  . diltiazem  300 mg Oral Daily  . escitalopram  20 mg Oral Daily  . fluconazole  150 mg Oral Once  . furosemide  80 mg Intravenous BID  . gabapentin  300 mg Oral Daily  . magnesium chloride  1 tablet Oral BID  . metolazone  5 mg Oral Daily  . metoprolol tartrate  50 mg Oral BID  . nystatin  5 mL Oral QID  . potassium chloride  40 mEq Oral Q4H  . [START ON 12/26/2014] potassium chloride  40 mEq Oral Daily  . predniSONE  30 mg Oral Q breakfast  . sodium chloride  3 mL Intravenous Q12H  . sodium chloride  3 mL Intravenous Q12H  . warfarin  2.5 mg Oral q1800  . Warfarin - Pharmacist Dosing Inpatient   Does not apply q1800    PRN Medications: sodium chloride, acetaminophen **  OR** acetaminophen, levalbuterol, magic mouthwash w/lidocaine, ondansetron **OR** ondansetron (ZOFRAN) IV, oxyCODONE, sodium chloride   Assessment:   1. Acute on chronic diastolic heart failure, still with significant volume overload but diuresing. Lasix was advanced yesterday, creatinine has increased from 1.7 to 1.9.  2. Persistent atrial fibrillation, continue strategy of heart rate control and anticoagulation. No change to current regimen.  3. End-stage interstitial lung disease, on steroid-induced.  4. CKD stage 3, creatinine down to 1.8.  5. Hyperlipidemia, on Lipitor.   Plan/Discussion:    Urine output has dropped off some. Will change Lasix to 60 mg every 8 hours. Follow-up creatinine 1.8. Might consider a Lasix drip if necessary.   Jonelle Sidle, M.D., F.A.C.C.

## 2014-12-25 NOTE — Progress Notes (Signed)
ANTICOAGULATION CONSULT NOTE - Follow Up Consult  Pharmacy Consult for Coumadin Indication: Atrial fibrillation, hx VTE   Allergies  Allergen Reactions  . Pirfenidone Other (See Comments)    Congestion, breathing diffulty   Patient Measurements: Height:  (162.6 cm) Weight: 281 lb 15.5 oz (127.9 kg) IBW/kg (Calculated) : 54.7  Vital Signs: Temp: 98.4 F (36.9 C) (08/14 0800) Temp Source: Oral (08/14 0800) BP: 96/66 mmHg (08/14 0800) Pulse Rate: 52 (08/14 0800)  Labs:  Recent Labs  12/23/14 0216 12/24/14 0313 12/25/14 0355  LABPROT 23.9* 25.2* 27.0*  INR 2.15* 2.32* 2.54*  CREATININE 1.72* 1.93* 1.81*    Estimated Creatinine Clearance: 32.3 mL/min (by C-G formula based on Cr of 1.81).  Assessment: 81 yo female with AFib, on Coumadin pta with reduced INR goal per Cardiology. INR is slightly above this patient's specific goal, 2.5, no s/sx bleeding.  PTA dose: 2.5 mg po daily   Goal of Therapy:  INR 1.8-2.2 Monitor platelets by anticoagulation protocol: Yes   Plan:  - Repeat warfarin 2.5 mg PO x 1  - Daily INR     Agapito Games, PharmD, BCPS Clinical Pharmacist Pager: [phone] 12/25/2014 10:00 AM

## 2014-12-25 NOTE — Progress Notes (Signed)
12/25/2014 patient c/o dizziness while sitting in recliner at 0945.  Per patient she has been having dizziness. Vital sign was taken at 0951 blood pressure 103/61, 72, saturation 88, and respiration 29. Dr Malachi Bonds was made aware. [facility] RN.

## 2014-12-25 NOTE — Progress Notes (Signed)
CRITICAL VALUE ALERT  Critical value received:  Ph (7.425), PC02,  (640.4), P02 (56.8), Bicard (41.4)  Date of notification:  12/25/2014  Time of notification:  1102  Critical value read back:Yes.    Nurse who received alert:  Lovie Macadamia RN  MD notified (1st page):  Dr Malachi Bonds  Time of first page:  1110  MD notified (2nd page):  Time of second page:  Responding MD:  Dr Malachi Bonds  Time MD responded:  [phone]

## 2014-12-26 LAB — BASIC METABOLIC PANEL
Anion gap: 14 (ref 5–15)
BUN: 78 mg/dL — AB (ref 6–20)
CHLORIDE: 87 mmol/L — AB (ref 101–111)
CO2: 40 mmol/L — ABNORMAL HIGH (ref 22–32)
CREATININE: 1.81 mg/dL — AB (ref 0.44–1.00)
Calcium: 9.6 mg/dL (ref 8.9–10.3)
GFR calc Af Amer: 29 mL/min — ABNORMAL LOW (ref >60)
GFR, EST NON AFRICAN AMERICAN: 25 mL/min — AB (ref >60)
GLUCOSE: 93 mg/dL (ref 65–99)
Potassium: 3.2 mmol/L — ABNORMAL LOW (ref 3.5–5.1)
SODIUM: 141 mmol/L (ref 135–145)

## 2014-12-26 LAB — URINALYSIS, ROUTINE W REFLEX MICROSCOPIC
Bilirubin Urine: NEGATIVE
GLUCOSE, UA: NEGATIVE mg/dL
Hgb urine dipstick: NEGATIVE
Ketones, ur: NEGATIVE mg/dL
NITRITE: NEGATIVE
PH: 5 (ref 5.0–8.0)
Protein, ur: NEGATIVE mg/dL
Specific Gravity, Urine: 1.008 (ref 1.005–1.030)
Urobilinogen, UA: 0.2 mg/dL (ref 0.0–1.0)

## 2014-12-26 LAB — PROTIME-INR
INR: 2.1 — AB (ref 0.00–1.49)
Prothrombin Time: 23.4 seconds — ABNORMAL HIGH (ref 11.6–15.2)

## 2014-12-26 LAB — URINE MICROSCOPIC-ADD ON

## 2014-12-26 MED ORDER — FUROSEMIDE 80 MG PO TABS
80.0000 mg | ORAL_TABLET | Freq: Two times a day (BID) | ORAL | Status: DC
  Administered 2014-12-26 – 2014-12-29 (×6): 80 mg via ORAL
  Filled 2014-12-26 (×8): qty 1

## 2014-12-26 MED ORDER — CHLORHEXIDINE GLUCONATE 0.12 % MT SOLN
OROMUCOSAL | Status: AC
Start: 2014-12-26 — End: 2014-12-26
  Administered 2014-12-26: 15 mL via OROMUCOSAL
  Filled 2014-12-26: qty 15

## 2014-12-26 MED ORDER — POTASSIUM CHLORIDE CRYS ER 20 MEQ PO TBCR
40.0000 meq | EXTENDED_RELEASE_TABLET | Freq: Two times a day (BID) | ORAL | Status: DC
  Administered 2014-12-26 (×2): 40 meq via ORAL
  Filled 2014-12-26 (×3): qty 2

## 2014-12-26 NOTE — Progress Notes (Signed)
Patient ID: [name] [name], female   DOB: 21-Sep-1931, 81 y.o.   MRN: [id number]    Primary cardiologist: Dr. Dossie Arbour  Seen for followup: Diastolic failure, atrial fibrillation  Subjective:    Dyspnea wit face mask on sitting in chair   Objective:   Temp:  [97.8 F (36.6 C)-98.1 F (36.7 C)] 97.9 F (36.6 C) (08/15 0447) Pulse Rate:  [27-98] 71 (08/15 0719) Resp:  [19-30] 27 (08/15 0719) BP: (80-145)/(37-122) 98/50 mmHg (08/15 0719) SpO2:  [86 %-100 %] 91 % (08/15 0719) FiO2 (%):  [40 %] 40 % (08/15 0719) Weight:  [129.4 kg (285 lb 4.4 oz)] 129.4 kg (285 lb 4.4 oz) (08/15 0447) Last BM Date: 12/25/14  Filed Weights   12/24/14 0320 12/25/14 0500 12/26/14 0447  Weight: 123.1 kg (271 lb 6.2 oz) 127.9 kg (281 lb 15.5 oz) 129.4 kg (285 lb 4.4 oz)    Intake/Output Summary (Last 24 hours) at 12/26/14 0807 Last data filed at 12/26/14 0450  Gross per 24 hour  Intake    366 ml  Output   1900 ml  Net  -1534 ml    Telemetry: Rate-controlled atrial fibrillation. 60-80   Exam:   General: Morbidly obese, no distress.  Lungs: Coarse breath sounds, diminished.  Cardiac: Irregularly irregular without gallop.  Abdomen: Obese, nontender.  Extremities: Significant, chronic appearing leg edema and stasis.  Lab Results:  Basic Metabolic Panel:  Recent Labs Lab 12/21/14 0306  12/24/14 0313 12/25/14 0355 12/26/14 0242  NA 139  < > 139 140 141  K 2.8*  < > 4.1 2.9* 3.2*  CL 91*  < > 89* 88* 87*  CO2 38*  < > 39* 38* 40*  GLUCOSE 98  < > 110* 104* 93  BUN 56*  < > 69* 76* 78*  CREATININE 1.79*  < > 1.93* 1.81* 1.81*  CALCIUM 9.1  < > 9.5 9.4 9.6  MG 2.2  --   --   --   --   < > = values in this interval not displayed.  CBC:  Recent Labs Lab 12/20/14 0435 12/21/14 0306 12/22/14 0348  WBC 7.7 7.1 7.0  HGB 12.7 11.9* 12.3  HCT 39.7 37.1 38.1  MCV 102.3* 101.9* 103.5*  PLT 155 175 195    Coagulation:  Recent Labs Lab 12/24/14 0313 12/25/14 0355  12/26/14 0242  INR 2.32* 2.54* 2.10*    Medications:   Scheduled Medications: . allopurinol  300 mg Oral Daily  . antiseptic oral rinse  7 mL Mouth Rinse q12n4p  . atorvastatin  10 mg Oral Daily  . budesonide  0.5 mg Nebulization BID  . chlorhexidine gluconate  15 mL Mouth Rinse BID  . digoxin  0.125 mg Oral QODAY  . diltiazem  300 mg Oral Daily  . escitalopram  20 mg Oral Daily  . furosemide  60 mg Intravenous 3 times per day  . gabapentin  300 mg Oral Daily  . magnesium chloride  1 tablet Oral BID  . metolazone  5 mg Oral Daily  . metoprolol tartrate  50 mg Oral BID  . nystatin  5 mL Oral QID  . potassium chloride  40 mEq Oral BID  . predniSONE  30 mg Oral Q breakfast  . sodium chloride  3 mL Intravenous Q12H  . sodium chloride  3 mL Intravenous Q12H  . warfarin  2.5 mg Oral q1800  . Warfarin - Pharmacist Dosing Inpatient   Does not apply q1800    PRN  Medications: sodium chloride, acetaminophen **OR** acetaminophen, levalbuterol, magic mouthwash w/lidocaine, ondansetron **OR** ondansetron (ZOFRAN) IV, oxyCODONE, sodium chloride   Assessment:   1. Acute on chronic diastolic heart failure, still with significant volume overload but diuresing. Lasix was advanced yesterday, creatinine has increased from 1.7 to 1.9.  2. Persistent atrial fibrillation, continue strategy of heart rate control and anticoagulation. No change to current regimen.  3. End-stage interstitial lung disease, on steroid-induced.  4. CKD stage 3, creatinine down to 1.8.  5. Hyperlipidemia, on Lipitor.   Plan/Discussion:    On Rx anticoagulation  Rate control surprisingly good given lung disease.  Continue with lasix dose adjusted yesterday.  Will sign off not much to  Add as primary issue is end stage lung disease    Charlton Haws

## 2014-12-26 NOTE — Progress Notes (Signed)
Physical Therapy Treatment Patient Details Name: [name] [name] MRN: [id number] DOB: 11-Feb-1932 Today's Date: 12/26/2014    History of Present Illness Patient is an 81 year old female with a past medical history of end-stage pulmonary fibrosis, chronic diastolic congestive heart failure, chronic hypoxemic respiratory failure, atrial fibrillation who was admitted to the medicine service on 12/04/2014 when she presented with complaints of a generalized weakness and functional decline. She had a recent hospitalization for acute on chronic hypoxemic respiratory failure believed to be secondary to acute flareup of end-stage interstitial lung disease and viral infection. She was discharged to inpatient rehabilitation. She was discharged from inpatient rehabilitation on 11/24/2014 and had been doing poorly at home. She was found to be in atrial fibrillation rapid ventricular response, encephalopathic, having sepsis.  Dx wth UTI.     PT Comments    On arrival pt in chair on 6L [place] with sats 83%. Pt had been on Bipap at night and transferred to [facility]. With RN report of varying delivery devices yesterday. Transitioned to 15L NRB with sats 97% and able to tolerate limited gait and exercise on NRB. sats 87-97% with gait and 90-94% with HEp. End of session returned to 6L  sitting with sats 92%. HR 103 with activity. Pt happy and eager to walk with education for HEP and current oxygen demand. Pt remains insistent on return home.   Follow Up Recommendations  SNF;Supervision for mobility/OOB     Equipment Recommendations       Recommendations for Other Services       Precautions / Restrictions Precautions Precautions: Fall Precaution Comments: monitor O2 sats and HR Restrictions Weight Bearing Restrictions: No    Mobility  Bed Mobility               General bed mobility comments: in chair on arrival  Transfers Overall transfer level: Needs assistance   Transfers: Sit to/from Stand Sit to  Stand: Min guard;Min assist         General transfer comment: minguard to stand from recliner, min assist to stand from chair without armrests  Ambulation/Gait Ambulation/Gait assistance: Min guard Ambulation Distance (Feet): 18 Feet Assistive device: Rolling walker (2 wheeled) Gait Pattern/deviations: Step-through pattern;Decreased stride length;Trunk flexed   Gait velocity interpretation: Below normal speed for age/gender General Gait Details: Pt walked across room, sat in chair then returned to recliner with grossly 4 min seated recovery between trials with need for 15L NRB to maintain sats in the 90s. Pt with sats 94-97% on first gait trial and 93% on 2nd trial with drop to 87% immediately after sitting in recliner end of second trial   Stairs            Wheelchair Mobility    Modified Rankin (Stroke Patients Only)       Balance Overall balance assessment: Needs assistance   Sitting balance-Leahy Scale: Good       Standing balance-Leahy Scale: Poor                      Cognition Arousal/Alertness: Awake/alert Behavior During Therapy: WFL for tasks assessed/performed Overall Cognitive Status: Within Functional Limits for tasks assessed                      Exercises General Exercises - Lower Extremity Ankle Circles/Pumps: AROM;Seated;Both;20 reps Long Arc Quad: AROM;Both;Seated;20 reps Hip ABduction/ADduction: AROM;Both;20 reps;Seated Hip Flexion/Marching: AROM;Both;Seated;20 reps    General Comments        Pertinent Vitals/Pain Pain  Assessment: No/denies pain    Home Living                      Prior Function            PT Goals (current goals can now be found in the care plan section) Acute Rehab PT Goals Patient Stated Goal: return home PT Goal Formulation: With patient Time For Goal Achievement: 01/09/15 Potential to Achieve Goals: Fair Progress towards PT goals: Progressing toward goals (goals updated )     Frequency       PT Plan Current plan remains appropriate    Co-evaluation             End of Session Equipment Utilized During Treatment: Oxygen Activity Tolerance: Patient limited by fatigue Patient left: in chair;with call bell/phone within reach     Time: 0744-0808 PT Time Calculation (min) (ACUTE ONLY): 24 min  Charges:  $Gait Training: 8-22 mins $Therapeutic Exercise: 8-22 mins                    G Codes:      Delorse Lek 12-28-2014, 11:09 AM Delaney Meigs, PT [phone]

## 2014-12-26 NOTE — Progress Notes (Signed)
ANTICOAGULATION CONSULT NOTE - Follow Up Consult  Pharmacy Consult for Coumadin Indication: Atrial fibrillation, hx VTE   Allergies  Allergen Reactions  . Pirfenidone Other (See Comments)    Congestion, breathing diffulty   Patient Measurements: Height:  (162.6 cm) Weight: 285 lb 4.4 oz (129.4 kg) IBW/kg (Calculated) : 54.7  Vital Signs: Temp: 97.2 F (36.2 C) (08/15 1200) Temp Source: Axillary (08/15 1200) BP: 100/63 mmHg (08/15 1200) Pulse Rate: 61 (08/15 1200)  Labs:  Recent Labs  12/24/14 0313 12/25/14 0355 12/26/14 0242  LABPROT 25.2* 27.0* 23.4*  INR 2.32* 2.54* 2.10*  CREATININE 1.93* 1.81* 1.81*    Estimated Creatinine Clearance: 32.6 mL/min (by C-G formula based on Cr of 1.81).  Assessment: 81 yo female with AFib, on Coumadin pta with reduced INR goal per Cardiology. INR is within this patient's modified goal range, no s/sx bleeding.  PTA dose: 2.5 mg po daily   Goal of Therapy:  INR 1.8-2.2 Monitor platelets by anticoagulation protocol: Yes   Plan:  Continue Coumadin 2.5mg  daily Daily PT/INR  Estella Husk, Pharm.D., BCPS, AAHIVP Clinical Pharmacist Phone: [phone] or [phone] 12/26/2014, 1:04 PM

## 2014-12-26 NOTE — Care Management Important Message (Signed)
Important Message  Patient Details  Name: [name] [name] MRN: [id number] Date of Birth: Feb 03, 1932   Medicare Important Message Given:  Other (see comment)    Hanley Hays, RN 12/26/2014, 11:04 AM

## 2014-12-26 NOTE — Progress Notes (Signed)
Pt taken off bipap and placed on 6L [place] per her request. RT will continue to monitor

## 2014-12-26 NOTE — Progress Notes (Signed)
[facility] PROGRESS NOTE  [name] [name] ZOX:[id number] DOB: [birth year] DOA: 12/04/2014 PCP: Kerby Nora, MD  Brief Summary  81 y.o. female with a past medical history of pulmonary fibrosis/interstitial pneumonitis, diastolic congestive heart failure, chronic hypoxemic respiratory failure, atrial fibrillation on chronic anticoagulation who was recently admitted to the pulmonary critical care medicine service on 11/16/2014 discharged on 11/24/2014 to acute rehabilitation. During the hospitalization she initially presented with acute on chronic respiratory failure with hypoxemia which was believed to be secondary to acute flare up of end-stage interstitial lung disease. She was treated with IV steroids and broad-spectrum IV antimicrobial therapy. He was found to have a parainfluenza viral infection. She showed slow clinical improvement however becoming debilitated from prolonged hospitalization. She was discharged to inpatient rehabilitation on 11/24/2014. Readmitted on 12/04/14 due to sepsis from UTI and A. Fib with RVR. Course complicated with acute on chronic diastolic heart failure and pulmonary edema. Patient Being diuresed currently. PCCM and cardiology on board. She finished treatment for UTI, sepsis.   Assessment/Plan  Acute on chronic hypoxic respiratory failure secondary to acute on chronic diastolic congestive heart failure superimposed on underlying ILD.  Per cardiologist, she is probably close to her "dry weight" despite having impressive lower extremity edema since her BUN and creatinine have been rising.   - Transthoracic echocardiogram performed on 11/17/2014 showed preserved ejection fraction. - Most likely due to A. fib - Weights have been trending up -  Neg 2.2 L yesterday -  Cardiology has signed off.  I discussed the case with Dr. Eden Emms who recommended transitioning to lasix 80mg  PO BID and continuing metolazone -  We do not anticipate more improvement in her breathing.   She continues to have desats down to the 70s with exertion despite 6-8L [place] and she has been placed on NRB intermittently.  She declines LTACH and SNF and wants to go home.  She wants to live another 5 years and is not interested in talking about goals of care at this time.   -  Transition to PO lasix 80mg  BID with metolazone -  Monitor for another day or two to see if her breathing and weight worsen and d/c if she appears stable to home with home health services.    Atrial fibrillation with rapid ventricular response triggered by ILD on chronic anticoagulation -  Tele:  Rate controlled a-fib - continue cardizem, metoprolol 25 mg, and Digoxin - CHADVASC score 5 - INR at goal. -  Appreciate Pharmacy assistance  -  Appreciate EP assistance   End-stage interstitial lung disease followed by pulmonology on high dose chronic steroids. -  Chest x-ray demonstrated pulmonary edema; no infiltrates  - continue tapering steroids slowly, currently on prednisone 30mg  and will be discharged on this dose with follow up with pulmonology -  PCCM signed off.  Sepsis due to Proteus mirabilis and klebsiella Pneumoniae urinary tract infection, present at time of admission, with leukocytosis and tachycardia and lactic acid 4.15.  Resolved and completed course of antibiotics in hospital.  WBC normal.    Hypokalemia due to diuresis.  Oral potassium repletion  Thrush, improving, continue patient on nystatin and magic mouthwash  CKD, stage III, creatinine down to 1.81.  BUN is rising.  Agree with sacrificing some kidney function in interest of preserving breathing.    Dysuria -  Check UA and culture -  Fluconazole x 1  Diet:  Healthy heart Access:  Peripheral IV IVF:  Off Proph:  Warfarin  Code Status: Full code.  Family Communication: Patient alone Disposition Plan:  To home if weight stable and continuing to diurese   Consultation:  Cardiology   PCCM  EP  Antibiotics:  Vancomycin  7/24>>7/27  Elita Quick 7/24>>7/27  Rocephin 7/27>>7/32  Ceftin 7/31>>8/3  HPI/Subjective:  Continues to be very edematous. States she feels like she has more energy and was able to walk to with PT to the door.  O2 sats continue to drop intermittently.    Objective: Filed Vitals:   12/26/14 0719 12/26/14 0830 12/26/14 1053 12/26/14 1054  BP:   Pulse: 71 67 81 81  Temp:  96.9 F (36.1 C)    TempSrc:  Axillary    Resp: 27 28    Height:      Weight:      SpO2: 91% 80%      Intake/Output Summary (Last 24 hours) at 12/26/14 1122 Last data filed at 12/26/14 1057  Gross per 24 hour  Intake    552 ml  Output   2650 ml  Net  -2098 ml   Filed Weights   12/24/14 0320 12/25/14 0500 12/26/14 0447  Weight: 123.1 kg (271 lb 6.2 oz) 127.9 kg (281 lb 15.5 oz) 129.4 kg (285 lb 4.4 oz)   Body mass index is 48.94 kg/(m^2).  Exam:   General:  Obese female, NAD, breathlessness when talking, nasal cannula in place with 6 L O2, sitting in chair, O2 sat in the low to mid-80s, pursed lip breathing  HEENT:  NCAT, MMM, cushingoid faces  Cardiovascular:  IRRR, nl S1, S2 no mrg, 2+ pulses, warm extremities  Respiratory:  Very diminished bilateral breath sounds, persistent rales at the left base, no increased WOB  Abdomen:   NABS, soft, NT/ND  MSK:   Normal tone and bulk, 2+ pitting bilateral LEE  Data Reviewed: Basic Metabolic Panel:  Recent Labs Lab 12/21/14 0306 12/22/14 0348 12/23/14 0216 12/24/14 0313 12/25/14 0355 12/26/14 0242  NA 139 141 140 139 140 141  K 2.8* 2.9* 4.0 4.1 2.9* 3.2*  CL 91* 90* 92* 89* 88* 87*  CO2 38* 39* 36* 39* 38* 40*  GLUCOSE 98 96 97 110* 104* 93  BUN 56* 57* 64* 69* 76* 78*  CREATININE 1.79* 1.79* 1.72* 1.93* 1.81* 1.81*  CALCIUM 9.1 9.3 9.2 9.5 9.4 9.6  MG 2.2  --   --   --   --   --    Liver Function Tests: No results for input(s): AST, ALT, ALKPHOS, BILITOT, PROT, ALBUMIN in the last 168 hours. No results for input(s):  LIPASE, AMYLASE in the last 168 hours. No results for input(s): AMMONIA in the last 168 hours. CBC:  Recent Labs Lab 12/20/14 0435 12/21/14 0306 12/22/14 0348  WBC 7.7 7.1 7.0  HGB 12.7 11.9* 12.3  HCT 39.7 37.1 38.1  MCV 102.3* 101.9* 103.5*  PLT 155 175 195    No results found for this or any previous visit (from the past 240 hour(s)).   Studies: Dg Chest Port 1 View  12/25/2014   CLINICAL DATA:  81 year old female with history of hypoxia.  EXAM: PORTABLE CHEST - 1 VIEW  COMPARISON:  Chest x-ray 12/14/2014.  FINDINGS: Lung volumes are low. Atelectasis and/or consolidation of the left lung base. Small left pleural effusion. Diffusely coarsened interstitial markings appear to be chronic compared to numerous prior examinations. Cephalization of the pulmonary vasculature. Mild cardiomegaly. The patient is rotated to the left on today's exam, resulting in distortion of the mediastinal contours and  reduced diagnostic sensitivity and specificity for mediastinal pathology. Atherosclerosis in the thoracic aorta.  IMPRESSION: 1. Study is limited by are low lung volumes and patient positioning. There appears to be atelectasis and/or consolidation in the left lower lobe with a small left pleural effusion. 2. Cardiomegaly with cephalization of the pulmonary vasculature. There is indistinctness of the interstitial markings which may suggest mild congestive heart failure, however, the coarsened interstitial markings may alternatively be chronic. 3. Atherosclerosis.   Electronically Signed   By: Trudie Reed M.D.   On: 12/25/2014 11:03    Scheduled Meds: . allopurinol  300 mg Oral Daily  . antiseptic oral rinse  7 mL Mouth Rinse q12n4p  . atorvastatin  10 mg Oral Daily  . budesonide  0.5 mg Nebulization BID  . chlorhexidine gluconate  15 mL Mouth Rinse BID  . digoxin  0.125 mg Oral QODAY  . diltiazem  300 mg Oral Daily  . escitalopram  20 mg Oral Daily  . furosemide  60 mg Intravenous 3 times  per day  . gabapentin  300 mg Oral Daily  . magnesium chloride  1 tablet Oral BID  . metolazone  5 mg Oral Daily  . metoprolol tartrate  50 mg Oral BID  . nystatin  5 mL Oral QID  . potassium chloride  40 mEq Oral BID  . predniSONE  30 mg Oral Q breakfast  . sodium chloride  3 mL Intravenous Q12H  . sodium chloride  3 mL Intravenous Q12H  . warfarin  2.5 mg Oral q1800  . Warfarin - Pharmacist Dosing Inpatient   Does not apply q1800   Continuous Infusions:   Principal Problem:   Sepsis Active Problems:   CKD (chronic kidney disease) stage 3, GFR 30-59 ml/min   Interstitial lung disease   Debilitated   UTI (lower urinary tract infection)   Atrial fibrillation with RVR   FTT (failure to thrive) in adult   Blood poisoning   Weakness   Acute on chronic respiratory failure with hypoxia   Acute diastolic CHF (congestive heart failure)   Persistent atrial fibrillation    Time spent: 30 min    Rosezella Kronick  [facility] Pager [phone]. If 7PM-7AM, please contact night-coverage at [email or web address], password [facility] 12/26/2014, 11:22 AM  LOS: 22 days

## 2014-12-27 LAB — BASIC METABOLIC PANEL
Anion gap: 11 (ref 5–15)
BUN: 78 mg/dL — AB (ref 6–20)
CHLORIDE: 86 mmol/L — AB (ref 101–111)
CO2: 44 mmol/L — ABNORMAL HIGH (ref 22–32)
CREATININE: 1.81 mg/dL — AB (ref 0.44–1.00)
Calcium: 9.5 mg/dL (ref 8.9–10.3)
GFR, EST AFRICAN AMERICAN: 29 mL/min — AB (ref >60)
GFR, EST NON AFRICAN AMERICAN: 25 mL/min — AB (ref >60)
Glucose, Bld: 103 mg/dL — ABNORMAL HIGH (ref 65–99)
Potassium: 2.8 mmol/L — ABNORMAL LOW (ref 3.5–5.1)
SODIUM: 141 mmol/L (ref 135–145)

## 2014-12-27 LAB — URINE CULTURE

## 2014-12-27 LAB — PROTIME-INR
INR: 2.06 — AB (ref 0.00–1.49)
Prothrombin Time: 23.1 seconds — ABNORMAL HIGH (ref 11.6–15.2)

## 2014-12-27 MED ORDER — POTASSIUM CHLORIDE CRYS ER 20 MEQ PO TBCR
40.0000 meq | EXTENDED_RELEASE_TABLET | Freq: Three times a day (TID) | ORAL | Status: DC
  Administered 2014-12-27 – 2014-12-29 (×7): 40 meq via ORAL
  Filled 2014-12-27 (×7): qty 2

## 2014-12-27 NOTE — Progress Notes (Signed)
ANTICOAGULATION CONSULT NOTE - Follow Up Consult  Pharmacy Consult for Coumadin Indication: Atrial fibrillation, hx VTE   Allergies  Allergen Reactions  . Pirfenidone Other (See Comments)    Congestion, breathing diffulty   Patient Measurements: Height:  (162.6 cm) Weight: 280 lb 3.3 oz (127.1 kg) IBW/kg (Calculated) : 54.7  Vital Signs: Temp: 97.6 F (36.4 C) (08/16 0339) Temp Source: Axillary (08/16 0800) BP: 115/56 mmHg (08/16 0800) Pulse Rate: 64 (08/16 0800)  Labs:  Recent Labs  12/25/14 0355 12/26/14 0242 12/27/14 0535  LABPROT 27.0* 23.4* 23.1*  INR 2.54* 2.10* 2.06*  CREATININE 1.81* 1.81* 1.81*    Estimated Creatinine Clearance: 32.2 mL/min (by C-G formula based on Cr of 1.81).  Assessment: 81 yo female with AFib, on Coumadin pta with reduced INR goal per Cardiology. INR is within this patient's modified goal range, no s/sx bleeding.  Potassium remains low. Supplement was increased.  Her last magnesium level was on 8/10.   She is also on digoxin and is at risk of toxicity due to renal insufficiency.  Her last level was at the upper end of the therapeutic range on 8/7.  Her dose has been decreased twice since that time.  She will need to be at steady state before reassessing her digoxin level.  PTA dose: 2.5 mg po daily   Goal of Therapy:  INR 1.8-2.2 Monitor platelets by anticoagulation protocol: Yes   Plan:  Continue Coumadin 2.5mg  daily Daily PT/INR Check Magnesium with AM labs Check digoxin level in ~1 week.  Estella Husk, Pharm.D., BCPS, AAHIVP Clinical Pharmacist Phone: [phone] or [phone] 12/27/2014, 10:18 AM

## 2014-12-27 NOTE — Progress Notes (Signed)
Placed patient on BIPAP for the night. She is tolerating it well and vitals are stable. RT will continue to monitor.

## 2014-12-27 NOTE — Progress Notes (Signed)
Patient ID: TATISHA CERINO, female   DOB: 10/12/1931, 81 y.o.   MRN: [id number]    Primary cardiologist: Dr. Dossie Arbour  Seen for followup: Diastolic failure, atrial fibrillation  Subjective:    Lethargic CPAP in bed   Objective:   Temp:  [96.9 F (36.1 C)-97.8 F (36.6 C)] 97.6 F (36.4 C) (08/16 0339) Pulse Rate:  [60-99] 70 (08/16 0339) Resp:  [19-30] 25 (08/16 0339) BP: (98-136)/(50-73) 136/71 mmHg (08/16 0339) SpO2:  [80 %-96 %] 96 % (08/16 0339) FiO2 (%):  [50 %] 50 % (08/16 0339) Last BM Date: 12/25/14  Filed Weights   12/24/14 0320 12/25/14 0500 12/26/14 0447  Weight: 123.1 kg (271 lb 6.2 oz) 127.9 kg (281 lb 15.5 oz) 129.4 kg (285 lb 4.4 oz)    Intake/Output Summary (Last 24 hours) at 12/27/14 0744 Last data filed at 12/27/14 0330  Gross per 24 hour  Intake    769 ml  Output   2376 ml  Net  -1607 ml    Telemetry: Rate-controlled atrial fibrillation. 60-80 12/27/2014   Exam:   General: Morbidly obese, no distress.  Lungs: Coarse breath sounds, diminished.  Cardiac: Irregularly irregular without gallop.  Abdomen: Obese, nontender.  Extremities: Significant, chronic appearing leg edema and stasis.  Lab Results:  Basic Metabolic Panel:  Recent Labs Lab 12/21/14 0306  12/25/14 0355 12/26/14 0242 12/27/14 0535  NA 139  < > 140 141 141  K 2.8*  < > 2.9* 3.2* 2.8*  CL 91*  < > 88* 87* 86*  CO2 38*  < > 38* 40* 44*  GLUCOSE 98  < > 104* 93 103*  BUN 56*  < > 76* 78* 78*  CREATININE 1.79*  < > 1.81* 1.81* 1.81*  CALCIUM 9.1  < > 9.4 9.6 9.5  MG 2.2  --   --   --   --   < > = values in this interval not displayed.  CBC:  Recent Labs Lab 12/21/14 0306 12/22/14 0348  WBC 7.1 7.0  HGB 11.9* 12.3  HCT 37.1 38.1  MCV 101.9* 103.5*  PLT 175 195    Coagulation:  Recent Labs Lab 12/25/14 0355 12/26/14 0242 12/27/14 0535  INR 2.54* 2.10* 2.06*    Medications:   Scheduled Medications: . allopurinol  300 mg Oral Daily  . antiseptic  oral rinse  7 mL Mouth Rinse q12n4p  . atorvastatin  10 mg Oral Daily  . budesonide  0.5 mg Nebulization BID  . chlorhexidine gluconate  15 mL Mouth Rinse BID  . digoxin  0.125 mg Oral QODAY  . diltiazem  300 mg Oral Daily  . escitalopram  20 mg Oral Daily  . furosemide  80 mg Oral BID  . gabapentin  300 mg Oral Daily  . magnesium chloride  1 tablet Oral BID  . metolazone  5 mg Oral Daily  . metoprolol tartrate  50 mg Oral BID  . nystatin  5 mL Oral QID  . potassium chloride  40 mEq Oral TID  . predniSONE  30 mg Oral Q breakfast  . sodium chloride  3 mL Intravenous Q12H  . sodium chloride  3 mL Intravenous Q12H  . warfarin  2.5 mg Oral q1800  . Warfarin - Pharmacist Dosing Inpatient   Does not apply q1800    PRN Medications: sodium chloride, acetaminophen **OR** acetaminophen, levalbuterol, magic mouthwash w/lidocaine, ondansetron **OR** ondansetron (ZOFRAN) IV, oxyCODONE, sodium chloride   Assessment:   1. Acute on chronic diastolic heart  failure I/O negative but weight up ? Continue current lasix dose and zaroxyln.  Significantly prerenal  2. Persistent atrial fibrillation, continue strategy of heart rate control and anticoagulation. No change to current regimen. INR Rx  3. End-stage interstitial lung disease, on steroid-induced.  4. CKD stage 3, creatinine down to 1.8.  5. Hyperlipidemia, on Lipitor.   Plan/Discussion:    On Rx anticoagulation  Rate control surprisingly good given lung disease.  Continue diuretics .  Will sign off not much to  Add as primary issue is end stage lung disease    Charlton Haws

## 2014-12-27 NOTE — Progress Notes (Signed)
[facility] PROGRESS NOTE  KLOEE BALLEW ZOX:[id number] DOB: 03-01-1932 DOA: 12/04/2014 PCP: Kerby Nora, MD  Brief Summary  81 y.o. female with a past medical history of pulmonary fibrosis/interstitial pneumonitis, diastolic congestive heart failure, chronic hypoxemic respiratory failure, atrial fibrillation on chronic anticoagulation who was recently admitted to the pulmonary critical care medicine service on 11/16/2014 discharged on 11/24/2014 to acute rehabilitation. During the hospitalization she initially presented with acute on chronic respiratory failure with hypoxemia which was believed to be secondary to acute flare up of end-stage interstitial lung disease. She was treated with IV steroids and broad-spectrum IV antimicrobial therapy. He was found to have a parainfluenza viral infection. She showed slow clinical improvement however becoming debilitated from prolonged hospitalization. She was discharged to inpatient rehabilitation on 11/24/2014. Readmitted on 12/04/14 due to sepsis from UTI and A. Fib with RVR. Course complicated with acute on chronic diastolic heart failure and pulmonary edema. Patient Being diuresed currently. PCCM and cardiology on board. She finished treatment for UTI, sepsis.  Unfortunately, she still remains SOB with minimal exertion, intermittently requires NRB.  She is not interested in palliative care, LTACH, SNF, and states she will go home with home health services.  Discussed GOC and possibility of not coming off the ventilator if she were to get intubated, but she remains in denial about the possibility and states she is going to live 4 more years.  We talked about not being able to push her diuresis because of her kidney function, but she was adamant that we not sacrifice her kidneys, but that we get rid of the fluid.  I am nervous about ordering Unna boots since I think the fluid shift may make her more [name] [name] of breath.  Need to supplement potassium today, but  she will need to be discharged home with [facility] services at her request since there is nothing further that we can do to support or improve her breathing in the hospital at this point and she does not currently need tracheostomy or intubation.    Assessment/Plan  Acute on chronic hypoxic respiratory failure secondary to acute on chronic diastolic congestive heart failure superimposed on underlying ILD.  Per cardiologist, she is probably close to her "dry weight" despite having impressive lower extremity edema since her BUN and creatinine have been rising.   - Transthoracic echocardiogram performed on 11/17/2014 showed preserved ejection fraction. - Most likely due to A. fib - Weights have stabilized around 280-lbs, creatinine stable around 1.8 and BUN remains elevated -  Transitioned to PO lasix late on 8/15 and weight is so far unchanged -  Appreciate cardiology and pulmonology assistance.  Atrial fibrillation with rapid ventricular response triggered by ILD on chronic anticoagulation -  Tele:  Rate controlled a-fib - continue cardizem, metoprolol 25 mg, and Digoxin - CHADVASC score 5 - INR at goal. -  Appreciate Pharmacy assistance  -  Appreciate EP assistance   End-stage interstitial lung disease followed by pulmonology on high dose chronic steroids. -  Chest x-ray demonstrated pulmonary edema vs. ILD; no infiltrates  - continue tapering steroids very slowly over many months, currently on prednisone 30mg  and will be discharged on this dose with follow up with pulmonology -  PCCM signed off.  Sepsis due to Proteus mirabilis and klebsiella Pneumoniae urinary tract infection, present at time of admission, with leukocytosis and tachycardia and lactic acid 4.15.  Resolved and completed course of antibiotics in hospital.  WBC normal.    Hypokalemia due to diuresis.  Oral potassium  repletion increased to TID  Thrush, improving, continue patient on nystatin and magic mouthwash  CKD, stage III,  creatinine down to 1.81.  BUN is rising.  Agree with sacrificing some kidney function in interest of preserving breathing.    Dysuria -  UA neg and culture pending -  Fluconazole x 1  Diet:  Healthy heart Access:  Peripheral IV IVF:  Off Proph:  Warfarin  Code Status: Full code.  Family Communication: Patient alone Disposition Plan:  To home on Wednesday if potassium okay and weight stable  And I/O stable to mildly negative.     Consultation:  Cardiology   PCCM  EP  Antibiotics:  Vancomycin 7/24>>7/27  Elita Quick 7/24>>7/27  Rocephin 7/27>>7/32  Ceftin 7/31>>8/3  HPI/Subjective:  Continues to be very edematous.  Drops O2 sats to low 80s on 6L and intermittently requiring NRB.  Objective: Filed Vitals:   12/26/14 2245 12/27/14 0012 12/27/14 0055 12/27/14 0339  BP: 121/59 121/59 135/71 136/71  Pulse: 65 67 60 70  Temp:   97.8 F (36.6 C) 97.6 F (36.4 C)  TempSrc:   Axillary Axillary  Resp:  Height:      Weight:      SpO2:  96% 95% 96%    Intake/Output Summary (Last 24 hours) at 12/27/14 0655 Last data filed at 12/26/14 2246  Gross per 24 hour  Intake    769 ml  Output   1001 ml  Net   -232 ml   Filed Weights   12/24/14 0320 12/25/14 0500 12/26/14 0447  Weight: 123.1 kg (271 lb 6.2 oz) 127.9 kg (281 lb 15.5 oz) 129.4 kg (285 lb 4.4 oz)   Body mass index is 48.94 kg/(m^2).  Exam:   General:  Obese female, NAD, breathlessness when talking, nasal cannula in place with 6 L O2, sitting in chair  HEENT:  NCAT, MMM, cushingoid faces  Cardiovascular:  IRRR, nl S1, S2 no mrg, 2+ pulses, warm extremities  Respiratory:  Very diminished bilateral breath sounds, persistent rales at the left base, no increased WOB  Abdomen:   NABS, soft, NT/ND  MSK:   Normal tone and bulk, 2+ pitting bilateral LEE  Data Reviewed: Basic Metabolic Panel:  Recent Labs Lab 12/21/14 0306 12/22/14 0348 12/23/14 0216 12/24/14 0313 12/25/14 0355 12/26/14 0242   NA 139 141 140 139 140 141  K 2.8* 2.9* 4.0 4.1 2.9* 3.2*  CL 91* 90* 92* 89* 88* 87*  CO2 38* 39* 36* 39* 38* 40*  GLUCOSE 98 96 97 110* 104* 93  BUN 56* 57* 64* 69* 76* 78*  CREATININE 1.79* 1.79* 1.72* 1.93* 1.81* 1.81*  CALCIUM 9.1 9.3 9.2 9.5 9.4 9.6  MG 2.2  --   --   --   --   --    Liver Function Tests: No results for input(s): AST, ALT, ALKPHOS, BILITOT, PROT, ALBUMIN in the last 168 hours. No results for input(s): LIPASE, AMYLASE in the last 168 hours. No results for input(s): AMMONIA in the last 168 hours. CBC:  Recent Labs Lab 12/21/14 0306 12/22/14 0348  WBC 7.1 7.0  HGB 11.9* 12.3  HCT 37.1 38.1  MCV 101.9* 103.5*  PLT 175 195    No results found for this or any previous visit (from the past 240 hour(s)).   Studies: Dg Chest Port 1 View  12/25/2014   CLINICAL DATA:  81 year old female with history of hypoxia.  EXAM: PORTABLE CHEST - 1 VIEW  COMPARISON:  Chest x-ray  12/14/2014.  FINDINGS: Lung volumes are low. Atelectasis and/or consolidation of the left lung base. Small left pleural effusion. Diffusely coarsened interstitial markings appear to be chronic compared to numerous prior examinations. Cephalization of the pulmonary vasculature. Mild cardiomegaly. The patient is rotated to the left on today's exam, resulting in distortion of the mediastinal contours and reduced diagnostic sensitivity and specificity for mediastinal pathology. Atherosclerosis in the thoracic aorta.  IMPRESSION: 1. Study is limited by are low lung volumes and patient positioning. There appears to be atelectasis and/or consolidation in the left lower lobe with a small left pleural effusion. 2. Cardiomegaly with cephalization of the pulmonary vasculature. There is indistinctness of the interstitial markings which may suggest mild congestive heart failure, however, the coarsened interstitial markings may alternatively be chronic. 3. Atherosclerosis.   Electronically Signed   By: Trudie Reed  M.D.   On: 12/25/2014 11:03    Scheduled Meds: . allopurinol  300 mg Oral Daily  . antiseptic oral rinse  7 mL Mouth Rinse q12n4p  . atorvastatin  10 mg Oral Daily  . budesonide  0.5 mg Nebulization BID  . chlorhexidine gluconate  15 mL Mouth Rinse BID  . digoxin  0.125 mg Oral QODAY  . diltiazem  300 mg Oral Daily  . escitalopram  20 mg Oral Daily  . furosemide  80 mg Oral BID  . gabapentin  300 mg Oral Daily  . magnesium chloride  1 tablet Oral BID  . metolazone  5 mg Oral Daily  . metoprolol tartrate  50 mg Oral BID  . nystatin  5 mL Oral QID  . potassium chloride  40 mEq Oral BID  . predniSONE  30 mg Oral Q breakfast  . sodium chloride  3 mL Intravenous Q12H  . sodium chloride  3 mL Intravenous Q12H  . warfarin  2.5 mg Oral q1800  . Warfarin - Pharmacist Dosing Inpatient   Does not apply q1800   Continuous Infusions:   Principal Problem:   Sepsis Active Problems:   CKD (chronic kidney disease) stage 3, GFR 30-59 ml/min   Interstitial lung disease   Debilitated   UTI (lower urinary tract infection)   Atrial fibrillation with RVR   FTT (failure to thrive) in adult   Blood poisoning   Weakness   Acute on chronic respiratory failure with hypoxia   Acute diastolic CHF (congestive heart failure)   Persistent atrial fibrillation    Time spent: 30 min    Anastasiya Gowin  [facility] Pager [phone]. If 7PM-7AM, please contact night-coverage at [email or web address], password [facility] 12/27/2014, 6:55 AM  LOS: 23 days

## 2014-12-28 DIAGNOSIS — J9690 Respiratory failure, unspecified, unspecified whether with hypoxia or hypercapnia: Secondary | ICD-10-CM | POA: Insufficient documentation

## 2014-12-28 LAB — BASIC METABOLIC PANEL
ANION GAP: 14 (ref 5–15)
BUN: 81 mg/dL — AB (ref 6–20)
CALCIUM: 9.5 mg/dL (ref 8.9–10.3)
CO2: 38 mmol/L — AB (ref 22–32)
CREATININE: 1.91 mg/dL — AB (ref 0.44–1.00)
Chloride: 87 mmol/L — ABNORMAL LOW (ref 101–111)
GFR calc Af Amer: 27 mL/min — ABNORMAL LOW (ref >60)
GFR, EST NON AFRICAN AMERICAN: 24 mL/min — AB (ref >60)
GLUCOSE: 105 mg/dL — AB (ref 65–99)
Potassium: 3.2 mmol/L — ABNORMAL LOW (ref 3.5–5.1)
Sodium: 139 mmol/L (ref 135–145)

## 2014-12-28 LAB — MAGNESIUM: Magnesium: 2.3 mg/dL (ref 1.7–2.4)

## 2014-12-28 LAB — PROTIME-INR
INR: 2.04 — AB (ref 0.00–1.49)
PROTHROMBIN TIME: 22.9 s — AB (ref 11.6–15.2)

## 2014-12-28 NOTE — Progress Notes (Signed)
Pt sitting in bed at this time, RT will place on BiPAP when pt is back in bed and ready to wear for the night

## 2014-12-28 NOTE — Progress Notes (Addendum)
Name: [name] [name] MRN: [id number] DOB: 08/09/1931    ADMISSION DATE:  12/04/2014 CONSULTATION DATE:  12/28/2014  REFERRING MD :  Gwenlyn Perking  CHIEF COMPLAINT:  Dyspnea  BRIEF PATIENT DESCRIPTION: 81 y.o. F with end stage ILD (followed by Dr. Kendrick Fries) who was recently admitted by [facility] for ILD flare and parainfluenza.  She was discharged to CIR but then re-admitted by [facility] for urosepsis, AFRVR and AoC dCHF.  On 8/2, she had drop in O2 sats and increased WOB so PCCM consulted for further recs.  SIGNIFICANT EVENTS  Echo 7/7 >>>  EF 55-60%, PAP 55. 7/6/-7/14  Admitted with ILD flare and parainfluenza, d/c'd to CIR for rehab 7/24  Re-admitted with urosepsis, AFRVR and AoC dCHF. 8416"   Pt reports feeling much better.  Denies SOB/fevers, chills.  Notes decrease in swelling.  I/O's appear to be even for last 24 hours but net neg ~5L this admit (?reliability)    SUBJECTIVE/OVERNIGHT/INTERVAL HX 12/28/2014 : asked by [facility] to re-sign on due to refractory hypoxemia despite 6L o2 - call from dr Sunnie Nielsen who reports that cardiac status is as optimized as it can be and RN/MD all struggling to provide for care and oxygenation. D/w Bedside RN - reports when still at 6L pulse ox 88% to 92% but desats to 70s even with minimal movement. Per Patient: feels condition unimproved and she has spent a long time in hospital - most of July 2016 and now august.Says not getting better.   She once told me few months ago - in office and in hospital - tht she prefers to be at home and live with home health services and avoid hospital. She has to provide for a child. She views hospice as death sentence and has always refused to meet with inpatient palliative care or engage in hospice discussion   VITAL SIGNS: Temp:  [97.3 F (36.3 C)-97.8 F (36.6 C)] 97.8 F (36.6 C) (08/17 0752) Pulse Rate:  [65-87] 87 (08/17 0935) Resp:  [17-31] 17 (08/17 0752) BP: (90-142)/(57-82) 117/57 mmHg (08/17 0935) SpO2:  [86 %-98 %] 86 % (08/17  0750) FiO2 (%):  [50 %] 50 % (08/17 0259) Weight:  [123.106 kg (271 lb 6.4 oz)] 123.106 kg (271 lb 6.4 oz) (08/17 0752)  PHYSICAL EXAMINATION: General: Obese female, resting in bed, in NAD. Neuro: A&O x 3, non-focal.  HEENT: [place]/AT. PERRL, sclerae anicteric. BiPAP in place. Cardiovascular: RRR, no M/R/G.  Lungs: Respirations shallow and unlabored.  BS distant with faint bibasilar crackles and occasional wheeze. No accessory muscle use; however, mild paradoxical respirations. Abdomen: Obese, BS x 4, soft, NT/ND.  Musculoskeletal: No gross deformities, 2+ peripheral pitting edema.  Skin: Intact, warm, no rashes.    Recent Labs Lab 12/26/14 0242 12/27/14 0535 12/28/14 0431  NA 141 141 139  K 3.2* 2.8* 3.2*  CL 87* 86* 87*  CO2 40* 44* 38*  BUN 78* 78* 81*  CREATININE 1.81* 1.81* 1.91*  GLUCOSE 93 103* 105*    Recent Labs Lab 12/22/14 0348  HGB 12.3  HCT 38.1  WBC 7.0  PLT 195   No results found.  ASSESSMENT / PLAN:  Acute on chronic hypoxemic respiratory failure.  End stage ILD / UIP.  OSA - uses nocturnal BiPAP as outpatient (she states was previously on CPAP but has since been switched).  PAH (PAP 55 by echo from 11/17/14).   - all causing refractory hypoxemia   - prognosis is in order of few to several months   Plan: Old  recommendation - see below - this is to continue without change  Nocturnal bipap only  Continue supplemental O2 as needed to maintain SpO2 - but reduce goal pulse ox to > 75% - and go with comfort  Continue steroids (don't think any role in increasing steroid dose as her worsening respiratory status is likely due to pulmonary edema / CHF vs sudden ILD flare), budesonide, levalbuterol.  Diuresis as renal function / BP permit  Incentive spirometry.   New recommendation  - consider opioids for dyspnea   - this is end-stage so entertained hospice discussion as follows    - Discussed medicare hospice benefit     - a medicare paid benefit      - for people with terminal qualifying  illness such as IPF, COPD, cancer with statistical prognosis < 6 months for which there is no cure    - utilization of hospice shows people live longer paradoxically than those without hospice due to improved attention     - explained hospice locations - home, residential etc.,      - explained respite care options for caregivers     - explained that she could still get treatment for non-hospice diagnosis and still come to office to see me for hospice related diagnosis that i provide support for     - explained that hospice provides nursing, MD, chaplain, volunteer and medications and supplies paid through medicare    - explained that hospice can meet her goal of being at home rather than hospital      - Surprisingly today 12/28/2014 when I had this conversation she listened and agreed to meet with inpatient palliative care      > 50% of this > 25 min visit spent in face to face counseling or coordination of care    Dr. Kalman Shan, M.D., [facility] Pulmonary and Critical Care Medicine Staff Physician [facility] [place] [facility] Pager: [phone], If no answer or between  15:00h - 7:00h: call [phone]  12/28/2014 11:12 AM

## 2014-12-28 NOTE — Progress Notes (Signed)
[facility] PROGRESS NOTE  [name] [name] ZOX:[id number] DOB: 08/04/1931 DOA: 12/04/2014 PCP: Kerby Nora, MD  Brief Summary  81 y.o. female with a past medical history of pulmonary fibrosis/interstitial pneumonitis, diastolic congestive heart failure, chronic hypoxemic respiratory failure, atrial fibrillation on chronic anticoagulation who was recently admitted to the pulmonary critical care medicine service on 11/16/2014 discharged on 11/24/2014 to acute rehabilitation. During the hospitalization she initially presented with acute on chronic respiratory failure with hypoxemia which was believed to be secondary to acute flare up of end-stage interstitial lung disease. She was treated with IV steroids and broad-spectrum IV antimicrobial therapy. He was found to have a parainfluenza viral infection. She showed slow clinical improvement however becoming debilitated from prolonged hospitalization. She was discharged to inpatient rehabilitation on 11/24/2014. Readmitted on 12/04/14 due to sepsis from UTI and A. Fib with RVR. Course complicated with acute on chronic diastolic heart failure and pulmonary edema. Patient Being diuresed currently. She finished treatment for UTI, sepsis.  Unfortunately, she still remains SOB with minimal exertion, intermittently requires NRB.  She is not interested in palliative care, LTACH, SNF, and states she will go home with home health services.   Assessment/Plan  Acute on chronic hypoxic respiratory failure secondary to acute on chronic diastolic congestive heart failure superimposed on underlying ILD.  Per cardiologist, she is probably close to her "dry weight" despite having impressive lower extremity edema since her BUN and creatinine have been rising.   - Transthoracic echocardiogram performed on 11/17/2014 showed preserved ejection fraction. - Most likely due to A. fib - Weights have stabilized around 280-lbs, creatinine stable around 1.8 and BUN remains  elevated -  Transitioned to PO lasix late on 8/15 and weight is so far unchanged -  Appreciate cardiology and pulmonology assistance. -Weight today at 123 from 127. Would continue with current dose of lasix. Cardiologist think hypoxemia is related to ILD.   Atrial fibrillation with rapid ventricular response triggered by ILD on chronic anticoagulation -  Tele:  Rate controlled a-fib - continue cardizem, metoprolol 25 mg, and Digoxin - CHADVASC score 5 - INR at goal. -  Appreciate Pharmacy assistance  -  Appreciate EP assistance   End-stage interstitial lung disease followed by pulmonology on high dose chronic steroids. -  Chest x-ray demonstrated pulmonary edema vs. ILD; no infiltrates  - continue tapering steroids very slowly over many months, currently on prednisone 30mg  and will be discharged on this dose with follow up with pulmonology -will consult CCM due to persistent hypoxemia.   Sepsis due to Proteus mirabilis and klebsiella Pneumoniae urinary tract infection, present at time of admission, with leukocytosis and tachycardia and lactic acid 4.15.  Resolved and completed course of antibiotics in hospital.  WBC normal.    Hypokalemia due to diuresis.  Oral potassium repletion increased to TID  Thrush, improving, continue patient on nystatin and magic mouthwash  CKD, stage III, creatinine down to 1.81.  BUN is rising.  Agree with sacrificing some kidney function in interest of preserving breathing.  Renal function stable.   Dysuria -  UA neg and culture pending -  Fluconazole x 1  Diet:  Healthy heart  Access:  Peripheral IV IVF:  Off Proph:  Warfarin  Code Status: Full code.  Family Communication: Patient alone Disposition Plan:  Will consult CCM.     Consultation:  Cardiology   PCCM  EP  Antibiotics:  Vancomycin 7/24>>7/27  Elita Quick 7/24>>7/27  Rocephin 7/27>>7/32  Ceftin 7/31>>8/3  HPI/Subjective: She denies worsening dyspnea. Her  oxygen drop to 80  while eating, she was on 6 L. I increased it to 8. L oxygen increase back to 90.   Objective: Filed Vitals:   12/28/14 0424 12/28/14 0750 12/28/14 0752 12/28/14 0935  BP: 90/73  128/77 117/57  Pulse: 70  73 87  Temp: 97.6 F (36.4 C)  97.8 F (36.6 C)   TempSrc: Axillary  Oral   Resp: 22  17   Height:      Weight:   123.106 kg (271 lb 6.4 oz)   SpO2: 92% 86%      Intake/Output Summary (Last 24 hours) at 12/28/14 0936 Last data filed at 12/28/14 0900  Gross per 24 hour  Intake   1113 ml  Output   2400 ml  Net  -1287 ml   Filed Weights   12/26/14 0447 12/27/14 0804 12/28/14 0752  Weight: 129.4 kg (285 lb 4.4 oz) 127.1 kg (280 lb 3.3 oz) 123.106 kg (271 lb 6.4 oz)   Body mass index is 46.56 kg/(m^2).  Exam:   General:  Obese female, NAD, breathlessness when talking, nasal cannula in place.   Cardiovascular:  IRRR, nl S1, S2 no mrg,   Respiratory:  Decreases breath sounds. no increased WOB  Abdomen:   NABS, soft, NT/ND  MSK:   Normal tone and bulk, 2+ pitting bilateral LEE  Data Reviewed: Basic Metabolic Panel:  Recent Labs Lab 12/24/14 0313 12/25/14 0355 12/26/14 0242 12/27/14 0535 12/28/14 0431  NA 139 140 141 141 139  K 4.1 2.9* 3.2* 2.8* 3.2*  CL 89* 88* 87* 86* 87*  CO2 39* 38* 40* 44* 38*  GLUCOSE 110* 104* 93 103* 105*  BUN 69* 76* 78* 78* 81*  CREATININE 1.93* 1.81* 1.81* 1.81* 1.91*  CALCIUM 9.5 9.4 9.6 9.5 9.5  MG  --   --   --   --  2.3   Liver Function Tests: No results for input(s): AST, ALT, ALKPHOS, BILITOT, PROT, ALBUMIN in the last 168 hours. No results for input(s): LIPASE, AMYLASE in the last 168 hours. No results for input(s): AMMONIA in the last 168 hours. CBC:  Recent Labs Lab 12/22/14 0348  WBC 7.0  HGB 12.3  HCT 38.1  MCV 103.5*  PLT 195    Recent Results (from the past 240 hour(s))  Urine culture     Status: None   Collection Time: 12/26/14 12:25 AM  Result Value Ref Range Status   Specimen Description URINE,  RANDOM  Final   Special Requests NONE  Final   Culture MULTIPLE SPECIES PRESENT, SUGGEST RECOLLECTION  Final   Report Status 12/27/2014 FINAL  Final     Studies: No results found.  Scheduled Meds: . allopurinol  300 mg Oral Daily  . antiseptic oral rinse  7 mL Mouth Rinse q12n4p  . atorvastatin  10 mg Oral Daily  . budesonide  0.5 mg Nebulization BID  . chlorhexidine gluconate  15 mL Mouth Rinse BID  . digoxin  0.125 mg Oral QODAY  . diltiazem  300 mg Oral Daily  . escitalopram  20 mg Oral Daily  . furosemide  80 mg Oral BID  . gabapentin  300 mg Oral Daily  . magnesium chloride  1 tablet Oral BID  . metolazone  5 mg Oral Daily  . metoprolol tartrate  50 mg Oral BID  . nystatin  5 mL Oral QID  . potassium chloride  40 mEq Oral TID  . predniSONE  30 mg Oral Q breakfast  .  sodium chloride  3 mL Intravenous Q12H  . sodium chloride  3 mL Intravenous Q12H  . warfarin  2.5 mg Oral q1800  . Warfarin - Pharmacist Dosing Inpatient   Does not apply q1800   Continuous Infusions:   Principal Problem:   Sepsis Active Problems:   CKD (chronic kidney disease) stage 3, GFR 30-59 ml/min   Interstitial lung disease   Debilitated   UTI (lower urinary tract infection)   Atrial fibrillation with RVR   FTT (failure to thrive) in adult   Blood poisoning   Weakness   Acute on chronic respiratory failure with hypoxia   Acute diastolic CHF (congestive heart failure)   Persistent atrial fibrillation    Time spent: 30 min    Regalado, Belkys A  [facility] Pager [phone]. If 7PM-7AM, please contact night-coverage at [email or web address], password [facility] 12/28/2014, 9:36 AM  LOS: 24 days

## 2014-12-28 NOTE — Progress Notes (Signed)
ANTICOAGULATION CONSULT NOTE - Follow Up Consult  Pharmacy Consult for Coumadin Indication: Atrial fibrillation, hx VTE   Allergies  Allergen Reactions  . Pirfenidone Other (See Comments)    Congestion, breathing diffulty   Patient Measurements: Height:  (162.6 cm) Weight: 271 lb 6.4 oz (123.106 kg) IBW/kg (Calculated) : 54.7  Vital Signs: Temp: 97.8 F (36.6 C) (08/17 0752) Temp Source: Oral (08/17 0752) BP: 117/57 mmHg (08/17 0935) Pulse Rate: 87 (08/17 0935)  Labs:  Recent Labs  12/26/14 0242 12/27/14 0535 12/28/14 0431  LABPROT 23.4* 23.1* 22.9*  INR 2.10* 2.06* 2.04*  CREATININE 1.81* 1.81* 1.91*    Estimated Creatinine Clearance: 29.9 mL/min (by C-G formula based on Cr of 1.91).  Assessment: 81 yo female with AFib, on Coumadin pta with reduced INR goal per Cardiology. INR is within this patient's modified goal range, no s/sx bleeding.  Potassium remains low. Supplement was increased.  Her magnesium is within normal limits.  She is also on digoxin and is at risk of toxicity due to renal insufficiency.  Her last level was at the upper end of the therapeutic range on 8/7.  Her dose has been decreased twice since that time.  She will need to be at steady state before reassessing her digoxin level.  PTA dose: 2.5 mg po daily   Goal of Therapy:  INR 1.8-2.2 Monitor platelets by anticoagulation protocol: Yes   Plan:  Continue Coumadin 2.5mg  daily Daily PT/INR Check digoxin level around 8/23  Estella Husk, Pharm.D., BCPS, AAHIVP Clinical Pharmacist Phone: [phone] or [phone] 12/28/2014, 10:05 AM

## 2014-12-28 NOTE — Progress Notes (Signed)
Physical Therapy Treatment Patient Details Name: [name] [name] MRN: [id number] DOB: 08/10/1931 Today's Date: 12/28/2014    History of Present Illness Patient is an 81 year old female with a past medical history of end-stage pulmonary fibrosis, chronic diastolic congestive heart failure, chronic hypoxemic respiratory failure, atrial fibrillation who was admitted to the medicine service on 12/04/2014 when she presented with complaints of a generalized weakness and functional decline. She had a recent hospitalization for acute on chronic hypoxemic respiratory failure believed to be secondary to acute flareup of end-stage interstitial lung disease and viral infection. She was discharged to inpatient rehabilitation. She was discharged from inpatient rehabilitation on 11/24/2014 and had been doing poorly at home. She was found to be in atrial fibrillation rapid ventricular response, encephalopathic, having sepsis.  Dx wth UTI.     PT Comments    Patient tolerated short distance ambulation in room on NRB.  She participates well as she can given respiratory limitations.  Will continue skilled PT in acute setting.  Follow Up Recommendations  Other (comment);Supervision/Assistance - 24 hour (Patient considering Hospice at home, likely won't be candidate for PT)     Equipment Recommendations  None recommended by PT    Recommendations for Other Services       Precautions / Restrictions Precautions Precautions: Fall Precaution Comments: monitor O2 sats and HR    Mobility  Bed Mobility               General bed mobility comments: patient up in chair  Transfers Overall transfer level: Needs assistance Equipment used: Rolling walker (2 wheeled) Transfers: Sit to/from Stand Sit to Stand: Min assist         General transfer comment: slightly increased assist standing from chair without armrests  Ambulation/Gait Ambulation/Gait assistance: Min guard;Supervision Ambulation Distance  (Feet): 13 Feet (x 2) Assistive device: Rolling walker (2 wheeled) Gait Pattern/deviations: Step-through pattern;Trunk flexed;Decreased stride length     General Gait Details: patient walked to chair near door and sat to recover on 100% NRB, then returned to recliner, maintained sats >90% throughout.  After back on 6L nasal cannula dropped to 79%, back up to 84% prior to my exit RT reports this is her baseline   Information systems manager [place] (Stroke Patients Only)       Balance Overall balance assessment: Needs assistance   Sitting balance-Leahy Scale: Good       Standing balance-Leahy Scale: Poor Standing balance comment: UE support for standing                    Cognition Arousal/Alertness: Awake/alert Behavior During Therapy: WFL for tasks assessed/performed Overall Cognitive Status: Within Functional Limits for tasks assessed                      Exercises General Exercises - Upper Extremity Shoulder Flexion: Both;10 reps;Strengthening;Seated (holding bottles of lotion and powder in hands) Elbow Flexion: Strengthening;Both;20 reps;Seated (holding bottles of lotion and powder in hands) General Exercises - Lower Extremity Ankle Circles/Pumps: AROM;Seated;Both;20 reps Long Arc Quad: AROM;Both;Seated Hip Flexion/Marching: AROM;Both;Seated;20 reps Toe Raises: AROM;Seated;Both;15 reps Heel Raises: AROM;Seated;Both;15 reps    General Comments        Pertinent Vitals/Pain Pain Assessment: No/denies pain    Home Living                      Prior Function  PT Goals (current goals can now be found in the care plan section) Progress towards PT goals: Progressing toward goals    Frequency  Min 3X/week    PT Plan Discharge plan needs to be updated    Co-evaluation             End of Session Equipment Utilized During Treatment: Oxygen Activity Tolerance: Patient limited by  fatigue Patient left: in chair;with call bell/phone within reach     Time: 1247-1312 PT Time Calculation (min) (ACUTE ONLY): 25 min  Charges:  $Gait Training: 8-22 mins $Therapeutic Exercise: 8-22 mins                    G Codes:      WYNN,CYNDI 2014/12/30, 2:10 PM  [place], [place] [phone]

## 2014-12-29 ENCOUNTER — Telehealth: Payer: Self-pay

## 2014-12-29 DIAGNOSIS — Z789 Other specified health status: Secondary | ICD-10-CM

## 2014-12-29 DIAGNOSIS — Z515 Encounter for palliative care: Secondary | ICD-10-CM

## 2014-12-29 DIAGNOSIS — Z7189 Other specified counseling: Secondary | ICD-10-CM

## 2014-12-29 DIAGNOSIS — A4151 Sepsis due to Escherichia coli [E. coli]: Secondary | ICD-10-CM

## 2014-12-29 LAB — BASIC METABOLIC PANEL
ANION GAP: 13 (ref 5–15)
BUN: 78 mg/dL — ABNORMAL HIGH (ref 6–20)
CALCIUM: 9.8 mg/dL (ref 8.9–10.3)
CO2: 41 mmol/L — ABNORMAL HIGH (ref 22–32)
CREATININE: 1.7 mg/dL — AB (ref 0.44–1.00)
Chloride: 90 mmol/L — ABNORMAL LOW (ref 101–111)
GFR, EST AFRICAN AMERICAN: 31 mL/min — AB (ref >60)
GFR, EST NON AFRICAN AMERICAN: 27 mL/min — AB (ref >60)
Glucose, Bld: 99 mg/dL (ref 65–99)
Potassium: 3.4 mmol/L — ABNORMAL LOW (ref 3.5–5.1)
SODIUM: 144 mmol/L (ref 135–145)

## 2014-12-29 LAB — PROTIME-INR
INR: 2.21 — ABNORMAL HIGH (ref 0.00–1.49)
PROTHROMBIN TIME: 24.3 s — AB (ref 11.6–15.2)

## 2014-12-29 MED ORDER — FUROSEMIDE 80 MG PO TABS: 80.0000 mg | ORAL_TABLET | Freq: Two times a day (BID) | ORAL | Status: AC

## 2014-12-29 MED ORDER — FLUCONAZOLE 100 MG PO TABS: 100.0000 mg | ORAL_TABLET | Freq: Every day | ORAL | Status: AC

## 2014-12-29 MED ORDER — DIGOXIN 125 MCG PO TABS: 0.1250 mg | ORAL_TABLET | ORAL | Status: AC

## 2014-12-29 MED ORDER — DILTIAZEM HCL ER COATED BEADS 300 MG PO CP24: 300.0000 mg | ORAL_CAPSULE | Freq: Every day | ORAL | Status: AC

## 2014-12-29 MED ORDER — NYSTATIN 100000 UNIT/ML MT SUSP: 5.0000 mL | Freq: Four times a day (QID) | OROMUCOSAL | Status: AC

## 2014-12-29 MED ORDER — METOPROLOL TARTRATE 50 MG PO TABS: 50.0000 mg | ORAL_TABLET | Freq: Two times a day (BID) | ORAL | Status: AC

## 2014-12-29 MED ORDER — LEVALBUTEROL HCL 0.63 MG/3ML IN NEBU: 0.6300 mg | INHALATION_SOLUTION | RESPIRATORY_TRACT | Status: AC | PRN

## 2014-12-29 MED ORDER — MAGIC MOUTHWASH W/LIDOCAINE: 5.0000 mL | Freq: Three times a day (TID) | ORAL | Status: AC | PRN

## 2014-12-29 MED ORDER — PREDNISONE 10 MG PO TABS: 30.0000 mg | ORAL_TABLET | Freq: Every day | ORAL | Status: AC

## 2014-12-29 MED ORDER — MAGNESIUM CHLORIDE 64 MG PO TBEC: 1.0000 | DELAYED_RELEASE_TABLET | Freq: Two times a day (BID) | ORAL | Status: AC

## 2014-12-29 MED ORDER — FLUCONAZOLE 100 MG PO TABS
100.0000 mg | ORAL_TABLET | Freq: Every day | ORAL | Status: DC
  Filled 2014-12-29: qty 1

## 2014-12-29 MED ORDER — OXYCODONE HCL 5 MG PO TABS: 5.0000 mg | ORAL_TABLET | ORAL | Status: AC | PRN

## 2014-12-29 NOTE — Telephone Encounter (Signed)
i only just received right now, and signed accordingly.

## 2014-12-29 NOTE — Care Management Note (Signed)
Case Management Note  Patient Details  Name: KAIAH HOSEA MRN: [id number] Date of Birth: 1931/12/09  Subjective/Objective:       Pt will d/c home with home hospice care.  Provided list of hospice agencies in [facility] and referral made to [facility] and [facility] per choice.  Documentation faxed to agency.  Pt states she has a hospital bed and all other equipment she will need.                                      Expected Discharge Plan:  Home w Hospice Care  Discharge planning Services  CM Consult  Post Acute Care Choice:  Hospice Choice offered to:  Patient  HH Arranged:  RN [place] Agency:  [facility]  Status of Service:  Completed, signed off  Medicare Important Message Given:  12/29/14  [name], RN 12/29/2014, 1:43 PM

## 2014-12-29 NOTE — Discharge Summary (Signed)
Physician Discharge Summary  [name] [name] WJX:[id number] DOB: 1932/02/13 DOA: 12/04/2014  PCP: Kerby Nora, MD  Admit date: 12/04/2014 Discharge date: 12/29/2014  Time spent: 35 minutes  Recommendations for Outpatient Follow-up:  1. Discharge home with Hospice,  2.  Needs INR check.   Discharge Diagnoses:    Acute on chronic respiratory failure with hypoxia   Acute diastolic CHF (congestive heart failure)   Sepsis   CKD (chronic kidney disease) stage 3, GFR 30-59 ml/min   Interstitial lung disease   Debilitated   UTI (lower urinary tract infection)   Atrial fibrillation with RVR   FTT (failure to thrive) in adult   Blood poisoning   Weakness   Persistent atrial fibrillation   Respiratory failure   Discharge Condition: Discharge home with hospice, poor prognosis.   Diet recommendation: heart Healthy diet   Filed Weights   12/27/14 0804 12/28/14 0752 12/29/14 0500  Weight: 127.1 kg (280 lb 3.3 oz) 123.106 kg (271 lb 6.4 oz) 127.007 kg (280 lb)    History of present illness:  HPI: [name] [name] is a 81 y.o. female with a past medical history of pulmonary fibrosis/interstitial pneumonitis, diastolic congestive heart failure, chronic hypoxemic respiratory failure, atrial fibrillation on chronic anticoagulation who was recently admitted to the pulmonary critical care medicine service on 11/16/2014 discharged on 11/24/2014 to acute rehabilitation. During the hospitalization she initially presented with acute on chronic respiratory failure with hypoxemia which was believed to be secondary to acute flare up of end-stage interstitial lung disease. She was treated with IV steroids and broad-spectrum IV antimicrobial therapy. He was found to have a parainfluenza viral infection. She showed slow clinical improvement however becoming debilitated from prolonged hospitalization. She was discharged to inpatient rehabilitation on 11/24/2014. He was discharged home from acute rehabilitation on  12/01/2014. She presented to the emergency department today with complaints of generalized weakness, malaise, excessive daytime sleepiness, fatigue, having an overall functional decline unable to perform her usual activities of daily living. She was found to be in A. fib with RVR and started on IV Cardizem. Patient meeting sepsis criteria having a white count of 27,500, lactate of 4.15, heart rates in the 140s to 150s, respiratory rate of 27. She was started on broad-spectrum IV microbial therapy with Zosyn and vancomycin.  Hospital Course:  81 y.o. female with a past medical history of pulmonary fibrosis/interstitial pneumonitis, diastolic congestive heart failure, chronic hypoxemic respiratory failure, atrial fibrillation on chronic anticoagulation who was recently admitted to the pulmonary critical care medicine service on 11/16/2014 discharged on 11/24/2014 to acute rehabilitation. During the hospitalization she initially presented with acute on chronic respiratory failure with hypoxemia which was believed to be secondary to acute flare up of end-stage interstitial lung disease. She was treated with IV steroids and broad-spectrum IV antimicrobial therapy. He was found to have a parainfluenza viral infection. She showed slow clinical improvement however becoming debilitated from prolonged hospitalization. She was discharged to inpatient rehabilitation on 11/24/2014. Readmitted on 12/04/14 due to sepsis from UTI and A. Fib with RVR. Course complicated with acute on chronic diastolic heart failure and pulmonary edema. Patient Being diuresed currently. She finished treatment for UTI, sepsis. Unfortunately, she still remains SOB with minimal exertion, intermittently requires NRB. She is not interested in palliative care, LTACH, SNF, and states she will go home with home health services.   Assessment/Plan  Acute on chronic hypoxic respiratory failure secondary to acute on chronic diastolic congestive  heart failure superimposed on underlying ILD. Per cardiologist, she  is probably close to her "dry weight" despite having impressive lower extremity edema since her BUN and creatinine have been rising.  - Transthoracic echocardiogram performed on 11/17/2014 showed preserved ejection fraction. - Most likely due to A. fib - Weights have stabilized around 280-lbs, creatinine stable around 1.8 and BUN remains elevated - Transitioned to PO lasix late on 8/15.  - Appreciate cardiology and pulmonology assistance. -Weight today at 127, has been stable. . Would continue with current dose of lasix. Cardiologist think hypoxemia is related to ILD.   Atrial fibrillation with rapid ventricular response triggered by ILD on chronic anticoagulation - Tele: Rate controlled a-fib - continue cardizem, metoprolol 25 mg, and Digoxin - CHADVASC score 5 - INR at goal. - Appreciate Pharmacy assistance  - Appreciate EP assistance   End-stage interstitial lung disease followed by pulmonology on high dose chronic steroids. - Chest x-ray demonstrated pulmonary edema vs. ILD; no infiltrates  - continue tapering steroids very slowly over many months, currently on prednisone 30mg  and will be discharged on this dose with follow up with pulmonology -consult CCM due to persistent hypoxemia. Dr Marchelle Gearing recommend palliative care consult. Set new goal for hypoxemia, keep oxygen above 85. Continue with prednisone, lasix.  Patient will be discharge home with hospice.   Sepsis due to Proteus mirabilis and klebsiella Pneumoniae urinary tract infection, present at time of admission, with leukocytosis and tachycardia and lactic acid 4.15. Resolved and completed course of antibiotics in hospital. WBC normal.  She has remain afebrile.   Hypokalemia due to diuresis. Oral potassium repletion BID.   Thrush, improving, continue patient on nystatin and magic mouthwash. Will provide oral fluconazole.   CKD, stage III,  creatinine down to 1.6  Agree with sacrificing some kidney function in interest of preserving breathing. Renal function stable.   She is feeling tired today, breathing ok. No new complaints.  Procedures:  none  Consultations:  CCM  Cardiology  Palliative care team.   Discharge Exam: Filed Vitals:   12/29/14 1243  BP: 109/57  Pulse: 66  Temp: 97.3 F (36.3 C)  Resp: 24    General: Alert no acute distress.  Cardiovascular: S 1, S 2 IRR Respiratory: no wheezing , bilateral air movement.   Discharge Instructions   Discharge Instructions    Diet - low sodium heart healthy    Complete by:  As directed           Current Discharge Medication List    START taking these medications   Details  !! Alum & Mag Hydroxide-Simeth (MAGIC MOUTHWASH W/LIDOCAINE) SOLN Take 5 mLs by mouth 3 (three) times daily as needed for mouth pain. Qty: 100 mL, Refills: 0    digoxin (LANOXIN) 0.125 MG tablet Take 1 tablet (0.125 mg total) by mouth every other day. Qty: 30 tablet, Refills: 0    levalbuterol (XOPENEX) 0.63 MG/3ML nebulizer solution Take 3 mLs (0.63 mg total) by nebulization every 2 (two) hours as needed for wheezing or shortness of breath. Qty: 3 mL, Refills: 12    magnesium chloride (SLOW-MAG) 64 MG TBEC SR tablet Take 1 tablet (64 mg total) by mouth 2 (two) times daily. Qty: 30 tablet, Refills: 0    metoprolol (LOPRESSOR) 50 MG tablet Take 1 tablet (50 mg total) by mouth 2 (two) times daily. Qty: 60 tablet, Refills: 0    nystatin (MYCOSTATIN) 100000 UNIT/ML suspension Take 5 mLs (500,000 Units total) by mouth 4 (four) times daily. Qty: 60 mL, Refills: 0    oxyCODONE (OXY  IR/ROXICODONE) 5 MG immediate release tablet Take 1 tablet (5 mg total) by mouth every 4 (four) hours as needed for moderate pain. Qty: 30 tablet, Refills: 0     !! - Potential duplicate medications found. Please discuss with provider.    CONTINUE these medications which have CHANGED   Details   diltiazem (CARDIZEM CD) 300 MG 24 hr capsule Take 1 capsule (300 mg total) by mouth daily. Qty: 30 capsule, Refills: 0    furosemide (LASIX) 80 MG tablet Take 1 tablet (80 mg total) by mouth 2 (two) times daily. Qty: 30 tablet, Refills: 0    predniSONE (DELTASONE) 10 MG tablet Take 3 tablets (30 mg total) by mouth daily with breakfast. Qty: 90 tablet, Refills: 2      CONTINUE these medications which have NOT CHANGED   Details  albuterol (PROVENTIL) (2.5 MG/3ML) 0.083% nebulizer solution Take 3 mLs (2.5 mg total) by nebulization every 2 (two) hours as needed for wheezing or shortness of breath. Qty: 75 mL, Refills: 12    allopurinol (ZYLOPRIM) 300 MG tablet Take 1 tablet (300 mg total) by mouth daily. Qty: 30 tablet, Refills: 11    !! Alum & Mag Hydroxide-Simeth (MAGIC MOUTHWASH W/LIDOCAINE) SOLN Take 15 mLs by mouth 3 (three) times daily as needed for mouth pain. Qty: 15 mL, Refills: 0    aspirin 81 MG tablet Take 81 mg by mouth daily.    budesonide (PULMICORT) 0.25 MG/2ML nebulizer solution Take 0.25 mg by nebulization 2 (two) times daily.    cetirizine (ZYRTEC ALLERGY) 10 MG tablet Take 10 mg by mouth daily.     escitalopram (LEXAPRO) 20 MG tablet Take 1 tablet (20 mg total) by mouth daily. Qty: 30 tablet, Refills: 5    famotidine (PEPCID) 20 MG tablet Take 1 tablet (20 mg total) by mouth daily. Qty: 30 tablet, Refills: 6    ferrous sulfate 325 (65 FE) MG tablet Take 1 tablet (325 mg total) by mouth 2 (two) times daily with a meal. Qty: 60 tablet, Refills: 0    gabapentin (NEURONTIN) 300 MG capsule Take 1 capsule (300 mg total) by mouth daily. Qty: 90 capsule, Refills: 0    metolazone (ZAROXOLYN) 5 MG tablet Take 1 tablet (5 mg total) by mouth daily. Qty: 30 tablet, Refills: 1    Multiple Vitamins-Minerals (MULTIVITAMIN & MINERAL PO) Take 1 tablet by mouth daily.    potassium chloride SA (K-DUR,KLOR-CON) 20 MEQ tablet Take 2 tablets (40 mEq total) by mouth 2 (two)  times daily.    warfarin (COUMADIN) 2.5 MG tablet Take 1 tablet (2.5 mg total) by mouth daily at 6 PM. Qty: 30 tablet, Refills: 0    atorvastatin (LIPITOR) 10 MG tablet Take 1 tablet (10 mg total) by mouth daily. Qty: 90 tablet, Refills: 1     !! - Potential duplicate medications found. Please discuss with provider.     Allergies  Allergen Reactions  . Pirfenidone Other (See Comments)    Congestion, breathing diffulty      The results of significant diagnostics from this hospitalization (including imaging, microbiology, ancillary and laboratory) are listed below for reference.    Significant Diagnostic Studies: Dg Chest 2 View  12/05/2014   CLINICAL DATA:  Sepsis, CHF, interstitial lung disease, chronic renal insufficiency, failure to thrive.  EXAM: CHEST  2 VIEW  COMPARISON:  Portable chest x-ray of December 04, 2014  FINDINGS: The lungs are borderline hypoinflated especially on the right. This is stable. The interstitial markings remain coarse. There  is pleural fluid versus pleural thickening in the apices and along the right lateral thoracic wall which is also stable. The lateral aspect of the left hemidiaphragm remains obscured. The cardiac silhouette is enlarged. The pulmonary vascularity is engorged. The bony thorax exhibits no acute abnormality. There is moderate degenerative disc disease of the thoracic spine.  IMPRESSION: CHF with mild pulmonary interstitial edema. Biapical pleural fluid or pleural thickening. Left lower lobe atelectasis or pneumonia laterally, stable.   Electronically Signed   By: David  [place] M.D.   On: 12/05/2014 07:50   Dg Chest Port 1 View  12/25/2014   CLINICAL DATA:  82 year old female with history of hypoxia.  EXAM: PORTABLE CHEST - 1 VIEW  COMPARISON:  Chest x-ray 12/14/2014.  FINDINGS: Lung volumes are low. Atelectasis and/or consolidation of the left lung base. Small left pleural effusion. Diffusely coarsened interstitial markings appear to be chronic  compared to numerous prior examinations. Cephalization of the pulmonary vasculature. Mild cardiomegaly. The patient is rotated to the left on today's exam, resulting in distortion of the mediastinal contours and reduced diagnostic sensitivity and specificity for mediastinal pathology. Atherosclerosis in the thoracic aorta.  IMPRESSION: 1. Study is limited by are low lung volumes and patient positioning. There appears to be atelectasis and/or consolidation in the left lower lobe with a small left pleural effusion. 2. Cardiomegaly with cephalization of the pulmonary vasculature. There is indistinctness of the interstitial markings which may suggest mild congestive heart failure, however, the coarsened interstitial markings may alternatively be chronic. 3. Atherosclerosis.   Electronically Signed   By: Trudie Reed M.D.   On: 12/25/2014 11:03   Dg Chest Port 1 View  12/14/2014   CLINICAL DATA:  Respiratory failure.  EXAM: PORTABLE CHEST - 1 VIEW  COMPARISON:  12/13/2014.  FINDINGS: Cardiomegaly with pulmonary interstitial prominence and left pleural effusion again noted. Findings consistent with persistent congestive heart failure. Left base atelectasis and/or infiltrate. Bilateral pleural thickening. This is most consistent scarring. No acute bony abnormality.  IMPRESSION: 1. Persistent changes of persist congestive heart failure with pulmonary interstitial edema and left pleural effusion.  2. Left base atelectasis and/or infiltrate.   Electronically Signed   By: Maisie Fus  Register   On: 12/14/2014 06:59   Dg Chest Port 1 View  12/13/2014   CLINICAL DATA:  Shortness of Breath  EXAM: PORTABLE CHEST - 1 VIEW  COMPARISON:  December 05, 2014  FINDINGS: There is stable cardiomegaly with mild interstitial edema. No airspace consolidation. There is a questionable small left effusion. No adenopathy. There is atherosclerotic change in the aorta. No bone lesions.  IMPRESSION: There is persistent congestive heart failure. No  frank airspace consolidation. No new opacity appreciable.   Electronically Signed   By: Bretta Bang III M.D.   On: 12/13/2014 17:01   Dg Chest Portable 1 View  12/04/2014   CLINICAL DATA:  Weakness, recent hospitalization  EXAM: PORTABLE CHEST - 1 VIEW  COMPARISON:  11/24/2014  FINDINGS: Moderate enlargement of the cardiomediastinal silhouette is noted. Lung volumes are low with marked crowding of the bronchovascular markings but no focal opacity allowing for portable technique. Trace if any pleural fluid. Cardiac leads obscure detail.  IMPRESSION: Low volume exam without focal acute finding. If symptoms persist, consider PA and lateral chest radiographs obtained at full inspiration when the patient is clinically able.   Electronically Signed   By: Christiana Pellant M.D.   On: 12/04/2014 14:27    Microbiology: Recent Results (from the past 240 hour(s))  Urine culture     Status: None   Collection Time: 12/26/14 12:25 AM  Result Value Ref Range Status   Specimen Description URINE, RANDOM  Final   Special Requests NONE  Final   Culture MULTIPLE SPECIES PRESENT, SUGGEST RECOLLECTION  Final   Report Status 12/27/2014 FINAL  Final     Labs: Basic Metabolic Panel:  Recent Labs Lab 12/25/14 0355 12/26/14 0242 12/27/14 0535 12/28/14 0431 12/29/14 0410  NA 140 141 141 139 144  K 2.9* 3.2* 2.8* 3.2* 3.4*  CL 88* 87* 86* 87* 90*  CO2 38* 40* 44* 38* 41*  GLUCOSE 104* 93 103* 105* 99  BUN 76* 78* 78* 81* 78*  CREATININE 1.81* 1.81* 1.81* 1.91* 1.70*  CALCIUM 9.4 9.6 9.5 9.5 9.8  MG  --   --   --  2.3  --    Liver Function Tests: No results for input(s): AST, ALT, ALKPHOS, BILITOT, PROT, ALBUMIN in the last 168 hours. No results for input(s): LIPASE, AMYLASE in the last 168 hours. No results for input(s): AMMONIA in the last 168 hours. CBC: No results for input(s): WBC, NEUTROABS, HGB, HCT, MCV, PLT in the last 168 hours. Cardiac Enzymes: No results for input(s): CKTOTAL, CKMB,  CKMBINDEX, TROPONINI in the last 168 hours. BNP: BNP (last 3 results)  Recent Labs  10/14/14 0420 11/16/14 1851 12/04/14 1423  BNP 301.7* 160.9* 280.1*    ProBNP (last 3 results)  Recent Labs  02/22/14 0947 02/24/14 0332 03/07/14 0648  PROBNP 2319.0* 2410.0* 437.5    CBG: No results for input(s): GLUCAP in the last 168 hours.     SignedHartley Barefoot A  [facility] 12/29/2014, 2:24 PM

## 2014-12-29 NOTE — Progress Notes (Signed)
Patient is discharged to home via private vehicle.  Son and son's wife, Amy, came to pick up the patient.  Discharge instructions and prescriptions given to Amy per patient's request.   Wheeled to vehicle by NT.  Patient is alert and oriented.

## 2014-12-29 NOTE — Care Management Important Message (Signed)
ImImportant Message  Patient Details  Name: [name] [name] MRN: [id number] Date of Birth: 1931-09-02   Medicare Important Message Given:  Important Message given.    Alixander Rallis, Nazlini T, RN 12/29/2014, 1:42 PM

## 2014-12-29 NOTE — Progress Notes (Signed)
ANTICOAGULATION CONSULT NOTE - Follow Up Consult  Pharmacy Consult for Coumadin Indication: Afib, hx of VTE  Allergies  Allergen Reactions  . Pirfenidone Other (See Comments)    Congestion, breathing diffulty    Patient Measurements: Height:  (162.6 cm) Weight: 280 lb (127.007 kg) (standing weight ) IBW/kg (Calculated) : 54.7  Vital Signs: Temp: 97.9 F (36.6 C) (08/18 0800) Temp Source: Oral (08/18 0800) BP: 103/59 mmHg (08/18 0800) Pulse Rate: 65 (08/18 0800)  Labs:  Recent Labs  12/27/14 0535 12/28/14 0431 12/29/14 0410  LABPROT 23.1* 22.9* 24.3*  INR 2.06* 2.04* 2.21*  CREATININE 1.81* 1.91* 1.70*    Estimated Creatinine Clearance: 34.3 mL/min (by C-G formula based on Cr of 1.7).   Assessment: 81 yo female with AFib, on Coumadin pta with reduced INR goal per Cardiology. Goal is 1.8-2.2. Current INR therapeutic at 2.21, CBC stable from 8/10. No s/sx bleeding.  PTA Coumadin dose: 2.5mg  PO daily  Goal of Therapy:  INR 1.8-2.2 Monitor platelets by anticoagulation protocol: Yes   Plan:  Continue coumadin 2.5mg  PO daily Monitor daily INR, CBC, s/s of bleed  Sasan Wilkie J 12/29/2014,9:58 AM

## 2014-12-29 NOTE — Telephone Encounter (Signed)
[name] [name] with [facility] left v/m; pt is being discharged from  [facility] 12/29/14 and request hospice service in the home today. [name] [name] is aware Dr Ermalene Searing is not in office and has faxed written hospice order and certification of terminal illness to [facility] and request Dr Patsy Lager to sign so can go out to see pt today.Please advise. [name] [name] request cb.

## 2014-12-29 NOTE — Consult Note (Signed)
Consultation Note Date: 12/29/2014   Patient Name: [name] [name]  DOB: [birth year]  MRN: [id number]  Age / Sex: 81 y.o., female   PCP: [name] Sanders, MD Referring Physician: Elmarie Shiley, MD  Reason for Consultation: Disposition and Establishing goals of care  Palliative Care Assessment and Plan Summary of Established Goals of Care and Medical Treatment Preferences   Clinical Assessment/Narrative: [name] [name] is an 81 y.o. female with a past medical history of pulmonary fibrosis/interstitial pneumonitis, diastolic congestive heart failure, chronic hypoxemic respiratory failure, atrial fibrillation on chronic anticoagulation with hospitalization in July of acute on chronic respiratory failure with hypoxemia which was believed to be secondary to acute flare up of end-stage interstitial lung disease and parainfluenza viral infection.She was discharged to inpatient rehabilitation and readmitted to hospital 10 days later due to sepsis from UTI and A. Fib with RVR. She is continued to remain short of breath during this admission due to her underlying lung disease. She has met with palliative medicine in the past but was uninterested in service that time. She is now interested in discussing options to go home with hospice support. Palliative medicine consulted for goals of care regarding this.  I met with [name] [name] this afternoon in order to discuss her clinical course recently as well as her goals of care moving forward. She reports knowing that she is progressing in her disease and does not have much time left, however she jokes that she "just want 5 more years."  She reports that the doctors have been doing the job explaining things to her. She states that she knows that she is approaching the end of her life. She agreed that based upon her current condition, she will benefit from focusing on aggressive symptom management rather than trying to fix problems that are not fixable. She reports that  she would like to go home. She reports when she gets home she'll have 24-hour care in her home through her son and hired care support.  We discussed that with her goal of going home and staying at home, focusing on comfort, and being with family that hospice support would be an ideal tool for her to utilize. Discussed in length the benefits that hospice would provide including visits from hospice nurses who can evaluate her to ensure she has good symptom management.  She reports "that all sounds fine." She reports she lives in the [place] area and would like to go home with hospice there. We also discussed that if her goal is to stay at home, that she would best be served by ensuring that her care moving forward is in line with the goal. We then discussed her CODE STATUS and she was in agreement with transitioning to DNR/DNI. Completed a durable DO NOT RESUSCITATE. I told her that I would place this on the chart, but she asked that I leave it in the room in order for her to review it again. I asked if she any questions regarding the document, and she reported that she did not. She did not have any other questions.  - Patient will like to transition home today with hospice support. - Case manager notified of need to present options for home hospice in [place] area. - Durable DO NOT RESUSCITATE completed with patient. It was left in her room on bedside table at her request. I explained to her that this was documented should go home with her and be placed on her refrigerator.  Contacts/Participants in Discussion: Primary  Decision Maker: Patient   HCPOA: yes  [name] [name], son  Code Status/Advance Care Planning:  Patient agrees with with transitioning to DNR/DNI. Durable DO NOT RESUSCITATE completed today. She is planning on discharging home with hospice this afternoon.  Symptom Management:   Shortness of breath: She denies currently, but she does have increased work of breathing. She does  have low-dose oxycodone ordered. I explained to her the low-dose opioids may be beneficial in helping her feel better symptomatically.    Additional Recommendations (Limitations, Scope, Preferences):  None Psycho-social/Spiritual:   Support System: Family  Desire for further Chaplaincy support:no  Prognosis: < 6 months  Discharge Planning:  Home with Hospice       Chief Complaint/History of Present Illness:   [name] [name] is an 81 y.o. female with a past medical history of pulmonary fibrosis/interstitial pneumonitis, diastolic congestive heart failure, chronic hypoxemic respiratory failure, atrial fibrillation on chronic anticoagulation with hospitalization in July of acute on chronic respiratory failure with hypoxemia which was believed to be secondary to acute flare up of end-stage interstitial lung disease and parainfluenza viral infection.She was discharged to inpatient rehabilitation and readmitted to hospital 10 days later due to sepsis from UTI and A. Fib with RVR. She is continued to remain short of breath during this admission due to her underlying lung disease. She is met with palliative medicine the past but was uninterested in service that time. She recently met with critical care and is interested in discussing options to go home with hospice support. Palliative medicine consulted for goals of care regarding this.  I met with [name] [name] long discussion regarding her current clinical condition, care plan moving forward, options for discharge, and symptom management. Please see details above.  Primary Diagnoses  Present on Admission:  . Sepsis . UTI (lower urinary tract infection) . Atrial fibrillation with RVR . FTT (failure to thrive) in adult . CKD (chronic kidney disease) stage 3, GFR 30-59 ml/min . Debilitated . Interstitial lung disease . Acute diastolic CHF (congestive heart failure)  Palliative Review of Systems: Patient currently denies symptoms I have  reviewed the medical record, interviewed the patient and family, and examined the patient. The following aspects are pertinent.  Past Medical History  Diagnosis Date  . Chronic diastolic CHF (congestive heart failure)     a. ECHO 6/0: EF 55-60%, mild LVH, mildly dilated RV w. mildly dec fx, RVSP 67;  b. 11/2014 Echo: EF 55-60%, mildly dil LA, RV dilated and hypertrophied - ? mass on parasternal images, PASP 2mmHg.  [name] Chronic atrial fibrillation     a. failed cardioversion in past - repeat DCCV on October 5th, 2010;  b. recurrent Afib 11/2014 in setting of sepsis;  c. CHA2DS2VASc = 5-6 (nonobs CAD)-->Coumadin.  . Personal history of DVT (deep vein thrombosis) 2008    LLE  . HTN (hypertension)   . Morbid obesity   . Shingles     with postherpetic neuraigia  . GERD (gastroesophageal reflux disease)   . Hyperlipidemia   . Meningitis ~ 1960    hospitalized @ [facility]  . Gallstones     s/p cholecystectomy  . Non-obstructive CAD     a. cath 8/10 - LAD 40-50% w mild PAH mean 29 w PVR 3.2 Woods units.  . PE (pulmonary embolism) 2008  . Lung interstitial disease   . Hypoxemia   . COPD (chronic obstructive pulmonary disease)   . On home oxygen therapy     "5L; 24/7" (10/11/2014)  .  OSA treated with BiPAP     "I think it's BiPAP"  . AAA (abdominal aortic aneurysm)     small  . History of blood transfusion   . Iron deficiency anemia   . Gout   . Depression   . Chronic kidney disease (CKD), stage II (mild)    Social History   Social History  . Marital Status: Widowed    Spouse Name: N/A  . Number of Children: N/A  . Years of Education: N/A   Social History Main Topics  . Smoking status: Never Smoker   . Smokeless tobacco: Never Used  . Alcohol Use: Yes     Comment: 10/11/2014 "used to have a drink on holidays; don't drink at all now"  . Drug Use: No  . Sexual Activity: No   Other Topics Concern  . None   Social History Narrative   Widowed. Retired from [employer], occupational.    Family History  Problem Relation Age of Onset  . Heart failure Mother   . Depression Mother   . Ovarian cancer Paternal Grandmother   . Heart disease Paternal Grandfather   . Obesity Other    Scheduled Meds: . allopurinol  300 mg Oral Daily  . antiseptic oral rinse  7 mL Mouth Rinse q12n4p  . atorvastatin  10 mg Oral Daily  . budesonide  0.5 mg Nebulization BID  . chlorhexidine gluconate  15 mL Mouth Rinse BID  . digoxin  0.125 mg Oral QODAY  . diltiazem  300 mg Oral Daily  . escitalopram  20 mg Oral Daily  . furosemide  80 mg Oral BID  . gabapentin  300 mg Oral Daily  . magnesium chloride  1 tablet Oral BID  . metolazone  5 mg Oral Daily  . metoprolol tartrate  50 mg Oral BID  . nystatin  5 mL Oral QID  . potassium chloride  40 mEq Oral TID  . predniSONE  30 mg Oral Q breakfast  . sodium chloride  3 mL Intravenous Q12H  . sodium chloride  3 mL Intravenous Q12H  . warfarin  2.5 mg Oral q1800  . Warfarin - Pharmacist Dosing Inpatient   Does not apply q1800   Continuous Infusions:  PRN Meds:.sodium chloride, acetaminophen **OR** acetaminophen, levalbuterol, magic mouthwash w/lidocaine, ondansetron **OR** ondansetron (ZOFRAN) IV, oxyCODONE, sodium chloride Medications Prior to Admission:  Prior to Admission medications   Medication Sig Start Date End Date Taking? Authorizing Provider  albuterol (PROVENTIL) (2.5 MG/3ML) 0.083% nebulizer solution Take 3 mLs (2.5 mg total) by nebulization every 2 (two) hours as needed for wheezing or shortness of breath. 10/20/14  Yes Reyne Dumas, MD  allopurinol (ZYLOPRIM) 300 MG tablet Take 1 tablet (300 mg total) by mouth daily. 12/02/14  Yes Daniel J Angiulli, PA-C  Alum & Mag Hydroxide-Simeth (MAGIC MOUTHWASH W/LIDOCAINE) SOLN Take 15 mLs by mouth 3 (three) times daily as needed for mouth pain. 12/02/14  Yes Daniel J Angiulli, PA-C  aspirin 81 MG tablet Take 81 mg by mouth daily.   Yes Historical Provider, MD  budesonide (PULMICORT) 0.25 MG/2ML  nebulizer solution Take 0.25 mg by nebulization 2 (two) times daily.   Yes Historical Provider, MD  cetirizine (ZYRTEC ALLERGY) 10 MG tablet Take 10 mg by mouth daily.    Yes Historical Provider, MD  diltiazem (CARDIZEM CD) 240 MG 24 hr capsule Take 1 capsule (240 mg total) by mouth daily. 12/02/14  Yes Daniel J Angiulli, PA-C  escitalopram (LEXAPRO) 20 MG tablet Take 1 tablet (  20 mg total) by mouth daily. 12/02/14  Yes Daniel J Angiulli, PA-C  famotidine (PEPCID) 20 MG tablet Take 1 tablet (20 mg total) by mouth daily. 12/02/14  Yes Daniel J Angiulli, PA-C  ferrous sulfate 325 (65 FE) MG tablet Take 1 tablet (325 mg total) by mouth 2 (two) times daily with a meal. 12/02/14  Yes Daniel J Angiulli, PA-C  furosemide (LASIX) 40 MG tablet Take 1 tablet (40 mg total) by mouth 2 (two) times daily. 12/02/14  Yes Daniel J Angiulli, PA-C  gabapentin (NEURONTIN) 300 MG capsule Take 1 capsule (300 mg total) by mouth daily. 12/02/14  Yes Daniel J Angiulli, PA-C  metolazone (ZAROXOLYN) 5 MG tablet Take 1 tablet (5 mg total) by mouth daily. 12/02/14  Yes Daniel J Angiulli, PA-C  Multiple Vitamins-Minerals (MULTIVITAMIN & MINERAL PO) Take 1 tablet by mouth daily.   Yes Historical Provider, MD  potassium chloride SA (K-DUR,KLOR-CON) 20 MEQ tablet Take 2 tablets (40 mEq total) by mouth 2 (two) times daily. 12/02/14  Yes Daniel J Angiulli, PA-C  predniSONE (DELTASONE) 5 MG tablet 8 tablets daily 1 week then 3 tablets daily 1 month then 2 tablets daily 1 month then 1 tablet daily 1 month and stop 12/02/14  Yes Daniel J Angiulli, PA-C  warfarin (COUMADIN) 2.5 MG tablet Take 1 tablet (2.5 mg total) by mouth daily at 6 PM. 12/02/14  Yes Daniel J Angiulli, PA-C  atorvastatin (LIPITOR) 10 MG tablet Take 1 tablet (10 mg total) by mouth daily. 12/13/14   Excell Seltzer, MD   Allergies  Allergen Reactions  . Pirfenidone Other (See Comments)    Congestion, breathing diffulty   CBC:    Component Value Date/Time   WBC 7.0  12/22/2014 0348   WBC 18.3* 06/18/2013 0443   HGB 12.3 12/22/2014 0348   HGB 9.1* 06/18/2013 1320   HCT 38.1 12/22/2014 0348   HCT 38.2 08/04/2014 0420   HCT 26.6* 06/18/2013 0443   PLT 195 12/22/2014 0348   PLT 324 06/18/2013 0443   MCV 103.5* 12/22/2014 0348   MCV 90 06/18/2013 0443   NEUTROABS 21.0* 12/04/2014 1839   NEUTROABS 14.5* 06/18/2013 0443   LYMPHSABS 0.7 12/04/2014 1839   LYMPHSABS 1.7 06/18/2013 0443   MONOABS 0.4 12/04/2014 1839   MONOABS 1.6* 06/18/2013 0443   EOSABS 0.0 12/04/2014 1839   EOSABS 0.3 06/18/2013 0443   BASOSABS 0.0 12/04/2014 1839   BASOSABS 0.1 06/18/2013 0443   Comprehensive Metabolic Panel:    Component Value Date/Time   NA 144 12/29/2014 0410   NA 140 10/04/2014 1618   NA 136 06/18/2013 0443   K 3.4* 12/29/2014 0410   K 3.2* 01/24/2014 1608   CL 90* 12/29/2014 0410   CL 95* 06/18/2013 0443   CO2 41* 12/29/2014 0410   CO2 38* 06/18/2013 0443   BUN 78* 12/29/2014 0410   BUN 61* 10/04/2014 1618   BUN 77* 06/18/2013 0443   CREATININE 1.70* 12/29/2014 0410   CREATININE 1.65* 09/17/2013 1646   CREATININE 1.79* 06/18/2013 0443   GLUCOSE 99 12/29/2014 0410   GLUCOSE 159* 10/04/2014 1618   GLUCOSE 117* 06/18/2013 0443   CALCIUM 9.8 12/29/2014 0410   CALCIUM 9.6 06/18/2013 0443   AST 43* 12/04/2014 1839   AST 34 06/16/2013 1334   ALT 61* 12/04/2014 1839   ALT 35 06/16/2013 1334   ALKPHOS 58 12/04/2014 1839   ALKPHOS 103 06/16/2013 1334   BILITOT 1.4* 12/04/2014 1839   BILITOT 0.3 06/16/2013 1334  PROT 6.1* 12/04/2014 1839   PROT 8.1 06/16/2013 1334   ALBUMIN 3.3* 12/04/2014 1839   ALBUMIN 3.2* 06/16/2013 1334    Physical Exam: Vital Signs: BP 109/57 mmHg  Pulse 66  Temp(Src) 97.3 F (36.3 C) (Oral)  Resp 24  Ht $R'5\' 4"'iz$  (1.626 m)  Wt 127.007 kg (280 lb)  BMI 48.04 kg/m2  SpO2 85% SpO2: SpO2: (!) 85 % O2 Device: O2 Device: Nasal Cannula O2 Flow Rate: O2 Flow Rate (L/min): 6 L/min Intake/output summary:  Intake/Output  Summary (Last 24 hours) at 12/29/14 1326 Last data filed at 12/29/14 0947  Gross per 24 hour  Intake    963 ml  Output   1400 ml  Net   -437 ml   LBM: Last BM Date: 12/25/14 Baseline Weight: Weight: 119.931 kg (264 lb 6.4 oz) Most recent weight: Weight: 127.007 kg (280 lb) (standing weight )  Exam Findings:  General: Obese female, resting in chair, pleasant and conversational in NAD. Neuro: A&O x 3, non-focal.  HEENT: [place]/AT. PERRL, sclerae anicteric. Nasal cannula in place Cardiovascular: RRR, no M/R/G.  Lungs: Respirations shallow and unlabored. BS distant with faint bibasilar crackles Abdomen: Obese, BS x 4, soft, NT/ND.  Musculoskeletal: No gross deformities, 2+ peripheral pitting edema.  Skin: Intact, warm, no rashes.         Palliative Performance Scale: 40%            Additional Data Reviewed: Recent Labs     12/28/14  0431  12/29/14  0410  NA  139  144  BUN  81*  78*  CREATININE  1.91*  1.70*     Time In: 11:15 Time Out: 12:20 Time Total: 80 Greater than 50%  of this time was spent counseling and coordinating care related to the above assessment and plan.  Signed by: Micheline Rough, MD  Micheline Rough, MD  12/29/2014, 1:26 PM  Please contact Palliative Medicine Team phone at [phone] for questions and concerns.

## 2014-12-29 NOTE — Progress Notes (Signed)
Name: [name] [name] MRN: [id number] DOB: [birth year]    ADMISSION DATE:  12/04/2014 CONSULTATION DATE:  12/29/2014  REFERRING MD :  Gwenlyn Perking  CHIEF COMPLAINT:  Dyspnea  BRIEF PATIENT DESCRIPTION: 81 y.o. F with end stage ILD (followed by Dr. Kendrick Fries) who was recently admitted by [facility] for ILD flare and parainfluenza.  She was discharged to CIR but then re-admitted by [facility] for urosepsis, AFRVR and AoC dCHF.  On 8/2, she had drop in O2 sats and increased WOB so PCCM consulted for further recs.  SIGNIFICANT EVENTS  Echo 7/7 >>>  EF 55-60%, PAP 55. 7/6/-7/14  Admitted with ILD flare and parainfluenza, d/c'd to CIR for rehab 7/24  Re-admitted with urosepsis, AFRVR and AoC dCHF. 8/17 re-consulted for refractory hypoxemia, agreed to discussion with palliative care medicine team.    SUBJECTIVE/OVERNIGHT/INTERVAL HX Feels about the same today. No acute events overnight. Oxygenating better today on 6L   VITAL SIGNS: Temp:  [97.2 F (36.2 C)-97.9 F (36.6 C)] 97.9 F (36.6 C) (08/18 0800) Pulse Rate:  [37-84] 65 (08/18 0800) Resp:  [21-29] 26 (08/18 0800) BP: (90-124)/(50-74) 103/59 mmHg (08/18 0800) SpO2:  [79 %-94 %] 89 % (08/18 1038) Weight:  [127.007 kg (280 lb)] 127.007 kg (280 lb) (08/18 0500)  PHYSICAL EXAMINATION:  General: Obese female, sitting in chair, in NAD. Neuro: A&O x 3, non-focal.  HEENT: [place]/AT. PERRL, sclerae anicteric. Cardiovascular: RRR, no M/R/G.  Lungs: Respirations shallow and unlabored.  BS distant with faint bibasilar crackles and occasional wheeze. No accessory muscle. Abdomen: Obese, BS x 4, soft, NT/ND.  Musculoskeletal: No gross deformities, 2+ peripheral pitting edema.  Skin: Intact, warm, no rashes.    Recent Labs Lab 12/27/14 0535 12/28/14 0431 12/29/14 0410  NA 141 139 144  K 2.8* 3.2* 3.4*  CL 86* 87* 90*  CO2 44* 38* 41*  BUN 78* 81* 78*  CREATININE 1.81* 1.91* 1.70*  GLUCOSE 103* 105* 99   No results for input(s): HGB, HCT, WBC, PLT in the  last 168 hours. No results found.  ASSESSMENT / PLAN:  Acute on chronic hypoxemic respiratory failure.  End stage ILD / UIP.  OSA - uses nocturnal BiPAP as outpatient (she states was previously on CPAP but has since been switched).  PAH (PAP 55 by echo from 11/17/14).  - all causing refractory hypoxemia  - prognosis is in order of few to several months   Plan: Old recommendation - see below - this is to continue without change  Nocturnal bipap only  Continue supplemental O2 as needed to maintain SpO2 -  goal pulse ox > 75% - and go with comfort  Continue steroids (don't think any role in increasing steroid dose as her worsening respiratory status is likely due to pulmonary edema / CHF vs sudden ILD flare), budesonide, levalbuterol.  Diuresis as renal function / BP permit, >> tolerating well  Incentive spirometry.  New recommendation  - consider opioids for subjective dyspnea  - Long discussion with Dr. Marchelle Gearing re: palliative care 8/17. She is open to this, and their consultation is pending.   - Disposition per primary based on palliative medicine input  Joneen Roach, AGACNP-BC [facility] Pulmonology/Critical Care Pager [phone] or [phone]  12/29/2014 11:15 AM

## 2014-12-30 ENCOUNTER — Telehealth: Payer: Self-pay | Admitting: *Deleted

## 2014-12-30 NOTE — Telephone Encounter (Signed)
Transition Care Management Follow-up Telephone Call   Date discharged? 12/29/14   How have you been since you were released from the hospital? Patient reports she does not feel any improvement.    Do you understand why you were in the hospital? yes   Do you understand the discharge instructions? yes  Where were you discharged to? Home with Hospice  Items Reviewed:  Medications reviewed: yes  Allergies reviewed: yes  Dietary changes reviewed: no  Referrals reviewed: yes, Hospice   Functional Questionnaire:   Activities of Daily Living (ADLs):   She states they are independent in the following: feeding and continence States they require assistance with the following: ambulation, bathing and hygiene, grooming, toileting and dressing   Any transportation issues/concerns?: yes, patient is unable to find transportation and is not physically able to get up and moving at this time   Any patient concerns? no   Confirmed importance and date/time of follow-up visits scheduled yes, no f/u scheduled at this time  Provider Appointment booked with  Confirmed with patient if condition begins to worsen call PCP or go to the ER.  Patient was given the office number and encouraged to call back with question or concerns.  : yes

## 2014-12-31 MED ORDER — MORPHINE SULFATE (CONCENTRATE) 20 MG/ML PO SOLN: 10.0000 mg | ORAL | Status: AC | PRN

## 2014-12-31 MED ORDER — MORPHINE SULFATE 20 MG/5ML PO SOLN: ORAL | Status: DC

## 2014-12-31 MED ORDER — LORAZEPAM 0.5 MG PO TABS: ORAL_TABLET | ORAL | Status: AC

## 2014-12-31 NOTE — Telephone Encounter (Signed)
rx signed by MD and faxed back to the pharmacy.

## 2014-12-31 NOTE — Telephone Encounter (Signed)
Spoke to Blairs from Hospice at [phone] about this patient who was discharged from the hospital to home yesterday. She has been accepted into the Hospice program but she has no orders for medications. Per the Hospice protocol, we will fax over rx for morphine and ativan today to get her through the weekend. This is sent to [facility] (phone [phone] and fax [phone]).

## 2014-12-31 NOTE — Addendum Note (Signed)
Addended by: Tonye Becket on: 12/31/2014 01:31 PM   Modules accepted: Orders, Medications

## 2014-12-31 NOTE — Telephone Encounter (Signed)
rx was faxed to pharmacy.   Pharmacy called and wanted to verify the sig and strength. Strength requested was not available.  Per Dr. Clent Ridges rx was changed. Indicated on pt medication list.

## 2015-01-02 ENCOUNTER — Telehealth: Payer: Self-pay

## 2015-01-02 NOTE — Telephone Encounter (Signed)
PLEASE NOTE: All timestamps contained within this report are represented as [place] Standard Time. CONFIDENTIALTY NOTICE: This fax transmission is intended only for the addressee. It contains information that is legally privileged, confidential or otherwise protected from use or disclosure. If you are not the intended recipient, you are strictly prohibited from reviewing, disclosing, copying using or disseminating any of this information or taking any action in reliance on or regarding this information. If you have received this fax in error, please notify us immediately by telephone so that we can arrange for its return to [place]. Phone: [phone], Toll-Free: [phone], Fax: [phone] Page: 1 of 1 Call Id: [id number] [facility] Night - Client TELEPHONE ADVICE RECORD [facility] Medical Call Center Patient Name: [name] [name] Gender: Female DOB: 01-Nov-1931 Age: 81 Y 2 M 19 D Return Phone Number: Address: City/State/Zip: [place] [facility] Night - Client Client Site [facility] [place] - Night Physician [place], [place] Contact Type Call Call Type Page Only Caller Name Verlon Au Relationship To Patient Self Is this call to report lab results? No Return Phone Number Please choose phone number Initial Comment caller is wanda ferguson - with [facility] [place] - is calling to request a page of the on call for Dr Ermalene Searing - has admitted a pt of his to hospice services and she needs comfort meds. CB# [phone] Nurse Assessment Guidelines Guideline Title Affirmed Question Affirmed Notes Nurse Date/Time Lamount Cohen Time) Disp. Time Lamount Cohen Time) Disposition Final User 12/31/2014 12:26:13 PM Send to [facility] Paging Queue Lesia Sago 12/31/2014 12:29:19 PM Paged On Call back to Call Center - PC Ulice Dash 12/31/2014 12:34:12 PM Page Completed Yes Ulice Dash After Care Instructions Given Call Event Type User Date / Time  Description Paging DoctorName Phone DateTime Result/Outcome Message Type Notes Gershon Crane [phone] 12/31/2014 12:29:19 PM Paged On Call Back to Call Center Doctor Paged Please call Morrie Sheldon at [phone]. Gershon Crane 12/31/2014 12:34:32 PM Spoke with On Call - General Message Result Provided on-call with page details.

## 2015-01-02 NOTE — Telephone Encounter (Signed)
PLEASE NOTE: All timestamps contained within this report are represented as [place] Standard Time. CONFIDENTIALTY NOTICE: This fax transmission is intended only for the addressee. It contains information that is legally privileged, confidential or otherwise protected from use or disclosure. If you are not the intended recipient, you are strictly prohibited from reviewing, disclosing, copying using or disseminating any of this information or taking any action in reliance on or regarding this information. If you have received this fax in error, please notify us immediately by telephone so that we can arrange for its return to [place]. Phone: [phone], Toll-Free: [phone], Fax: [phone] Page: 1 of 1 Call Id: [id number] [facility] Night - Client TELEPHONE ADVICE RECORD [facility] Call [facility] Patient Name: [name] [name] Gender: Female DOB: 1931-08-19 Age: 69 Y 2 M 20 D Return Phone Number: Address: City/State/Zip: [place] Client [facility] Night - Client Client Site [facility] [place] - Night Physician [place], [place] Contact Type Call Call Type Triage / Clinical Caller Name Burna Mortimer [phone] Relationship To Patient Care Giver Return Phone Number Please choose phone number Chief Complaint DEATH - has occurred or is imminent or pending Initial Comment 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 If symptomatic, describe symptoms. 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 Time was 0358am on 01/14/2015. Funeral home is unknown at this time. Has the patient traveled out of the country within the last 30 days? ---Not Applicable Does the patient require triage? ---No Please document clinical information provided and list any resource used. ---Will document and  fax. Guidelines Guideline Title Affirmed Question Affirmed Notes Nurse Date/Time (Eastern Time) Disp. Time Lamount Cohen Time) Disposition Final User 14-Jan-2015 8:56:06 AM Send to Urgent Edythe Clarity 01/14/15 8:56:18 AM Send to Urgent Edythe Clarity January 14, 2015 9:02:43 AM Clinical Call Yes Lovell Sheehan, RN, Clarisse Gouge After Care Instructions Given Call Event Type User Date / Time Description

## 2015-01-03 ENCOUNTER — Ambulatory Visit: Payer: Medicare Other | Admitting: Cardiovascular Disease

## 2015-01-12 DEATH — deceased

## 2015-04-13 DIAGNOSIS — Z515 Encounter for palliative care: Secondary | ICD-10-CM | POA: Insufficient documentation

## 2016-11-03 IMAGING — DX DG CHEST 2V
3 series · 3 of 3 positions shown · non-contrast
Comparison: Portable chest x-ray November 16, 2014

CLINICAL DATA: Acute respiratory failure and hypoxia

EXAM:
CHEST  2 VIEW

[chest lat (1 of 2)]
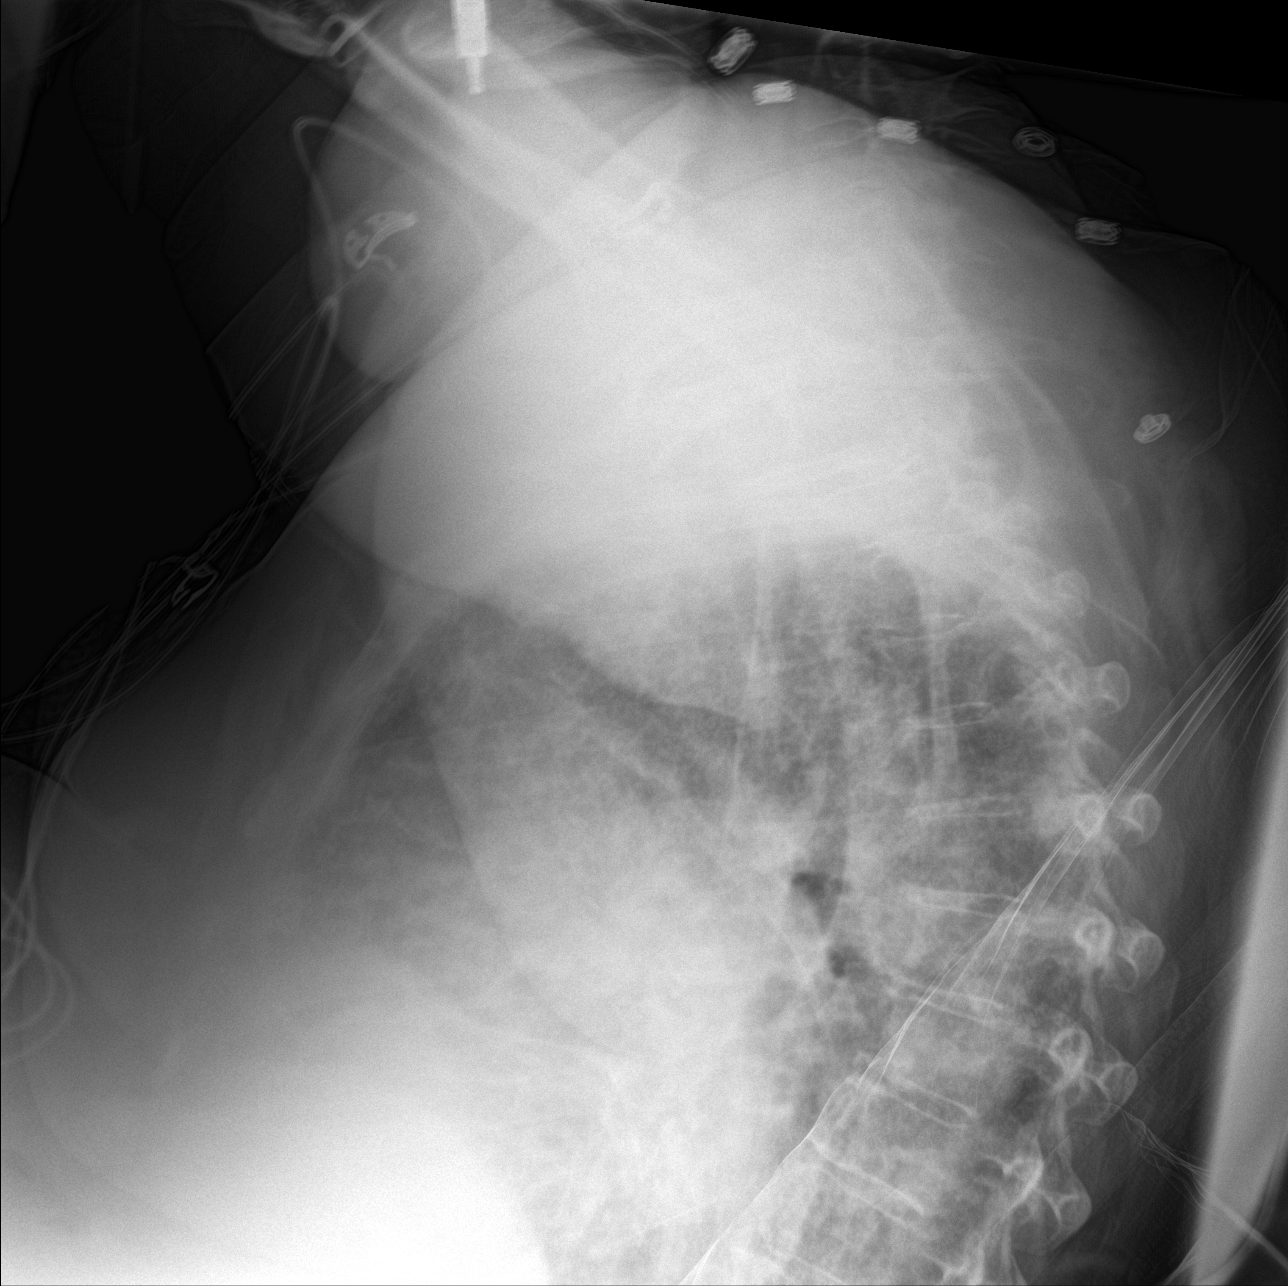

[chest ap]
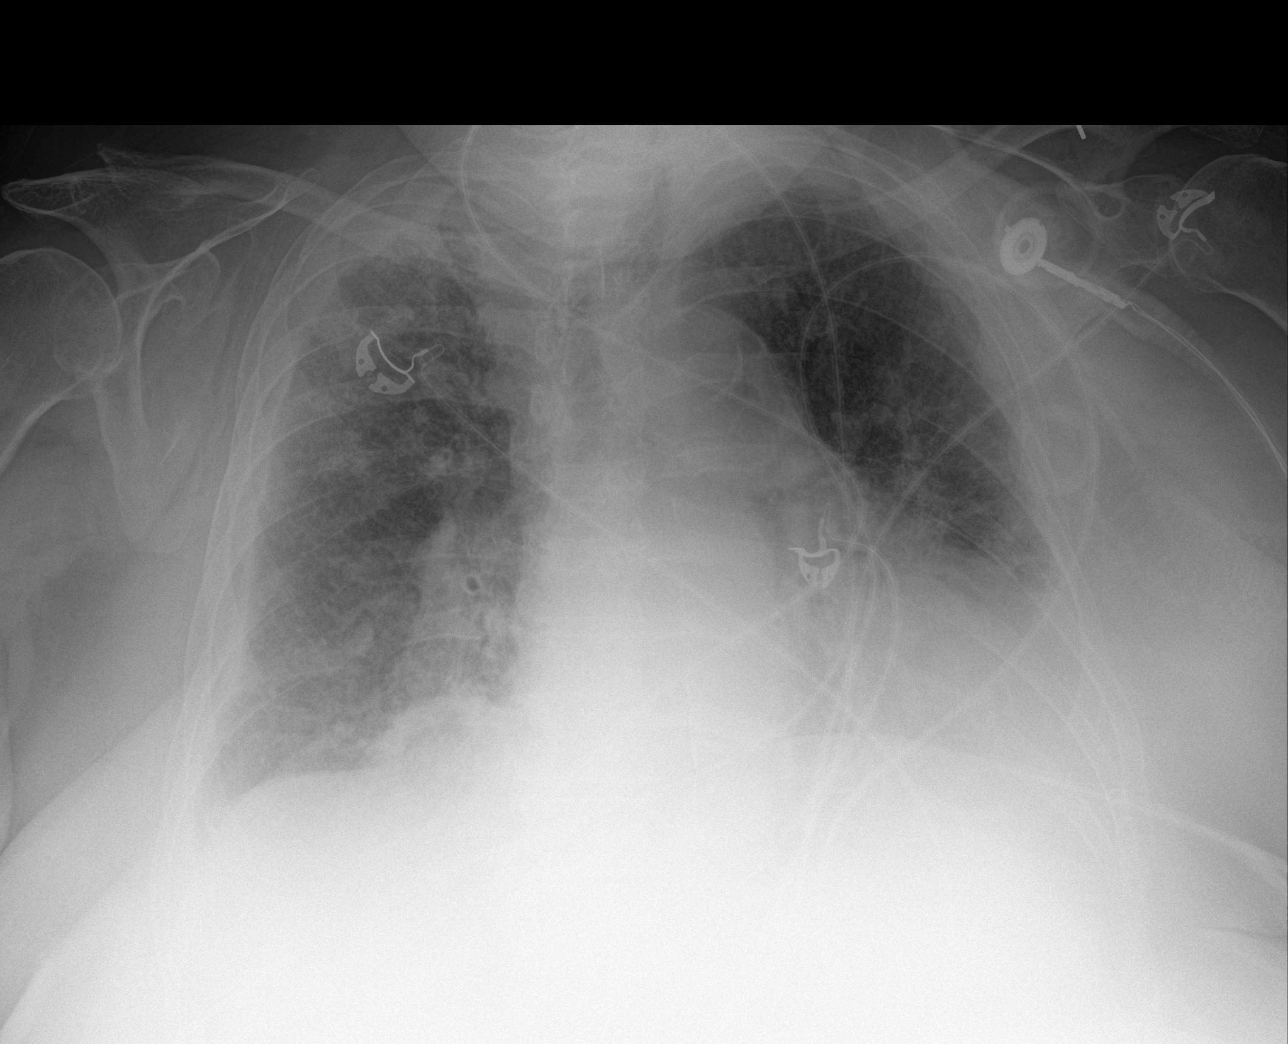

[chest lat (2 of 2)]
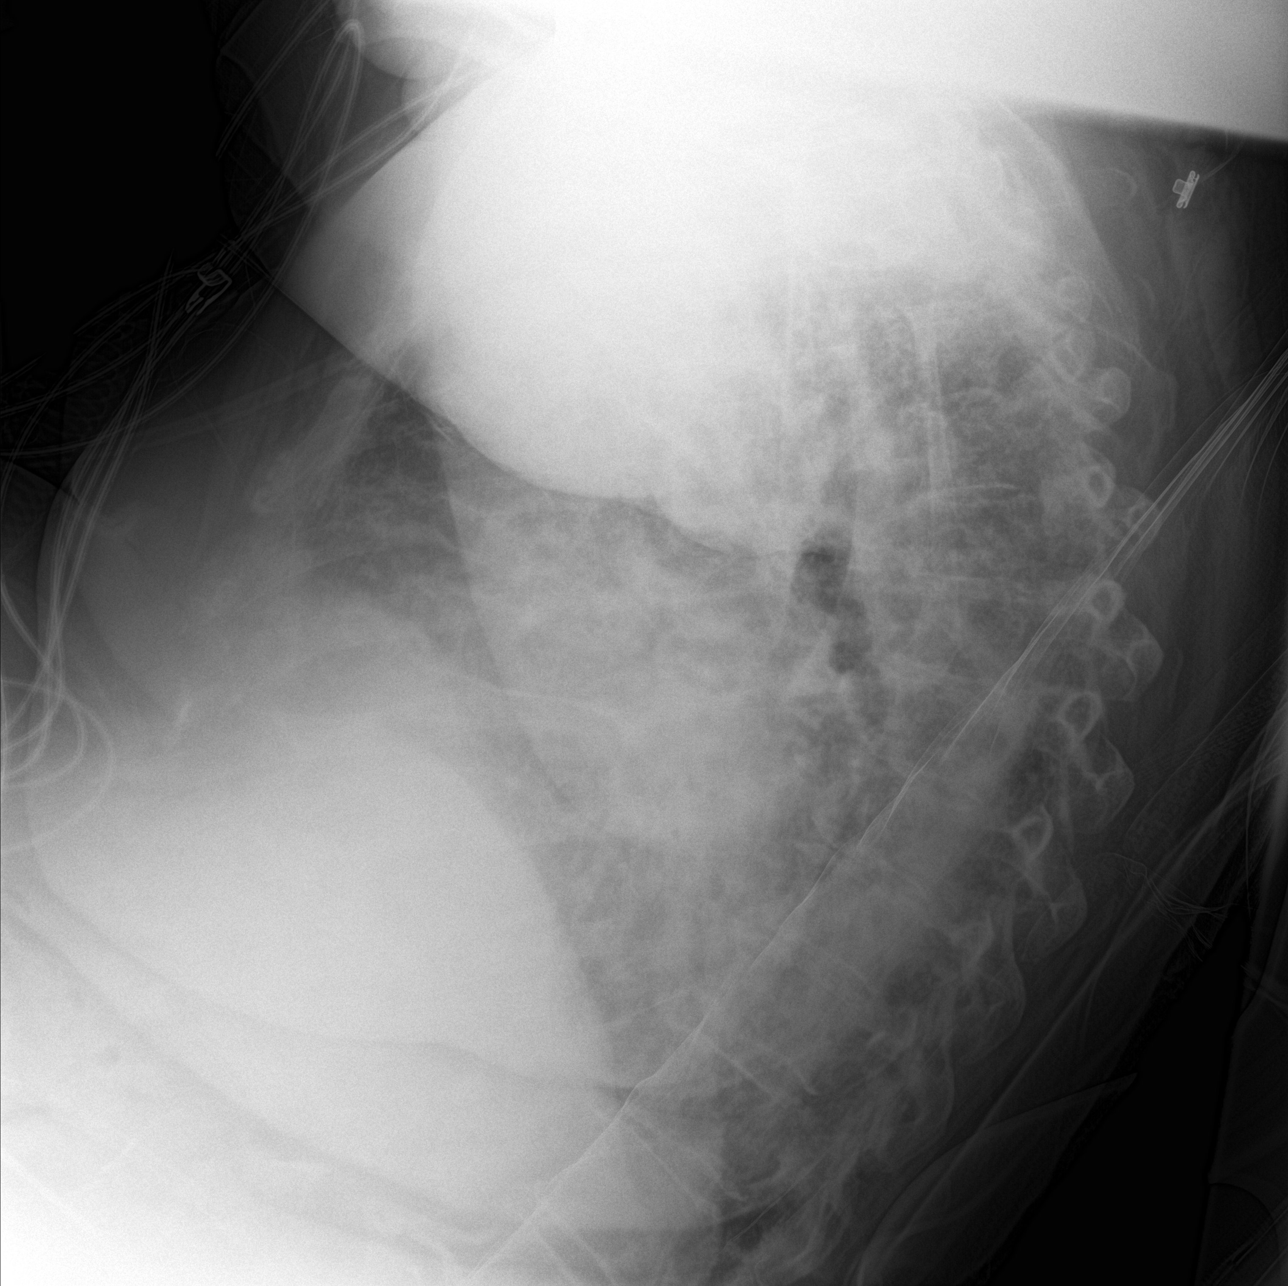

[3 of 3 positions shown; findings below may reference images not displayed]

FINDINGS: AP and lateral semi-upright views of the chest are reviewed. The
lungs are reasonably well inflated. There is pleural fluid versus
pleural thickening along the periphery of the pleural surface
surrounding the right lung. This is stable. The pulmonary
interstitial markings remain increased. There are confluent alveolar
opacities bilaterally which are less conspicuous today. The cardiac
silhouette is enlarged. The central pulmonary vascularity is
engorged but improved since yesterday's study. The trachea is
midline. There is no pneumothorax.
IMPRESSION: Slight interval improvement in the appearance of the pulmonary
vascularity and pulmonary interstitium which suggest minimal
improvement in CHF. There is no alveolar pneumonia. Chronic pleural
thickening versus pleural fluid is stable surrounding the right
lung.

## 2016-11-05 IMAGING — CR DG CHEST 1V PORT
1 series · 1 of 1 positions shown · non-contrast
Comparison: 11/17/2014

CLINICAL DATA: Lung disease

EXAM:
PORTABLE CHEST - 1 VIEW

[AP]
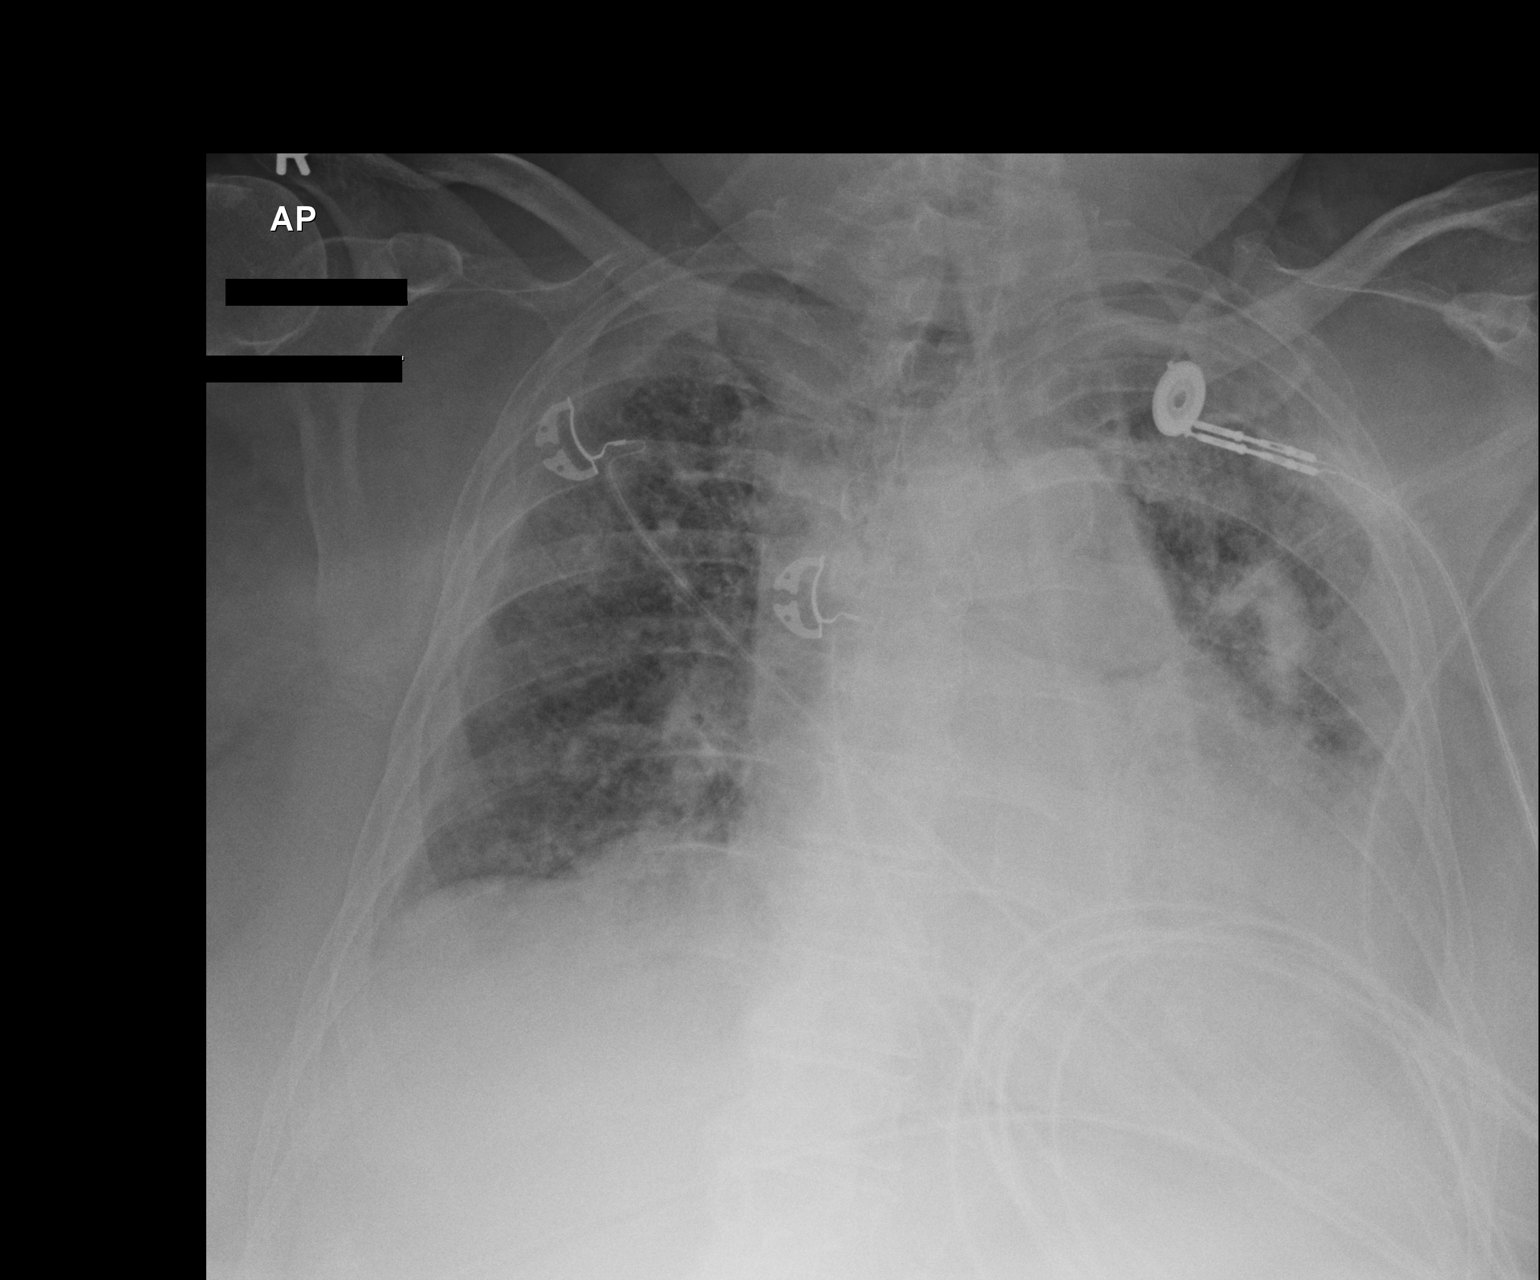

[1 of 1 positions shown; findings below may reference images not displayed]

FINDINGS: Lungs are very under aerated. Heterogeneous opacities throughout
both lungs have increased. The heart is enlarged. No pneumothorax.
IMPRESSION: Worsening patchy bilateral airspace disease.

## 2016-11-08 IMAGING — CR DG CHEST 1V PORT
1 series · 1 of 1 positions shown · non-contrast
Comparison: Portable chest x-ray Wednesday November, 2014

CLINICAL DATA: Edema and shortness of breath; acute on chronic
respiratory failure.

EXAM:
PORTABLE CHEST - 1 VIEW

[AP]
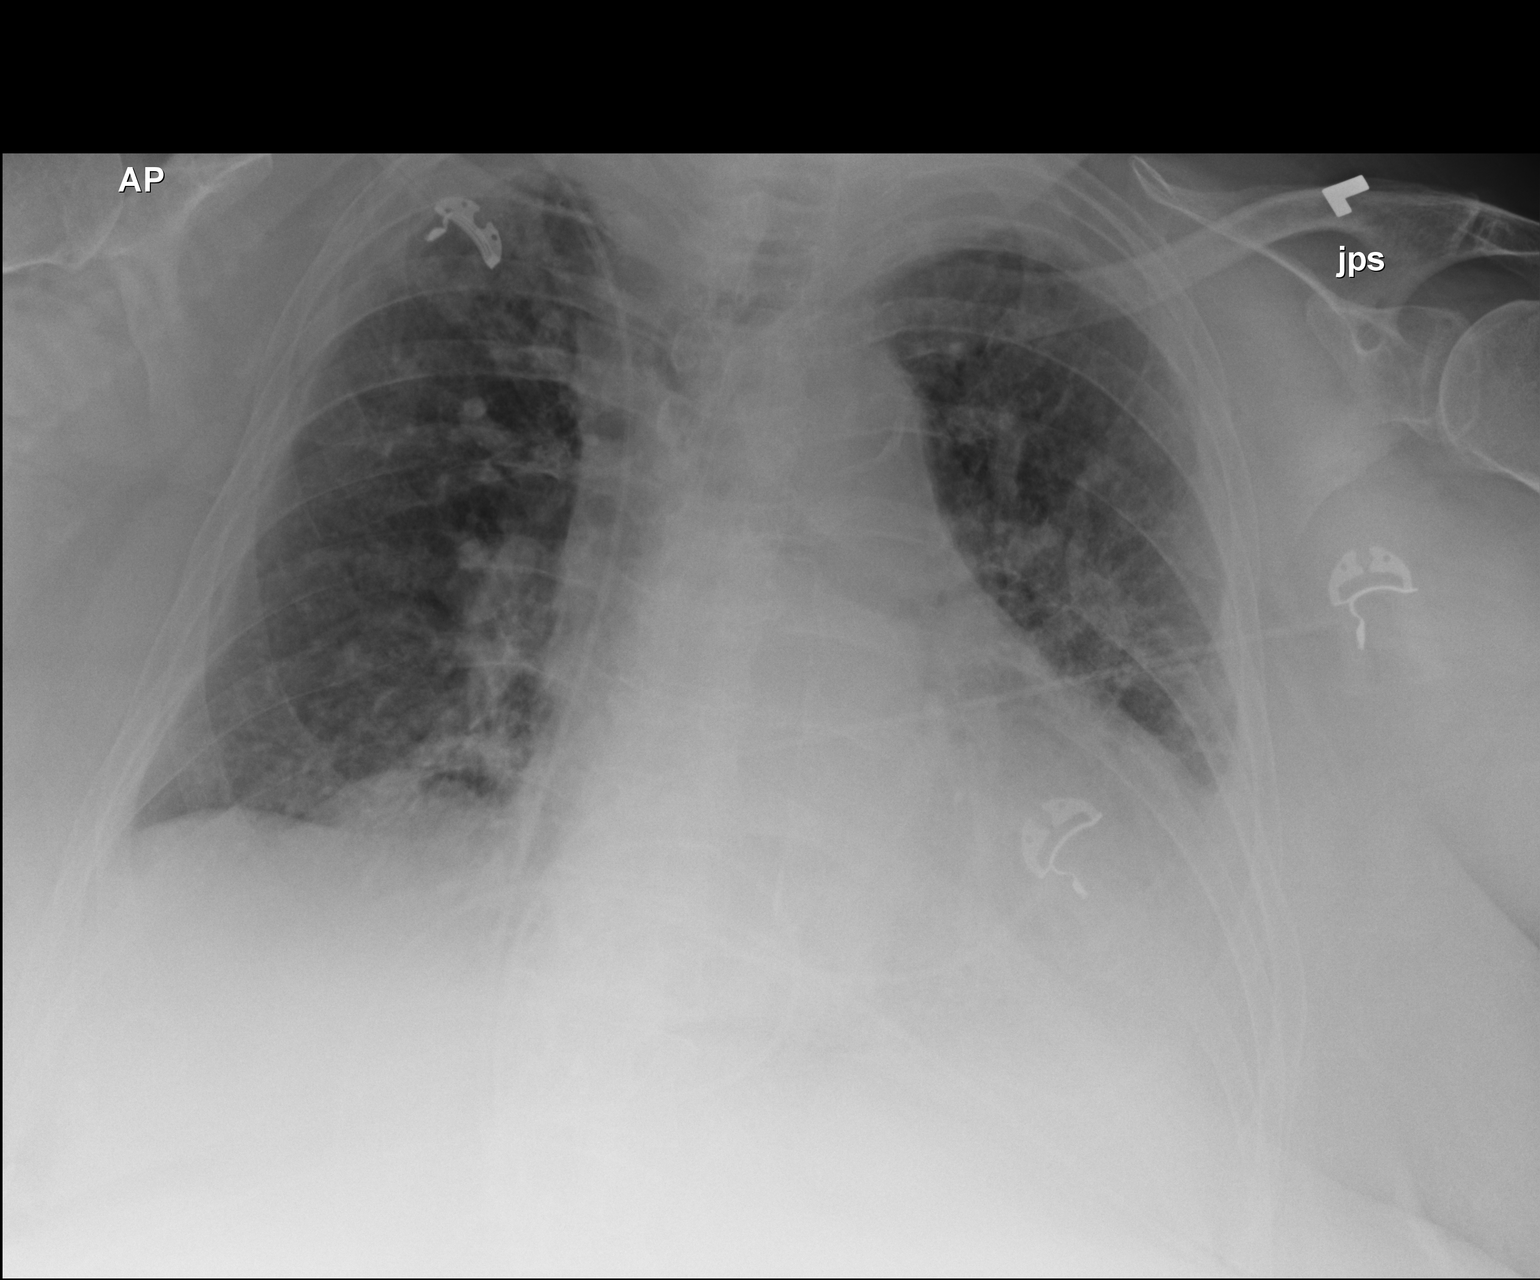

[1 of 1 positions shown; findings below may reference images not displayed]

FINDINGS: The lungs are better inflated today. There remain coarse
interstitial opacities bilaterally. The left hemidiaphragm remains
obscured. Small bilateral pleural effusions persist. The cardiac
silhouette pulmonary vascularity are engorged. The mediastinum is
less prominent in width today. The trachea is midline. The bony
thorax exhibits no acute abnormality.
IMPRESSION: There has been mild interval improvement in the appearance of the
pulmonary interstitium which may indicate improving edema or
pneumonia. There remain small bilateral pleural effusions.

## 2016-11-10 IMAGING — CR DG CHEST 1V PORT
1 series · 1 of 1 positions shown · non-contrast
Comparison: Portable chest x-ray November 22, 2014

CLINICAL DATA: Respiratory failure

EXAM:
PORTABLE CHEST - 1 VIEW

[AP]
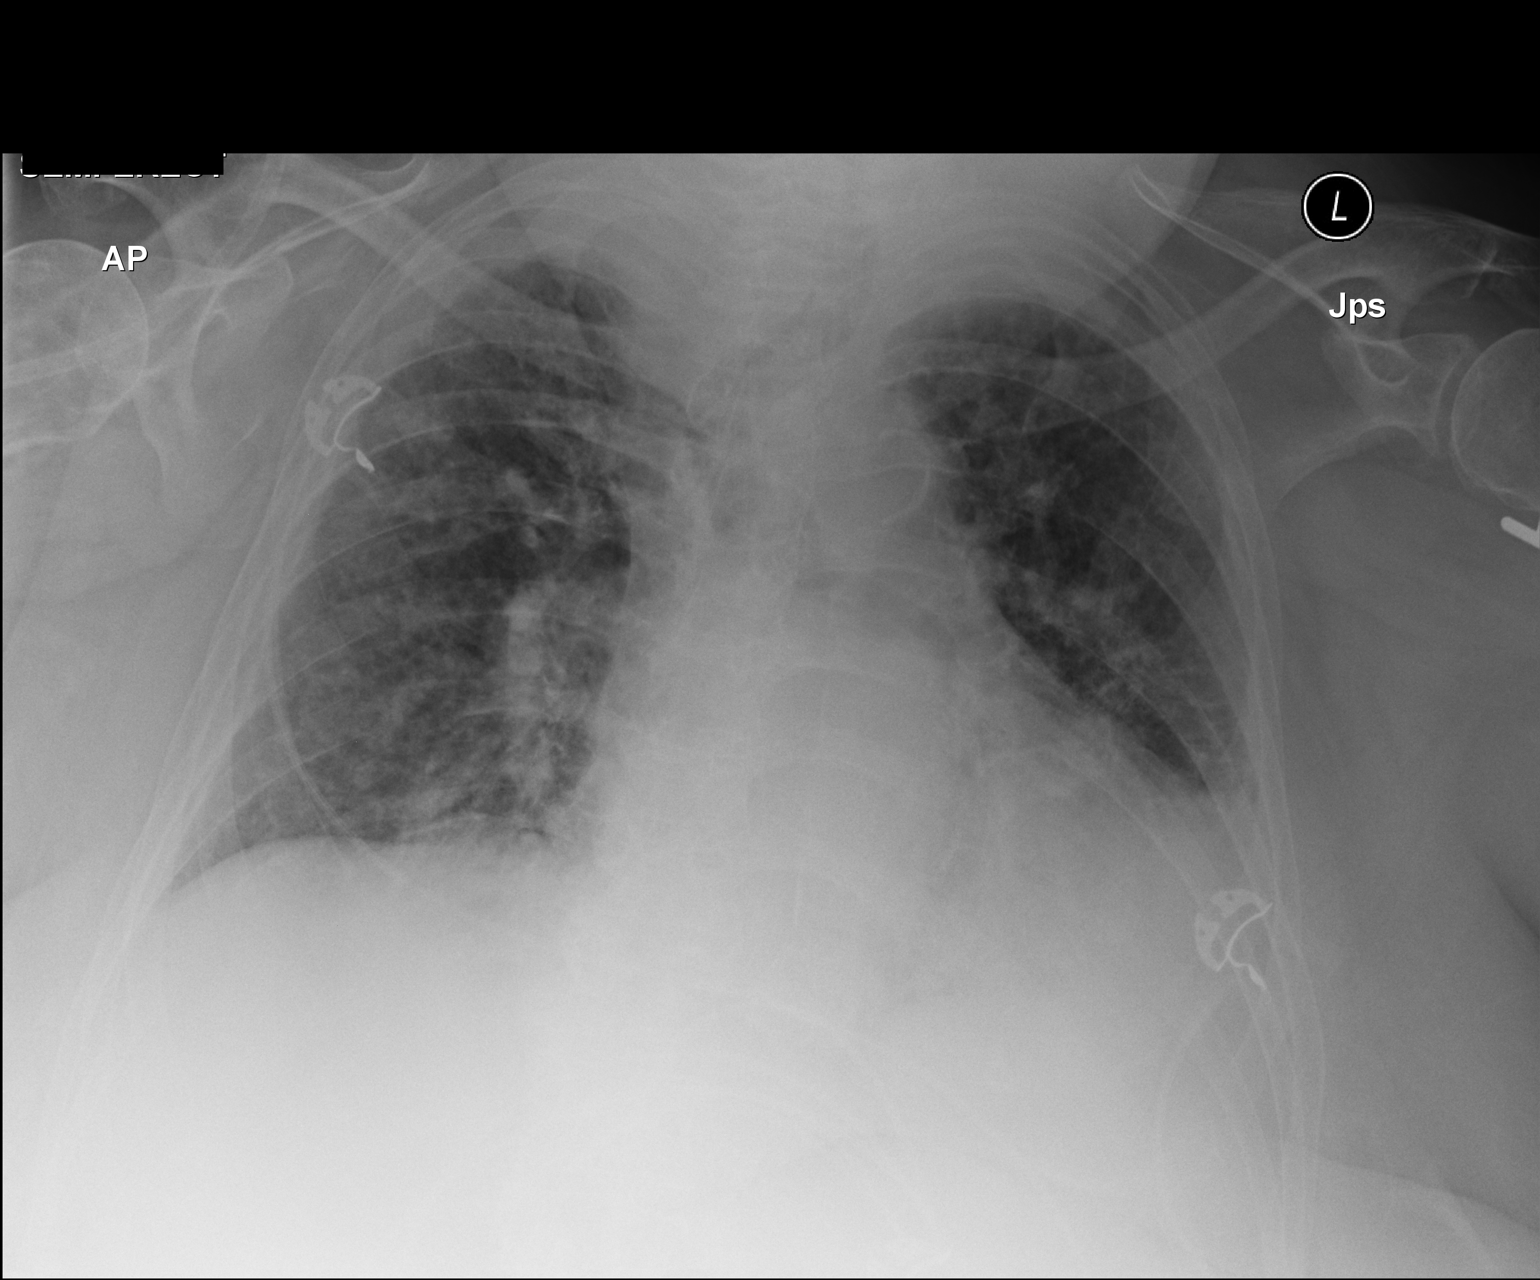

[1 of 1 positions shown; findings below may reference images not displayed]

FINDINGS: The lungs are reasonably well inflated. The interstitial markings
are coarse and remain increased bilaterally. The left lower lobe is
dense and there is obscuration of the left hemidiaphragm consistent
with pleural fluid. The cardiac silhouette is enlarged. The
pulmonary vascularity is prominent centrally. There is pleural fluid
versus pleural thickening over the superior lateral aspects of both
lungs. The bony thorax exhibits no acute abnormality.
IMPRESSION: There has not been significant change since yesterday's study. CHF
with small bilateral pleural effusions and interstitial edema and/or
bilateral interstitial pneumonia persists.
